# Patient Record
Sex: Female | Born: 1950 | Race: Black or African American | Hispanic: No | Smoking: Never smoker
Health system: Southern US, Community
[De-identification: ages and names within clinical notes are randomized; demographics above are authoritative.]

## PROBLEM LIST (undated history)

## (undated) DIAGNOSIS — R079 Chest pain, unspecified: Secondary | ICD-10-CM

## (undated) DIAGNOSIS — M51369 Other intervertebral disc degeneration, lumbar region without mention of lumbar back pain or lower extremity pain: Secondary | ICD-10-CM

## (undated) DIAGNOSIS — E785 Hyperlipidemia, unspecified: Secondary | ICD-10-CM

## (undated) DIAGNOSIS — M545 Low back pain, unspecified: Secondary | ICD-10-CM

## (undated) DIAGNOSIS — G4733 Obstructive sleep apnea (adult) (pediatric): Secondary | ICD-10-CM

## (undated) DIAGNOSIS — R2681 Unsteadiness on feet: Secondary | ICD-10-CM

## (undated) DIAGNOSIS — E119 Type 2 diabetes mellitus without complications: Secondary | ICD-10-CM

## (undated) DIAGNOSIS — K219 Gastro-esophageal reflux disease without esophagitis: Secondary | ICD-10-CM

## (undated) DIAGNOSIS — G8929 Other chronic pain: Secondary | ICD-10-CM

## (undated) DIAGNOSIS — M5136 Other intervertebral disc degeneration, lumbar region: Secondary | ICD-10-CM

## (undated) DIAGNOSIS — Z87442 Personal history of urinary calculi: Secondary | ICD-10-CM

## (undated) DIAGNOSIS — I2510 Atherosclerotic heart disease of native coronary artery without angina pectoris: Secondary | ICD-10-CM

## (undated) DIAGNOSIS — I639 Cerebral infarction, unspecified: Secondary | ICD-10-CM

## (undated) DIAGNOSIS — F32A Depression, unspecified: Secondary | ICD-10-CM

## (undated) DIAGNOSIS — F4310 Post-traumatic stress disorder, unspecified: Secondary | ICD-10-CM

## (undated) DIAGNOSIS — Z9981 Dependence on supplemental oxygen: Secondary | ICD-10-CM

## (undated) DIAGNOSIS — D509 Iron deficiency anemia, unspecified: Secondary | ICD-10-CM

## (undated) DIAGNOSIS — Z8711 Personal history of peptic ulcer disease: Secondary | ICD-10-CM

## (undated) DIAGNOSIS — I480 Paroxysmal atrial fibrillation: Secondary | ICD-10-CM

## (undated) DIAGNOSIS — T4145XA Adverse effect of unspecified anesthetic, initial encounter: Secondary | ICD-10-CM

## (undated) DIAGNOSIS — T8859XA Other complications of anesthesia, initial encounter: Secondary | ICD-10-CM

## (undated) DIAGNOSIS — K589 Irritable bowel syndrome without diarrhea: Secondary | ICD-10-CM

## (undated) DIAGNOSIS — E669 Obesity, unspecified: Secondary | ICD-10-CM

## (undated) DIAGNOSIS — Z8719 Personal history of other diseases of the digestive system: Secondary | ICD-10-CM

## (undated) DIAGNOSIS — J449 Chronic obstructive pulmonary disease, unspecified: Secondary | ICD-10-CM

## (undated) DIAGNOSIS — I10 Essential (primary) hypertension: Secondary | ICD-10-CM

## (undated) DIAGNOSIS — M1990 Unspecified osteoarthritis, unspecified site: Secondary | ICD-10-CM

## (undated) DIAGNOSIS — F329 Major depressive disorder, single episode, unspecified: Secondary | ICD-10-CM

## (undated) DIAGNOSIS — G5601 Carpal tunnel syndrome, right upper limb: Secondary | ICD-10-CM

## (undated) DIAGNOSIS — G459 Transient cerebral ischemic attack, unspecified: Secondary | ICD-10-CM

## (undated) DIAGNOSIS — N179 Acute kidney failure, unspecified: Secondary | ICD-10-CM

## (undated) DIAGNOSIS — Z9989 Dependence on other enabling machines and devices: Secondary | ICD-10-CM

## (undated) DIAGNOSIS — R32 Unspecified urinary incontinence: Secondary | ICD-10-CM

## (undated) HISTORY — DX: Atherosclerotic heart disease of native coronary artery without angina pectoris: I25.10

## (undated) HISTORY — DX: Type 2 diabetes mellitus without complications: E11.9

## (undated) HISTORY — PX: CARDIAC CATHETERIZATION: SHX172

## (undated) HISTORY — DX: Post-traumatic stress disorder, unspecified: F43.10

## (undated) HISTORY — DX: Iron deficiency anemia, unspecified: D50.9

## (undated) HISTORY — PX: CYSTOSCOPY W/ STONE MANIPULATION: SHX1427

## (undated) HISTORY — DX: Hyperlipidemia, unspecified: E78.5

## (undated) HISTORY — DX: Chest pain, unspecified: R07.9

## (undated) HISTORY — DX: Paroxysmal atrial fibrillation: I48.0

## (undated) HISTORY — DX: Major depressive disorder, single episode, unspecified: F32.9

## (undated) HISTORY — DX: Carpal tunnel syndrome, right upper limb: G56.01

## (undated) HISTORY — PX: CHOLECYSTECTOMY: SHX55

## (undated) HISTORY — PX: CYST EXCISION: SHX5701

## (undated) HISTORY — DX: Essential (primary) hypertension: I10

## (undated) HISTORY — DX: Unsteadiness on feet: R26.81

## (undated) HISTORY — PX: EXCISIONAL HEMORRHOIDECTOMY: SHX1541

## (undated) HISTORY — PX: CORONARY ANGIOPLASTY WITH STENT PLACEMENT: SHX49

## (undated) HISTORY — DX: Depression, unspecified: F32.A

## (undated) HISTORY — DX: Obesity, unspecified: E66.9

## (undated) HISTORY — DX: Unspecified osteoarthritis, unspecified site: M19.90

---

## 2000-11-22 ENCOUNTER — Observation Stay (HOSPITAL_COMMUNITY): Admission: EM | Admit: 2000-11-22 | Discharge: 2000-11-23 | Payer: Self-pay | Admitting: Emergency Medicine

## 2000-11-22 ENCOUNTER — Encounter: Payer: Self-pay | Admitting: Surgery

## 2000-12-06 ENCOUNTER — Emergency Department (HOSPITAL_COMMUNITY): Admission: EM | Admit: 2000-12-06 | Discharge: 2000-12-07 | Payer: Self-pay | Admitting: Emergency Medicine

## 2000-12-07 ENCOUNTER — Encounter: Payer: Self-pay | Admitting: *Deleted

## 2000-12-12 ENCOUNTER — Encounter: Payer: Self-pay | Admitting: Emergency Medicine

## 2000-12-12 ENCOUNTER — Emergency Department (HOSPITAL_COMMUNITY): Admission: EM | Admit: 2000-12-12 | Discharge: 2000-12-12 | Payer: Self-pay | Admitting: Emergency Medicine

## 2000-12-23 ENCOUNTER — Encounter: Payer: Self-pay | Admitting: Emergency Medicine

## 2000-12-23 ENCOUNTER — Inpatient Hospital Stay (HOSPITAL_COMMUNITY): Admission: EM | Admit: 2000-12-23 | Discharge: 2000-12-25 | Payer: Self-pay | Admitting: Emergency Medicine

## 2000-12-29 ENCOUNTER — Encounter: Admission: RE | Admit: 2000-12-29 | Discharge: 2000-12-29 | Payer: Self-pay | Admitting: Internal Medicine

## 2001-01-09 ENCOUNTER — Ambulatory Visit (HOSPITAL_COMMUNITY): Admission: RE | Admit: 2001-01-09 | Discharge: 2001-01-09 | Payer: Self-pay | Admitting: *Deleted

## 2001-01-20 ENCOUNTER — Ambulatory Visit (HOSPITAL_COMMUNITY): Admission: RE | Admit: 2001-01-20 | Discharge: 2001-01-20 | Payer: Self-pay | Admitting: *Deleted

## 2001-01-20 ENCOUNTER — Encounter: Payer: Self-pay | Admitting: *Deleted

## 2001-02-08 ENCOUNTER — Encounter: Payer: Self-pay | Admitting: Internal Medicine

## 2001-02-08 ENCOUNTER — Ambulatory Visit (HOSPITAL_BASED_OUTPATIENT_CLINIC_OR_DEPARTMENT_OTHER): Admission: RE | Admit: 2001-02-08 | Discharge: 2001-02-08 | Payer: Self-pay | Admitting: *Deleted

## 2001-04-23 ENCOUNTER — Ambulatory Visit (HOSPITAL_BASED_OUTPATIENT_CLINIC_OR_DEPARTMENT_OTHER): Admission: RE | Admit: 2001-04-23 | Discharge: 2001-04-23 | Payer: Self-pay | Admitting: Internal Medicine

## 2001-04-23 ENCOUNTER — Encounter: Payer: Self-pay | Admitting: Internal Medicine

## 2002-04-02 ENCOUNTER — Ambulatory Visit (HOSPITAL_COMMUNITY): Admission: RE | Admit: 2002-04-02 | Discharge: 2002-04-02 | Payer: Self-pay | Admitting: Gastroenterology

## 2002-04-17 ENCOUNTER — Encounter: Admission: RE | Admit: 2002-04-17 | Discharge: 2002-04-17 | Payer: Self-pay | Admitting: Gastroenterology

## 2002-04-17 ENCOUNTER — Encounter: Payer: Self-pay | Admitting: Gastroenterology

## 2002-06-19 ENCOUNTER — Encounter: Payer: Self-pay | Admitting: General Surgery

## 2002-06-26 ENCOUNTER — Observation Stay (HOSPITAL_COMMUNITY): Admission: RE | Admit: 2002-06-26 | Discharge: 2002-06-27 | Payer: Self-pay | Admitting: General Surgery

## 2002-06-26 ENCOUNTER — Encounter: Payer: Self-pay | Admitting: General Surgery

## 2002-06-26 ENCOUNTER — Encounter (INDEPENDENT_AMBULATORY_CARE_PROVIDER_SITE_OTHER): Payer: Self-pay | Admitting: *Deleted

## 2002-12-28 ENCOUNTER — Emergency Department (HOSPITAL_COMMUNITY): Admission: EM | Admit: 2002-12-28 | Discharge: 2002-12-28 | Payer: Self-pay | Admitting: Emergency Medicine

## 2002-12-28 ENCOUNTER — Encounter: Payer: Self-pay | Admitting: Emergency Medicine

## 2003-10-10 ENCOUNTER — Ambulatory Visit (HOSPITAL_COMMUNITY): Admission: RE | Admit: 2003-10-10 | Discharge: 2003-10-10 | Payer: Self-pay | Admitting: Internal Medicine

## 2003-11-17 ENCOUNTER — Encounter (HOSPITAL_COMMUNITY): Admission: RE | Admit: 2003-11-17 | Discharge: 2004-02-15 | Payer: Self-pay | Admitting: Internal Medicine

## 2003-11-25 ENCOUNTER — Ambulatory Visit (HOSPITAL_COMMUNITY): Admission: RE | Admit: 2003-11-25 | Discharge: 2003-11-25 | Payer: Self-pay | Admitting: Emergency Medicine

## 2004-01-02 ENCOUNTER — Ambulatory Visit (HOSPITAL_COMMUNITY): Admission: RE | Admit: 2004-01-02 | Discharge: 2004-01-02 | Payer: Self-pay | Admitting: Emergency Medicine

## 2004-02-18 ENCOUNTER — Encounter (HOSPITAL_COMMUNITY): Admission: RE | Admit: 2004-02-18 | Discharge: 2004-05-18 | Payer: Self-pay | Admitting: Internal Medicine

## 2004-05-19 ENCOUNTER — Encounter (HOSPITAL_COMMUNITY): Admission: RE | Admit: 2004-05-19 | Discharge: 2004-08-17 | Payer: Self-pay | Admitting: Internal Medicine

## 2004-07-28 ENCOUNTER — Ambulatory Visit: Payer: Self-pay | Admitting: Internal Medicine

## 2004-08-04 ENCOUNTER — Ambulatory Visit: Payer: Self-pay | Admitting: Internal Medicine

## 2004-08-11 ENCOUNTER — Ambulatory Visit: Payer: Self-pay | Admitting: Internal Medicine

## 2004-08-18 ENCOUNTER — Ambulatory Visit: Payer: Self-pay | Admitting: Internal Medicine

## 2004-08-19 ENCOUNTER — Encounter (HOSPITAL_COMMUNITY): Admission: RE | Admit: 2004-08-19 | Discharge: 2004-11-17 | Payer: Self-pay | Admitting: Internal Medicine

## 2004-08-25 ENCOUNTER — Ambulatory Visit: Payer: Self-pay | Admitting: Internal Medicine

## 2004-09-01 ENCOUNTER — Ambulatory Visit: Payer: Self-pay | Admitting: Internal Medicine

## 2004-09-08 ENCOUNTER — Ambulatory Visit: Payer: Self-pay | Admitting: Internal Medicine

## 2004-09-22 ENCOUNTER — Ambulatory Visit: Payer: Self-pay | Admitting: Internal Medicine

## 2004-09-29 ENCOUNTER — Ambulatory Visit: Payer: Self-pay | Admitting: Internal Medicine

## 2004-10-06 ENCOUNTER — Ambulatory Visit: Payer: Self-pay | Admitting: Internal Medicine

## 2004-10-13 ENCOUNTER — Ambulatory Visit: Payer: Self-pay | Admitting: Internal Medicine

## 2004-10-19 ENCOUNTER — Ambulatory Visit: Payer: Self-pay | Admitting: Internal Medicine

## 2004-10-27 ENCOUNTER — Ambulatory Visit: Payer: Self-pay | Admitting: Internal Medicine

## 2004-11-10 ENCOUNTER — Ambulatory Visit: Payer: Self-pay | Admitting: Internal Medicine

## 2004-11-17 ENCOUNTER — Ambulatory Visit: Payer: Self-pay | Admitting: Internal Medicine

## 2004-11-19 ENCOUNTER — Encounter (HOSPITAL_COMMUNITY): Admission: RE | Admit: 2004-11-19 | Discharge: 2005-02-17 | Payer: Self-pay | Admitting: Internal Medicine

## 2004-11-24 ENCOUNTER — Ambulatory Visit: Payer: Self-pay | Admitting: Internal Medicine

## 2004-12-01 ENCOUNTER — Ambulatory Visit: Payer: Self-pay | Admitting: Internal Medicine

## 2004-12-08 ENCOUNTER — Ambulatory Visit: Payer: Self-pay | Admitting: Internal Medicine

## 2004-12-15 ENCOUNTER — Ambulatory Visit: Payer: Self-pay | Admitting: Internal Medicine

## 2004-12-22 ENCOUNTER — Ambulatory Visit: Payer: Self-pay | Admitting: Internal Medicine

## 2004-12-29 ENCOUNTER — Ambulatory Visit: Payer: Self-pay | Admitting: Internal Medicine

## 2005-02-02 ENCOUNTER — Ambulatory Visit: Payer: Self-pay | Admitting: Internal Medicine

## 2005-02-09 ENCOUNTER — Ambulatory Visit: Payer: Self-pay | Admitting: Internal Medicine

## 2005-02-15 ENCOUNTER — Ambulatory Visit: Payer: Self-pay | Admitting: Internal Medicine

## 2005-02-18 ENCOUNTER — Encounter (HOSPITAL_COMMUNITY): Admission: RE | Admit: 2005-02-18 | Discharge: 2005-05-19 | Payer: Self-pay | Admitting: Internal Medicine

## 2005-02-23 ENCOUNTER — Ambulatory Visit: Payer: Self-pay | Admitting: Internal Medicine

## 2005-03-02 ENCOUNTER — Ambulatory Visit: Payer: Self-pay | Admitting: Internal Medicine

## 2005-03-09 ENCOUNTER — Ambulatory Visit: Payer: Self-pay | Admitting: Internal Medicine

## 2005-03-16 ENCOUNTER — Ambulatory Visit: Payer: Self-pay | Admitting: Internal Medicine

## 2005-03-17 ENCOUNTER — Ambulatory Visit: Payer: Self-pay | Admitting: Internal Medicine

## 2005-03-23 ENCOUNTER — Ambulatory Visit: Payer: Self-pay | Admitting: Internal Medicine

## 2005-03-30 ENCOUNTER — Ambulatory Visit: Payer: Self-pay | Admitting: Internal Medicine

## 2005-04-13 ENCOUNTER — Ambulatory Visit: Payer: Self-pay | Admitting: Internal Medicine

## 2005-04-20 ENCOUNTER — Ambulatory Visit: Payer: Self-pay | Admitting: Internal Medicine

## 2005-04-27 ENCOUNTER — Ambulatory Visit: Payer: Self-pay | Admitting: Internal Medicine

## 2005-05-18 ENCOUNTER — Ambulatory Visit: Payer: Self-pay | Admitting: Internal Medicine

## 2005-06-01 ENCOUNTER — Ambulatory Visit: Payer: Self-pay | Admitting: Internal Medicine

## 2005-06-15 ENCOUNTER — Ambulatory Visit: Payer: Self-pay | Admitting: Internal Medicine

## 2005-06-22 ENCOUNTER — Ambulatory Visit: Payer: Self-pay | Admitting: Internal Medicine

## 2005-06-23 ENCOUNTER — Ambulatory Visit: Payer: Self-pay | Admitting: Internal Medicine

## 2005-06-29 ENCOUNTER — Ambulatory Visit: Payer: Self-pay | Admitting: Internal Medicine

## 2005-07-06 ENCOUNTER — Ambulatory Visit: Payer: Self-pay | Admitting: Internal Medicine

## 2005-07-20 ENCOUNTER — Ambulatory Visit: Payer: Self-pay | Admitting: Internal Medicine

## 2005-07-27 ENCOUNTER — Ambulatory Visit: Payer: Self-pay | Admitting: Internal Medicine

## 2005-08-03 ENCOUNTER — Ambulatory Visit: Payer: Self-pay | Admitting: Internal Medicine

## 2005-08-10 ENCOUNTER — Ambulatory Visit: Payer: Self-pay | Admitting: Internal Medicine

## 2005-08-17 ENCOUNTER — Ambulatory Visit: Payer: Self-pay | Admitting: Internal Medicine

## 2005-08-24 ENCOUNTER — Ambulatory Visit: Payer: Self-pay | Admitting: Internal Medicine

## 2005-09-21 ENCOUNTER — Ambulatory Visit: Payer: Self-pay | Admitting: Internal Medicine

## 2005-09-28 ENCOUNTER — Ambulatory Visit: Payer: Self-pay | Admitting: Internal Medicine

## 2005-10-05 ENCOUNTER — Ambulatory Visit: Payer: Self-pay | Admitting: Internal Medicine

## 2005-10-12 ENCOUNTER — Ambulatory Visit: Payer: Self-pay | Admitting: Internal Medicine

## 2005-10-13 ENCOUNTER — Ambulatory Visit: Payer: Self-pay | Admitting: Internal Medicine

## 2005-10-19 ENCOUNTER — Ambulatory Visit: Payer: Self-pay | Admitting: Internal Medicine

## 2005-10-26 ENCOUNTER — Ambulatory Visit: Payer: Self-pay | Admitting: Internal Medicine

## 2005-11-02 ENCOUNTER — Ambulatory Visit: Payer: Self-pay | Admitting: Internal Medicine

## 2005-11-09 ENCOUNTER — Ambulatory Visit: Payer: Self-pay | Admitting: Internal Medicine

## 2005-11-16 ENCOUNTER — Ambulatory Visit: Payer: Self-pay | Admitting: Internal Medicine

## 2005-11-23 ENCOUNTER — Ambulatory Visit: Payer: Self-pay | Admitting: Internal Medicine

## 2005-11-30 ENCOUNTER — Ambulatory Visit: Payer: Self-pay | Admitting: Internal Medicine

## 2005-12-07 ENCOUNTER — Ambulatory Visit: Payer: Self-pay | Admitting: Internal Medicine

## 2005-12-14 ENCOUNTER — Ambulatory Visit: Payer: Self-pay | Admitting: Internal Medicine

## 2005-12-21 ENCOUNTER — Ambulatory Visit: Payer: Self-pay | Admitting: Internal Medicine

## 2006-01-04 ENCOUNTER — Ambulatory Visit: Payer: Self-pay | Admitting: Internal Medicine

## 2006-01-11 ENCOUNTER — Ambulatory Visit: Payer: Self-pay | Admitting: Internal Medicine

## 2006-01-18 ENCOUNTER — Ambulatory Visit: Payer: Self-pay | Admitting: Internal Medicine

## 2006-01-25 ENCOUNTER — Ambulatory Visit: Payer: Self-pay | Admitting: Internal Medicine

## 2006-02-01 ENCOUNTER — Ambulatory Visit: Payer: Self-pay | Admitting: Internal Medicine

## 2006-02-15 ENCOUNTER — Ambulatory Visit: Payer: Self-pay | Admitting: Internal Medicine

## 2006-02-22 ENCOUNTER — Ambulatory Visit: Payer: Self-pay | Admitting: Internal Medicine

## 2006-02-23 ENCOUNTER — Ambulatory Visit: Payer: Self-pay | Admitting: Internal Medicine

## 2006-03-01 ENCOUNTER — Ambulatory Visit: Payer: Self-pay | Admitting: Internal Medicine

## 2006-03-08 ENCOUNTER — Ambulatory Visit: Payer: Self-pay | Admitting: Internal Medicine

## 2006-03-15 ENCOUNTER — Ambulatory Visit: Payer: Self-pay | Admitting: Internal Medicine

## 2006-03-22 ENCOUNTER — Ambulatory Visit: Payer: Self-pay | Admitting: Internal Medicine

## 2006-03-29 ENCOUNTER — Ambulatory Visit: Payer: Self-pay | Admitting: Internal Medicine

## 2006-04-05 ENCOUNTER — Ambulatory Visit: Payer: Self-pay | Admitting: Internal Medicine

## 2006-04-12 ENCOUNTER — Ambulatory Visit: Payer: Self-pay | Admitting: Internal Medicine

## 2006-04-19 ENCOUNTER — Ambulatory Visit: Payer: Self-pay | Admitting: Internal Medicine

## 2006-04-26 ENCOUNTER — Ambulatory Visit: Payer: Self-pay | Admitting: Internal Medicine

## 2006-05-03 ENCOUNTER — Ambulatory Visit: Payer: Self-pay | Admitting: Internal Medicine

## 2006-05-12 ENCOUNTER — Ambulatory Visit: Payer: Self-pay | Admitting: Internal Medicine

## 2006-05-17 ENCOUNTER — Ambulatory Visit: Payer: Self-pay | Admitting: Internal Medicine

## 2006-05-24 ENCOUNTER — Ambulatory Visit: Payer: Self-pay | Admitting: Internal Medicine

## 2006-05-31 ENCOUNTER — Ambulatory Visit: Payer: Self-pay | Admitting: Internal Medicine

## 2006-06-07 ENCOUNTER — Ambulatory Visit: Payer: Self-pay | Admitting: Internal Medicine

## 2006-06-14 ENCOUNTER — Ambulatory Visit: Payer: Self-pay | Admitting: Internal Medicine

## 2006-06-21 ENCOUNTER — Ambulatory Visit: Payer: Self-pay | Admitting: Internal Medicine

## 2006-06-28 ENCOUNTER — Ambulatory Visit: Payer: Self-pay | Admitting: Internal Medicine

## 2006-06-29 ENCOUNTER — Ambulatory Visit: Payer: Self-pay | Admitting: Internal Medicine

## 2006-07-05 ENCOUNTER — Ambulatory Visit: Payer: Self-pay | Admitting: Internal Medicine

## 2006-07-26 ENCOUNTER — Ambulatory Visit: Payer: Self-pay | Admitting: Internal Medicine

## 2006-08-02 ENCOUNTER — Ambulatory Visit: Payer: Self-pay | Admitting: Internal Medicine

## 2006-08-10 ENCOUNTER — Ambulatory Visit: Payer: Self-pay | Admitting: Internal Medicine

## 2006-08-16 ENCOUNTER — Ambulatory Visit: Payer: Self-pay | Admitting: Internal Medicine

## 2006-08-25 ENCOUNTER — Ambulatory Visit: Payer: Self-pay | Admitting: Internal Medicine

## 2006-08-30 ENCOUNTER — Ambulatory Visit: Payer: Self-pay | Admitting: Internal Medicine

## 2006-09-06 ENCOUNTER — Ambulatory Visit: Payer: Self-pay | Admitting: Internal Medicine

## 2006-09-14 ENCOUNTER — Ambulatory Visit: Payer: Self-pay | Admitting: Internal Medicine

## 2006-09-20 ENCOUNTER — Ambulatory Visit: Payer: Self-pay | Admitting: Internal Medicine

## 2006-09-27 ENCOUNTER — Ambulatory Visit: Payer: Self-pay | Admitting: Internal Medicine

## 2006-10-18 ENCOUNTER — Ambulatory Visit: Payer: Self-pay | Admitting: Internal Medicine

## 2006-10-26 ENCOUNTER — Ambulatory Visit: Payer: Self-pay | Admitting: Internal Medicine

## 2006-11-01 ENCOUNTER — Ambulatory Visit: Payer: Self-pay | Admitting: Internal Medicine

## 2006-11-08 ENCOUNTER — Ambulatory Visit: Payer: Self-pay | Admitting: Internal Medicine

## 2006-11-15 ENCOUNTER — Ambulatory Visit: Payer: Self-pay | Admitting: Internal Medicine

## 2006-11-19 ENCOUNTER — Ambulatory Visit (HOSPITAL_COMMUNITY): Admission: RE | Admit: 2006-11-19 | Discharge: 2006-11-19 | Payer: Self-pay | Admitting: Family Medicine

## 2006-11-22 ENCOUNTER — Ambulatory Visit: Payer: Self-pay | Admitting: Internal Medicine

## 2006-11-27 ENCOUNTER — Inpatient Hospital Stay (HOSPITAL_COMMUNITY): Admission: EM | Admit: 2006-11-27 | Discharge: 2006-12-02 | Payer: Self-pay | Admitting: Emergency Medicine

## 2006-11-27 ENCOUNTER — Ambulatory Visit: Payer: Self-pay | Admitting: Sports Medicine

## 2006-11-28 ENCOUNTER — Encounter (INDEPENDENT_AMBULATORY_CARE_PROVIDER_SITE_OTHER): Payer: Self-pay | Admitting: Interventional Cardiology

## 2006-12-08 ENCOUNTER — Ambulatory Visit: Payer: Self-pay | Admitting: Internal Medicine

## 2006-12-13 ENCOUNTER — Ambulatory Visit: Payer: Self-pay | Admitting: Internal Medicine

## 2006-12-19 ENCOUNTER — Ambulatory Visit: Payer: Self-pay | Admitting: Internal Medicine

## 2007-01-03 ENCOUNTER — Ambulatory Visit: Payer: Self-pay | Admitting: Internal Medicine

## 2007-01-12 ENCOUNTER — Ambulatory Visit: Payer: Self-pay | Admitting: Internal Medicine

## 2007-01-17 ENCOUNTER — Ambulatory Visit: Payer: Self-pay | Admitting: Internal Medicine

## 2007-01-24 ENCOUNTER — Ambulatory Visit: Payer: Self-pay | Admitting: Internal Medicine

## 2007-01-29 ENCOUNTER — Inpatient Hospital Stay (HOSPITAL_COMMUNITY): Admission: EM | Admit: 2007-01-29 | Discharge: 2007-02-02 | Payer: Self-pay | Admitting: Emergency Medicine

## 2007-01-30 ENCOUNTER — Encounter (INDEPENDENT_AMBULATORY_CARE_PROVIDER_SITE_OTHER): Payer: Self-pay | Admitting: Emergency Medicine

## 2007-01-30 DIAGNOSIS — I2510 Atherosclerotic heart disease of native coronary artery without angina pectoris: Secondary | ICD-10-CM

## 2007-01-30 HISTORY — DX: Atherosclerotic heart disease of native coronary artery without angina pectoris: I25.10

## 2007-02-07 ENCOUNTER — Ambulatory Visit: Payer: Self-pay | Admitting: Internal Medicine

## 2007-02-14 ENCOUNTER — Ambulatory Visit: Payer: Self-pay | Admitting: Internal Medicine

## 2007-02-16 ENCOUNTER — Encounter (HOSPITAL_COMMUNITY): Admission: RE | Admit: 2007-02-16 | Discharge: 2007-05-17 | Payer: Self-pay | Admitting: *Deleted

## 2007-02-21 ENCOUNTER — Ambulatory Visit: Payer: Self-pay | Admitting: Internal Medicine

## 2007-02-28 ENCOUNTER — Ambulatory Visit: Payer: Self-pay | Admitting: Internal Medicine

## 2007-03-07 ENCOUNTER — Ambulatory Visit: Payer: Self-pay | Admitting: Internal Medicine

## 2007-03-15 ENCOUNTER — Ambulatory Visit: Payer: Self-pay | Admitting: Internal Medicine

## 2007-04-04 ENCOUNTER — Ambulatory Visit: Payer: Self-pay | Admitting: Internal Medicine

## 2007-04-12 ENCOUNTER — Ambulatory Visit: Payer: Self-pay | Admitting: Internal Medicine

## 2007-04-20 ENCOUNTER — Ambulatory Visit: Payer: Self-pay | Admitting: Internal Medicine

## 2007-04-26 ENCOUNTER — Ambulatory Visit: Payer: Self-pay | Admitting: Internal Medicine

## 2007-04-27 ENCOUNTER — Ambulatory Visit: Payer: Self-pay | Admitting: Internal Medicine

## 2007-05-10 DIAGNOSIS — J4520 Mild intermittent asthma, uncomplicated: Secondary | ICD-10-CM | POA: Insufficient documentation

## 2007-05-10 DIAGNOSIS — K219 Gastro-esophageal reflux disease without esophagitis: Secondary | ICD-10-CM | POA: Insufficient documentation

## 2007-05-10 DIAGNOSIS — I10 Essential (primary) hypertension: Secondary | ICD-10-CM | POA: Insufficient documentation

## 2007-05-10 DIAGNOSIS — I2510 Atherosclerotic heart disease of native coronary artery without angina pectoris: Secondary | ICD-10-CM | POA: Insufficient documentation

## 2007-05-10 DIAGNOSIS — J302 Other seasonal allergic rhinitis: Secondary | ICD-10-CM | POA: Insufficient documentation

## 2007-05-10 DIAGNOSIS — J3089 Other allergic rhinitis: Secondary | ICD-10-CM

## 2007-05-10 DIAGNOSIS — G4733 Obstructive sleep apnea (adult) (pediatric): Secondary | ICD-10-CM | POA: Insufficient documentation

## 2007-05-10 DIAGNOSIS — Z9861 Coronary angioplasty status: Secondary | ICD-10-CM

## 2007-05-11 ENCOUNTER — Ambulatory Visit: Payer: Self-pay | Admitting: Internal Medicine

## 2007-05-16 ENCOUNTER — Ambulatory Visit: Payer: Self-pay | Admitting: Internal Medicine

## 2007-05-18 ENCOUNTER — Encounter (HOSPITAL_COMMUNITY): Admission: RE | Admit: 2007-05-18 | Discharge: 2007-05-26 | Payer: Self-pay | Admitting: *Deleted

## 2007-05-26 ENCOUNTER — Inpatient Hospital Stay (HOSPITAL_COMMUNITY): Admission: EM | Admit: 2007-05-26 | Discharge: 2007-05-30 | Payer: Self-pay | Admitting: Emergency Medicine

## 2007-06-06 ENCOUNTER — Ambulatory Visit: Payer: Self-pay | Admitting: Internal Medicine

## 2007-06-08 ENCOUNTER — Ambulatory Visit: Payer: Self-pay | Admitting: Hematology and Oncology

## 2007-06-13 ENCOUNTER — Ambulatory Visit: Payer: Self-pay | Admitting: Internal Medicine

## 2007-06-19 ENCOUNTER — Ambulatory Visit: Payer: Self-pay | Admitting: Internal Medicine

## 2007-06-20 ENCOUNTER — Ambulatory Visit: Payer: Self-pay | Admitting: Internal Medicine

## 2007-06-23 LAB — CBC & DIFF AND RETIC
Basophils Absolute: 0.1 10*3/uL (ref 0.0–0.1)
HCT: 29.9 % — ABNORMAL LOW (ref 34.8–46.6)
HGB: 9.7 g/dL — ABNORMAL LOW (ref 11.6–15.9)
IRF: 0.35 — ABNORMAL HIGH (ref 0.130–0.330)
MONO#: 0.6 10*3/uL (ref 0.1–0.9)
NEUT#: 3.2 10*3/uL (ref 1.5–6.5)
NEUT%: 42.4 % (ref 39.6–76.8)
WBC: 7.6 10*3/uL (ref 3.9–10.0)
lymph#: 3.6 10*3/uL — ABNORMAL HIGH (ref 0.9–3.3)

## 2007-06-23 LAB — URINALYSIS, MICROSCOPIC - CHCC
Ketones: NEGATIVE mg/dL
Leukocyte Esterase: NEGATIVE
Nitrite: NEGATIVE
Protein: NEGATIVE mg/dL
WBC, UA: NEGATIVE (ref 0–2)

## 2007-06-23 LAB — MORPHOLOGY

## 2007-06-27 LAB — COMPREHENSIVE METABOLIC PANEL
Albumin: 4.1 g/dL (ref 3.5–5.2)
CO2: 28 mEq/L (ref 19–32)
Calcium: 9.4 mg/dL (ref 8.4–10.5)
Chloride: 102 mEq/L (ref 96–112)
Glucose, Bld: 114 mg/dL — ABNORMAL HIGH (ref 70–99)
Potassium: 4.1 mEq/L (ref 3.5–5.3)
Sodium: 141 mEq/L (ref 135–145)
Total Bilirubin: 0.3 mg/dL (ref 0.3–1.2)
Total Protein: 7.3 g/dL (ref 6.0–8.3)

## 2007-06-27 LAB — DIRECT ANTIGLOBULIN TEST (NOT AT ARMC)
DAT (Complement): NEGATIVE
DAT IgG: NEGATIVE

## 2007-06-27 LAB — HEMOGLOBINOPATHY EVALUATION
Hgb A2 Quant: 2.1 % — ABNORMAL LOW (ref 2.2–3.2)
Hgb A: 97.9 % — ABNORMAL HIGH (ref 96.8–97.8)

## 2007-06-27 LAB — HAPTOGLOBIN: Haptoglobin: 137 mg/dL (ref 16–200)

## 2007-06-27 LAB — PROTEIN ELECTROPHORESIS, SERUM
Alpha-1-Globulin: 4.4 % (ref 2.9–4.9)
Alpha-2-Globulin: 11.6 % (ref 7.1–11.8)
Beta Globulin: 7 % (ref 4.7–7.2)
Gamma Globulin: 15.2 % (ref 11.1–18.8)

## 2007-06-27 LAB — IRON AND TIBC
Iron: 30 ug/dL — ABNORMAL LOW (ref 42–145)
UIBC: 315 ug/dL

## 2007-06-27 LAB — VITAMIN B12: Vitamin B-12: 705 pg/mL (ref 211–911)

## 2007-07-05 ENCOUNTER — Ambulatory Visit: Payer: Self-pay | Admitting: Internal Medicine

## 2007-07-11 ENCOUNTER — Ambulatory Visit: Payer: Self-pay | Admitting: Internal Medicine

## 2007-07-19 ENCOUNTER — Ambulatory Visit: Payer: Self-pay | Admitting: Hematology and Oncology

## 2007-07-25 ENCOUNTER — Ambulatory Visit: Payer: Self-pay | Admitting: Internal Medicine

## 2007-07-25 LAB — BASIC METABOLIC PANEL
CO2: 26 mEq/L (ref 19–32)
Glucose, Bld: 108 mg/dL — ABNORMAL HIGH (ref 70–99)
Potassium: 4.1 mEq/L (ref 3.5–5.3)
Sodium: 142 mEq/L (ref 135–145)

## 2007-07-25 LAB — CBC WITH DIFFERENTIAL/PLATELET
Eosinophils Absolute: 0.3 10*3/uL (ref 0.0–0.5)
HGB: 10.8 g/dL — ABNORMAL LOW (ref 11.6–15.9)
LYMPH%: 38.4 % (ref 14.0–48.0)
MONO#: 0.6 10*3/uL (ref 0.1–0.9)
NEUT#: 3.3 10*3/uL (ref 1.5–6.5)
Platelets: 427 10*3/uL — ABNORMAL HIGH (ref 145–400)
RBC: 4.93 10*6/uL (ref 3.70–5.32)
RDW: 21.7 % — ABNORMAL HIGH (ref 11.3–14.5)
WBC: 7.3 10*3/uL (ref 3.9–10.0)

## 2007-07-25 LAB — FERRITIN: Ferritin: 807 ng/mL — ABNORMAL HIGH (ref 10–291)

## 2007-07-25 LAB — IRON AND TIBC: %SAT: 20 % (ref 20–55)

## 2007-08-01 ENCOUNTER — Ambulatory Visit: Payer: Self-pay | Admitting: Internal Medicine

## 2007-08-22 ENCOUNTER — Ambulatory Visit: Payer: Self-pay | Admitting: Internal Medicine

## 2007-08-29 ENCOUNTER — Ambulatory Visit: Payer: Self-pay | Admitting: Internal Medicine

## 2007-09-05 ENCOUNTER — Ambulatory Visit: Payer: Self-pay | Admitting: Internal Medicine

## 2007-09-13 ENCOUNTER — Ambulatory Visit: Payer: Self-pay | Admitting: Internal Medicine

## 2007-09-26 ENCOUNTER — Ambulatory Visit: Payer: Self-pay | Admitting: Internal Medicine

## 2007-10-03 ENCOUNTER — Ambulatory Visit: Payer: Self-pay | Admitting: Internal Medicine

## 2007-10-04 ENCOUNTER — Ambulatory Visit: Payer: Self-pay | Admitting: Hematology and Oncology

## 2007-10-06 LAB — CBC WITH DIFFERENTIAL/PLATELET
Eosinophils Absolute: 0.2 10*3/uL (ref 0.0–0.5)
MCV: 68.3 fL — ABNORMAL LOW (ref 81.0–101.0)
MONO#: 0.8 10*3/uL (ref 0.1–0.9)
MONO%: 10.3 % (ref 0.0–13.0)
NEUT#: 2.9 10*3/uL (ref 1.5–6.5)
RBC: 5.1 10*6/uL (ref 3.70–5.32)
RDW: 17 % — ABNORMAL HIGH (ref 11.3–14.5)
WBC: 7.5 10*3/uL (ref 3.9–10.0)

## 2007-10-06 LAB — IRON AND TIBC
%SAT: 23 % (ref 20–55)
Iron: 65 ug/dL (ref 42–145)

## 2007-10-06 LAB — BASIC METABOLIC PANEL
Glucose, Bld: 97 mg/dL (ref 70–99)
Potassium: 3.9 mEq/L (ref 3.5–5.3)
Sodium: 140 mEq/L (ref 135–145)

## 2007-10-06 LAB — FERRITIN: Ferritin: 363 ng/mL — ABNORMAL HIGH (ref 10–291)

## 2007-10-10 ENCOUNTER — Ambulatory Visit: Payer: Self-pay | Admitting: Internal Medicine

## 2007-10-11 ENCOUNTER — Encounter (INDEPENDENT_AMBULATORY_CARE_PROVIDER_SITE_OTHER): Payer: Self-pay | Admitting: Gastroenterology

## 2007-10-11 ENCOUNTER — Ambulatory Visit (HOSPITAL_COMMUNITY): Admission: RE | Admit: 2007-10-11 | Discharge: 2007-10-11 | Payer: Self-pay | Admitting: Physician Assistant

## 2007-10-11 LAB — HM COLONOSCOPY: HM Colonoscopy: NORMAL

## 2007-10-17 ENCOUNTER — Ambulatory Visit: Payer: Self-pay | Admitting: Internal Medicine

## 2007-10-26 ENCOUNTER — Ambulatory Visit: Payer: Self-pay | Admitting: Internal Medicine

## 2007-10-31 ENCOUNTER — Ambulatory Visit: Payer: Self-pay | Admitting: Internal Medicine

## 2007-11-01 ENCOUNTER — Ambulatory Visit (HOSPITAL_COMMUNITY): Admission: RE | Admit: 2007-11-01 | Discharge: 2007-11-01 | Payer: Self-pay | Admitting: Gastroenterology

## 2007-11-08 ENCOUNTER — Ambulatory Visit: Payer: Self-pay | Admitting: Internal Medicine

## 2007-11-09 ENCOUNTER — Ambulatory Visit: Payer: Self-pay | Admitting: Internal Medicine

## 2007-11-14 ENCOUNTER — Ambulatory Visit: Payer: Self-pay | Admitting: Internal Medicine

## 2007-11-23 ENCOUNTER — Ambulatory Visit: Payer: Self-pay | Admitting: Internal Medicine

## 2007-11-28 ENCOUNTER — Ambulatory Visit: Payer: Self-pay | Admitting: Internal Medicine

## 2007-12-07 ENCOUNTER — Ambulatory Visit: Payer: Self-pay | Admitting: Internal Medicine

## 2007-12-07 ENCOUNTER — Ambulatory Visit: Payer: Self-pay | Admitting: Hematology and Oncology

## 2007-12-12 ENCOUNTER — Ambulatory Visit: Payer: Self-pay | Admitting: Internal Medicine

## 2007-12-18 ENCOUNTER — Ambulatory Visit: Payer: Self-pay | Admitting: Internal Medicine

## 2007-12-26 ENCOUNTER — Ambulatory Visit: Payer: Self-pay | Admitting: Internal Medicine

## 2008-01-02 ENCOUNTER — Ambulatory Visit: Payer: Self-pay | Admitting: Internal Medicine

## 2008-01-16 ENCOUNTER — Ambulatory Visit: Payer: Self-pay | Admitting: Internal Medicine

## 2008-01-23 ENCOUNTER — Ambulatory Visit: Payer: Self-pay | Admitting: Internal Medicine

## 2008-01-31 ENCOUNTER — Ambulatory Visit: Payer: Self-pay | Admitting: Internal Medicine

## 2008-02-06 ENCOUNTER — Ambulatory Visit: Payer: Self-pay | Admitting: Internal Medicine

## 2008-02-15 ENCOUNTER — Ambulatory Visit: Payer: Self-pay | Admitting: Internal Medicine

## 2008-02-20 ENCOUNTER — Ambulatory Visit: Payer: Self-pay | Admitting: Internal Medicine

## 2008-02-27 ENCOUNTER — Ambulatory Visit: Payer: Self-pay | Admitting: Internal Medicine

## 2008-02-28 ENCOUNTER — Other Ambulatory Visit (HOSPITAL_COMMUNITY): Admission: RE | Admit: 2008-02-28 | Discharge: 2008-03-29 | Payer: Self-pay | Admitting: Psychiatry

## 2008-02-29 ENCOUNTER — Ambulatory Visit: Payer: Self-pay | Admitting: Psychiatry

## 2008-03-05 ENCOUNTER — Ambulatory Visit: Payer: Self-pay | Admitting: Internal Medicine

## 2008-03-12 ENCOUNTER — Ambulatory Visit: Payer: Self-pay | Admitting: Internal Medicine

## 2008-03-15 ENCOUNTER — Ambulatory Visit: Payer: Self-pay | Admitting: Hematology and Oncology

## 2008-03-19 ENCOUNTER — Ambulatory Visit: Payer: Self-pay | Admitting: Internal Medicine

## 2008-03-20 ENCOUNTER — Ambulatory Visit: Payer: Self-pay | Admitting: Internal Medicine

## 2008-03-20 LAB — BASIC METABOLIC PANEL
CO2: 29 mEq/L (ref 19–32)
Chloride: 98 mEq/L (ref 96–112)
Potassium: 3.9 mEq/L (ref 3.5–5.3)
Sodium: 138 mEq/L (ref 135–145)

## 2008-03-20 LAB — CBC WITH DIFFERENTIAL/PLATELET
BASO%: 0.3 % (ref 0.0–2.0)
MCHC: 32.3 g/dL (ref 32.0–36.0)
MONO#: 0.7 10*3/uL (ref 0.1–0.9)
NEUT#: 3.7 10*3/uL (ref 1.5–6.5)
RBC: 5.32 10*6/uL (ref 3.70–5.32)
WBC: 7.6 10*3/uL (ref 3.9–10.0)
lymph#: 3 10*3/uL (ref 0.9–3.3)

## 2008-03-20 LAB — FERRITIN: Ferritin: 512 ng/mL — ABNORMAL HIGH (ref 10–291)

## 2008-03-20 LAB — IRON AND TIBC
%SAT: 23 % (ref 20–55)
TIBC: 293 ug/dL (ref 250–470)

## 2008-03-27 ENCOUNTER — Ambulatory Visit: Payer: Self-pay | Admitting: Internal Medicine

## 2008-04-10 ENCOUNTER — Ambulatory Visit: Payer: Self-pay | Admitting: Internal Medicine

## 2008-04-17 ENCOUNTER — Ambulatory Visit (HOSPITAL_COMMUNITY): Payer: Self-pay | Admitting: Psychiatry

## 2008-04-24 ENCOUNTER — Ambulatory Visit: Payer: Self-pay | Admitting: Internal Medicine

## 2008-04-24 ENCOUNTER — Telehealth (INDEPENDENT_AMBULATORY_CARE_PROVIDER_SITE_OTHER): Payer: Self-pay | Admitting: *Deleted

## 2008-04-29 ENCOUNTER — Ambulatory Visit (HOSPITAL_COMMUNITY): Payer: Self-pay | Admitting: Psychiatry

## 2008-05-01 ENCOUNTER — Ambulatory Visit: Payer: Self-pay | Admitting: Internal Medicine

## 2008-05-31 ENCOUNTER — Ambulatory Visit (HOSPITAL_COMMUNITY): Payer: Self-pay | Admitting: Psychiatry

## 2008-06-02 ENCOUNTER — Encounter: Admission: RE | Admit: 2008-06-02 | Discharge: 2008-06-02 | Payer: Self-pay | Admitting: Specialist

## 2008-06-04 ENCOUNTER — Ambulatory Visit: Payer: Self-pay | Admitting: Internal Medicine

## 2008-06-04 ENCOUNTER — Ambulatory Visit (HOSPITAL_COMMUNITY): Admission: RE | Admit: 2008-06-04 | Discharge: 2008-06-04 | Payer: Self-pay | Admitting: Neurology

## 2008-06-18 ENCOUNTER — Ambulatory Visit: Payer: Self-pay | Admitting: Internal Medicine

## 2008-06-21 ENCOUNTER — Ambulatory Visit (HOSPITAL_COMMUNITY): Payer: Self-pay | Admitting: Psychiatry

## 2008-06-25 ENCOUNTER — Ambulatory Visit: Payer: Self-pay | Admitting: Internal Medicine

## 2008-07-01 ENCOUNTER — Encounter
Admission: RE | Admit: 2008-07-01 | Discharge: 2008-07-01 | Payer: Self-pay | Admitting: Physical Medicine and Rehabilitation

## 2008-07-24 ENCOUNTER — Ambulatory Visit: Payer: Self-pay | Admitting: Internal Medicine

## 2008-07-29 ENCOUNTER — Ambulatory Visit (HOSPITAL_COMMUNITY): Payer: Self-pay | Admitting: Psychology

## 2008-08-02 ENCOUNTER — Ambulatory Visit: Payer: Self-pay | Admitting: Internal Medicine

## 2008-08-07 ENCOUNTER — Ambulatory Visit: Payer: Self-pay | Admitting: Internal Medicine

## 2008-08-09 ENCOUNTER — Ambulatory Visit (HOSPITAL_COMMUNITY): Payer: Self-pay | Admitting: Psychiatry

## 2008-08-15 ENCOUNTER — Ambulatory Visit (HOSPITAL_COMMUNITY): Payer: Self-pay | Admitting: Psychology

## 2008-08-27 ENCOUNTER — Ambulatory Visit: Payer: Self-pay | Admitting: Internal Medicine

## 2008-09-05 ENCOUNTER — Ambulatory Visit (HOSPITAL_COMMUNITY): Payer: Self-pay | Admitting: Psychology

## 2008-09-17 ENCOUNTER — Ambulatory Visit: Payer: Self-pay | Admitting: Internal Medicine

## 2008-09-17 ENCOUNTER — Ambulatory Visit: Payer: Self-pay | Admitting: Hematology and Oncology

## 2008-09-19 LAB — CBC WITH DIFFERENTIAL/PLATELET
Basophils Absolute: 0.1 10*3/uL (ref 0.0–0.1)
EOS%: 1.9 % (ref 0.0–7.0)
HCT: 34.2 % — ABNORMAL LOW (ref 34.8–46.6)
HGB: 10.9 g/dL — ABNORMAL LOW (ref 11.6–15.9)
MCH: 21.5 pg — ABNORMAL LOW (ref 25.1–34.0)
MONO#: 0.7 10*3/uL (ref 0.1–0.9)
NEUT#: 4.6 10*3/uL (ref 1.5–6.5)
RDW: 16.8 % — ABNORMAL HIGH (ref 11.2–14.5)
WBC: 9.1 10*3/uL (ref 3.9–10.3)
lymph#: 3.6 10*3/uL — ABNORMAL HIGH (ref 0.9–3.3)

## 2008-09-19 LAB — IRON AND TIBC
%SAT: 12 % — ABNORMAL LOW (ref 20–55)
Iron: 37 ug/dL — ABNORMAL LOW (ref 42–145)
UIBC: 270 ug/dL

## 2008-09-19 LAB — BASIC METABOLIC PANEL
BUN: 14 mg/dL (ref 6–23)
CO2: 32 mEq/L (ref 19–32)
Chloride: 97 mEq/L (ref 96–112)
Potassium: 3.7 mEq/L (ref 3.5–5.3)

## 2008-09-24 ENCOUNTER — Ambulatory Visit (HOSPITAL_COMMUNITY): Payer: Self-pay | Admitting: Psychology

## 2008-09-24 ENCOUNTER — Ambulatory Visit: Payer: Self-pay | Admitting: Internal Medicine

## 2008-10-09 ENCOUNTER — Ambulatory Visit: Payer: Self-pay | Admitting: Internal Medicine

## 2008-10-15 ENCOUNTER — Ambulatory Visit: Payer: Self-pay | Admitting: Internal Medicine

## 2008-10-21 ENCOUNTER — Ambulatory Visit (HOSPITAL_COMMUNITY): Payer: Self-pay | Admitting: Psychiatry

## 2008-11-08 ENCOUNTER — Ambulatory Visit: Payer: Self-pay | Admitting: Internal Medicine

## 2008-11-12 ENCOUNTER — Ambulatory Visit: Payer: Self-pay | Admitting: Internal Medicine

## 2008-11-22 ENCOUNTER — Ambulatory Visit: Payer: Self-pay | Admitting: Internal Medicine

## 2008-11-26 ENCOUNTER — Ambulatory Visit: Payer: Self-pay | Admitting: Internal Medicine

## 2008-11-27 ENCOUNTER — Ambulatory Visit: Payer: Self-pay | Admitting: Internal Medicine

## 2008-12-12 ENCOUNTER — Ambulatory Visit: Payer: Self-pay | Admitting: Internal Medicine

## 2008-12-17 ENCOUNTER — Ambulatory Visit: Payer: Self-pay | Admitting: Internal Medicine

## 2008-12-18 ENCOUNTER — Ambulatory Visit (HOSPITAL_COMMUNITY): Payer: Self-pay | Admitting: Psychiatry

## 2008-12-24 ENCOUNTER — Ambulatory Visit: Payer: Self-pay | Admitting: Internal Medicine

## 2009-01-28 ENCOUNTER — Ambulatory Visit: Payer: Self-pay | Admitting: Internal Medicine

## 2009-01-30 ENCOUNTER — Emergency Department (HOSPITAL_COMMUNITY): Admission: EM | Admit: 2009-01-30 | Discharge: 2009-01-30 | Payer: Self-pay | Admitting: Emergency Medicine

## 2009-02-07 ENCOUNTER — Ambulatory Visit: Payer: Self-pay | Admitting: Internal Medicine

## 2009-02-12 ENCOUNTER — Ambulatory Visit: Payer: Self-pay | Admitting: Internal Medicine

## 2009-02-19 ENCOUNTER — Ambulatory Visit (HOSPITAL_COMMUNITY): Payer: Self-pay | Admitting: Psychiatry

## 2009-02-20 ENCOUNTER — Ambulatory Visit (HOSPITAL_COMMUNITY): Payer: Self-pay | Admitting: Psychology

## 2009-02-21 ENCOUNTER — Ambulatory Visit: Payer: Self-pay | Admitting: Internal Medicine

## 2009-02-26 ENCOUNTER — Ambulatory Visit: Payer: Self-pay | Admitting: Internal Medicine

## 2009-03-13 ENCOUNTER — Ambulatory Visit: Payer: Self-pay | Admitting: Internal Medicine

## 2009-03-17 ENCOUNTER — Ambulatory Visit (HOSPITAL_COMMUNITY): Payer: Self-pay | Admitting: Psychology

## 2009-03-21 ENCOUNTER — Ambulatory Visit: Payer: Self-pay | Admitting: Internal Medicine

## 2009-03-25 ENCOUNTER — Ambulatory Visit: Payer: Self-pay | Admitting: Hematology and Oncology

## 2009-03-27 LAB — CBC WITH DIFFERENTIAL/PLATELET
Basophils Absolute: 0.1 10*3/uL (ref 0.0–0.1)
EOS%: 1.9 % (ref 0.0–7.0)
Eosinophils Absolute: 0.2 10*3/uL (ref 0.0–0.5)
HCT: 34.5 % — ABNORMAL LOW (ref 34.8–46.6)
HGB: 10.8 g/dL — ABNORMAL LOW (ref 11.6–15.9)
LYMPH%: 36.5 % (ref 14.0–49.7)
MCH: 21.1 pg — ABNORMAL LOW (ref 25.1–34.0)
MCV: 67.5 fL — ABNORMAL LOW (ref 79.5–101.0)
MONO%: 7.6 % (ref 0.0–14.0)
NEUT#: 4.3 10*3/uL (ref 1.5–6.5)
NEUT%: 53 % (ref 38.4–76.8)
Platelets: 332 10*3/uL (ref 145–400)

## 2009-03-27 LAB — IRON AND TIBC
%SAT: 12 % — ABNORMAL LOW (ref 20–55)
Iron: 33 ug/dL — ABNORMAL LOW (ref 42–145)

## 2009-03-27 LAB — FERRITIN: Ferritin: 325 ng/mL — ABNORMAL HIGH (ref 10–291)

## 2009-03-28 ENCOUNTER — Ambulatory Visit: Payer: Self-pay | Admitting: Internal Medicine

## 2009-04-02 ENCOUNTER — Encounter: Payer: Self-pay | Admitting: Internal Medicine

## 2009-04-03 ENCOUNTER — Telehealth (INDEPENDENT_AMBULATORY_CARE_PROVIDER_SITE_OTHER): Payer: Self-pay | Admitting: *Deleted

## 2009-04-03 ENCOUNTER — Ambulatory Visit: Payer: Self-pay | Admitting: Internal Medicine

## 2009-04-07 ENCOUNTER — Ambulatory Visit (HOSPITAL_COMMUNITY): Payer: Self-pay | Admitting: Psychology

## 2009-04-30 ENCOUNTER — Ambulatory Visit (HOSPITAL_COMMUNITY): Payer: Self-pay | Admitting: Psychiatry

## 2009-04-30 ENCOUNTER — Ambulatory Visit: Payer: Self-pay | Admitting: Internal Medicine

## 2009-05-02 ENCOUNTER — Ambulatory Visit (HOSPITAL_COMMUNITY): Payer: Self-pay | Admitting: Psychology

## 2009-05-20 ENCOUNTER — Ambulatory Visit: Payer: Self-pay | Admitting: Internal Medicine

## 2009-06-02 ENCOUNTER — Ambulatory Visit: Payer: Self-pay | Admitting: Internal Medicine

## 2009-06-02 ENCOUNTER — Ambulatory Visit (HOSPITAL_COMMUNITY): Payer: Self-pay | Admitting: Psychology

## 2009-06-10 ENCOUNTER — Ambulatory Visit (HOSPITAL_COMMUNITY): Payer: Self-pay | Admitting: Psychology

## 2009-06-16 ENCOUNTER — Ambulatory Visit: Payer: Self-pay | Admitting: Internal Medicine

## 2009-06-24 ENCOUNTER — Ambulatory Visit: Payer: Self-pay | Admitting: Internal Medicine

## 2009-06-25 ENCOUNTER — Ambulatory Visit (HOSPITAL_COMMUNITY): Payer: Self-pay | Admitting: Psychiatry

## 2009-07-10 ENCOUNTER — Ambulatory Visit (HOSPITAL_COMMUNITY): Payer: Self-pay | Admitting: Psychology

## 2009-07-10 ENCOUNTER — Ambulatory Visit: Payer: Self-pay | Admitting: Internal Medicine

## 2009-07-17 ENCOUNTER — Ambulatory Visit: Payer: Self-pay | Admitting: Internal Medicine

## 2009-07-23 ENCOUNTER — Ambulatory Visit (HOSPITAL_COMMUNITY): Payer: Self-pay | Admitting: Psychology

## 2009-07-24 LAB — HM PAP SMEAR: HM Pap smear: NORMAL

## 2009-08-01 ENCOUNTER — Ambulatory Visit: Payer: Self-pay | Admitting: Internal Medicine

## 2009-08-04 ENCOUNTER — Ambulatory Visit: Payer: Self-pay | Admitting: Internal Medicine

## 2009-08-05 ENCOUNTER — Ambulatory Visit: Payer: Self-pay | Admitting: Internal Medicine

## 2009-08-05 ENCOUNTER — Ambulatory Visit (HOSPITAL_COMMUNITY): Payer: Self-pay | Admitting: Psychology

## 2009-08-12 ENCOUNTER — Ambulatory Visit: Payer: Self-pay | Admitting: Internal Medicine

## 2009-08-13 ENCOUNTER — Ambulatory Visit (HOSPITAL_COMMUNITY): Payer: Self-pay | Admitting: Psychiatry

## 2009-08-19 ENCOUNTER — Ambulatory Visit: Payer: Self-pay | Admitting: Internal Medicine

## 2009-08-25 ENCOUNTER — Ambulatory Visit (HOSPITAL_COMMUNITY): Payer: Self-pay | Admitting: Psychiatry

## 2009-08-29 ENCOUNTER — Ambulatory Visit: Payer: Self-pay | Admitting: Internal Medicine

## 2009-08-29 ENCOUNTER — Ambulatory Visit (HOSPITAL_COMMUNITY): Payer: Self-pay | Admitting: Psychiatry

## 2009-09-05 ENCOUNTER — Ambulatory Visit: Payer: Self-pay | Admitting: Internal Medicine

## 2009-09-08 ENCOUNTER — Ambulatory Visit (HOSPITAL_COMMUNITY): Payer: Self-pay | Admitting: Psychology

## 2009-09-12 ENCOUNTER — Ambulatory Visit: Payer: Self-pay | Admitting: Internal Medicine

## 2009-09-17 ENCOUNTER — Ambulatory Visit: Payer: Self-pay | Admitting: Hematology and Oncology

## 2009-09-19 ENCOUNTER — Ambulatory Visit: Payer: Self-pay | Admitting: Internal Medicine

## 2009-09-22 LAB — CBC WITH DIFFERENTIAL/PLATELET
BASO%: 1 % (ref 0.0–2.0)
Basophils Absolute: 0.1 10*3/uL (ref 0.0–0.1)
HGB: 12.8 g/dL (ref 11.6–15.9)
LYMPH%: 36.3 % (ref 14.0–49.7)
MCV: 68.7 fL — ABNORMAL LOW (ref 79.5–101.0)
Platelets: 384 10*3/uL (ref 145–400)
RBC: 5.71 10*6/uL — ABNORMAL HIGH (ref 3.70–5.45)
RDW: 17.1 % — ABNORMAL HIGH (ref 11.2–14.5)
lymph#: 3 10*3/uL (ref 0.9–3.3)

## 2009-09-22 LAB — BASIC METABOLIC PANEL
CO2: 24 mEq/L (ref 19–32)
Creatinine, Ser: 0.79 mg/dL (ref 0.40–1.20)

## 2009-09-22 LAB — IRON AND TIBC: %SAT: 18 % — ABNORMAL LOW (ref 20–55)

## 2009-09-23 ENCOUNTER — Ambulatory Visit (HOSPITAL_COMMUNITY): Payer: Self-pay | Admitting: Psychiatry

## 2009-10-03 ENCOUNTER — Ambulatory Visit: Payer: Self-pay | Admitting: Internal Medicine

## 2009-10-07 ENCOUNTER — Ambulatory Visit: Payer: Self-pay | Admitting: Internal Medicine

## 2009-10-07 ENCOUNTER — Ambulatory Visit (HOSPITAL_COMMUNITY): Payer: Self-pay | Admitting: Psychiatry

## 2009-10-14 ENCOUNTER — Ambulatory Visit: Payer: Self-pay | Admitting: Internal Medicine

## 2009-10-14 ENCOUNTER — Ambulatory Visit (HOSPITAL_COMMUNITY): Payer: Self-pay | Admitting: Psychology

## 2009-10-21 ENCOUNTER — Ambulatory Visit: Payer: Self-pay | Admitting: Internal Medicine

## 2009-10-21 ENCOUNTER — Ambulatory Visit (HOSPITAL_COMMUNITY): Payer: Self-pay | Admitting: Psychology

## 2009-10-28 ENCOUNTER — Ambulatory Visit: Payer: Self-pay | Admitting: Internal Medicine

## 2009-10-28 ENCOUNTER — Ambulatory Visit (HOSPITAL_COMMUNITY): Payer: Self-pay | Admitting: Psychology

## 2009-11-04 ENCOUNTER — Ambulatory Visit: Payer: Self-pay | Admitting: Internal Medicine

## 2009-11-04 ENCOUNTER — Ambulatory Visit (HOSPITAL_COMMUNITY): Payer: Self-pay | Admitting: Marriage and Family Therapist

## 2009-11-10 ENCOUNTER — Ambulatory Visit (HOSPITAL_COMMUNITY): Payer: Self-pay | Admitting: Marriage and Family Therapist

## 2009-11-10 ENCOUNTER — Ambulatory Visit: Payer: Self-pay | Admitting: Internal Medicine

## 2009-11-17 ENCOUNTER — Ambulatory Visit (HOSPITAL_COMMUNITY): Payer: Self-pay | Admitting: Psychiatry

## 2009-11-21 ENCOUNTER — Ambulatory Visit: Payer: Self-pay | Admitting: Internal Medicine

## 2009-11-25 ENCOUNTER — Ambulatory Visit (HOSPITAL_COMMUNITY): Payer: Self-pay | Admitting: Marriage and Family Therapist

## 2009-12-09 ENCOUNTER — Ambulatory Visit (HOSPITAL_COMMUNITY): Payer: Self-pay | Admitting: Marriage and Family Therapist

## 2009-12-12 ENCOUNTER — Ambulatory Visit: Payer: Self-pay | Admitting: Internal Medicine

## 2009-12-16 ENCOUNTER — Ambulatory Visit: Payer: Self-pay | Admitting: Internal Medicine

## 2009-12-24 ENCOUNTER — Encounter: Payer: Self-pay | Admitting: Internal Medicine

## 2009-12-25 ENCOUNTER — Ambulatory Visit: Payer: Self-pay | Admitting: Internal Medicine

## 2009-12-29 ENCOUNTER — Ambulatory Visit (HOSPITAL_COMMUNITY): Payer: Self-pay | Admitting: Marriage and Family Therapist

## 2010-01-02 ENCOUNTER — Encounter: Payer: Self-pay | Admitting: Internal Medicine

## 2010-01-08 ENCOUNTER — Ambulatory Visit: Payer: Self-pay | Admitting: Internal Medicine

## 2010-01-12 ENCOUNTER — Ambulatory Visit (HOSPITAL_COMMUNITY): Payer: Self-pay | Admitting: Marriage and Family Therapist

## 2010-01-16 ENCOUNTER — Ambulatory Visit: Payer: Self-pay | Admitting: Internal Medicine

## 2010-01-27 ENCOUNTER — Ambulatory Visit (HOSPITAL_COMMUNITY): Payer: Self-pay | Admitting: Marriage and Family Therapist

## 2010-01-30 ENCOUNTER — Ambulatory Visit: Payer: Self-pay | Admitting: Internal Medicine

## 2010-02-06 ENCOUNTER — Ambulatory Visit: Payer: Self-pay | Admitting: Internal Medicine

## 2010-02-10 ENCOUNTER — Ambulatory Visit (HOSPITAL_COMMUNITY): Payer: Self-pay | Admitting: Psychology

## 2010-02-14 ENCOUNTER — Encounter: Payer: Self-pay | Admitting: Internal Medicine

## 2010-02-17 ENCOUNTER — Ambulatory Visit: Payer: Self-pay | Admitting: Internal Medicine

## 2010-03-19 ENCOUNTER — Ambulatory Visit: Payer: Self-pay | Admitting: Hematology and Oncology

## 2010-03-24 LAB — CBC WITH DIFFERENTIAL/PLATELET
BASO%: 0.4 % (ref 0.0–2.0)
EOS%: 4.3 % (ref 0.0–7.0)
LYMPH%: 44.7 % (ref 14.0–49.7)
MCH: 21.4 pg — ABNORMAL LOW (ref 25.1–34.0)
MCHC: 31.6 g/dL (ref 31.5–36.0)
MCV: 67.7 fL — ABNORMAL LOW (ref 79.5–101.0)
MONO#: 0.8 10*3/uL (ref 0.1–0.9)
MONO%: 11.1 % (ref 0.0–14.0)
Platelets: 375 10*3/uL (ref 145–400)
RBC: 5.46 10*6/uL — ABNORMAL HIGH (ref 3.70–5.45)
WBC: 7.6 10*3/uL (ref 3.9–10.3)

## 2010-03-24 LAB — COMPREHENSIVE METABOLIC PANEL
AST: 17 U/L (ref 0–37)
BUN: 12 mg/dL (ref 6–23)
Chloride: 97 mEq/L (ref 96–112)
Glucose, Bld: 100 mg/dL — ABNORMAL HIGH (ref 70–99)
Sodium: 139 mEq/L (ref 135–145)
Total Protein: 7.9 g/dL (ref 6.0–8.3)

## 2010-03-24 LAB — FERRITIN: Ferritin: 413 ng/mL — ABNORMAL HIGH (ref 10–291)

## 2010-03-24 LAB — IRON AND TIBC
%SAT: 16 % — ABNORMAL LOW (ref 20–55)
Iron: 51 ug/dL (ref 42–145)
TIBC: 317 ug/dL (ref 250–470)

## 2010-03-30 ENCOUNTER — Ambulatory Visit (HOSPITAL_COMMUNITY): Payer: Self-pay | Admitting: Psychiatry

## 2010-04-13 ENCOUNTER — Ambulatory Visit: Payer: Self-pay | Admitting: Internal Medicine

## 2010-04-24 ENCOUNTER — Ambulatory Visit (HOSPITAL_COMMUNITY): Payer: Self-pay | Admitting: Psychology

## 2010-05-04 ENCOUNTER — Ambulatory Visit (HOSPITAL_COMMUNITY): Payer: Self-pay | Admitting: Psychology

## 2010-05-19 ENCOUNTER — Ambulatory Visit (HOSPITAL_COMMUNITY): Payer: Self-pay | Admitting: Psychology

## 2010-06-02 ENCOUNTER — Ambulatory Visit (HOSPITAL_COMMUNITY): Payer: Self-pay | Admitting: Psychology

## 2010-06-23 ENCOUNTER — Ambulatory Visit (HOSPITAL_COMMUNITY): Payer: Self-pay | Admitting: Psychology

## 2010-07-01 ENCOUNTER — Ambulatory Visit (HOSPITAL_COMMUNITY): Payer: Self-pay | Admitting: Psychiatry

## 2010-07-14 ENCOUNTER — Ambulatory Visit (HOSPITAL_COMMUNITY): Payer: Self-pay | Admitting: Psychology

## 2010-07-27 ENCOUNTER — Ambulatory Visit (HOSPITAL_COMMUNITY): Admit: 2010-07-27 | Payer: Self-pay | Admitting: Psychology

## 2010-08-05 ENCOUNTER — Ambulatory Visit (HOSPITAL_COMMUNITY)
Admission: RE | Admit: 2010-08-05 | Discharge: 2010-08-05 | Payer: Self-pay | Source: Home / Self Care | Attending: Psychology | Admitting: Psychology

## 2010-08-18 NOTE — Miscellaneous (Signed)
Summary: Injection Record / [facility]    Injection Record / [facility]    Imported By: Rise Patience 03/20/2010 10:50:19  _____________________________________________________________________  External Attachment:    Type:   Image     Comment:   External Document

## 2010-08-18 NOTE — Miscellaneous (Signed)
Summary: Injection Record/[facility]  Injection Record/[facility]   Imported By: Sherian Rein 12/09/2009 13:56:23  _____________________________________________________________________  External Attachment:    Type:   Image     Comment:   External Document

## 2010-08-18 NOTE — Miscellaneous (Signed)
Summary: PAP Device Phone Follow Up/[facility]  PAP Device Phone Follow Up/[facility]   Imported By: Sherian Rein 01/13/2010 11:16:02  _____________________________________________________________________  External Attachment:    Type:   Image     Comment:   External Document

## 2010-08-18 NOTE — Assessment & Plan Note (Signed)
Summary: 4 months/ mbw   Primary Provider/Referring Provider:  Daub/ [facility]  CC:  4 month follow up visit-allergies and OSA.Marland Kitchen  History of Present Illness:  June 16, 2009- Allergc rhinitis, asthma, OSA, CAD Got better after NP visit in September- says she got sick a few days after getting her flu vax. Now on cogentin for shaking. CPAP- not regularly now, can't really say why not except she is up to bathroom so often with her irritable bowel, treated with Align. Back pain from disc disease, but denies wheeze cough or unusual chest discomfort today. Uses her nebulizer very occasionally. Asks sample rescue inhaler.  Dec 16, 2009- Allergic rhinitis, asthma, OSA, CAD Admits not using CPAP- has to get up too much for nocturia at night and it is prohibitive to keep taking it off. Dx'd DM. Wakes drooling. Sleeps on 2 pillows. Wakes heartburn 3 x/ week. Takes Tums. Asthma- dyspnea with longer walks and hills. Uses Advair. Uses Proair about once daily. Wheeze occasionally.No regular exercise. She does continue allergy vaccine here weekly and feels that help. Stayed inside this Spring.  April 13, 2010- Allergic rhinitis, asthma, OSA, CAD Lost insurance and had to drop allergy vaccine. I explained that we would look at where things stand when she has insurance again. Uses Proair once every morning. Occasionally needs it twice. Has a little wheeze now and some postnasal drip, but not a cold.  Continues CPAP all night every night at 11.     Asthma History    Initial Asthma Severity Rating:    Age range: 12+ years    Symptoms: 0-2 days/week    Nighttime Awakenings: 0-2/month    Interferes w/ normal activity: no limitations    SABA use (not for EIB): 0-2 days/week    Asthma Severity Assessment: Intermittent   Preventive Screening-Counseling & Management  Alcohol-Tobacco     Smoking Status: never  Current Medications (verified): 1)  Allegra 60 Mg  Tabs (Fexofenadine Hcl) .Marland Kitchen.. 1 By  Mouth Two Times A Day 2)  Advair Diskus 250-50 Mcg/dose  Misc (Fluticasone-Salmeterol) .Marland Kitchen.. 1 Puff Two Times A Day 3)  Potassium Chloride Crys Cr 20 Meq  Tbcr (Potassium Chloride Crys Cr) .... Take 1 By Mouth Once Daily 4)  Diovan Hct 160-25 Mg  Tabs (Valsartan-Hydrochlorothiazide) .... Take 1 By Mouth Once Daily 5)  Metoprolol Tartrate 100 Mg Tabs (Metoprolol Tartrate) .... Take 1 Tablet By Mouth Two Times A Day 6)  Warfarin Sodium 7.5 Mg Tabs (Warfarin Sodium) .... Take 1.5 By Mouth Once Daily or As Directed 7)  Diltiazem Hcl Cr 240 Mg Xr24h-Cap (Diltiazem Hcl) .... Take 1 By Mouth Once Daily 8)  Zoloft 100 Mg Tabs (Sertraline Hcl) .... Take 1 1/2 By Mouth Once Daily 9)  Tylenol .... As Needed 10)  Proair Hfa 108 (90 Base) Mcg/act  Aers (Albuterol Sulfate) .... 2 Puffs Four Times A Day As Needed 11)  Albuterol Sulfate (2.5 Mg/43ml) 0.083%  Nebu (Albuterol Sulfate) .Marland Kitchen.. 1 Four Times A Day As Needed Neb 12)  Allergy Vaccine 1:10 Gh 13)  Cpap 11 [facility]c 14)  Abilify 15 Mg Tabs (Aripiprazole) .... Take 1 By Mouth Once Daily 15)  Ativan 0.5 Mg Tabs (Lorazepam) .... Take As Directed As Needed 16)  Isosorbide Mononitrate Cr 30 Mg Xr24h-Tab (Isosorbide Mononitrate) .... Take 1 By Mouth Once Daily 17)  Crestor 10 Mg Tabs (Rosuvastatin Calcium) .... Take 1 By Mouth Once Daily 18)  Hydrocodone-Acetaminophen 5-325 Mg Tabs (Hydrocodone-Acetaminophen) .... Evry 6 Hours As Needed For  Pain 19)  Skelaxin 800 Mg Tabs (Metaxalone) .... Take 1 By Mouth Three Times A Day As Needed 20)  Benztropine Mesylate 0.5 Mg Tabs (Benztropine Mesylate) .... Take 1 By Mouth Once Daily 21)  Metformin Hcl 500 Mg Tabs (Metformin Hcl) .... Take 1 By Mouth Once Daily 22)  Nasonex 50 Mcg/act Susp (Mometasone Furoate) .Marland Kitchen.. 1-2 Sprays in Each Nostril Once Daily As Needed 23)  Morphine Sulfate 15 Mg Tabs (Morphine Sulfate) .... Take 1 By Mouth At Bedtime 24)  Aspirin 81 Mg Tbec (Aspirin) .... Take 1 By Mouth Once Daily 25)  Nitrostat 0.4  Mg Subl (Nitroglycerin) .... Use As Directed As Needed  Allergies (verified): 1)  ! 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  No ETOH use. Divorced with 2 children  Risk Factors: Smoking Status: never (04/13/2010)  Review of Systems      See HPI       The patient complains of shortness of breath with activity and nasal congestion/difficulty breathing through nose.  The patient denies shortness of breath at rest, productive cough, non-productive cough, coughing up blood, chest pain, irregular heartbeats, acid heartburn, indigestion, loss of appetite, weight change, abdominal pain, difficulty swallowing, sore throat, tooth/dental problems, headaches, sneezing, and itching.    Vital Signs:  Patient profile:   60 year old female Height:      64 inches Weight:      298.38 pounds BMI:     51.40 O2 Sat:      94 % on Room air Pulse rate:   64 / minute BP sitting:   112 / 78  (left arm) Cuff size:   large  Vitals Entered By: Reynaldo Minium CMA (April 13, 2010 2:09 PM)  O2 Flow:  Room air CC: 4 month follow up visit-allergies and OSA.   Physical Exam  Additional Exam:  General: A/Ox3; pleasant and cooperative, NAD, very obese-  SKIN: no rash, lesions NODES: no lymphadenopathy HEENT: [place]/AT, EOM- WNL, Conjuctivae- clear, PERRLA periorbital edema, TM-WNL, Nose- clear,  Throat- clear and wnl, Mallampati III, mucosa clear NECK: Supple w/ fair ROM, JVD- none, normal carotid impulses w/o bruits Thyroid CHEST: Clear to P&A, no wheeze or cough HEART: RRR, no m/g/r heard ABDOMEN:obese UJW:JXBJ, nl pulses, no edema  NEURO: Grossly intact to observation      Impression & Recommendations:  Problem # 1:  ASTHMA (ICD-493.90) Control has been good. We are concerned about maintaining coverage as Fall progresse since she has lost insurance through her job. We can give a sample inhaler today and send routine scripts for her to hold. She thinks she will have insurance back at first of October.  Problem # 2:  ALLERGIC RHINITIS (ICD-477.9)  We will stay off allergy vaccine for now. Consider medication options, esecially otc antihistamines. Her updated medication list for this problem includes:    Allegra 60 Mg Tabs (Fexofenadine hcl) .Marland Kitchen... 1 by mouth two times a day    Nasonex 50 Mcg/act Susp (Mometasone furoate) .Marland KitchenMarland KitchenMarland KitchenMarland Kitchen  1-2 sprays in each nostril once daily as needed  Problem # 3:  SLEEP APNEA, OBSTRUCTIVE (ICD-327.23)  Good compliance and control. weight loss would help.  Orders: Est. Patient Level III (29562)  Medications Added to Medication List This Visit: 1)  Warfarin Sodium 7.5 Mg Tabs (Warfarin sodium) .... Take 1.5 by mouth once daily or as directed 2)  Diltiazem Hcl Cr 240 Mg Xr24h-cap (Diltiazem hcl) .... Take 1 by mouth once daily 3)  Cpap 11 Ahc  4)  Abilify 15 Mg Tabs (Aripiprazole) .... Take 1 by mouth once daily 5)  Nasonex 50 Mcg/act Susp (Mometasone furoate) .Marland Kitchen.. 1-2 sprays in each nostril once daily as needed 6)  Morphine Sulfate 15 Mg Tabs (Morphine sulfate) .... Take 1 by mouth at bedtime 7)  Aspirin 81 Mg Tbec (Aspirin) .... Take 1 by mouth once daily 8)  Nitrostat 0.4 Mg Subl (Nitroglycerin) .... Use as directed as needed  Other Orders: Future Orders: Admin 1st Vaccine (13086) ... 04/14/2010 Flu Vaccine 65yrs + (57846) ...  04/14/2010  Patient Instructions: 1)  Please schedule a follow-up appointment in 6 months. 2)  Print script refills. You can ask pharmacy to put them on hold in their files, ot you don't have to buy what you don't need  now. 3)  flu vax 4)  Sample rescue inhaler- proair Prescriptions: NASONEX 50 MCG/ACT SUSP (MOMETASONE FUROATE) 1-2 sprays in each nostril once daily as needed  #1 x prn   Entered and Authorized by:   Waymon Budge MD   Signed by:   Waymon Budge MD on 04/13/2010   Method used:   Print then Give to Patient   RxID:   [id number] ALBUTEROL SULFATE (2.5 MG/3ML) 0.083%  NEBU (ALBUTEROL SULFATE) 1 four times a day as needed neb  #25 x prn   Entered and Authorized by:   Waymon Budge MD   Signed by:   Waymon Budge MD on 04/13/2010   Method used:   Print then Give to Patient   RxID:   [id number] PROAIR HFA 108 (90 BASE) MCG/ACT  AERS (ALBUTEROL SULFATE) 2 puffs four times a day as needed  #3 x 3   Entered and Authorized by:   Waymon Budge MD   Signed by:   Waymon Budge MD on 04/13/2010   Method used:   Print then Give to Patient   RxID:   [id number] ADVAIR DISKUS 250-50 MCG/DOSE  MISC (FLUTICASONE-SALMETEROL) 1 puff two times a day  #1 x prn   Entered and Authorized by:   Waymon Budge MD   Signed by:   Waymon Budge MD on 04/13/2010   Method used:   Print then Give to Patient   RxID:   [id number]  Flu Vaccine Consent Questions     Do you have a history of severe allergic reactions to this vaccine? no    Any prior history of allergic reactions to egg and/or gelatin? no    Do you have a sensitivity to the preservative Thimersol? no    Do you have a past history of Guillan-Barre Syndrome? no    Do you currently have an acute febrile illness? no    Have you ever had a severe reaction to latex? no    Vaccine information given and explained to patient? yes    Are you currently pregnant? no    Lot Number:[id number]   Exp  Date:01/16/2011   Site Given  Left Deltoid [id number]       .  lbflu Vivianne Spence

## 2010-08-18 NOTE — Miscellaneous (Signed)
Summary: Injecton Record/[facility]  Injecton Record/[facility]   Imported By: Sherian Rein 11/19/2009 14:38:24  _____________________________________________________________________  External Attachment:    Type:   Image     Comment:   External Document

## 2010-08-18 NOTE — Assessment & Plan Note (Signed)
Summary: 6 months/apc   Primary Provider/Referring Provider:  Daub/ [facility]  CC:  6 month follow up visit-sleep and allergies..  History of Present Illness:  12/17/08- allergic rhinitis, asthma, OSA, CAD  Still uses cpap only occasionally.  Easy dyspnea with exertion- gets breathless without wheeze. Rescue inhaler seems to help. Does have angina every 2 weeks or so. Got Long Term disability approved. Some variable dry cough and wheeze. Took sister's lasix when feet felt swollen. Noncompliant with compression stockings.  April 03, 2009--Presents for an acute office visit. Complains of wheezing, prod cough with yellow/green mucus, increased SOB, body aches, chills/sweats x2days. OTC not working. Denies chest pain,  orthopnea, hemoptysis, fever, n/v/d, edema, headache.   June 16, 2009- Allergc rhinitis, asthma, OSA, CAD Got better after NP visit in September- says she got sick a few days after getting her flu vax. Now on cogentin for shaking. CPAP- not regularly now, can't really say why not except she is up to bathroom so often with her irritable bowel, treated with Align. Back pain from disc disease, but denies wheeze cough or unusual chest discomfort today. Uses her nebulizer very occasionally. Asks sample rescue inhaler.  Dec 16, 2009- Allergic rhinitis, asthma, OSA, CAD Admits not using CPAP- has to get up too much for nocturia at night and it is prohibitive to keep taking it off. Dx'd DM. Wakes drooling. Sleeps on 2 pillows. Wakes heartburn 3 x/ week. Takes Tums. Asthma- dyspnea with longer walks and hills. Uses Advair. Uses Proair about once daily. Wheeze occasionally.No regular exercise. She does continue allergy vaccine here weekly and feels that help. Stayed inside this Spring.    Asthma History    Asthma Control Assessment:    Age range: 12+ years    Symptoms: 0-2 days/week    Nighttime Awakenings: 0-2/month    Interferes w/ normal activity: no limitations    SABA use  (not for EIB): >2 days/week    Asthma Control Assessment: Not Well Controlled   Preventive Screening-Counseling & Management  Alcohol-Tobacco     Smoking Status: never  Current Medications (verified): 1)  Allegra 60 Mg  Tabs (Fexofenadine Hcl) .Marland Kitchen.. 1 By Mouth Two Times A Day 2)  Advair Diskus 250-50 Mcg/dose  Misc (Fluticasone-Salmeterol) .Marland Kitchen.. 1 Puff Two Times A Day 3)  Potassium Chloride Crys Cr 20 Meq  Tbcr (Potassium Chloride Crys Cr) .... Take 1 By Mouth Once Daily 4)  Diovan Hct 160-25 Mg  Tabs (Valsartan-Hydrochlorothiazide) .... Take 1 By Mouth Once Daily 5)  Metoprolol Tartrate 100 Mg Tabs (Metoprolol Tartrate) .... Take 1 Tablet By Mouth Two Times A Day 6)  Coumadin .... Take As Directed 7)  Diltiazem Hcl Er Beads 180 Mg  Cp24 (Diltiazem Hcl Er Beads) .Marland Kitchen.. 1 By Mouth Once Daily 8)  Zoloft 100 Mg Tabs (Sertraline Hcl) .... Take 1 1/2 By Mouth Once Daily 9)  Tylenol .... As Needed 10)  Proair Hfa 108 (90 Base) Mcg/act  Aers (Albuterol Sulfate) .... 2 Puffs Four Times A Day As Needed 11)  Albuterol Sulfate (2.5 Mg/59ml) 0.083%  Nebu (Albuterol Sulfate) .Marland Kitchen.. 1 Four Times A Day As Needed Neb 12)  Allergy Vaccine 1:10 Gh 13)  Cpap 11 [employer] 14)  Abilify 10 Mg Tabs (Aripiprazole) .... Take 1 Tablet By Mouth Once A Day 15)  Ativan 0.5 Mg Tabs (Lorazepam) .... Take As Directed As Needed 16)  Isosorbide Mononitrate Cr 30 Mg Xr24h-Tab (Isosorbide Mononitrate) .... Take 1 By Mouth Once Daily 17)  Crestor 10 Mg Tabs (  Rosuvastatin Calcium) .... Take 1 By Mouth Once Daily 18)  Hydrocodone-Acetaminophen 5-325 Mg Tabs (Hydrocodone-Acetaminophen) .... Evry 6 Hours As Needed For Pain 19)  Skelaxin 800 Mg Tabs (Metaxalone) .... Take 1 By Mouth Three Times A Day As Needed 20)  Benztropine Mesylate 0.5 Mg Tabs (Benztropine Mesylate) .... Take 1 By Mouth Once Daily 21)  Metformin Hcl 500 Mg Tabs (Metformin Hcl) .... Take 1 By Mouth Once Daily  Allergies (verified): 1)  ! 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  The patient denies shortness of breath at rest, productive cough, non-productive cough, coughing up blood, chest pain, irregular heartbeats, indigestion, loss of appetite, weight change, abdominal pain, difficulty swallowing, sore throat, tooth/dental problems, headaches, nasal congestion/difficulty breathing through nose, sneezing, itching, ear ache, anxiety, depression, hand/feet swelling, joint stiffness or pain, rash, change in color of mucus, and fever.    Vital Signs:  Patient profile:   60 year old female Height:      64 inches Weight:      293 pounds BMI:     50.48 O2 Sat:      92 % on Room air Pulse rate:   77 / minute BP sitting:   112 / 78  (left arm) Cuff size:   regular  Vitals Entered By: Reynaldo Minium CMA (Dec 16, 2009 1:38 PM)  O2 Flow:  Room air  Physical Exam  Additional Exam:  General: A/Ox3; pleasant and cooperative, NAD, very obese-  diffident affect SKIN: no rash, lesions NODES: no lymphadenopathy HEENT: [place]/AT, EOM- WNL, Conjuctivae- clear, PERRLA periorbital edema, TM-WNL, Nose- clear, Throat- clear and wnl, Mallampati  III NECK: Supple w/ fair ROM, JVD- none, normal carotid impulses w/o bruits Thyroid CHEST: Clear to P&A, no wheeze or cough HEART: RRR, no m/g/r heard ABDOMEN:obese ZOX:WRUE, nl pulses, no edema  NEURO: Grossly intact to observation      Impression & Recommendations:  Problem # 1:  SLEEP APNEA, OBSTRUCTIVE (ICD-327.23)  Poor compliance, but understandable. No good CPAP soloution until she controls nocturia. I asked about urology evaluation.. We discussed options. We will check room air sleep oximetry.  Problem # 2:  OBESITY, MORBID (ICD-278.01) Importance of weight loss wasw reviewed with her as it impacts sleep apnea and Dm.  Problem # 3:  ALLERGIC RHINITIS (ICD-477.9)  discussed otc antihistamines Gave print info on dust control measures. Her updated medication list for this problem includes:    Allegra 60 Mg Tabs (Fexofenadine hcl) .Marland Kitchen... 1 by mouth two times a day  Orders: Est. Patient Level IV (45409)  Problem # 4:  ASTHMA (ICD-493.90) Only fair control, for last 2 months- Spring pollen season?- has been using rescue inhaler daily.   Medications Added to Medication List  This Visit: 1)  Crestor 10 Mg Tabs (Rosuvastatin calcium) .... Take 1 by mouth once daily 2)  Skelaxin 800 Mg Tabs (Metaxalone) .... Take 1 by mouth three times a day as needed 3)  Benztropine Mesylate 0.5 Mg Tabs (Benztropine mesylate) .... Take 1 by mouth once daily 4)  Metformin Hcl 500 Mg Tabs (Metformin hcl) .... Take 1 by mouth once daily  Other Orders: DME Referral (DME) DME Referral (DME)  Patient Instructions: 1)  Please schedule a follow-up appointment in 4 months. 2)  See [facility] to set up overnight oxygen check on room air, not wearing CPAP 3)  Try an otc antihistamine like loratadine/ claritin, or Allegra, for your allergy. Continue allergy shots. 4)  Allergy/ dust control information as discussed

## 2010-08-19 ENCOUNTER — Ambulatory Visit (HOSPITAL_COMMUNITY): Admit: 2010-08-19 | Payer: Self-pay | Admitting: Psychology

## 2010-08-19 ENCOUNTER — Encounter (INDEPENDENT_AMBULATORY_CARE_PROVIDER_SITE_OTHER): Payer: No Typology Code available for payment source | Admitting: Psychology

## 2010-08-19 DIAGNOSIS — F4310 Post-traumatic stress disorder, unspecified: Secondary | ICD-10-CM

## 2010-08-19 DIAGNOSIS — F331 Major depressive disorder, recurrent, moderate: Secondary | ICD-10-CM

## 2010-09-02 ENCOUNTER — Encounter (HOSPITAL_COMMUNITY): Payer: PRIVATE HEALTH INSURANCE | Admitting: Psychology

## 2010-09-08 HISTORY — PX: OTHER SURGICAL HISTORY: SHX169

## 2010-09-09 ENCOUNTER — Encounter (HOSPITAL_COMMUNITY): Payer: PRIVATE HEALTH INSURANCE | Admitting: Psychology

## 2010-09-09 DIAGNOSIS — F331 Major depressive disorder, recurrent, moderate: Secondary | ICD-10-CM

## 2010-09-09 DIAGNOSIS — F4310 Post-traumatic stress disorder, unspecified: Secondary | ICD-10-CM

## 2010-09-17 ENCOUNTER — Encounter (HOSPITAL_COMMUNITY): Payer: PRIVATE HEALTH INSURANCE | Admitting: Psychology

## 2010-09-17 DIAGNOSIS — F331 Major depressive disorder, recurrent, moderate: Secondary | ICD-10-CM

## 2010-09-28 ENCOUNTER — Other Ambulatory Visit: Payer: Self-pay | Admitting: Hematology and Oncology

## 2010-09-28 ENCOUNTER — Encounter (HOSPITAL_BASED_OUTPATIENT_CLINIC_OR_DEPARTMENT_OTHER): Payer: Medicare Other | Admitting: Hematology and Oncology

## 2010-09-28 DIAGNOSIS — D509 Iron deficiency anemia, unspecified: Secondary | ICD-10-CM

## 2010-09-28 DIAGNOSIS — D638 Anemia in other chronic diseases classified elsewhere: Secondary | ICD-10-CM

## 2010-09-28 LAB — CBC WITH DIFFERENTIAL/PLATELET
Eosinophils Absolute: 0.1 10*3/uL (ref 0.0–0.5)
HCT: 34.3 % — ABNORMAL LOW (ref 34.8–46.6)
MCHC: 32.1 g/dL (ref 31.5–36.0)
MCV: 67.7 fL — ABNORMAL LOW (ref 79.5–101.0)
MONO#: 0.5 10*3/uL (ref 0.1–0.9)
MONO%: 7 % (ref 0.0–14.0)
NEUT#: 3.1 10*3/uL (ref 1.5–6.5)
NEUT%: 47.1 % (ref 38.4–76.8)
Platelets: 339 10*3/uL (ref 145–400)
RBC: 5.07 10*6/uL (ref 3.70–5.45)
WBC: 6.6 10*3/uL (ref 3.9–10.3)
lymph#: 2.9 10*3/uL (ref 0.9–3.3)

## 2010-09-28 LAB — IRON AND TIBC: Iron: 59 ug/dL (ref 42–145)

## 2010-09-28 LAB — BASIC METABOLIC PANEL
CO2: 30 mEq/L (ref 19–32)
Calcium: 9.7 mg/dL (ref 8.4–10.5)
Glucose, Bld: 99 mg/dL (ref 70–99)

## 2010-09-28 LAB — FERRITIN: Ferritin: 382 ng/mL — ABNORMAL HIGH (ref 10–291)

## 2010-09-30 ENCOUNTER — Encounter (HOSPITAL_BASED_OUTPATIENT_CLINIC_OR_DEPARTMENT_OTHER): Payer: Medicare Other | Admitting: Hematology and Oncology

## 2010-09-30 ENCOUNTER — Encounter (HOSPITAL_COMMUNITY): Payer: Medicare Other | Admitting: Psychiatry

## 2010-09-30 DIAGNOSIS — D638 Anemia in other chronic diseases classified elsewhere: Secondary | ICD-10-CM

## 2010-09-30 DIAGNOSIS — D509 Iron deficiency anemia, unspecified: Secondary | ICD-10-CM

## 2010-09-30 DIAGNOSIS — F331 Major depressive disorder, recurrent, moderate: Secondary | ICD-10-CM

## 2010-10-01 ENCOUNTER — Encounter (HOSPITAL_COMMUNITY): Payer: Medicare Other | Admitting: Psychology

## 2010-10-01 DIAGNOSIS — F331 Major depressive disorder, recurrent, moderate: Secondary | ICD-10-CM

## 2010-10-01 DIAGNOSIS — F4310 Post-traumatic stress disorder, unspecified: Secondary | ICD-10-CM

## 2010-10-02 ENCOUNTER — Encounter: Payer: Self-pay | Admitting: Internal Medicine

## 2010-10-12 ENCOUNTER — Ambulatory Visit (INDEPENDENT_AMBULATORY_CARE_PROVIDER_SITE_OTHER): Payer: Medicare Other | Admitting: Internal Medicine

## 2010-10-12 ENCOUNTER — Encounter: Payer: Self-pay | Admitting: Internal Medicine

## 2010-10-12 VITALS — BP 118/64 | HR 79 | Ht 64.0 in | Wt 294.4 lb

## 2010-10-12 DIAGNOSIS — E119 Type 2 diabetes mellitus without complications: Secondary | ICD-10-CM | POA: Insufficient documentation

## 2010-10-12 DIAGNOSIS — G4733 Obstructive sleep apnea (adult) (pediatric): Secondary | ICD-10-CM

## 2010-10-12 DIAGNOSIS — J45909 Unspecified asthma, uncomplicated: Secondary | ICD-10-CM

## 2010-10-12 DIAGNOSIS — J309 Allergic rhinitis, unspecified: Secondary | ICD-10-CM

## 2010-10-12 NOTE — Assessment & Plan Note (Addendum)
I think she will work with her DME company now that she realizes she can disconnect the mask at the tubing for quick trips to the bathroom. Weight loss would also help but may be out of reach.  CPAP 11

## 2010-10-12 NOTE — Assessment & Plan Note (Signed)
By avoiding the pollen she si avoiding nasal complaints. Does have nasonex.

## 2010-10-12 NOTE — Progress Notes (Signed)
  Subjective:    Patient ID: Michele [name], female    DOB: [birth year], 60 y.o.   MRN: [id number]  HPI 40 yoF never smoker. Followed here for obstructive sleep apnea, allergic rhinitis, asthma, complicated by obesity, GERD and CAD.  Last here April 13, 2010. She had lost her insurance due to finances. Had trouble getting her meds. We had stopped her allergy shots. Because of overactive bladder she had stopped her CPAP. We discussed use of a quick disconnect at tubing so she could leave the mask on.  Breathing now is clear.   Review of Systems Constitutional:   No weight loss, night sweats,  Fevers, chills, fatigue, lassitude. HEENT:   No headaches,  Difficulty swallowing,  Tooth/dental problems,  Sore throat,                CV:  No chest pain,  Orthopnea, PND, swelling in lower extremities, anasarca, dizziness, palpitations  GI  No heartburn, indigestion, abdominal pain, nausea, vomiting, diarrhea, change in bowel habits, loss of appetite  Resp: No shortness of breath with exertion or at rest.  No excess mucus, no productive cough,  No non-productive cough,  No coughing up of blood.  No change in color of mucus.  No wheezing.  No chest wall deformity  Skin: no rash or lesions.  GU: no dysuria, change in color of urine, no urgency or frequency.  No flank pain.  MS:  No joint pain or swelling.  No decreased range of motion.  No back pain.  Psych:  No change in mood or affect. No depression or anxiety.  No memory loss.     Objective:   Physical Exam General- Alert, Oriented, Affect-quiet, Distress- none acute. Very obese  Skin- rash-none, lesions- none, excoriation- none  Lymphadenopathy- none  Head- atraumatic  Eyes- Gross vision intact, PERRLA, conjunctivae clear, secretions  Ears- Normal-  Hearing, canals, Tm L ,   R ,  Nose- Clear,  No-Septal dev, mucus, polyps, erosion, perforation   Throat- Mallampati II , mucosa clear , drainage- none, tonsils-  atrophic  Neck- flexible , trachea midline, no stridor , thyroid nl, carotid no bruit  Chest - symmetrical excursion , unlabored     Heart/CV- RRR , no murmur , no gallop  , no rub, nl s1 s2                     - JVD- none , edema- none, stasis changes- none, varices- none     Lung- clear to P&A, wheeze- none, cough- none , dullness-none, rub- none     Chest wall-  Abd- tender-no, distended-no, bowel sounds-present, HSM- no  Br/ Gen/ Rectal- Not done, not indicated  Extrem- cyanosis- none, clubbing, none, atrophy- none, strength- nl  Neuro- grossly intact to observation         Assessment & Plan:

## 2010-10-12 NOTE — Assessment & Plan Note (Signed)
Finances limit acces to meds. Fortunately winter was mild. She asks for samples.

## 2010-10-12 NOTE — Patient Instructions (Signed)
Please talk with your CPAP company and see if they can give you a hose you can disconnect quickly, under you chin, so it is easier to get to the bathroom without taking the mask off.   Sample rescue albuterol inhaler if available.

## 2010-10-15 ENCOUNTER — Encounter (HOSPITAL_BASED_OUTPATIENT_CLINIC_OR_DEPARTMENT_OTHER): Payer: Medicare Other | Admitting: Psychology

## 2010-10-15 DIAGNOSIS — F331 Major depressive disorder, recurrent, moderate: Secondary | ICD-10-CM

## 2010-10-25 LAB — URINALYSIS, ROUTINE W REFLEX MICROSCOPIC
Bilirubin Urine: NEGATIVE
Ketones, ur: 15 mg/dL — AB
Nitrite: NEGATIVE
Protein, ur: 30 mg/dL — AB
Specific Gravity, Urine: 1.026 (ref 1.005–1.030)
Urobilinogen, UA: 0.2 mg/dL (ref 0.0–1.0)

## 2010-10-25 LAB — POCT CARDIAC MARKERS
CKMB, poc: 1.5 ng/mL (ref 1.0–8.0)
CKMB, poc: 1.9 ng/mL (ref 1.0–8.0)
Myoglobin, poc: 101 ng/mL (ref 12–200)
Troponin i, poc: <0.05 ng/mL (ref 0.00–0.09)

## 2010-10-25 LAB — DIFFERENTIAL
Basophils Absolute: 0.1 10*3/uL (ref 0.0–0.1)
Lymphs Abs: 0.8 10*3/uL (ref 0.7–4.0)
Monocytes Absolute: 0.3 10*3/uL (ref 0.1–1.0)
Monocytes Relative: 4 % (ref 3–12)

## 2010-10-25 LAB — BASIC METABOLIC PANEL
BUN: 9 mg/dL (ref 6–23)
GFR calc non Af Amer: >60 mL/min (ref >60)
Glucose, Bld: 144 mg/dL — ABNORMAL HIGH (ref 70–99)
Potassium: 3.8 mEq/L (ref 3.5–5.1)

## 2010-10-25 LAB — CBC
HCT: 34.7 % — ABNORMAL LOW (ref 36.0–46.0)
Platelets: 356 10*3/uL (ref 150–400)
RDW: 17.3 % — ABNORMAL HIGH (ref 11.5–15.5)

## 2010-10-25 LAB — URINE CULTURE

## 2010-10-29 ENCOUNTER — Encounter (HOSPITAL_BASED_OUTPATIENT_CLINIC_OR_DEPARTMENT_OTHER): Payer: Medicare Other | Admitting: Psychology

## 2010-10-29 DIAGNOSIS — F4310 Post-traumatic stress disorder, unspecified: Secondary | ICD-10-CM

## 2010-10-29 DIAGNOSIS — F332 Major depressive disorder, recurrent severe without psychotic features: Secondary | ICD-10-CM

## 2010-11-12 ENCOUNTER — Encounter (HOSPITAL_BASED_OUTPATIENT_CLINIC_OR_DEPARTMENT_OTHER): Payer: Medicare Other | Admitting: Psychology

## 2010-11-12 DIAGNOSIS — F4310 Post-traumatic stress disorder, unspecified: Secondary | ICD-10-CM

## 2010-11-12 DIAGNOSIS — F331 Major depressive disorder, recurrent, moderate: Secondary | ICD-10-CM

## 2010-11-26 ENCOUNTER — Encounter (HOSPITAL_COMMUNITY): Payer: Medicare Other | Admitting: Psychology

## 2010-12-01 NOTE — Discharge Summary (Signed)
NAMEHELI, DINO           ACCOUNT NO.:  [id number]   MEDICAL RECORD NO.:  [id number]          PATIENT TYPE:  INP   LOCATION:  6529                         FACILITY:  [facility]   PHYSICIAN:  Octavia Heir, MD  DATE OF BIRTH:  1951/07/16   DATE OF ADMISSION:  01/29/2007  DATE OF DISCHARGE:  02/02/2007                               DISCHARGE SUMMARY   Ms. Hammer is a 60 year old African American female patient who has  known nonobstructive coronary disease, recent history of PAF, and placed  on Coumadin with recent hospitalization.  She apparently had symptoms of  chest pain and palpitations, nausea, vomiting, and shortness of breath.  She called EMS and she was found to be atrial fib with rapid ventricular  response.  On the way to the hospital, they gave her diltiazem.  By the  time she got here, she was in sinus rhythm; however, she was still  having chest pain.  It was decided to admit her and that she would need  to undergo cardiac catheterization.  She was put on IV heparin.  Her INR  was not therapeutic; it was 1.1.  She underwent cardiac catheterization  by Dr. Claiborne Billings on January 30, 2007.  She had very tortuous vessels.  Her RCA  was high-grade, 90% and 99%.  She had some clot.  He performed a complex  procedure that took a prolonged amount of time.  Please see his cath  note for complete details.  He was able to stent one of the lesions;  however, the second lesion was not able to adequately get to, and the  procedure was aborted.  She then stayed in the hospital for a few days  to watch her.  She did not have any further chest pain.  Her medications  were titrated.  She was seen by cardiac rehab.  She had some problems  with keeping her potassium up to within normal range.  She did have some  problems with hypertension after her medicines were adjusted, so they  were adjusted somewhat downward.  On February 02, 2007, she was seen by Dr.  Claiborne Billings and considered stable for  discharge home.   VITAL SIGNS:  Her blood pressure was 104/54.  Her heart rate was 66,  respirations 18, temperature 98.6.   LABORATORY DATA:  Hemoglobin was 9.2.  Hematocrit was 28.5, WBC 9.6, and  platelets were 331.  D-dimer was 0.59.  ESR was 49.  Sodium was 133.  Potassium 3.3 on July 16, and on July 17 her potassium was 4.3; sodium  was 136.  BUN was 9.  Creatinine was 0.8.  CK were elevated but not MB.  Hemoglobin A1c was 6.8.  AST was 29.  ALT was 26.  CK-MB #1 at 745/2.0;  troponin was 0.16.  #2 at 734/2.2; troponin was 0.20.  #3 was 544/1.4;  troponin was 0.17.  #4 was 553/1.4.  TSH was 1.315.  Total cholesterol  was 124.  Triglycerides were 60.  HDL was 54 and LDL was 58.  Iron was  43; total TIBC was 276, percent saturation was 16.  Ferritin was  108.  Chest x-ray showed no acute disease.   DISCHARGE MEDICATIONS:  1. Zoloft 50 mg a day.  2. Diltiazem TD 240 every day.  3. K-Dur 10 mg every day.  4. Protonix 40 mg every day.  5. Aspirin 81 mg every day.  6. Plavix 85 mg.  She is not to stop it.  7. Imdur 60 mg a day.  8. Metoprolol 75 twice a day.  9. Diovan HCT 150/12.5 every day.  10.She is to hold her Vytorin because of her elevated CK for now.  11.Nitroglycerin one under tongue and __________ x3 when needed for      chest pain.  12.She is to stop taking her Coumadin.   DISCHARGE DIAGNOSES:  1. Unstable angina.  2. Progressed coronary artery disease with high-grade lesions in her      right coronary artery with subsequent stenting of one of the      lesions.  The other one was an aborted attempt; unable to get to      the vessel because of her tortuosity.  3. Hypertension.  4. Hypokalemia.  5. Paroxysmal atrial fibrillation.  Off Coumadin now because of her      Plavix.  Dr. Claiborne Billings felt that she could be started back on her      Coumadin in 1 to 2 months.  She did have a Cypher stent.  6. Hyperlipidemia.  7. History of asthma.  8. Gastroesophageal reflux  disease.  9. Obstructive sleep apnea.  10.Morbid obesity.      Cyndia Bent, N.P.      Octavia Heir, MD  Electronically Signed    BB/MEDQ  D:  02/02/2007  T:  02/03/2007  Job:  [id number]   cc:   Richardson Landry A. [name], M.D.

## 2010-12-01 NOTE — Op Note (Signed)
[name], Michele [name]           ACCOUNT NO.:  [id number]   MEDICAL RECORD NO.:  [id number]          PATIENT TYPE:  AMB   LOCATION:  ENDO                         FACILITY[facility]  [facility]   PHYSICIAN:  [name], M.D.  DATE OF BIRTH:  [birth year]   DATE OF PROCEDURE:  10/11/2007  DATE OF DISCHARGE:                               OPERATIVE REPORT   PROCEDURE PERFORMED:  Screening colonoscopy.   ENDOSCOPIST:  [name], M.D.   INSTRUMENT USED:  Pentax video colonoscope.   INDICATIONS FOR PROCEDURE:  A 60 year old [place] American female  undergoing a screening colonoscopy.  The patient has a history of iron  deficiency anemia, rule out colonic polyps, masses, etc.   PREPROCEDURE PREPARATION:  Informed consent was procured from the  patient.  The patient fasted for 8 hours prior to the procedure and  prepped with a bottle of magnesium citrate and a gallon of NuLYTELY the  night prior to the procedure.  The risks and benefits of the procedure  including a 10% missed rate of cancer and polyps was explained to the  patient as well.   PREPROCEDURE PHYSICAL:  The patient had stable vital signs.  Neck  supple.  Chest clear to auscultation.  Heart normal S1, S2.  Abdomen  soft with normal bowel sounds.   DESCRIPTION OF PROCEDURE:  The patient was placed in left lateral  decubitus position and sedated with an additional 50 mcg of Fentanyl and  5 mg of Versed given intravenously in slow incremental doses.  Once the  patient was adequately sedated, maintained on low flow oxygen and  continuous cardiac monitoring the Pentax video colonoscope was advanced  from the rectum to the cecum.  The appendiceal orifice and the cecal  valve were clearly visualized and photographed.  No masses, polyps,  erosions, ulcerations or diverticula were noted.  The patient has a  somewhat redundant colon.  The patient's position was changed from the  left lateral to the supine and the right lateral position  then back to  the supine and left lateral position to reach the cecal base.  Retroflexion in the rectum revealed small internal hemorrhoids.  The  patient tolerated the procedure well without complications.   IMPRESSION:  1. Normal colonoscopy up to the cecum.  No masses, polyps, erosions,      ulcerations or diverticula noted.  2. Small internal hemorrhoids seen on retroflexion.   RECOMMENDATIONS:  1. The patient has been advised to restart her Coumadin tomorrow.  2. Outpatient followup in the next 2 weeks for further      recommendations.  3. Avoid all other nonsteroidals for now.      [name], M.D.  Electronically Signed     JNM/MEDQ  D:  10/11/2007  T:  10/11/2007  Job:  [id number]   cc:   Octavia Heir, MD  Fax: [place]. [name], M.D.  Fax: [phone]

## 2010-12-01 NOTE — Discharge Summary (Signed)
NAMELUNETTE, TAPP NO.:  [id number]   MEDICAL RECORD NO.:  [id number]          PATIENT TYPE:  INP   LOCATION:  9983                         FACILITY:  [facility]   PHYSICIAN:  Verner Chol, MD DATE OF BIRTH:  1950-12-03   DATE OF ADMISSION:  11/27/2006  DATE OF DISCHARGE:  12/02/2006                               DISCHARGE SUMMARY   ATTENDING PHYSICIAN:  Verner Chol, M.D.   CONSULTATIONS:  Consulting physicians:  1. Neurology, Dr. Doy Mince.  2. Cardiology, Dr. Wynonia Lawman.   PROCEDURES:  Procedures include:  1. An MRA and MRI of the head showing tortuous intracranial vessels      suggesting hypertension.  There may be some mild intracranial      atherosclerotic disease of the small terminal MCA and PCA branches.      No aneurysm or large vessel occlusion, moderate amount of disease      is seen throughout the white matter extending into the basal      ganglia and thalami bilaterally.  This is most likely due to      chronic ischemia and no acute infarct or mass is identified.  2. New procedures included an abdominal KUB showing nonspecific non-      obstructive bowel gas pattern.  3. Chest x-ray showed no acute cardiopulmonary disease but      degenerative changes seen in the spine.  Heart at the upper limits      of normal in size.   DISCHARGE DIAGNOSES:  Include:  1. Nausea, vomiting, diarrhea, likely due to gastroenteritis,      resolved.  2. Acute onset diabetes mellitus type 2.  3. Paroxysmal atrial fibrillation, new.  4. Sinusitis.  5. Hypokalemia.  6. Hypertension.  7. Hyperlipidemia.  8. Asthma.  9. Gastroesophageal reflux disease.  10.Obstructive sleep apnea on CPAP.  11.Obesity, morbid.  12.Allergic rhinitis.  13.Coronary artery disease.  The patient had a catheterization in July      2002 showing an ejection fraction of 60% with no significant      disease, scattered lesions, all of 50%.   DISCHARGE MEDICATIONS:  1. Advair 250/50  1 puff inhaled two times a day.  2. Avelox 400 mg daily to end December 20, 2006.  3. Potassium - K-Dur 20 mEq daily.  4. Diovan/HCT 160/25 mg 1 tablet daily for blood pressure.  5. Hypertension 25 mg daily.  6. Nasonex 1 spray in each nostril daily.  7. Metoprolol 25 mg two times a day.  8. Coumadin 5 mg tablets, take 1-1/2 tablet by mouth every evening for      3 days and then have your INR checked on Monday, 3 days from now,      to determine what dose she should take next.  9. Ferrous sulfate 325 mg at home dose two times a day.  10.Diltiazem ER 180 mg daily for your irregular heartbeat.  11.Protonix 40 mg 2 times a day.  12.Zoloft 50 mg 1 tablet p.o. daily.  13.Skelaxin 800 mg p.o. two times a day.  14.Tylenol 650 mg 1 tablet every 4 hours as needed for pain.  15.Meclizine 25 mg three times a day.  16.Vytorin 10/40 mg daily.  17.Allegra 60 mg two times a day.  18.The patient is to stop taking aspirin.  She was given prescriptions for Avelox, potassium, Diovan/HCTZ,  metoprolol, Coumadin, diltiazem, Protonix and I believe Nasonex as well.   FOLLOWUP INSTRUCTIONS:  1. She will need to have a BMET, INR and PT checked when she goes to      the [facility] walk-in center on Monday.  She does have a      prescription for this.  2. [facility].  She has an appointment with Dr. Linna Darner at      12:45 on Dec 08, 2006.  3. Diabetes education.  The Case Manager is to set  her up with this      prior to discharge so that she can go to these classes.  4. The patient is to be on a low sodium, heart-healthy, diabetic diet.  5. No restrictions in activity.  She may return to work on May 19th or      20th depending on how she feels.   LABORATORY DATA:  Her laboratory data includes a BMET on discharge with  a sodium of 137, a potassium of 3.6, chloride 99, bicarb 31, glucose  109, BUN 10, creatinine 1.06, calcium of 9.9.  INR on Dec 02, 2006 was  1.8. CBC on Dec 02, 2006 white blood  cell count 10.6, hemoglobin 11.8,  hematocrit 37.3, platelets 437,000.  Magnesium level was within normal  limits at 1.9.  Hemoglobin A1c 7.0.  Blood culture no growth to date.  Cardiac enzymes were negative except for creatinine kinase being  slightly elevated at 203 and then down to normal at 165. CMP was  completely within normal limits.  TSH was normal at 1.199.  Helicobacter  pylori was negative. UA just showed a high specific gravity 1.024 and a  protein of 30.   DISPOSITION:  The patient will be discharged home with followup with  [facility], diabetes education and, as well, she will need to  get her labs checked on Monday.   FOLLOWUP ISSUES:  Followup issues include the patient will need her  creatinine and potassium followed.  She may need to decrease the dose of  her potassium if her potassium is high, since it was increased in the  hospital.  Also will need an INR followed to check her Coumadin levels.   CONDITION ON DISCHARGE:  The patient's condition is improved.   HOSPITAL COURSE:  The patient is an 60 year old African American female  with a history of hypertension, coronary artery disease,  gastroesophageal reflux disease, asthma, obesity and a history of  obstructive sleep apnea who presented here for nausea, vomiting and  dizziness and that on admission was found to have new onset diabetes as  well as paroxysmal atrial fibrillation.   PROBLEM #1.  NAUSEA AND VOMITING:  The etiology of the patient's nausea  and vomiting was most likely due to gastroenteritis.  She did have a  friend who also had a similar sickness going on.  Due to her not  improving and having persistent vomiting, we did consult neurology to  determine if there was a central etiology and no central etiology was  elicited by MRA or MRI.  The patient eventually did improve and was able  to take p.o. well at time of discharge.  She did have a low grade temperature and slowly increasing white  blood  cells during her  hospitalization.  Her blood cultures were negative as well as LFT's,  lipase an H pylori and her KUB was negative on admission as well.  The  reason we were concerned for a central etiology is because she had a  past thalamic infarct on previous imaging studies in the beginning of  May as well as a history of coronary artery disease.  The patient did  develop some diarrhea the day before discharge.  However, upon  discharge, this had improved somewhat.  A Clostridium difficile culture  was done; however, is still pending at this time and her diarrhea is  improving, so this is highly unlikely.   PROBLEM #2.  HEADACHE AND LIGHTHEADEDNESS:  The patient's headache and  lightheadedness were likely due to her dehydration from her nausea and  vomiting and this did improve during her hospital course.  She was  treated with Tylenol, Percocet as well as given Skelaxin to help with  her headache.  She was also given meclizine p.r.n. and fluids to help  with her dizziness.  Neurology felt that her headache could be treated  with a psych medications.  We did restart her Zoloft that she was taking  at home.  The patient was not orthostatic in the hospital here.  She was  walking around well with physical therapy at time of discharge and her  lightheadedness and headache have resolved now.   PROBLEM #3.  PAROXYSMAL ATRIAL FIBRILLATION:  The patient was found to  be in paroxysmal atrial fibrillation near the beginning of her  admission.  Cardiology was consulted.  She was put on a diltiazem drip  and then transitioned to diltiazem p.o. as well as metoprolol.  Her TSH  was within normal limits as well as her echocardiogram.  Her cardiac  enzymes were negative x3, except for elevated CK's.  The patient was  started on Coumadin and initially bridged with Lovenox.  Her INR at the  time of discharge is 1.8.  She will get this checked on Monday, in 3  days at [facility].    PROBLEM #4.  NEW-ONSET DIABETES.  The patient's sugars at the time of  admission were elevated.  Her hemoglobin A1c was 7.0.  We did not start  her on anything but sliding scale insulin here in the hospital, given  her creatinine was rising.  However, this was likely due to her  dehydration.  Her creatinine on discharge is 1.06. If it stops  increasing,  may consider putting her on metformin which we did not  start here because she was having nausea and vomiting.   PROBLEM #5.  SINUSITIS:  Due to the patient's headaches, she was treated  for sinusitis with a course of Avelox and because of maxillary sinus  tenderness she was also given Nasonex.  She will be on the Avelox for a  course of 21 days since we think that she might have chronic sinusitis.   PROBLEM #6.  HYPERTENSION:  The patient was started on Diovan here in  the hospital. She was also treated with metoprolol 25 b.i.d. and  diltiazem, now at a p.o. dose.  PROBLEM #7.  FLUID, ELECTROLYTES AND NUTRITION:  The patient seemingly  has had a low potassium approximately every day of hospitalization.  She  is on a chronic dose of 10 mEq per day.  We did bump this up to 20 mEq  per day in the hospital as well as gave her other  supplementation.  This  will need to be followed up as an outpatient.   PROBLEM #8.  HYPERLIPIDEMIA:  The patient was continued on her Vytorin.  She will go home on this but this could possibly be changed to  simvastatin since Vytorin does not have that much evidence for it.   PROBLEM #9.  GASTROESOPHAGEAL REFLUX DISEASE:  The patient was continued  on Protonix.   PROBLEM #10:  OBSTRUCTIVE SLEEP APNEA:  The patient was put on CPAP at  night.  She will go home with this as well.   PROBLEM #11.  CORONARY ARTERY DISEASE.  The patient was initially on  aspirin but because we are starting her on Coumadin will discontinue the  aspirin.   PROBLEM #12.  ASTHMA.  She was treated with Advair and she will go home   with this as well.   FOLLOWUP:  Other follow up issues: Please follow up the patient's new-  onset diagnosis of diabetes.  She will be following up with diabetes  management.  She also may need to be started on metformin if her  creatinine improves.  Please follow up her potassium and her creatinine  as well as her INR's; also may consider putting her on Zocor instead of  Vytorin she is on right now.     ______________________________  Rolly Salter, M.D.    ______________________________  Verner Chol, MD    JH/MEDQ  D:  12/02/2006  T:  12/03/2006  Job:  [id number]

## 2010-12-01 NOTE — Assessment & Plan Note (Signed)
[facility]                             PULMONARY OFFICE NOTE   NAME:[name], Michele HOBDY                  MRN:          [id number]  DATE:06/20/2007                            DOB:          1950/12/09    PROBLEMS:  1. Obstructive sleep apnea/CPAP.  2. 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  3. Allergic rhinitis.  4. Asthma.  5. Morbid obesity.  6. Esophageal reflux.  7. Coronary disease.  8. Hypertension.   HISTORY:  Michele [name] was hospitalized in early November by [facility]  for shortness of breath with chest tightness, coronary arteries were  considered stable, cardiac enzymes were negative. Michele [name] had paroxysmal  atrial fibrillation, but Coumadin was non-therapeutic and was restarted.  Michele [name] had an anemia workup previously by Dr. Collene Mares. Hyperlipidemia.  Diabetes type-2.   Discharge medications were:  1. Metoprolol 75 mg b.i.d.  2. Diovan HCT 160/25.  3. Claritin 10 mg.  4. Nasonex.  5. Skelaxin 800 mg.  6. Tylenol.  7. Albuterol inhaler.  8. Advair discus 250/50.  9. Capzasin 0.25% topical.  10.Coumadin. Michele [name] is being followed at the Coumadin clinic.  11.Zoloft 500 mg.  12.Niferex.  13.Zocor 40 mg.  14.Imdur 30 mg.  15.Diltiazem 240 mg.  16.Allegra 60 mg or Claritin b.i.d. p.r.n.  17.Protonix 40 mg.  18.Aspirin 81 mg.  19.Nitroglycerin p.r.n.  20.Potassium 20 mEq.   OBJECTIVE:  Weight 276 pounds, blood pressure 122/68, pulse 64, room air  saturation 99%. Michele [name] says that Michele [name] feels tired and short of breath. Michele [name]  says that Michele [name] has been tired since a stent was placed in July. Michele [name] uses  CPAP every night and feels that Michele [name] sleeps adequately with it. I  discussed impact of anemia on sense of fatigue. Lung fields are clear.  Heart sounds are regular without murmur. Michele [name] is quite heavy, but there  is no edema.   IMPRESSION:  1. Asthma.  2. Michele [name] will continue with allergy vaccine at 1 to 10.  3. Obstructive sleep apnea.  4. Paroxysmal atrial fibrillation.  5. Coronary artery disease.   PLAN:  1. We will get her chest x-ray report from the hospital visit.  2. Schedule PFT.  3. I have discussed regular walking, regular sleep schedule, and      weight loss as goals for her.     Clinton D. Annamaria Boots, MD, [place], [place]  Electronically Signed    CDY/MedQ  DD: 06/25/2007  DT: 06/26/2007  Job #: [id number]   cc:   Richardson Landry A. [name], M.D.

## 2010-12-01 NOTE — H&P (Signed)
NAMEBRIONY, PARVEEN           ACCOUNT NO.:  [id number]   MEDICAL RECORD NO.:  [id number]          PATIENT TYPE:  EMS   LOCATION:  MAJO                         FACILITY:  [facility]   PHYSICIAN:  Kaylyn Layer, M.D.  DATE OF BIRTH:  10-30-1950   DATE OF ADMISSION:  11/27/2006  DATE OF DISCHARGE:                              HISTORY & PHYSICAL   CHIEF COMPLAINT:  Nausea and vomiting.   HISTORY OF PRESENT ILLNESS:  This patient is a 60 year old female with a  history of hypertension, obstructive sleep apnea, GERD, mild coronary  artery disease who presents to the ED with a 1 day history of vomiting.  The patient apparently developed some nausea and chest pain and  tightness yesterday and called EMS.  They thought she was having a panic  attack and she did not go to the hospital.  Also, about 1 week ago, she  was seen at urgent care for a headache and dysequilibrium and they  told her she had a TIA and prescribed aspirin and meclizine.  Patient  states she has been feeling bad all week with a headache and poor  appetite and dizziness, possibly vertigo.  She has been vomiting yellow,  greenish emesis multiple times today.  The vomiting is worse with  movement.  Apparently, the patient was very anxious in the ED and  calling out and behaving inappropriately.  Her mother died 9 month ago  and the patient has been experiencing grief.  Reports that her last  bowel movement was yesterday.  She has been taking ibuprofen 400 mg p.o.  daily the last week for headaches.  Medicines received in the ED  included Zofran 4 mg IV x2, Ativan 1 mg IV x2, Phenergan 12.5 mg IV x1,  morphine 2 mg IV x1 and Reglan 10 mg IV x1.   PAST MEDICAL HISTORY:  1. Hypertension.  2. Obstructive sleep apnea on CPAP.  3. Allergic rhinitis.  4. Morbid obesity.  5. Asthma.  6. Gastroesophageal reflux disease.  7. Coronary artery disease, cath in July of 2002 showed an ejection      fraction of 60% with a no  significant disease.  Scattered lesions,      all of 50%.   PAST SURGICAL HISTORY:  1. Laparoscopic cholecystectomy, December of 2003.  2. Hemorrhoidectomy, December of 2003.   MEDICATIONS:  1. Aspirin 81 mg p.o. daily.  2. Meclizine 25 mg p.o. q.6 hours p.r.n.  3. Diovan HCT 160/25 mg p.o. daily.  4. Vytorin 10/40 mg 1 tab p.o. daily.  5. Zoloft 50 mg p.o. daily.  6. Vitamin B complex daily.  7. Allegra 60 mg p.o. b.i.d.  8. K-Dur 10 mEq p.o. daily.  9. Pseudovent 1 tab p.o. b.i.d. p.r.n. congestion.  10.Skelaxin 800 mg p.o. t.i.d. p.r.n.  11.Advair 250/50 p.o. b.i.d.  12.FeSO4 325 mg p.o. b.i.d.  13.Ibuprofen 400 mg p.o. daily x1 week.   FAMILY HISTORY:  Father died of an MI, also had CVA.  Mother died of  coronary artery disease, had an MI as well.  She has a sister with  Crohn.  She has another sister  with CVA and aneurysm.  She has a brother  who has had a CVA and aneurysm.  She has had 2 cousins who have had  breast cancer.   SOCIAL HISTORY:  Patient lives by herself.  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  She quit smoking in 1982.  She only smoked for 2 years.  Occasional  alcohol, less than 1 per week.  Last drink was half of a beer 4 days  ago.   REVIEW OF SYSTEMS:  Negative for abdominal pain, chest pain, shortness  of breath, dysuria and headache.   PHYSICAL EXAMINATION:  VITAL SIGNS:  Temperature 97.0.  Blood pressure  133-164/82-88.  Pulse 85-92.  Respirations 12-20.  O2 95-97% on room  air.  IN GENERAL:  Patient is sleeping, will arouse to voice and answer  questions appropriately.  She is morbidly obese.  HEENT:  Head:  Normocephalic, atraumatic.  Pupils equal, round and  reactive to light and accommodation.  Extraocular movements intact.  Dry  mucous membranes.  Yellow stained tongue.  No OP erythema or exudate.  NECK:  Supple.  No JVD.  No thyromegaly.  No  lymphadenopathy.  HEART:  Regular rate and rhythm.  Distant heart sounds.  No murmurs,  rubs or gallops.   LUNGS:  Clear to auscultation.  ABDOMEN:  Obese, soft, nontender.  Normoactive bowel sounds.  Nondistended.  EXTREMITIES:  No clubbing, cyanosis or edema.  Distal pulses are 2+ and  equal.  NEURO EXAM:  No focal deficits.   LABS AND STUDIES:  Sodium 136, potassium 3.5, chloride 99, bicarb 28,  BUN 8, creatinine 0.7, glucose 141.  Urinalysis significant for 30 mg of  protein; otherwise, within normal limits.  Micro shows many epithelial  cells and few bacteria.  Point of care enzymes negative x1.  White blood  count 8.8, hemoglobin 12.2, hematocrit 37.5, platelets 335.  EKG has a  normal sinus rhythm, some T wave inversions in V3, V4 and V5, but no  change from prior EKG in 2003.  Chest x-ray:  No cardiopulmonary  disease.  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  Moderate microvesicle disease.   ASSESSMENT/PLAN:  This is a 60 year old female with persistent nausea  and vomiting.   1. Nausea and vomiting.  The differential is broad and includes      obstruction, labyrinthitis/Meniere's disease, dysmotility, acute      coronary syndrome, peptic ulcer disease or psychogenic.  We will      check a KUB and evaluate for obstruction.  Check cardiac enzymes,      though the EKG is reassuring with no changes from prior.  We will      give Reglan 10 mg IV q.8 hours and p.r.n. Phenergan.  Patient will      be n.p.o.  We will check orthostatics in the a.m.  Check a CMP now,      as well as lipase, H. pylori and TSH.  We will hold other home      meds.  IV fluids of normal saline at 150, Protonix 40 IV b.i.d.  We      will check her vomit for blood.  2. Hypertension.  Blood pressure slightly elevated.  We are holding      her hypertensive medicines secondary to vomiting.  We will give      p.r.n. IV metoprolol if systolic blood pressure greater than 180. 3. Hypokalemia.  We will replete  with 2 runs of KCl 10 mEq.  4. Asthma.  This is stable, continue  Advair b.i.d. and p.r.n.      albuterol.  5. Prophylaxis.  Lovenox for deep venous thrombosis and Protonix      b.i.d. for gastrointestinal.   DISPOSITION:  Pending resolution of vomiting.      Kaylyn Layer, M.D.     KS/MEDQ  D:  11/27/2006  T:  11/27/2006  Job:  [id number]

## 2010-12-01 NOTE — Op Note (Signed)
NAMESAMON, DISHNER           ACCOUNT NO.:  [id number]   MEDICAL RECORD NO.:  [id number]          PATIENT TYPE:  AMB   LOCATION:  ENDO                         FACILITY:  [facility]   PHYSICIAN:  [name], M.D.  DATE OF BIRTH:  September 16, 1950   DATE OF PROCEDURE:  10/11/2007  DATE OF DISCHARGE:                               OPERATIVE REPORT   PROCEDURE PERFORMED:  Esophagogastroduodenoscopy with small bowel  biopsies.   ENDOSCOPIST:  [name], M.D.   INSTRUMENT USED:  Pentax video panendoscope.   INDICATIONS FOR PROCEDURE:  A 60 year old [place] American female with a  history of iron deficiency anemia undergoing EGD.  Small bowel biopsies  are planned to rule out sprue.   PREPROCEDURE PREPARATION:  Informed consent was procured from the  patient.  The patient fasted for 8 hours prior to the procedure.  The  risks and benefits of the procedure were discussed with the patient in  great detail.  The patient's Coumadin was held for 5 days prior to the  procedure after procuring permission to do so from Dr. Ileene Hutchinson office.   PREPROCEDURE PHYSICAL:  The patient had stable vital signs.  Neck  supple.  Chest clear to auscultation.  S1, S2 regular.  Abdomen soft,  obese, nontender with normal bowel sounds.   DESCRIPTION OF PROCEDURE:  The patient was placed in the left lateral  decubitus position and sedated with 50 mcg of Fentanyl and 5 mg of  Versed given intravenously in slow incremental doses.  Once the patient  was adequately sedated and maintained on low flow oxygen and continuous  cardiac monitoring the Pentax video panendoscope was advanced through  the mouthpiece over the tongue into the esophagus under direct vision.  The entire esophagus was widely patent with no evidence of ring,  stricture, mass, esophagitis or Barrett's mucosa.  The scope was then  advanced into the stomach.  The entire gastric mucosa appeared healthy.  Retroflexion in the high cardia revealed  no abnormalities.  The proximal  small bowel was normal.  There was no outlet obstruction.  Small bowel  biopsies were done (cold biopsies x3) to rule out sprue.  The patient  tolerated the procedure well without immediate complications.   IMPRESSION:  1. Normal EGD up to 60 cm. No masses, polyps, erosions, ulcerations or      diverticula noted.  2. Small bowel biopsies were done to rule out sprue.   RECOMMENDATIONS:  1. Await pathology results.  2. Avoid all nonsteroidals including aspirin for the next 2 weeks.  3. Proceed with a colonoscopy at this time.  4. Further recommendations will be made after the colonoscopy has been      done and the biopsy results procured.      [name], M.D.  Electronically Signed     JNM/MEDQ  D:  10/11/2007  T:  10/11/2007  Job:  [id number]   cc:   Richardson Landry A. [name], M.D.  Fax: [phone]   Octavia Heir, MD  Fax: [phone]

## 2010-12-01 NOTE — Consult Note (Signed)
Michele [name], Michele [name]           ACCOUNT NO.:  [id number]   MEDICAL RECORD NO.:  [id number]          PATIENT TYPE:  INP   LOCATION:  7124                         FACILITY:  [facility]   PHYSICIAN:  Michael L. Reynolds, M.D.DATE OF BIRTH:  March 14, 1951   DATE OF CONSULTATION:  11/29/2006  DATE OF DISCHARGE:                                 CONSULTATION   REQUESTING PHYSICIAN:  Verner Chol, MD.   REASON FOR EVALUATION:  Headache, nausea, and vomiting.   HISTORY OF PRESENT ILLNESS:  This is initial inpatient consultation  evaluation of this 60 year old woman with multiple medical problems.  The patient reports that she initially fell ill about ten days ago, at  which time she developed a headache along with some nausea and vomiting  and sensation of dizziness which was more of an unsteadiness than a true  vertigo.  She was seen in here and had a CT of the head, although there  are no emergency department notes from that evaluation.  She said that  she was discharged with a diagnosis of TIA and was given aspirin and  meclizine.  She says that since that time she has had fairly persistent  headache; although. it has waxed and waned in severity.  The headache is  located primarily in the left parieto-occipital area, but there is no  real radiation down into the back.  She also has a little bit of  headache behind her left eye.  She describes the headache as a vice-  like sensation and gripping sensation and describes the headache  behind the eye as a pressure sensation.  She is not having any  headache which is characterized by significant throbbing or pounding.  Over the same period of time, she has had ongoing dizziness and  unsteadiness which is without true vertigo.  She has also had persistent  nausea and vomiting.  She returned to the emergency department Nov 27, 2006 with persistent nausea and vomiting which did not remit with a few  medications, and ultimately was admitted for  further consideration.  Her  nausea and vomiting has continued, although, she thinks she is a little  better today.  She also complained of ongoing headache; although, it  seems to be less severe.  CT of the head was performed Nov 19, 2006  demonstrated an old lacunar infarct without acute findings.  MRI of the  brain was repeated today and is available for my review.  The patient  says she has never really had headaches like this before.  She denies  any associated transient visual loss but is having tinnitus.  She  reports no change with supine or standing position, and no change with  squatting or bending forward; although, she has not been doing much of  that.  She is having a little bit of stomach pain but denies any  diarrhea, and she is running some low-grade fevers in the hospital.  Neurologic consultation is requested for consideration.  She has also  developed atrial fibrillation while in the hospital and has been seen by  her routine cardiologist for that.   PAST  MEDICAL HISTORY:  Positive for:  1. Hypertension and a known history of nonobstructive coronary artery      disease.  2. She had a new onset atrial fibrillation as noted above.  3. She is morbidly obese and has a history of sleep apnea which is      treated with CPAP.  4. She also has a history of asthma.  5. Gastroesophageal reflux disease.  6. Anxiety/depression.  The latter has been exacerbated recently due      to the recent death of her father.   FAMILY/SOCIAL/REVIEW OF SYSTEMS:  As outlined in the admission history  and physical of Nov 27, 2006, by Dr. Mikeal Hawthorne which is reviewed.   MEDICATIONS:  Prior to admission, she was taking aspirin, meclizine,  Diovan HCT, Vytorin, Zoloft 50 mg daily, Allegra, vitamin B complex,  potassium, Advair, iron, and p.r.n. ibuprofen and Skelaxin.   In the hospital, her medications include:  aspirin, Cardizem, Lovenox,  Advair, HCTZ, Avapro, Reglan, Lopressor, Nasonex, Avelox,  Protonix, and  IV fluids.   PHYSICAL EXAMINATION:  VITAL SIGNS:  Temperature 97.7, blood pressure  154/98, pulse 91, respirations 20, O2 saturation 99% on room air.  GENERAL:  This is a morbidly obese but otherwise healthy appearing woman  supine on the hospital bed in no evident distress.  She is does not  appear acutely ill.  HEENT:  Head:  Cranium is normocephalic and atraumatic.  Oropharynx  benign.  She does not have any tenderness over the head or neck.  NECK:  Supple without carotid or supraclavicular bruits.  HEART:  Regular without murmurs.  NEUROLOGIC:  Mental Status:  She is awake and alert, she is slightly to  time, place, and person.  Recent and remote memory are intact.  Attention span and concentration are appropriate.  She is able to name  objects and repeat phrases.  She is a little bit anxious, but affect is  appropriate.  Cranial Nerves:  The edges of her optic discs are sharp  but I did not get a good enough look to firmly exclude disc elevation or  absence of spontaneous venous pulsations.  Visual fields are full  without nystagmus.  Visual fields are full to confrontation.  Pupils are  equal and reactive.  Face and the palate move normally and  symmetrically.  Motor:  Normal bulk and tone, normal strength in all  tested extremity muscles.  Sensation intact to light touch in all  extremities.  Coordination:  Rapid movements are performed well.  Finger-  to-nose and heel-to-shin are performed well.  Gait is deferred.  Reflexes 2+ and symmetric.  Toes are downgoing bilaterally.   LABORATORY REVIEW:  Laboratories per daily notes, particularly pertinent  are normal BMET from this morning.  CBC:  White count 11, hemoglobin  12.1, platelets 427,000.  MRI of the brain performed earlier today is  personally reviewed.  The study does not demonstrate any acute infarct. There is a moderate degree of white matter disease involving the  periventricular white matter along with,  to a lesser extent, the basal  ganglia and thalami.   IMPRESSION:  1. Headache.  Probably tension type in nature or possibly related to      her overall syndrome.  2. Nausea and vomiting.  No evidence of central source by imaging.   RECOMMENDATIONS:  Will treat her headaches symptomatically and at this  time will continue working up the nausea and vomiting.  If all else is  negative, suppose a  spinal tap could be performed just to firmly exclude  increased intracranial pressure (which is not strongly suggested by  examination but also not fully excludable) as well as encephalitis which  again seems unlikely.  Will also try to simplify her medications as much  as possible and try to gently mobilize.   Thank you for this consultation.      Michael L. Doy Mince, M.D.  Electronically Signed     MLR/MEDQ  D:  11/29/2006  T:  11/30/2006  Job:  [id number]

## 2010-12-01 NOTE — Discharge Summary (Signed)
Michele Michele [name]           ACCOUNT NO.:  [id number]   MEDICAL RECORD NO.:  [id number]          PATIENT TYPE:  INP   LOCATION:  3646                         FACILITY:  [facility]   PHYSICIAN:  Leslye Peer, MD       DATE OF BIRTH:  September 18, 1950   DATE OF ADMISSION:  05/26/2007  DATE OF DISCHARGE:  05/30/2007                               DISCHARGE SUMMARY   DICTATED BY:  Mickel Baas R. Ingold, N.P.   DISCHARGE DIAGNOSES:  1. Shortness of breath with chest tightness.  Stable coronary arteries      and negative cardiac enzymes for myocardial infarction.  2. Paroxysmal atrial fibrillation, had been on Coumadin as an      outpatient though nontherapeutic on arrival, resuming Coumadin as      an outpatient.  3. Anemia with workup by Dr. Collene Mares.  Stool has been negative.  Plan      Hematology consult.  4. Hyperlipidemia.  Will increase his Zocor.  5. Diabetes mellitus 2.  6. Obstructive sleep apnea on CPAP.  7. Obesity.   DISCHARGE CONDITION:  Improved.   PROCEDURES:  May 29, 2007 - combined left hear cath by Dr. Bryson Dames.   DISCHARGE MEDICATIONS:  1. Metoprolol 75 mg twice a day.  2. Diovan HCT 160/25 daily.  3. Claritin 10 mg daily.  4. Nasonex inhalation daily.  5. Skelaxin 800 mg twice a day.  6. Tylenol 650 mg as needed.  7. Albuterol inhaler daily.  8. Advair Diskus 250/50 twice a day.  9. Capzasin 0.025% topical as needed.  10.Coumadin 10 mg on May 30, 2007, then 10 mg May 31, 2007      and 7.5 mg on June 01, 2007 and then Coumadin Clinic on      June 02, 2007.  11.Zoloft 50 mg daily.  12.Niferex daily as before.  13.Zocor 40 mg 1 at bedtime, new dose.  14.Imdur 30 mg daily to prevent any chest pain.  Actually, we      decreased her Imdur to 30 mg daily.  15.Diltiazem 240 mg daily.  16.Allegra 60 mg or Claritin twice a day.  17.Protonix 40 mg daily.  18.Aspirin 81 mg plus calcium daily.  19.Nitroglycerin sublingually as needed.  20.K-Dur  20 mEq daily.   DISCHARGE INSTRUCTIONS:  1. Low sodium heart healthy diet.  2. Increase activity slowly.  No lifting for 2 days and driving for 2      days.  3. Wash cath site with soap and water.  Call if any bleeding, swelling      or drainage.  4. Follow up with Dr. Aundra Dubin June 08, 2007 at previous time.  5. Follow up with Coumadin Clinic June 02, 2007 at 11:40 a.m.  6. Continue CPAP as before.  7. You will be scheduled an appointment with Hematology for your      anemia.  8. Have blood work done Friday as well as Coumadin Clinic.   HISTORY OF PRESENT ILLNESS:  A 60 year old [place] American Michele [name], with  history of PAF and coronary disease with PTCA and non DES stent in July  of  2008, though the second lesion in the right coronary was not amenable  to plasty.  Initially, the patient did well at home, but since August  she has had increased episodes of chest heaviness with activity and had  cut back significantly on activity secondary to those episodes.  Now she  has mild episodes daily, but Tuesday and today of admission, May 26, 2007, she seemed worse, especially secondary to increased fatigue.  Last  week, she took nitroglycerin for this discomfort, and it did resolve her  symptoms, and they are associated with shortness of breath and  diaphoresis.   On May 26, 2007, at cardiac rehab, her last day there, she had  increased chest heaviness with exertion and shortness of breath.  Her  weight was up 4 pounds at least, but she had been drinking V8 juice, not  the low sodium juice, and she was instructed to come to the emergency  room to be evaluated.  Her last echo was July of 2008, and she had  normal LV function and no Myoview since her PCI.   PAST MEDICAL HISTORY:  1. Coronary as described.  2. PAF, now on Coumadin.  3. Diabetes mellitus 2, on diet controlled.  4. Anemia, on iron.  5. Obstructive sleep apnea with CPAP.  6. Obesity.   CONSULTATIONS:  GI  consult recently secondary to anemia.   ALLERGIES:  Bextra.   FAMILY HISTORY:  See H and P.   SOCIAL HISTORY:  See H and P.   REVIEW OF SYSTEMS:  See H and P.   PHYSICAL EXAMINATION:  At discharge, blood pressure 103/51, pulse 68,  respiratory rate was 21, temp 98.7, oxygen saturation on room air 100%.  Heart:  Regular rate and rhythm.  Lungs were clear.  Groin stable  without hematoma.   LABORATORY DATA:  Hemoglobin 9.1.  Hematocrit 28.4.  Platelets 358.  WBC  7.7.  Sodium 141.  Potassium 4.3.  BUN 17.  Creatinine 0.82.  Glucose  105.  LDL was 131, and her Zocor had been started at 40.  She had an  abdominal ultrasound that showed no abnormalities.   Other lab data:  Negative CK MBs x3 and on her I-Stat 8, her hemoglobin  was 11.9, hematocrit 35, with a drop in hematocrit to 29 by the 8th.   HOSPITAL COURSE:  The patient was admitted by Dr. Mathis Bud, on call for  Dr. Tami Ribas, secondary to chest pain, rule out MI.  Enzymes came back  negative.  She was admitted on heparin and nitroglycerin, onto the TCU.  She has obstructive sleep apnea, on CPAP.  She did have a little chest  pain the night after admission.  She was monitored.  Most nights she had  chest discomfort.  She continued with anemia down to 8.9, hemoglobin and  hematocrit 28 and 4.  She had had a recent workup  with Dr. Collene Mares, that  was negative.  Her stool for occult blood was negative.  Her daughter  has thalassemia, and the plan is for a Hematology consult.   By May 29, 2007, she underwent inpatient cath, patent stent to the  RCA, with ectasia distal to the stent.  Other vessels were clean.  EF  was 60%.  Medical therapy was recommended.  By the next day she was  stable, ambulating without difficulty.  Was ready for discharge home.  It was felt we would not Coumadin and Lovenox crossover as patient was  maintaining sinus rhythm.  We just added her Coumadin back.  She will  have hemoglobin and hematocrit checked  on Thursday or Friday with her  INR.  Dr. Gwenlyn Found saw her and assessed her and discharged her home.           ______________________________  Leslye Peer, MD     AB/MEDQ  D:  06/06/2007  T:  06/06/2007  Job:  [id number]   cc:   Leslye Peer, MD  Octavia Heir, MD  Osvaldo Angst, MD  Penne Lash, MD

## 2010-12-01 NOTE — Cardiovascular Report (Signed)
Michele, [name] NO.:  [id number]   MEDICAL RECORD NO.:  [id number]          PATIENT TYPE:  REC   LOCATION:  REHS                         FACILITY:  [facility]   PHYSICIAN:  Shelva Majestic, M.D.     DATE OF BIRTH:  11-05-1950   DATE OF PROCEDURE:  01/30/2007  DATE OF DISCHARGE:                            CARDIAC CATHETERIZATION   INDICATIONS:  [name] is a 60 year old African-American  patient of Dr. Tami Ribas who has a history of mild coronary artery disease  by initial cath several years ago.  She has a history of hypertension,  hyperlipidemia; she recently has been diagnosed as having type 2  diabetes mellitus.  In addition, she has a history of sleep apnea as  well as obesity.  Over the past several months, she had been noticing  increasing episodes of exertional shortness of breath with recurrent  episodes of chest tightness.  She was admitted to [facility] on July  13 after experiencing chest pain, tightness, and upon presentation was  found to be in rapid atrial fibrillation with rates up to 170 beats per  minute, treated with IV Diltiazem and beta-blocker therapy.  She is now  referred for definitive catheterization and possible percutaneous  coronary intervention.   PROCEDURE:  After premedication with Versed 2 mg intravenously, the  patient was prepped and draped in the usual fashion.  Her right femoral  artery was punctured anteriorly and a 5-French sheath was inserted  without difficulty.  Diagnostic catheterization was done utilizing 5-  [place] Judkins-4 left and right coronary catheters.  A pigtail catheter  was used for biplane cine ventriculography.  Due to the patient's  hypertensive history, distal aortography was also performed.  With the  demonstration of high-grade subtotal RCA stenoses with clot and a very,  very markedly tortuous RCA, the decision was made to attempt coronary  intervention to this RCA.  Sheath was upgraded to  a 6-French system; 600  mg of Plavix were administered; 5000 units of heparin were given in  addition to Integrilin double-bolus weight adjusted therapy.  The  percutaneous coronary intervention procedure was extremely difficult  secondary to marked tortuosity with 180-degree window  with loops in the  proximal RCA, sharp angles, as well as significant backup difficulties.  Numerous guides were necessary and ultimately a hockey-stick guide  provided the best backup.  Additionally, a ProWater wire inserted, but  this was unable to reach the lesion due to marked tortuosity.  A 2.0  balloon was then used for guidewire support, but ultimately this was  able to cross the 99% stenosis.  A 2.0 Maverick x 15 balloon over the  wire was inserted and ultimately several balloons 2.5 x 20, 3.0 x 20 and  3.5 x 30 were advanced and dilated in the proximal segment beyond the  loop of the vessel and sharp angle and also in the ectatic segment just  behind __________. .  Multiple attempts were then made to attempt  passing a stent; these were unsuccessful.  This included a 4.0 x 24 mm  Driver, and 4.0 x 23 mm Vision stent.  Again, using a wiggle wire was  then inserted for a buddy-system technique, but again this was  unsuccessful.  A 4.0 x 15 mm Maverick balloon was then inserted and  multiple dilatations were performed in the proximal segment.  After  additional dilatations with the 4.0 balloon, numerous reattempts were  made to cross with a stent.  Initially, the 4.0 x 24 Vision stent was  again inserted unsuccessfully.  A 2.0 x 15 mm over the wire Maverick  balloon was then used to help insert a Mailman wire to allow for  improved stiffness.  This led to significant wire bias.  However,  multiple additional attempts at passing the stent were still  unsuccessful.  A 4.0 x 15 mm Vision stent was then inserted again and  this was unsuccessful in reaching the lesion.  A Luge wire  was then  inserted in  place of the [place] due to the previous marked wire bias  that had been induced by the [place].  The 4.0 x 15 mm Maverick balloon  was then reinserted for redilatation.  Ultimately, a 3.5 x 12 mm Driver  stent was then ultimately able to be crossed the proximal lesion, though  it was unable to get more distal to cover the more distal proximal  lesion.  Two dilatations at 14 atmospheres were done.  Unfortunately,  there were no other stents available in 12 mm size to deploy.  Consequently, a 3.5 x 15 mm Driver stent was then advanced, but again  this was unsuccessful in passing due to the marked vessel tortuosity.  Attempts were then made to insert a 4.0 Quantum balloon to postdilated  the previously placed stent,  but the nose cone  was never able to get  cross the stent and so as not to cause disruption from apparent  excellent apposition, post stent dilatation was; therefore, not done.  At this point, due to excessive fluoro time, contrast load, and with the  patient stable without chest pain or ECG changes, the decision was made  to terminate the procedure.  She tolerated the procedure well with plans  for hydration and medical therapy of the more distal lesion which had  been previously dilated and was still markedly improved from previously.  The 90% proximal RCA stenosis was reduced to 0% with the stent and the  99% stenosis beyond this and the ectatic stenting was reduced to 50%  with balloon angioplasty. There was brisk TIMI-3 flow and no evidence  for dissection.   HEMODYNAMIC DATA:  Central aortic pressure was 161/98, mean 125, left  ventricular pressure was 165/18 and repeat 163/22.   ANGIOGRAPHIC DATA:  Left main coronary artery was a moderate-size vessel  that trifurcated into an LAD, a large bifurcating ramus intermediate  vessel and large left circumflex system.   The LAD gave rise to a bifurcating small first diagonal vessel.  There  was a moderate-size mid-diagonal  vessel that had 40% narrowing.  The  remainder of the LAD was free of significant disease.   The ramus intermediate vessel bifurcated and reached the LVA and then  reached the apex and was free of significant disease.   The circumflex vessel was free of significant disease and ended in two  distal marginal vessels and a posterolateral vessel.   The right coronary artery was markedly tortuous.  It was very  serpentigenous and had sharp angles.  There was a proximal loop that was  almost 360 degrees; following this there was  narrowing of 50% then after  a sharp angle, there was narrowing of 90% and subsequently to another  angle in the proximal third of the vessel was 99% stenosed with  thrombus.   Biplane and single left ventriculography revealed normal global LV  contractility with mild mid-to-basal posterior hypo-contractility on the  RAO projection.   Distal aortography did not demonstrate any significant aortoiliac  disease.  There was no evidence of renal artery stenosis.   Following very difficult percutaneous coronary intervention to the right  coronary artery secondary to vessel tortuosity, sharp angles, and backup  difficulties, the proximal segment after the 36-degree loop was  successfully dilated and stented with the 90% stenoses being reduced to  0%.  Beyond this segment, the 99% stenosis in area of ectasia and  previously clot was significantly improved following balloon angioplasty  to approximately 50%, but this area was not able to be stented as noted  above.  There was TIMI-3 flow and no evidence for dissection.   IMPRESSION:  1. Normal left ventricular function with mild mid-to-basal posterior      hypo-contractility.  2. Mild 40% narrowing in the second diagonal branch of the LAD.  3. Normal ramus intermediate vessel.  4. Normal circumflex vessel.  5. Very tortuous angulated serpentigenous right coronary artery with      50 to 90% stenoses proximally followed  by a 99% stenosis with      thrombus with ultimate insertion of a 3.5 x 12 mm Driver stent at      the proximal segments      after the 360-degree loop in the vessel and with PTCA alone due to      inability for the stent to reach the previous 99% stenosis, which      was successfully dilated to approximately 50% with double-bolus      Integrilin/weight-adjusted heparinization as well as nitroglycerin      and Plavix therapy.           ______________________________  Shelva Majestic, M.D.     TK/MEDQ  D:  03/29/2007  T:  03/29/2007  Job:  [id number]   cc:   Octavia Heir, MD

## 2010-12-01 NOTE — Group Therapy Note (Signed)
NAMEMACEE, VENABLES           ACCOUNT NO.:  [id number]   MEDICAL RECORD NO.:  [id number]          PATIENT TYPE:  EMS   LOCATION:  MAJO                         FACILITY:  [facility]   PHYSICIAN:  Syed T. Arfeen, M.D.   DATE OF BIRTH:  12-07-1950                                 PROGRESS NOTE   The patient came in for the followup appointment.  She continued to  endorse bad dreams and poor sleep.  She is still having issues using the  CPAP machine, as some nights she goes to her sister's and forgets to  take her CPAP machine.  However, she is tolerating her medication very  well.  She denies any depression.  She feels her anxiety is also better.  She described her dreams as sometimes very vivid and feels that somebody  is in her house to harm her.  She still endorsed sometimes  hallucinations, paranoia and feels threatened by people.  However, she  denies any suicidal thoughts, homicidal thoughts.  She has been seeing  Dr. Sima Matas in [place] office for counseling.  She has been  compliant with her medication which is Ativan 0.5 mg twice a day,  Abilify 10 mg daily and Zoloft 150 mg daily.  She reported no side  effects or EPS at this time.   ASSESSMENT:  Major depressive disorder with psychotic features, PTSD.   PLAN:  We talked about increasing the Abilify to 15 mg which help her psychosis  and paranoia.  Will continue her Zoloft 150 mg daily and Ativan 0.5 mg  twice a day. She will also continue to see Dr. Sima Matas for her  counseling.  We are still waiting for psychological test results from  him.  I explained the risks and benefits of the medication, and I will  see her in 2 months.      Syed T. Adele Schilder, M.D.  Electronically Signed     STA/MEDQ  D:  02/19/2009  T:  02/19/2009  Job:  [id number]

## 2010-12-01 NOTE — Assessment & Plan Note (Signed)
[facility]                             PULMONARY OFFICE NOTE   NAME:Michele [name], Michele [name]                  MRN:          [id number]  DATE:12/19/2006                            DOB:          08/11/1950    PROBLEMS:  1. Obstructive sleep apnea/CPAP.  2. 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  3. Allergic rhinitis.  4. Asthma.  5. Morbid obesity.  6. Esophageal reflux.  7. Coronary disease.  8. Hypertension.   HISTORY:  Six month follow up. She was hospitalized in May with  gastroenteritis and diagnosed with diabetes type-2 and new onset  paroxysmal atrial fibrillation, sinusitis, hypokalemia, hypertension,  and hyperlipidemia. Other diagnoses as before included asthma,  esophageal reflux, obstructive sleep apnea, and morbid obesity.   DISCHARGE MEDICATIONS:  1. Advair 250/50.  2. Avelox ended in June.  3. Potassium 20 mEq.  4. Diovan/HCT 160/25.  5. Nasonex.  6. Metoprolol 25 mg b.i.d.  7. Coumadin for her atrial fibrillation.  8. Iron 325 mg b.i.d.  9. Diltiazem ER 180 mg.  10.Protonix 40 mg x2.  11.Zoloft 50 mg.  12.Skelaxin 800 mg b.i.d.  13.Tylenol.  14.Meclizine 25 mg t.i.d.  15.Vytorin 10/40.  16.Allegra 60 mg b.i.d. p.r.n.  17.She is stop aspirin.   She was to get lab work including her INR at [facility] where she will follow up for primary care. A chest x-ray from that  admission reported no evidence for acute cardiopulmonary disease with  heart at upper limits of normal size. She says that she feels dry with  diuresis and antihistamines. Pollen is bothering her nose and chest, but  better now as the season advances. She continues allergy vaccine here at  1-10 with no problems. I think she is sorting out how she feels with her  various medical diagnoses and medications.   OBJECTIVE:  Weight 277 pounds, blood pressure 120/80, pulse 81, room air  saturation 97%. She is obese and in no distress. Heart sounds are  regular  now. I do not hear a murmur. Lung fields sound clear.   IMPRESSION:  Asthma and allergic rhinitis are currently stable,  paroxysmal atrial fibrillation now is in sinus rhythm by exam.   PLAN:  No changes to offer. She will continue her allergy vaccine and  schedule return in 6 months, earlier p.r.n.     Clinton D. Annamaria Boots, MD, [place], [place]  Electronically Signed    CDY/MedQ  DD: 12/25/2006  DT: 12/26/2006  Job #: [id number]   cc:   Richardson Landry A. [name], M.D.  Octavia Heir, MD

## 2010-12-01 NOTE — Consult Note (Signed)
Michele [name], Michele [name]           ACCOUNT NO.:  [id number]   MEDICAL RECORD NO.:  [id number]          PATIENT TYPE:  INP   LOCATION:  5809                         FACILITY:  [facility]   PHYSICIAN:  Ezzard Standing, M.D.DATE OF BIRTH:  Feb 15, 1951   DATE OF CONSULTATION:  11/28/2006  DATE OF DISCHARGE:                                 CONSULTATION   I was asked to see this 60 year old female at the request of the family  practice teaching service for evaluation of atrial fibrillation.  The  patient has a complex history of hypertension, obstructive sleep apnea,  and severe morbid obesity.  She had prior microcytic anemia, depression,  and previous asthma.  She has been followed by Dr. Alla German, her  regular cardiologist, with a history of chest discomfort radiating to  the jaw and has had Cardiolite stress testing in the past.  A cardiac  catheterization showed tortuous coronary arteries but was reported to  have insignificant obstruction.  She had no renal artery stenosis.  She  was demonstrated to be significantly hypertensive.  There was thought to  be 40-50% disease but the report said no obstruction.  She has been  followed with obstructive sleep apnea and has had CPAP in the past.  She  was admitted to the hospital with nausea and vomiting yesterday and had  chest pain and tightness.  About a week ago she was seen at urgent care  with headache, dysequilibrium, and had a head scan that evidently showed  a right thalamic lacunar infarct of uncertain age and moderate small  vessel ischemic change.  She has had an ongoing headache since then.  She was prescribed meclizine at that time.  She developed nausea, chest  pain, and had the vomiting and was evidently inappropriate and calling  out in the emergency room.  She has had significant grief since her  mother died about 1 month ago.  She on EKG had a normal sinus rhythm on  admission but was found to be in atrial fibrillation  this morning on EKG  and moved to the floor.  She has been aware of some tachy palpitations,  and has had shortness of breath and chest discomfort with exertion.  She  was moved to the floor and started on diltiazem and is currently in a  normal sinus rhythm.   PAST HISTORY:  1. Hypertension.  2. She has a history of severe morbid obesity.  3. Obstructive sleep apnea.  4. Asthma.  5. History of gastroesophageal reflux disease.  6. History of coronary artery disease.  7. Depression.   PAST SURGICAL HISTORY:  1. Laparoscopic cholecystectomy.  2. Hemorrhoidectomy.   MEDICATIONS:  Prior to admission:  Aspirin, meclizine, Diovan/HCT,  Vytorin, Zoloft, vitamin B, K-Dur,  Skelaxin, Advair, iron sulfate, and  ibuprofen.   FAMILY HISTORY:  Father died of a myocardial infarction, had a stroke.  Mother died of coronary disease and had an MI.  Sister with Crohn  disease.  Sister with CVA and aneurysm.  A brother has had CVA and  aneurysm.  Two cousins with breast cancer.   SOCIAL HISTORY:  The patient currently lives alone.  She quit smoking  years ago and drinks rare alcohol socially.  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   REVIEW OF SYSTEMS:  She has had episodic dizziness and she has had  ongoing headache and dysequilibrium.  She has had moderate frequency and  ongoing vomiting.  She has had significant anxiety and admits to grief  since her mother died.  She has had a history of a rectal abscess in the  past.  She has had iron-deficiency anemia in the past and some rectal  bleeding in the past.   PHYSICAL EXAMINATION:  GENERAL:  She is a very large African American  female in no acute distress.  VITAL SIGNS:  Blood pressure is currently 138/88, pulse is currently 90  and regular.  SKIN:  Warm and dry.  ENT:  EOMI.  PERRLA.  C&S clear.  Fundi not examined.  Pharynx negative.  NECK:  Supple without masses, JVD, thyromegaly, or bruits.  LUNGS:  Clear.   CARDIOVASCULAR:  Normal S1 and S2.  No S3.  ABDOMEN:  Soft and nontender.  EXTREMITIES:  Femoral and distal pulses are 2+.  There is no edema  noted.  She is lying in the bed and a formal neurologic exam was not  obtained.   EKG:  Reviewed from this morning shows atrial fibrillation with T wave  inversions laterally.   LABORATORY EVALUATION:  Shows a potassium of 3.5.  Her cardiac enzymes  were negative.  TSH is 1.199.   IMPRESSION:  1. Paroxysmal atrial fibrillation.  2. Recent lacunar infarction of unknown age.  3. Hypertensive heart disease.  4. History of coronary artery disease with moderate obstruction.  5. Hyperlipidemia, under treatment.  6. Obstructive sleep apnea.  7. Severe morbid obesity.  8. History of anxiety, depression, and recent grief reaction due to      death of mother.   RECOMMENDATIONS:  The patient has paroxysmal atrial fibrillation, has  nausea, vomiting, and a relatively recent stroke perhaps.  It is  uncertain whether she has a primary GI pathology or whether this is due  to her stroke.  She complains of an ongoing headache.  I would recommend  neurologic consultation and she is currently anticoagulated with  Lovenox.  With the risk factors of hypertension and previous stroke  probably will need anticoagulation with Coumadin but we will defer to  Dr. Tami Ribas.  Dr. Tami Ribas has been her regular cardiologist for the past  five years and he will pick up and see her in the morning.   Thank you for asking me to see her with you.      Ezzard Standing, M.D.  Electronically Signed    WST/MEDQ  D:  11/28/2006  T:  11/28/2006  Job:  [id number]   cc:   Octavia Heir, MD  Lina Sayre. [name], M.D.

## 2010-12-02 ENCOUNTER — Encounter (HOSPITAL_BASED_OUTPATIENT_CLINIC_OR_DEPARTMENT_OTHER): Payer: Medicare Other | Admitting: Psychology

## 2010-12-02 DIAGNOSIS — F331 Major depressive disorder, recurrent, moderate: Secondary | ICD-10-CM

## 2010-12-02 DIAGNOSIS — F4310 Post-traumatic stress disorder, unspecified: Secondary | ICD-10-CM

## 2010-12-03 ENCOUNTER — Encounter: Payer: Self-pay | Admitting: Adult Health

## 2010-12-03 ENCOUNTER — Ambulatory Visit (INDEPENDENT_AMBULATORY_CARE_PROVIDER_SITE_OTHER): Payer: Medicare Other | Admitting: Adult Health

## 2010-12-03 VITALS — BP 142/84 | HR 85 | Temp 97.8°F | Ht 64.0 in | Wt 297.0 lb

## 2010-12-03 DIAGNOSIS — J45909 Unspecified asthma, uncomplicated: Secondary | ICD-10-CM

## 2010-12-03 MED ORDER — PREDNISONE 10 MG PO TABS: ORAL_TABLET | ORAL | Status: AC

## 2010-12-03 MED ORDER — AMOXICILLIN-POT CLAVULANATE 875-125 MG PO TABS: 1.0000 | ORAL_TABLET | Freq: Two times a day (BID) | ORAL | Status: AC

## 2010-12-03 NOTE — Progress Notes (Signed)
  Subjective:    Patient ID: Michele [name], female    DOB: [birth year], 60 y.o.   MRN: [id number]  HPI 30 yoF never smoker. Followed here for obstructive sleep apnea, allergic rhinitis, asthma, complicated by obesity, GERD and CAD.   10/12/10 - Last here April 13, 2010. She had lost her insurance due to finances. Had trouble getting her meds. We had stopped her allergy shots. Because of overactive bladder she had stopped her CPAP. We discussed use of a quick disconnect at tubing so she could leave the mask on.  Breathing now is clear.   12/03/10 Acute OV  Presents for an acute office visit. Complains of increased SOB, wheezing, PND, prod cough with green mucus, sinus pressure/congestion, bilat ear congestion, f/c/s x2.5weeks. OTC not working. Taking Advair regularly. She now has insurance.  Cough and congestion are getting worse. Has a lot of sinus pain /pressure. Has felt feverish.  Appetite is down. No n/v/d. Sugars are doing okay averaging ~126.    Review of Systems  Constitutional:   No weight loss, night sweats,  Fevers, chills, fatigue, lassitude. HEENT:   No headaches,  Difficulty swallowing,  Tooth/dental problems,  Sore throat,  +sinus congestion              CV:  No chest pain,  Orthopnea, PND, swelling in lower extremities, anasarca, dizziness, palpitations  GI  No heartburn, indigestion, abdominal pain, nausea, vomiting, diarrhea, change in bowel habits,   RESP: No coughing up of blood.       Skin: no rash or lesions.  GU: no dysuria, change in color of urine, no urgency or frequency.  No flank pain.  MS:  No joint pain or swelling.  No decreased range of motion.    Psych:  No change in mood or affect. No depression or anxiety.  No memory loss.     Objective:   Physical Exam GEN: A/Ox3; pleasant , NAD, morbidly obese   HEENT:  [place]/AT,  EACs-clear, TMs-wnl, NOSE-clear drainage, max sinus tenderness, THROAT-clear, no lesions, no postnasal drip or exudate noted.     NECK:  Supple w/ fair ROM; no JVD; normal carotid impulses w/o bruits; no thyromegaly or nodules palpated; no lymphadenopathy.  RESP  Coarse BS w/ exp wheezing, no accessory muscle use, no dullness to percussion  CARD:  RRR, no m/r/g  , no peripheral edema, pulses intact, no cyanosis or clubbing.  GI:   Soft & nt; nml bowel sounds; no organomegaly or masses detected.  Musco: Warm bil, no deformities or joint swelling noted.   Neuro: alert, no focal deficits noted.    Skin: Warm, no lesions or rashes            Assessment & Plan:

## 2010-12-03 NOTE — Assessment & Plan Note (Signed)
Exacerbation w/ associated sinusitis.  Plan;  Augmentin 875mg  Twice daily  For 10 days , take with food Mucinex DM Twice daily  As needed  Cough/congestion  Prednisone taper over next week.  Please contact office for sooner follow up if symptoms do not improve or worsen or seek emergency care  follow up Dr. Maple Hudson  As planned and  As needed

## 2010-12-03 NOTE — Patient Instructions (Addendum)
Augmentin 875mg  Twice daily  For 10 days , take with food Mucinex DM Twice daily  As needed  Cough/congestion  Prednisone taper over next week.  Saline nasal rinses As needed   Please contact office for sooner follow up if symptoms do not improve or worsen or seek emergency care  follow up Dr. Maple Hudson  As planned and  As needed

## 2010-12-04 NOTE — Op Note (Signed)
NAME:  Michele [name], Michele [name]                     ACCOUNT NO.:  [id number]   MEDICAL RECORD NO.:  [id number]                   PATIENT TYPE:  AMB   LOCATION:  DAY                                  FACILITY:  [facility]   PHYSICIAN:  Odis Hollingshead, M.D.            DATE OF BIRTH:  [birth year]   DATE OF PROCEDURE:  06/26/2002  DATE OF DISCHARGE:                                 OPERATIVE REPORT   PREOPERATIVE DIAGNOSES:  1. Biliary diskinesia.  2. Bleeding internal hemorrhoids.   POSTOPERATIVE DIAGNOSES:  1. Biliary diskinesia.  2. Bleeding internal hemorrhoids.   PROCEDURE:  1. Laparoscopic cholecystectomy with intraoperative cholangiogram.  2. Internal hemorrhoidectomy.   SURGEON:  Odis Hollingshead, M.D.   ASSISTANT:  Shellia Carwin, M.D.   ANESTHESIA:  General.   INDICATIONS FOR PROCEDURE:  [name] is a 60 year old female with  bleeding internal hemorrhoids and these were felt to be the cause of her  iron deficiency anemia. Colonoscopy and upper endoscopy workup has been  done. She does have some right upper quadrant fullness and nausea following  eating. She has been treated with Nexium but the symptoms have not really  improved. Ultrasound of the abdomen demonstrated some fatty liver, normal  gallbladder. However, a gallbladder ejection fraction is markedly depressed  and she presents for elective cholecystectomy as well as internal  hemorrhoidectomy. The procedure and the risks were discussed with her at  length preoperatively. Cardiac clearance was received by Dr. Alla German.   TECHNIQUE:  She was brought to the operating room, placed supine on the  operating table and a general anesthetic was administered. The abdomen was  sterilely prepped and draped. A small supraumbilical incision was made  incising the skin sharply and carrying this down to the midline fascia where  a 1 to 1.5 cm incision was made in the midline fascia. The peritoneal cavity  was entered  sharply and under direct vision. A pursestring suture of #0  Vicryl was placed around the fascial edges. A Hasson trocar was introduced  into the peritoneal cavity and a pneumoperitoneum was created by  insufflation of CO2 gas. Next a laparoscope was introduced. The patient was  then placed in the appropriate position. She did have some bradycardia that  was treated medically. This was felt to be secondary to the fact that she  had taken a beta blocker this morning as well as anesthetic effect. This  resolved after the medical treatment.   An 11 mm trocar was placed through a similar size incision in the  epigastrium and two 5 mm trocars were placed in the right abdomen. The  gallbladder was identified and the fundus grasped and retracted toward the  right shoulder. The infundibulum was then completely mobilized and anterior  branch of the cystic artery was identified just anterior to the cystic duct.  It was clipped and divided. The cystic duct was then isolated and a window  created  around it. A clip was placed just above the cystic duct gallbladder  junction and a small incision made at the cystic duct gallbladder junction.  Bile was milked from the cystic duct. A cholangiocatheter was passed through  the anterior wall into the cystic duct and cholangiogram was performed.   Under real-time fluoroscopy, dilute contrast material was injected into the  cystic duct. The common bile duct and common hepatic and right and left  hepatic ducts were visualized except for the distal portion of the common  bile duct. The operating room table had a bar across it and I was not able  to completely visualize the distal part of the common bile duct but I could  see the contrast splashed quickly into the duodenum.   Next, the cholangiocath was removed, cystic duct was clipped three times  proximally and then divided. The posterior branch of the cystic artery was  identified, clipped and divided. Next  the gallbladder was dissected free  from the liver bed intact with the cautery. The gallbladder was replaced in  the Endopouch bag and the gallbladder fossa was examined. The bleeding  points were controlled with the cautery. The perihepatic area was irrigated,  the fluid evacuated. The gallbladder fossa was once again inspected and no  bleeding was noted. There was a small tear in the liver capsule secondary to  placement of one of the 5 mm trocars and a small amount of bleeding and this  was easily controlled with the cautery.   The gallbladder was then removed through the supraumbilical port and the  supraumbilical fascia closed under direct vision by tightening up and tying  down the pursestring suture. The rest of the irrigation fluid was evacuated  and the pneumoperitoneum was released. The trocars were removed. The skin  incisions were closed with 4-0 Monocryl subcuticular sutures. Steri-Strips  and sterile dressings were then applied.   Next, the drapes were removed and she was then placed in stirrups and placed  in the lithotomy position. The perianal area was sterilely prepped and  draped. I inserted the anoscope and noted that she had three moderate to  large size internal hemorrhoids. She had some external skin tags but no  significant hemorrhoidal disease there and I decided to leave the skin tags.  I started with the left lateral internal hemorrhoid, excised this and then  closed the mucosa with running 3-0 Chromic suture. Next the right  anterolateral hemorrhoid was identified, the vascular pedicle ligated with 3-  0 Chromic suture and the hemorrhoid was excised and the mucosa closed with  running locking 3-0 Chromic suture. The right posterolateral internal  hemorrhoid was identified, the vascular pedicles ligated with 3-0 Chromic  suture. The hemorrhoid was excised sharply and the mucosa was closed with  running locking 3-0 Chromic suture. In each case, I made sure I did  not injure the internal sphincter or the external sphincter and just took the  hemorrhoidal tissue.   I examined the area and there were a few bleeding points but these were  easily controlled with the cautery and once hemostasis was adequate, I put a  piece of Gelfoam in the anus and a bulky dressing.   She subsequently was extubated and taken to the recovery room in  satisfactory condition. She did have some episodic what was considered to be  ST depression although she quickly returned to baseline. I will put some  Nitropaste on her overnight, will plan to repeat an EKG in  the morning.                                                 Odis Hollingshead, M.D.    Katina Degree  D:  06/26/2002  T:  06/26/2002  Job:  [id number]   cc:   [name], M.D.  [place] [address] [place]  [place] [address]  Fax: [phone]   Lina Sayre. [name], M.D.  [address]  [place]  [place] [address]  Fax: [phone]   Clinton D. Young, M.D.  [place] [place] [address]  Fax: [phone]   Octavia Heir, M.D.  [phone] [address], [place] [address]  Fax: [phone]

## 2010-12-04 NOTE — Assessment & Plan Note (Signed)
[facility]                             PULMONARY OFFICE NOTE   NAME:[name], Michele BEYERSDORF                  MRN:          [id number]  DATE:06/21/2006                            DOB:          06-25-1951    PULMONARY/ALLERGY FOLLOWUP   PROBLEMS:  1. Obstructive sleep apnea/CPAP.  2. Engineer, building [employer].  3. Allergic rhinitis.  4. Asthma.  5. Morbid obesity.  6. Esophageal reflux.  7. Coronary disease.  8. Hypertension.   HISTORY:  She continues using CPAP at 11 CWP every night with no  problems with [employer].  Some reflux not bad.  We discussed  this.  Nasal decongestant helps used on a very occasional basis.  Her  main concern right now is with degenerative disc disease and low back  pain for which she is seeing Dr. [name].  It does not sound as if surgery  is planned.  She says her chest is comfortable and breathing is okay.  There has not been chest pain or palpitation.   MEDICATION:  1. Allegra 60 mg b.i.d. p.r.n.  2. Diovan HCT 80/12.5.  3. Aspirin.  4. Sular.  5. CPAP at 11 CWP.  6. Nasonex.  7. Advair 250/50.  8. Iron.  9. Potassium 10 mEq.  10.Pseudovent b.i.d. p.r.n.  11.Vytorin.  12.Skelaxin.  13.Darvocet.  14.Albuterol rescue inhaler.  15.Nitroglycerin.  16.Saline nasal spray.   Drug intolerant of MEPROBAMATE.   OBJECTIVE:  Weight 285 pounds, BP 136/80, pulse regular at 86, room air  saturation 98%.  She is a little bit hoarse with no real stridor.  Pharynx is not red.  I do not see drainage.  There in no neck vein  distention or adenopathy.  Lungs are clear, breathing is unlabored but  shallow consistent with her body build.  Heart sounds are regular  without murmur.   IMPRESSION:  1. Her obstructive sleep apnea component is well controlled on CPAP at      11.  She admits to using food for comfort and has not brought      herself to the effort of weight loss yet.  2. Allergic rhinitis.  3. Mild intermittent  asthma.   PLAN:  1. Continue efforts to lose weight since that will help all of her      health problems.  2. Schedule return 6 months, earlier p.r.n.     Clinton D. Annamaria Boots, MD, [place], [place]  Electronically Signed    CDY/MedQ  DD: 06/21/2006  DT: 06/22/2006  Job #: [id number]   cc:   Richardson Landry A. [name], M.D.  Octavia Heir, MD

## 2010-12-04 NOTE — Op Note (Signed)
[facility]  Patient:    Michele [name], Michele [name]                  MRN: [id number] Proc. Date: 11/22/00 Adm. Date:  [id number] Disc. Date: [id number] Attending:  Gomez Cleverly CC:         Dr. Nena Jordan at [facility]   Operative Report  PREOPERATIVE DIAGNOSIS:  Left groin abscess.  POSTOPERATIVE DIAGNOSIS:  Left groin abscess.  PROCEDURE:  Incision and drainage of left groin abscess with open packing.  SURGEON:  Earnstine Regal, M.D.  ANESTHESIA:  General per Sela Hilding, M.D.  ESTIMATED BLOOD LOSS:  Minimal.  PREPARATION:  Betadine.  COMPLICATIONS:  None.  INDICATIONS:  The patient is a 60 year old black female who presented to Urgent Care to Dr. Nena Jordan with five day history of expanding fluctuant mass in the left groin.  The patient is not diabetic.  She has not had any previous such abscesses.  She is now referred to general surgery for incision and drainage.  DESCRIPTION OF PROCEDURE:  The procedure was done in OR #1 at the [facility]  The patient was placed in the operating room in a supine position on the operating room table.  Following the administration of mask anesthesia, the patient was placed in a frog-leg position.  The left groin was prepped with Betadine.  There is a large fluctuant mass measuring approximately 8 x 6 x 6 cm in size in the left mons pubis area, and extended into the left groin and left labia.  An area of pointing is identified.  This was incised for 3 cm with a #10 blade.  Purulent fluid is evacuated and aspirated with a suction.  The incision is completed through the subcutaneous tissues with the electrocautery.  Using digital blunt dissection, the loculations of the abscessed cavity are broken down and debrided.  The abscessed cavity was irrigated copiously with warm saline.  The cavity was then packed with a Betadine soaked vaginal packing.  Sterile gauze  dressings are placed.  The patient is awakened from anesthesia and brought to the recovery room in stable condition.  The patient tolerated the procedure well. DD:  11/22/00 TD:  11/23/00 Job: [id number]

## 2010-12-04 NOTE — Op Note (Signed)
   NAMELILIA, LETTERMAN                     ACCOUNT NO.:  [id number]   MEDICAL RECORD NO.:  [id number]                   PATIENT TYPE:  AMB   LOCATION:  ENDO                                 FACILITY:  [facility]   PHYSICIAN:  Juanita Craver, M.D.                   DATE OF BIRTH:  11/21/1950   DATE OF PROCEDURE:  04/02/2002  DATE OF DISCHARGE:                                 OPERATIVE REPORT   PROCEDURE:  Esophagogastroduodenoscopy.   ENDOSCOPIST:  Juanita Craver, M.D.   INSTRUMENT USED:  Olympus video panendoscope.   INDICATION FOR PROCEDURE:  Iron-deficiency anemia in a 60 year old African-  American female.  Rule out peptic ulcer disease, esophagitis, gastritis,  etc.   PREPROCEDURE PREPARATION:  Informed consent was procured from the patient.  The patient was fasted for eight hours prior to the procedure.   PREPROCEDURE PHYSICAL:  VITAL SIGNS:  The patient had stable vital signs.  NECK:  Supple.  CHEST:  Clear to auscultation.  S1, S2 regular.  ABDOMEN:  Soft with normal bowel sounds.   DESCRIPTION OF PROCEDURE:  The patient was placed in the left lateral  decubitus position and sedated with 50 mg of Demerol and 6 mg of Versed  intravenously.  Once the patient was adequately sedate and maintained on low-  flow oxygen and continuous cardiac monitoring, the Olympus video  panendoscope was advanced through the mouthpiece, over the tongue, into the  esophagus under direct vision.  The entire esophagus appeared normal with no  evidence of ring, stricture, masses, esophagitis, or Barrett's mucosa.  The  scope was then advanced into the stomach.  There was mild diffuse gastritis  with no ulcers, erosions, masses, or polyps seen.  The proximal small bowel  appeared normal and without adhesions.   IMPRESSION:  Mild diffuse gastritis, otherwise normal EGD.   RECOMMENDATIONS:  Proceed with a colonoscopy at this time.                                                Juanita Craver,  M.D.    JM/MEDQ  D:  04/02/2002  T:  04/03/2002  Job:  [id number]   cc:   Richardson Landry A. [name], M.D.

## 2010-12-04 NOTE — Discharge Summary (Signed)
[place]  Patient:    Michele [name], Michele [name]                  MRN: [id number] Adm. Date:  [id number] Disc. Date: [id number] Attending:  Phifer, Izora Gala Welcome Dictator:   Tania Ade, M.D. CC:         Dr. Vedia Coffer, Urgent Care   Discharge Summary  DISCHARGE DIAGNOSES: 1. Asthma exacerbation, now resolved, baseline arterial blood gasses with pH    7.426, pCO2 44, pO2 57 on room air. 2. Essential hypertension. 3. Depression. 4. Microcytic anemia with hemoglobin 11.3, mean corpuscular volume 68. 5. Status post left groin abscess, remote, in May 2002. 6. History of hemorrhoids diagnosed by colonoscopy in 1999, by Dr. Collene Mares. 7. Obesity.  DISCHARGE MEDICATIONS:  1. Advair 250/50 one puff twice a day.  2. Albuterol inhaler two puffs every four hours as needed.  3. Tequin 400 mg one p.o. q.d. for the next six days then stop.  4. Prednisone 10 mg six tablets twice a day x 2 days, six tablets once a day     x 2, four tablets once a day x 2 day, two tablets once a day x 2 days,     one tablet once a day x 3 days and then stop.  5. Robitussin expectorant q.6h.  6. Tiazac 120 mg q.d.  7. Prempro q.d.  8. Xanax 0.5 mg q.h.s.  9. Zoloft 25 mg q.h.s. 10. Monopril 10 mg q.d.  FOLLOWUP:  Follow up with Dr. Edwyna Shell at the [facility] at [phone], on June 13, Thursday, at 1:45 p.m.  At that time, a lung exam would be done to check if wheezes have resolved.  It will be assessed if the patient has continued the steroid taper correctly.  Antibiotic use would be questioned.  The SPO2 needs to be checked.  HISTORY OF PRESENT ILLNESS:  This is a 60 year old, African-American female with past medical history as listed above who presented with a two-month history of shortness of breath worsening over the past 18 hours.  She complained of wheezing, productive cough, subjective fever and yellow sputum production.  She was diagnosed with  asthma two months ago and was started on albuterol inhaler.  She has never been intubated.  She has a history of pneumonia that was being treated with amoxicillin followed by Z-pack, but no relief.  The patient received continuous one-hour albuterol nebulizer in ED with minimal relief.  She denied orthopnea or PND.  MEDICATIONS: 1. Tiazac 120 mg q.d. 2. Prempro q.d. 3. Xanax 0.5 mg q.h.s. 4. Zoloft 25 mg q.h.s. 5. Monopril q.d.  PAST GYNECOLOGIC HISTORY:  She is postmenopausal for two years.  She started on Prempro a year ago, stopped by self.  Six months ago she had a menstrual cycle on Nov 25, 2000, and then started back on Prempro two weeks ago.  She is occasionally sexually active.  She is P3, A1, L2.  FAMILY HISTORY:  Mom is alive at age 35 with heart disease.  She has had an MI with two-vessel disease.  She had a cardiac catheterization.  She also has diabetes mellitus.  Dad is deceased at age 57 and has had several MIs and has gout.  No history of beta-thalassemia or sickle cell in the family.  Two cousins deceased of breast cancer, one at the age of 85 and one at the age of 51.  SOCIAL HISTORY:  She lives alone in a house.  She is divorced.  She has one son and one daughter.  The next of kin is her daughter, Lavonia Drafts.  She has HOM and [facility].  She takes a regular diet with decreased caffeine. Occasionally exercises.  No tobacco, alcohol or recreational drugs.  PHYSICAL EXAMINATION:  VITAL SIGNS:  Temperature 98, heart rate 95, blood pressure 146/78, respiratory rate 24, oxygen saturations 91% on room air.  HEART:  Tachycardia on cardiac exam.  LUNGS:  Bilateral diffuse wheezes with prolonged expiratory phase with good bilateral air entry.  LABORATORY DATA AND X-RAY FINDINGS:  Chest x-ray was normal.  EKG showed a normal sinus rhythm at 99 beats per minute.  Nonspecific T wave abnormality. A little increased T wave inversion in II, III and aVF.  White count  12.6, ANCA 0.7, hemoglobin 11.3, hematocrit 35, MCV 68, platelets 399.  ABG pH 7.02, pCO2 44, pO2 57 on room air after one nebulizer treatment.  Peak flow 110, status post bronchodilator treatment.  Potassium 3.5, bicarb 26, BUN and creatinine 7 and 0.6, glucose 143.  Sputum Gram stain grew gram-positive cocci.  TSH was normal.  HOSPITAL COURSE:  #1 - ASTHMA EXACERBATION:  The patient was started on albuterol and Atrovent nebulizers.  She was given intravenous Solu-Medrol and was also started on Tequin for coverage of community-acquired pathogens.  The patient responded well to treatment.  The Solu-Medrol was tapered quickly on the second day and the wheezes gradually resolved.  Breathing became much easier and less tight on examination.  Her oxygen saturations also picked up to about 100% on 2 L on day #2 of admission.  The patient felt better and upon discharge, her lungs were clear with minimal wheezes, good air entry bilaterally and was advised to continue albuterol inhaler as needed every four hours.  She was also started on Advair diskus to be used twice a day.  Tequin was continued for a total of 10 days.  Prednisone taper was given for a total of about 10-11 days.  Robitussin was given as an expectorant.  #2 - HYPERTENSION:  Hypertension remained stable during the course here.  She was continued on Tiazac and Monopril and blood pressure was well-controlled.  #3 - ANEMIA:  She did have microcytic anemia with a low MCV very suggestive of beta-thalassemia.  Ferritin was normal and TSH was normal.  Stool guaiac was negative.  #4 - DEPRESSION:  She was continued on Zoloft and discharged on the same medications.  DISPOSITION:  The patient was emphasized on the importance of having a primary M.D. to take care of her multiple problems and she agreed.  SPECIAL INSTRUCTIONS:  The patient needs follow up with pulmonary function tests and appointment has been made for her on June 24, at  11 a.m. for PFTs at  the PFT lab.  The patient has been given all instructions about it.  She is scheduled for an exercise treadmill test on June 28, with Dr. Rollene Fare.  She is advised to keep the appointment with him.  Follow up with the patient as above.  DISCHARGE LABORATORY DATA AND X-RAY FINDINGS:  Sodium 141, potassium 4.1, chloride 106, bicarb 28, BUN 11, creatinine 0.8, glucose 205.  DISCHARGE PHYSICAL EXAMINATION:  The patient is afebrile with temperature 97, blood pressure 144/80, heart rate 100 and respiratory rate 18, oxygen saturations 97% on room air. DD:  12/26/00 TD:  12/26/00 Job: [id number]

## 2010-12-04 NOTE — Cardiovascular Report (Signed)
[name], [name]           ACCOUNT NO.:  [id number]   MEDICAL RECORD NO.:  [id number]          PATIENT TYPE:  INP   LOCATION:  4982                         FACILITY:  [facility]   PHYSICIAN:  Bryson Dames, M.D.DATE OF BIRTH:  October 31, 1950   DATE OF PROCEDURE:  07/28/2006  DATE OF DISCHARGE:                            CARDIAC CATHETERIZATION   PROCEDURES PERFORMED:  1. Selective coronary angiography by Judkins' technique.  2. Retrograde left heart catheterization.  3. Left ventricular angiography.  4. Successful right femoral artery Angio-Seal closure, tolerated well      and very successful.   COMPLICATIONS:  None.   PATIENT PROFILE:  Michele [name] is a 60 year old African-American female  who is 5 feet, 5 inches tall and weighs 273 pounds, who is a cardiology  patient of Dr. Alla German and a primary care physician patient of  Dr. Richardson Landry Dobb.  She also sees Dr. Keturah Barre for pulmonary issues.  The patient entered [facility] on May 26, 2007, with  complaints of chest pain and tiredness.   PAST MEDICAL HISTORY:  Her past history reveals that she had had  percutaneous stenting of her right proximal coronary artery, which was a  very difficult procedure performed by Dr. Shelva Majestic on January 30, 2007.  During this hospitalization, the patient has had negative cardiac  enzymes and EKGs that are minimally abnormal, showing only nonspecific T-  wave abnormalities.  It was felt that she deserved re-look at her  coronary anatomy, in view of her symptoms 4 months status post  successful percutaneous coronary stenting.  This procedure was performed  electively without complications and was tolerated reasonably well.  Versed and fentanyl were given two times, to ensure patient comfort and  improved the tolerability of the case itself.   RESULTS:  Pressures.  The left ventricular pressure was 125/80 and the  mean was 100.  The central aortic pressure was 641/5,  end-diastolic  pressure 17.  There was a 10-mm aortic valve gradient, which is within  normal limits.   ANGIOGRAPHIC RESULTS:  The patient's left coronary arterial tree is  huge!  There is a short left main coronary artery that looks like it may  well be anywhere from 5-7 mm in diameter, no lesions seen in this very  short left main.  There is an atrial circumflex branch which arises  very, very proximally on this left circumflex.  The origin is hard to  see, but it is patent.  The left circumflex itself is comprised of a  slow-filling circumflex with a well-preserved intra-arterial lumen.  There is a larger than circumflex obtuse marginal branches, which gives  rise to several branches which shows luminal irregularities but nothing  high grade.   The left anterior descending coronary artery is also a very, very large  vessel, in terms of his diameter giving rise to two diagonal branches,  the first coming off near the septal perforator branch, the second  coming off just before septal perforator branch #2. Again, luminal  irregularities seen throughout this vessel, amounting to no more than  20% to 30%, and TIMI flow into  the distal vessel is class II.   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  There is then a 180-degree turn in the vessel, just after the  Shepherd's crook, and at this point one sees a radio-opaque stent.  There is no more than 20-30% in-stent restenosis of this indwelling  stent, and then the vessel becomes ectatic and thereafter has a fairly  normal smooth size as the RCA descends, courses to the atrioventricular  groove and then becomes the distal RCA.  No significant lesions were  seen throughout the midportion and distal portion of the vessel.  The  distal RCA trifurcates into a posterolateral and posterior descending  branch, which bifurcates.  Again, no  high-grade lesions were seen.   The left ventricular angiogram shows vigorous contractility of all wall  segments and a 30-degree RAO view with ejection fraction of 60%, no  evidence of mitral regurgitation, no LV thrombus, and ascending aorta  off the aortic valve looks normal.   FINAL IMPRESSIONS:  1. Trivial coronary artery disease diffusely, with a recent deployment      of a intracoronary artery stent, 3.5 x 12-mm driver stent, with no      more than 20-30% in-stent restenosis.  2. Normal LVEF 20%.  3. Morbid obesity.   PLAN:  The patient should be reassured about her coronary artery  anatomy.  She complains of shortness of breath, so that should be  further evaluated during hospitalization, but my suspicion is that it is  related to morbid obesity and debilitation.  The patient will follow up  with her respective physicians after discharge.           ______________________________  Bryson Dames, M.D.     WHG/MEDQ  D:  05/29/2007  T:  05/29/2007  Job:  [id number]   cc:   Octavia Heir, MD  Vic Ripper, [place] - [facility] D. Annamaria Boots, MD, FCCP, FACP

## 2010-12-04 NOTE — Cardiovascular Report (Signed)
[place]  Patient:    Michele [name], Michele [name]                  MRN: [id number] Proc. Date: 01/20/01 Adm. Date:  [id number] Disc. Date: [id number] Attending:  Alla [name] H CC:         Michele [name], M.D.   Cardiac Catheterization  PROCEDURE: 1. Left heart catheterization. 2. Coronary angiography. 3. Left ventriculogram. 4. Abdominal aortogram.  COMPLICATIONS:  None.  INDICATIONS:  Michele [name] is a 60 year old black female patient of Michele [name] with a history of remote tobacco abuse, hypertension, hypercholesterolemia, and recently has complained of chest pain radiating to her jaw with nausea.  The patient underwent stress Cardiolite on January 13, 2001, revealing evidence of inferior wall scar with the suggestion of anterior wall ischemia.  She has a normal EF.  She is now referred for cardiac catheterization to define her coronary anatomy.  DESCRIPTION OF PROCEDURE:  After giving informed written consent, the patient was brought to the cardiac catheterization and her right and left groins were shaved, prepped and draped in the usual sterile fashion.  ECG monitoring was established.  Using modified Seldinger technique, a #6 French arterial sheath was inserted in the right femoral artery.  6 French diagnostic catheter was then used to perform diagnostic angiography.  This reveals a large left main with no significant disease.  The LAD is a large vessel which is very tortuous and courses to the apex giving rise to one diagonal branch.  The LAD is noted to be coarse and rigor with a 60% distal stenotic lesion.  This seems to improve with IV nitro to approximately 40 to 50%.  The first diagonal is a medium size vessel which again is tortuous and appears to have a 40% ostial lesion.  The left circumflex is a large vessel which again is tortuous and gives rise to large tortuous obtuse marginals.  AV groove circumflex has no  significant disease.  The first OM is a large vessel which bifurcates in its midsegment and has a 20% proximal stenosis.  The second OM is a large vessel with no significant disease.  The right coronary artery again is noted to be a large vessel which is dominant and gives rise to both PDA as well as a posterolateral branch.  The RCA is known to be tortuous as well with a large Shepherds crook in its proximal portion.  There is up to 40% stenosis in the proximal Shepherd crook portion of the RCA.  There is a large ectotic segment in the mid RCA with a 50% stenotic lesion just prior to the ectasia.  There is a distal 50% stenotic lesion.  The PDA and posterolateral branch have no significant disease.  The left ventriculogram reveals preserved EF at 60%.  Abdominal aortogram reveals no evidence of renal artery stenosis with a normal distal aorta.  HEMODYNAMICS:  Systemic arterial pressure 150/90.  LV systemic pressure 150/14. LVEDP 20.  Following the case, the right femoral site was then closed using a Perclose device without complications.  1 gram of Ancef was given prophylactically.  CONCLUSION: 1. No significant coronary artery disease.  There is however, marked    tortuosity of the left anterior descending, circumflex, and right coronary    artery systems. 2. Normal left ventricular systolic function. 3. No evidence of renal artery stenosis. 4. Systemic hypertension. DD:  01/20/01 TD:  01/20/01 Job: [id number]

## 2010-12-04 NOTE — Op Note (Signed)
   [name], Michele [name]                     ACCOUNT NO.:  [id number]   MEDICAL RECORD NO.:  [id number]                   PATIENT TYPE:  AMB   LOCATION:  ENDO                                 FACILITY:  [facility]   PHYSICIAN:  Juanita Craver, M.D.                   DATE OF BIRTH:  10-27-1950   DATE OF PROCEDURE:  04/02/2002  DATE OF DISCHARGE:                                 OPERATIVE REPORT   PROCEDURE:  Colonoscopy.   ENDOSCOPIST:  Juanita Craver, M.D.   INSTRUMENT USED:  Olympus video colonoscope.   INDICATIONS FOR PROCEDURE:  A 60 year old African-American female with a  history of iron deficiency anemia and rectal bleeding, rule out colonic  polyps, masses, hemorrhoids, etc.   PREPROCEDURE PREPARATION:  Informed consent was procured from the patient.  The patient fasted for eight hours prior to the procedure and prepped with a  bottle of magnesium citrate and a gallon of NuLytely the night prior to the  procedure.   PREPROCEDURE PHYSICAL:  The patient had stable vital signs. Neck supple.  Chest clear to auscultation. S1, S2 regular. Abdomen soft with normal bowel  sounds.   DESCRIPTION OF PROCEDURE:  The patient was placed in the left lateral  decubitus position and sedated with an additional 10 mg of Demerol and 1 mg  of Versed intravenously. Once the patient was adequately sedated and  maintained on low flow oxygen and continuous cardiac monitoring, the Olympus  video colonoscope was advanced in the rectum to the cecum with slight  difficulty secondary to the patient's body habitus. There was a prominent  bleeding internal hemorrhoid seen on retroflexion of the rectum and large  external hemorrhoid noted. The rest of the colonic mucosa to the cecum  appeared normal. No masses, polyps, erosions, ulcers, or diverticula were  seen.   IMPRESSION:  Prominent bleeding internal hemorrhoids and moderate size  external hemorrhoids, otherwise, normal colonoscopy.    RECOMMENDATIONS:  1. Anusol-HC 2.5% suppositories have been prescribed for the patient on p.r.     q.h.s. #30 prescribed with three refill.  2. A high fiber diet has been discussed with the patient and brochures given     to her for her education.  3. Outpatient follow-up in the next two weeks.  4. A CBC will be checked and then further recommendations made as needed.                                               Juanita Craver, M.D.   JM/MEDQ  D:  04/02/2002  T:  04/03/2002  Job:  [id number]   cc:   Richardson Landry A. [name], M.D.

## 2010-12-16 ENCOUNTER — Telehealth: Payer: Self-pay | Admitting: Internal Medicine

## 2010-12-16 ENCOUNTER — Encounter (HOSPITAL_BASED_OUTPATIENT_CLINIC_OR_DEPARTMENT_OTHER): Payer: Medicare Other | Admitting: Psychology

## 2010-12-16 DIAGNOSIS — F331 Major depressive disorder, recurrent, moderate: Secondary | ICD-10-CM

## 2010-12-16 DIAGNOSIS — F4310 Post-traumatic stress disorder, unspecified: Secondary | ICD-10-CM

## 2010-12-16 MED ORDER — FEXOFENADINE HCL 60 MG PO TABS: 60.0000 mg | ORAL_TABLET | Freq: Every day | ORAL | Status: DC

## 2010-12-16 NOTE — Telephone Encounter (Signed)
Refill sent to [facility]--lmom that this has been done

## 2010-12-21 ENCOUNTER — Encounter (HOSPITAL_COMMUNITY): Payer: Medicare Other | Admitting: Psychiatry

## 2010-12-21 DIAGNOSIS — F331 Major depressive disorder, recurrent, moderate: Secondary | ICD-10-CM

## 2010-12-30 ENCOUNTER — Encounter (HOSPITAL_BASED_OUTPATIENT_CLINIC_OR_DEPARTMENT_OTHER): Payer: Medicare Other | Admitting: Psychology

## 2010-12-30 DIAGNOSIS — F4310 Post-traumatic stress disorder, unspecified: Secondary | ICD-10-CM

## 2010-12-30 DIAGNOSIS — F331 Major depressive disorder, recurrent, moderate: Secondary | ICD-10-CM

## 2011-01-13 ENCOUNTER — Encounter (HOSPITAL_BASED_OUTPATIENT_CLINIC_OR_DEPARTMENT_OTHER): Payer: Medicare Other | Admitting: Psychology

## 2011-01-13 DIAGNOSIS — F331 Major depressive disorder, recurrent, moderate: Secondary | ICD-10-CM

## 2011-01-13 DIAGNOSIS — F4310 Post-traumatic stress disorder, unspecified: Secondary | ICD-10-CM

## 2011-01-27 ENCOUNTER — Encounter (HOSPITAL_BASED_OUTPATIENT_CLINIC_OR_DEPARTMENT_OTHER): Payer: Medicare Other | Admitting: Psychology

## 2011-01-27 DIAGNOSIS — F331 Major depressive disorder, recurrent, moderate: Secondary | ICD-10-CM

## 2011-01-27 DIAGNOSIS — F4310 Post-traumatic stress disorder, unspecified: Secondary | ICD-10-CM

## 2011-02-10 ENCOUNTER — Encounter (HOSPITAL_BASED_OUTPATIENT_CLINIC_OR_DEPARTMENT_OTHER): Payer: Medicare Other | Admitting: Psychology

## 2011-02-10 DIAGNOSIS — F4310 Post-traumatic stress disorder, unspecified: Secondary | ICD-10-CM

## 2011-02-10 DIAGNOSIS — F331 Major depressive disorder, recurrent, moderate: Secondary | ICD-10-CM

## 2011-03-04 ENCOUNTER — Encounter (HOSPITAL_BASED_OUTPATIENT_CLINIC_OR_DEPARTMENT_OTHER): Payer: Medicare Other | Admitting: Psychology

## 2011-03-04 DIAGNOSIS — F4310 Post-traumatic stress disorder, unspecified: Secondary | ICD-10-CM

## 2011-03-04 DIAGNOSIS — F339 Major depressive disorder, recurrent, unspecified: Secondary | ICD-10-CM

## 2011-03-08 ENCOUNTER — Telehealth: Payer: Self-pay | Admitting: Internal Medicine

## 2011-03-08 MED ORDER — ALBUTEROL SULFATE HFA 108 (90 BASE) MCG/ACT IN AERS: 2.0000 | INHALATION_SPRAY | Freq: Four times a day (QID) | RESPIRATORY_TRACT | Status: DC | PRN

## 2011-03-08 NOTE — Telephone Encounter (Signed)
Albuterol mdi called to pharm.

## 2011-03-15 ENCOUNTER — Encounter (HOSPITAL_COMMUNITY): Payer: Medicare Other | Admitting: Psychiatry

## 2011-03-17 ENCOUNTER — Encounter (HOSPITAL_COMMUNITY): Payer: Medicare Other | Admitting: Psychiatry

## 2011-03-23 ENCOUNTER — Encounter (INDEPENDENT_AMBULATORY_CARE_PROVIDER_SITE_OTHER): Payer: Medicare Other | Admitting: Psychology

## 2011-03-23 DIAGNOSIS — F331 Major depressive disorder, recurrent, moderate: Secondary | ICD-10-CM

## 2011-03-23 DIAGNOSIS — F4310 Post-traumatic stress disorder, unspecified: Secondary | ICD-10-CM

## 2011-03-30 ENCOUNTER — Other Ambulatory Visit: Payer: Self-pay | Admitting: Hematology and Oncology

## 2011-03-30 ENCOUNTER — Encounter (HOSPITAL_BASED_OUTPATIENT_CLINIC_OR_DEPARTMENT_OTHER): Payer: Medicare Other | Admitting: Hematology and Oncology

## 2011-03-30 DIAGNOSIS — D638 Anemia in other chronic diseases classified elsewhere: Secondary | ICD-10-CM

## 2011-03-30 DIAGNOSIS — D509 Iron deficiency anemia, unspecified: Secondary | ICD-10-CM

## 2011-03-30 LAB — CBC WITH DIFFERENTIAL/PLATELET
Basophils Absolute: 0 10*3/uL (ref 0.0–0.1)
EOS%: 2.6 % (ref 0.0–7.0)
Eosinophils Absolute: 0.2 10*3/uL (ref 0.0–0.5)
HGB: 10.4 g/dL — ABNORMAL LOW (ref 11.6–15.9)
NEUT#: 4 10*3/uL (ref 1.5–6.5)
RDW: 18.1 % — ABNORMAL HIGH (ref 11.2–14.5)
WBC: 8 10*3/uL (ref 3.9–10.3)
lymph#: 3.1 10*3/uL (ref 0.9–3.3)

## 2011-03-30 LAB — COMPREHENSIVE METABOLIC PANEL
AST: 18 U/L (ref 0–37)
Albumin: 3.8 g/dL (ref 3.5–5.2)
Alkaline Phosphatase: 87 U/L (ref 39–117)
BUN: 14 mg/dL (ref 6–23)
Potassium: 3.9 mEq/L (ref 3.5–5.3)

## 2011-03-30 LAB — FERRITIN: Ferritin: 382 ng/mL — ABNORMAL HIGH (ref 10–291)

## 2011-03-30 LAB — IRON AND TIBC
%SAT: 25 % (ref 20–55)
TIBC: 299 ug/dL (ref 250–470)

## 2011-03-30 LAB — VITAMIN B12: Vitamin B-12: 474 pg/mL (ref 211–911)

## 2011-04-05 ENCOUNTER — Encounter: Payer: Self-pay | Admitting: Internal Medicine

## 2011-04-05 ENCOUNTER — Ambulatory Visit (INDEPENDENT_AMBULATORY_CARE_PROVIDER_SITE_OTHER): Payer: Medicare Other | Admitting: Internal Medicine

## 2011-04-05 VITALS — BP 120/80 | HR 69 | Ht 62.0 in | Wt 298.4 lb

## 2011-04-05 DIAGNOSIS — G4733 Obstructive sleep apnea (adult) (pediatric): Secondary | ICD-10-CM

## 2011-04-05 DIAGNOSIS — J309 Allergic rhinitis, unspecified: Secondary | ICD-10-CM

## 2011-04-05 DIAGNOSIS — Z23 Encounter for immunization: Secondary | ICD-10-CM

## 2011-04-05 NOTE — Patient Instructions (Signed)
Follow up in 2 months. Please call as needed  Work on weight loss for your sleep apnea  We will restart your allergy vaccine. I will have the allergy lab contact you when your vaccine is ready.  Flu Vax

## 2011-04-07 ENCOUNTER — Encounter (HOSPITAL_COMMUNITY): Payer: Medicare Other | Admitting: Psychiatry

## 2011-04-07 DIAGNOSIS — F331 Major depressive disorder, recurrent, moderate: Secondary | ICD-10-CM

## 2011-04-09 ENCOUNTER — Encounter: Payer: Self-pay | Admitting: Internal Medicine

## 2011-04-09 ENCOUNTER — Encounter (HOSPITAL_BASED_OUTPATIENT_CLINIC_OR_DEPARTMENT_OTHER): Payer: No Typology Code available for payment source | Admitting: Hematology and Oncology

## 2011-04-09 DIAGNOSIS — D509 Iron deficiency anemia, unspecified: Secondary | ICD-10-CM

## 2011-04-09 DIAGNOSIS — D638 Anemia in other chronic diseases classified elsewhere: Secondary | ICD-10-CM

## 2011-04-09 NOTE — Assessment & Plan Note (Signed)
She is not adequately compliant with CPAP or weight loss and is not motivated to try this time. I will keep working on that and spent extra time educating again today.

## 2011-04-09 NOTE — Progress Notes (Signed)
Subjective:    Patient ID: Michele [name], female    DOB: Dec 08, 1950, 60 y.o.   MRN: [id number]  HPI    Review of Systems     Objective:   Physical Exam        Assessment & Plan:   Subjective:    Patient ID: Michele [name], female    DOB: 1950/10/23, 60 y.o.   MRN: [id number]  HPI 63 yoF never smoker. Followed here for obstructive sleep apnea, allergic rhinitis, asthma, complicated by obesity, GERD and CAD.   10/12/10 - Last here April 13, 2010. She had lost her insurance due to finances. Had trouble getting her meds. We had stopped her allergy shots. Because of overactive bladder she had stopped her CPAP. We discussed use of a quick disconnect at tubing so she could leave the mask on.  Breathing now is clear.   12/03/10 Acute OV  Presents for an acute office visit. Complains of increased SOB, wheezing, PND, prod cough with green mucus, sinus pressure/congestion, bilat ear congestion, f/c/s x2.5weeks. OTC not working. Taking Advair regularly. She now has insurance.  Cough and congestion are getting worse. Has a lot of sinus pain /pressure. Has felt feverish.  Appetite is down. No n/v/d. Sugars are doing okay averaging ~126.   04/05/2011-60 yoF never smoker. Followed here for obstructive sleep apnea, allergic rhinitis, asthma, complicated by obesity, GERD and CAD. She had seen the nurse practitioner in May infection and responded to prednisone with antibiotic at that time. Since then she has gotten steroid injection in her knees for arthritis. She stayed off of CPAP because of her overactive bladder. She has not been able to lose any weight. Treatment alternatives have been reviewed and medical issues of untreated sleep apnea have been explained again.  She wants to restart allergy vaccine here because of nasal congestion postnasal drainage sneezing and itch. She had stopped allergy vaccine before when she lost insurance.   Review of Systems  Constitutional:   No  weight loss, night sweats,  Fevers, chills, + fatigue, lassitude. HEENT:   No headaches,  Difficulty swallowing,  Tooth/dental problems,  Sore throat,  +sinus congestion             CV:  No chest pain,  Orthopnea, PND, swelling in lower extremities, anasarca, dizziness, palpitations GI  No heartburn, indigestion, abdominal pain, nausea, vomiting, diarrhea, change in bowel habits,  RESP: No coughing up of blood.      Skin: no rash or lesions. GU: no dysuria, change in color of urine, no urgency or frequency.  No flank pain. MS:  No joint pain or swelling.  No decreased range of motion.   Psych:  No change in mood or affect. No depression or anxiety.  No memory loss.     Objective:  General- Alert, Oriented, Affect-appropriate, Distress- none acute; morbidly obese Skin- rash-none, lesions- none, excoriation- none Lymphadenopathy- none Head- atraumatic            Eyes- Gross vision intact, PERRLA, conjunctivae clear secretions            Ears- Hearing, canals-normal            Nose- mucus bridging, no- Septal dev,  polyps, erosion, perforation             Throat- Mallampati IV , mucosa clear , drainage- none, tonsils- atrophic; raspy voice Neck- flexible , trachea midline, no stridor , thyroid nl, carotid no bruit Chest - symmetrical excursion , unlabored  Heart/CV- RRR , no murmur , no gallop  , no rub, nl s1 s2                           - JVD- none , edema- none, stasis changes- none, varices- none           Lung- clear to P&A, wheeze- none, cough- none , dullness-none, rub- none           Chest wall-  Abd- tender-no, distended-no, bowel sounds-present, HSM- no Br/ Gen/ Rectal- Not done, not indicated Extrem- cyanosis- none, clubbing, none, atrophy- none, strength- nl; walks with cane Neuro- grossly intact to observation            Assessment & Plan:

## 2011-04-09 NOTE — Assessment & Plan Note (Signed)
We can restart allergy vaccine here based on her previous testing. I will review her previous test and shot experience but we will probably start at a dilution of 1:500 or weaker.  Flu vaccine discussed

## 2011-04-12 ENCOUNTER — Ambulatory Visit: Payer: Medicare Other | Admitting: Internal Medicine

## 2011-04-13 ENCOUNTER — Encounter (HOSPITAL_BASED_OUTPATIENT_CLINIC_OR_DEPARTMENT_OTHER): Payer: No Typology Code available for payment source | Admitting: Psychology

## 2011-04-13 ENCOUNTER — Ambulatory Visit (INDEPENDENT_AMBULATORY_CARE_PROVIDER_SITE_OTHER): Payer: No Typology Code available for payment source

## 2011-04-13 DIAGNOSIS — F4310 Post-traumatic stress disorder, unspecified: Secondary | ICD-10-CM

## 2011-04-13 DIAGNOSIS — F331 Major depressive disorder, recurrent, moderate: Secondary | ICD-10-CM

## 2011-04-13 DIAGNOSIS — J309 Allergic rhinitis, unspecified: Secondary | ICD-10-CM

## 2011-04-20 LAB — BUN: BUN: 17

## 2011-04-20 LAB — CREATININE, SERUM: GFR calc non Af Amer: >60

## 2011-04-27 ENCOUNTER — Encounter (INDEPENDENT_AMBULATORY_CARE_PROVIDER_SITE_OTHER): Payer: No Typology Code available for payment source | Admitting: Psychology

## 2011-04-27 ENCOUNTER — Ambulatory Visit (INDEPENDENT_AMBULATORY_CARE_PROVIDER_SITE_OTHER): Payer: No Typology Code available for payment source

## 2011-04-27 DIAGNOSIS — J309 Allergic rhinitis, unspecified: Secondary | ICD-10-CM

## 2011-04-27 DIAGNOSIS — F331 Major depressive disorder, recurrent, moderate: Secondary | ICD-10-CM

## 2011-04-27 DIAGNOSIS — F4310 Post-traumatic stress disorder, unspecified: Secondary | ICD-10-CM

## 2011-04-27 LAB — URINALYSIS, MICROSCOPIC ONLY
Bilirubin Urine: NEGATIVE
Glucose, UA: NEGATIVE
Hgb urine dipstick: NEGATIVE
Ketones, ur: NEGATIVE
Leukocytes, UA: NEGATIVE
Protein, ur: NEGATIVE
pH: 6

## 2011-04-27 LAB — CBC
HCT: 28.4 — ABNORMAL LOW
HCT: 29.1 — ABNORMAL LOW
MCHC: 31.7
MCHC: 31.9
MCHC: 32.1
MCV: 66.7 — ABNORMAL LOW
MCV: 66.9 — ABNORMAL LOW
MCV: 67.4 — ABNORMAL LOW
Platelets: 376
Platelets: 392
RBC: 4.35
RBC: 4.82
RDW: 17.9 — ABNORMAL HIGH
WBC: 7.3
WBC: 9.3

## 2011-04-27 LAB — LIPID PANEL
Cholesterol: 207 — ABNORMAL HIGH
Total CHOL/HDL Ratio: 4

## 2011-04-27 LAB — COMPREHENSIVE METABOLIC PANEL
ALT: 18
Alkaline Phosphatase: 78
CO2: 30
GFR calc non Af Amer: >60
Glucose, Bld: 106 — ABNORMAL HIGH
Potassium: 4
Sodium: 138
Total Bilirubin: 0.5

## 2011-04-27 LAB — DIFFERENTIAL
Basophils Absolute: 0.1
Eosinophils Relative: 3
Lymphocytes Relative: 51 — ABNORMAL HIGH
Lymphs Abs: 3.1
Monocytes Absolute: 0.5
Monocytes Absolute: 0.6
Monocytes Relative: 8
Neutrophils Relative %: 38 — ABNORMAL LOW

## 2011-04-27 LAB — I-STAT 8, (EC8 V) (CONVERTED LAB)
BUN: 13
Bicarbonate: 29.2 — ABNORMAL HIGH
Chloride: 104
HCT: 35 — ABNORMAL LOW
Hemoglobin: 11.9 — ABNORMAL LOW
Operator id: 288331
Potassium: 4
Sodium: 138

## 2011-04-27 LAB — HEPARIN LEVEL (UNFRACTIONATED)
Heparin Unfractionated: 0.46
Heparin Unfractionated: 0.56
Heparin Unfractionated: 0.68

## 2011-04-27 LAB — CARDIAC PANEL(CRET KIN+CKTOT+MB+TROPI)
CK, MB: 1.1
CK, MB: 1.2
Total CK: 108
Total CK: 140
Troponin I: 0.02

## 2011-04-27 LAB — BASIC METABOLIC PANEL
BUN: 12
BUN: 13
CO2: 29
CO2: 29
CO2: 32
Calcium: 9.6
Chloride: 103
Chloride: 104
Creatinine, Ser: 0.72
Creatinine, Ser: 0.82
Creatinine, Ser: 0.86
GFR calc Af Amer: >60
GFR calc Af Amer: >60
GFR calc Af Amer: >60
GFR calc non Af Amer: >60
Glucose, Bld: 106 — ABNORMAL HIGH
Potassium: 3.8
Potassium: 4.2
Potassium: 4.3
Sodium: 140

## 2011-04-27 LAB — B-NATRIURETIC PEPTIDE (CONVERTED LAB): Pro B Natriuretic peptide (BNP): <30

## 2011-04-27 LAB — HEMOGLOBINOPATHY EVALUATION
Hgb F Quant: 0 (ref 0.0–2.0)
Hgb S Quant: 0 % (ref 0.0–0.0)

## 2011-04-27 LAB — TSH: TSH: 2.027

## 2011-04-27 LAB — HEMOGLOBIN A1C: Hgb A1c MFr Bld: 6.6 — ABNORMAL HIGH

## 2011-04-27 LAB — POCT CARDIAC MARKERS: Myoglobin, poc: 73.8

## 2011-04-27 LAB — CK TOTAL AND CKMB (NOT AT ARMC): Total CK: 139

## 2011-04-28 ENCOUNTER — Telehealth: Payer: Self-pay | Admitting: *Deleted

## 2011-04-28 ENCOUNTER — Telehealth: Payer: Self-pay | Admitting: Internal Medicine

## 2011-04-28 MED ORDER — ALBUTEROL SULFATE (2.5 MG/3ML) 0.083% IN NEBU: 2.5000 mg | INHALATION_SOLUTION | Freq: Four times a day (QID) | RESPIRATORY_TRACT | Status: DC | PRN

## 2011-04-28 NOTE — Telephone Encounter (Signed)
Called [employer] [phone]  Pt id   [id number].  Waiting on fax to be sent.

## 2011-04-28 NOTE — Telephone Encounter (Signed)
Refill request received from Michele [name] for pt's Albuterol 0.083% Neb Sol. They last filled this medication for 25 vials on 04/03/2009 with directions to use 1 vial four times daily as needed.  Pt last seen by [facility] on 04/05/2011. Allergies  Allergen Reactions  . Meprobamate Nausea And Vomiting

## 2011-04-29 NOTE — Telephone Encounter (Signed)
Form received and placed on Dr. Roxy Cedar cart for review and signature. Will hold msg in Katie's box for follow-up.

## 2011-04-30 ENCOUNTER — Ambulatory Visit (INDEPENDENT_AMBULATORY_CARE_PROVIDER_SITE_OTHER): Payer: No Typology Code available for payment source

## 2011-04-30 DIAGNOSIS — J309 Allergic rhinitis, unspecified: Secondary | ICD-10-CM

## 2011-05-03 LAB — BASIC METABOLIC PANEL
CO2: 30
CO2: 30
Calcium: 8.9
Calcium: 9.2
Calcium: 9.7
Calcium: 9.7
Chloride: 97
Creatinine, Ser: 0.63
Creatinine, Ser: 0.83
Creatinine, Ser: 1.11
GFR calc Af Amer: >60
GFR calc Af Amer: >60
GFR calc non Af Amer: >60
GFR calc non Af Amer: >60
Glucose, Bld: 89
Glucose, Bld: 94
Sodium: 133 — ABNORMAL LOW
Sodium: 135
Sodium: 136

## 2011-05-03 LAB — URINALYSIS, ROUTINE W REFLEX MICROSCOPIC
Bilirubin Urine: NEGATIVE
Nitrite: NEGATIVE
Protein, ur: NEGATIVE
Specific Gravity, Urine: 1.012
Urobilinogen, UA: 0.2

## 2011-05-03 LAB — CK TOTAL AND CKMB (NOT AT ARMC)
CK, MB: 1.4
Total CK: 553 — ABNORMAL HIGH

## 2011-05-03 LAB — HEMOGLOBINOPATHY EVALUATION
Hgb F Quant: 0 (ref 0.0–2.0)
Hgb S Quant: 0 % (ref 0.0–0.0)

## 2011-05-03 LAB — URINE MICROSCOPIC-ADD ON

## 2011-05-03 LAB — CBC
Hemoglobin: 11.1 — ABNORMAL LOW
MCHC: 32.1
MCHC: 32.5
MCV: 68.2 — ABNORMAL LOW
RDW: 16 — ABNORMAL HIGH
RDW: 16.3 — ABNORMAL HIGH

## 2011-05-03 LAB — CARDIAC PANEL(CRET KIN+CKTOT+MB+TROPI)
CK, MB: 2
CK, MB: 2.2
Relative Index: 0.3
Total CK: 534 — ABNORMAL HIGH
Total CK: 734 — ABNORMAL HIGH
Total CK: 775 — ABNORMAL HIGH
Troponin I: 0.17 — ABNORMAL HIGH

## 2011-05-03 LAB — IRON AND TIBC
Iron: 43
TIBC: 272
UIBC: 229

## 2011-05-03 LAB — DIFFERENTIAL
Basophils Absolute: 0
Basophils Relative: 0
Eosinophils Absolute: 0
Monocytes Absolute: 1 — ABNORMAL HIGH
Neutro Abs: 5.7
Neutrophils Relative %: 56

## 2011-05-03 LAB — FERRITIN: Ferritin: 108 (ref 10–291)

## 2011-05-03 LAB — MAGNESIUM: Magnesium: 2.1

## 2011-05-04 LAB — APTT: aPTT: 26

## 2011-05-04 LAB — I-STAT 8, (EC8 V) (CONVERTED LAB)
Acid-Base Excess: 6 — ABNORMAL HIGH
Chloride: 97
HCT: 41
Hemoglobin: 13.9
Operator id: 291361
Potassium: 3.2 — ABNORMAL LOW
Sodium: 135
pH, Ven: 7.491 — ABNORMAL HIGH

## 2011-05-04 LAB — COMPREHENSIVE METABOLIC PANEL
AST: 29
Albumin: 3.9
Alkaline Phosphatase: 89
Chloride: 96
GFR calc Af Amer: >60
Potassium: 2.8 — ABNORMAL LOW
Sodium: 135
Total Bilirubin: 0.7
Total Protein: 7.7

## 2011-05-04 LAB — BASIC METABOLIC PANEL
BUN: 4 — ABNORMAL LOW
Chloride: 99
Creatinine, Ser: 0.62
GFR calc Af Amer: >60
GFR calc non Af Amer: >60
Potassium: 3.1 — ABNORMAL LOW

## 2011-05-04 LAB — CBC
Hemoglobin: 11.1 — ABNORMAL LOW
MCHC: 33
MCV: 68.4 — ABNORMAL LOW
Platelets: 440 — ABNORMAL HIGH
Platelets: 451 — ABNORMAL HIGH
RBC: 5.43 — ABNORMAL HIGH
RDW: 15.6 — ABNORMAL HIGH
RDW: 16.1 — ABNORMAL HIGH
WBC: 10.4
WBC: 9.3

## 2011-05-04 LAB — DIFFERENTIAL
Basophils Absolute: 0
Basophils Relative: 0
Eosinophils Absolute: 0
Monocytes Absolute: 0.3
Neutro Abs: 7.3

## 2011-05-04 LAB — POCT I-STAT CREATININE: Creatinine, Ser: 0.6

## 2011-05-04 LAB — PROTIME-INR
INR: 1.2
Prothrombin Time: 14.9

## 2011-05-04 LAB — LIPID PANEL
Cholesterol: 124
LDL Cholesterol: 58
Total CHOL/HDL Ratio: 2.3

## 2011-05-04 LAB — HEPARIN LEVEL (UNFRACTIONATED): Heparin Unfractionated: 1.51 — ABNORMAL HIGH

## 2011-05-04 LAB — POCT CARDIAC MARKERS: Myoglobin, poc: 314

## 2011-05-04 LAB — TSH: TSH: 1.315

## 2011-05-05 ENCOUNTER — Ambulatory Visit (INDEPENDENT_AMBULATORY_CARE_PROVIDER_SITE_OTHER): Payer: No Typology Code available for payment source

## 2011-05-05 DIAGNOSIS — J309 Allergic rhinitis, unspecified: Secondary | ICD-10-CM

## 2011-05-06 NOTE — Telephone Encounter (Signed)
Formed filled out and faxed to insurance company-papers in triage holding area.

## 2011-05-07 ENCOUNTER — Ambulatory Visit (INDEPENDENT_AMBULATORY_CARE_PROVIDER_SITE_OTHER): Payer: No Typology Code available for payment source

## 2011-05-07 ENCOUNTER — Telehealth: Payer: Self-pay | Admitting: Internal Medicine

## 2011-05-07 DIAGNOSIS — J309 Allergic rhinitis, unspecified: Secondary | ICD-10-CM

## 2011-05-07 NOTE — Telephone Encounter (Signed)
See 10/10 phone note REGARDING THIS

## 2011-05-07 NOTE — Telephone Encounter (Signed)
Noted since pt is aware will sign off message. I called [facility] and made them aware of this but they state they do not file under part B. [facility] state they will call pt and make her aware where she can go and will cover her albuterol inhaler. Nothing further was needed

## 2011-05-11 ENCOUNTER — Ambulatory Visit (INDEPENDENT_AMBULATORY_CARE_PROVIDER_SITE_OTHER): Payer: No Typology Code available for payment source

## 2011-05-11 ENCOUNTER — Encounter (INDEPENDENT_AMBULATORY_CARE_PROVIDER_SITE_OTHER): Payer: No Typology Code available for payment source | Admitting: Psychology

## 2011-05-11 DIAGNOSIS — J309 Allergic rhinitis, unspecified: Secondary | ICD-10-CM

## 2011-05-11 DIAGNOSIS — F331 Major depressive disorder, recurrent, moderate: Secondary | ICD-10-CM

## 2011-05-18 ENCOUNTER — Ambulatory Visit (INDEPENDENT_AMBULATORY_CARE_PROVIDER_SITE_OTHER): Payer: No Typology Code available for payment source

## 2011-05-18 DIAGNOSIS — J309 Allergic rhinitis, unspecified: Secondary | ICD-10-CM

## 2011-05-21 ENCOUNTER — Ambulatory Visit (INDEPENDENT_AMBULATORY_CARE_PROVIDER_SITE_OTHER): Payer: No Typology Code available for payment source

## 2011-05-21 DIAGNOSIS — J309 Allergic rhinitis, unspecified: Secondary | ICD-10-CM

## 2011-05-26 ENCOUNTER — Ambulatory Visit (INDEPENDENT_AMBULATORY_CARE_PROVIDER_SITE_OTHER): Payer: No Typology Code available for payment source

## 2011-05-26 DIAGNOSIS — J309 Allergic rhinitis, unspecified: Secondary | ICD-10-CM

## 2011-05-27 ENCOUNTER — Encounter: Payer: Self-pay | Admitting: Emergency Medicine

## 2011-05-27 ENCOUNTER — Ambulatory Visit (INDEPENDENT_AMBULATORY_CARE_PROVIDER_SITE_OTHER): Payer: No Typology Code available for payment source

## 2011-05-27 DIAGNOSIS — N959 Unspecified menopausal and perimenopausal disorder: Secondary | ICD-10-CM

## 2011-05-27 DIAGNOSIS — F4310 Post-traumatic stress disorder, unspecified: Secondary | ICD-10-CM

## 2011-05-27 DIAGNOSIS — I480 Paroxysmal atrial fibrillation: Secondary | ICD-10-CM

## 2011-05-27 DIAGNOSIS — F32A Depression, unspecified: Secondary | ICD-10-CM

## 2011-05-27 DIAGNOSIS — J309 Allergic rhinitis, unspecified: Secondary | ICD-10-CM

## 2011-05-27 DIAGNOSIS — I739 Peripheral vascular disease, unspecified: Secondary | ICD-10-CM

## 2011-05-27 DIAGNOSIS — Q2112 Patent foramen ovale: Secondary | ICD-10-CM

## 2011-05-27 DIAGNOSIS — E559 Vitamin D deficiency, unspecified: Secondary | ICD-10-CM | POA: Insufficient documentation

## 2011-05-27 DIAGNOSIS — Q211 Atrial septal defect: Secondary | ICD-10-CM

## 2011-05-27 DIAGNOSIS — R9089 Other abnormal findings on diagnostic imaging of central nervous system: Secondary | ICD-10-CM | POA: Insufficient documentation

## 2011-05-27 DIAGNOSIS — D509 Iron deficiency anemia, unspecified: Secondary | ICD-10-CM

## 2011-05-27 DIAGNOSIS — N368 Other specified disorders of urethra: Secondary | ICD-10-CM

## 2011-05-27 DIAGNOSIS — F329 Major depressive disorder, single episode, unspecified: Secondary | ICD-10-CM | POA: Insufficient documentation

## 2011-05-28 ENCOUNTER — Ambulatory Visit (INDEPENDENT_AMBULATORY_CARE_PROVIDER_SITE_OTHER): Payer: No Typology Code available for payment source

## 2011-05-28 ENCOUNTER — Encounter (HOSPITAL_COMMUNITY): Payer: Self-pay | Admitting: Psychology

## 2011-05-28 ENCOUNTER — Ambulatory Visit (INDEPENDENT_AMBULATORY_CARE_PROVIDER_SITE_OTHER): Payer: No Typology Code available for payment source | Admitting: Psychology

## 2011-05-28 DIAGNOSIS — F331 Major depressive disorder, recurrent, moderate: Secondary | ICD-10-CM

## 2011-05-28 DIAGNOSIS — J309 Allergic rhinitis, unspecified: Secondary | ICD-10-CM

## 2011-05-28 NOTE — Progress Notes (Signed)
   THERAPIST PROGRESS NOTE  Session Time: 12.55pm-1:50pm  Participation Level: Active  Behavioral Response: Well GroomedAlertEuthymic  Type of Therapy: Individual Therapy  Treatment Goals addressed: Diagnosis: 296.32 and goal 2.  Interventions: CBT and Strength-based  Summary: Michele [name] is a 60 y.o. female who presents with depressive hx and multiple medical issues.  Pt reported busy day w/ multiple appts and missed her exercise class as a result today.  Pt reported has made to most exercise classes.  Pt reported feeling good about exercise class, more comfortable, confident and connecting w/ others.  Pt recognized limitations w/ pain and medically issues and how she wants to keep up her house.  Pt good insight   Suicidal/Homicidal: Nowithout intent/plan  Therapist Response: assessed pt current functioning per her report. Processed w/ pt activity level and social interaction- f/u w/ goals for exercise and sleep hygiene.  Reflected positives and assisted in keeping pt aware of cognitive distortions and positive reframes.  Plan: Return again in 3 weeks.  Diagnosis: Axis I: Major Depression, Rec    Axis II: No diagnosis    Ryver Zadrozny, LPC 05/28/2011

## 2011-06-02 ENCOUNTER — Ambulatory Visit (INDEPENDENT_AMBULATORY_CARE_PROVIDER_SITE_OTHER): Payer: No Typology Code available for payment source

## 2011-06-02 DIAGNOSIS — J309 Allergic rhinitis, unspecified: Secondary | ICD-10-CM

## 2011-06-07 ENCOUNTER — Ambulatory Visit: Payer: Medicare Other | Admitting: Internal Medicine

## 2011-06-16 ENCOUNTER — Ambulatory Visit (INDEPENDENT_AMBULATORY_CARE_PROVIDER_SITE_OTHER): Payer: No Typology Code available for payment source

## 2011-06-16 DIAGNOSIS — J309 Allergic rhinitis, unspecified: Secondary | ICD-10-CM

## 2011-06-17 ENCOUNTER — Ambulatory Visit (INDEPENDENT_AMBULATORY_CARE_PROVIDER_SITE_OTHER): Payer: No Typology Code available for payment source | Admitting: Psychology

## 2011-06-17 DIAGNOSIS — F32A Depression, unspecified: Secondary | ICD-10-CM

## 2011-06-17 DIAGNOSIS — F329 Major depressive disorder, single episode, unspecified: Secondary | ICD-10-CM

## 2011-06-17 NOTE — Progress Notes (Signed)
   THERAPIST PROGRESS NOTE  Session Time: 11.35am-12:20pm  Participation Level: Active  Behavioral Response: Well GroomedAlertDepressed  Type of Therapy: Individual Therapy  Treatment Goals addressed: Coping and Diagnosis: MDD  Interventions: Strength-based, Supportive and Other: Grief Support  Summary: Michele [name] is a 60 y.o. female who presents with affect congruent w/ report of grieving the loss of her 46y/o brother who died on 06/25/11.  Pt reported attending w/ all her siblings the viewing and burial in [place] and returning on Thanksgiving day last week.  Pt expressed that she is struggling w/ the reality that he is no longer here and the unexpectedness of his death.  Pt was able to have insight into this as process of her bereavement.  Pt discussed the support of each other as siblings and all made a commitment to getting together more often as they are living in 3 different regions of the [place].  Pt was able to report on continuing to take care of self daily and plans to engage in community activities over the weekend.   Suicidal/Homicidal: Nowithout intent/plan  Therapist Response: Assessed pt current functioning per her report.  Provided listening, reflecting and opportunity to express feelings re: brother's death and her bereavement.  Validated and normalized feelings expressing and discussed strengths of her and support system for coping this loss.  Plan: Return again in 2 weeks.  Diagnosis: Axis I: Bereavement and Major Depression, Recurrent     Axis II: No diagnosis    Dailon Sheeran, LPC 06/17/2011

## 2011-06-29 ENCOUNTER — Ambulatory Visit (INDEPENDENT_AMBULATORY_CARE_PROVIDER_SITE_OTHER): Payer: No Typology Code available for payment source | Admitting: Psychology

## 2011-06-29 DIAGNOSIS — F331 Major depressive disorder, recurrent, moderate: Secondary | ICD-10-CM

## 2011-06-29 NOTE — Progress Notes (Signed)
   THERAPIST PROGRESS NOTE  Session Time: 10.25am-11.15am  Participation Level: Active  Behavioral Response: Fairly GroomedAlertDepressed  Type of Therapy: Individual Therapy  Treatment Goals addressed: Diagnosis: MDD and goal 2.   Interventions: CBT, Strength-based and Other: grief counseling  Summary: Michele [name] is a 60 y.o. female who presents with blunted affect w/ depressed mood reported.  Pt reported that she hasn't made to her exercised class after twisting ankle a week ago and w/ pain increased in her back.  Pt reported depressed mood and was able to increase awareness of related to grief.  Pt discussed interactions and relationship w/ granddaughter and was able to acknowledged and reframe negative self talk in session.  Pt receptive to idea of not avoiding talk of brother even though feels sad to talk about as part of grieving process.   Suicidal/Homicidal: Nowithout intent/plan  Therapist Response: Assessed pt current functioning per her report.  Discussed contributing factor of grief to reported increased depressed mood.  Encouraged pt to not avoid talking of brother who recently died.  Counselor encouraged to speak w/ her orthopedist who tx her back pain if pain management no longer effective.  Reflected negative self talk in session and encouraged pt to make positive reframes.  Acknowledged positive effects of active relationship w/ grandchildren.  Plan: Return again in 2-3 weeks.  Diagnosis: Axis I: MDD    Axis II: No diagnosis    Huntley Knoop, LPC 06/29/2011

## 2011-06-30 ENCOUNTER — Ambulatory Visit (INDEPENDENT_AMBULATORY_CARE_PROVIDER_SITE_OTHER): Payer: No Typology Code available for payment source | Admitting: Psychiatry

## 2011-06-30 ENCOUNTER — Ambulatory Visit (INDEPENDENT_AMBULATORY_CARE_PROVIDER_SITE_OTHER): Payer: No Typology Code available for payment source

## 2011-06-30 ENCOUNTER — Encounter (HOSPITAL_COMMUNITY): Payer: Self-pay | Admitting: Psychiatry

## 2011-06-30 VITALS — BP 134/83 | HR 74 | Ht 62.0 in | Wt 306.0 lb

## 2011-06-30 DIAGNOSIS — F331 Major depressive disorder, recurrent, moderate: Secondary | ICD-10-CM

## 2011-06-30 DIAGNOSIS — J309 Allergic rhinitis, unspecified: Secondary | ICD-10-CM

## 2011-06-30 MED ORDER — ARIPIPRAZOLE 15 MG PO TABS: 15.0000 mg | ORAL_TABLET | Freq: Every day | ORAL | Status: DC

## 2011-06-30 MED ORDER — BENZTROPINE MESYLATE 0.5 MG PO TABS: 0.5000 mg | ORAL_TABLET | Freq: Every day | ORAL | Status: DC

## 2011-06-30 MED ORDER — LORAZEPAM 0.5 MG PO TABS: 0.5000 mg | ORAL_TABLET | Freq: Every day | ORAL | Status: DC

## 2011-06-30 MED ORDER — SERTRALINE HCL 100 MG PO TABS: 100.0000 mg | ORAL_TABLET | Freq: Every day | ORAL | Status: DC

## 2011-06-30 NOTE — Progress Notes (Signed)
[facility] [address] Progress Note  Michele [name] [id number] 60 y.o.  06/30/2011 10:14 AM  Chief Complaint:  I'm here to get my medication. I was very sad and depressed when one of my relative died one month ago due to prolonged illness  History of Present Illness: Patient came for her followup appointment. She has been under stress and depressed detailed one of her close relative died due to medical illness. She was isolated withdrawn and tearful however she is feeling better now. She is compliant with her medication and reported no side effects. She's been seeing therapist regularly. She denies any agitation anger or mood swings. There no extrapyramidal side effects. She does not want to change her medication. Suicidal Ideation: No Plan Formed: No Patient has means to carry out plan: No  Homicidal Ideation: No Plan Formed: No Patient has means to carry out plan: No  Review of Systems: Psychiatric: Agitation: No Hallucination: No Depressed Mood: Yes Insomnia: Yes Hypersomnia: No Altered Concentration: No Feels Worthless: No Grandiose Ideas: No Belief In Special Powers: No New/Increased Substance Abuse: No Compulsions: No  Neurologic: Headache: Yes Seizure: No Paresthesias: No  Past Medical Family, Social History: Patient has significant history of hypertension sleep apnea coronary artery disease allergic tonight is asthma reflux obesity diabetes mellitus and vitamin D deficiency  Outpatient Encounter Prescriptions as of 06/30/2011  Medication Sig Dispense Refill  . aspirin 81 MG tablet Take 81 mg by mouth daily.        . benztropine (COGENTIN) 0.5 MG tablet Take 1 tablet (0.5 mg total) by mouth at bedtime.  30 tablet  2  . sertraline (ZOLOFT) 100 MG tablet Take 1 tablet (100 mg total) by mouth daily. Take 2 by mouth once daily  60 tablet  2  . DISCONTD: benztropine (COGENTIN) 0.5 MG tablet Take 1 by mouth once daily        . DISCONTD: sertraline (ZOLOFT)  100 MG tablet Take 2 by mouth once daily      . albuterol (PROVENTIL HFA) 108 (90 BASE) MCG/ACT inhaler Inhale 2 puffs into the lungs every 6 (six) hours as needed.  1 Inhaler  3  . albuterol (PROVENTIL) (2.5 MG/3ML) 0.083% nebulizer solution Take 3 mLs (2.5 mg total) by nebulization every 6 (six) hours as needed.  360 mL  2  . ARIPiprazole (ABILIFY) 15 MG tablet Take 1 tablet (15 mg total) by mouth daily.  30 tablet  2  . diltiazem (CARDIZEM CD) 240 MG 24 hr capsule Take 240 mg by mouth daily.        . fexofenadine (ALLEGRA) 60 MG tablet Take 1 tablet (60 mg total) by mouth daily. Take 60 mg by mouth daily.  30 tablet  11  . Fluticasone-Salmeterol (ADVAIR DISKUS) 250-50 MCG/DOSE AEPB Inhale 1 puff into the lungs every 12 (twelve) hours.        . furosemide (LASIX) 40 MG tablet       . HYDROcodone-acetaminophen (NORCO) 5-325 MG per tablet Take 1 tablet by mouth every 6 (six) hours as needed.        . isosorbide dinitrate (ISORDIL) 30 MG tablet Take 1 by mouth daily       . isosorbide mononitrate (IMDUR) 30 MG 24 hr tablet       . LORazepam (ATIVAN) 0.5 MG tablet Take 1 tablet (0.5 mg total) by mouth daily.  30 tablet  1  . metaxalone (SKELAXIN) 800 MG tablet Take 800 mg by mouth 3 (three) times daily.        Marland Kitchen  metFORMIN (GLUMETZA) 500 MG (MOD) 24 hr tablet Take 500 mg by mouth daily with breakfast.        . metoprolol (LOPRESSOR) 100 MG tablet       . metoprolol (TOPROL-XL) 100 MG 24 hr tablet Take 100 mg by mouth daily.        . mometasone (NASONEX) 50 MCG/ACT nasal spray 2 sprays by Nasal route daily.        Marland Kitchen morphine (MSIR) 15 MG tablet Take 1 by mouth at bedtime        . nitroGLYCERIN (NITROSTAT) 0.4 MG SL tablet Place 0.4 mg under the tongue every 5 (five) minutes as needed.        . potassium chloride (KLOR-CON) 20 MEQ packet Take 1 by mouth daily       . rosuvastatin (CRESTOR) 10 MG tablet Take 10 mg by mouth daily.        Marland Kitchen SANCTURA XR 60 MG CP24 Take 1 capsule by mouth daily.      .  valsartan-hydrochlorothiazide (DIOVAN-HCT) 160-12.5 MG per tablet Take 1 tablet by mouth daily.        Marland Kitchen warfarin (COUMADIN) 7.5 MG tablet Take  1.5 by mouth once daily or as directed       . DISCONTD: ARIPiprazole (ABILIFY) 15 MG tablet Take 15 mg by mouth daily.        Marland Kitchen DISCONTD: LORazepam (ATIVAN) 0.5 MG tablet Take 0.5 mg by mouth daily.         Past Psychiatric History/Hospitalization(s): Anxiety: Yes Bipolar Disorder: No Depression: Yes Mania: No Psychosis: Yes Schizophrenia: No Personality Disorder: No Hospitalization for psychiatric illness: Yes History of Electroconvulsive Shock Therapy: No Prior Suicide Attempts: No  Physical Exam: Constitutional:  BP 134/83  Pulse 74  Ht 5\' 2"  (1.575 m)  Wt 306 lb (138.801 kg)  BMI 55.97 kg/m2  General Appearance: alert, oriented, no acute distress and well nourished  Musculoskeletal: Strength & Muscle Tone: within normal limits Gait & Station: normal Patient leans: N/A  Psychiatric: Speech (describe rate, volume, coherence, spontaneity, and abnormalities if any): Her speech is slow but clear and coherent. She has normal volume and rate  Thought Process (describe rate, content, abstract reasoning, and computation): She has poverty of thought content with occasional hallucination however she denies any active or passive suicidal thoughts or homicidal thoughts.  Associations: Coherent and Relevant  Thoughts: normal  Mental Status: Orientation: oriented to person, place, time/date and situation Mood & Affect: depressed affect and anxiety Attention Span & Concentration: Fair  Medical Decision Making (Choose Three): Review of Psycho-Social Stressors (1), Review of Last Therapy Session (1), Review or order medicine tests (1) and Review of Medication Regimen & Side Effects (2)  Assessment: Axis I: Maj. depressive disorder with psychotic features, posttraumatic stress disorder  Axis II: Deferred  Axis III: See medical  history  Axis IV: Mild to moderate  Axis V: 55-60   Plan: I will continue her current medication. She is taking Zoloft 200 mg Abilify 15 mg and Cogentin 0.5 mg. She's also taking lorazepam 0.5 mg at bedtime for insomnia. Currently patient exhibiting no side effects of medication. She will continue to see therapist regularly. I reinforced to take the medication regularly. We also talked about safety plan that if she start to feel worsening of her illness or any time having suicidal thoughts and homicidal thoughts then she need to call 911 or go to local ER. I will see her again in 3 months  Bonny Vanleeuwen  T., MD 06/30/2011

## 2011-07-23 ENCOUNTER — Ambulatory Visit (INDEPENDENT_AMBULATORY_CARE_PROVIDER_SITE_OTHER): Payer: No Typology Code available for payment source

## 2011-07-23 DIAGNOSIS — J309 Allergic rhinitis, unspecified: Secondary | ICD-10-CM

## 2011-07-27 ENCOUNTER — Ambulatory Visit (INDEPENDENT_AMBULATORY_CARE_PROVIDER_SITE_OTHER): Payer: Medicare Other | Admitting: Psychology

## 2011-07-27 DIAGNOSIS — F331 Major depressive disorder, recurrent, moderate: Secondary | ICD-10-CM

## 2011-07-27 NOTE — Progress Notes (Addendum)
   THERAPIST PROGRESS NOTE  Session Time: 10.30am-11.25am  Participation Level: Active  Behavioral Response: Fairly GroomedAlertDepressed  Type of Therapy: Individual Therapy  Treatment Goals addressed: Diagnosis: MDD and goal 2.   Interventions: CBT, Strength-based and Other: grief counseling  Summary: Michele [name] is a 61 y.o. female who presents with depressed mood and affect.  Pt reported still not feeling "like herself" and insight that related to grief over brother.  Pt discussed her beliefs about life after death and how this helps to put perspective for her.  Pt reported still daily interactions w/ her sisters and friend over the phone.  Pt discussed feeling overwhelmed at times w/ having to keep up and remember about various medical appointments and tests- pat agreed to f/u w/ PCP re: schedule for preventive testing she was inquiring about.   Suicidal/Homicidal: Nowithout intent/plan  Therapist Response: Assessed pt current functioning per her report.  Processed w/pt her grief exploring her beliefs and how that impacts.  Encouraged continued communicating about relationship w/ brother w/ other loved ones.  Encouraged pt to also remember positives of interactions had w/ brother and continuing to create opportunities for positive interactions w/ loved ones in the present.   Plan: Return again in 2-3 weeks.  Diagnosis: Axis I: MDD    Axis II: No diagnosis    Forde Radon, [facility] 07/27/2011  [facility] Outpatient Therapist Documentation Restriction  Forde Radon, [facility] 08/31/2011

## 2011-07-30 ENCOUNTER — Ambulatory Visit (INDEPENDENT_AMBULATORY_CARE_PROVIDER_SITE_OTHER): Payer: No Typology Code available for payment source

## 2011-07-30 DIAGNOSIS — J309 Allergic rhinitis, unspecified: Secondary | ICD-10-CM

## 2011-08-11 NOTE — Progress Notes (Signed)
Addended by: Mckensi Redinger M on: 08/11/2011 10:05 AM   Modules accepted: Level of Service  

## 2011-08-12 ENCOUNTER — Ambulatory Visit (HOSPITAL_COMMUNITY): Payer: No Typology Code available for payment source | Admitting: Psychology

## 2011-08-19 ENCOUNTER — Ambulatory Visit (INDEPENDENT_AMBULATORY_CARE_PROVIDER_SITE_OTHER): Payer: Medicare Other | Admitting: Psychology

## 2011-08-19 DIAGNOSIS — F331 Major depressive disorder, recurrent, moderate: Secondary | ICD-10-CM

## 2011-08-19 NOTE — Progress Notes (Signed)
   THERAPIST PROGRESS NOTE  Session Time: 11.25am-12.20pm  Participation Level: Active  Behavioral Response: NeatAlertDepressed  Type of Therapy: Individual Therapy  Treatment Goals addressed: Diagnosis: MDD and goal 1.  Interventions: CBT and Supportive  Summary: Michele [name] is a 61 y.o. female who presents with depressed mood and affect.  Pt reports dealing w/ a lot of pain and reported in recent visit to Dr. 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   Pt expressed feeling like she is 'complaining' if endorses problems at doctors and was able to increase awareness of cognitive distoritons play.  Pt awareness of need to inform doctors of her problems for self care and agreed to inform PCP of memory problems she endorsed to therapist.  Pt also expressed worry for son w/ financial struggles/no work but recognizing that she can't support financially and that has to stay consistent w/ son w/ no when he does ask and to not have guilt about.  Suicidal/Homicidal: Nowithout intent/plan  Therapist Response: Assessed pt current funcitoning per her report.  Assisted pt in identifying cognitive distortions and reframing and encouraging her to advocate for her needs w/ doctors by informing of problems.  Reitrated to pt positive ways supports he son w/ boundaries.  Plan: Return again in 2 weeks.  Diagnosis: Axis I: MDD    Axis II: No diagnosis    [name],[name], LPC 08/19/2011

## 2011-08-20 ENCOUNTER — Ambulatory Visit (INDEPENDENT_AMBULATORY_CARE_PROVIDER_SITE_OTHER): Payer: Medicare Other

## 2011-08-20 DIAGNOSIS — J309 Allergic rhinitis, unspecified: Secondary | ICD-10-CM

## 2011-08-21 ENCOUNTER — Telehealth: Payer: Self-pay | Admitting: Hematology and Oncology

## 2011-08-21 NOTE — Telephone Encounter (Signed)
lmonvm adviisng the pt of her June 2013 appts

## 2011-08-27 ENCOUNTER — Ambulatory Visit (INDEPENDENT_AMBULATORY_CARE_PROVIDER_SITE_OTHER): Payer: Medicare Other

## 2011-08-27 DIAGNOSIS — J309 Allergic rhinitis, unspecified: Secondary | ICD-10-CM

## 2011-08-31 ENCOUNTER — Ambulatory Visit
Payer: Medicare Other | Attending: Physical Medicine and Rehabilitation | Admitting: Rehabilitative and Restorative Service Providers"

## 2011-08-31 DIAGNOSIS — M542 Cervicalgia: Secondary | ICD-10-CM | POA: Insufficient documentation

## 2011-08-31 DIAGNOSIS — R293 Abnormal posture: Secondary | ICD-10-CM | POA: Insufficient documentation

## 2011-08-31 DIAGNOSIS — IMO0001 Reserved for inherently not codable concepts without codable children: Secondary | ICD-10-CM | POA: Insufficient documentation

## 2011-09-01 ENCOUNTER — Ambulatory Visit: Payer: Medicare Other | Admitting: Rehabilitative and Restorative Service Providers"

## 2011-09-02 ENCOUNTER — Ambulatory Visit (INDEPENDENT_AMBULATORY_CARE_PROVIDER_SITE_OTHER): Payer: Medicare Other | Admitting: Psychology

## 2011-09-02 DIAGNOSIS — F4310 Post-traumatic stress disorder, unspecified: Secondary | ICD-10-CM

## 2011-09-02 DIAGNOSIS — F331 Major depressive disorder, recurrent, moderate: Secondary | ICD-10-CM

## 2011-09-02 NOTE — Progress Notes (Signed)
   THERAPIST PROGRESS NOTE  Session Time: 11:30am-12:15pm  Participation Level: Active  Behavioral Response: Fairly GroomedAlertDepressed  Type of Therapy: Individual Therapy  Treatment Goals addressed: Diagnosis: MDD and goal 1&2.  Interventions: CBT and Reframing  Summary: Michele [name] is a 61 y.o. female who presents with slightly depressed mood and affect.  Pt reports today is difficult for her as anniversary of father's death.  Pt was able to focus on positive interactions w/ other loved ones and need to focus on celebration of father's life.  Pt reported feeling discouraged w/ her memory and recognizes negative self talk about when can't remember to do something or recall details she wants.  Pt discussed things that are healthy cognitive exercises to do and to f/u w/ doctors about.  Pt agreed to focus on positive self talk and reframe as aging process with valuing herself..   Suicidal/Homicidal: Nowithout intent/plan  Therapist Response: Assessed pt current functioning per her report.  Processed w/pt struggle of today and how to reframe w/ loss of father on this day to celebration of his life on today.  Reflected pt negative self talk and messages given and challenged her for self acceptance and positive self worth statements.  Plan: Return again in 2 weeks.  Diagnosis: Axis I: MDD and PTSD    Axis II: No diagnosis    Vasco Chong, LPC 09/02/2011

## 2011-09-03 ENCOUNTER — Ambulatory Visit (INDEPENDENT_AMBULATORY_CARE_PROVIDER_SITE_OTHER): Payer: Medicare Other

## 2011-09-03 DIAGNOSIS — J309 Allergic rhinitis, unspecified: Secondary | ICD-10-CM

## 2011-09-07 ENCOUNTER — Ambulatory Visit: Payer: Medicare Other | Admitting: Physical Therapy

## 2011-09-09 ENCOUNTER — Ambulatory Visit: Payer: Medicare Other | Admitting: Physical Therapy

## 2011-09-14 ENCOUNTER — Ambulatory Visit: Payer: Medicare Other | Admitting: Rehabilitative and Restorative Service Providers"

## 2011-09-14 ENCOUNTER — Ambulatory Visit (INDEPENDENT_AMBULATORY_CARE_PROVIDER_SITE_OTHER): Payer: Self-pay | Admitting: Psychology

## 2011-09-14 DIAGNOSIS — F331 Major depressive disorder, recurrent, moderate: Secondary | ICD-10-CM

## 2011-09-14 NOTE — Progress Notes (Signed)
   THERAPIST PROGRESS NOTE  Session Time: 1.50pm-2:40pm  Participation Level: Active  Behavioral Response: Well GroomedAlertDepressed  Type of Therapy: Individual Therapy  Treatment Goals addressed: Diagnosis: MDD and goal 1.  Interventions: CBT, Strength-based and Other: Acceptance and Positive Self talk.  Summary: Michele [name] is a 61 y.o. female who presents with sullen affect.  She reported not feeling as down and "trying" to remain positive w/ statements of "trying to do her best".  Pt discussed stressors of medical issues facing and discouraged w/ wanting the ability to do more.  Pt did report some benefit from the physical therapy she is in and how to use some of the heat therapy herself for relaxation at home.  Pt acknowledged and reframe for self acceptance of aging process and using statements of "i am doing my best" not trying and not comparing self to past as at different stage in life.  Pt stated " I really enjoyed this session" at the end.   Suicidal/Homicidal: Nowithout intent/plan  Therapist Response: Assessed pt current functioning per her report.  Processed w/pt stressors, validated and assisted pt in reframing for acceptance of developmental stage in life.  Focused pt on positive self statements of I am and removing the 'trying' tone which she is using in self talk.  Plan: Return again in 2 weeks.  Diagnosis: Axis I: MDD    Axis II: No diagnosis    Shun Pletz, LPC 09/14/2011

## 2011-09-16 ENCOUNTER — Ambulatory Visit (INDEPENDENT_AMBULATORY_CARE_PROVIDER_SITE_OTHER): Payer: Medicare Other

## 2011-09-16 ENCOUNTER — Ambulatory Visit: Payer: Medicare Other | Admitting: Physical Therapy

## 2011-09-16 DIAGNOSIS — J309 Allergic rhinitis, unspecified: Secondary | ICD-10-CM

## 2011-09-21 ENCOUNTER — Ambulatory Visit: Payer: Medicare Other | Attending: Physical Medicine and Rehabilitation | Admitting: Physical Therapy

## 2011-09-21 ENCOUNTER — Encounter: Payer: Self-pay | Admitting: Emergency Medicine

## 2011-09-21 ENCOUNTER — Ambulatory Visit (INDEPENDENT_AMBULATORY_CARE_PROVIDER_SITE_OTHER): Payer: Medicare Other | Admitting: Emergency Medicine

## 2011-09-21 DIAGNOSIS — F339 Major depressive disorder, recurrent, unspecified: Secondary | ICD-10-CM

## 2011-09-21 DIAGNOSIS — R293 Abnormal posture: Secondary | ICD-10-CM | POA: Insufficient documentation

## 2011-09-21 DIAGNOSIS — F32A Depression, unspecified: Secondary | ICD-10-CM

## 2011-09-21 DIAGNOSIS — M542 Cervicalgia: Secondary | ICD-10-CM | POA: Insufficient documentation

## 2011-09-21 DIAGNOSIS — F329 Major depressive disorder, single episode, unspecified: Secondary | ICD-10-CM

## 2011-09-21 DIAGNOSIS — IMO0001 Reserved for inherently not codable concepts without codable children: Secondary | ICD-10-CM | POA: Insufficient documentation

## 2011-09-21 DIAGNOSIS — E119 Type 2 diabetes mellitus without complications: Secondary | ICD-10-CM

## 2011-09-21 DIAGNOSIS — E669 Obesity, unspecified: Secondary | ICD-10-CM

## 2011-09-21 DIAGNOSIS — A539 Syphilis, unspecified: Secondary | ICD-10-CM

## 2011-09-21 LAB — GLUCOSE, POCT (MANUAL RESULT ENTRY): POC Glucose: 101

## 2011-09-21 LAB — POCT GLYCOSYLATED HEMOGLOBIN (HGB A1C): Hemoglobin A1C: 6.2

## 2011-09-21 NOTE — Progress Notes (Signed)
  Subjective:    Patient ID: Michele [name], female    DOB: 07-14-1951, 61 y.o.   MRN: [id number]  HPI patient her sugar recheck of her diabetes. She is overall doing well. She sees her psychiatrist regular and is going to counseling on a regular basis. She is in a physical therapy program and tone rehabilitation under the supervision of Dr. Nelva Bush. She is a very difficult time losing weight.   Review of Systems she is seeing the cardiologist regular. She has no complaints of chest pain or shortness of breath. She has no complaints of numbness or tingling of her feet.     Objective:   Physical Exam  Constitutional: She appears well-developed.  Eyes: Pupils are equal, round, and reactive to light.  Neck: Neck supple. No JVD present. No tracheal deviation present. No thyromegaly present.  Cardiovascular: Normal rate.  Exam reveals friction rub. Exam reveals no gallop.   No murmur heard. Pulmonary/Chest: No respiratory distress. She has no wheezes. She has no rales. She exhibits no tenderness.  Lymphadenopathy:    She has no cervical adenopathy.   extremity exam reveals normal sensation pulses are 2+ and symmetrical.        Assessment & Plan:  Patient is pretty much on track. She needs to focus on trying to lose some weight. She will continue her rehabilitation under the care of Dr. Nelva Bush. We'll check a hemoglobin A1c and sugar today .

## 2011-09-21 NOTE — Assessment & Plan Note (Deleted)
The patient had RPR done here April 2012 which was negative. Recently seen by GYN that she tested positive and received 3 injections of Bicillin.

## 2011-09-22 ENCOUNTER — Ambulatory Visit (HOSPITAL_COMMUNITY): Payer: No Typology Code available for payment source | Admitting: Psychiatry

## 2011-09-23 ENCOUNTER — Ambulatory Visit: Payer: Medicare Other | Admitting: Physical Therapy

## 2011-09-28 ENCOUNTER — Ambulatory Visit: Payer: Medicare Other | Admitting: Rehabilitative and Restorative Service Providers"

## 2011-09-29 ENCOUNTER — Encounter (HOSPITAL_COMMUNITY): Payer: Self-pay | Admitting: Psychiatry

## 2011-09-29 ENCOUNTER — Ambulatory Visit (INDEPENDENT_AMBULATORY_CARE_PROVIDER_SITE_OTHER): Payer: Medicare Other | Admitting: Psychiatry

## 2011-09-29 VITALS — BP 141/96 | HR 66 | Wt 302.6 lb

## 2011-09-29 DIAGNOSIS — F331 Major depressive disorder, recurrent, moderate: Secondary | ICD-10-CM

## 2011-09-29 MED ORDER — SERTRALINE HCL 100 MG PO TABS: 100.0000 mg | ORAL_TABLET | Freq: Every day | ORAL | Status: DC

## 2011-09-29 MED ORDER — LORAZEPAM 0.5 MG PO TABS: 0.5000 mg | ORAL_TABLET | Freq: Every day | ORAL | Status: DC

## 2011-09-29 MED ORDER — ARIPIPRAZOLE 15 MG PO TABS: 15.0000 mg | ORAL_TABLET | Freq: Every day | ORAL | Status: DC
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
## 2011-09-30 ENCOUNTER — Encounter: Payer: Self-pay | Admitting: Rehabilitative and Restorative Service Providers"

## 2011-10-05 ENCOUNTER — Ambulatory Visit: Payer: Medicare Other | Admitting: Physical Therapy

## 2011-10-05 ENCOUNTER — Encounter: Payer: Self-pay | Admitting: Internal Medicine

## 2011-10-07 ENCOUNTER — Ambulatory Visit: Payer: Medicare Other | Admitting: Physical Therapy

## 2011-10-12 ENCOUNTER — Other Ambulatory Visit: Payer: Self-pay | Admitting: *Deleted

## 2011-10-12 ENCOUNTER — Ambulatory Visit (INDEPENDENT_AMBULATORY_CARE_PROVIDER_SITE_OTHER): Payer: Medicare Other | Admitting: Psychology

## 2011-10-12 ENCOUNTER — Ambulatory Visit (INDEPENDENT_AMBULATORY_CARE_PROVIDER_SITE_OTHER): Payer: Medicare Other

## 2011-10-12 ENCOUNTER — Ambulatory Visit: Payer: Medicare Other | Admitting: Rehabilitative and Restorative Service Providers"

## 2011-10-12 DIAGNOSIS — F4310 Post-traumatic stress disorder, unspecified: Secondary | ICD-10-CM

## 2011-10-12 DIAGNOSIS — J309 Allergic rhinitis, unspecified: Secondary | ICD-10-CM

## 2011-10-12 DIAGNOSIS — F331 Major depressive disorder, recurrent, moderate: Secondary | ICD-10-CM

## 2011-10-12 MED ORDER — ROSUVASTATIN CALCIUM 10 MG PO TABS: 10.0000 mg | ORAL_TABLET | ORAL | Status: DC

## 2011-10-12 NOTE — Progress Notes (Signed)
   THERAPIST PROGRESS NOTE  Session Time: 1.45pm-2;35pm  Participation Level: Active  Behavioral Response: CasualAlert, Mood WNL  Type of Therapy: Individual Therapy  Treatment Goals addressed: Diagnosis: MDD, PTSD and goal 1  Interventions: CBT, Supportive and Other: Connecting w/ Supports  Summary: Michele [name] is a 61 y.o. female who presents with report of feeling tired today.  Pt is engaged in session, but less talkative than her norm.  Pt reported w/ physcial therapy this morning she was up very early and feels tired.  Pt reported that she is also dealing w/ increased lower back pain, but therapy has helped w/ upperback pain.  She reported that she is focusing on positive self talk, use of keeping in the present and engaged in activities to keep from dwelling on past.  Pt reports still feeling a longing for "home".  Pt reported on some connections making in the community and agrees need to keep working towards building these connections.   Suicidal/Homicidal: Nowithout intent/plan  Therapist Response: Assessed pt current functioning per her report.  processed w/pt stressors w/ pain and feeling of loneliness.  Reflected use of positive thinking and self talk.  Discussed ways to build relationships through regular involvement in community group- where gives a focus to interactions and time for pt to feel increased comfort w/ repeated interactions.  Plan: Return again in 2 weeks.  Diagnosis: Axis I: Major Depression, Recurrent  and Post Traumatic Stress Disorder    Axis II: No diagnosis    Fabian Coca, LPC 10/12/2011

## 2011-10-14 ENCOUNTER — Encounter: Payer: Self-pay | Admitting: Rehabilitative and Restorative Service Providers"

## 2011-10-19 ENCOUNTER — Ambulatory Visit: Payer: Medicare Other | Admitting: Rehabilitative and Restorative Service Providers"

## 2011-10-21 ENCOUNTER — Encounter: Payer: Self-pay | Admitting: Rehabilitative and Restorative Service Providers"

## 2011-10-22 ENCOUNTER — Ambulatory Visit (INDEPENDENT_AMBULATORY_CARE_PROVIDER_SITE_OTHER): Payer: Medicare Other

## 2011-10-22 DIAGNOSIS — J309 Allergic rhinitis, unspecified: Secondary | ICD-10-CM

## 2011-10-26 ENCOUNTER — Ambulatory Visit (INDEPENDENT_AMBULATORY_CARE_PROVIDER_SITE_OTHER): Payer: Medicare Other | Admitting: Psychology

## 2011-10-26 DIAGNOSIS — F331 Major depressive disorder, recurrent, moderate: Secondary | ICD-10-CM

## 2011-10-26 DIAGNOSIS — F4310 Post-traumatic stress disorder, unspecified: Secondary | ICD-10-CM

## 2011-10-26 NOTE — Progress Notes (Signed)
   THERAPIST PROGRESS NOTE  Session Time: 2pm-2:45pm  Participation Level: Active  Behavioral Response: Well GroomedAlertDepressed  Type of Therapy: Individual Therapy  Treatment Goals addressed: Diagnosis: MDD and goal 1.  Interventions: CBT, Strength-based and Reframing  Summary: ELAIN WIXON is a 61 y.o. female who presents with depressed mood and affect.  Pt reports that struggling w/ depressed mood over the past week and acknowledges contributing factors of anniversary of mother's death, holiday w/out loved ones and other stressors- medical and financial.  Pt was able to make reframes and focus on strengths she has for coping- supports. Pt agrees to complete financial assistance forms and look into assistance through social services.  Suicidal/Homicidal: Nowithout intent/plan  Therapist Response: Assessed pt current functioning per pt report.  Explored w/pt contributing factors to depressed moods.  Processed w/pt thinking- coping and strengths.  Encouraged pt to look into financial assistance through [facility] and through DSS.  Plan: Return again in 2 weeks.  Diagnosis: Axis I: Major Depression, Recurrent and Post Traumatic Stress Disorder    Axis II: No diagnosis    Doraine Schexnider, LPC 10/26/2011

## 2011-11-09 ENCOUNTER — Ambulatory Visit (INDEPENDENT_AMBULATORY_CARE_PROVIDER_SITE_OTHER): Payer: Medicare Other | Admitting: Psychology

## 2011-11-09 ENCOUNTER — Ambulatory Visit (INDEPENDENT_AMBULATORY_CARE_PROVIDER_SITE_OTHER): Payer: Medicare Other

## 2011-11-09 DIAGNOSIS — J309 Allergic rhinitis, unspecified: Secondary | ICD-10-CM

## 2011-11-09 DIAGNOSIS — F4310 Post-traumatic stress disorder, unspecified: Secondary | ICD-10-CM

## 2011-11-09 DIAGNOSIS — F331 Major depressive disorder, recurrent, moderate: Secondary | ICD-10-CM

## 2011-11-09 NOTE — Progress Notes (Signed)
   THERAPIST PROGRESS NOTE  Session Time: 1.00-2.00pm  Participation Level: Active  Behavioral Response: CasualAlertDepressed  Type of Therapy: Individual Therapy  Treatment Goals addressed: Diagnosis: MDD, PTSD, goal 1.  Interventions: CBT, Strength-based and Reframing  Summary: Michele [name] is a 61 y.o. female who presents with depressed mood and affect.  Pt reports that she has struggled w/ feeling "low", but unable to identify a stressor.  Pt was able to gain further insight into contributing factors of losses- both loved ones and limitations for self w/ physical pain/disability.  Pt also discussed impact of March as difficult month of anniversary's of losses and deceased loved one birthdays.  Pt was able to acknowledge that she needs to be more proactive w/ her positive self care to help mood- getting out of the home, continuing to connect w/ supports and community, getting out of the bed more and being realistic w/ her expectations for self.    Suicidal/Homicidal: Nowithout intent/plan  Therapist Response: ASsessed pt current functioning per her report.  Processed w/pt contributing factors for mood and themes of loss, loneliness and grieving lost abilities.  Encouraged pt to identify positive self care that matches her strengths to assist through coping w/ sad mood.  Plan: Return again in 2 weeks.  Diagnosis: Axis I: Major Depression, Recurrent and Post Traumatic Stress Disorder    Axis II: No diagnosis    Adwoa Axe, LPC 11/09/2011

## 2011-11-15 ENCOUNTER — Other Ambulatory Visit: Payer: Self-pay | Admitting: Emergency Medicine

## 2011-11-25 ENCOUNTER — Ambulatory Visit (HOSPITAL_COMMUNITY): Payer: Self-pay | Admitting: Psychology

## 2011-12-01 ENCOUNTER — Ambulatory Visit
Payer: Medicare Other | Attending: Physical Medicine and Rehabilitation | Admitting: Rehabilitative and Restorative Service Providers"

## 2011-12-01 DIAGNOSIS — R293 Abnormal posture: Secondary | ICD-10-CM | POA: Insufficient documentation

## 2011-12-01 DIAGNOSIS — M542 Cervicalgia: Secondary | ICD-10-CM | POA: Insufficient documentation

## 2011-12-01 DIAGNOSIS — IMO0001 Reserved for inherently not codable concepts without codable children: Secondary | ICD-10-CM | POA: Insufficient documentation

## 2011-12-02 ENCOUNTER — Ambulatory Visit (HOSPITAL_COMMUNITY): Payer: Self-pay | Admitting: Psychology

## 2011-12-08 ENCOUNTER — Ambulatory Visit (INDEPENDENT_AMBULATORY_CARE_PROVIDER_SITE_OTHER): Payer: Medicare Other

## 2011-12-08 ENCOUNTER — Ambulatory Visit (INDEPENDENT_AMBULATORY_CARE_PROVIDER_SITE_OTHER): Payer: Medicare Other | Admitting: Psychology

## 2011-12-08 DIAGNOSIS — F331 Major depressive disorder, recurrent, moderate: Secondary | ICD-10-CM

## 2011-12-08 DIAGNOSIS — J309 Allergic rhinitis, unspecified: Secondary | ICD-10-CM

## 2011-12-08 NOTE — Progress Notes (Signed)
   THERAPIST PROGRESS NOTE  Session Time: 10:30am-11:20am  Participation Level: Active  Behavioral Response: Well GroomedAlertEuthymic  Type of Therapy: Individual Therapy  Treatment Goals addressed: Diagnosis: MDD and goal 1&2.  Interventions: CBT and Strength-based  Summary: Michele [name] is a 61 y.o. female who presents with full and bright affect.  Pt reported that she had cancelled her appt last week due to severe pain, however she is hopeful as begins physical therapy tomorrow to address lower back pain and had positive result w/ last that targeted upper back pain.  Pt reported an improvement w/ mood- less sadness and less feelings of loneliness.  She discussed positives w/ interactions w/ supports- morning coffee teleconference calls w/ sisters, sister and brother visiting from out of state last week, visiting w/ sister who lives across the street and visiting w/ friend through the church.  Pt good insight into positive effects of this and good insight into need of support w/ deceased brother's birthday in 2 weeks.  Suicidal/Homicidal: Nowithout intent/plan  Therapist Response: Assessed pt current functioning per her report.  Processed w/pt her improvements reported w/ mood and contributing factors of supports and use of coping skills.   Plan: Return again in 2 weeks.  Diagnosis: Axis I: MDD    Axis II: No diagnosis    Algis Lehenbauer, LPC 12/08/2011

## 2011-12-09 ENCOUNTER — Ambulatory Visit: Payer: Medicare Other | Admitting: Physical Therapy

## 2011-12-10 ENCOUNTER — Telehealth: Payer: Self-pay | Admitting: Hematology and Oncology

## 2011-12-10 NOTE — Telephone Encounter (Signed)
l/m that appt was moved from 6/19 to 6/21 with dr lo    aom

## 2011-12-14 ENCOUNTER — Ambulatory Visit: Payer: Medicare Other | Admitting: Physical Therapy

## 2011-12-16 ENCOUNTER — Ambulatory Visit: Payer: Medicare Other | Admitting: Physical Therapy

## 2011-12-21 ENCOUNTER — Ambulatory Visit: Payer: Medicare Other | Attending: Physical Medicine and Rehabilitation | Admitting: Physical Therapy

## 2011-12-21 ENCOUNTER — Ambulatory Visit (INDEPENDENT_AMBULATORY_CARE_PROVIDER_SITE_OTHER): Payer: Medicare Other | Admitting: Emergency Medicine

## 2011-12-21 VITALS — BP 124/78 | HR 70 | Temp 98.1°F | Resp 18 | Ht 62.5 in | Wt 300.8 lb

## 2011-12-21 DIAGNOSIS — E119 Type 2 diabetes mellitus without complications: Secondary | ICD-10-CM

## 2011-12-21 DIAGNOSIS — I2510 Atherosclerotic heart disease of native coronary artery without angina pectoris: Secondary | ICD-10-CM

## 2011-12-21 DIAGNOSIS — IMO0001 Reserved for inherently not codable concepts without codable children: Secondary | ICD-10-CM

## 2011-12-21 DIAGNOSIS — M542 Cervicalgia: Secondary | ICD-10-CM | POA: Insufficient documentation

## 2011-12-21 DIAGNOSIS — R293 Abnormal posture: Secondary | ICD-10-CM | POA: Insufficient documentation

## 2011-12-21 LAB — POCT GLYCOSYLATED HEMOGLOBIN (HGB A1C): Hemoglobin A1C: 6.3

## 2011-12-21 LAB — GLUCOSE, POCT (MANUAL RESULT ENTRY): POC Glucose: 110 mg/dl — AB (ref 70–99)

## 2011-12-21 NOTE — Progress Notes (Signed)
  Subjective:    Patient ID: Michele [name], female    DOB: [birth year], 61 y.o.   MRN: [id number]  HPI history of followup on her diabetes she overall is doing well except not going to exercise classes.    Review of Systems she is a sore area in the back of her left leg the physical therapist is treating with strapping . She has no chest pain shortness of breath or bowel complaint     Objective:   Physical Exam she is an overweight cooperative female in no distress. Neck is supple. Chest is clear. Cardiac exam is regular rate no murmurs there is a tender area that has been strapped in the back of her left upper lid   Results for orders placed in visit on 12/21/11  GLUCOSE, POCT (MANUAL RESULT ENTRY)      Component Value Range   POC Glucose 110 (*) 70 - 99 (mg/dl)  POCT GLYCOSYLATED HEMOGLOBIN (HGB A1C)      Component Value Range   Hemoglobin A1C 6.3         Assessment & Plan:  Diabetes mellitus is stable at present. No change in medications. I strongly encouraged her to schedule herself for exercise classes at the [facility]

## 2011-12-23 ENCOUNTER — Ambulatory Visit (INDEPENDENT_AMBULATORY_CARE_PROVIDER_SITE_OTHER): Payer: Medicare Other | Admitting: Psychology

## 2011-12-23 ENCOUNTER — Ambulatory Visit (INDEPENDENT_AMBULATORY_CARE_PROVIDER_SITE_OTHER): Payer: Medicare Other

## 2011-12-23 ENCOUNTER — Ambulatory Visit: Payer: Medicare Other | Admitting: Rehabilitative and Restorative Service Providers"

## 2011-12-23 DIAGNOSIS — J309 Allergic rhinitis, unspecified: Secondary | ICD-10-CM

## 2011-12-23 DIAGNOSIS — F331 Major depressive disorder, recurrent, moderate: Secondary | ICD-10-CM

## 2011-12-23 NOTE — Progress Notes (Signed)
   THERAPIST PROGRESS NOTE  Session Time: 9:50am-10:40am  Participation Level: Active  Behavioral Response: Well GroomedAlertEuthymic  Type of Therapy: Individual Therapy  Treatment Goals addressed: Diagnosis: MDD and goal 1&2.  Interventions: CBT and Strength-based  Summary: Michele [name] is a 61 y.o. female who presents with generally full and bright affect.   Pt arrived late as coming from physical therapy and was waiting for security to accompany down handicap entrance.  Pt reported her physical therapy is going well and also saw PCP this week who informed doing well w/ all labs but encourage to loss weight for increased wellness.  Pt reports she is focused on attempting to restart water aerobics and feels encouraged to do so.  Pt discussed how she has focused today being deceased brother's birthday to "count her blessings".  Pt was able to identify several things grateful for and reframe w/ negative thinking. Pt also informs of support from siblings today  Suicidal/Homicidal: Nowithout intent/plan  Therapist Response: Assessed pt current functioning per her report. Processed w/ pt her health and connection w/ overall wellness.  Validated importance of today and how to memorialize brother w/ gratitude and connection w/ mutual loved ones.  Plan: Return again in 2 weeks.  Diagnosis: Axis I: MDD    Axis II: No diagnosis    Chaynce Schafer, LPC 12/23/2011

## 2011-12-28 ENCOUNTER — Ambulatory Visit: Payer: Medicare Other | Admitting: Rehabilitation

## 2011-12-29 ENCOUNTER — Encounter (HOSPITAL_COMMUNITY): Payer: Self-pay | Admitting: Psychiatry

## 2011-12-29 ENCOUNTER — Ambulatory Visit (INDEPENDENT_AMBULATORY_CARE_PROVIDER_SITE_OTHER): Payer: Medicare Other | Admitting: Psychiatry

## 2011-12-29 VITALS — BP 120/86 | HR 96 | Wt 296.6 lb

## 2011-12-29 DIAGNOSIS — F331 Major depressive disorder, recurrent, moderate: Secondary | ICD-10-CM

## 2011-12-29 DIAGNOSIS — F329 Major depressive disorder, single episode, unspecified: Secondary | ICD-10-CM

## 2011-12-29 DIAGNOSIS — F4310 Post-traumatic stress disorder, unspecified: Secondary | ICD-10-CM

## 2011-12-29 MED ORDER — LORAZEPAM 0.5 MG PO TABS: 0.5000 mg | ORAL_TABLET | Freq: Every day | ORAL | Status: DC

## 2011-12-29 MED ORDER — BENZTROPINE MESYLATE 0.5 MG PO TABS: 0.5000 mg | ORAL_TABLET | Freq: Every day | ORAL | Status: DC

## 2011-12-29 MED ORDER — SERTRALINE HCL 100 MG PO TABS: ORAL_TABLET | ORAL | Status: DC

## 2011-12-29 MED ORDER — ARIPIPRAZOLE 15 MG PO TABS: 15.0000 mg | ORAL_TABLET | Freq: Every day | ORAL | Status: DC
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
## 2011-12-30 ENCOUNTER — Ambulatory Visit: Payer: Medicare Other | Admitting: Rehabilitative and Restorative Service Providers"

## 2011-12-30 ENCOUNTER — Encounter: Payer: Self-pay | Admitting: Rehabilitative and Restorative Service Providers"

## 2012-01-04 ENCOUNTER — Ambulatory Visit (INDEPENDENT_AMBULATORY_CARE_PROVIDER_SITE_OTHER): Payer: Medicare Other

## 2012-01-04 ENCOUNTER — Ambulatory Visit: Payer: Medicare Other | Admitting: Rehabilitative and Restorative Service Providers"

## 2012-01-04 ENCOUNTER — Ambulatory Visit (INDEPENDENT_AMBULATORY_CARE_PROVIDER_SITE_OTHER): Payer: Medicare Other | Admitting: Psychology

## 2012-01-04 ENCOUNTER — Ambulatory Visit: Payer: Medicare Other | Admitting: Physician Assistant

## 2012-01-04 DIAGNOSIS — F331 Major depressive disorder, recurrent, moderate: Secondary | ICD-10-CM

## 2012-01-04 DIAGNOSIS — J309 Allergic rhinitis, unspecified: Secondary | ICD-10-CM

## 2012-01-04 DIAGNOSIS — F4310 Post-traumatic stress disorder, unspecified: Secondary | ICD-10-CM

## 2012-01-05 ENCOUNTER — Other Ambulatory Visit: Payer: Medicare Other | Admitting: Lab

## 2012-01-05 ENCOUNTER — Ambulatory Visit: Payer: Medicare Other | Admitting: Physician Assistant
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
## 2012-01-06 ENCOUNTER — Ambulatory Visit: Payer: Medicare Other | Admitting: Physical Therapy

## 2012-01-07 ENCOUNTER — Telehealth: Payer: Self-pay | Admitting: Hematology and Oncology

## 2012-01-07 ENCOUNTER — Other Ambulatory Visit (HOSPITAL_BASED_OUTPATIENT_CLINIC_OR_DEPARTMENT_OTHER): Payer: Medicare Other

## 2012-01-07 ENCOUNTER — Encounter: Payer: Self-pay | Admitting: Hematology and Oncology

## 2012-01-07 ENCOUNTER — Ambulatory Visit (HOSPITAL_BASED_OUTPATIENT_CLINIC_OR_DEPARTMENT_OTHER): Payer: Medicare Other | Admitting: Hematology and Oncology

## 2012-01-07 VITALS — BP 130/72 | HR 68 | Temp 97.4°F | Ht 62.5 in | Wt 301.4 lb

## 2012-01-07 DIAGNOSIS — D638 Anemia in other chronic diseases classified elsewhere: Secondary | ICD-10-CM

## 2012-01-07 DIAGNOSIS — D649 Anemia, unspecified: Secondary | ICD-10-CM

## 2012-01-07 DIAGNOSIS — D539 Nutritional anemia, unspecified: Secondary | ICD-10-CM

## 2012-01-07 DIAGNOSIS — Z7901 Long term (current) use of anticoagulants: Secondary | ICD-10-CM

## 2012-01-07 LAB — CBC WITH DIFFERENTIAL/PLATELET
BASO%: 1 % (ref 0.0–2.0)
EOS%: 3 % (ref 0.0–7.0)
LYMPH%: 43.1 % (ref 14.0–49.7)
MCH: 21.2 pg — ABNORMAL LOW (ref 25.1–34.0)
MCHC: 31 g/dL — ABNORMAL LOW (ref 31.5–36.0)
MCV: 68.3 fL — ABNORMAL LOW (ref 79.5–101.0)
MONO%: 7.2 % (ref 0.0–14.0)
Platelets: 316 10*3/uL (ref 145–400)
RBC: 4.97 10*6/uL (ref 3.70–5.45)
WBC: 6.9 10*3/uL (ref 3.9–10.3)

## 2012-01-07 LAB — BASIC METABOLIC PANEL
Calcium: 9.8 mg/dL (ref 8.4–10.5)
Creatinine, Ser: 0.76 mg/dL (ref 0.50–1.10)
Sodium: 140 mEq/L (ref 135–145)

## 2012-01-07 NOTE — Patient Instructions (Signed)
Michele [name]  [id number]  [facility] Discharge Instructions  RECOMMENDATIONS MADE BY THE CONSULTANT AND ANY TEST RESULTS WILL BE SENT TO YOUR REFERRING DOCTOR.   EXAM FINDINGS BY MD TODAY AND SIGNS AND SYMPTOMS TO REPORT TO CLINIC OR PRIMARY MD:   Your current list of medications are: Current Outpatient Prescriptions  Medication Sig Dispense Refill  . albuterol (PROVENTIL HFA) 108 (90 BASE) MCG/ACT inhaler Inhale 2 puffs into the lungs every 6 (six) hours as needed.  1 Inhaler  3  . albuterol (PROVENTIL) (2.5 MG/3ML) 0.083% nebulizer solution Take 3 mLs (2.5 mg total) by nebulization every 6 (six) hours as needed.  360 mL  2  . ARIPiprazole (ABILIFY) 15 MG tablet Take 1 tablet (15 mg total) by mouth daily.  30 tablet  2  . aspirin 81 MG tablet Take 81 mg by mouth daily.        . baclofen (LIORESAL) 10 MG tablet Take 10 mg by mouth 3 (three) times daily.      . benztropine (COGENTIN) 0.5 MG tablet Take 1 tablet (0.5 mg total) by mouth at bedtime.  30 tablet  2  . diltiazem (CARDIZEM CD) 240 MG 24 hr capsule Take 240 mg by mouth daily.        . fexofenadine (ALLEGRA) 60 MG tablet Take 1 tablet (60 mg total) by mouth daily. Take 60 mg by mouth daily.  30 tablet  11  . Fluticasone-Salmeterol (ADVAIR DISKUS) 250-50 MCG/DOSE AEPB Inhale 1 puff into the lungs every 12 (twelve) hours.        . furosemide (LASIX) 40 MG tablet TAKE ONE (1) TABLET BY MOUTH EVERY      DAY  30 tablet  0  . HYDROcodone-acetaminophen (NORCO) 5-325 MG per tablet Take 1 tablet by mouth every 6 (six) hours as needed.        . isosorbide dinitrate (ISORDIL) 30 MG tablet Take 1 by mouth daily       . LORazepam (ATIVAN) 0.5 MG tablet Take 1 tablet (0.5 mg total) by mouth daily.  30 tablet  1  . metFORMIN (GLUMETZA) 500 MG (MOD) 24 hr tablet Take 500 mg by mouth daily with breakfast.        . metoprolol (TOPROL-XL) 100 MG 24 hr tablet Take 100 mg by mouth daily.        . mometasone (NASONEX) 50 MCG/ACT  nasal spray Place 2 sprays into the nose daily.        Marland Kitchen morphine (MSIR) 15 MG tablet Take 1 by mouth at bedtime        . potassium chloride (KLOR-CON) 20 MEQ packet Take 1 by mouth daily       . rosuvastatin (CRESTOR) 10 MG tablet Take 1 tablet (10 mg total) by mouth every other day.  30 tablet  0  . SANCTURA XR 60 MG CP24 Take 1 capsule by mouth daily.      . sertraline (ZOLOFT) 100 MG tablet Take 2 by mouth once daily  60 tablet  2  . valsartan-hydrochlorothiazide (DIOVAN-HCT) 160-12.5 MG per tablet Take 1 tablet by mouth daily.        Marland Kitchen warfarin (COUMADIN) 7.5 MG tablet Take as directed by Dr. Alvester Chou.      . nitroGLYCERIN (NITROSTAT) 0.4 MG SL tablet Place 0.4 mg under the tongue every 5 (five) minutes as needed.           INSTRUCTIONS GIVEN AND DISCUSSED:   SPECIAL INSTRUCTIONS/FOLLOW-UP:  See  above.  I acknowledge that I have been informed and understand all the instructions given to me and received a copy. I do not have any more questions at this time, but understand that I may call the [facility] at [phone] during business hours should I have any further questions or need assistance in obtaining follow-up care.

## 2012-01-07 NOTE — Telephone Encounter (Signed)
appts made and printed for pt aom °

## 2012-01-07 NOTE — Progress Notes (Signed)
This office note has been dictated.

## 2012-01-07 NOTE — Progress Notes (Signed)
CC:   Michele [name]. Michele [name], M.D. [name], MD, [name]  IDENTIFYING STATEMENT:  The patient is a 61 year old woman with anemia, who presents for followup.  INTERVAL HISTORY:  The patient was last seen in September 2012.  Has a number of medical issues.  Her current concern is ongoing weight gain. She has attempted to lose some weight.  She is receiving physical therapy twice a week for her neck and back.  She also received steroid injections to the left knee.  Has had no history of overt blood loss. Denies rectal bleeding.  Denies hematuria.  MEDICATIONS:  Reviewed and updated.  PHYSICAL EXAMINATION:  The patient is a well-appearing, well-nourished woman in no distress.  Vitals:  Pulse 68, blood pressure 130/72, temperature 97.4, respirations 22, weight 201.4 pounds.  HEENT:  Head is atraumatic, normocephalic.  Sclerae anicteric.  Mouth moist.  Chest/CVS: Unremarkable.  Abdomen:  Obese, soft.  Extremities:  Plus-plus edema.  LABORATORY DATA:  01/07/2012:  White cell count 6.9, hemoglobin 10.5 (7.4), hematocrit 34, platelets 216.  TIBC, ferritin, iron and B12 pending.  IMPRESSION AND PLAN:  Michele [name] is a 61 year old woman with a history of iron-deficiency anemia and anemia of chronic disease.  I do not have her ferritin stores back as yet.  When I get them, I will be telephoning her with results and recommendations.  If she still continues to maintain her iron, she follows up as she is already doing in 9 months' time.    ______________________________ Nira Retort, M.D. LIO/MEDQ  D:  01/07/2012  T:  01/07/2012  Job:  [id number]

## 2012-01-08 LAB — IRON AND TIBC
%SAT: 19 % — ABNORMAL LOW (ref 20–55)
Iron: 58 ug/dL (ref 42–145)
TIBC: 305 ug/dL (ref 250–470)

## 2012-01-11 ENCOUNTER — Ambulatory Visit: Payer: Medicare Other | Admitting: Physical Therapy

## 2012-01-12 ENCOUNTER — Encounter: Payer: Self-pay | Admitting: Emergency Medicine

## 2012-01-13 ENCOUNTER — Encounter: Payer: Self-pay | Admitting: Physical Therapy

## 2012-01-13 ENCOUNTER — Ambulatory Visit: Payer: Medicare Other | Admitting: Physical Therapy

## 2012-01-17 ENCOUNTER — Ambulatory Visit: Payer: Medicare Other | Attending: Physical Medicine and Rehabilitation | Admitting: Physical Therapy

## 2012-01-17 DIAGNOSIS — IMO0001 Reserved for inherently not codable concepts without codable children: Secondary | ICD-10-CM | POA: Insufficient documentation

## 2012-01-17 DIAGNOSIS — M542 Cervicalgia: Secondary | ICD-10-CM | POA: Insufficient documentation

## 2012-01-17 DIAGNOSIS — R293 Abnormal posture: Secondary | ICD-10-CM | POA: Insufficient documentation

## 2012-01-19 ENCOUNTER — Ambulatory Visit (INDEPENDENT_AMBULATORY_CARE_PROVIDER_SITE_OTHER): Payer: Medicare Other | Admitting: Psychology

## 2012-01-19 ENCOUNTER — Ambulatory Visit (INDEPENDENT_AMBULATORY_CARE_PROVIDER_SITE_OTHER): Payer: Medicare Other

## 2012-01-19 ENCOUNTER — Ambulatory Visit: Payer: Medicare Other | Admitting: Physical Therapy

## 2012-01-19 DIAGNOSIS — F4310 Post-traumatic stress disorder, unspecified: Secondary | ICD-10-CM

## 2012-01-19 DIAGNOSIS — F331 Major depressive disorder, recurrent, moderate: Secondary | ICD-10-CM

## 2012-01-19 DIAGNOSIS — J309 Allergic rhinitis, unspecified: Secondary | ICD-10-CM

## 2012-01-19 NOTE — Progress Notes (Signed)
   THERAPIST PROGRESS NOTE  Session Time: 10.15am-11am  Participation Level: Active  Behavioral Response: Well GroomedAlertMood WNL Type of Therapy: Individual Therapy  Treatment Goals addressed: Diagnosis: PTSD, MDD and goal 1&2.  Interventions: CBT and Strength-based  Summary: Michele [name] is a 61 y.o. female who presents with affect congruent w/ mood- pt reports doing better w/ mood as having a lot of focus on interactions w/ granddaughters who are visiting.  Pt reported that she has been less depressed and less feelings of threatened-but recognizes as result of focus on relationship of grandmother.  Pt did reported that she has been focusing on not blaming self and reframing thoughts when this occurs- but is difficult.  Pt discussed want to connect w/ others- friendships and discussed ways in which small steps could take that feels safe and secure for her (ie. Bringing snack to bible study group).  Pt discussed financial stressors and seeking scholarship through [employer] to assist w/ medical expenses.  Suicidal/Homicidal: Nowithout intent/plan  Therapist Response: Assessed tp current functioning per her report.  Processed w/ pt her interactions w/ her granddaughters and strengths of these interactions.  Reflected to pt transition in a week when they return home.  Reiterated positive self talk and re framing blaming talk.  Encouraged and assisted pt in identifying small steps w/ already established relationships.    Plan: Return again in 3 weeks.  Diagnosis: Axis I: Post Traumatic Stress Disorder and MDD    Axis II: No diagnosis    [place], [place] 01/19/2012

## 2012-01-25 ENCOUNTER — Ambulatory Visit: Payer: Medicare Other | Admitting: Rehabilitative and Restorative Service Providers"

## 2012-01-27 ENCOUNTER — Ambulatory Visit: Payer: Medicare Other | Admitting: Rehabilitative and Restorative Service Providers"

## 2012-02-01 ENCOUNTER — Ambulatory Visit (INDEPENDENT_AMBULATORY_CARE_PROVIDER_SITE_OTHER): Payer: Medicare Other

## 2012-02-01 ENCOUNTER — Ambulatory Visit: Payer: Medicare Other | Admitting: Rehabilitative and Restorative Service Providers"

## 2012-02-01 DIAGNOSIS — J309 Allergic rhinitis, unspecified: Secondary | ICD-10-CM

## 2012-02-02 ENCOUNTER — Ambulatory Visit (INDEPENDENT_AMBULATORY_CARE_PROVIDER_SITE_OTHER): Payer: Medicare Other

## 2012-02-02 DIAGNOSIS — J309 Allergic rhinitis, unspecified: Secondary | ICD-10-CM

## 2012-02-03 ENCOUNTER — Ambulatory Visit: Payer: Medicare Other | Admitting: Rehabilitative and Restorative Service Providers"

## 2012-02-08 ENCOUNTER — Encounter: Payer: Self-pay | Admitting: Internal Medicine

## 2012-02-08 ENCOUNTER — Ambulatory Visit: Payer: Medicare Other | Admitting: Rehabilitative and Restorative Service Providers"

## 2012-02-09 ENCOUNTER — Other Ambulatory Visit: Payer: Self-pay | Admitting: Internal Medicine

## 2012-02-09 ENCOUNTER — Other Ambulatory Visit: Payer: Self-pay | Admitting: Physician Assistant

## 2012-02-10 ENCOUNTER — Encounter: Payer: Self-pay | Admitting: Rehabilitative and Restorative Service Providers"

## 2012-02-10 ENCOUNTER — Ambulatory Visit (INDEPENDENT_AMBULATORY_CARE_PROVIDER_SITE_OTHER): Payer: Medicare Other | Admitting: Psychology

## 2012-02-10 ENCOUNTER — Ambulatory Visit (INDEPENDENT_AMBULATORY_CARE_PROVIDER_SITE_OTHER): Payer: Medicare Other

## 2012-02-10 DIAGNOSIS — F331 Major depressive disorder, recurrent, moderate: Secondary | ICD-10-CM

## 2012-02-10 DIAGNOSIS — J309 Allergic rhinitis, unspecified: Secondary | ICD-10-CM

## 2012-02-10 DIAGNOSIS — F4310 Post-traumatic stress disorder, unspecified: Secondary | ICD-10-CM

## 2012-02-10 NOTE — Progress Notes (Signed)
   THERAPIST PROGRESS NOTE  Session Time: 1pm-1:45pm  Participation Level: Active  Behavioral Response: Well GroomedAlertDepressed  Type of Therapy: Individual Therapy  Treatment Goals addressed: Diagnosis: MDD, PTSD and goal 1.  Interventions: CBT, Strength-based and Supportive  Summary: Michele [name] is a 61 y.o. female who presents with depressed affect and quiet. Pt acknowledged quiet but reported "i'm doing ok" and denied increased depression.  Pt discussed how she has completed physical therapy this week.  She also discussed interactions w/ her granddaughter who is visiting till next week and has enjoyed the company.  Pt acknowledged that w/ grandaughter leaving and grandchildren starting school back- increased time she will have individually and need to focus on positive self care to avoid loneliness.  Pt identified swimming for physcial activity and increased volunteer w/ [employer] as activities to commit to.   Suicidal/Homicidal: Nowithout intent/plan  Therapist Response: Assessed pt current functioning per pt report.  Explored w/ pt interactions recent and upcoming change w/ grandchildren returning home/school.   Discussed w/pt importance of planning for this transition w/ focus on gratitude and positive self care as well as other opportunities for interactions w/ others.  Plan: Return again in 2 weeks.  Diagnosis: Axis I: Post Traumatic Stress Disorder, MDD     Axis II: No diagnosis    [name],[name], LPC 02/10/2012

## 2012-02-24 ENCOUNTER — Ambulatory Visit (HOSPITAL_COMMUNITY): Payer: Self-pay | Admitting: Psychology

## 2012-02-24 ENCOUNTER — Encounter: Payer: Self-pay | Admitting: Emergency Medicine

## 2012-03-06 ENCOUNTER — Ambulatory Visit (INDEPENDENT_AMBULATORY_CARE_PROVIDER_SITE_OTHER): Payer: Medicare Other | Admitting: Psychology

## 2012-03-06 ENCOUNTER — Ambulatory Visit (INDEPENDENT_AMBULATORY_CARE_PROVIDER_SITE_OTHER): Payer: Medicare Other

## 2012-03-06 DIAGNOSIS — F331 Major depressive disorder, recurrent, moderate: Secondary | ICD-10-CM

## 2012-03-06 DIAGNOSIS — J309 Allergic rhinitis, unspecified: Secondary | ICD-10-CM

## 2012-03-06 DIAGNOSIS — F4310 Post-traumatic stress disorder, unspecified: Secondary | ICD-10-CM
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
## 2012-03-23 ENCOUNTER — Ambulatory Visit (INDEPENDENT_AMBULATORY_CARE_PROVIDER_SITE_OTHER): Payer: Medicare Other

## 2012-03-23 ENCOUNTER — Ambulatory Visit (INDEPENDENT_AMBULATORY_CARE_PROVIDER_SITE_OTHER): Payer: Medicare Other | Admitting: Psychology

## 2012-03-23 DIAGNOSIS — F4310 Post-traumatic stress disorder, unspecified: Secondary | ICD-10-CM

## 2012-03-23 DIAGNOSIS — F331 Major depressive disorder, recurrent, moderate: Secondary | ICD-10-CM

## 2012-03-23 DIAGNOSIS — J309 Allergic rhinitis, unspecified: Secondary | ICD-10-CM

## 2012-03-23 NOTE — Progress Notes (Signed)
   THERAPIST PROGRESS NOTE  Session Time: 2:05pm-3:02pm  Participation Level: Active  Behavioral Response: Well GroomedAlertDepressed  Type of Therapy: Individual Therapy  Treatment Goals addressed: Diagnosis: MDD, PTSD and goal 1.  Interventions: CBT and Strength-based  Summary: Michele [name] is a 61 y.o. female who presents with report depressed mood but improved from last session.  Pt reported that she has been focusing on gratitude and support of siblings for coping.  Pt discussed mom past trauma but was able to focus on how coped through w/ community support and focus on caring for her children.  Pt was able to reflect on progress made on living life despite trauma and refocus on present and how she approaches today.   Suicidal/Homicidal: Nowithout intent/plan  Therapist Response: Assessed pt current functioning per pt report.  Processed w/pt coping skills that assisted in improvement from last session.  Redirected pt to focus on present and strengths based focus.    Plan: Return again in 2 weeks.  Diagnosis: Axis I: Post Traumatic Stress Disorder and MDD, recurrent    Axis II: No diagnosis    [name],[name], LPC 03/23/2012

## 2012-03-24 ENCOUNTER — Telehealth: Payer: Self-pay | Admitting: Internal Medicine

## 2012-03-24 MED ORDER — ALBUTEROL SULFATE HFA 108 (90 BASE) MCG/ACT IN AERS: 2.0000 | INHALATION_SPRAY | Freq: Four times a day (QID) | RESPIRATORY_TRACT | Status: DC | PRN

## 2012-03-24 NOTE — Telephone Encounter (Signed)
Called and spoke with pharmacy and they ran the proventil through and the copay for this was 6 dollars.  This has been sent in to the pharmacy with 5 refills for the pt.  Nothing further is needed.

## 2012-03-28 ENCOUNTER — Ambulatory Visit (INDEPENDENT_AMBULATORY_CARE_PROVIDER_SITE_OTHER): Payer: Medicare Other | Admitting: Emergency Medicine

## 2012-03-28 VITALS — BP 115/73 | HR 71 | Temp 98.5°F | Resp 16 | Ht 62.0 in | Wt 297.0 lb

## 2012-03-28 DIAGNOSIS — I2510 Atherosclerotic heart disease of native coronary artery without angina pectoris: Secondary | ICD-10-CM

## 2012-03-28 DIAGNOSIS — E669 Obesity, unspecified: Secondary | ICD-10-CM

## 2012-03-28 DIAGNOSIS — C9200 Acute myeloblastic leukemia, not having achieved remission: Secondary | ICD-10-CM

## 2012-03-28 DIAGNOSIS — E119 Type 2 diabetes mellitus without complications: Secondary | ICD-10-CM

## 2012-03-28 LAB — POCT GLYCOSYLATED HEMOGLOBIN (HGB A1C): Hemoglobin A1C: 6.3

## 2012-03-28 NOTE — Progress Notes (Signed)
  Subjective:    Patient ID: Michele [name], female    DOB: [birth year], 61 y.o.   MRN: [id number]  HPI patient overall has been doing well except she has not been losing weight as she would like to. She has been to see Dr. Ethelene Hal and is on an exercise program. She overall is doing well. She has not any chest pain or shortness of breath or other new symptoms .    Review of Systems     Objective:   Physical Exam  Constitutional: She appears well-developed.       Patient has morbid obesity.  HENT:  Head: Normocephalic.  Neck: No thyromegaly present.  Cardiovascular: Normal rate and regular rhythm.  Exam reveals no gallop and no friction rub.   No murmur heard. Pulmonary/Chest: Effort normal and breath sounds normal. She has no wheezes.  Skin:       Examination of the feet reveal good pulses without any ulcerations .    Results for orders placed in visit on 03/28/12  GLUCOSE, POCT (MANUAL RESULT ENTRY)      Component Value Range   POC Glucose 114 (*) 70 - 99 mg/dl  POCT GLYCOSYLATED HEMOGLOBIN (HGB A1C)      Component Value Range   Hemoglobin A1C 6.3          Assessment & Plan:  We'll check glucose and A1c today . She is currently at goal with A1c of 6.3 no changes .

## 2012-03-29 ENCOUNTER — Ambulatory Visit (INDEPENDENT_AMBULATORY_CARE_PROVIDER_SITE_OTHER): Payer: Medicare Other | Admitting: Psychiatry

## 2012-03-29 ENCOUNTER — Encounter (HOSPITAL_COMMUNITY): Payer: Self-pay | Admitting: Psychiatry

## 2012-03-29 VITALS — BP 150/97 | HR 107 | Wt 300.0 lb

## 2012-03-29 DIAGNOSIS — F4310 Post-traumatic stress disorder, unspecified: Secondary | ICD-10-CM

## 2012-03-29 DIAGNOSIS — F331 Major depressive disorder, recurrent, moderate: Secondary | ICD-10-CM

## 2012-03-29 DIAGNOSIS — F323 Major depressive disorder, single episode, severe with psychotic features: Secondary | ICD-10-CM

## 2012-03-29 MED ORDER — ARIPIPRAZOLE 15 MG PO TABS: 15.0000 mg | ORAL_TABLET | Freq: Every day | ORAL | Status: DC

## 2012-03-29 MED ORDER — BENZTROPINE MESYLATE 0.5 MG PO TABS: 0.5000 mg | ORAL_TABLET | Freq: Every day | ORAL | Status: DC

## 2012-03-29 MED ORDER — SERTRALINE HCL 100 MG PO TABS: ORAL_TABLET | ORAL | Status: DC

## 2012-03-29 MED ORDER — LORAZEPAM 0.5 MG PO TABS
0.5000 mg | ORAL_TABLET | Freq: Every day | ORAL | Status: DC
Start: 2012-03-29 — End: 2012-12-14
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
## 2012-04-06 ENCOUNTER — Ambulatory Visit (INDEPENDENT_AMBULATORY_CARE_PROVIDER_SITE_OTHER): Payer: Medicare Other | Admitting: Psychology

## 2012-04-06 DIAGNOSIS — F331 Major depressive disorder, recurrent, moderate: Secondary | ICD-10-CM

## 2012-04-06 DIAGNOSIS — F4310 Post-traumatic stress disorder, unspecified: Secondary | ICD-10-CM

## 2012-04-06 NOTE — Progress Notes (Signed)
   THERAPIST PROGRESS NOTE  Session Time: 1:45pm-2:25pm  Participation Level: Active  Behavioral Response: Well GroomedAlertDepressed  Type of Therapy: Individual Therapy  Treatment Goals addressed: Diagnosis: MDD, PTSD and goal 1.  Interventions: CBT and Strength-based  Summary: Michele [name] is a 61 y.o. female who presents with depressed affect.  Pt reports feeling down today and moving slowly- but not very depressed in mood.  Pt identified need to focus on surrounding self w/ supports, getting out of the house and focusing on gratitude.  Pt did acnknolwedge struggle w/ feeling judging her lack of trust towards others given her history.   Suicidal/Homicidal: Nowithout intent/plan  Therapist Response: Assessed pt current functioning per pt report.  Processed w/ pt her depressed mood and focused pt on use of coping skills and her strengths.  Challenged pt to reframe and not take judgemental stance towards self.  Plan: Return again in 2 weeks.  Diagnosis: Axis I: Post Traumatic Stress Disorder and MDD    Axis II: No diagnosis    [name],[name], LPC 04/06/2012

## 2012-04-20 ENCOUNTER — Ambulatory Visit (INDEPENDENT_AMBULATORY_CARE_PROVIDER_SITE_OTHER): Payer: Medicare Other

## 2012-04-20 ENCOUNTER — Ambulatory Visit (INDEPENDENT_AMBULATORY_CARE_PROVIDER_SITE_OTHER): Payer: Medicare Other | Admitting: Psychology

## 2012-04-20 DIAGNOSIS — J309 Allergic rhinitis, unspecified: Secondary | ICD-10-CM

## 2012-04-20 DIAGNOSIS — F331 Major depressive disorder, recurrent, moderate: Secondary | ICD-10-CM

## 2012-04-20 DIAGNOSIS — F4310 Post-traumatic stress disorder, unspecified: Secondary | ICD-10-CM

## 2012-04-20 DIAGNOSIS — Z23 Encounter for immunization: Secondary | ICD-10-CM

## 2012-04-20 NOTE — Progress Notes (Signed)
   THERAPIST PROGRESS NOTE  Session Time: 12:30pm-1:23pm  Participation Level: Active  Behavioral Response: Well GroomedAlertDysphoric  Type of Therapy: Individual Therapy  Treatment Goals addressed: Diagnosis: MDD, PTSD and goal 1And2.   Interventions: CBT and Solution Focused  Summary: Michele [name] is a 61 y.o. female who presents with report of feeling lonely and congruent affect.  Pt reported that she has been feeling the want for more companionship.  Pt reports that she get to go to grandson's game last week, out w/ grandaughter last night- but not many other outings.  Pt discussed did pass up one opportunity as didn't feel like getting dressed up- not enough notice.  Pt good insight as spends a lot of social outings in connection w/ sister- but different paces and personalities.  Pt shared want to go fishing.  Pt discussed possibilities for outings to increase contacts w/ others w/ acceptance of apprehension she feels socially given her hx..   Suicidal/Homicidal: Nowithout intent/plan  Therapist Response: ASsessed pt current functioning per pt report.  Processed w/pt her loneliness and discussed ways of seeking out increased opportunities to fill this need and understanding role dx plays and how to cope through for opportunities that match her and comfort w/.   Plan: Return again in 2 weeks.  Diagnosis: Axis I: Post Traumatic Stress Disorder and MDD    Axis II: No diagnosis    Eboni Coval, LPC 04/20/2012

## 2012-04-24 DIAGNOSIS — Z23 Encounter for immunization: Secondary | ICD-10-CM

## 2012-05-01 ENCOUNTER — Other Ambulatory Visit: Payer: Self-pay | Admitting: Emergency Medicine

## 2012-05-04 ENCOUNTER — Ambulatory Visit (INDEPENDENT_AMBULATORY_CARE_PROVIDER_SITE_OTHER): Payer: Medicare Other

## 2012-05-04 ENCOUNTER — Ambulatory Visit (INDEPENDENT_AMBULATORY_CARE_PROVIDER_SITE_OTHER): Payer: Medicare Other | Admitting: Psychology

## 2012-05-04 DIAGNOSIS — F4310 Post-traumatic stress disorder, unspecified: Secondary | ICD-10-CM

## 2012-05-04 DIAGNOSIS — J309 Allergic rhinitis, unspecified: Secondary | ICD-10-CM

## 2012-05-04 DIAGNOSIS — F331 Major depressive disorder, recurrent, moderate: Secondary | ICD-10-CM

## 2012-05-04 NOTE — Progress Notes (Signed)
   THERAPIST PROGRESS NOTE  Session Time: 1.25-2:20pm  Participation Level: Active  Behavioral Response: Well GroomedAlertDepressed  Type of Therapy: Individual Therapy  Treatment Goals addressed: Diagnosis: MDD and goal 1.  Interventions: CBT and Strength-based  Summary: Michele [name] is a 61 y.o. female who presents with slightly depressed affect and report of depressed mood over past several days.  Pt identified that the cloudy weather increased her feeling like staying in bed and not going out, which in turn led to more depressed moods.  Pt reported awareness of this and so made goal to get out today.  She reports that she has been to an appoinment and getting errands done and this has been very beneficial for her mood- feeling lightening of depressed mood. Pt reports that she has other activities planned for the next couple of days and will continue to keep mindful of this..   Suicidal/Homicidal: Nowithout intent/plan  Therapist Response: Assessed pt current functioning per pt report. PRocessed w/ pt depressed mood and contributing factors.  Reflected pt strength of her insight and making a behavioral change that helped w/ her mood today.  Discussed keeping mindful of her strengths and importance of keeping engaged in activities outside of her home.    Plan: Return again in 2 weeks.  Diagnosis: Axis I: MDD    Axis II: No diagnosis    Carlyne Keehan, LPC 05/04/2012

## 2012-05-18 ENCOUNTER — Ambulatory Visit (INDEPENDENT_AMBULATORY_CARE_PROVIDER_SITE_OTHER): Payer: Medicare Other | Admitting: Psychology

## 2012-05-18 ENCOUNTER — Ambulatory Visit (INDEPENDENT_AMBULATORY_CARE_PROVIDER_SITE_OTHER): Payer: Medicare Other

## 2012-05-18 DIAGNOSIS — F331 Major depressive disorder, recurrent, moderate: Secondary | ICD-10-CM

## 2012-05-18 DIAGNOSIS — J309 Allergic rhinitis, unspecified: Secondary | ICD-10-CM

## 2012-05-18 NOTE — Progress Notes (Signed)
   THERAPIST PROGRESS NOTE  Session Time: 12:35pm-1:20pm  Participation Level: Active  Behavioral Response: Well GroomedAlertDepressed  Type of Therapy: Individual Therapy  Treatment Goals addressed: Diagnosis: MDD and goal 1 & 2.  Interventions: CBT and Supportive  Summary: Michele [name] is a 61 y.o. female who presents with hr daughter [name] for counseling today.  Pt reports that she is feeling very down and hasn't wanted to get out of the house.  Daughter reports she has to push her to get out of house, but seems to enjoy once she does- pt agreed.  Daughter inquired about her dx and pt shared.  Daughter asked how to be support to her.  Pt shared that doesn't want to burden and daughter informed not.  Pt increased awareness of how she creates own barriers as to worries others will feel burdened.  Pt discussed her want for trip w/ siblings only to spend time enjoying each other and getting together outside of gathering for funeral.  Daughter was able to assist pt in challenging her "utopia" wants instead of focusing on enjoying present moment or planning for what is in her control.  Pt increased awareness of this and did express her want to spend time w/ siblings- daughter assisted her in identifying plan for visiting them.   Suicidal/Homicidal: Nowithout intent/plan  Therapist Response: Assessed pt current functioning per pt and daughter report.  Processed w/pt her current mood and contributing factors.  Facilitated communication between pt and daughter of how daughter can be support.  Assisted pt in challenging some unrealistic expectations and expressing her wants- identifying plan for accomplishing.   encouraged pt to focus on placing barriers on self inorder to better self care in day to day.  Plan: Return again in 2 weeks.  Diagnosis: Axis I: MDD    Axis II: No diagnosis    Emberlie Gotcher, LPC 05/18/2012

## 2012-06-02 ENCOUNTER — Ambulatory Visit (INDEPENDENT_AMBULATORY_CARE_PROVIDER_SITE_OTHER): Payer: Medicare Other

## 2012-06-02 DIAGNOSIS — J309 Allergic rhinitis, unspecified: Secondary | ICD-10-CM

## 2012-06-06 ENCOUNTER — Ambulatory Visit (INDEPENDENT_AMBULATORY_CARE_PROVIDER_SITE_OTHER): Payer: Medicare Other | Admitting: Psychology

## 2012-06-06 DIAGNOSIS — F331 Major depressive disorder, recurrent, moderate: Secondary | ICD-10-CM

## 2012-06-06 NOTE — Progress Notes (Signed)
   THERAPIST PROGRESS NOTE  Session Time: 1:32pm-1:18pm  Participation Level: Active  Behavioral Response: Well GroomedAlertEuthymic  Type of Therapy: Individual Therapy  Treatment Goals addressed: Diagnosis: MDD and goal 1.  Interventions: CBT and Strength-based  Summary: Michele [name] is a 61 y.o. female who presents with full and brighter affect.  Pt reports that she has been more engaged w/ getting out w/ her daughter since last visit.  Pt reported this has been positive for her mood.  Pt did report sometimes feeling tearful first thing in morning- not attributable to any thoughts/triggers- but then moving and mood improved.  Pt did reports still struggles w/ loneliness and missing her deceased siblings.  Pt did feel more hopefull and positive self talk about how she is coping.   Suicidal/Homicidal: Nowithout intent/plan  Therapist Response: Assessed pt current functioning per pt report.  Processed w/pt her reported increased activity since last visit and impact.  Explored w/ pt feelings and contributing factors and reinterated pt choices w/ how to respond and reflecting pt strengths.  Plan: Return again in 2 weeks.  Diagnosis: Axis I: MDD    Axis II: No diagnosis    Morgan Rennert, LPC 06/06/2012

## 2012-06-22 ENCOUNTER — Ambulatory Visit (INDEPENDENT_AMBULATORY_CARE_PROVIDER_SITE_OTHER): Payer: Medicare Other | Admitting: Psychology

## 2012-06-22 ENCOUNTER — Ambulatory Visit (INDEPENDENT_AMBULATORY_CARE_PROVIDER_SITE_OTHER): Payer: Medicare Other

## 2012-06-22 DIAGNOSIS — J309 Allergic rhinitis, unspecified: Secondary | ICD-10-CM

## 2012-06-22 DIAGNOSIS — F331 Major depressive disorder, recurrent, moderate: Secondary | ICD-10-CM

## 2012-06-22 NOTE — Progress Notes (Signed)
   THERAPIST PROGRESS NOTE  Session Time: 10am-10:50am  Participation Level: Active  Behavioral Response: Casual and Well GroomedAlert, WNL affect  Type of Therapy: Individual Therapy  Treatment Goals addressed: Diagnosis: MDD and goal 1.  Interventions: CBT and Strength-based  Summary: Michele [name] is a 61 y.o. female who presents with full affect and congruent to pt reported mood.  Pt reports that she has been continuing to be active w/ doing things outside of the house and remaining thankful and hopeful.  Pt does report missing home at the holidays and wishing she could get back "home".  Pt reported currently not plans for traveling but might still come together.  Pt acknowledged that holidays can be difficult as gets down about loved ones who she grieves.  Pt reported she is going to focus on staying positive and focusing on interactions w/ loved ones in present.  Pt also reported needing to make progress and take next steps to building on friendships as recognizes this would help w/ loneliness. Pt reports she is going to begin planning to invite 2 friends who she speaks to over phone to house for a lunch.  Pt increased awareness don't have to share very personal things to ready to do so.  Suicidal/Homicidal: Nowithout intent/plan  Therapist Response: Assessed pt current functioning per pt report. Processed w/pt mood and discussed holidays and impact on mood.  Explored and assisted pt in identify ways to combat increase in depression w/ grief at holidays.  Assisted pt in identifying next actions to take towards wants of furthering friendship.  Assisted pt in reframing expectation on needing to share very personal things till ready.  Plan: Return again in 3 weeks.  Diagnosis: Axis I: MDD    Axis II: No diagnosis    Michele [name], LPC 06/22/2012

## 2012-06-28 ENCOUNTER — Encounter (HOSPITAL_COMMUNITY): Payer: Self-pay | Admitting: Psychiatry

## 2012-06-28 ENCOUNTER — Ambulatory Visit (INDEPENDENT_AMBULATORY_CARE_PROVIDER_SITE_OTHER): Payer: Medicare Other | Admitting: Psychiatry

## 2012-06-28 VITALS — BP 144/93 | HR 100 | Wt 305.8 lb

## 2012-06-28 DIAGNOSIS — F4310 Post-traumatic stress disorder, unspecified: Secondary | ICD-10-CM

## 2012-06-28 DIAGNOSIS — F331 Major depressive disorder, recurrent, moderate: Secondary | ICD-10-CM

## 2012-06-28 DIAGNOSIS — F323 Major depressive disorder, single episode, severe with psychotic features: Secondary | ICD-10-CM

## 2012-06-28 MED ORDER — SERTRALINE HCL 100 MG PO TABS: ORAL_TABLET | ORAL | Status: DC

## 2012-06-28 MED ORDER — ARIPIPRAZOLE 15 MG PO TABS: 15.0000 mg | ORAL_TABLET | Freq: Every day | ORAL | Status: DC

## 2012-06-28 MED ORDER — BENZTROPINE MESYLATE 0.5 MG PO TABS: 0.5000 mg | ORAL_TABLET | Freq: Every day | ORAL | Status: DC
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
## 2012-07-07 ENCOUNTER — Ambulatory Visit (INDEPENDENT_AMBULATORY_CARE_PROVIDER_SITE_OTHER): Payer: Medicare Other

## 2012-07-07 DIAGNOSIS — J309 Allergic rhinitis, unspecified: Secondary | ICD-10-CM

## 2012-07-18 ENCOUNTER — Ambulatory Visit (INDEPENDENT_AMBULATORY_CARE_PROVIDER_SITE_OTHER): Payer: Medicare Other | Admitting: Psychology

## 2012-07-18 ENCOUNTER — Ambulatory Visit (INDEPENDENT_AMBULATORY_CARE_PROVIDER_SITE_OTHER): Payer: Medicare Other

## 2012-07-18 DIAGNOSIS — F4310 Post-traumatic stress disorder, unspecified: Secondary | ICD-10-CM

## 2012-07-18 DIAGNOSIS — F331 Major depressive disorder, recurrent, moderate: Secondary | ICD-10-CM

## 2012-07-18 DIAGNOSIS — J309 Allergic rhinitis, unspecified: Secondary | ICD-10-CM
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
## 2012-07-25 ENCOUNTER — Ambulatory Visit (INDEPENDENT_AMBULATORY_CARE_PROVIDER_SITE_OTHER): Payer: Medicare Other | Admitting: Emergency Medicine

## 2012-07-25 ENCOUNTER — Encounter: Payer: Self-pay | Admitting: Emergency Medicine

## 2012-07-25 VITALS — BP 140/88 | HR 67 | Temp 98.3°F | Resp 16 | Ht 62.0 in | Wt 305.8 lb

## 2012-07-25 DIAGNOSIS — E119 Type 2 diabetes mellitus without complications: Secondary | ICD-10-CM

## 2012-07-25 DIAGNOSIS — F339 Major depressive disorder, recurrent, unspecified: Secondary | ICD-10-CM

## 2012-07-25 DIAGNOSIS — I2510 Atherosclerotic heart disease of native coronary artery without angina pectoris: Secondary | ICD-10-CM

## 2012-07-25 LAB — POCT GLYCOSYLATED HEMOGLOBIN (HGB A1C): Hemoglobin A1C: 6

## 2012-07-25 NOTE — Progress Notes (Signed)
  Subjective:    Patient ID: Michele [name], female    DOB: [birth year], 62 y.o.   MRN: [id number]  HPI Michele [name] enters for followup of her diabetes she continues to see Dr. Derenda Fennel for pain relief. She has a cardiologist she sees regularly. She is under treatment for depression following issues at work. She is now been out of work about 3 years and overall her depression has significantly improved    Review of Systems     Objective:   Physical Exam HEENT exam is unremarkable. Neck supple. Chest was clear to auscultation and percussion. Cardiac exam unremarkable. Examination of the feet reveal normal filament testing pulses are 1+ and symmetrical there is no edema noted. Results for orders placed in visit on 07/25/12  GLUCOSE, POCT (MANUAL RESULT ENTRY)      Component Value Range   POC Glucose 113 (*) 70 - 99 mg/dl  POCT GLYCOSYLATED HEMOGLOBIN (HGB A1C)      Component Value Range   Hemoglobin A1C 6.0          Assessment & Plan:  Diabetes as at goal with A1c of 6.0. No change in medication

## 2012-08-03 ENCOUNTER — Ambulatory Visit (INDEPENDENT_AMBULATORY_CARE_PROVIDER_SITE_OTHER): Payer: Medicare Other | Admitting: Psychology

## 2012-08-03 ENCOUNTER — Ambulatory Visit (INDEPENDENT_AMBULATORY_CARE_PROVIDER_SITE_OTHER): Payer: Medicare Other

## 2012-08-03 DIAGNOSIS — F4310 Post-traumatic stress disorder, unspecified: Secondary | ICD-10-CM

## 2012-08-03 DIAGNOSIS — F331 Major depressive disorder, recurrent, moderate: Secondary | ICD-10-CM

## 2012-08-03 DIAGNOSIS — J309 Allergic rhinitis, unspecified: Secondary | ICD-10-CM
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
## 2012-08-10 ENCOUNTER — Ambulatory Visit (INDEPENDENT_AMBULATORY_CARE_PROVIDER_SITE_OTHER): Payer: Medicare Other

## 2012-08-10 ENCOUNTER — Encounter: Payer: Self-pay | Admitting: Internal Medicine

## 2012-08-10 ENCOUNTER — Ambulatory Visit (INDEPENDENT_AMBULATORY_CARE_PROVIDER_SITE_OTHER): Payer: Medicare Other | Admitting: Internal Medicine

## 2012-08-10 ENCOUNTER — Ambulatory Visit (INDEPENDENT_AMBULATORY_CARE_PROVIDER_SITE_OTHER)
Admission: RE | Admit: 2012-08-10 | Discharge: 2012-08-10 | Disposition: A | Discharge status: Home / Self Care | Payer: Medicare Other | Source: Ambulatory Visit | Attending: Internal Medicine | Admitting: Internal Medicine

## 2012-08-10 VITALS — BP 122/80 | HR 65 | Ht 62.0 in | Wt 305.8 lb

## 2012-08-10 DIAGNOSIS — I4891 Unspecified atrial fibrillation: Secondary | ICD-10-CM

## 2012-08-10 DIAGNOSIS — J3089 Other allergic rhinitis: Secondary | ICD-10-CM

## 2012-08-10 DIAGNOSIS — G4733 Obstructive sleep apnea (adult) (pediatric): Secondary | ICD-10-CM

## 2012-08-10 DIAGNOSIS — J309 Allergic rhinitis, unspecified: Secondary | ICD-10-CM

## 2012-08-10 DIAGNOSIS — G4730 Sleep apnea, unspecified: Secondary | ICD-10-CM

## 2012-08-10 DIAGNOSIS — J45909 Unspecified asthma, uncomplicated: Secondary | ICD-10-CM

## 2012-08-10 DIAGNOSIS — J302 Other seasonal allergic rhinitis: Secondary | ICD-10-CM

## 2012-08-10 NOTE — Progress Notes (Signed)
Patient ID: Michele [name], female    DOB: 21-Jul-1950, 62 y.o.   MRN: [id number]  HPI 60 yoF never smoker. Followed here for obstructive sleep apnea, allergic rhinitis, asthma, complicated by obesity, GERD and CAD.   10/12/10 - Last here April 13, 2010. She had lost her insurance due to finances. Had trouble getting her meds. We had stopped her allergy shots. Because of overactive bladder she had stopped her CPAP. We discussed use of a quick disconnect at tubing so she could leave the mask on.  Breathing now is clear.   12/03/10 Acute OV  Presents for an acute office visit. Complains of increased SOB, wheezing, PND, prod cough with green mucus, sinus pressure/congestion, bilat ear congestion, f/c/s x2.5weeks. OTC not working. Taking Advair regularly. She now has insurance.  Cough and congestion are getting worse. Has a lot of sinus pain /pressure. Has felt feverish.  Appetite is down. No n/v/d. Sugars are doing okay averaging ~126.   04/05/2011-59 yoF never smoker. Followed here for obstructive sleep apnea, allergic rhinitis, asthma, complicated by obesity, GERD and CAD. She had seen the nurse practitioner in May infection and responded to prednisone with antibiotic at that time. Since then she has gotten steroid injection in her knees for arthritis. She stayed off of CPAP because of her overactive bladder. She has not been able to lose any weight. Treatment alternatives have been reviewed and medical issues of untreated sleep apnea have been explained again.  She wants to restart allergy vaccine here because of nasal congestion postnasal drainage sneezing and itch. She had stopped allergy vaccine before when she lost insurance.  08/10/12- 82 yoF never smoker. Followed here for obstructive sleep apnea, allergic rhinitis, asthma, complicated by obesity, GERD and CAD/ stent/ warfarin. FOLLOW FOR:has CPAP-needs to be checked,not wearing for 1 yr., c/o gets real cold in nose,fits  well. Allergies vaccine 1:50 GH still helps with no problems. Stopped CPAP because the air was too cold on her nose. Snores loudly. Daytime tiredness with yawning. Frequent nocturia.  ROS-see HPI Constitutional:   No-   weight loss, night sweats, fevers, chills, +fatigue, lassitude. HEENT:   No-  headaches, difficulty swallowing, tooth/dental problems, sore throat,       No-  sneezing, itching, ear ache, nasal congestion, post nasal drip,  CV:  No-   chest pain, orthopnea, PND, swelling in lower extremities, anasarca,                                  dizziness, palpitations Resp: No-   shortness of breath with exertion or at rest.              No-   productive cough,  No non-productive cough,  No- coughing up of blood.              No-   change in color of mucus.  No- wheezing.   Skin: No-   rash or lesions. GI:  No-   heartburn, indigestion, abdominal pain, nausea, vomiting, diarrhea,                 change in bowel habits, loss of appetite GU: No-   dysuria, change in color of urine, + frequency.  No- flank pain. MS:  No-   joint pain or swelling.  No- decreased range of motion.  No- back pain. Neuro-     nothing unusual Psych:  No- change in mood  or affect. No depression or anxiety.  No memory loss.  Objective:  General- Alert, Oriented, Affect-appropriate, Distress- none acute; morbidly obese Skin- rash-none, lesions- none, excoriation- none Lymphadenopathy- none Head- atraumatic            Eyes- Gross vision intact, PERRLA, conjunctivae clear secretions            Ears- Hearing, canals-normal            Nose- mucus bridging, no- Septal dev,  polyps, erosion, perforation             Throat- Mallampati IV , mucosa clear , drainage- none, tonsils- atrophic; raspy voice Neck- flexible , trachea midline, no stridor , thyroid nl, carotid no bruit Chest - symmetrical excursion , unlabored           Heart/CV- RRR , no murmur , no gallop  , no rub, nl s1 s2                           - JVD-  none , edema- none, stasis changes- none, varices- none           Lung- clear to P&A, wheeze- none, cough- none , dullness-none, rub- none           Chest wall-  Abd-  Br/ Gen/ Rectal- Not done, not indicated Extrem- cyanosis- none, clubbing, none, atrophy- none, strength- nl; +walks with cane Neuro- grossly intact to observation  Assessment & Plan:

## 2012-08-10 NOTE — Patient Instructions (Addendum)
Order- We can continue allergy vaccine at 1:50  [facility] with a different D[facility] than [facility] for [facility]PA:P dx OSA                                   Mask of choice and supplies                                   Autotitrate 5-15 cwp x 7 days for pressure recommendation  Order- CXR   Dx Asthma, atrial fibrillation

## 2012-08-14 ENCOUNTER — Other Ambulatory Visit: Payer: Self-pay | Admitting: Physician Assistant

## 2012-08-19 ENCOUNTER — Telehealth: Payer: Self-pay | Admitting: Internal Medicine

## 2012-08-19 NOTE — Telephone Encounter (Signed)
Lvm advising 3/19 appt with LO has been cancelled. Advised new appt 3/20 @ 10.30am with MM. Also advised pt in vm to keep 3/17 lab appt.

## 2012-08-20 NOTE — Assessment & Plan Note (Signed)
Noncompliant, blaming cold air on her nose despite humidifier. She wants to change DME companies which we discussed.

## 2012-08-20 NOTE — Assessment & Plan Note (Signed)
She feels allergy vaccine has helped her and the current doses comfortable.

## 2012-08-24 ENCOUNTER — Ambulatory Visit (INDEPENDENT_AMBULATORY_CARE_PROVIDER_SITE_OTHER): Payer: Medicare Other | Admitting: Psychology

## 2012-08-24 ENCOUNTER — Ambulatory Visit (INDEPENDENT_AMBULATORY_CARE_PROVIDER_SITE_OTHER): Payer: Medicare Other

## 2012-08-24 DIAGNOSIS — J309 Allergic rhinitis, unspecified: Secondary | ICD-10-CM

## 2012-08-24 DIAGNOSIS — F331 Major depressive disorder, recurrent, moderate: Secondary | ICD-10-CM
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
## 2012-08-31 ENCOUNTER — Ambulatory Visit: Payer: Self-pay

## 2012-09-07 ENCOUNTER — Ambulatory Visit (INDEPENDENT_AMBULATORY_CARE_PROVIDER_SITE_OTHER): Payer: Medicare Other

## 2012-09-07 DIAGNOSIS — J309 Allergic rhinitis, unspecified: Secondary | ICD-10-CM

## 2012-09-08 ENCOUNTER — Ambulatory Visit (INDEPENDENT_AMBULATORY_CARE_PROVIDER_SITE_OTHER): Payer: Medicare Other | Admitting: Psychology

## 2012-09-08 DIAGNOSIS — F331 Major depressive disorder, recurrent, moderate: Secondary | ICD-10-CM

## 2012-09-08 DIAGNOSIS — F4310 Post-traumatic stress disorder, unspecified: Secondary | ICD-10-CM

## 2012-09-08 NOTE — Progress Notes (Signed)
   THERAPIST PROGRESS NOTE  Session Time: 1.18pm-2.08pm  Participation Level: Active  Behavioral Response: Well GroomedAlert, Affect sullen  Type of Therapy: Individual Therapy  Treatment Goals addressed: Diagnosis: MDD, PTSD and goal 1&2.  Interventions: CBT and Strength-based  Summary: Michele [name] is a 62 y.o. female who presents with report of feeling tired given the rainy and cloudy weather today.  Pt reported that she has been busy this week w/ sister going into the hospital and herself being busy w/ medical appointments.  Pt reported that she is overall doing well and maintaining decreased depressed moods.  Pt discusses want to go to [place] and her home town for [place]. Pt reports focus on being aware positive self talk and not putting self down.  Pt reported good self care and wanting to find a female gynecologist to an annual exam.    Suicidal/Homicidal: Nowithout intent/plan  Therapist Response: Assessed pt current functioning per her report.  Processed w/pt mood and contributing factors of weather.  Explored w/pt positive self care for her health and use of positive self talk.  Provided w/ information on gynecologist in the area that might match her needs.  Plan: Return again in 2 weeks.  Diagnosis: Axis I: Post Traumatic Stress Disorder and MDD    Axis II: No diagnosis    [name],[name], [place] 09/08/2012

## 2012-09-12 ENCOUNTER — Other Ambulatory Visit: Payer: Self-pay | Admitting: Emergency Medicine

## 2012-09-12 ENCOUNTER — Other Ambulatory Visit: Payer: Self-pay | Admitting: Physician Assistant

## 2012-09-13 NOTE — Telephone Encounter (Signed)
Please pull chart. Not rx'ed in [facility]

## 2012-09-14 ENCOUNTER — Ambulatory Visit: Payer: Self-pay

## 2012-09-20 ENCOUNTER — Telehealth: Payer: Self-pay | Admitting: Internal Medicine

## 2012-09-20 ENCOUNTER — Ambulatory Visit (INDEPENDENT_AMBULATORY_CARE_PROVIDER_SITE_OTHER): Payer: Medicare Other

## 2012-09-20 DIAGNOSIS — J309 Allergic rhinitis, unspecified: Secondary | ICD-10-CM

## 2012-09-20 NOTE — Telephone Encounter (Signed)
I spoke with pt. She stated we sent order to [facility] in Jan 2014 for CPAP but she still has not received this. She stated she has called [facility] and they are just giving her the run around. Pt is needing help with this. Please advise PCC's thanks

## 2012-09-21 ENCOUNTER — Ambulatory Visit (INDEPENDENT_AMBULATORY_CARE_PROVIDER_SITE_OTHER): Payer: Medicare Other

## 2012-09-21 ENCOUNTER — Other Ambulatory Visit (HOSPITAL_COMMUNITY): Payer: Self-pay | Admitting: *Deleted

## 2012-09-21 ENCOUNTER — Other Ambulatory Visit: Payer: Self-pay | Admitting: Physician Assistant

## 2012-09-21 DIAGNOSIS — J309 Allergic rhinitis, unspecified: Secondary | ICD-10-CM

## 2012-09-21 NOTE — Telephone Encounter (Signed)
Dup message °

## 2012-09-21 NOTE — Telephone Encounter (Signed)
Spoke to [email or web address]  they just received all infor from [facility] to switch her over and will be calling her soon pt aware Joellen Jersey

## 2012-09-26 ENCOUNTER — Encounter (HOSPITAL_COMMUNITY): Payer: Self-pay | Admitting: Psychiatry

## 2012-09-26 ENCOUNTER — Ambulatory Visit (INDEPENDENT_AMBULATORY_CARE_PROVIDER_SITE_OTHER): Payer: Medicare Other | Admitting: Psychiatry

## 2012-09-26 VITALS — Wt 306.0 lb

## 2012-09-26 DIAGNOSIS — F331 Major depressive disorder, recurrent, moderate: Secondary | ICD-10-CM

## 2012-09-26 DIAGNOSIS — F323 Major depressive disorder, single episode, severe with psychotic features: Secondary | ICD-10-CM

## 2012-09-26 DIAGNOSIS — F4310 Post-traumatic stress disorder, unspecified: Secondary | ICD-10-CM

## 2012-09-26 MED ORDER — BENZTROPINE MESYLATE 0.5 MG PO TABS: 0.5000 mg | ORAL_TABLET | Freq: Every day | ORAL | Status: DC

## 2012-09-26 MED ORDER — SERTRALINE HCL 100 MG PO TABS: ORAL_TABLET | ORAL | Status: DC

## 2012-09-26 MED ORDER — ARIPIPRAZOLE 15 MG PO TABS: 15.0000 mg | ORAL_TABLET | Freq: Every day | ORAL | Status: DC
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
## 2012-09-26 NOTE — Telephone Encounter (Signed)
CHART WITH HEATHER

## 2012-09-28 ENCOUNTER — Ambulatory Visit (INDEPENDENT_AMBULATORY_CARE_PROVIDER_SITE_OTHER): Payer: Medicare Other

## 2012-09-28 DIAGNOSIS — J309 Allergic rhinitis, unspecified: Secondary | ICD-10-CM

## 2012-09-29 ENCOUNTER — Ambulatory Visit (INDEPENDENT_AMBULATORY_CARE_PROVIDER_SITE_OTHER): Payer: Medicare Other | Admitting: Psychology

## 2012-09-29 DIAGNOSIS — F331 Major depressive disorder, recurrent, moderate: Secondary | ICD-10-CM

## 2012-09-29 DIAGNOSIS — F4310 Post-traumatic stress disorder, unspecified: Secondary | ICD-10-CM

## 2012-09-29 NOTE — Progress Notes (Addendum)
   THERAPIST PROGRESS NOTE  Session Time: 1.10pm-2:03pm  Participation Level: Active  Behavioral Response: Well GroomedAlertAnxious  Type of Therapy: Individual Therapy  Treatment Goals addressed: Diagnosis: MDD, PTSD, and goal 1.and 2.  Interventions: CBT and Strength-based  Summary: Michele [name] is a 62 y.o. female who presents with reported of worry as she is concerned Dr. Adele Schilder is mad at her re: taking medication as presribed.  Pt brought her medication bottle in of Lorazepam w/ 2 dates of 12/29/11 and 03/23/12 listed and her script from 03/29/12 which she never had filled.  Pt however is certain that she has had enough pills to be taken her Lorazepam daily.  Pt expressed reduced anxiety when increase awareness of doctors concern for her and intention for trying to understand her report of taking daily.  Pt reported that she has been working on "not beating herself up" and instead focusing on loving herself and accepting self.  Pt also reported stressor of deceased mother's birthday last week, but feels that she coped well through.  Pt reports she has received 2 invitations to attend events in [place], her hometown, and is considering going to one.  Suicidal/Homicidal: Nowithout intent/plan  Therapist Response: Assessed pt current functioning per pt report.  Explored w/pt recent moods and contributing factors.  Discussed use of self acceptance and focusing on positive self statements.   Plan: Return again in 2 weeks.  Diagnosis: Axis I: Post Traumatic Stress Disorder and MDD    Axis II: No diagnosis    Michele [name],Michele [name], [name] 09/29/2012

## 2012-10-02 ENCOUNTER — Other Ambulatory Visit (HOSPITAL_BASED_OUTPATIENT_CLINIC_OR_DEPARTMENT_OTHER): Payer: Medicare Other | Admitting: Lab

## 2012-10-02 DIAGNOSIS — D649 Anemia, unspecified: Secondary | ICD-10-CM

## 2012-10-02 DIAGNOSIS — D539 Nutritional anemia, unspecified: Secondary | ICD-10-CM

## 2012-10-02 LAB — BASIC METABOLIC PANEL (CC13)
BUN: 13.9 mg/dL (ref 7.0–26.0)
CO2: 31 mEq/L — ABNORMAL HIGH (ref 22–29)
Chloride: 101 mEq/L (ref 98–107)
Creatinine: 0.8 mg/dL (ref 0.6–1.1)
Glucose: 107 mg/dl — ABNORMAL HIGH (ref 70–99)
Potassium: 3.4 mEq/L — ABNORMAL LOW (ref 3.5–5.1)

## 2012-10-02 LAB — CBC WITH DIFFERENTIAL/PLATELET
BASO%: 1.2 % (ref 0.0–2.0)
Eosinophils Absolute: 0.2 10*3/uL (ref 0.0–0.5)
MCHC: 31.7 g/dL (ref 31.5–36.0)
MCV: 67.2 fL — ABNORMAL LOW (ref 79.5–101.0)
MONO#: 0.5 10*3/uL (ref 0.1–0.9)
MONO%: 7.2 % (ref 0.0–14.0)
NEUT#: 3.4 10*3/uL (ref 1.5–6.5)
RBC: 5.26 10*6/uL (ref 3.70–5.45)
RDW: 17.7 % — ABNORMAL HIGH (ref 11.2–14.5)
WBC: 6.9 10*3/uL (ref 3.9–10.3)

## 2012-10-02 LAB — FOLATE: Folate: 13.3 ng/mL

## 2012-10-02 LAB — IRON AND TIBC
%SAT: 18 % — ABNORMAL LOW (ref 20–55)
Iron: 54 ug/dL (ref 42–145)

## 2012-10-03 ENCOUNTER — Ambulatory Visit: Payer: Self-pay | Admitting: Cardiovascular Disease

## 2012-10-03 DIAGNOSIS — I480 Paroxysmal atrial fibrillation: Secondary | ICD-10-CM

## 2012-10-03 DIAGNOSIS — Z7901 Long term (current) use of anticoagulants: Secondary | ICD-10-CM | POA: Insufficient documentation

## 2012-10-04 ENCOUNTER — Ambulatory Visit: Payer: Self-pay | Admitting: Hematology and Oncology

## 2012-10-05 ENCOUNTER — Encounter: Payer: Self-pay | Admitting: Internal Medicine

## 2012-10-05 ENCOUNTER — Telehealth: Payer: Self-pay | Admitting: Internal Medicine

## 2012-10-05 ENCOUNTER — Ambulatory Visit: Payer: Self-pay

## 2012-10-05 ENCOUNTER — Ambulatory Visit (HOSPITAL_BASED_OUTPATIENT_CLINIC_OR_DEPARTMENT_OTHER): Payer: Medicare Other | Admitting: Internal Medicine

## 2012-10-05 VITALS — BP 130/82 | HR 72 | Temp 97.8°F | Resp 20 | Ht 62.5 in | Wt 310.0 lb

## 2012-10-05 DIAGNOSIS — D649 Anemia, unspecified: Secondary | ICD-10-CM

## 2012-10-05 NOTE — Patient Instructions (Signed)
Your hemoglobin and hematocrit are low but stable. Iron studies is unremarkable. Followup in 6 months with repeat CBC and iron study

## 2012-10-05 NOTE — Progress Notes (Signed)
[facility] Telephone:([phone]) [phone]   [phone]  OFFICE PROGRESS NOTE  DABBS,JOHN, MD [address] [place] [address]  DIAGNOSIS: Unspecified anemia questionable for anemia of chronic disease/iron deficiency.  PRIOR THERAPY: None  CURRENT THERAPY: Observation.  INTERVAL HISTORY: Michele [name] 62 y.o. female returns to the clinic today for followup visit. She is a former patient of Dr. Dalene Carrow. The patient has been followed for anemia for several years. Her hemoglobin has always ranging between 10 and 11 for the last few years. She had repeat CBC and iron study performed a few days ago and she is here for evaluation and discussion of her lab results. The patient denied having any specific complaints except for mild fatigue. She denied having any dizzy spells. She has no chest pain or shortness breath, no cough or hemoptysis. She denied having any significant weight loss or night sweats.  MEDICAL HISTORY: Past Medical History  Diagnosis Date  . Allergic rhinitis   . Asthma   . CAD (coronary artery disease)   . Hypertension   . Sleep apnea   . Diabetes mellitus type II   . Depression   . PTSD (post-traumatic stress disorder)   . 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 Carpal tunnel syndrome, right     ALLERGIES:  is allergic to meprobamate.  MEDICATIONS:  Current Outpatient Prescriptions  Medication Sig Dispense Refill  . albuterol (PROVENTIL HFA) 108 (90 BASE) MCG/ACT inhaler Inhale 2 puffs into the lungs every 6 (six) hours as needed.  1 Inhaler  5  . albuterol (PROVENTIL) (2.5 MG/3ML) 0.083% nebulizer solution Take 3 mLs (2.5 mg total) by nebulization every 6 (six) hours as needed.  360 mL  2  . ARIPiprazole (ABILIFY) 15 MG tablet Take 1 tablet (15 mg total) by mouth daily.  30 tablet  2  . aspirin 81 MG tablet Take 81 mg by mouth daily.        . baclofen (LIORESAL) 10 MG tablet TAKE ONE (1) TABLET BY MOUTH 3 TIMES DAILY  90 each  0  .  benztropine (COGENTIN) 0.5 MG tablet Take 1 tablet (0.5 mg total) by mouth at bedtime.  30 tablet  2  . CRESTOR 10 MG tablet TAKE ONE (1) TABLET BY MOUTH EVERY OTHER DAY  30 each  2  . diltiazem (CARDIZEM CD) 240 MG 24 hr capsule Take 240 mg by mouth daily.        . fexofenadine (ALLEGRA) 60 MG tablet Take 1 tablet (60 mg total) by mouth daily. Take 60 mg by mouth daily.  30 tablet  11  . Fluticasone-Salmeterol (ADVAIR DISKUS) 250-50 MCG/DOSE AEPB Inhale 1 puff into the lungs every 12 (twelve) hours.        . furosemide (LASIX) 40 MG tablet TAKE ONE (1) TABLET BY MOUTH EVERY      DAY  30 tablet  2  . HYDROcodone-acetaminophen (NORCO) 5-325 MG per tablet Take 1 tablet by mouth every 6 (six) hours as needed.        . isosorbide dinitrate (ISORDIL) 30 MG tablet Take 1 by mouth daily       . LORazepam (ATIVAN) 0.5 MG tablet Take 1 tablet (0.5 mg total) by mouth daily.  30 tablet  1  . metFORMIN (GLUCOPHAGE-XR) 500 MG 24 hr tablet TAKE ONE (1) TABLET BY MOUTH EVERY DAY  90 tablet  0  . metoprolol (TOPROL-XL) 100 MG 24 hr tablet Take  100 mg by mouth daily.        . mometasone (NASONEX) 50 MCG/ACT nasal spray Place 2 sprays into the nose daily.        Marland Kitchen morphine (MSIR) 15 MG tablet Take 1 by mouth at bedtime        . pantoprazole (PROTONIX) 20 MG tablet Take 20 mg by mouth daily.      . potassium chloride (KLOR-CON) 20 MEQ packet Take 1 by mouth daily       . SANCTURA XR 60 MG CP24 Take 1 capsule by mouth daily.      . sertraline (ZOLOFT) 100 MG tablet Take 2 by mouth once daily  60 tablet  2  . valsartan-hydrochlorothiazide (DIOVAN-HCT) 160-12.5 MG per tablet Take 1 tablet by mouth daily.        Marland Kitchen warfarin (COUMADIN) 7.5 MG tablet Take as directed by Dr. Gery Pray.      . nitroGLYCERIN (NITROSTAT) 0.4 MG SL tablet Place 0.4 mg under the tongue every 5 (five) minutes as needed.         No current facility-administered medications for this visit.    SURGICAL HISTORY:  Past Surgical History  Procedure  Laterality Date  . Cholecystectomy    . Left groin cyst    . Kidney stones    . Stress myocardial perfusion study  09/08/2010    normal; EF 75%  . Multilevel lumbar degenerative disc disease    . Hemorrhoid surgery    . Gallbladder surgery      REVIEW OF SYSTEMS:  A comprehensive review of systems was negative except for: Constitutional: positive for fatigue   PHYSICAL EXAMINATION: General appearance: alert, cooperative and no distress Head: Normocephalic, without obvious abnormality, atraumatic Neck: no adenopathy Resp: clear to auscultation bilaterally Cardio: regular rate and rhythm, S1, S2 normal, no murmur, click, rub or gallop GI: soft, non-tender; bowel sounds normal; no masses,  no organomegaly Extremities: extremities normal, atraumatic, no cyanosis or edema  ECOG PERFORMANCE STATUS: 1 - Symptomatic but completely ambulatory  Blood pressure 130/82, pulse 72, temperature 97.8 F (36.6 C), temperature source Oral, resp. rate 20, height 5' 2.5" (1.588 m), weight 310 lb (140.615 kg).  LABORATORY DATA: Lab Results  Component Value Date   WBC 6.9 10/02/2012   HGB 11.2* 10/02/2012   HCT 35.4 10/02/2012   MCV 67.2* 10/02/2012   PLT 317 10/02/2012      Chemistry      Component Value Date/Time   NA 144 10/02/2012 0905   NA 140 01/07/2012 1458   K 3.4* 10/02/2012 0905   K 3.6 01/07/2012 1458   CL 101 10/02/2012 0905   CL 100 01/07/2012 1458   CO2 31* 10/02/2012 0905   CO2 33* 01/07/2012 1458   BUN 13.9 10/02/2012 0905   BUN 14 01/07/2012 1458   CREATININE 0.8 10/02/2012 0905   CREATININE 0.76 01/07/2012 1458      Component Value Date/Time   CALCIUM 9.9 10/02/2012 0905   CALCIUM 9.8 01/07/2012 1458   ALKPHOS 87 03/30/2011 1330   AST 18 03/30/2011 1330   ALT 18 03/30/2011 1330   BILITOT 0.4 03/30/2011 1330       RADIOGRAPHIC STUDIES: No results found.  ASSESSMENT: This is a very pleasant 62 years old African American female with unspecified anemia questionable for iron  deficiency/anemia of chronic disease. Her hemoglobin and hematocrit are stable today. Her iron study showed no significant abnormalities.  PLAN: I discussed the lab result with the patient today. I  recommended for her to continue on observation with repeat CBC and iron study in 6 months. She was advised to call me immediately if she has any concerning symptoms in the interval.  All questions were answered. The patient knows to call the clinic with any problems, questions or concerns. We can certainly see the patient much sooner if necessary.

## 2012-10-06 ENCOUNTER — Ambulatory Visit (INDEPENDENT_AMBULATORY_CARE_PROVIDER_SITE_OTHER): Payer: Medicare Other

## 2012-10-06 DIAGNOSIS — J309 Allergic rhinitis, unspecified: Secondary | ICD-10-CM

## 2012-10-09 ENCOUNTER — Telehealth: Payer: Self-pay

## 2012-10-09 MED ORDER — FUROSEMIDE 40 MG PO TABS: ORAL_TABLET | ORAL | Status: DC

## 2012-10-09 NOTE — Telephone Encounter (Signed)
Patient has scheduled appointment next month.  She is out of Lasix Thompson   [phone]

## 2012-10-09 NOTE — Telephone Encounter (Signed)
Sent in for her. Called her to advise.  

## 2012-10-12 ENCOUNTER — Ambulatory Visit (INDEPENDENT_AMBULATORY_CARE_PROVIDER_SITE_OTHER): Payer: Medicare Other | Admitting: Internal Medicine

## 2012-10-12 ENCOUNTER — Encounter: Payer: Self-pay | Admitting: Internal Medicine

## 2012-10-12 ENCOUNTER — Ambulatory Visit (INDEPENDENT_AMBULATORY_CARE_PROVIDER_SITE_OTHER): Payer: Medicare Other

## 2012-10-12 VITALS — BP 142/82 | HR 90 | Ht 62.0 in | Wt 313.2 lb

## 2012-10-12 DIAGNOSIS — J309 Allergic rhinitis, unspecified: Secondary | ICD-10-CM

## 2012-10-12 DIAGNOSIS — J302 Other seasonal allergic rhinitis: Secondary | ICD-10-CM

## 2012-10-12 DIAGNOSIS — J45901 Unspecified asthma with (acute) exacerbation: Secondary | ICD-10-CM

## 2012-10-12 DIAGNOSIS — J45909 Unspecified asthma, uncomplicated: Secondary | ICD-10-CM

## 2012-10-12 DIAGNOSIS — J3089 Other allergic rhinitis: Secondary | ICD-10-CM

## 2012-10-12 DIAGNOSIS — J45998 Other asthma: Secondary | ICD-10-CM

## 2012-10-12 DIAGNOSIS — G4733 Obstructive sleep apnea (adult) (pediatric): Secondary | ICD-10-CM

## 2012-10-12 MED ORDER — LEVALBUTEROL HCL 0.63 MG/3ML IN NEBU
0.6300 mg | INHALATION_SOLUTION | Freq: Once | RESPIRATORY_TRACT | Status: AC
  Administered 2012-10-12: 0.63 mg via RESPIRATORY_TRACT

## 2012-10-12 NOTE — Assessment & Plan Note (Signed)
Acute exacerbation, unclear trigger. Possibly related to recent wet cold weather, or to pollen season.  Plan- Discussed current available meds on her list.

## 2012-10-12 NOTE — Progress Notes (Signed)
Patient ID: Michele [name], female    DOB: 01/22/1951, 62 y.o.   MRN: [id number]  HPI 79 yoF never smoker. Followed here for obstructive sleep apnea, allergic rhinitis, asthma, complicated by obesity, GERD and CAD.   10/12/10 - Last here April 13, 2010. She had lost her insurance due to finances. Had trouble getting her meds. We had stopped her allergy shots. Because of overactive bladder she had stopped her CPAP. We discussed use of a quick disconnect at tubing so she could leave the mask on.  Breathing now is clear.   12/03/10 Acute OV  Presents for an acute office visit. Complains of increased SOB, wheezing, PND, prod cough with green mucus, sinus pressure/congestion, bilat ear congestion, f/c/s x2.5weeks. OTC not working. Taking Advair regularly. She now has insurance.  Cough and congestion are getting worse. Has a lot of sinus pain /pressure. Has felt feverish.  Appetite is down. No n/v/d. Sugars are doing okay averaging ~126.   04/05/2011-59 yoF never smoker. Followed here for obstructive sleep apnea, allergic rhinitis, asthma, complicated by obesity, GERD and CAD. She had seen the nurse practitioner in May infection and responded to prednisone with antibiotic at that time. Since then she has gotten steroid injection in her knees for arthritis. She stayed off of CPAP because of her overactive bladder. She has not been able to lose any weight. Treatment alternatives have been reviewed and medical issues of untreated sleep apnea have been explained again.  She wants to restart allergy vaccine here because of nasal congestion postnasal drainage sneezing and itch. She had stopped allergy vaccine before when she lost insurance.  08/10/12- 61 yoF never smoker. Followed here for obstructive sleep apnea, allergic rhinitis, asthma, complicated by obesity, GERD and CAD/ stent/ warfarin. FOLLOW FOR:has CPAP-needs to be checked,not wearing for 1 yr., c/o gets real cold in nose,fits  well. Allergies vaccine 1:50 GH still helps with no problems. Stopped CPAP because the air was too cold on her nose. Snores loudly. Daytime tiredness with yawning. Frequent nocturia.  10/12/12- 61 yoF never smoker. Followed here for obstructive sleep apnea, allergic rhinitis, asthma, complicated by obesity, GERD and CAD/ stent/ warfarin. FOLLOWS [employer]:[facility] in today for allergy injection-SOB and wheezing-given Xopenex Tx; was given allergy injection already. She doesn't know why she began asthma flare last night. Admits seasonal nasal congestion.  Continues Allergy vaccine 1:50 GH, well tolerated.  She has cleared after neb Xopenex by allergy lab 30 minutes ago. She has not gotten back to regular use of CPAP, saying machine repairs not yet addressed since Christoper Allegra has not been out to home.  ROS-see HPI Constitutional:   No-   weight loss, night sweats, fevers, chills, +fatigue, lassitude. HEENT:   No-  headaches, difficulty swallowing, tooth/dental problems, sore throat,       +  sneezing, itching, ear ache, nasal congestion, post nasal drip,  CV:  No-   chest pain, orthopnea, PND, swelling in lower extremities, anasarca,                                  dizziness, palpitations Resp: +shortness of breath with exertion or at rest.              No-   productive cough,  No non-productive cough,  No- coughing up of blood.              No-   change in color of mucus.  +  wheezing.   Skin: No-   rash or lesions. GI:  No-   heartburn, indigestion, abdominal pain, nausea, vomiting,  GU:  MS:  No-   joint pain or swelling.   Neuro-     nothing unusual Psych:  No- change in mood or affect. No acute  depression or anxiety.  No memory loss.  Objective:  General- Alert, Oriented, Affect-appropriate, Distress- none acute; morbidly obese Skin- rash-none, lesions- none, excoriation- none Lymphadenopathy- none Head- atraumatic            Eyes- Gross vision intact, PERRLA, conjunctivae clear secretions             Ears- Hearing, canals-normal            Nose- mucus bridging, no- Septal dev,  polyps, erosion, perforation             Throat- Mallampati IV , mucosa clear , drainage- none, tonsils- atrophic; raspy voice Neck- flexible , trachea midline, no stridor , thyroid nl, carotid no bruit Chest - symmetrical excursion , unlabored           Heart/CV- RRR , no murmur , no gallop  , no rub, nl s1 s2                           - JVD- none , edema- none, stasis changes- none, varices- none           Lung- +clear to P&A after neb, shallow, wheeze- none, cough- none , dullness-none, rub- none           Chest wall-  Abd-  Br/ Gen/ Rectal- Not done, not indicated Extrem- cyanosis- none, clubbing, none, atrophy- none, strength- nl; +walks with cane Neuro- grossly intact to observation  Assessment & Plan:

## 2012-10-12 NOTE — Patient Instructions (Addendum)
With next order we will have allergy lab increase your vaccine to 1:10  You can continue your routine meds and use your nebulizer machine as often as 4 times/ day if needed  [facility] [employer] to get her CPAP machine working,   5-15 autotitration x 7 days for dx OSA pressure recomendation

## 2012-10-12 NOTE — Assessment & Plan Note (Signed)
She was wanting to change DME from [facility] to [facility] and we were going to reassess pressure requirement with autotitration. She will discuss w/ [facility] to see why this hasn't happened.

## 2012-10-12 NOTE — Assessment & Plan Note (Signed)
We will have the allergy lab advance vaccine to 1:10 with next order.

## 2012-10-13 ENCOUNTER — Other Ambulatory Visit: Payer: Self-pay | Admitting: Internal Medicine

## 2012-10-13 DIAGNOSIS — G4733 Obstructive sleep apnea (adult) (pediatric): Secondary | ICD-10-CM

## 2012-10-19 ENCOUNTER — Ambulatory Visit: Payer: Self-pay

## 2012-10-23 ENCOUNTER — Other Ambulatory Visit: Payer: Self-pay | Admitting: Internal Medicine

## 2012-10-23 MED ORDER — MOMETASONE FUROATE 50 MCG/ACT NA SUSP: 2.0000 | Freq: Every day | NASAL | Status: DC

## 2012-10-23 MED ORDER — FLUTICASONE-SALMETEROL 250-50 MCG/DOSE IN AEPB: 1.0000 | INHALATION_SPRAY | Freq: Two times a day (BID) | RESPIRATORY_TRACT | Status: DC

## 2012-10-24 ENCOUNTER — Ambulatory Visit: Payer: Medicare Other | Admitting: Emergency Medicine

## 2012-10-26 ENCOUNTER — Ambulatory Visit (INDEPENDENT_AMBULATORY_CARE_PROVIDER_SITE_OTHER): Payer: Medicare Other | Admitting: Psychology

## 2012-10-26 ENCOUNTER — Ambulatory Visit (INDEPENDENT_AMBULATORY_CARE_PROVIDER_SITE_OTHER): Payer: Medicare Other

## 2012-10-26 DIAGNOSIS — F331 Major depressive disorder, recurrent, moderate: Secondary | ICD-10-CM

## 2012-10-26 DIAGNOSIS — J309 Allergic rhinitis, unspecified: Secondary | ICD-10-CM

## 2012-10-26 NOTE — Progress Notes (Signed)
   THERAPIST PROGRESS NOTE  Session Time: 12.30pm-1.05pm  Participation Level: Active  Behavioral Response: Well GroomedAlert, AFFECT WNL  Type of Therapy: Individual Therapy  Treatment Goals addressed: Diagnosis: MDD and goal 1.  Interventions: CBT and Strength-based  Summary: Michele [name] is a 62 y.o. female who presents with feeling tired today.  Pt reported that she has had 4 appts prior to today's session and just feeling worn out now.  Pt reported that she returned from visiting her family in [place] and really enjoyed her time there. Pt discussed being thankful for time w/ family and getting a chance to go home. Pt reports will f/u w/ PCP next week about assistance/other supports towards improved wellness and daughter is going for support.  Pt denied feeling down since returning to her home in [place].  Pt reports she is continuing to focus on positive self statements.    Suicidal/Homicidal: Nowithout intent/plan  Therapist Response: Assessed pt current functioning per pt report.  Processed w/pt her trip to visit family and benefits for self. Encouraged maintaining those benefits in continued contacts.  Explored w/ pt self talk and encouraging continued positive self talk.   Plan: Return again in 2-3 weeks.  Diagnosis: Axis I: MDD    Axis II: No diagnosis    [name], LPC 10/26/2012

## 2012-10-31 ENCOUNTER — Ambulatory Visit (INDEPENDENT_AMBULATORY_CARE_PROVIDER_SITE_OTHER): Payer: Medicare Other | Admitting: Emergency Medicine

## 2012-10-31 ENCOUNTER — Encounter: Payer: Self-pay | Admitting: Emergency Medicine

## 2012-10-31 VITALS — BP 116/75 | HR 65 | Temp 98.7°F | Resp 17 | Wt 297.0 lb

## 2012-10-31 DIAGNOSIS — G8929 Other chronic pain: Secondary | ICD-10-CM

## 2012-10-31 DIAGNOSIS — E669 Obesity, unspecified: Secondary | ICD-10-CM

## 2012-10-31 DIAGNOSIS — I10 Essential (primary) hypertension: Secondary | ICD-10-CM

## 2012-10-31 DIAGNOSIS — E119 Type 2 diabetes mellitus without complications: Secondary | ICD-10-CM

## 2012-10-31 LAB — POCT GLYCOSYLATED HEMOGLOBIN (HGB A1C): Hemoglobin A1C: 6.4

## 2012-10-31 NOTE — Progress Notes (Signed)
  Subjective:    Patient ID: Michele [name], female    DOB: 1951-05-24, 62 y.o.   MRN: [id number]  HPI 62 yo female with extensive PMH. Here for follow up on diabetes. She is also concerned about the size different in her legs and pain in her knee, who she sees an orthopedist for.   With regards to diabetes, she checks her sugars one a week, which run between 270-330. Reportedly last A1c was 6.0. Checks her feet regularly. Reports mild R foot pain on and off for 2 weeks. Reports 2 bunions, but no sensory deficit. Has not seen eye doctor recently.    Review of Systems     Objective:   Physical Exam  Constitutional: She is oriented to person, place, and time. She appears well-developed and well-nourished.  HENT:  Head: Normocephalic and atraumatic.  Cardiovascular: Normal rate, regular rhythm and normal heart sounds.   Distant heart sounds  Pulmonary/Chest: Effort normal and breath sounds normal.  Neurological: She is alert and oriented to person, place, and time.   Diabetic foot exam reveals 2+ DP pulses. 1+ posterior tibial pulses. Monofilament exam normal. No lesions noted other than bunions on R 4 toe and L 3rd toe.  Results for orders placed in visit on 10/31/12  GLUCOSE, POCT (MANUAL RESULT ENTRY)      Result Value Range   POC Glucose 131 (*) 70 - 99 mg/dl  POCT GLYCOSYLATED HEMOGLOBIN (HGB A1C)      Result Value Range   Hemoglobin A1C 6.4          Assessment & Plan:  No change in treatment program continue all same

## 2012-11-02 ENCOUNTER — Ambulatory Visit: Payer: Self-pay

## 2012-11-02 ENCOUNTER — Other Ambulatory Visit: Payer: Self-pay

## 2012-11-02 MED ORDER — ROSUVASTATIN CALCIUM 10 MG PO TABS: ORAL_TABLET | ORAL | Status: DC

## 2012-11-10 ENCOUNTER — Ambulatory Visit (INDEPENDENT_AMBULATORY_CARE_PROVIDER_SITE_OTHER): Payer: Medicare Other

## 2012-11-10 DIAGNOSIS — J309 Allergic rhinitis, unspecified: Secondary | ICD-10-CM

## 2012-11-16 ENCOUNTER — Ambulatory Visit (INDEPENDENT_AMBULATORY_CARE_PROVIDER_SITE_OTHER): Payer: Medicare Other | Admitting: Psychology

## 2012-11-16 DIAGNOSIS — F331 Major depressive disorder, recurrent, moderate: Secondary | ICD-10-CM

## 2012-11-16 NOTE — Progress Notes (Signed)
   THERAPIST PROGRESS NOTE  Session Time: 12:33pm-1:22pm  Participation Level: Active  Behavioral Response: Well GroomedAlertDepressed  Type of Therapy: Individual Therapy  Treatment Goals addressed: Diagnosis: MDD and goal 1.  Interventions: CBT and Motivational Interviewing  Summary: ESHANI MAESTRE is a 62 y.o. female who presents with report of some depressed mood and affect congruent.  Pt reported she is tired as bed later than usual.  Pt reported that she was struggling w/ feeling pity today and is challenging herself to identify self care and focus on making best of her day. Pt also reports feeling more restless and shaky w/out Ativan.  Pt discussed frustration w/ challenges of pain/health and barriers for doing things as did in past.  Pt did discuss need for weight los and aware of procrastination.  Pt was able to identify change to make w/ water aerobics for exercise and continue making healthier eating choices.  Pt discussed barriers and actions to take.   Suicidal/Homicidal: Nowithout intent/plan  Therapist Response: Assessed pt current functioning per pt report.  Explored w/pt reported depressed mood and contributing thoughts.  Assisted pt in focusing on use of self care activities for coping.  Explored w/pt wants for change, discussed barriers and next steps to take.    Plan: Return again in 2 weeks.  Diagnosis: Axis I: MDD    Axis II: No diagnosis    Ryonna Cimini, LPC 11/16/2012

## 2012-11-17 ENCOUNTER — Ambulatory Visit: Payer: Self-pay

## 2012-11-24 ENCOUNTER — Ambulatory Visit (INDEPENDENT_AMBULATORY_CARE_PROVIDER_SITE_OTHER): Payer: Medicare Other

## 2012-11-24 DIAGNOSIS — J309 Allergic rhinitis, unspecified: Secondary | ICD-10-CM

## 2012-11-27 ENCOUNTER — Other Ambulatory Visit: Payer: Self-pay | Admitting: Internal Medicine

## 2012-11-30 ENCOUNTER — Ambulatory Visit (INDEPENDENT_AMBULATORY_CARE_PROVIDER_SITE_OTHER): Payer: Medicare Other

## 2012-11-30 ENCOUNTER — Ambulatory Visit (INDEPENDENT_AMBULATORY_CARE_PROVIDER_SITE_OTHER): Payer: Medicare Other | Admitting: Pharmacist Clinician (PhC)/ Clinical Pharmacy Specialist

## 2012-11-30 ENCOUNTER — Ambulatory Visit: Payer: Self-pay | Admitting: Pharmacist Clinician (PhC)/ Clinical Pharmacy Specialist

## 2012-11-30 ENCOUNTER — Ambulatory Visit (INDEPENDENT_AMBULATORY_CARE_PROVIDER_SITE_OTHER): Payer: Medicare Other | Admitting: Psychology

## 2012-11-30 VITALS — BP 120/72 | HR 76

## 2012-11-30 DIAGNOSIS — I4891 Unspecified atrial fibrillation: Secondary | ICD-10-CM

## 2012-11-30 DIAGNOSIS — F4310 Post-traumatic stress disorder, unspecified: Secondary | ICD-10-CM

## 2012-11-30 DIAGNOSIS — F331 Major depressive disorder, recurrent, moderate: Secondary | ICD-10-CM

## 2012-11-30 DIAGNOSIS — I480 Paroxysmal atrial fibrillation: Secondary | ICD-10-CM

## 2012-11-30 DIAGNOSIS — Z7901 Long term (current) use of anticoagulants: Secondary | ICD-10-CM

## 2012-11-30 DIAGNOSIS — J309 Allergic rhinitis, unspecified: Secondary | ICD-10-CM

## 2012-11-30 NOTE — Progress Notes (Signed)
   THERAPIST PROGRESS NOTE  Session Time: 12.43pm-1:15pm  Participation Level: Active  Behavioral Response: Well GroomedAlertAnxious  Type of Therapy: Individual Therapy  Treatment Goals addressed: Diagnosis: MDD, PTSD and goal 1.  Interventions: CBT and Other: Heartmath Breathing Technique  Summary: Michele [name] is a 62 y.o. female who presents with report of increased anxiety and general feeling uneasy.  Pt reported that she is feeling increased pain as well.  Pt also reports worry recent of heart functioning and her stint in the absence of any symptoms.  Pt reported that a little overwhelmed today as well as she woke late and had to reschedule a morning appt which has made her day feel more rushed.  Pt participated in heart math quick coherence breathing technique and reported feeling more calm and at ease following.  Pt recognized that she is not practicing relaxation practices as used to and reported commitment to reincorporating into her day.   Suicidal/Homicidal: Nowithout intent/plan  Therapist Response: Asssessed pt current functioning per pt report.  Processed w/pt increased anxiety and discussed pt self care and coping skills.  Introduced pt to heart math technique and practiced w/ pt in session.  Processed w/ pt her response and encouraged use of relaxation practices daily several times a day.  Plan: Return again in 2 weeks.  Diagnosis: Axis I: Post Traumatic Stress Disorder and MDD    Axis II: No diagnosis    [name],[name], [place] 11/30/2012

## 2012-12-04 ENCOUNTER — Encounter: Payer: Self-pay | Admitting: Internal Medicine

## 2012-12-07 ENCOUNTER — Ambulatory Visit (INDEPENDENT_AMBULATORY_CARE_PROVIDER_SITE_OTHER): Payer: Medicare Other

## 2012-12-07 DIAGNOSIS — J309 Allergic rhinitis, unspecified: Secondary | ICD-10-CM

## 2012-12-14 ENCOUNTER — Ambulatory Visit (INDEPENDENT_AMBULATORY_CARE_PROVIDER_SITE_OTHER): Payer: Medicare Other | Admitting: Physician Assistant

## 2012-12-14 ENCOUNTER — Ambulatory Visit (INDEPENDENT_AMBULATORY_CARE_PROVIDER_SITE_OTHER): Payer: Medicare Other

## 2012-12-14 ENCOUNTER — Ambulatory Visit (INDEPENDENT_AMBULATORY_CARE_PROVIDER_SITE_OTHER): Payer: Medicare Other | Admitting: Psychology

## 2012-12-14 ENCOUNTER — Encounter: Payer: Self-pay | Admitting: Physician Assistant

## 2012-12-14 ENCOUNTER — Encounter: Payer: Self-pay | Admitting: *Deleted

## 2012-12-14 ENCOUNTER — Encounter: Payer: Self-pay | Admitting: Cardiovascular Disease

## 2012-12-14 VITALS — BP 110/70 | HR 88 | Ht 62.0 in | Wt 307.3 lb

## 2012-12-14 DIAGNOSIS — I10 Essential (primary) hypertension: Secondary | ICD-10-CM

## 2012-12-14 DIAGNOSIS — J309 Allergic rhinitis, unspecified: Secondary | ICD-10-CM

## 2012-12-14 DIAGNOSIS — F331 Major depressive disorder, recurrent, moderate: Secondary | ICD-10-CM

## 2012-12-14 DIAGNOSIS — R079 Chest pain, unspecified: Secondary | ICD-10-CM

## 2012-12-14 DIAGNOSIS — R0789 Other chest pain: Secondary | ICD-10-CM | POA: Insufficient documentation

## 2012-12-14 DIAGNOSIS — F4310 Post-traumatic stress disorder, unspecified: Secondary | ICD-10-CM

## 2012-12-14 MED ORDER — NITROGLYCERIN 0.4 MG SL SUBL: 0.4000 mg | SUBLINGUAL_TABLET | SUBLINGUAL | Status: DC | PRN

## 2012-12-14 NOTE — Patient Instructions (Addendum)
Your physician has requested that you have a lexiscan myoview. For further information please visit [email or web address]. Please follow instruction sheet, as given.  Your physician recommends that you schedule a follow-up appointment in i month after [facility]

## 2012-12-14 NOTE — Assessment & Plan Note (Signed)
Well-controlled at this time on appropriate medications.

## 2012-12-14 NOTE — Progress Notes (Signed)
Date:  12/14/2012   ID:  Michele [name], DOB [birth year], MRN [id number]  PCP:  Lucilla Edin, MD  Primary Cardiologist:  Allyson Sabal   History of Present Illness: Michele [name] is a 62 y.o. female was morbidly obese and has a history of coronary disease status post stent to the proximal RCA in 2008. Her history also includes diabetes mellitus type 2, hypertension, sleep apnea, asthma, paroxysmal intrafibrillation, hyperlipidemia, arthritis. Her last nuclear stress test was February 2012 post-rest ejection fraction 75% with no ischemic changes. Her last 2-D echocardiogram was July 2009 showed ejection fraction greater than 55% trace mitral and tricuspid regurgitation otherwise normal.  Patient presents today with chest pain which are brief.   She describes as an ache and is 4/10 in intensity. She states that walking makes the pain worse and rest makes it better. She also reports some nausea, shortness of breath and infrequent and dizziness. She denies diaphoresis lower extremity edema vomiting fever orthopnea PND. She also reports she has a large mass in backside of her thigh.  Wt Readings from Last 3 Encounters:  12/14/12 307 lb 4.8 oz (139.39 kg)  10/31/12 297 lb (134.718 kg)  10/12/12 313 lb 3.2 oz (142.067 kg)     Past Medical History  Diagnosis Date  . Allergic rhinitis   . Asthma   . CAD (coronary artery disease)   . Hypertension   . Sleep apnea   . Diabetes mellitus type II   . Depression   . PTSD (post-traumatic stress disorder)   . 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 Carpal tunnel syndrome, right   . Hyperlipidemia   . Paroxysmal a-fib   . CAD (coronary artery disease) 01/30/2007    stents trivial coronary artery disease diffusely, with a recent deployment od a intracoronary artery stent, 3.5 x 12 mm driver stent with no more than 20-30% in- stents restenosis.done by Dr Lavonne Chick with re-look on novenber 10 2008 revealing a widely patent stent with otherwise  trival CAD and normal LV function  . Chest pain 07/17/12009    2 D Echo EF >55%    Current Outpatient Prescriptions  Medication Sig Dispense Refill  . albuterol (PROVENTIL HFA) 108 (90 BASE) MCG/ACT inhaler Inhale 2 puffs into the lungs every 6 (six) hours as needed.  1 Inhaler  5  . albuterol (PROVENTIL) (2.5 MG/3ML) 0.083% nebulizer solution Take 3 mLs (2.5 mg total) by nebulization every 6 (six) hours as needed.  360 mL  2  . ARIPiprazole (ABILIFY) 15 MG tablet Take 1 tablet (15 mg total) by mouth daily.  30 tablet  2  . aspirin 81 MG tablet Take 81 mg by mouth daily.        . baclofen (LIORESAL) 10 MG tablet TAKE ONE (1) TABLET BY MOUTH 3 TIMES DAILY  90 each  0  . benztropine (COGENTIN) 0.5 MG tablet Take 1 tablet (0.5 mg total) by mouth at bedtime.  30 tablet  2  . diltiazem (CARDIZEM CD) 240 MG 24 hr capsule Take 240 mg by mouth daily.        . fexofenadine (ALLEGRA) 60 MG tablet Take 1 tablet (60 mg total) by mouth daily. Take 60 mg by mouth daily.  30 tablet  11  . Fluticasone-Salmeterol (ADVAIR DISKUS) 250-50 MCG/DOSE AEPB Inhale 1 puff into the lungs every 12 (twelve) hours.  60 each  2  . furosemide (LASIX) 40 MG tablet TAKE ONE (1) TABLET BY MOUTH EVERY  DAY  30 tablet  4  . HYDROcodone-acetaminophen (NORCO) 5-325 MG per tablet Take 1 tablet by mouth every 6 (six) hours as needed.        . isosorbide dinitrate (ISORDIL) 30 MG tablet Take 1 by mouth daily       . metFORMIN (GLUCOPHAGE-XR) 500 MG 24 hr tablet TAKE ONE (1) TABLET BY MOUTH EVERY DAY  90 tablet  0  . metoprolol (TOPROL-XL) 100 MG 24 hr tablet Take 100 mg by mouth daily.        . mometasone (NASONEX) 50 MCG/ACT nasal spray Place 2 sprays into the nose daily.  17 g  2  . morphine (MSIR) 15 MG tablet Take 1 by mouth at bedtime        . nitroGLYCERIN (NITROSTAT) 0.4 MG SL tablet Place 1 tablet (0.4 mg total) under the tongue every 5 (five) minutes as needed.  25 tablet  3  . pantoprazole (PROTONIX) 20 MG tablet Take 20 mg  by mouth daily.      . potassium chloride (KLOR-CON) 20 MEQ packet Take 1 by mouth daily       . rosuvastatin (CRESTOR) 10 MG tablet TAKE ONE (1) TABLET BY MOUTH EVERY OTHER DAY  90 tablet  1  . SANCTURA XR 60 MG CP24 Take 1 capsule by mouth daily.      . sertraline (ZOLOFT) 100 MG tablet Take 2 by mouth once daily  60 tablet  2  . valsartan-hydrochlorothiazide (DIOVAN-HCT) 160-12.5 MG per tablet Take 1 tablet by mouth daily.        Marland Kitchen warfarin (COUMADIN) 7.5 MG tablet Take as directed by Dr. Gery Pray.       No current facility-administered medications for this visit.    Allergies:    Allergies  Allergen Reactions  . Meprobamate Nausea And Vomiting    Social History:  The patient  reports that she has never smoked. She has never used smokeless tobacco. She reports that she does not drink alcohol or use illicit drugs.   ROS:  Please see the history of present illness.  All other systems reviewed and negative.   PHYSICAL EXAM: VS:  BP 110/70  Pulse 88  Ht 5\' 2"  (1.575 m)  Wt 307 lb 4.8 oz (139.39 kg)  BMI 56.19 kg/m2 Morbidly obese, well developed, in no acute distress HEENT: Pupils are equal round react to light accommodation extraocular movements are intact.  Neck: No cervical lymphadenopathy. Cardiac: Regular rate and rhythm without murmurs rubs or gallops. Lungs:  clear to auscultation bilaterally, no wheezing, rhonchi or rales Abd: Obese nontender, positive bowel sounds all quadrants,  Ext: no lower extremity edema.  2+ radial and dorsalis pedis pulses. Skin: warm and dry, patient is on a palpable mass about the size of a breakthrough the medial posterior aspect of her left thigh. Neuro:  Grossly normal, strength 3/5 and equal in upper and lower extremities.  EKG:  Normal sinus rhythm nonspecific T wave changes inferior laterally rate 75 beats per minute     ASSESSMENT AND PLAN:  Problem List Items Addressed This Visit   OBESITY, MORBID: PMI 20     Patient will be set up for  nutritional consult.    HYPERTENSION     Well-controlled at this time on appropriate medications.    Relevant Medications      nitroGLYCERIN (NITROSTAT) SL tablet   Chest pain on exertion     Patient is scheduled for LexiScan Myoview. EKG shows nonspecific ST changes laterally  and inferiorly     Other Visit Diagnoses   Chest pain    -  Primary    Relevant Orders       EKG 12-Lead       Myocardial Perfusion Imaging

## 2012-12-14 NOTE — Assessment & Plan Note (Signed)
Patient will be set up for nutritional consult.

## 2012-12-14 NOTE — Assessment & Plan Note (Signed)
Patient is scheduled for LexiScan Myoview. EKG shows nonspecific ST changes laterally and inferiorly

## 2012-12-15 NOTE — Progress Notes (Signed)
   THERAPIST PROGRESS NOTE  Session Time: 1.35pm-2.35pm  Participation Level: Active  Behavioral Response: Well GroomedAlertDepressed  Type of Therapy: Individual Therapy  Treatment Goals addressed: Diagnosis: PTSD, MDD and goal 1.   Interventions: CBT and Supportive  Summary: Michele [name] is a 62 y.o. female who presents with report of experiencing pain, fatigue and some depressed and loneliness.  Pt reported that she does feels discouraged by how pain limits her functioning.  Pt discussed how she is reframing to not blame self and not use negative self talk. Pt also discussed feeling alone and recognizing want for more contact- but feeling barriers w/ trust issues.  Pt was able to identify that she does have opportunities for social interactions that she does feel safe and secure w/ and to foster these.   Suicidal/Homicidal: Nowithout intent/plan  Therapist Response: ASsessed pt current functioning per pt report. Processed w/pt role of pain in depression and how to reframe so not blaming self and putting self down.  Discussed pt social interactions, trust and how related to past trauma.  Assisted pt in identifying safe social interactions and opportunities and encouraging these.   Plan: Return again in 2 weeks.  Diagnosis: Axis I: Post Traumatic Stress Disorder and MDD    Axis II: No diagnosis    Izabela Ow, LPC 12/15/2012

## 2012-12-19 ENCOUNTER — Other Ambulatory Visit: Payer: Self-pay | Admitting: Radiology

## 2012-12-19 MED ORDER — METFORMIN HCL ER 500 MG PO TB24: 500.0000 mg | ORAL_TABLET | Freq: Every day | ORAL | Status: DC

## 2012-12-19 NOTE — Telephone Encounter (Signed)
Sent in metformin 

## 2012-12-20 ENCOUNTER — Ambulatory Visit (HOSPITAL_COMMUNITY)
Admission: RE | Admit: 2012-12-20 | Discharge: 2012-12-20 | Disposition: A | Discharge status: Home / Self Care | Payer: Medicare Other | Source: Ambulatory Visit | Attending: Cardiovascular Disease | Admitting: Cardiovascular Disease

## 2012-12-20 DIAGNOSIS — R0989 Other specified symptoms and signs involving the circulatory and respiratory systems: Secondary | ICD-10-CM | POA: Insufficient documentation

## 2012-12-20 DIAGNOSIS — R0602 Shortness of breath: Secondary | ICD-10-CM | POA: Insufficient documentation

## 2012-12-20 DIAGNOSIS — R5381 Other malaise: Secondary | ICD-10-CM | POA: Insufficient documentation

## 2012-12-20 DIAGNOSIS — R5383 Other fatigue: Secondary | ICD-10-CM | POA: Insufficient documentation

## 2012-12-20 DIAGNOSIS — R0609 Other forms of dyspnea: Secondary | ICD-10-CM | POA: Insufficient documentation

## 2012-12-20 DIAGNOSIS — R42 Dizziness and giddiness: Secondary | ICD-10-CM | POA: Insufficient documentation

## 2012-12-20 DIAGNOSIS — R079 Chest pain, unspecified: Secondary | ICD-10-CM | POA: Insufficient documentation

## 2012-12-20 DIAGNOSIS — R002 Palpitations: Secondary | ICD-10-CM | POA: Insufficient documentation

## 2012-12-20 MED ORDER — TECHNETIUM TC 99M SESTAMIBI GENERIC - CARDIOLITE
38.0000 | Freq: Once | INTRAVENOUS | Status: AC | PRN
  Administered 2012-12-20: 38 via INTRAVENOUS

## 2012-12-20 MED ORDER — REGADENOSON 0.4 MG/5ML IV SOLN
0.4000 mg | Freq: Once | INTRAVENOUS | Status: AC
  Administered 2012-12-20: 0.4 mg via INTRAVENOUS

## 2012-12-20 MED ORDER — TECHNETIUM TC 99M SESTAMIBI GENERIC - CARDIOLITE
12.2000 | Freq: Once | INTRAVENOUS | Status: AC | PRN
  Administered 2012-12-20: 12 via INTRAVENOUS

## 2012-12-20 NOTE — Procedures (Addendum)
Michele [name] [id number] [phone]  Cardiology Nuclear Med Study  Michele [name] is a 62 y.o. female     MRN : [id number]     DOB: 11/08/1950  Procedure Date: 12/20/2012  Nuclear Med Background Indication for Stress Test:  Evaluation for Ischemia, Stent Patency, PTCA Patency and Abnormal EKG History:  Asthma and CAD;PSTENT/PTCA--01/30/2007;PAF Cardiac Risk Factors: Family History - CAD, Hypertension, Lipids, NIDDM, Obesity, PVD and TIA  Symptoms:  Chest Pain, Dizziness, DOE, Fatigue, Light-Headedness, Palpitations and SOB   Nuclear Pre-Procedure Caffeine/Decaff Intake:  10:00pm NPO After: 8:00am   IV Site: R Forearm  IV 0.9% NS with Angio Cath:  22g  Chest Size (in):  N/A IV Started by: Azucena Cecil, RN  Height: 5' 2"  (1.575 m)  Cup Size: DD  BMI:  Body mass index is 56.14 kg/(m^2). Weight:  307 lb (139.254 kg)   Tech Comments:  N/A    Nuclear Med Study 1 or 2 day study: 1 day  Stress Test Type:  [place] Provider:  Quay Burow, MD    Resting Radionuclide: Technetium 48mSestamibi  Resting Radionuclide Dose: 12.2 mCi   Stress Radionuclide:  Technetium 960mestamibi  Stress Radionuclide Dose: 38.0 mCi           Stress Protocol Rest HR: 61 Stress HR: 86  Rest BP: 134/100 Stress BP: 143/81  Exercise Time (min): n/a METS: n/a   Predicted Max HR: 159 bpm % Max HR: 54.09 bpm Rate Pressure Product: 13158  Dose of Adenosine (mg):  n/a Dose of Lexiscan: 0.4 mg  Dose of Atropine (mg): n/a Dose of Dobutamine: n/a mcg/kg/min (at max HR)  Stress Test Technologist: TeLeane ParaCCT Nuclear Technologist: RoOtho PerlCNMT   Rest Procedure:  Myocardial perfusion imaging was performed at rest 45 minutes following the intravenous administration of Technetium 9956mstamibi. Stress Procedure:  The patient received IV Lexiscan 0.4 mg over 15-seconds.  Technetium 45m62mstamibi injected at 30-seconds.  There were no significant changes with Lexiscan.  Quantitative spect images were obtained after a 45 minute delay.  Transient Ischemic Dilatation (Normal <1.22):  0.74 Lung/Heart Ratio (Normal <0.45):  0.24 QGS EDV:  88 ml QGS ESV:  27 ml LV Ejection Fraction: 69%     Rest ECG: NSR with non-specific ST-T wave changes  Stress ECG: No significant change from baseline ECG  QPS Raw Data Images:  There is interference from nuclear activity from structures below the diaphragm. This does not affect the ability to read the study. Stress Images:  Normal homogeneous uptake in all areas of the myocardium. Rest Images:  Normal homogeneous uptake in all areas of the myocardium. Subtraction (SDS):  No evidence of ischemia.  Impression Exercise Capacity:  Lexiscan with no exercise. BP Response:  Hypertensive blood pressure response. Clinical Symptoms:  No significant symptoms noted. ECG Impression:  No significant ST segment change suggestive of ischemia. Comparison with Prior Nuclear Study: No significant change from previous study  Overall Impression:  Normal stress nuclear study.  LV Wall Motion:  NL LV Function; NL Wall Motion   Michele Heyde, MD  12/20/2012 1:47 PM

## 2012-12-21 ENCOUNTER — Ambulatory Visit (INDEPENDENT_AMBULATORY_CARE_PROVIDER_SITE_OTHER): Payer: Medicare Other

## 2012-12-21 DIAGNOSIS — J309 Allergic rhinitis, unspecified: Secondary | ICD-10-CM

## 2012-12-25 ENCOUNTER — Telehealth: Payer: Self-pay | Admitting: Cardiovascular Disease

## 2012-12-25 NOTE — Telephone Encounter (Signed)
Lm with results

## 2012-12-25 NOTE — Telephone Encounter (Signed)
Returning your call. °

## 2012-12-27 ENCOUNTER — Ambulatory Visit (HOSPITAL_COMMUNITY): Payer: Self-pay | Admitting: Psychiatry

## 2012-12-28 ENCOUNTER — Ambulatory Visit (INDEPENDENT_AMBULATORY_CARE_PROVIDER_SITE_OTHER): Payer: Medicare Other | Admitting: Pharmacist Clinician (PhC)/ Clinical Pharmacy Specialist

## 2012-12-28 ENCOUNTER — Ambulatory Visit (INDEPENDENT_AMBULATORY_CARE_PROVIDER_SITE_OTHER): Payer: Medicare Other

## 2012-12-28 ENCOUNTER — Ambulatory Visit (INDEPENDENT_AMBULATORY_CARE_PROVIDER_SITE_OTHER): Payer: Medicare Other | Admitting: Psychology

## 2012-12-28 VITALS — BP 130/70 | HR 68

## 2012-12-28 DIAGNOSIS — I4891 Unspecified atrial fibrillation: Secondary | ICD-10-CM

## 2012-12-28 DIAGNOSIS — Q211 Atrial septal defect: Secondary | ICD-10-CM

## 2012-12-28 DIAGNOSIS — J309 Allergic rhinitis, unspecified: Secondary | ICD-10-CM

## 2012-12-28 DIAGNOSIS — F331 Major depressive disorder, recurrent, moderate: Secondary | ICD-10-CM

## 2012-12-28 DIAGNOSIS — Z7901 Long term (current) use of anticoagulants: Secondary | ICD-10-CM

## 2012-12-28 DIAGNOSIS — I480 Paroxysmal atrial fibrillation: Secondary | ICD-10-CM

## 2012-12-28 NOTE — Progress Notes (Signed)
   THERAPIST PROGRESS NOTE  Session Time: 12.30pm-1:15pm  Participation Level: Active  Behavioral Response: Well GroomedAlert, Congruent w/ report of fatigue  Type of Therapy: Individual Therapy  Treatment Goals addressed: Diagnosis: MDD, PTSD.  Interventions: Strength-based and Supportive  Summary: Michele [name] is a 62 y.o. female who presents with report of fatigue. Pt reports she has been going to various appointments since 10am and feeling tired.  Pt initially seems quiet and less engaged.  Pt reported not feeling like getting out some days due to pain, not depression.  Pt reports feeling at times anxious since no longer taking the Vistaril.  Pt reported some anxiety about mild chest pain last week and worry that stint might eventually give out.  She reported reflief w/ stress test w/ good results and doctors giving appropriate education.  Pt did identify positives for self w/ volunteering at [facility] and seeing a nutritionist recently.   Suicidal/Homicidal: Nowithout intent/plan  Therapist Response: Assessed pt current functioning per pt report.  Reflected to pt less engaged and explored.  Processed w/pt anxiety.  Assisted pt in identifying positive self care over past week and plans moving forward.  Plan: Return again in 2 weeks. Pt to f/u w/ Cleophas Dunker during counselor's maternity leave- pt to schedule w/ her at end of July- beginning of Aug.  Diagnosis: Axis I: Major Depression, Recurrent and Post Traumatic Stress Disorder    Axis II: No diagnosis    Dominyck Reser, LPC 12/28/2012

## 2013-01-02 ENCOUNTER — Encounter (HOSPITAL_COMMUNITY): Payer: Self-pay | Admitting: Psychiatry

## 2013-01-02 ENCOUNTER — Ambulatory Visit (INDEPENDENT_AMBULATORY_CARE_PROVIDER_SITE_OTHER): Payer: Medicare Other | Admitting: Psychiatry

## 2013-01-02 VITALS — BP 142/93 | HR 70 | Wt 308.0 lb

## 2013-01-02 DIAGNOSIS — F331 Major depressive disorder, recurrent, moderate: Secondary | ICD-10-CM

## 2013-01-02 DIAGNOSIS — F323 Major depressive disorder, single episode, severe with psychotic features: Secondary | ICD-10-CM

## 2013-01-02 DIAGNOSIS — F4310 Post-traumatic stress disorder, unspecified: Secondary | ICD-10-CM

## 2013-01-02 MED ORDER — SERTRALINE HCL 100 MG PO TABS: ORAL_TABLET | ORAL | Status: DC

## 2013-01-02 MED ORDER — BENZTROPINE MESYLATE 0.5 MG PO TABS: 0.5000 mg | ORAL_TABLET | Freq: Every day | ORAL | Status: DC

## 2013-01-02 MED ORDER — ARIPIPRAZOLE 15 MG PO TABS: 15.0000 mg | ORAL_TABLET | Freq: Every day | ORAL | Status: DC
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
## 2013-01-03 ENCOUNTER — Other Ambulatory Visit: Payer: Self-pay | Admitting: Physician Assistant

## 2013-01-04 ENCOUNTER — Ambulatory Visit: Payer: Self-pay

## 2013-01-08 ENCOUNTER — Encounter: Payer: Self-pay | Admitting: Cardiovascular Disease

## 2013-01-08 ENCOUNTER — Ambulatory Visit (INDEPENDENT_AMBULATORY_CARE_PROVIDER_SITE_OTHER): Payer: Medicare Other | Admitting: Cardiovascular Disease

## 2013-01-08 VITALS — BP 142/90 | HR 72 | Ht 62.0 in | Wt 309.1 lb

## 2013-01-08 DIAGNOSIS — R079 Chest pain, unspecified: Secondary | ICD-10-CM

## 2013-01-08 NOTE — Patient Instructions (Addendum)
Your physician wants you to follow-up in:  6 months. You will receive a reminder letter in the mail two months in advance. If you don't receive a letter, please call our office to schedule the follow-up appointment.   

## 2013-01-08 NOTE — Assessment & Plan Note (Signed)
Patient was seen by Tenny Craw [facility] approximately 3 weeks ago with complaints of chest pain. She does have a history of CAD status post RCA stenting by Dr. [name] 01/30/07 with relook 05/29/07 revealing a widely patent stent and otherwise trivial CAD. A Myoview stress test recently performed was entirely normal. She's had no recurrent chest pain.

## 2013-01-08 NOTE — Progress Notes (Signed)
01/08/2013 Michele [name]   1951/06/14  [id number]  Primary Physician [name], MD Primary Cardiologist: Lorretta Harp MD Renae Gloss  HPI:  Michele [name] is a 62 y.o. female was morbidly obese and has a history of coronary disease status post stent to the proximal RCA in 2008. Her history also includes diabetes mellitus type 2, hypertension, sleep apnea, asthma, paroxysmal intrafibrillation, hyperlipidemia, arthritis. Her last nuclear stress test was February 2012 post-rest ejection fraction 75% with no ischemic changes. Her last 2-D echocardiogram was July 2009 showed ejection fraction greater than 55% trace mitral and tricuspid regurgitation otherwise normal.  Patient presents today with chest pain which are brief. She describes as an ache and is 4/10 in intensity. She states that walking makes the pain worse and rest makes it better. She also reports some nausea, shortness of breath and infrequent and dizziness. She denies diaphoresis lower extremity edema vomiting fever orthopnea PND. She also reports she has a large mass in backside of her thigh. Since she was seen last by Tenny Craw, PA-C she underwent a Myoview stress test which was entirely normal. She's had no recurrent symptoms.    Current Outpatient Prescriptions  Medication Sig Dispense Refill  . albuterol (PROVENTIL HFA) 108 (90 BASE) MCG/ACT inhaler Inhale 2 puffs into the lungs every 6 (six) hours as needed.  1 Inhaler  5  . albuterol (PROVENTIL) (2.5 MG/3ML) 0.083% nebulizer solution Take 3 mLs (2.5 mg total) by nebulization every 6 (six) hours as needed.  360 mL  2  . ARIPiprazole (ABILIFY) 15 MG tablet Take 1 tablet (15 mg total) by mouth daily.  30 tablet  2  . aspirin 81 MG tablet Take 81 mg by mouth daily.        . baclofen (LIORESAL) 10 MG tablet TAKE ONE (1) TABLET BY MOUTH 3 TIMES DAILY  90 each  0  . benztropine (COGENTIN) 0.5 MG tablet Take 1 tablet (0.5 mg total) by mouth at bedtime.  30  tablet  2  . diltiazem (CARDIZEM CD) 240 MG 24 hr capsule Take 240 mg by mouth daily.        . fexofenadine (ALLEGRA) 60 MG tablet Take 1 tablet (60 mg total) by mouth daily. Take 60 mg by mouth daily.  30 tablet  11  . Fluticasone-Salmeterol (ADVAIR DISKUS) 250-50 MCG/DOSE AEPB Inhale 1 puff into the lungs every 12 (twelve) hours.  60 each  2  . furosemide (LASIX) 40 MG tablet TAKE ONE (1) TABLET BY MOUTH EVERY DAY  30 tablet  4  . HYDROcodone-acetaminophen (NORCO) 5-325 MG per tablet Take 1 tablet by mouth every 6 (six) hours as needed.        . isosorbide dinitrate (ISORDIL) 30 MG tablet Take 1 by mouth daily       . metFORMIN (GLUCOPHAGE-XR) 500 MG 24 hr tablet Take 1 tablet (500 mg total) by mouth daily with breakfast.  90 tablet  1  . metoprolol (TOPROL-XL) 100 MG 24 hr tablet Take 100 mg by mouth daily.        . mometasone (NASONEX) 50 MCG/ACT nasal spray Place 2 sprays into the nose daily.  17 g  2  . morphine (MSIR) 15 MG tablet Take 1 by mouth at bedtime        . nitroGLYCERIN (NITROSTAT) 0.4 MG SL tablet Place 1 tablet (0.4 mg total) under the tongue every 5 (five) minutes as needed.  25 tablet  3  . pantoprazole (PROTONIX)  20 MG tablet Take 20 mg by mouth daily.      . potassium chloride (KLOR-CON) 20 MEQ packet Take 1 by mouth daily       . rosuvastatin (CRESTOR) 10 MG tablet TAKE ONE (1) TABLET BY MOUTH EVERY OTHER DAY  90 tablet  1  . SANCTURA XR 60 MG CP24 Take 1 capsule by mouth daily.      . sertraline (ZOLOFT) 100 MG tablet Take 2 by mouth once daily  60 tablet  2  . valsartan-hydrochlorothiazide (DIOVAN-HCT) 160-12.5 MG per tablet Take 1 tablet by mouth daily.        Marland Kitchen warfarin (COUMADIN) 7.5 MG tablet Take as directed by Dr. Alvester Chou.       No current facility-administered medications for this visit.    Allergies  Allergen Reactions  . Meprobamate Nausea And Vomiting    History   Social History  . Marital Status: Divorced    Spouse Name: N/A    Number of Children:  2  . Years of Education: N/A   Occupational History  . [employer]    Social History Main Topics  . Smoking status: Never Smoker   . Smokeless tobacco: Never Used  . Alcohol Use: No  . Drug Use: No  . Sexually Active: Not on file   Other Topics Concern  . Not on file   Social History Narrative  . No narrative on file     Review of Systems: General: negative for chills, fever, night sweats or weight changes.  Cardiovascular: negative for chest pain, dyspnea on exertion, edema, orthopnea, palpitations, paroxysmal nocturnal dyspnea or shortness of breath Dermatological: negative for rash Respiratory: negative for cough or wheezing Urologic: negative for hematuria Abdominal: negative for nausea, vomiting, diarrhea, bright red blood per rectum, melena, or hematemesis Neurologic: negative for visual changes, syncope, or dizziness All other systems reviewed and are otherwise negative except as noted above.    Blood pressure 142/90, pulse 72, height 5' 2"  (1.575 m), weight 309 lb 1.6 oz (140.207 kg).  General appearance: alert and no distress Neck: no adenopathy, no carotid bruit, no JVD, supple, symmetrical, trachea midline and thyroid not enlarged, symmetric, no tenderness/mass/nodules Lungs: clear to auscultation bilaterally Heart: regular rate and rhythm, S1, S2 normal, no murmur, click, rub or gallop Extremities: extremities normal, atraumatic, no cyanosis or edema  EKG not performed today  ASSESSMENT AND PLAN:   Chest pain on exertion Patient was seen by Tenny Craw [facility] approximately 3 weeks ago with complaints of chest pain. She does have a history of CAD status post RCA stenting by Dr. [name] 01/30/07 with relook 05/29/07 revealing a widely patent stent and otherwise trivial CAD. A Myoview stress test recently performed was entirely normal. She's had no recurrent chest pain.      Lorretta Harp MD FACP,FACC,FAHA, [facility] 01/08/2013 12:18 PM

## 2013-01-12 ENCOUNTER — Ambulatory Visit (INDEPENDENT_AMBULATORY_CARE_PROVIDER_SITE_OTHER): Payer: Medicare Other

## 2013-01-12 DIAGNOSIS — J309 Allergic rhinitis, unspecified: Secondary | ICD-10-CM

## 2013-01-18 ENCOUNTER — Ambulatory Visit (HOSPITAL_COMMUNITY): Payer: Self-pay | Admitting: Psychology

## 2013-01-25 ENCOUNTER — Ambulatory Visit: Payer: Self-pay | Admitting: Pharmacist Clinician (PhC)/ Clinical Pharmacy Specialist

## 2013-02-01 ENCOUNTER — Ambulatory Visit (INDEPENDENT_AMBULATORY_CARE_PROVIDER_SITE_OTHER): Payer: Medicare Other | Admitting: Pharmacist Clinician (PhC)/ Clinical Pharmacy Specialist

## 2013-02-01 ENCOUNTER — Ambulatory Visit (INDEPENDENT_AMBULATORY_CARE_PROVIDER_SITE_OTHER): Payer: Medicare Other

## 2013-02-01 ENCOUNTER — Ambulatory Visit (INDEPENDENT_AMBULATORY_CARE_PROVIDER_SITE_OTHER): Payer: No Typology Code available for payment source | Admitting: Marriage and Family Therapist

## 2013-02-01 ENCOUNTER — Ambulatory Visit (HOSPITAL_COMMUNITY): Payer: Self-pay | Admitting: Psychology

## 2013-02-01 VITALS — BP 112/78 | HR 72

## 2013-02-01 DIAGNOSIS — I480 Paroxysmal atrial fibrillation: Secondary | ICD-10-CM

## 2013-02-01 DIAGNOSIS — F331 Major depressive disorder, recurrent, moderate: Secondary | ICD-10-CM

## 2013-02-01 DIAGNOSIS — I4891 Unspecified atrial fibrillation: Secondary | ICD-10-CM

## 2013-02-01 DIAGNOSIS — J309 Allergic rhinitis, unspecified: Secondary | ICD-10-CM

## 2013-02-01 DIAGNOSIS — Z7901 Long term (current) use of anticoagulants: Secondary | ICD-10-CM

## 2013-02-01 NOTE — Progress Notes (Signed)
   THERAPIST PROGRESS NOTE  Session Time:  1:00 - 2:00 p.m.  Participation Level: Active  Behavioral Response: CasualAlertDepressed  (mild)  Type of Therapy: Individual Therapy  Treatment Goals addressed: Coping  Interventions: Strength-based and Supportive  Summary: AGATHA DUPLECHAIN is a 62 y.o. female who presents with depression.  She was referred by Forde Radon, counselor.  Ms. Ophelia Charter is on maternity leave and patient will return to this counselor after her maternity leave.  Patient first reported that she has been waking herself up by having dreams where she starts yelling.  She states she does not know why she has been doing this lately.  She admits to having all kinds of trouble sleeping.  She also says she has vivid dreams, some about her family (dead aunts, her mother) and at times work (that are not pleasant).  She reports missing her mother and the dreams about work are about her feeling helpless.  She reports sleeping with her mother's blanket which helps her feel closer to her.  She also talked about having six brothers, five that are dead.  She reports her brothers "always made me feel protected and safe," and since they are no longer alive she feels less so with the exception of when she is with her brother in [place].  Patient did say that since she is taking morphine sulfate at night for her back that it has been helping her fall asleep.  She reports she also uses a CPAP which is helping with her sleep.  She talked about her family and how they are doing.  Patient talked about her own recovery.  She reports [facility] three [facility] but "I can do better at this than I'm doing."  She reports also seeing a dietitian which is helping her with nutrition and her weight however recently found out her insurance will not pay for this service so she will have to stop.  She reports this was helping her so she is disappointed.  She also states she  continues to volunteer helping disabled veterans.  Suicidal/Homicidal: Nowithout intent/plan  Therapist Response:  First asked patient what she wanted to work on until her therapist returns from [place] leave.  Patient was not clear about this question however did want to talk about her difficulties around sleeping.  Assessed patient's overall situation and current level of depression and patient reports her depression has been "up and down" but at the moment was stable.  Commended her on being active at the moment with volunteering and self-care.  Will continue to work with patient supporting her in being active with her self-care and when appropriate will give other suggestions as well.  Will continue to monitor depression.  Plan: Return again in 2 weeks.  Diagnosis: Axis I: Major depressive d/o, moderate    Axis II: Deferred    Kadence Mikkelson, LMFT , CTS  02/01/2013

## 2013-02-08 ENCOUNTER — Ambulatory Visit: Payer: Self-pay

## 2013-02-09 ENCOUNTER — Ambulatory Visit: Payer: Self-pay

## 2013-02-13 ENCOUNTER — Ambulatory Visit (INDEPENDENT_AMBULATORY_CARE_PROVIDER_SITE_OTHER): Payer: Medicare Other | Admitting: Emergency Medicine

## 2013-02-13 ENCOUNTER — Encounter: Payer: Self-pay | Admitting: Emergency Medicine

## 2013-02-13 VITALS — BP 134/94 | HR 88 | Temp 98.6°F | Resp 18 | Ht 61.5 in | Wt 307.0 lb

## 2013-02-13 DIAGNOSIS — E119 Type 2 diabetes mellitus without complications: Secondary | ICD-10-CM

## 2013-02-13 DIAGNOSIS — R609 Edema, unspecified: Secondary | ICD-10-CM

## 2013-02-13 DIAGNOSIS — I10 Essential (primary) hypertension: Secondary | ICD-10-CM

## 2013-02-13 DIAGNOSIS — E669 Obesity, unspecified: Secondary | ICD-10-CM

## 2013-02-13 LAB — GLUCOSE, POCT (MANUAL RESULT ENTRY): POC Glucose: 108 mg/dl — AB (ref 70–99)

## 2013-02-13 LAB — POCT GLYCOSYLATED HEMOGLOBIN (HGB A1C): Hemoglobin A1C: 6

## 2013-02-13 MED ORDER — FUROSEMIDE 40 MG PO TABS: ORAL_TABLET | ORAL | Status: DC

## 2013-02-13 NOTE — Progress Notes (Signed)
  Subjective:    Patient ID: Michele [name], female    DOB: 1950-11-29, 62 y.o.   MRN: [id number]  HPI patient in for followup of her diabetes. She has been seeing everyone for followup. She has a cardiologist she has a nutrition special and overall is doing well. She continues to struggle with weight loss. She occasionally has swelling in her ankles and is requesting to have some Lasix to keep on hand when she has a problem    Review of Systems     Objective:   Physical Exam patient is alert and cooperative. Her chest is clear. Her heart is regular without murmurs. Extremities show only trace edema Results for orders placed in visit on 02/13/13  GLUCOSE, POCT (MANUAL RESULT ENTRY)      Result Value Range   POC Glucose 108 (*) 70 - 99 mg/dl  POCT GLYCOSYLATED HEMOGLOBIN (HGB A1C)      Result Value Range   Hemoglobin A1C 6.0           Assessment & Plan:  Diabetes looks great. She continues to need to work on her diet and exercise program

## 2013-02-15 ENCOUNTER — Ambulatory Visit (INDEPENDENT_AMBULATORY_CARE_PROVIDER_SITE_OTHER): Payer: Medicare Other

## 2013-02-15 ENCOUNTER — Ambulatory Visit: Payer: Self-pay

## 2013-02-15 ENCOUNTER — Ambulatory Visit (INDEPENDENT_AMBULATORY_CARE_PROVIDER_SITE_OTHER): Payer: Medicare Other | Admitting: Pharmacist Clinician (PhC)/ Clinical Pharmacy Specialist

## 2013-02-15 ENCOUNTER — Encounter: Payer: Self-pay | Admitting: Internal Medicine

## 2013-02-15 ENCOUNTER — Ambulatory Visit (INDEPENDENT_AMBULATORY_CARE_PROVIDER_SITE_OTHER): Payer: Medicare Other | Admitting: Internal Medicine

## 2013-02-15 VITALS — BP 122/78 | HR 98 | Ht 62.0 in | Wt 311.6 lb

## 2013-02-15 DIAGNOSIS — I4891 Unspecified atrial fibrillation: Secondary | ICD-10-CM

## 2013-02-15 DIAGNOSIS — Z7901 Long term (current) use of anticoagulants: Secondary | ICD-10-CM

## 2013-02-15 DIAGNOSIS — J3089 Other allergic rhinitis: Secondary | ICD-10-CM

## 2013-02-15 DIAGNOSIS — J309 Allergic rhinitis, unspecified: Secondary | ICD-10-CM

## 2013-02-15 DIAGNOSIS — J302 Other seasonal allergic rhinitis: Secondary | ICD-10-CM

## 2013-02-15 DIAGNOSIS — I480 Paroxysmal atrial fibrillation: Secondary | ICD-10-CM

## 2013-02-15 DIAGNOSIS — G4733 Obstructive sleep apnea (adult) (pediatric): Secondary | ICD-10-CM

## 2013-02-15 LAB — POCT INR: INR: 1.7

## 2013-02-15 NOTE — Patient Instructions (Addendum)
We will increase your allergy vaccine strength to 1:10 with your next order  We can continue Xolair  We can continue CPAP and will check with [facility] to see what pressure they have you on.  Please call as needed

## 2013-02-15 NOTE — Progress Notes (Signed)
Patient ID: Michele [name], female    DOB: 1950-08-02, 62 y.o.   MRN: [id number]  HPI 5 yoF never smoker. Followed here for obstructive sleep apnea, allergic rhinitis, asthma, complicated by obesity, GERD and CAD.   10/12/10 - Last here April 13, 2010. She had lost her insurance due to finances. Had trouble getting her meds. We had stopped her allergy shots. Because of overactive bladder she had stopped her CPAP. We discussed use of a quick disconnect at tubing so she could leave the mask on.  Breathing now is clear.   12/03/10 Acute OV  Presents for an acute office visit. Complains of increased SOB, wheezing, PND, prod cough with green mucus, sinus pressure/congestion, bilat ear congestion, f/c/s x2.5weeks. OTC not working. Taking Advair regularly. She now has insurance.  Cough and congestion are getting worse. Has a lot of sinus pain /pressure. Has felt feverish.  Appetite is down. No n/v/d. Sugars are doing okay averaging ~126.   04/05/2011-59 yoF never smoker. Followed here for obstructive sleep apnea, allergic rhinitis, asthma, complicated by obesity, GERD and CAD. She had seen the nurse practitioner in May infection and responded to prednisone with antibiotic at that time. Since then she has gotten steroid injection in her knees for arthritis. She stayed off of CPAP because of her overactive bladder. She has not been able to lose any weight. Treatment alternatives have been reviewed and medical issues of untreated sleep apnea have been explained again.  She wants to restart allergy vaccine here because of nasal congestion postnasal drainage sneezing and itch. She had stopped allergy vaccine before when she lost insurance.  08/10/12- 43 yoF never smoker. Followed here for obstructive sleep apnea, allergic rhinitis, asthma, complicated by obesity, GERD and CAD/ stent/ warfarin. FOLLOW FOR:has CPAP-needs to be checked,not wearing for 1 yr., c/o gets real cold in nose,fits  well. Allergies vaccine 1:50 GH still helps with no problems. Stopped CPAP because the air was too cold on her nose. Snores loudly. Daytime tiredness with yawning. Frequent nocturia.  10/12/12- 73 yoF never smoker. Followed here for obstructive sleep apnea, allergic rhinitis, asthma, complicated by obesity, GERD and CAD/ stent/ warfarin. FOLLOWS [facility] in today for allergy injection-SOB and wheezing-given Xopenex Tx; was given allergy injection already. She doesn't know why she began asthma flare last night. Admits seasonal nasal congestion.  Continues Allergy vaccine 1:50 GH, well tolerated.  She has cleared after neb Xopenex by allergy lab 30 minutes ago. She has not gotten back to regular use of CPAP, saying machine repairs not yet addressed since Huey Romans has not been out to home.  02/15/13- 37 yoF never smoker. Followed here for obstructive sleep apnea, allergic rhinitis, asthma, complicated by obesity, GERD and CAD/ stent/ warfarin. FOLLOWS FOR: still on  Allergy vaccine 1:50 GH and doing well. No reactions. SOB and wheezing at [facility] with activity.Wears CPAP Auto/ [facility] every night from 4-8 hours.  Continues Xolair-helpful.  ROS-see HPI Constitutional:   No-   weight loss, night sweats, fevers, chills, +fatigue, lassitude. HEENT:   No-  headaches, difficulty swallowing, tooth/dental problems, sore throat,       +  sneezing, itching, ear ache, nasal congestion, post nasal drip,  CV:  No-   chest pain, orthopnea, PND, swelling in lower extremities, anasarca, dizziness, palpitations Resp: +shortness of breath with exertion or at rest.              No-   productive cough,  No non-productive cough,  No- coughing up of  blood.              No-   change in color of mucus.  + wheezing.   Skin: No-   rash or lesions. GI:  No-   heartburn, indigestion, abdominal pain, nausea, vomiting,  GU:  MS:  No-   joint pain or swelling.   Neuro-     nothing unusual Psych:  No- change in mood or  affect. No acute  depression or anxiety.  No memory loss.  Objective:  General- Alert, Oriented, Affect-appropriate, Distress- none acute; morbidly obese Skin- rash-none, lesions- none, excoriation- none Lymphadenopathy- none Head- atraumatic            Eyes- Gross vision intact, PERRLA, conjunctivae clear secretions            Ears- Hearing, canals-normal            Nose- +mucus bridging, no- Septal dev,  polyps, erosion, perforation             Throat- Mallampati IV , mucosa clear , drainage- none, tonsils- atrophic; raspy voice Neck- flexible , trachea midline, no stridor , thyroid nl, carotid no bruit Chest - symmetrical excursion , unlabored           Heart/CV- RRR , no murmur , no gallop  , no rub, nl s1 s2                           - JVD- none , edema- none, stasis changes- none, varices- none           Lung- clear, shallow, wheeze- none, cough- none , dullness-none, rub- none           Chest wall-  Abd-  Br/ Gen/ Rectal- Not done, not indicated Extrem- cyanosis- none, clubbing, none, atrophy- none, strength- nl; +walks with cane Neuro- grossly intact to observation  Assessment & Plan:

## 2013-02-22 ENCOUNTER — Ambulatory Visit (INDEPENDENT_AMBULATORY_CARE_PROVIDER_SITE_OTHER): Payer: No Typology Code available for payment source | Admitting: Marriage and Family Therapist

## 2013-02-22 ENCOUNTER — Ambulatory Visit (INDEPENDENT_AMBULATORY_CARE_PROVIDER_SITE_OTHER): Payer: Medicare Other

## 2013-02-22 DIAGNOSIS — J309 Allergic rhinitis, unspecified: Secondary | ICD-10-CM

## 2013-02-22 DIAGNOSIS — F331 Major depressive disorder, recurrent, moderate: Secondary | ICD-10-CM

## 2013-02-22 DIAGNOSIS — F4310 Post-traumatic stress disorder, unspecified: Secondary | ICD-10-CM

## 2013-02-22 NOTE — Progress Notes (Signed)
   THERAPIST PROGRESS NOTE  Session Time:  11:00 - Noon  Participation Level: Active  Behavioral Response: CasualAlertDepressed  Type of Therapy: Individual Therapy  Treatment Goals addressed: Coping  Interventions: Strength-based and Supportive  Summary: Michele [name] is a 62 y.o. female who presents with depression and PTSD.  She was referred by Forde Radon, counselor, who is on maternity leave.  Patient reports she has been thinking about whether she is getting enough out of therapy based on her responsibility and whether or not she is pushing herself relating to what she is doing on the outside to take care of herself.  Patient gave a description of what her life is like on a daily basis taking care of her physical, mental needs, getting to her doctors, etc.  She described it as a "full-time job."  She did say that why she keeps trying to do better and not give up is because she has an excellent support system through her family who are in touch with her at times two to three times a day.  Patient also talked about having memory problems and gave several examples including missing appointments and showing up for wrong appointments.     Suicidal/Homicidal: Nowithout intent/plan  Therapist Response:  Used strength-based therapy to help patient break down her daily life tasks in relationship to patient thinking she is not doing enough to help herself.  Also discussed patient changing her thinking and expectations around how her life has changed due to disabilities.  Discussed patient making goals with the idea that she has good days and bad days and goals can be readjusted accordingly.  Patient said she felt better after the session.  She states she is now able to compartmentalize that she did not have a problem overall doing better but only had one - to find a way to get to her aerobic swimming classes in the morning.  Plan: Return again in 2 weeks.  Diagnosis: Axis I: Major  depressive d/o, moderate; PTSD    Axis II: Deferred    Alfhild Partch, LMFT, CTS 02/22/2013

## 2013-02-25 NOTE — Assessment & Plan Note (Signed)
Good compliance and control. We are going to verify pressure [facility]. I pointed out that weight loss would help a lot

## 2013-02-25 NOTE — Assessment & Plan Note (Signed)
She is tolerating vaccine well. There is room to go up. Plan-next order increase vaccine to 1:10

## 2013-03-01 ENCOUNTER — Ambulatory Visit (INDEPENDENT_AMBULATORY_CARE_PROVIDER_SITE_OTHER): Payer: Medicare Other

## 2013-03-01 ENCOUNTER — Ambulatory Visit (INDEPENDENT_AMBULATORY_CARE_PROVIDER_SITE_OTHER): Payer: Medicare Other | Admitting: Pharmacist Clinician (PhC)/ Clinical Pharmacy Specialist

## 2013-03-01 DIAGNOSIS — J309 Allergic rhinitis, unspecified: Secondary | ICD-10-CM

## 2013-03-01 DIAGNOSIS — I4891 Unspecified atrial fibrillation: Secondary | ICD-10-CM

## 2013-03-01 DIAGNOSIS — I480 Paroxysmal atrial fibrillation: Secondary | ICD-10-CM

## 2013-03-01 DIAGNOSIS — Z7901 Long term (current) use of anticoagulants: Secondary | ICD-10-CM

## 2013-03-08 ENCOUNTER — Ambulatory Visit (INDEPENDENT_AMBULATORY_CARE_PROVIDER_SITE_OTHER): Payer: Medicare Other

## 2013-03-08 ENCOUNTER — Telehealth: Payer: Self-pay

## 2013-03-08 ENCOUNTER — Ambulatory Visit (HOSPITAL_COMMUNITY): Payer: Self-pay | Admitting: Marriage and Family Therapist

## 2013-03-08 DIAGNOSIS — J309 Allergic rhinitis, unspecified: Secondary | ICD-10-CM

## 2013-03-08 NOTE — Telephone Encounter (Signed)
Pt called back and explained that she has not been seeing the specialist any more d/t increased cost and she is stable on this medication. She would like for Dr Cleta Alberts to Rx it for her if possible.

## 2013-03-08 NOTE — Telephone Encounter (Signed)
Pharm reqs RF of trospium chloride (Sanctura) 60 mg caps. I don't know if this is something that Dr Cleta Alberts has Rxd in the past. It looks like pt's urologist, Dr Ihor Gully, has Rxd this most recently. LMOM for pt to CB to clarify if urologist is managing this med.

## 2013-03-12 MED ORDER — TROSPIUM CHLORIDE ER 60 MG PO CP24: 1.0000 | ORAL_CAPSULE | Freq: Every day | ORAL | Status: DC

## 2013-03-12 NOTE — Telephone Encounter (Signed)
rx sent.  Meds ordered this encounter  Medications  . Trospium Chloride (SANCTURA XR) 60 MG CP24    Sig: Take 1 capsule (60 mg total) by mouth daily.    Dispense:  30 each    Refill:  2    Order Specific Question:  Supervising Provider    Answer:  DOOLITTLE, ROBERT P [[id number]]

## 2013-03-15 ENCOUNTER — Ambulatory Visit: Payer: Self-pay

## 2013-03-16 NOTE — Telephone Encounter (Signed)
It is okay for [place] to write this medication. She can have #30 tablets and refills for one year

## 2013-03-22 ENCOUNTER — Ambulatory Visit (INDEPENDENT_AMBULATORY_CARE_PROVIDER_SITE_OTHER): Payer: No Typology Code available for payment source | Admitting: Marriage and Family Therapist

## 2013-03-22 ENCOUNTER — Ambulatory Visit (INDEPENDENT_AMBULATORY_CARE_PROVIDER_SITE_OTHER): Payer: Medicare Other

## 2013-03-22 DIAGNOSIS — F331 Major depressive disorder, recurrent, moderate: Secondary | ICD-10-CM

## 2013-03-22 DIAGNOSIS — J309 Allergic rhinitis, unspecified: Secondary | ICD-10-CM

## 2013-03-22 DIAGNOSIS — F4310 Post-traumatic stress disorder, unspecified: Secondary | ICD-10-CM

## 2013-03-22 NOTE — Progress Notes (Signed)
   THERAPIST PROGRESS NOTE  Session Time:  11:00 - Noon  Participation Level: Active  Behavioral Response: CasualAlertDepressed  Type of Therapy: Individual Therapy  Treatment Goals addressed: Coping  Interventions: Strength-based and Supportive  Summary: Michele [name] is a 62 y.o. female who presents with depression and PTSD.  She was referred by Forde Radon while this counselor is on maternity leave.  Patient reports she continues to have difficulty accepting that she cannot do what she used to do based on her medical conditions and her emotional conditions.  She described having a lot of forgetfulness and remembered how her mother was the last years of her life with forgetfulness.  She states she is afraid this is what is coming for her.  She talked about her sister visiting her from [place] who had a stroke.  Patient has requested to stay with this therapist.  Suicidal/Homicidal: Nowithout intent/plan  Therapist Response:  Processed at length patient's strengths concerning her ability to juggle multiple medical problems, caring for her family, volunteering.  Talked about patient at times having difficulty even getting out of bed and how when she does she needs to commend herself for same.  Discussed how patient and her siblings have been such a great support for patient and what a full life she has managed to have despite her medical and psychological complications.  Told patient this writer's job is to point out for her that she is stronger than she thinks but that at times she can be very hard on herself.  Patient agreed.  When patient requested staying with this therapist, it was the end of the session and therefore we could talk about it in the next session.  Plan: Return again in 2 weeks.  Diagnosis: Axis I:  Major depressive d/o, moderate; PTSD    Axis II: Deferred    Brain Honeycutt, LMFT, CTS 03/22/2013

## 2013-03-29 ENCOUNTER — Ambulatory Visit (INDEPENDENT_AMBULATORY_CARE_PROVIDER_SITE_OTHER): Payer: Medicare Other

## 2013-03-29 ENCOUNTER — Ambulatory Visit (INDEPENDENT_AMBULATORY_CARE_PROVIDER_SITE_OTHER): Payer: Medicare Other | Admitting: Pharmacist Clinician (PhC)/ Clinical Pharmacy Specialist

## 2013-03-29 VITALS — BP 138/80 | HR 72

## 2013-03-29 DIAGNOSIS — J309 Allergic rhinitis, unspecified: Secondary | ICD-10-CM

## 2013-03-29 DIAGNOSIS — I4891 Unspecified atrial fibrillation: Secondary | ICD-10-CM

## 2013-03-29 DIAGNOSIS — I480 Paroxysmal atrial fibrillation: Secondary | ICD-10-CM

## 2013-03-29 DIAGNOSIS — Z7901 Long term (current) use of anticoagulants: Secondary | ICD-10-CM

## 2013-03-29 LAB — POCT INR: INR: 1.4

## 2013-04-04 ENCOUNTER — Encounter (HOSPITAL_COMMUNITY): Payer: Self-pay | Admitting: Psychiatry

## 2013-04-04 ENCOUNTER — Ambulatory Visit (INDEPENDENT_AMBULATORY_CARE_PROVIDER_SITE_OTHER): Payer: No Typology Code available for payment source | Admitting: Psychiatry

## 2013-04-04 VITALS — Wt 307.0 lb

## 2013-04-04 DIAGNOSIS — F329 Major depressive disorder, single episode, unspecified: Secondary | ICD-10-CM

## 2013-04-04 DIAGNOSIS — F331 Major depressive disorder, recurrent, moderate: Secondary | ICD-10-CM

## 2013-04-04 DIAGNOSIS — F4310 Post-traumatic stress disorder, unspecified: Secondary | ICD-10-CM

## 2013-04-04 DIAGNOSIS — F29 Unspecified psychosis not due to a substance or known physiological condition: Secondary | ICD-10-CM

## 2013-04-04 MED ORDER — ARIPIPRAZOLE 15 MG PO TABS: 15.0000 mg | ORAL_TABLET | Freq: Every day | ORAL | Status: DC

## 2013-04-04 MED ORDER — SERTRALINE HCL 100 MG PO TABS: ORAL_TABLET | ORAL | Status: DC

## 2013-04-04 MED ORDER — BENZTROPINE MESYLATE 0.5 MG PO TABS: 0.5000 mg | ORAL_TABLET | Freq: Every day | ORAL | Status: DC
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
## 2013-04-05 ENCOUNTER — Ambulatory Visit (INDEPENDENT_AMBULATORY_CARE_PROVIDER_SITE_OTHER): Payer: Medicare Other

## 2013-04-05 DIAGNOSIS — J309 Allergic rhinitis, unspecified: Secondary | ICD-10-CM

## 2013-04-09 ENCOUNTER — Telehealth: Payer: Self-pay | Admitting: Internal Medicine

## 2013-04-10 ENCOUNTER — Other Ambulatory Visit (HOSPITAL_BASED_OUTPATIENT_CLINIC_OR_DEPARTMENT_OTHER): Payer: Medicare Other

## 2013-04-10 ENCOUNTER — Other Ambulatory Visit: Payer: Self-pay | Admitting: Cardiovascular Disease

## 2013-04-10 DIAGNOSIS — D649 Anemia, unspecified: Secondary | ICD-10-CM

## 2013-04-10 LAB — IRON AND TIBC CHCC
%SAT: 17 % — ABNORMAL LOW (ref 21–57)
Iron: 50 ug/dL (ref 41–142)
TIBC: 298 ug/dL (ref 236–444)
UIBC: 247 ug/dL (ref 120–384)

## 2013-04-10 LAB — CBC WITH DIFFERENTIAL/PLATELET
Basophils Absolute: 0 10*3/uL (ref 0.0–0.1)
Eosinophils Absolute: 0.2 10*3/uL (ref 0.0–0.5)
HGB: 11.2 g/dL — ABNORMAL LOW (ref 11.6–15.9)
MONO#: 0.9 10*3/uL (ref 0.1–0.9)
NEUT#: 3.8 10*3/uL (ref 1.5–6.5)
RBC: 5.29 10*6/uL (ref 3.70–5.45)
RDW: 18 % — ABNORMAL HIGH (ref 11.2–14.5)
WBC: 8.9 10*3/uL (ref 3.9–10.3)

## 2013-04-10 LAB — FERRITIN CHCC: Ferritin: 215 ng/ml (ref 9–269)

## 2013-04-10 NOTE — Telephone Encounter (Signed)
Rx was sent to pharmacy electronically. 

## 2013-04-12 ENCOUNTER — Other Ambulatory Visit: Payer: Self-pay | Admitting: Internal Medicine

## 2013-04-12 ENCOUNTER — Ambulatory Visit (INDEPENDENT_AMBULATORY_CARE_PROVIDER_SITE_OTHER): Payer: Medicare Other

## 2013-04-12 ENCOUNTER — Ambulatory Visit: Payer: Self-pay | Admitting: Internal Medicine

## 2013-04-12 ENCOUNTER — Ambulatory Visit (INDEPENDENT_AMBULATORY_CARE_PROVIDER_SITE_OTHER): Payer: Medicare Other | Admitting: Pharmacist Clinician (PhC)/ Clinical Pharmacy Specialist

## 2013-04-12 VITALS — BP 120/80 | HR 68

## 2013-04-12 DIAGNOSIS — I480 Paroxysmal atrial fibrillation: Secondary | ICD-10-CM

## 2013-04-12 DIAGNOSIS — Z7901 Long term (current) use of anticoagulants: Secondary | ICD-10-CM

## 2013-04-12 DIAGNOSIS — I4891 Unspecified atrial fibrillation: Secondary | ICD-10-CM

## 2013-04-12 DIAGNOSIS — J309 Allergic rhinitis, unspecified: Secondary | ICD-10-CM

## 2013-04-18 ENCOUNTER — Ambulatory Visit (INDEPENDENT_AMBULATORY_CARE_PROVIDER_SITE_OTHER): Payer: No Typology Code available for payment source | Admitting: Marriage and Family Therapist

## 2013-04-18 DIAGNOSIS — F331 Major depressive disorder, recurrent, moderate: Secondary | ICD-10-CM

## 2013-04-18 DIAGNOSIS — F4310 Post-traumatic stress disorder, unspecified: Secondary | ICD-10-CM

## 2013-04-18 NOTE — Progress Notes (Signed)
   THERAPIST PROGRESS NOTE  Session Time:  1:00 - 2:00 p.m.  Participation Level: Active  Behavioral Response: CasualAlert "out of sorts"  Type of Therapy: Individual Therapy  Treatment Goals addressed: Coping  Interventions: Strength-based and Supportive  Summary: Michele [name] is a 62 y.o. female who presents with depression and PTSD.  She was referred by Forde Radon, counselor.   Patient reports she is "not depressed, but feeling out of sorts."  She admitted it is because she has not been working out like she would like and in turn she is not losing weight.  She reports being about 300 lbs.  She also reports she uses food to "fill a void" due mainly because of loneliness.  She reports having phone calls from her family daily but still feels alone.  She talked about "getting married" but believes unless she gets to pick the kind of man she wants marriage will never happen.    Suicidal/Homicidal: Nowithout intent/plan  Therapist Response:  Discussed patient's problems with doing some consistent exercise.  Talked about patient being more forgiving about what she can and cannot do relating to multiple medical problems and depression.  Commended patient for being able to identify why she eats (to fill a void).  Recommended the book "Anatomy of a Food Addiction."  Patient states she wants to continue to therapy with this Clinical research associate and discussed why this cannot happen now due to this writer not being able to follow through on seeing patient regularly due to scheduling.  Patient did continue to say she would like to continue so if this writer discharges someone could she come back.  Discussed patient's need to talk to her therapist about this issue (who is coming back from maternity leave on 10/7.  Plan:  Patient will schedule sessions with previous therapist.  Diagnosis: Axis I:  Major depressive d/o, moderate; PTSD    Axis II: Deferred    Keyshawn Hellwig, LMFT, CTS 04/18/2013

## 2013-04-19 ENCOUNTER — Ambulatory Visit (INDEPENDENT_AMBULATORY_CARE_PROVIDER_SITE_OTHER): Payer: Medicare Other

## 2013-04-19 DIAGNOSIS — J309 Allergic rhinitis, unspecified: Secondary | ICD-10-CM

## 2013-04-25 ENCOUNTER — Other Ambulatory Visit: Payer: Self-pay | Admitting: *Deleted

## 2013-04-25 ENCOUNTER — Encounter: Payer: Self-pay | Admitting: Internal Medicine

## 2013-04-25 ENCOUNTER — Encounter (INDEPENDENT_AMBULATORY_CARE_PROVIDER_SITE_OTHER): Payer: Self-pay

## 2013-04-25 ENCOUNTER — Ambulatory Visit (HOSPITAL_BASED_OUTPATIENT_CLINIC_OR_DEPARTMENT_OTHER): Payer: Medicare Other | Admitting: Internal Medicine

## 2013-04-25 VITALS — BP 111/74 | HR 91 | Temp 98.2°F | Resp 18 | Ht 62.0 in | Wt 305.7 lb

## 2013-04-25 DIAGNOSIS — D509 Iron deficiency anemia, unspecified: Secondary | ICD-10-CM

## 2013-04-25 DIAGNOSIS — D649 Anemia, unspecified: Secondary | ICD-10-CM

## 2013-04-25 NOTE — Progress Notes (Signed)
[facility] [phone]   [phone]  OFFICE PROGRESS NOTE  Lucilla Edin, MD [address] [place] [address]  DIAGNOSIS: Unspecified anemia questionable for anemia of chronic disease/iron deficiency.   PRIOR THERAPY: None   CURRENT THERAPY: Observation.  INTERVAL HISTORY: Michele [name] 62 y.o. female returns to the clinic today for six-month followup visit. The patient is feeling fine today with no specific complaints except for mild fatigue. She denied having any bleeding, bruises or ecchymosis. The patient denied having any significant chest pain, shortness of breath, cough or hemoptysis. She has no dizzy spells. She denied having any significant weight loss or night sweats. She had repeat CBC and iron study performed recently and she is here for evaluation and discussion of her lab results.   MEDICAL HISTORY: Past Medical History  Diagnosis Date  . Allergic rhinitis   . Asthma   . CAD (coronary artery disease)   . Hypertension   . Sleep apnea   . Diabetes mellitus type II   . Depression   . PTSD (post-traumatic stress disorder)   . 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 Carpal tunnel syndrome, right   . Hyperlipidemia   . Paroxysmal a-fib   . CAD (coronary artery disease) 01/30/2007    stents trivial coronary artery disease diffusely, with a recent deployment od a intracoronary artery stent, 3.5 x 12 mm driver stent with no more than 20-30% in- stents restenosis.done by Dr Lavonne Chick with re-look on novenber 10 2008 revealing a widely patent stent with otherwise trival CAD and normal LV function  . Chest pain 07/17/12009    2 D Echo EF >55%    ALLERGIES:  is allergic to meprobamate.  MEDICATIONS:  Current Outpatient Prescriptions  Medication Sig Dispense Refill  . albuterol (PROVENTIL) (2.5 MG/3ML) 0.083% nebulizer solution Take 3 mLs (2.5 mg total) by nebulization every 6 (six) hours as needed.  360 mL  2  .  ARIPiprazole (ABILIFY) 15 MG tablet Take 1 tablet (15 mg total) by mouth daily.  30 tablet  2  . aspirin 81 MG tablet Take 81 mg by mouth daily.        . benztropine (COGENTIN) 0.5 MG tablet Take 1 tablet (0.5 mg total) by mouth at bedtime.  30 tablet  2  . diltiazem (CARDIZEM CD) 240 MG 24 hr capsule Take 240 mg by mouth daily.        . fexofenadine (ALLEGRA) 60 MG tablet Take 1 tablet (60 mg total) by mouth daily. Take 60 mg by mouth daily.  30 tablet  11  . Fluticasone-Salmeterol (ADVAIR DISKUS) 250-50 MCG/DOSE AEPB Inhale 1 puff into the lungs every 12 (twelve) hours.  60 each  2  . furosemide (LASIX) 40 MG tablet TAKE ONE (1) TABLET BY MOUTH EVERY DAY  30 tablet  4  . HYDROcodone-acetaminophen (NORCO) 5-325 MG per tablet Take 1 tablet by mouth every 6 (six) hours as needed.        . isosorbide dinitrate (ISORDIL) 30 MG tablet Take 1 by mouth daily       . metFORMIN (GLUCOPHAGE-XR) 500 MG 24 hr tablet Take 1 tablet (500 mg total) by mouth daily with breakfast.  90 tablet  1  . metoprolol (TOPROL-XL) 100 MG 24 hr tablet Take 100 mg by mouth daily.        . mometasone (NASONEX) 50 MCG/ACT nasal spray Place 2 sprays into the nose  daily.  17 g  2  . morphine (MSIR) 15 MG tablet Take 1 by mouth at bedtime        . nitroGLYCERIN (NITROSTAT) 0.4 MG SL tablet Place 1 tablet (0.4 mg total) under the tongue every 5 (five) minutes as needed.  25 tablet  3  . pantoprazole (PROTONIX) 20 MG tablet Take 20 mg by mouth daily.      . potassium chloride (KLOR-CON) 20 MEQ packet Take 1 by mouth daily       . rosuvastatin (CRESTOR) 10 MG tablet TAKE ONE (1) TABLET BY MOUTH EVERY OTHER DAY  90 tablet  1  . sertraline (ZOLOFT) 100 MG tablet Take 2 by mouth once daily  60 tablet  2  . Trospium Chloride (SANCTURA XR) 60 MG CP24 Take 1 capsule (60 mg total) by mouth daily.  30 each  2  . valsartan-hydrochlorothiazide (DIOVAN-HCT) 160-12.5 MG per tablet TAKE ONE (1) TABLET BY MOUTH EVERY DAY  30 tablet  9  . VENTOLIN  HFA 108 (90 BASE) MCG/ACT inhaler INHALE TWO PUFFS INTO THE LUNGS EVERY 6 HOURS AS NEEDED  18 g  4  . warfarin (COUMADIN) 7.5 MG tablet Take as directed by Dr. Gery Pray.      . [DISCONTINUED] SANCTURA XR 60 MG CP24 Take 1 capsule by mouth daily.       No current facility-administered medications for this visit.    SURGICAL HISTORY:  Past Surgical History  Procedure Laterality Date  . Cholecystectomy    . Left groin cyst    . Kidney stones    . Stress myocardial perfusion study  09/08/2010    normal; EF 75%  . Multilevel lumbar degenerative disc disease    . Hemorrhoid surgery    . Gallbladder surgery      REVIEW OF SYSTEMS:  A comprehensive review of systems was negative except for: Constitutional: positive for fatigue   PHYSICAL EXAMINATION: General appearance: alert, cooperative, fatigued and no distress Head: Normocephalic, without obvious abnormality, atraumatic Neck: no adenopathy, no JVD, supple, symmetrical, trachea midline and thyroid not enlarged, symmetric, no tenderness/mass/nodules Lymph nodes: Cervical, supraclavicular, and axillary nodes normal. Resp: clear to auscultation bilaterally Back: symmetric, no curvature. ROM normal. No CVA tenderness. Cardio: regular rate and rhythm, S1, S2 normal, no murmur, click, rub or gallop GI: soft, non-tender; bowel sounds normal; no masses,  no organomegaly Extremities: extremities normal, atraumatic, no cyanosis or edema  ECOG PERFORMANCE STATUS: 1 - Symptomatic but completely ambulatory  Blood pressure 111/74, pulse 91, temperature 98.2 F (36.8 C), temperature source Oral, resp. rate 18, height 5\' 2"  (1.575 m), weight 305 lb 11.2 oz (138.665 kg).  LABORATORY DATA: Lab Results  Component Value Date   WBC 8.9 04/10/2013   HGB 11.2* 04/10/2013   HCT 35.3 04/10/2013   MCV 66.6* 04/10/2013   PLT 352 04/10/2013      Chemistry      Component Value Date/Time   NA 144 10/02/2012 0905   NA 140 01/07/2012 1458   K 3.4* 10/02/2012 0905    K 3.6 01/07/2012 1458   CL 101 10/02/2012 0905   CL 100 01/07/2012 1458   CO2 31* 10/02/2012 0905   CO2 33* 01/07/2012 1458   BUN 13.9 10/02/2012 0905   BUN 14 01/07/2012 1458   CREATININE 0.8 10/02/2012 0905   CREATININE 0.76 01/07/2012 1458      Component Value Date/Time   CALCIUM 9.9 10/02/2012 0905   CALCIUM 9.8 01/07/2012 1458   ALKPHOS 87  03/30/2011 1330   AST 18 03/30/2011 1330   ALT 18 03/30/2011 1330   BILITOT 0.4 03/30/2011 1330     Other lab result: Ferritin 215, serum iron 50, total iron-binding capacity to 98 and iron saturation 17%.  RADIOGRAPHIC STUDIES: No results found.  ASSESSMENT AND PLAN: This is a very pleasant 62 years old [place] American female with history of persistent anemia questionable for anemia of chronic disease plus/minus iron deficiency. Her hemoglobin and hematocrit has been stable over the last several months and the patient has normal iron study. I discussed the lab result with the patient today. I recommended for her to continue on oral iron tablet 1-2 tablets over the counter. She would come back for followup visit in 6 months with repeat CBC and iron study. She was advised to call immediately if she has any concerning symptoms in the interval.  The patient voices understanding of current disease status and treatment options and is in agreement with the current care plan.  All questions were answered. The patient knows to call the clinic with any problems, questions or concerns. We can certainly see the patient much sooner if necessary.

## 2013-04-25 NOTE — Telephone Encounter (Signed)
gv and printed appt sched and avs for pt for pt for NOV

## 2013-04-25 NOTE — Patient Instructions (Signed)
Followup visit in 6 months with repeat CBC and iron study. 

## 2013-04-26 ENCOUNTER — Ambulatory Visit (INDEPENDENT_AMBULATORY_CARE_PROVIDER_SITE_OTHER): Payer: Medicare Other | Admitting: Pharmacist Clinician (PhC)/ Clinical Pharmacy Specialist

## 2013-04-26 ENCOUNTER — Ambulatory Visit (INDEPENDENT_AMBULATORY_CARE_PROVIDER_SITE_OTHER): Payer: Medicare Other

## 2013-04-26 VITALS — BP 140/74 | HR 76

## 2013-04-26 DIAGNOSIS — J309 Allergic rhinitis, unspecified: Secondary | ICD-10-CM

## 2013-04-26 DIAGNOSIS — Z7901 Long term (current) use of anticoagulants: Secondary | ICD-10-CM

## 2013-04-26 DIAGNOSIS — I480 Paroxysmal atrial fibrillation: Secondary | ICD-10-CM

## 2013-04-26 DIAGNOSIS — I4891 Unspecified atrial fibrillation: Secondary | ICD-10-CM

## 2013-05-01 ENCOUNTER — Ambulatory Visit (INDEPENDENT_AMBULATORY_CARE_PROVIDER_SITE_OTHER): Payer: No Typology Code available for payment source | Admitting: Psychology

## 2013-05-01 ENCOUNTER — Encounter (INDEPENDENT_AMBULATORY_CARE_PROVIDER_SITE_OTHER): Payer: Self-pay

## 2013-05-01 DIAGNOSIS — F331 Major depressive disorder, recurrent, moderate: Secondary | ICD-10-CM

## 2013-05-01 DIAGNOSIS — F4310 Post-traumatic stress disorder, unspecified: Secondary | ICD-10-CM

## 2013-05-01 NOTE — Progress Notes (Signed)
   THERAPIST PROGRESS NOTE  Session Time: 9am-10.05am  Participation Level: Active  Behavioral Response: Well GroomedAlertDepressed  Type of Therapy: Individual Therapy  Treatment Goals addressed: Coping  Interventions: Strength-based and Supportive  Summary: Michele [name] is a 62 y.o. female who presents with report of some depressed mood. This is first session with this therapist since therapist return from leave.  Pt shared about connection felt with therapist that provided care during leave- expressing that as closer in age felt good connection.  Pt understood that couldn't continue with [facility] as not availability in her schedule to give care needed.  Pt expressed still wanted to work with this provider and didn't identify any needs not being met with this therapist. Pt discussed her sibling relationships and sense of security from relationships with brothers that feels lost w/ their death and trauma experienced. Pt able to connect with gratitude feels, how to continue to feel connected to these relationships and her role ion passing on to next generation.  Pt awareness and insight of how she is doing well givimg life experiences. Pt talked of her family history and positive stories in her past and expressing feeling good that discussed today as less depressed.   Suicidal/Homicidal: Nowithout intent/plan  Therapist Response: Assessed pt current functioning per pt report. Explored pt treatment during counselors maternity leave.  Affirmed pt connection with Joanie tomar and explored needs /wants in therapeutic relationship.  Reflected pt strengths in her relationships and encouraged pt to identify how she connects with in the present.  Assisted pt in acknowledging her positive worth.   Plan: Return again in 2 weeks.  Diagnosis: Axis I: Post Traumatic Stress Disorder and MDD    Axis II: No diagnosis    [name],[name], [place] 05/01/2013

## 2013-05-08 ENCOUNTER — Other Ambulatory Visit: Payer: Self-pay | Admitting: Pharmacist Clinician (PhC)/ Clinical Pharmacy Specialist

## 2013-05-08 NOTE — Telephone Encounter (Signed)
Rx was sent to pharmacy electronically. 

## 2013-05-10 ENCOUNTER — Ambulatory Visit (INDEPENDENT_AMBULATORY_CARE_PROVIDER_SITE_OTHER): Payer: Medicare Other

## 2013-05-10 DIAGNOSIS — J309 Allergic rhinitis, unspecified: Secondary | ICD-10-CM

## 2013-05-15 ENCOUNTER — Ambulatory Visit (INDEPENDENT_AMBULATORY_CARE_PROVIDER_SITE_OTHER): Payer: No Typology Code available for payment source | Admitting: Psychology

## 2013-05-15 DIAGNOSIS — F331 Major depressive disorder, recurrent, moderate: Secondary | ICD-10-CM

## 2013-05-15 DIAGNOSIS — F4310 Post-traumatic stress disorder, unspecified: Secondary | ICD-10-CM

## 2013-05-16 NOTE — Progress Notes (Signed)
   THERAPIST PROGRESS NOTE  Session Time: 11.09am-12pm  Participation Level: Active  Behavioral Response: Well GroomedAlert, AFFECT WNL  Type of Therapy: Individual Therapy  Treatment Goals addressed: Diagnosis: MDD, PTSD  Interventions: Strength-based and Supportive  Summary: Michele [name] is a 62 y.o. female who presents with report of being tired as she has been busy with helping to care for her granddaughter, sister recent trip to ER and pt pain.  Pt and counselor discussed the option of transfer to [facility] as now availability on her schedule.  Pt expressed mixed emotions of transfer.  Pt able to identify feeling of loss w/ leaving current counselor but also identifying that has been "stuck" in therapy lately and when worked w/ [employer] during counselor's leave felt better fit and was making new progress again. Pt good insight and was able to process mixed feelings.  Pt was able to reframe that this opportunity instead of focusing on worry of loss or concern for counselor's feelings.  Pt also brought in the books re: her family's history and discussed feelings of connection to ancestors and feeling of belonging w/ knowing this history.     Suicidal/Homicidal: Nowithout intent/plan  Therapist Response: Assessed pt current functioning per pt report.  Discussed w/ pt the opportunity for transfer to [facility]ne Dy, LMFT as pt had requested and processed w/pt feelings re:Marland Kitchen  Assisted pt in identifying progress made w/ current counselor and current needs with having plateaued with current counselor.  Supported pt in her identified needs and strength in being able to identify.  Discussed w/pt her families history and metaphor of how she can also work on "her story".  Plan: Transfer to [employer], LMFT. Return again in 2 weeks.  Diagnosis: Axis I: Post Traumatic Stress Disorder and MDD    Axis II: No diagnosis    [name],[name], LPC 05/16/2013

## 2013-05-17 ENCOUNTER — Ambulatory Visit (INDEPENDENT_AMBULATORY_CARE_PROVIDER_SITE_OTHER): Payer: Medicare Other

## 2013-05-17 DIAGNOSIS — J309 Allergic rhinitis, unspecified: Secondary | ICD-10-CM

## 2013-05-24 ENCOUNTER — Ambulatory Visit (INDEPENDENT_AMBULATORY_CARE_PROVIDER_SITE_OTHER): Payer: Medicare Other

## 2013-05-24 ENCOUNTER — Ambulatory Visit (INDEPENDENT_AMBULATORY_CARE_PROVIDER_SITE_OTHER): Payer: Medicare Other | Admitting: Pharmacist Clinician (PhC)/ Clinical Pharmacy Specialist

## 2013-05-24 VITALS — BP 140/78 | HR 84

## 2013-05-24 DIAGNOSIS — I4891 Unspecified atrial fibrillation: Secondary | ICD-10-CM

## 2013-05-24 DIAGNOSIS — I480 Paroxysmal atrial fibrillation: Secondary | ICD-10-CM

## 2013-05-24 DIAGNOSIS — J309 Allergic rhinitis, unspecified: Secondary | ICD-10-CM

## 2013-05-24 DIAGNOSIS — Z7901 Long term (current) use of anticoagulants: Secondary | ICD-10-CM

## 2013-05-24 LAB — POCT INR: INR: 1.7

## 2013-05-29 ENCOUNTER — Ambulatory Visit (INDEPENDENT_AMBULATORY_CARE_PROVIDER_SITE_OTHER): Payer: No Typology Code available for payment source | Admitting: Marriage and Family Therapist

## 2013-05-29 DIAGNOSIS — F4310 Post-traumatic stress disorder, unspecified: Secondary | ICD-10-CM

## 2013-05-29 DIAGNOSIS — F331 Major depressive disorder, recurrent, moderate: Secondary | ICD-10-CM
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
## 2013-05-31 ENCOUNTER — Ambulatory Visit: Payer: Medicare Other

## 2013-06-01 ENCOUNTER — Ambulatory Visit (INDEPENDENT_AMBULATORY_CARE_PROVIDER_SITE_OTHER): Payer: Medicare Other

## 2013-06-01 DIAGNOSIS — J309 Allergic rhinitis, unspecified: Secondary | ICD-10-CM

## 2013-06-01 DIAGNOSIS — Z23 Encounter for immunization: Secondary | ICD-10-CM

## 2013-06-04 ENCOUNTER — Other Ambulatory Visit: Payer: Self-pay | Admitting: Cardiovascular Disease

## 2013-06-04 ENCOUNTER — Other Ambulatory Visit: Payer: Self-pay | Admitting: Pharmacist Clinician (PhC)/ Clinical Pharmacy Specialist

## 2013-06-04 NOTE — Telephone Encounter (Signed)
Rx was sent to pharmacy electronically. 

## 2013-06-07 ENCOUNTER — Ambulatory Visit: Payer: Self-pay

## 2013-06-08 ENCOUNTER — Ambulatory Visit (INDEPENDENT_AMBULATORY_CARE_PROVIDER_SITE_OTHER): Payer: Medicare Other

## 2013-06-08 DIAGNOSIS — J309 Allergic rhinitis, unspecified: Secondary | ICD-10-CM

## 2013-06-15 ENCOUNTER — Ambulatory Visit: Payer: Self-pay

## 2013-06-19 ENCOUNTER — Encounter: Payer: Self-pay | Admitting: Emergency Medicine

## 2013-06-19 ENCOUNTER — Ambulatory Visit (INDEPENDENT_AMBULATORY_CARE_PROVIDER_SITE_OTHER): Payer: Medicare Other | Admitting: Emergency Medicine

## 2013-06-19 VITALS — BP 132/79 | HR 70 | Temp 97.6°F | Resp 18 | Ht 63.0 in | Wt 313.0 lb

## 2013-06-19 DIAGNOSIS — Z1211 Encounter for screening for malignant neoplasm of colon: Secondary | ICD-10-CM

## 2013-06-19 DIAGNOSIS — K921 Melena: Secondary | ICD-10-CM

## 2013-06-19 DIAGNOSIS — Z01419 Encounter for gynecological examination (general) (routine) without abnormal findings: Secondary | ICD-10-CM

## 2013-06-19 DIAGNOSIS — Z124 Encounter for screening for malignant neoplasm of cervix: Secondary | ICD-10-CM

## 2013-06-19 DIAGNOSIS — E119 Type 2 diabetes mellitus without complications: Secondary | ICD-10-CM

## 2013-06-19 LAB — GLUCOSE, POCT (MANUAL RESULT ENTRY): POC Glucose: 103 mg/dl — AB (ref 70–99)

## 2013-06-19 NOTE — Progress Notes (Signed)
   Subjective:    Patient ID: Michele [name], female    DOB: 1950-09-04, 62 y.o.   MRN: [id number]  HPI patient here for Pap smear. Her last Pap was done 2 years ago. She is denying any vaginal bleeding. She overall feels well    Review of Systems     Objective:   Physical Exam GU is that of a normal female. Vulva are without lesions. Cervix appears normal Pap smear was obtained uterus is not enlarged there are no adnexal masses rectal exam was also performed .  Results for orders placed in visit on 06/19/13  GLUCOSE, POCT (MANUAL RESULT ENTRY)      Result Value Range   POC Glucose 103 (*) 70 - 99 mg/dl  POCT GLYCOSYLATED HEMOGLOBIN (HGB A1C)      Result Value Range   Hemoglobin A1C 5.8    IFOBT (OCCULT BLOOD)      Result Value Range   IFOBT Positive          Assessment & Plan:  Diabetes is under good control. There was blood in her stool. I'm going to have Dr. Loreta Ave see her again

## 2013-06-20 LAB — PAP IG (IMAGE GUIDED)

## 2013-06-21 ENCOUNTER — Ambulatory Visit: Payer: Self-pay | Admitting: Pharmacist Clinician (PhC)/ Clinical Pharmacy Specialist

## 2013-06-21 ENCOUNTER — Ambulatory Visit (INDEPENDENT_AMBULATORY_CARE_PROVIDER_SITE_OTHER): Payer: Medicare Other | Admitting: Pharmacist Clinician (PhC)/ Clinical Pharmacy Specialist

## 2013-06-21 ENCOUNTER — Ambulatory Visit (HOSPITAL_COMMUNITY): Payer: No Typology Code available for payment source | Admitting: Marriage and Family Therapist

## 2013-06-21 ENCOUNTER — Ambulatory Visit (INDEPENDENT_AMBULATORY_CARE_PROVIDER_SITE_OTHER): Payer: Medicare Other

## 2013-06-21 VITALS — BP 140/86 | HR 84

## 2013-06-21 DIAGNOSIS — I4891 Unspecified atrial fibrillation: Secondary | ICD-10-CM

## 2013-06-21 DIAGNOSIS — I480 Paroxysmal atrial fibrillation: Secondary | ICD-10-CM

## 2013-06-21 DIAGNOSIS — J309 Allergic rhinitis, unspecified: Secondary | ICD-10-CM

## 2013-06-21 DIAGNOSIS — Z7901 Long term (current) use of anticoagulants: Secondary | ICD-10-CM

## 2013-06-21 LAB — POCT INR: INR: 2.1

## 2013-06-26 ENCOUNTER — Ambulatory Visit (INDEPENDENT_AMBULATORY_CARE_PROVIDER_SITE_OTHER): Payer: No Typology Code available for payment source | Admitting: Marriage and Family Therapist

## 2013-06-26 DIAGNOSIS — F331 Major depressive disorder, recurrent, moderate: Secondary | ICD-10-CM

## 2013-06-26 DIAGNOSIS — F4310 Post-traumatic stress disorder, unspecified: Secondary | ICD-10-CM
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
## 2013-06-28 ENCOUNTER — Ambulatory Visit (INDEPENDENT_AMBULATORY_CARE_PROVIDER_SITE_OTHER): Payer: Medicare Other

## 2013-06-28 DIAGNOSIS — J309 Allergic rhinitis, unspecified: Secondary | ICD-10-CM

## 2013-07-04 ENCOUNTER — Ambulatory Visit (INDEPENDENT_AMBULATORY_CARE_PROVIDER_SITE_OTHER): Payer: No Typology Code available for payment source | Admitting: Psychiatry

## 2013-07-04 ENCOUNTER — Encounter (HOSPITAL_COMMUNITY): Payer: Self-pay | Admitting: Psychiatry

## 2013-07-04 DIAGNOSIS — F331 Major depressive disorder, recurrent, moderate: Secondary | ICD-10-CM

## 2013-07-04 DIAGNOSIS — F29 Unspecified psychosis not due to a substance or known physiological condition: Secondary | ICD-10-CM

## 2013-07-04 DIAGNOSIS — F329 Major depressive disorder, single episode, unspecified: Secondary | ICD-10-CM

## 2013-07-04 DIAGNOSIS — F4310 Post-traumatic stress disorder, unspecified: Secondary | ICD-10-CM

## 2013-07-04 MED ORDER — ARIPIPRAZOLE 15 MG PO TABS: 15.0000 mg | ORAL_TABLET | Freq: Every day | ORAL | Status: DC

## 2013-07-04 MED ORDER — BENZTROPINE MESYLATE 0.5 MG PO TABS: 0.5000 mg | ORAL_TABLET | Freq: Every day | ORAL | Status: DC

## 2013-07-04 MED ORDER — SERTRALINE HCL 100 MG PO TABS: ORAL_TABLET | ORAL | Status: DC
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
## 2013-07-05 ENCOUNTER — Ambulatory Visit: Payer: Medicare Other

## 2013-07-10 ENCOUNTER — Ambulatory Visit (INDEPENDENT_AMBULATORY_CARE_PROVIDER_SITE_OTHER): Payer: No Typology Code available for payment source | Admitting: Marriage and Family Therapist

## 2013-07-10 DIAGNOSIS — F331 Major depressive disorder, recurrent, moderate: Secondary | ICD-10-CM

## 2013-07-10 DIAGNOSIS — F4310 Post-traumatic stress disorder, unspecified: Secondary | ICD-10-CM
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
## 2013-07-17 ENCOUNTER — Ambulatory Visit (INDEPENDENT_AMBULATORY_CARE_PROVIDER_SITE_OTHER): Payer: Medicare Other | Admitting: Pharmacist Clinician (PhC)/ Clinical Pharmacy Specialist

## 2013-07-17 ENCOUNTER — Encounter: Payer: Self-pay | Admitting: Cardiovascular Disease

## 2013-07-17 ENCOUNTER — Ambulatory Visit (INDEPENDENT_AMBULATORY_CARE_PROVIDER_SITE_OTHER): Payer: Medicare Other | Admitting: Cardiovascular Disease

## 2013-07-17 VITALS — BP 138/72 | HR 60 | Ht 62.0 in | Wt 307.0 lb

## 2013-07-17 DIAGNOSIS — Z79899 Other long term (current) drug therapy: Secondary | ICD-10-CM

## 2013-07-17 DIAGNOSIS — I480 Paroxysmal atrial fibrillation: Secondary | ICD-10-CM

## 2013-07-17 DIAGNOSIS — I10 Essential (primary) hypertension: Secondary | ICD-10-CM

## 2013-07-17 DIAGNOSIS — E785 Hyperlipidemia, unspecified: Secondary | ICD-10-CM

## 2013-07-17 DIAGNOSIS — I4891 Unspecified atrial fibrillation: Secondary | ICD-10-CM

## 2013-07-17 DIAGNOSIS — Z7901 Long term (current) use of anticoagulants: Secondary | ICD-10-CM

## 2013-07-17 DIAGNOSIS — I2510 Atherosclerotic heart disease of native coronary artery without angina pectoris: Secondary | ICD-10-CM

## 2013-07-17 NOTE — Assessment & Plan Note (Signed)
On statin therapy. We will recheck a lipid and liver profile 

## 2013-07-17 NOTE — Assessment & Plan Note (Signed)
Controlled on current medications 

## 2013-07-17 NOTE — Assessment & Plan Note (Signed)
During sinus rhythm on Coumadin anticoagulation followed here in our clinic

## 2013-07-17 NOTE — Patient Instructions (Signed)
  We will see you back in follow up in 1 year with Dr Berry  Dr Berry has ordered blood work to be done fasting.    

## 2013-07-17 NOTE — Progress Notes (Signed)
07/17/2013 Michele [name]   02/03/1951  [id number]  Primary Physician [name], MD Primary Cardiologist: Lorretta Harp MD Renae Gloss   HPI:  Michele [name] is a 62 y.o. female was morbidly obese and has a history of coronary disease status post stent to the proximal RCA in 2008. Her history also includes diabetes mellitus type 2, hypertension, sleep apnea, asthma, paroxysmal intrafibrillation, hyperlipidemia, arthritis. Her last nuclear stress test was February 2012 post-rest ejection fraction 75% with no ischemic changes. Her last 2-D echocardiogram was July 2009 showed ejection fraction greater than 55% trace mitral and tricuspid regurgitation otherwise normal.  Patient presents today with chest pain which are brief. She describes as an ache and is 4/10 in intensity. She states that walking makes the pain worse and rest makes it better. She also reports some nausea, shortness of breath and infrequent and dizziness. She denies diaphoresis lower extremity edema vomiting fever orthopnea PND. She also reports she has a large mass in backside of her thigh.  Since she was seen last by Tenny Craw, PA-C she underwent a Myoview stress test which was entirely normal. She's had no recurrent symptoms.since I saw her 6 months ago she has been pain-free.    Current Outpatient Prescriptions  Medication Sig Dispense Refill  . albuterol (PROVENTIL) (2.5 MG/3ML) 0.083% nebulizer solution Take 3 mLs (2.5 mg total) by nebulization every 6 (six) hours as needed.  360 mL  2  . ARIPiprazole (ABILIFY) 15 MG tablet Take 1 tablet (15 mg total) by mouth daily.  30 tablet  2  . aspirin 81 MG tablet Take 81 mg by mouth daily.        . benztropine (COGENTIN) 0.5 MG tablet Take 1 tablet (0.5 mg total) by mouth at bedtime.  30 tablet  2  . diltiazem (CARDIZEM CD) 240 MG 24 hr capsule TAKE ONE CAPSULE BY MOUTH DAILY  30 capsule  7  . fexofenadine (ALLEGRA) 60 MG tablet Take 1 tablet (60 mg  total) by mouth daily. Take 60 mg by mouth daily.  30 tablet  11  . Fluticasone-Salmeterol (ADVAIR DISKUS) 250-50 MCG/DOSE AEPB Inhale 1 puff into the lungs every 12 (twelve) hours.  60 each  2  . furosemide (LASIX) 40 MG tablet TAKE ONE (1) TABLET BY MOUTH EVERY DAY  30 tablet  4  . HYDROcodone-acetaminophen (NORCO) 5-325 MG per tablet Take 1 tablet by mouth every 6 (six) hours as needed.        . isosorbide dinitrate (ISORDIL) 30 MG tablet Take 1 by mouth daily       . isosorbide mononitrate (IMDUR) 30 MG 24 hr tablet TAKE ONE (1) TABLET BY MOUTH EVERY DAY  90 tablet  1  . metFORMIN (GLUCOPHAGE-XR) 500 MG 24 hr tablet Take 1 tablet (500 mg total) by mouth daily with breakfast.  90 tablet  1  . metoprolol (TOPROL-XL) 100 MG 24 hr tablet Take 100 mg by mouth daily.        . mometasone (NASONEX) 50 MCG/ACT nasal spray Place 2 sprays into the nose daily.  17 g  2  . morphine (MSIR) 15 MG tablet Take 1 by mouth at bedtime        . nitroGLYCERIN (NITROSTAT) 0.4 MG SL tablet Place 1 tablet (0.4 mg total) under the tongue every 5 (five) minutes as needed.  25 tablet  3  . pantoprazole (PROTONIX) 20 MG tablet Take 20 mg by mouth daily.      Marland Kitchen  potassium chloride (KLOR-CON) 20 MEQ packet Take 1 by mouth daily       . rosuvastatin (CRESTOR) 10 MG tablet TAKE ONE (1) TABLET BY MOUTH EVERY OTHER DAY  90 tablet  1  . sertraline (ZOLOFT) 100 MG tablet Take 2 by mouth once daily  60 tablet  2  . Trospium Chloride (SANCTURA XR) 60 MG CP24 Take 1 capsule (60 mg total) by mouth daily.  30 each  2  . valsartan-hydrochlorothiazide (DIOVAN-HCT) 160-12.5 MG per tablet TAKE ONE (1) TABLET BY MOUTH EVERY DAY  30 tablet  9  . VENTOLIN HFA 108 (90 BASE) MCG/ACT inhaler INHALE TWO PUFFS INTO THE LUNGS EVERY 6 HOURS AS NEEDED  18 g  4  . warfarin (COUMADIN) 7.5 MG tablet Take 1 & 1/2 to 2 tablets by mouth daily as directed  55 tablet  5  . [DISCONTINUED] SANCTURA XR 60 MG CP24 Take 1 capsule by mouth daily.       No  current facility-administered medications for this visit.    Allergies  Allergen Reactions  . Meprobamate Nausea And Vomiting    History   Social History  . Marital Status: Divorced    Spouse Name: N/A    Number of Children: 2  . Years of Education: N/A   Occupational History  . N[employer]SING [employer]    Social History Main Topics  . Smoking status: Never Smoker   . Smokeless tobacco: Never Used  . Alcohol Use: No  . Drug Use: No  . Sexual Activity: Not on file   Other Topics Concern  . Not on file   Social History Narrative  . No narrative on file     Review of Systems: General: negative for chills, fever, night sweats or weight changes.  Cardiovascular: negative for chest pain, dyspnea on exertion, edema, orthopnea, palpitations, paroxysmal nocturnal dyspnea or shortness of breath Dermatological: negative for rash Respiratory: negative for cough or wheezing Urologic: negative for hematuria Abdominal: negative for nausea, vomiting, diarrhea, bright red blood per rectum, melena, or hematemesis Neurologic: negative for visual changes, syncope, or dizziness All other systems reviewed and are otherwise negative except as noted above.    Blood pressure 138/72, pulse 60, height 5' 2"  (1.575 m), weight 307 lb (139.254 kg).  General appearance: alert and no distress Neck: no adenopathy, no carotid bruit, no JVD, supple, symmetrical, trachea midline and thyroid not enlarged, symmetric, no tenderness/mass/nodules Lungs: clear to auscultation bilaterally Heart: regular rate and rhythm, S1, S2 normal, no murmur, click, rub or gallop Extremities: extremities normal, atraumatic, no cyanosis or edema  EKG sinus bradycardia of 59 without ST or T wave changes  ASSESSMENT AND PLAN:   CORONARY ARTERY DISEASE Status post proximal RCA stenting in 2008. This was performed by Dr. Melvern Banker. The remainder of her coronary arteries were normal. A recent Myoview showed no evidence of ischemia. She  no longer has chest pain.  HYPERTENSION Controlled on current medications  Paroxysmal a-fib During sinus rhythm on Coumadin anticoagulation followed here in our clinic  Hyperlipidemia On statin therapy. We will recheck a lipid and liver profile.       Lorretta Harp MD FACP,FACC,FAHA, [facility] 07/17/2013 11:56 AM

## 2013-07-17 NOTE — Assessment & Plan Note (Signed)
Status post proximal RCA stenting in 2008. This was performed by Dr. Melvern Banker. The remainder of her coronary arteries were normal. A recent Myoview showed no evidence of ischemia. She no longer has chest pain.

## 2013-07-20 ENCOUNTER — Ambulatory Visit: Payer: Self-pay | Admitting: Pharmacist Clinician (PhC)/ Clinical Pharmacy Specialist

## 2013-07-20 ENCOUNTER — Ambulatory Visit (INDEPENDENT_AMBULATORY_CARE_PROVIDER_SITE_OTHER): Payer: Medicare Other

## 2013-07-20 DIAGNOSIS — J309 Allergic rhinitis, unspecified: Secondary | ICD-10-CM

## 2013-07-24 ENCOUNTER — Ambulatory Visit (HOSPITAL_COMMUNITY): Payer: Self-pay | Admitting: Marriage and Family Therapist

## 2013-07-24 ENCOUNTER — Telehealth (HOSPITAL_COMMUNITY): Payer: Self-pay | Admitting: Marriage and Family Therapist

## 2013-07-24 NOTE — Progress Notes (Unsigned)
   THERAPIST PROGRESS NOTE  Session Time:  10:00 - 11:00 p.m.  Participation Level: Did Not Attend  Behavioral Response:   Type of Therapy: Individual Therapy  Treatment Goals addressed: Coping  Interventions:   Summary: Michele [name] is a 63 y.o. female who presents with depression and PTSD.  She was referred by [name], counselor.   Patient did not show for the session.    Suicidal/Homicidal:   Therapist Response:   Called patient.  She stated she forgot she had an appointment.  Checked in with her and patient stated she was "tired" but did not reports any other emotions.  Told her she would be seen in two weeks.  Plan: Return again in 2 weeks.  Diagnosis: Axis I: Major depressive d/o, moderate; PTSD    Axis II: Deferred    Stephany Poorman, LMFT, CTS 07/24/2013

## 2013-07-26 ENCOUNTER — Ambulatory Visit: Payer: Self-pay

## 2013-07-27 ENCOUNTER — Ambulatory Visit (INDEPENDENT_AMBULATORY_CARE_PROVIDER_SITE_OTHER): Payer: Medicare Other

## 2013-07-27 DIAGNOSIS — J309 Allergic rhinitis, unspecified: Secondary | ICD-10-CM

## 2013-07-27 LAB — HEPATIC FUNCTION PANEL
ALT: 15 U/L (ref 0–35)
AST: 16 U/L (ref 0–37)
Albumin: 4.3 g/dL (ref 3.5–5.2)
Alkaline Phosphatase: 89 U/L (ref 39–117)
Bilirubin, Direct: 0.1 mg/dL (ref 0.0–0.3)
Indirect Bilirubin: 0.4 mg/dL (ref 0.0–0.9)
Total Bilirubin: 0.5 mg/dL (ref 0.3–1.2)
Total Protein: 7.6 g/dL (ref 6.0–8.3)

## 2013-07-27 LAB — LIPID PANEL
Cholesterol: 154 mg/dL (ref 0–200)
HDL: 52 mg/dL (ref >39)
LDL Cholesterol: 75 mg/dL (ref 0–99)
Total CHOL/HDL Ratio: 3 Ratio
Triglycerides: 134 mg/dL (ref <150)
VLDL: 27 mg/dL (ref 0–40)

## 2013-07-30 ENCOUNTER — Encounter: Payer: Self-pay | Admitting: *Deleted

## 2013-07-31 ENCOUNTER — Ambulatory Visit (INDEPENDENT_AMBULATORY_CARE_PROVIDER_SITE_OTHER): Payer: Medicare Other

## 2013-07-31 DIAGNOSIS — J309 Allergic rhinitis, unspecified: Secondary | ICD-10-CM

## 2013-08-02 ENCOUNTER — Ambulatory Visit (INDEPENDENT_AMBULATORY_CARE_PROVIDER_SITE_OTHER): Payer: Medicare Other

## 2013-08-02 ENCOUNTER — Other Ambulatory Visit: Payer: Self-pay | Admitting: Physician Assistant

## 2013-08-02 ENCOUNTER — Other Ambulatory Visit: Payer: Self-pay | Admitting: Emergency Medicine

## 2013-08-02 ENCOUNTER — Other Ambulatory Visit: Payer: Self-pay | Admitting: Internal Medicine

## 2013-08-02 DIAGNOSIS — J309 Allergic rhinitis, unspecified: Secondary | ICD-10-CM

## 2013-08-07 ENCOUNTER — Ambulatory Visit (INDEPENDENT_AMBULATORY_CARE_PROVIDER_SITE_OTHER): Payer: Medicare Other | Admitting: Pharmacist Clinician (PhC)/ Clinical Pharmacy Specialist

## 2013-08-07 VITALS — BP 120/80 | HR 76

## 2013-08-07 DIAGNOSIS — I480 Paroxysmal atrial fibrillation: Secondary | ICD-10-CM

## 2013-08-07 DIAGNOSIS — Z7901 Long term (current) use of anticoagulants: Secondary | ICD-10-CM

## 2013-08-07 DIAGNOSIS — I4891 Unspecified atrial fibrillation: Secondary | ICD-10-CM

## 2013-08-07 LAB — POCT INR: INR: 1.5

## 2013-08-08 ENCOUNTER — Telehealth: Payer: Self-pay | Admitting: *Deleted

## 2013-08-08 ENCOUNTER — Ambulatory Visit (INDEPENDENT_AMBULATORY_CARE_PROVIDER_SITE_OTHER): Payer: No Typology Code available for payment source | Admitting: Marriage and Family Therapist

## 2013-08-08 DIAGNOSIS — F331 Major depressive disorder, recurrent, moderate: Secondary | ICD-10-CM

## 2013-08-08 DIAGNOSIS — F4310 Post-traumatic stress disorder, unspecified: Secondary | ICD-10-CM

## 2013-08-08 NOTE — Telephone Encounter (Signed)
Pt called to speak to you about coming off coumadin for an injection with Dr. Nelva Bush. She knows she has an appointment.

## 2013-08-08 NOTE — Telephone Encounter (Signed)
Pt to have spinal injection from Dr. Nelva Bush on Feb 3, wants to know if okay to stop warfarin.  Advised her that Dr. Gwenlyn Found out of office until Tuesday Jan 27.  Will review with him on that day.
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
## 2013-08-09 ENCOUNTER — Ambulatory Visit: Payer: Self-pay

## 2013-08-15 NOTE — Telephone Encounter (Signed)
Yes pt has been in SR on last visits.  She will need crossover though with Erasmo Downer.

## 2013-08-15 NOTE — Telephone Encounter (Signed)
Please advise 

## 2013-08-17 ENCOUNTER — Ambulatory Visit (INDEPENDENT_AMBULATORY_CARE_PROVIDER_SITE_OTHER): Payer: Medicare Other

## 2013-08-17 DIAGNOSIS — J309 Allergic rhinitis, unspecified: Secondary | ICD-10-CM

## 2013-08-17 MED ORDER — ENOXAPARIN SODIUM 150 MG/ML ~~LOC~~ SOLN: 150.0000 mg | Freq: Two times a day (BID) | SUBCUTANEOUS | Status: DC

## 2013-08-17 NOTE — Telephone Encounter (Signed)
Pt picked up dosing schedule for lovenox bridging.  Reviewed several times with her to help ensure accuracy.  Rx called to [facility] on [address].  Pt will return Tuesday for INR prior to injection from Dr. Nelva Bush

## 2013-08-21 ENCOUNTER — Ambulatory Visit (INDEPENDENT_AMBULATORY_CARE_PROVIDER_SITE_OTHER): Payer: Medicare Other | Admitting: Pharmacist Clinician (PhC)/ Clinical Pharmacy Specialist

## 2013-08-21 ENCOUNTER — Ambulatory Visit: Payer: Self-pay | Admitting: Pulmonary Disease

## 2013-08-21 ENCOUNTER — Ambulatory Visit: Payer: Self-pay | Admitting: Pharmacist Clinician (PhC)/ Clinical Pharmacy Specialist

## 2013-08-21 VITALS — BP 140/86 | HR 72

## 2013-08-21 DIAGNOSIS — I4891 Unspecified atrial fibrillation: Secondary | ICD-10-CM

## 2013-08-21 DIAGNOSIS — I480 Paroxysmal atrial fibrillation: Secondary | ICD-10-CM

## 2013-08-21 DIAGNOSIS — Z7901 Long term (current) use of anticoagulants: Secondary | ICD-10-CM

## 2013-08-21 LAB — POCT INR: INR: 1

## 2013-08-22 ENCOUNTER — Ambulatory Visit (INDEPENDENT_AMBULATORY_CARE_PROVIDER_SITE_OTHER): Payer: No Typology Code available for payment source | Admitting: Marriage and Family Therapist

## 2013-08-22 DIAGNOSIS — F4310 Post-traumatic stress disorder, unspecified: Secondary | ICD-10-CM

## 2013-08-22 DIAGNOSIS — F331 Major depressive disorder, recurrent, moderate: Secondary | ICD-10-CM

## 2013-08-22 NOTE — Progress Notes (Signed)
   THERAPIST PROGRESS NOTE  Session Time:  11:00 - Noon  Participation Level: Active  Behavioral Response: CasualConfusedDepressed  Type of Therapy: Individual Therapy  Treatment Goals addressed: Coping  Interventions: Strength-based and Supportive/Reframing  Summary: Michele [name] is a 63 y.o. female who presents with depression and PTSD.  She was referred by [name], counselor.  Patient reports she has been having difficult days, particularly yesterday "It was a terrible day."  She reports multiple medical visits to various doctors including having a cortisone shot in her knee and her back.  She stated feeling like all she does is go to doctors and pay doctor bills.  Patient also states one of her biggest stressors of financial as she owes $1,000 in copays.  She says she does not know how to give up anything else as her daughter pretty much feeds her and she tried to get food stamps and was approved for only $16.    Suicidal/Homicidal:  No  Therapist Response:   Talked about taking care of herself after this session by resting after having the visits and procedures.  Used reframing about patient believing she is depressed versus "believing she deserves to rest after a difficult week and it is what her body needs."  Also reframed the financial pressure as "she will pay what she can pay and this is the best she can do."  She admits there are times when she can think more positive and times when she cannot when she gets an unexpected bill.  Discussed also how her daughter will always help her and that she needs this help now.  Will continue to work with patient using reframing to decrease her depression and negative outlook on her life.  Plan: Return again in 2 weeks.  Diagnosis: Axis I: Major depressive d/o, moderate; PTSD    Axis II: Deferred    Acea Yagi, LMFT, CTS 08/22/2013

## 2013-08-23 ENCOUNTER — Ambulatory Visit (INDEPENDENT_AMBULATORY_CARE_PROVIDER_SITE_OTHER): Payer: Medicare Other

## 2013-08-23 DIAGNOSIS — J309 Allergic rhinitis, unspecified: Secondary | ICD-10-CM

## 2013-08-27 ENCOUNTER — Ambulatory Visit (INDEPENDENT_AMBULATORY_CARE_PROVIDER_SITE_OTHER): Payer: Medicare Other | Admitting: Pharmacist Clinician (PhC)/ Clinical Pharmacy Specialist

## 2013-08-27 VITALS — BP 116/76 | HR 84

## 2013-08-27 DIAGNOSIS — I4891 Unspecified atrial fibrillation: Secondary | ICD-10-CM

## 2013-08-27 DIAGNOSIS — Z7901 Long term (current) use of anticoagulants: Secondary | ICD-10-CM

## 2013-08-27 DIAGNOSIS — I480 Paroxysmal atrial fibrillation: Secondary | ICD-10-CM

## 2013-08-27 LAB — POCT INR: INR: 4.4

## 2013-09-03 ENCOUNTER — Ambulatory Visit: Payer: Self-pay | Admitting: Internal Medicine

## 2013-09-05 ENCOUNTER — Ambulatory Visit: Payer: Self-pay | Admitting: Internal Medicine

## 2013-09-06 ENCOUNTER — Encounter: Payer: Self-pay | Admitting: Internal Medicine

## 2013-09-06 ENCOUNTER — Ambulatory Visit (INDEPENDENT_AMBULATORY_CARE_PROVIDER_SITE_OTHER): Payer: Medicare Other

## 2013-09-06 ENCOUNTER — Ambulatory Visit (INDEPENDENT_AMBULATORY_CARE_PROVIDER_SITE_OTHER): Payer: Medicare Other | Admitting: Internal Medicine

## 2013-09-06 VITALS — BP 134/82 | HR 92 | Ht 62.0 in | Wt 302.4 lb

## 2013-09-06 DIAGNOSIS — J309 Allergic rhinitis, unspecified: Secondary | ICD-10-CM

## 2013-09-06 DIAGNOSIS — J302 Other seasonal allergic rhinitis: Secondary | ICD-10-CM

## 2013-09-06 DIAGNOSIS — G4733 Obstructive sleep apnea (adult) (pediatric): Secondary | ICD-10-CM

## 2013-09-06 DIAGNOSIS — J3089 Other allergic rhinitis: Secondary | ICD-10-CM
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
## 2013-09-10 ENCOUNTER — Ambulatory Visit (INDEPENDENT_AMBULATORY_CARE_PROVIDER_SITE_OTHER): Payer: Medicare Other | Admitting: Pharmacist Clinician (PhC)/ Clinical Pharmacy Specialist

## 2013-09-10 VITALS — BP 122/74 | HR 72

## 2013-09-10 DIAGNOSIS — I4891 Unspecified atrial fibrillation: Secondary | ICD-10-CM

## 2013-09-10 DIAGNOSIS — I480 Paroxysmal atrial fibrillation: Secondary | ICD-10-CM

## 2013-09-10 DIAGNOSIS — Z7901 Long term (current) use of anticoagulants: Secondary | ICD-10-CM

## 2013-09-10 LAB — POCT INR: INR: 4.1

## 2013-09-12 ENCOUNTER — Ambulatory Visit (INDEPENDENT_AMBULATORY_CARE_PROVIDER_SITE_OTHER): Payer: No Typology Code available for payment source | Admitting: Marriage and Family Therapist

## 2013-09-12 DIAGNOSIS — F4310 Post-traumatic stress disorder, unspecified: Secondary | ICD-10-CM

## 2013-09-12 DIAGNOSIS — F331 Major depressive disorder, recurrent, moderate: Secondary | ICD-10-CM
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
## 2013-09-13 ENCOUNTER — Ambulatory Visit: Payer: Self-pay

## 2013-09-18 ENCOUNTER — Other Ambulatory Visit: Payer: Self-pay | Admitting: Physician Assistant

## 2013-09-18 ENCOUNTER — Other Ambulatory Visit: Payer: Self-pay | Admitting: Cardiovascular Disease

## 2013-09-18 NOTE — Telephone Encounter (Signed)
Rx was sent to pharmacy electronically. 

## 2013-09-24 ENCOUNTER — Ambulatory Visit (INDEPENDENT_AMBULATORY_CARE_PROVIDER_SITE_OTHER): Payer: Medicare Other | Admitting: Pharmacist Clinician (PhC)/ Clinical Pharmacy Specialist

## 2013-09-24 DIAGNOSIS — I4891 Unspecified atrial fibrillation: Secondary | ICD-10-CM

## 2013-09-24 DIAGNOSIS — Z7901 Long term (current) use of anticoagulants: Secondary | ICD-10-CM

## 2013-09-24 DIAGNOSIS — I480 Paroxysmal atrial fibrillation: Secondary | ICD-10-CM

## 2013-09-24 LAB — POCT INR: INR: 2.7

## 2013-09-25 ENCOUNTER — Ambulatory Visit (INDEPENDENT_AMBULATORY_CARE_PROVIDER_SITE_OTHER): Payer: Medicare Other | Admitting: Emergency Medicine

## 2013-09-25 ENCOUNTER — Encounter: Payer: Self-pay | Admitting: Emergency Medicine

## 2013-09-25 ENCOUNTER — Ambulatory Visit (INDEPENDENT_AMBULATORY_CARE_PROVIDER_SITE_OTHER): Payer: No Typology Code available for payment source | Admitting: Marriage and Family Therapist

## 2013-09-25 VITALS — BP 131/79 | HR 72 | Temp 98.2°F | Resp 16 | Ht 62.0 in | Wt 303.0 lb

## 2013-09-25 DIAGNOSIS — I10 Essential (primary) hypertension: Secondary | ICD-10-CM

## 2013-09-25 DIAGNOSIS — E669 Obesity, unspecified: Secondary | ICD-10-CM

## 2013-09-25 DIAGNOSIS — F331 Major depressive disorder, recurrent, moderate: Secondary | ICD-10-CM

## 2013-09-25 DIAGNOSIS — M25569 Pain in unspecified knee: Secondary | ICD-10-CM

## 2013-09-25 DIAGNOSIS — E119 Type 2 diabetes mellitus without complications: Secondary | ICD-10-CM

## 2013-09-25 DIAGNOSIS — F4310 Post-traumatic stress disorder, unspecified: Secondary | ICD-10-CM

## 2013-09-25 LAB — POCT GLYCOSYLATED HEMOGLOBIN (HGB A1C): HEMOGLOBIN A1C: 6.3

## 2013-09-25 LAB — GLUCOSE, POCT (MANUAL RESULT ENTRY): POC Glucose: 106 mg/dl — AB (ref 70–99)

## 2013-09-25 NOTE — Progress Notes (Signed)
Subjective:  This chart was scribed for [name], MD by Mercy Moore, Medial Scribe. This patient was seen in room 22 and the patient's care was started at 10:11 AM.    Patient ID: Michele [name], female    DOB: 13-Jul-1951, 63 y.o.   MRN: [id number]  HPI Michele [name] is a 63 y.o. female is complaining of pain in her lower back and knees after an epidural injection and series of lubricant injections in her knees. Patient states that she will consider going back to the gym for water aerobics to alleviate some pressure on her knees.  Patient briefly mentioned tinnitus in her right ear. Patient reports no swelling in her feet.   Patient states that she is seeing her heart doctor regularly.   Patient Active Problem List   Diagnosis Date Noted  . Hyperlipidemia 07/17/2013  . Chest pain on exertion 12/14/2012  . Long term (current) use of anticoagulants 10/03/2012  . Major depressive disorder, recurrent episode, moderate 09/02/2011  . Menopausal and perimenopausal disorder 05/27/2011  . Depression 05/27/2011  . Urethrocele 05/27/2011  . Anemia, iron deficiency 05/27/2011  . Paroxysmal a-fib 05/27/2011  . Vitamin d deficiency 05/27/2011  . PTSD (post-traumatic stress disorder) 05/27/2011  . Abnormal brain MRI 05/27/2011  . PAD (peripheral artery disease) 05/27/2011  . PFO (patent foramen ovale) 05/27/2011  . Diabetes mellitus type II   . DM 12/16/2009  . 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 SLEEP APNEA, OBSTRUCTIVE 05/10/2007  . HYPERTENSION 05/10/2007  . CORONARY ARTERY DISEASE 05/10/2007  . Seasonal and perennial allergic rhinitis 05/10/2007  . 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 REFLUX, ESOPHAGEAL 05/10/2007   Past Medical History  Diagnosis Date  . Allergic rhinitis   . Asthma   . CAD (coronary artery disease)   . Hypertension   . Sleep apnea   . Diabetes mellitus type II   . Depression   . PTSD (post-traumatic stress disorder)   . 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 Carpal tunnel syndrome, right   . Hyperlipidemia   . Paroxysmal a-fib   . CAD (coronary artery disease) 01/30/2007    stents trivial coronary artery disease diffusely, with a recent deployment od a intracoronary artery stent, 3.5 x 12 mm driver stent with no more than 20-30% in- stents restenosis.done by Dr [name] with re-look on novenber 10 2008 revealing a widely patent stent with otherwise trival CAD and normal LV function  . Chest pain 07/17/12009    2 D Echo EF >55%   Past Surgical History  Procedure Laterality Date  . Cholecystectomy    . Left groin cyst    . Kidney stones    . Stress myocardial perfusion study  09/08/2010    normal; EF 75%  . Multilevel lumbar degenerative disc disease    . Hemorrhoid surgery    . Gallbladder surgery     Allergies  Allergen Reactions  . Meprobamate Nausea And Vomiting   Prior to Admission medications   Medication Sig Start Date End Date Taking? Authorizing Provider  ADVAIR DISKUS 250-50 MCG/DOSE AEPB INHALE 1 PUFF INTO THE LUNGS EVERY 12 HOURS 08/02/13   Deneise Lever, MD  albuterol (PROVENTIL) (2.5 MG/3ML) 0.083% nebulizer solution Take 3 mLs (2.5 mg total) by nebulization every 6 (six) hours as needed. 04/28/11 10/05/13  Deneise Lever, MD  ARIPiprazole (ABILIFY) 15 MG tablet Take 1 tablet (15 mg total) by mouth daily. 07/04/13   Kathlee Nations,  MD  aspirin 81 MG tablet Take 81 mg by mouth daily.      Historical Provider, MD  benztropine (COGENTIN) 0.5 MG tablet Take 1 tablet (0.5 mg total) by mouth at bedtime. 07/04/13   Kathlee Nations, MD  CRESTOR 10 MG tablet TAKE ONE (1) TABLET BY MOUTH EVERY OTHER DAY 08/02/13   [name], MD  diltiazem (CARDIZEM CD) 240 MG 24 hr capsule TAKE ONE CAPSULE BY MOUTH DAILY 06/04/13   Lorretta Harp, MD  fexofenadine (ALLEGRA) 60 MG tablet Take 1 tablet (60 mg total) by mouth daily. Take 60 mg by mouth daily. 12/16/10   Deneise Lever, MD  furosemide (LASIX) 40 MG  tablet TAKE ONE (1) TABLET BY MOUTH EVERY DAY 02/13/13   [name], MD  HYDROcodone-acetaminophen (NORCO) 5-325 MG per tablet Take 1 tablet by mouth every 6 (six) hours as needed.      Historical Provider, MD  isosorbide dinitrate (ISORDIL) 30 MG tablet Take 1 by mouth daily     Historical Provider, MD  isosorbide mononitrate (IMDUR) 30 MG 24 hr tablet TAKE ONE (1) TABLET BY MOUTH EVERY DAY 09/18/13   Lorretta Harp, MD  metFORMIN (GLUCOPHAGE-XR) 500 MG 24 hr tablet TAKE ONE (1) TABLET BY MOUTH EVERY DAY WITH BREAKFAST 08/02/13   [name], MD  metoprolol (LOPRESSOR) 100 MG tablet Take 100 mg by mouth daily. 08/02/13   Historical Provider, MD  mometasone (NASONEX) 50 MCG/ACT nasal spray Place 2 sprays into the nose daily. 10/23/12   Deneise Lever, MD  morphine (MSIR) 15 MG tablet Take 1 by mouth at bedtime      Historical Provider, MD  nitroGLYCERIN (NITROSTAT) 0.4 MG SL tablet Place 1 tablet (0.4 mg total) under the tongue every 5 (five) minutes as needed. 12/14/12   Tarri Fuller, PA-C  pantoprazole (PROTONIX) 20 MG tablet Take 20 mg by mouth daily.    Historical Provider, MD  potassium chloride (KLOR-CON) 20 MEQ packet Take 1 by mouth daily     Historical Provider, MD  sertraline (ZOLOFT) 100 MG tablet Take 2 by mouth once daily 07/04/13   Kathlee Nations, MD  Trospium Chloride 60 MG CP24 TAKE ONE CAPSULE BY MOUTH DAILY    [name], MD  valsartan-hydrochlorothiazide (DIOVAN-HCT) 160-12.5 MG per tablet TAKE ONE (1) TABLET BY MOUTH EVERY DAY 04/10/13   Lorretta Harp, MD  VENTOLIN HFA 108 (90 BASE) MCG/ACT inhaler INHALE TWO PUFFS INTO THE LUNGS EVERY 6 HOURS AS NEEDED 04/12/13   Deneise Lever, MD  warfarin (COUMADIN) 7.5 MG tablet Take 1 & 1/2 to 2 tablets by mouth daily as directed 06/04/13   Tommy Medal, RPH-CPP   History   Social History  . Marital Status: Divorced    Spouse Name: N/A    Number of Children: 2  . Years of Education: N/A   Occupational History  . N[employer]SING [employer]     Social History Main Topics  . Smoking status: Never Smoker   . Smokeless tobacco: Never Used  . Alcohol Use: No  . Drug Use: No  . Sexual Activity: Not on file   Other Topics Concern  . Not on file   Social History Narrative  . No narrative on file       Review of Systems  HENT: Positive for tinnitus.   Musculoskeletal: Positive for arthralgias and back pain.       Objective:   Physical Exam  CONSTITUTIONAL: Well developed/well  nourished HEAD: Normocephalic/atraumatic EYES: EOMI/PERRL ENMT: Mucous membranes moist NECK: supple no meningeal signs SPINE:entire spine nontender CV: S1/S2 noted; grade 2 murmur at the left sternal border  LUNGS: Lungs are clear to auscultation bilaterally, no apparent distress ABDOMEN: soft, nontender, no rebound or guarding GU:no cva tenderness NEURO: Pt is awake/alert, moves all extremitiesx4 EXTREMITIES: degenerative changes at both knees SKIN: warm, color normal PSYCH: no abnormalities of mood noted  Filed Vitals:   09/25/13 0945  BP: 131/79  Pulse: 72  Temp: 98.2 F (36.8 C)  Resp: 16  Height: 5\' 2"  (1.575 m)  Weight: 303 lb (137.44 kg)  SpO2: 95%   Results for orders placed in visit on 09/24/13  POCT INR      Result Value Ref Range   INR 2.7     Results for orders placed in visit on 09/25/13  POCT GLYCOSYLATED HEMOGLOBIN (HGB A1C)      Result Value Ref Range   Hemoglobin A1C 6.3    GLUCOSE, POCT (MANUAL RESULT ENTRY)      Result Value Ref Range   POC Glucose 106 (*) 70 - 99 mg/dl       Assessment & Plan:   Patient is a call at 6.3. We'll make no changes in her medications. Blood pressures at sugar is at goal recheck 3 months
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
## 2013-09-27 ENCOUNTER — Ambulatory Visit (INDEPENDENT_AMBULATORY_CARE_PROVIDER_SITE_OTHER): Payer: Medicare Other

## 2013-09-27 DIAGNOSIS — J309 Allergic rhinitis, unspecified: Secondary | ICD-10-CM

## 2013-09-29 NOTE — Assessment & Plan Note (Signed)
No weight loss and very inconsistent use of CPAP limited by frequent nocturia. 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 Consider oral appliance as an alternative.

## 2013-09-29 NOTE — Assessment & Plan Note (Signed)
Okay to continue allergy vaccine anticipating spring season

## 2013-10-02 ENCOUNTER — Encounter (HOSPITAL_COMMUNITY): Payer: Self-pay | Admitting: Psychiatry

## 2013-10-02 ENCOUNTER — Ambulatory Visit (INDEPENDENT_AMBULATORY_CARE_PROVIDER_SITE_OTHER): Payer: No Typology Code available for payment source | Admitting: Psychiatry

## 2013-10-02 VITALS — BP 134/91 | HR 80 | Wt 303.8 lb

## 2013-10-02 DIAGNOSIS — F329 Major depressive disorder, single episode, unspecified: Secondary | ICD-10-CM

## 2013-10-02 DIAGNOSIS — F4310 Post-traumatic stress disorder, unspecified: Secondary | ICD-10-CM

## 2013-10-02 DIAGNOSIS — F331 Major depressive disorder, recurrent, moderate: Secondary | ICD-10-CM

## 2013-10-02 DIAGNOSIS — F29 Unspecified psychosis not due to a substance or known physiological condition: Secondary | ICD-10-CM

## 2013-10-02 MED ORDER — SERTRALINE HCL 100 MG PO TABS: ORAL_TABLET | ORAL | Status: DC

## 2013-10-02 MED ORDER — BENZTROPINE MESYLATE 0.5 MG PO TABS: 0.5000 mg | ORAL_TABLET | Freq: Every day | ORAL | Status: DC

## 2013-10-02 MED ORDER — ARIPIPRAZOLE 15 MG PO TABS: 15.0000 mg | ORAL_TABLET | Freq: Every day | ORAL | Status: DC
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
## 2013-10-04 ENCOUNTER — Ambulatory Visit: Payer: Self-pay

## 2013-10-09 ENCOUNTER — Ambulatory Visit (INDEPENDENT_AMBULATORY_CARE_PROVIDER_SITE_OTHER): Payer: No Typology Code available for payment source | Admitting: Marriage and Family Therapist

## 2013-10-09 DIAGNOSIS — F331 Major depressive disorder, recurrent, moderate: Secondary | ICD-10-CM

## 2013-10-09 DIAGNOSIS — F4310 Post-traumatic stress disorder, unspecified: Secondary | ICD-10-CM

## 2013-10-09 NOTE — Progress Notes (Signed)
   THERAPIST PROGRESS NOTE  Session Time:  1:00 - 2:00 p.m.  Participation Level: Active  Behavioral Response: CasualAlertDepressed  Type of Therapy: Individual Therapy  Treatment Goals addressed: Coping  Interventions: Strength-based and Supportive  Summary: Michele [name] is a 63 y.o. female African American who presents with depression and PTSD.  She was referred by [name], counselor.  Patient reports she has had a lot of pain this two weeks in her knees and back and that she has received injections for same but has not had relief.  She talked about thinking she is "dependent" on therapy and has not seen much in the way of improvement in how she is dealing with her life because she is not working hard enough.  She talked about being with other therapists and having this feeling at the time of those sessions.  Patient spent the rest of the session talking about going to court with her daughter this week on a custody matter.    Suicidal/Homicidal: Negative  Therapist Response:  Discussed with patient how her depression is triggered by her pain and suggested she do some breathing techniques.  Talked about how patient is not getting more out of therapy.  Since she said she believes she is not working hard enough, this Probation officer talked to her about two issues:  One, acknowledged to patient that she could work harder in therapy.  Gave the example of when she disclosed experiences of trauma, she was asked to "look for the blessings" in those traumatic experiences and report back in session which she did.  Talked about the purpose of that homework assignment was to help her feel differently about the traumas which in time could help memories go from the amygdala to the frontal lobe.  Second, discussed the possibility that her expectations could be too high relating to work in therapy based on having to focus on multiple medical conditions (patient has an asthma attack on the way to the office  and had to use an inhaler).  Will continue this conversation and talk about this writer being able to push patient more.  Plan: Return again in 2 weeks.  Diagnosis: Axis I: Major depressive d/o, moderate; PTSD    Axis II: Deferred    Eydan Chianese, LMFT, CTS 10/09/2013

## 2013-10-12 ENCOUNTER — Encounter: Payer: Self-pay | Admitting: Internal Medicine

## 2013-10-12 ENCOUNTER — Ambulatory Visit (INDEPENDENT_AMBULATORY_CARE_PROVIDER_SITE_OTHER): Payer: Medicare Other

## 2013-10-12 DIAGNOSIS — J309 Allergic rhinitis, unspecified: Secondary | ICD-10-CM

## 2013-10-18 ENCOUNTER — Ambulatory Visit (INDEPENDENT_AMBULATORY_CARE_PROVIDER_SITE_OTHER): Payer: Medicare Other

## 2013-10-18 DIAGNOSIS — J309 Allergic rhinitis, unspecified: Secondary | ICD-10-CM

## 2013-10-22 ENCOUNTER — Encounter: Payer: Self-pay | Admitting: Internal Medicine

## 2013-10-22 ENCOUNTER — Ambulatory Visit (INDEPENDENT_AMBULATORY_CARE_PROVIDER_SITE_OTHER): Payer: Medicare Other | Admitting: Pharmacist Clinician (PhC)/ Clinical Pharmacy Specialist

## 2013-10-22 ENCOUNTER — Ambulatory Visit (HOSPITAL_BASED_OUTPATIENT_CLINIC_OR_DEPARTMENT_OTHER): Payer: Medicare Other | Admitting: Internal Medicine

## 2013-10-22 ENCOUNTER — Telehealth: Payer: Self-pay | Admitting: Internal Medicine

## 2013-10-22 ENCOUNTER — Other Ambulatory Visit (HOSPITAL_BASED_OUTPATIENT_CLINIC_OR_DEPARTMENT_OTHER): Payer: Medicare Other

## 2013-10-22 VITALS — BP 144/70 | HR 74 | Temp 98.4°F | Resp 18 | Ht 62.0 in | Wt 304.8 lb

## 2013-10-22 DIAGNOSIS — I4891 Unspecified atrial fibrillation: Secondary | ICD-10-CM

## 2013-10-22 DIAGNOSIS — I480 Paroxysmal atrial fibrillation: Secondary | ICD-10-CM

## 2013-10-22 DIAGNOSIS — D509 Iron deficiency anemia, unspecified: Secondary | ICD-10-CM

## 2013-10-22 DIAGNOSIS — Z7901 Long term (current) use of anticoagulants: Secondary | ICD-10-CM

## 2013-10-22 LAB — FERRITIN CHCC: Ferritin: 248 ng/ml (ref 9–269)

## 2013-10-22 LAB — CBC WITH DIFFERENTIAL/PLATELET
BASO%: 2.7 % — AB (ref 0.0–2.0)
Basophils Absolute: 0.2 10*3/uL — ABNORMAL HIGH (ref 0.0–0.1)
EOS%: 2.7 % (ref 0.0–7.0)
Eosinophils Absolute: 0.2 10*3/uL (ref 0.0–0.5)
HCT: 34.8 % (ref 34.8–46.6)
HGB: 10.9 g/dL — ABNORMAL LOW (ref 11.6–15.9)
LYMPH%: 40.5 % (ref 14.0–49.7)
MCH: 21.4 pg — AB (ref 25.1–34.0)
MCHC: 31.2 g/dL — AB (ref 31.5–36.0)
MCV: 68.8 fL — ABNORMAL LOW (ref 79.5–101.0)
MONO#: 0.7 10*3/uL (ref 0.1–0.9)
MONO%: 9 % (ref 0.0–14.0)
NEUT#: 3.4 10*3/uL (ref 1.5–6.5)
NEUT%: 45.1 % (ref 38.4–76.8)
Platelets: 415 10*3/uL — ABNORMAL HIGH (ref 145–400)
RBC: 5.07 10*6/uL (ref 3.70–5.45)
RDW: 18.6 % — AB (ref 11.2–14.5)
WBC: 7.4 10*3/uL (ref 3.9–10.3)
lymph#: 3 10*3/uL (ref 0.9–3.3)

## 2013-10-22 LAB — IRON AND TIBC CHCC
%SAT: 20 % — ABNORMAL LOW (ref 21–57)
IRON: 57 ug/dL (ref 41–142)
TIBC: 279 ug/dL (ref 236–444)
UIBC: 222 ug/dL (ref 120–384)

## 2013-10-22 LAB — POCT INR: INR: 2.7

## 2013-10-22 NOTE — Progress Notes (Signed)
[facility] Telephone:(336) [phone]   Fax:(336) [phone]  OFFICE PROGRESS NOTE  [name], MD [address] [place] [address]  DIAGNOSIS: Unspecified anemia questionable for anemia of chronic disease/iron deficiency .   PRIOR THERAPY: None   CURRENT THERAPY: Observation.  INTERVAL HISTORY: BETHANIA SCHLOTZHAUER 63 y.o. female returns to the clinic today for six-month followup visit. The patient is feeling fine today with no specific complaints except for mild fatigue and occasional dizzy spells. She denied having any bleeding, bruises or ecchymosis. The patient denied having any significant chest pain, shortness of breath, cough or hemoptysis. She denied having any significant weight loss or night sweats. She had repeat CBC and iron study performed recently and she is here for evaluation and discussion of her lab results.   MEDICAL HISTORY: Past Medical History  Diagnosis Date  . Allergic rhinitis   . Asthma   . CAD (coronary artery disease)   . Hypertension   . Sleep apnea   . Diabetes mellitus type II   . Depression   . PTSD (post-traumatic stress disorder)   . 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 Carpal tunnel syndrome, right   . Hyperlipidemia   . Paroxysmal a-fib   . CAD (coronary artery disease) 01/30/2007    stents trivial coronary artery disease diffusely, with a recent deployment od a intracoronary artery stent, 3.5 x 12 mm driver stent with no more than 20-30% in- stents restenosis.done by Dr [name] with re-look on novenber 10 2008 revealing a widely patent stent with otherwise trival CAD and normal LV function  . Chest pain 07/17/12009    2 D Echo EF >55%    ALLERGIES:  is allergic to meprobamate.  MEDICATIONS:  Current Outpatient Prescriptions  Medication Sig Dispense Refill  . ADVAIR DISKUS 250-50 MCG/DOSE AEPB INHALE 1 PUFF INTO THE LUNGS EVERY 12 HOURS  60 each  3  . albuterol (PROVENTIL) (2.5 MG/3ML) 0.083%  nebulizer solution Take 3 mLs (2.5 mg total) by nebulization every 6 (six) hours as needed.  360 mL  2  . ARIPiprazole (ABILIFY) 15 MG tablet Take 1 tablet (15 mg total) by mouth daily.  30 tablet  2  . aspirin 81 MG tablet Take 81 mg by mouth daily.        . benztropine (COGENTIN) 0.5 MG tablet Take 1 tablet (0.5 mg total) by mouth at bedtime.  30 tablet  2  . CRESTOR 10 MG tablet TAKE ONE (1) TABLET BY MOUTH EVERY OTHER DAY  90 tablet  1  . diltiazem (CARDIZEM CD) 240 MG 24 hr capsule TAKE ONE CAPSULE BY MOUTH DAILY  30 capsule  7  . fexofenadine (ALLEGRA) 60 MG tablet Take 1 tablet (60 mg total) by mouth daily. Take 60 mg by mouth daily.  30 tablet  11  . furosemide (LASIX) 40 MG tablet TAKE ONE (1) TABLET BY MOUTH EVERY DAY  30 tablet  4  . HYDROcodone-acetaminophen (NORCO) 5-325 MG per tablet Take 1 tablet by mouth every 6 (six) hours as needed.        . isosorbide dinitrate (ISORDIL) 30 MG tablet Take 1 by mouth daily       . isosorbide mononitrate (IMDUR) 30 MG 24 hr tablet TAKE ONE (1) TABLET BY MOUTH EVERY DAY  90 tablet  2  . metFORMIN (GLUCOPHAGE-XR) 500 MG 24 hr tablet TAKE ONE (1) TABLET BY MOUTH EVERY DAY WITH BREAKFAST  90 tablet  1  . metoprolol (LOPRESSOR) 100 MG tablet Take 100 mg by mouth daily.      . mometasone (NASONEX) 50 MCG/ACT nasal spray Place 2 sprays into the nose daily.  17 g  2  . morphine (MSIR) 15 MG tablet Take 1 by mouth at bedtime        . nitroGLYCERIN (NITROSTAT) 0.4 MG SL tablet Place 1 tablet (0.4 mg total) under the tongue every 5 (five) minutes as needed.  25 tablet  3  . pantoprazole (PROTONIX) 20 MG tablet Take 20 mg by mouth daily.      . potassium chloride (KLOR-CON) 20 MEQ packet Take 1 by mouth daily       . sertraline (ZOLOFT) 100 MG tablet Take 2 by mouth once daily  60 tablet  2  . Trospium Chloride 60 MG CP24 TAKE ONE CAPSULE BY MOUTH DAILY  30 each  5  . valsartan-hydrochlorothiazide (DIOVAN-HCT) 160-12.5 MG per tablet TAKE ONE (1) TABLET BY  MOUTH EVERY DAY  30 tablet  9  . VENTOLIN HFA 108 (90 BASE) MCG/ACT inhaler INHALE TWO PUFFS INTO THE LUNGS EVERY 6 HOURS AS NEEDED  18 g  4  . warfarin (COUMADIN) 7.5 MG tablet Take 1 & 1/2 to 2 tablets by mouth daily as directed  55 tablet  5  . [DISCONTINUED] SANCTURA XR 60 MG CP24 Take 1 capsule by mouth daily.       No current facility-administered medications for this visit.    SURGICAL HISTORY:  Past Surgical History  Procedure Laterality Date  . Cholecystectomy    . Left groin cyst    . Kidney stones    . Stress myocardial perfusion study  09/08/2010    normal; EF 75%  . Multilevel lumbar degenerative disc disease    . Hemorrhoid surgery    . Gallbladder surgery      REVIEW OF SYSTEMS:  A comprehensive review of systems was negative except for: Constitutional: positive for fatigue   PHYSICAL EXAMINATION: General appearance: alert, cooperative, fatigued and no distress Head: Normocephalic, without obvious abnormality, atraumatic Neck: no adenopathy, no JVD, supple, symmetrical, trachea midline and thyroid not enlarged, symmetric, no tenderness/mass/nodules Lymph nodes: Cervical, supraclavicular, and axillary nodes normal. Resp: clear to auscultation bilaterally Back: symmetric, no curvature. ROM normal. No CVA tenderness. Cardio: regular rate and rhythm, S1, S2 normal, no murmur, click, rub or gallop GI: soft, non-tender; bowel sounds normal; no masses,  no organomegaly Extremities: extremities normal, atraumatic, no cyanosis or edema  ECOG PERFORMANCE STATUS: 1 - Symptomatic but completely ambulatory  Blood pressure 144/70, pulse 74, temperature 98.4 F (36.9 C), temperature source Oral, resp. rate 18, height 5\' 2"  (1.575 m), weight 304 lb 12.8 oz (138.256 kg).  LABORATORY DATA: Lab Results  Component Value Date   WBC 7.4 10/22/2013   HGB 10.9* 10/22/2013   HCT 34.8 10/22/2013   MCV 68.8* 10/22/2013   PLT 415* 10/22/2013      Chemistry      Component Value Date/Time    NA 144 10/02/2012 0905   NA 140 01/07/2012 1458   K 3.4* 10/02/2012 0905   K 3.6 01/07/2012 1458   CL 101 10/02/2012 0905   CL 100 01/07/2012 1458   CO2 31* 10/02/2012 0905   CO2 33* 01/07/2012 1458   BUN 13.9 10/02/2012 0905   BUN 14 01/07/2012 1458   CREATININE 0.8 10/02/2012 0905   CREATININE 0.76 01/07/2012 1458      Component Value Date/Time  CALCIUM 9.9 10/02/2012 0905   CALCIUM 9.8 01/07/2012 1458   ALKPHOS 89 07/27/2013 1332   AST 16 07/27/2013 1332   ALT 15 07/27/2013 1332   BILITOT 0.5 07/27/2013 1332     Other lab result: Still pending.  RADIOGRAPHIC STUDIES: No results found.  ASSESSMENT AND PLAN: This is a very pleasant 63 years old [place] American female with history of persistent anemia questionable for anemia of chronic disease plus/minus iron deficiency. Her hemoglobin and hematocrit has been stable over the last several months and iron studies are still pending. If the iron study showed iron deficiency, I will arrange for the patient to received intravenous Feraheme infusion. She would come back for followup visit in 3 months with repeat CBC and iron study. She was advised to call immediately if she has any concerning symptoms in the interval.  The patient voices understanding of current disease status and treatment options and is in agreement with the current care plan.  All questions were answered. The patient knows to call the clinic with any problems, questions or concerns. We can certainly see the patient much sooner if necessary.  Disclaimer: This note was dictated with voice recognition software. Similar sounding words can inadvertently be transcribed and may not be corrected upon review.

## 2013-10-22 NOTE — Telephone Encounter (Signed)
Gave pt appt for lab and MD for July 2015 today

## 2013-10-23 ENCOUNTER — Telehealth: Payer: Self-pay | Admitting: *Deleted

## 2013-10-23 ENCOUNTER — Ambulatory Visit (INDEPENDENT_AMBULATORY_CARE_PROVIDER_SITE_OTHER): Payer: No Typology Code available for payment source | Admitting: Marriage and Family Therapist

## 2013-10-23 DIAGNOSIS — F331 Major depressive disorder, recurrent, moderate: Secondary | ICD-10-CM

## 2013-10-23 DIAGNOSIS — F4310 Post-traumatic stress disorder, unspecified: Secondary | ICD-10-CM

## 2013-10-23 NOTE — Progress Notes (Signed)
   THERAPIST PROGRESS NOTE  Session Time:  1:00 - 2:00 p.m.  Participation Level: Active  Behavioral Response: CasualAlertDepressed (mioderate)  Type of Therapy: Individual Therapy  Treatment Goals addressed: Coping  Interventions: Strength-based and Supportive/reframing/CBT  Summary: Michele [name] is a 63 y.o. female African American who presents with depression and PTSD. She was referred by [name], counselor.  Patient reports she does know what it is like to experience trying to cope with her daily life and live with what happened to her in the past.  She reports not focusing on therapy because she was so busy with multiple doctor's appointments.  She talked about having "multiple things on her mind" regularly and does not know how to "only have two or three things on my mind."  She talked about this being in part because "every day I hear from all my sisters and brothers and what is happening with them."  She reports her oldest sister has been losing a lot of weight without trying and she is concerned about her sister having serious medical conditions.  She talked about "not being Godlike" over a man approaching her vebally, seeming interested in her, versus "being afraid to get involved with anyone."    Suicidal/Homicidal: Negative  Therapist Response:  Did assess patient's depression.  She reported that last week she was "having some depression" even though this Probation officer used reframing to help patient identify she was "only having some depression" compared to the last two weeks.  Talked about her doctors appointments keeping her busy distracting her from feeling depressed.  Talked about part of her treatment plan using cognitive restructuring relating to her fears about her past and present as well as teaching her to view her life from a more realistic angle.  Gave patient homework assignment to find ways she can feel more jovial in the next two weeks in order again to reframe how  she views her life.  Plan: Return again in 2 weeks.  Diagnosis: Axis I: Major depressive d/o, moderate; PTSD    Axis II: Deferred    Larysa Pall, LMFT, CTS 10/23/2013

## 2013-10-23 NOTE — Telephone Encounter (Signed)
Pt called wanting to know results of the iron study.  Per Dr Vista Mink, iron study looks good, she can return as originally planned for in July.  Pt verbalized understanding.  SLJ

## 2013-10-25 ENCOUNTER — Ambulatory Visit: Payer: Self-pay

## 2013-11-02 ENCOUNTER — Ambulatory Visit (INDEPENDENT_AMBULATORY_CARE_PROVIDER_SITE_OTHER): Payer: Medicare Other

## 2013-11-02 DIAGNOSIS — J309 Allergic rhinitis, unspecified: Secondary | ICD-10-CM

## 2013-11-06 ENCOUNTER — Other Ambulatory Visit: Payer: Self-pay

## 2013-11-06 ENCOUNTER — Other Ambulatory Visit: Payer: Self-pay | Admitting: Internal Medicine

## 2013-11-06 MED ORDER — VALSARTAN-HYDROCHLOROTHIAZIDE 160-12.5 MG PO TABS: 1.0000 | ORAL_TABLET | Freq: Every day | ORAL | Status: DC

## 2013-11-06 MED ORDER — DILTIAZEM HCL ER COATED BEADS 240 MG PO CP24: 240.0000 mg | ORAL_CAPSULE | Freq: Every day | ORAL | Status: DC

## 2013-11-06 MED ORDER — FLUTICASONE-SALMETEROL 250-50 MCG/DOSE IN AEPB: INHALATION_SPRAY | RESPIRATORY_TRACT | Status: DC

## 2013-11-06 MED ORDER — TROSPIUM CHLORIDE ER 60 MG PO CP24: ORAL_CAPSULE | ORAL | Status: DC

## 2013-11-06 NOTE — Telephone Encounter (Signed)
Received request for refill.  Patient would like 90 day supply.

## 2013-11-06 NOTE — Telephone Encounter (Signed)
Rx was sent to pharmacy electronically. 

## 2013-11-07 ENCOUNTER — Other Ambulatory Visit (HOSPITAL_COMMUNITY): Payer: Self-pay | Admitting: *Deleted

## 2013-11-07 DIAGNOSIS — F331 Major depressive disorder, recurrent, moderate: Secondary | ICD-10-CM

## 2013-11-07 MED ORDER — ARIPIPRAZOLE 15 MG PO TABS: 15.0000 mg | ORAL_TABLET | Freq: Every day | ORAL | Status: DC

## 2013-11-07 MED ORDER — SERTRALINE HCL 100 MG PO TABS: ORAL_TABLET | ORAL | Status: DC

## 2013-11-08 ENCOUNTER — Ambulatory Visit (INDEPENDENT_AMBULATORY_CARE_PROVIDER_SITE_OTHER): Payer: Medicare Other

## 2013-11-08 ENCOUNTER — Ambulatory Visit (INDEPENDENT_AMBULATORY_CARE_PROVIDER_SITE_OTHER): Payer: No Typology Code available for payment source | Admitting: Marriage and Family Therapist

## 2013-11-08 DIAGNOSIS — J309 Allergic rhinitis, unspecified: Secondary | ICD-10-CM

## 2013-11-08 DIAGNOSIS — F4310 Post-traumatic stress disorder, unspecified: Secondary | ICD-10-CM

## 2013-11-08 DIAGNOSIS — F331 Major depressive disorder, recurrent, moderate: Secondary | ICD-10-CM

## 2013-11-08 NOTE — Progress Notes (Signed)
   THERAPIST PROGRESS NOTE  Session Time:  1:00 - 2:00 p.m.  Participation Level: Active  Behavioral Response: CasualAlertDepressed (minimal)  Type of Therapy: Individual Therapy  Treatment Goals addressed: Coping  Interventions: Strength-based and Supportive  Summary: Michele [name] is a 63 y.o. female African American who presents with depression and PTSD.  She was referred by [name], counselor. Patient came in she was "using her positive juices."  She states she was working on how to remain positive as requested in the sessions.  She reports it has been working for her. She also report she has been feeling less pain in her knees since the shots.  Patient also talked about how she sustains herself mentally through her relationships with her siblings.  She talked about them making conferences calls where they prayed together, had breakfast and tea together.  She reports they have been doing this for years as did her grandmothers on her father's side.  Suicidal/Homicidal: Negative  Therapist Response:   Patient was very positive today for the first time since starting therapy with this Probation officer.  Reaffirmed the impact in a positive way her relationships with her siblings has had on her and she was able to state "they sustain me."  Talked about other ways to remain positive e.g., after the talks to allow in the positive feelings from the talks.  Reminded patient that this writer will be on vacation and what to do in an emergency e.g., call 911, go to her emergency room or contact [facility].  Patient stated, "I don't think I will need that, but now I know."  Plan: Return again in 1 month..  Diagnosis: Axis I: Major depressive d/o, moderate; PTSD    Axis II: Deferred    Michele Longo, LMFT, CTS 11/08/2013

## 2013-11-16 ENCOUNTER — Ambulatory Visit (INDEPENDENT_AMBULATORY_CARE_PROVIDER_SITE_OTHER): Payer: Medicare Other

## 2013-11-16 DIAGNOSIS — J309 Allergic rhinitis, unspecified: Secondary | ICD-10-CM

## 2013-11-21 ENCOUNTER — Ambulatory Visit (INDEPENDENT_AMBULATORY_CARE_PROVIDER_SITE_OTHER): Payer: Medicare Other | Admitting: Pharmacist Clinician (PhC)/ Clinical Pharmacy Specialist

## 2013-11-21 DIAGNOSIS — Z7901 Long term (current) use of anticoagulants: Secondary | ICD-10-CM

## 2013-11-21 DIAGNOSIS — I480 Paroxysmal atrial fibrillation: Secondary | ICD-10-CM

## 2013-11-21 DIAGNOSIS — I4891 Unspecified atrial fibrillation: Secondary | ICD-10-CM

## 2013-11-21 LAB — POCT INR: INR: 2.8

## 2013-11-21 MED ORDER — WARFARIN SODIUM 7.5 MG PO TABS: ORAL_TABLET | ORAL | Status: DC

## 2013-11-22 ENCOUNTER — Ambulatory Visit: Payer: Self-pay

## 2013-12-06 ENCOUNTER — Ambulatory Visit (INDEPENDENT_AMBULATORY_CARE_PROVIDER_SITE_OTHER): Payer: No Typology Code available for payment source | Admitting: Marriage and Family Therapist

## 2013-12-06 ENCOUNTER — Ambulatory Visit (INDEPENDENT_AMBULATORY_CARE_PROVIDER_SITE_OTHER): Payer: Medicare Other

## 2013-12-06 DIAGNOSIS — J309 Allergic rhinitis, unspecified: Secondary | ICD-10-CM

## 2013-12-06 DIAGNOSIS — F331 Major depressive disorder, recurrent, moderate: Secondary | ICD-10-CM

## 2013-12-06 DIAGNOSIS — F4310 Post-traumatic stress disorder, unspecified: Secondary | ICD-10-CM

## 2013-12-06 NOTE — Progress Notes (Signed)
   THERAPIST PROGRESS NOTE  Session Time:  1:00 - 2:00 p.m.  Participation Level: Active  Behavioral Response: CasualAlertDepressed (moderate)  Type of Therapy: Individual Therapy  Treatment Goals addressed: Coping  Interventions: Strength-based and Supportive  Summary: Michele [name] is a 63 y.o. female African American who presents with depression and PTSD.  She was referred by [name], counselor.  Patient reports she has had some moments of "melancholy."  She reports she does no know why, except that her oldest grandchild got arrested for underage drinking at college.  She states she has been upset by that and because his mother is also upset, patient has had her grandson staying with her.  She reports it has put more activity in her life because the police took his license and she has been driving him around.  She reports wanting to find more balance in her life versus "feeling like all I'm doing is going to doctor's appointments."  Patient did say her 5 year high-school reunion is in July and she wants to go.  Suicidal/Homicidal: NA  Therapist Response:   Discussed what was causing melancholy.  With discussion patient was able to identify that she was only feeling it "sometimes" and was good most of the time.  She admitted she was "working to remain positive."  Talked about how to balance her life which [facility] and [facility] out a scholarship form to find out how much she would have to pay for a membership.  Talked about this Probation officer retiring, processed information and discussed referrals.  Will see in two weeks for termination.  Plan: Return again in 2 weeks.  Diagnosis: Axis I: major depressive d/o, moderate; PTSD    Axis II: Deferred    Jensen Kilburg, LMFT, CTS 12/06/2013

## 2013-12-13 ENCOUNTER — Ambulatory Visit (INDEPENDENT_AMBULATORY_CARE_PROVIDER_SITE_OTHER): Payer: Medicare Other

## 2013-12-13 DIAGNOSIS — J309 Allergic rhinitis, unspecified: Secondary | ICD-10-CM

## 2013-12-19 ENCOUNTER — Ambulatory Visit (INDEPENDENT_AMBULATORY_CARE_PROVIDER_SITE_OTHER): Payer: Medicare Other | Admitting: Pharmacist Clinician (PhC)/ Clinical Pharmacy Specialist

## 2013-12-19 ENCOUNTER — Ambulatory Visit: Payer: Self-pay | Admitting: Pharmacist Clinician (PhC)/ Clinical Pharmacy Specialist

## 2013-12-19 DIAGNOSIS — I4891 Unspecified atrial fibrillation: Secondary | ICD-10-CM

## 2013-12-19 DIAGNOSIS — Z7901 Long term (current) use of anticoagulants: Secondary | ICD-10-CM

## 2013-12-19 DIAGNOSIS — I480 Paroxysmal atrial fibrillation: Secondary | ICD-10-CM

## 2013-12-19 LAB — POCT INR: INR: 1.6

## 2013-12-20 ENCOUNTER — Ambulatory Visit (INDEPENDENT_AMBULATORY_CARE_PROVIDER_SITE_OTHER): Payer: Medicare Other

## 2013-12-20 ENCOUNTER — Ambulatory Visit (INDEPENDENT_AMBULATORY_CARE_PROVIDER_SITE_OTHER): Payer: No Typology Code available for payment source | Admitting: Marriage and Family Therapist

## 2013-12-20 DIAGNOSIS — F4310 Post-traumatic stress disorder, unspecified: Secondary | ICD-10-CM

## 2013-12-20 DIAGNOSIS — F331 Major depressive disorder, recurrent, moderate: Secondary | ICD-10-CM

## 2013-12-20 DIAGNOSIS — J309 Allergic rhinitis, unspecified: Secondary | ICD-10-CM

## 2013-12-20 NOTE — Progress Notes (Signed)
   THERAPIST PROGRESS NOTE  Session Time:  11:00 - Noon  Participation Level: Active  Behavioral Response: CasualAlertDepressed  Type of Therapy: Individual Therapy  Treatment Goals addressed: Coping  Interventions: Strength-based and Supportive/Termination  Summary: Michele [name] is a 63 y.o. female African American who presents with depression and PTSD.  She was referred by [name], Counselor.   Patient states she has continued to work on thinking more positive and to identify "what the blessings" were relating to trauma she has experienced in the past.  She admits knowing she has been making progress "because I can feel it."    Suicidal/Homicidal: NA  Therapist Response:   Discussed with patient her process through her treatment plan.  Talked about what patient can do to continue working as she has been working with [name] (patient requested going back to [name] for counseling).  Specifically discussed patient "being stuck in the past for years (her words) and how she has not been as honest as she could have been working with Joslyn Devon in the past.  Suggested she be honest with [name] when she meets with her.  Continued to process termination.  Did give patient seven more referrals in case she decided to go elsewhere.  Plan: Return again in 0 weeks.  Diagnosis: Axis I: Major depressive d/o, moderate; PTSD    Axis II: Deferred    Tyjon Bowen, LMFT, CTS 12/20/2013

## 2013-12-25 ENCOUNTER — Other Ambulatory Visit: Payer: Self-pay | Admitting: Emergency Medicine

## 2013-12-27 ENCOUNTER — Ambulatory Visit: Payer: Medicare Other

## 2013-12-28 ENCOUNTER — Ambulatory Visit (INDEPENDENT_AMBULATORY_CARE_PROVIDER_SITE_OTHER): Payer: Medicare Other

## 2013-12-28 DIAGNOSIS — J309 Allergic rhinitis, unspecified: Secondary | ICD-10-CM

## 2014-01-03 ENCOUNTER — Ambulatory Visit (INDEPENDENT_AMBULATORY_CARE_PROVIDER_SITE_OTHER): Payer: No Typology Code available for payment source | Admitting: Psychiatry

## 2014-01-03 ENCOUNTER — Encounter (HOSPITAL_COMMUNITY): Payer: Self-pay | Admitting: Psychiatry

## 2014-01-03 ENCOUNTER — Ambulatory Visit (INDEPENDENT_AMBULATORY_CARE_PROVIDER_SITE_OTHER): Payer: Medicare Other

## 2014-01-03 VITALS — BP 122/82 | HR 88 | Ht 62.0 in | Wt 306.2 lb

## 2014-01-03 DIAGNOSIS — F331 Major depressive disorder, recurrent, moderate: Secondary | ICD-10-CM

## 2014-01-03 DIAGNOSIS — F4310 Post-traumatic stress disorder, unspecified: Secondary | ICD-10-CM

## 2014-01-03 DIAGNOSIS — F29 Unspecified psychosis not due to a substance or known physiological condition: Secondary | ICD-10-CM

## 2014-01-03 DIAGNOSIS — J309 Allergic rhinitis, unspecified: Secondary | ICD-10-CM

## 2014-01-03 DIAGNOSIS — F329 Major depressive disorder, single episode, unspecified: Secondary | ICD-10-CM

## 2014-01-03 MED ORDER — ARIPIPRAZOLE 15 MG PO TABS: 15.0000 mg | ORAL_TABLET | Freq: Every day | ORAL | Status: DC

## 2014-01-03 MED ORDER — BENZTROPINE MESYLATE 0.5 MG PO TABS: 0.5000 mg | ORAL_TABLET | Freq: Every day | ORAL | Status: DC

## 2014-01-03 MED ORDER — SERTRALINE HCL 100 MG PO TABS: ORAL_TABLET | ORAL | Status: DC
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
## 2014-01-08 ENCOUNTER — Encounter: Payer: Self-pay | Admitting: Emergency Medicine

## 2014-01-08 ENCOUNTER — Ambulatory Visit (INDEPENDENT_AMBULATORY_CARE_PROVIDER_SITE_OTHER): Payer: Medicare Other | Admitting: Emergency Medicine

## 2014-01-08 ENCOUNTER — Telehealth: Payer: Self-pay | Admitting: Radiology

## 2014-01-08 VITALS — BP 136/85 | HR 81 | Temp 98.1°F | Resp 16 | Ht 62.5 in | Wt 307.0 lb

## 2014-01-08 DIAGNOSIS — E119 Type 2 diabetes mellitus without complications: Secondary | ICD-10-CM

## 2014-01-08 DIAGNOSIS — R5381 Other malaise: Secondary | ICD-10-CM

## 2014-01-08 DIAGNOSIS — R5383 Other fatigue: Secondary | ICD-10-CM

## 2014-01-08 DIAGNOSIS — E669 Obesity, unspecified: Secondary | ICD-10-CM

## 2014-01-08 DIAGNOSIS — I10 Essential (primary) hypertension: Secondary | ICD-10-CM

## 2014-01-08 DIAGNOSIS — D649 Anemia, unspecified: Secondary | ICD-10-CM

## 2014-01-08 LAB — CBC WITH DIFFERENTIAL/PLATELET
Basophils Absolute: 0.1 10*3/uL (ref 0.0–0.1)
Basophils Relative: 1 % (ref 0–1)
EOS PCT: 2 % (ref 0–5)
Eosinophils Absolute: 0.1 10*3/uL (ref 0.0–0.7)
HEMATOCRIT: 35.3 % — AB (ref 36.0–46.0)
Hemoglobin: 11.2 g/dL — ABNORMAL LOW (ref 12.0–15.0)
LYMPHS ABS: 2.2 10*3/uL (ref 0.7–4.0)
Lymphocytes Relative: 36 % (ref 12–46)
MCH: 21.3 pg — ABNORMAL LOW (ref 26.0–34.0)
MCHC: 31.7 g/dL (ref 30.0–36.0)
MCV: 67.1 fL — AB (ref 78.0–100.0)
MONO ABS: 0.4 10*3/uL (ref 0.1–1.0)
Monocytes Relative: 7 % (ref 3–12)
Neutro Abs: 3.3 10*3/uL (ref 1.7–7.7)
Neutrophils Relative %: 54 % (ref 43–77)
PLATELETS: 368 10*3/uL (ref 150–400)
RBC: 5.26 MIL/uL — AB (ref 3.87–5.11)
RDW: 17.8 % — ABNORMAL HIGH (ref 11.5–15.5)
WBC: 6.1 10*3/uL (ref 4.0–10.5)

## 2014-01-08 LAB — COMPLETE METABOLIC PANEL WITH GFR
ALBUMIN: 4.2 g/dL (ref 3.5–5.2)
ALT: 19 U/L (ref 0–35)
AST: 17 U/L (ref 0–37)
Alkaline Phosphatase: 86 U/L (ref 39–117)
BUN: 12 mg/dL (ref 6–23)
CALCIUM: 9.7 mg/dL (ref 8.4–10.5)
CO2: 31 meq/L (ref 19–32)
CREATININE: 0.71 mg/dL (ref 0.50–1.10)
Chloride: 101 mEq/L (ref 96–112)
GFR, Est African American: >89 mL/min
GFR, Est Non African American: >89 mL/min
Glucose, Bld: 97 mg/dL (ref 70–99)
Potassium: 4.2 mEq/L (ref 3.5–5.3)
Sodium: 142 mEq/L (ref 135–145)
Total Bilirubin: 0.5 mg/dL (ref 0.2–1.2)
Total Protein: 7.4 g/dL (ref 6.0–8.3)

## 2014-01-08 LAB — GLUCOSE, POCT (MANUAL RESULT ENTRY): POC Glucose: 112 mg/dl — AB (ref 70–99)

## 2014-01-08 LAB — POCT GLYCOSYLATED HEMOGLOBIN (HGB A1C): HEMOGLOBIN A1C: 6.1

## 2014-01-08 NOTE — Telephone Encounter (Signed)
I have sent an email to Lillia Mountain to see if he knows anyone who can help with this patient. She does not qualify for [facility] nor does she qualify for the home health nursing eval.

## 2014-01-08 NOTE — Progress Notes (Signed)
   Subjective:    Patient ID: Michele [name], female    DOB: 11/27/1950, 63 y.o.   MRN: [id number]  HPI patient is here with her daughter. They have multiple concerns. Daughter is concerned about her mental status and dad she is not putting in an effort to get better. She has not been exercising she has been overeating. She has seen multiple specialists and the daughter's concern there is a problem with her medications. She has regular visits with Dr. Gwenlyn Found her cardiologist Dr. Annamaria Boots her pulmonary specialist and Dr. Steele Berg psychiatric physician.    Review of Systems     Objective:   Physical Exam patient is overweight no distress neck supple chest clear heart was regular rate abdomen was grossly obese extremities have 1+ edema   Results for orders placed in visit on 01/08/14  GLUCOSE, POCT (MANUAL RESULT ENTRY)      Result Value Ref Range   POC Glucose 112 (*) 70 - 99 mg/dl  POCT GLYCOSYLATED HEMOGLOBIN (HGB A1C)      Result Value Ref Range   Hemoglobin A1C 6.1          Assessment & Plan:  Diabetes is under good control. She is totally deconditioned. I recommended she restart Silver sneakers. We'll see if someone is available to do a medication review. She is bothered by incontinence. She has a GI specialist and also a urologist to try to help her with that situation. I told her she would have to get her Lasix filled by Dr. Gwenlyn Found

## 2014-01-09 ENCOUNTER — Telehealth: Payer: Self-pay

## 2014-01-09 LAB — TSH: TSH: 1.842 u[IU]/mL (ref 0.350–4.500)

## 2014-01-09 NOTE — Telephone Encounter (Signed)
Patient called and states she received a call from [place] but the person did not leave a message. Please return call and advise. Thank you!

## 2014-01-09 NOTE — Telephone Encounter (Signed)
Pt called about lab results. Sent to Lab pool

## 2014-01-10 ENCOUNTER — Ambulatory Visit (INDEPENDENT_AMBULATORY_CARE_PROVIDER_SITE_OTHER): Payer: Medicare Other

## 2014-01-10 DIAGNOSIS — J309 Allergic rhinitis, unspecified: Secondary | ICD-10-CM

## 2014-01-11 NOTE — Telephone Encounter (Signed)
LMOM for pt to cb °

## 2014-01-11 NOTE — Telephone Encounter (Signed)
Call advise she discuss this with pharmacist.

## 2014-01-11 NOTE — Telephone Encounter (Signed)
There is not a pharm D at [facility], perhaps her pharmacist can assist [place] with this.

## 2014-01-14 NOTE — Telephone Encounter (Signed)
Spoke to pt, she is aware 

## 2014-01-15 ENCOUNTER — Ambulatory Visit (INDEPENDENT_AMBULATORY_CARE_PROVIDER_SITE_OTHER): Payer: Medicare Other

## 2014-01-15 ENCOUNTER — Telehealth: Payer: Self-pay | Admitting: Internal Medicine

## 2014-01-15 ENCOUNTER — Ambulatory Visit (HOSPITAL_BASED_OUTPATIENT_CLINIC_OR_DEPARTMENT_OTHER): Payer: Medicare Other

## 2014-01-15 DIAGNOSIS — J309 Allergic rhinitis, unspecified: Secondary | ICD-10-CM

## 2014-01-15 DIAGNOSIS — D509 Iron deficiency anemia, unspecified: Secondary | ICD-10-CM

## 2014-01-15 LAB — CBC WITH DIFFERENTIAL/PLATELET
BASO%: 1.2 % (ref 0.0–2.0)
BASOS ABS: 0.1 10*3/uL (ref 0.0–0.1)
EOS%: 3.5 % (ref 0.0–7.0)
Eosinophils Absolute: 0.2 10*3/uL (ref 0.0–0.5)
HCT: 34.9 % (ref 34.8–46.6)
HEMOGLOBIN: 10.6 g/dL — AB (ref 11.6–15.9)
LYMPH%: 47.6 % (ref 14.0–49.7)
MCH: 21.2 pg — AB (ref 25.1–34.0)
MCHC: 30.4 g/dL — AB (ref 31.5–36.0)
MCV: 69.7 fL — AB (ref 79.5–101.0)
MONO#: 0.6 10*3/uL (ref 0.1–0.9)
MONO%: 10.2 % (ref 0.0–14.0)
NEUT#: 2.4 10*3/uL (ref 1.5–6.5)
NEUT%: 37.5 % — AB (ref 38.4–76.8)
Platelets: 331 10*3/uL (ref 145–400)
RBC: 5 10*6/uL (ref 3.70–5.45)
RDW: 17.1 % — ABNORMAL HIGH (ref 11.2–14.5)
WBC: 6.3 10*3/uL (ref 3.9–10.3)
lymph#: 3 10*3/uL (ref 0.9–3.3)

## 2014-01-15 LAB — IRON AND TIBC CHCC
%SAT: 17 % — ABNORMAL LOW (ref 21–57)
Iron: 50 ug/dL (ref 41–142)
TIBC: 291 ug/dL (ref 236–444)
UIBC: 241 ug/dL (ref 120–384)

## 2014-01-15 LAB — FERRITIN CHCC: Ferritin: 165 ng/ml (ref 9–269)

## 2014-01-15 NOTE — Telephone Encounter (Signed)
pt came in day early for appt....done.Marland Kitchen

## 2014-01-16 ENCOUNTER — Ambulatory Visit (INDEPENDENT_AMBULATORY_CARE_PROVIDER_SITE_OTHER): Payer: Medicare Other | Admitting: Pharmacist Clinician (PhC)/ Clinical Pharmacy Specialist

## 2014-01-16 ENCOUNTER — Other Ambulatory Visit: Payer: Self-pay

## 2014-01-16 DIAGNOSIS — I4891 Unspecified atrial fibrillation: Secondary | ICD-10-CM

## 2014-01-16 DIAGNOSIS — Z7901 Long term (current) use of anticoagulants: Secondary | ICD-10-CM

## 2014-01-16 DIAGNOSIS — I480 Paroxysmal atrial fibrillation: Secondary | ICD-10-CM

## 2014-01-16 LAB — POCT INR: INR: 1.3

## 2014-01-17 ENCOUNTER — Ambulatory Visit: Payer: Self-pay

## 2014-01-21 ENCOUNTER — Telehealth: Payer: Self-pay | Admitting: Internal Medicine

## 2014-01-21 ENCOUNTER — Ambulatory Visit (HOSPITAL_BASED_OUTPATIENT_CLINIC_OR_DEPARTMENT_OTHER): Payer: Medicare Other | Admitting: Internal Medicine

## 2014-01-21 ENCOUNTER — Encounter: Payer: Self-pay | Admitting: Internal Medicine

## 2014-01-21 VITALS — BP 138/90 | HR 104 | Temp 98.7°F | Resp 20 | Ht 62.0 in | Wt 304.6 lb

## 2014-01-21 DIAGNOSIS — D6489 Other specified anemias: Secondary | ICD-10-CM

## 2014-01-21 DIAGNOSIS — D509 Iron deficiency anemia, unspecified: Secondary | ICD-10-CM

## 2014-01-21 NOTE — Progress Notes (Signed)
[facility] [phone]   [phone]  OFFICE PROGRESS NOTE  [name], MD [address] [place] [address]  DIAGNOSIS: Unspecified anemia questionable for anemia of chronic disease/iron deficiency .   PRIOR THERAPY: None   CURRENT THERAPY: Over-the-counter oral iron tablets. 1-2 tablets daily.  INTERVAL HISTORY: Michele [name] 63 y.o. female returns to the clinic today for six-month followup visit. The patient is feeling fine today with no specific complaints except for mild fatigue but no dizzy spells. She denied having any bleeding, bruises or ecchymosis. The patient denied having any significant chest pain, shortness of breath, cough or hemoptysis. She denied having any significant weight loss or night sweats. She had repeat CBC and iron study performed recently and she is here for evaluation and discussion of her lab results.   MEDICAL HISTORY: Past Medical History  Diagnosis Date  . Allergic rhinitis   . Asthma   . CAD (coronary artery disease)   . Hypertension   . Sleep apnea   . Diabetes mellitus type II   . Depression   . PTSD (post-traumatic stress disorder)   . 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 Carpal tunnel syndrome, right   . Hyperlipidemia   . Paroxysmal a-fib   . CAD (coronary artery disease) 01/30/2007    stents trivial coronary artery disease diffusely, with a recent deployment od a intracoronary artery stent, 3.5 x 12 mm driver stent with no more than 20-30% in- stents restenosis.done by Dr [name] with re-look on novenber 10 2008 revealing a widely patent stent with otherwise trival CAD and normal LV function  . Chest pain 07/17/12009    2 D Echo EF >55%    ALLERGIES:  is allergic to meprobamate.  MEDICATIONS:  Current Outpatient Prescriptions  Medication Sig Dispense Refill  . ARIPiprazole (ABILIFY) 15 MG tablet Take 1 tablet (15 mg total) by mouth daily.  90 tablet  0  . aspirin 81 MG  tablet Take 81 mg by mouth daily.        . benztropine (COGENTIN) 0.5 MG tablet Take 1 tablet (0.5 mg total) by mouth at bedtime.  90 tablet  0  . CRESTOR 10 MG tablet TAKE ONE (1) TABLET BY MOUTH EVERY OTHER DAY  90 tablet  1  . diltiazem (CARDIZEM CD) 240 MG 24 hr capsule Take 1 capsule (240 mg total) by mouth daily.  90 capsule  2  . fexofenadine (ALLEGRA) 60 MG tablet Take 1 tablet (60 mg total) by mouth daily. Take 60 mg by mouth daily.  30 tablet  11  . Fluticasone-Salmeterol (ADVAIR DISKUS) 250-50 MCG/DOSE AEPB INHALE 1 PUFF INTO THE LUNGS EVERY 12 HOURS  3 each  1  . furosemide (LASIX) 40 MG tablet TAKE ONE (1) TABLET BY MOUTH EVERY DAY  30 tablet  4  . HYDROcodone-acetaminophen (NORCO) 5-325 MG per tablet Take 1 tablet by mouth every 6 (six) hours as needed.        . isosorbide mononitrate (IMDUR) 30 MG 24 hr tablet TAKE ONE (1) TABLET BY MOUTH EVERY DAY  90 tablet  2  . metFORMIN (GLUCOPHAGE-XR) 500 MG 24 hr tablet TAKE ONE (1) TABLET BY MOUTH EVERY DAY WITH BREAKFAST  90 tablet  0  . metoprolol (LOPRESSOR) 100 MG tablet Take 100 mg by mouth daily.      . mometasone (NASONEX) 50 MCG/ACT nasal spray Place 2 sprays into the nose daily.  17 g  2  . morphine (MSIR) 15 MG tablet Take 1 by mouth at bedtime        . pantoprazole (PROTONIX) 20 MG tablet Take 20 mg by mouth daily.      . potassium chloride (KLOR-CON) 20 MEQ packet Take 1 by mouth daily       . sertraline (ZOLOFT) 100 MG tablet Take 2 by mouth once daily  180 tablet  0  . Trospium Chloride 60 MG CP24 TAKE ONE CAPSULE BY MOUTH DAILY  90 each  1  . valsartan-hydrochlorothiazide (DIOVAN-HCT) 160-12.5 MG per tablet Take 1 tablet by mouth daily.  90 tablet  2  . VENTOLIN HFA 108 (90 BASE) MCG/ACT inhaler INHALE TWO PUFFS INTO THE LUNGS EVERY 6 HOURS AS NEEDED  18 g  4  . warfarin (COUMADIN) 7.5 MG tablet Take 1 & 1/2 to 2 tablets by mouth daily as directed  165 tablet  1  . albuterol (PROVENTIL) (2.5 MG/3ML) 0.083% nebulizer solution  Take 3 mLs (2.5 mg total) by nebulization every 6 (six) hours as needed.  360 mL  2  . nitroGLYCERIN (NITROSTAT) 0.4 MG SL tablet Place 1 tablet (0.4 mg total) under the tongue every 5 (five) minutes as needed.  25 tablet  3  . [DISCONTINUED] SANCTURA XR 60 MG CP24 Take 1 capsule by mouth daily.       No current facility-administered medications for this visit.    SURGICAL HISTORY:  Past Surgical History  Procedure Laterality Date  . Cholecystectomy    . Left groin cyst    . Kidney stones    . Stress myocardial perfusion study  09/08/2010    normal; EF 75%  . Multilevel lumbar degenerative disc disease    . Hemorrhoid surgery    . Gallbladder surgery      REVIEW OF SYSTEMS:  A comprehensive review of systems was negative except for: Constitutional: positive for fatigue   PHYSICAL EXAMINATION: General appearance: alert, cooperative, fatigued and no distress Head: Normocephalic, without obvious abnormality, atraumatic Neck: no adenopathy, no JVD, supple, symmetrical, trachea midline and thyroid not enlarged, symmetric, no tenderness/mass/nodules Lymph nodes: Cervical, supraclavicular, and axillary nodes normal. Resp: clear to auscultation bilaterally Back: symmetric, no curvature. ROM normal. No CVA tenderness. Cardio: regular rate and rhythm, S1, S2 normal, no murmur, click, rub or gallop GI: soft, non-tender; bowel sounds normal; no masses,  no organomegaly Extremities: extremities normal, atraumatic, no cyanosis or edema  ECOG PERFORMANCE STATUS: 1 - Symptomatic but completely ambulatory  Blood pressure 138/90, pulse 104, temperature 98.7 F (37.1 C), temperature source Oral, resp. rate 20, height 5\' 2"  (1.575 m), weight 304 lb 9.6 oz (138.166 kg), SpO2 100.00%.  LABORATORY DATA: Lab Results  Component Value Date   WBC 6.3 01/15/2014   HGB 10.6* 01/15/2014   HCT 34.9 01/15/2014   MCV 69.7* 01/15/2014   PLT 331 01/15/2014      Chemistry      Component Value Date/Time   NA  142 01/08/2014 1204   NA 144 10/02/2012 0905   K 4.2 01/08/2014 1204   K 3.4* 10/02/2012 0905   CL 101 01/08/2014 1204   CL 101 10/02/2012 0905   CO2 31 01/08/2014 1204   CO2 31* 10/02/2012 0905   BUN 12 01/08/2014 1204   BUN 13.9 10/02/2012 0905   CREATININE 0.71 01/08/2014 1204   CREATININE 0.8 10/02/2012 0905   CREATININE 0.76 01/07/2012 1458      Component Value Date/Time   CALCIUM  9.7 01/08/2014 1204   CALCIUM 9.9 10/02/2012 0905   ALKPHOS 86 01/08/2014 1204   AST 17 01/08/2014 1204   ALT 19 01/08/2014 1204   BILITOT 0.5 01/08/2014 1204     Other lab results: Ferritin 165, serum iron 50, total) and chemistry to 91 and iron saturation 17%.  RADIOGRAPHIC STUDIES: No results found.  ASSESSMENT AND PLAN: This is a very pleasant 63 years old [place] American female with history of persistent anemia questionable for anemia of chronic disease plus/minus iron deficiency. Her hemoglobin and hematocrit as well as the study are stable. I recommended for her to resume taking the over-the-counter oral tablet 1-2 tablets every day. She would come back for followup visit in 3 months with repeat CBC and iron study. She was advised to call immediately if she has any concerning symptoms in the interval.  The patient voices understanding of current disease status and treatment options and is in agreement with the current care plan.  All questions were answered. The patient knows to call the clinic with any problems, questions or concerns. We can certainly see the patient much sooner if necessary.  Disclaimer: This note was dictated with voice recognition software. Similar sounding words can inadvertently be transcribed and may not be corrected upon review.

## 2014-01-21 NOTE — Telephone Encounter (Signed)
Gave pt appt for lab and Md for September and October 2015

## 2014-01-24 ENCOUNTER — Ambulatory Visit (INDEPENDENT_AMBULATORY_CARE_PROVIDER_SITE_OTHER): Payer: No Typology Code available for payment source | Admitting: Psychology

## 2014-01-24 ENCOUNTER — Ambulatory Visit (INDEPENDENT_AMBULATORY_CARE_PROVIDER_SITE_OTHER): Payer: Medicare Other

## 2014-01-24 DIAGNOSIS — F331 Major depressive disorder, recurrent, moderate: Secondary | ICD-10-CM

## 2014-01-24 DIAGNOSIS — J309 Allergic rhinitis, unspecified: Secondary | ICD-10-CM

## 2014-01-24 DIAGNOSIS — F4310 Post-traumatic stress disorder, unspecified: Secondary | ICD-10-CM

## 2014-01-24 NOTE — Progress Notes (Signed)
   THERAPIST PROGRESS NOTE  Session Time: 9:00am-9:55am  Participation Level: Active  Behavioral Response: Well GroomedAlert, AFFECT wNL  Type of Therapy: Individual Therapy  Treatment Goals addressed: Diagnosis: MDD and PTSD- keeping positive .  Interventions: CBT and Strength-based  Summary: Michele [name] is a 63 y.o. female who presents with report of keeping focused on "keeping happy juices flowing".  Pt discussed her upcoming trip to 45th high school reunion and importance of this.  Pt reported that she feels that she is keeping aware of identifying happiness or at least satisfaction daily.  Pt discussed [place] but feels [place] are a barrier.  Pt increased awareness of financial assistance that may be available to her and to f/u about that.  Pt discussed her grandchildren as a source of positive and happiness in her life.  Pt discussed not allowing her medical issues be a barrier or feel less than because of them.   Suicidal/Homicidal: Nowithout intent/plan  Therapist Response: Assessed pt current functioning per pt report.  Processed w/pt transition from therapist, Michele [name], who retired and how to keep pt moving forward.  Explored w/pt actions and behaviors she is doing on a daily basis towards "keeping her happy juices flowing".   Plan: Return again in 2 weeks.  Diagnosis: Axis I: Post Traumatic Stress Disorder and MDD    Axis II: No diagnosis    Michele [name],Michele [name], [name] 01/24/2014

## 2014-01-31 ENCOUNTER — Telehealth: Payer: Self-pay | Admitting: Pharmacist Clinician (PhC)/ Clinical Pharmacy Specialist

## 2014-01-31 ENCOUNTER — Ambulatory Visit (INDEPENDENT_AMBULATORY_CARE_PROVIDER_SITE_OTHER): Payer: Medicare Other

## 2014-01-31 DIAGNOSIS — J309 Allergic rhinitis, unspecified: Secondary | ICD-10-CM

## 2014-01-31 DIAGNOSIS — R609 Edema, unspecified: Secondary | ICD-10-CM

## 2014-01-31 MED ORDER — FUROSEMIDE 40 MG PO TABS: ORAL_TABLET | ORAL | Status: DC

## 2014-01-31 NOTE — Telephone Encounter (Signed)
Need a new prescription for her Lasix 40 mg #30.Please call to [facility] today.

## 2014-01-31 NOTE — Telephone Encounter (Signed)
Rx was sent to pharmacy electronically. 

## 2014-01-31 NOTE — Telephone Encounter (Signed)
Pt doctor is Dr Gwenlyn Found.

## 2014-02-01 ENCOUNTER — Ambulatory Visit (INDEPENDENT_AMBULATORY_CARE_PROVIDER_SITE_OTHER): Payer: Medicare Other

## 2014-02-01 DIAGNOSIS — J309 Allergic rhinitis, unspecified: Secondary | ICD-10-CM

## 2014-02-06 ENCOUNTER — Ambulatory Visit: Payer: Self-pay

## 2014-02-06 ENCOUNTER — Ambulatory Visit: Payer: Self-pay | Admitting: Pharmacist Clinician (PhC)/ Clinical Pharmacy Specialist

## 2014-02-07 ENCOUNTER — Ambulatory Visit (INDEPENDENT_AMBULATORY_CARE_PROVIDER_SITE_OTHER): Payer: Medicare Other

## 2014-02-07 ENCOUNTER — Ambulatory Visit (INDEPENDENT_AMBULATORY_CARE_PROVIDER_SITE_OTHER): Payer: Medicare Other | Admitting: Pharmacist Clinician (PhC)/ Clinical Pharmacy Specialist

## 2014-02-07 ENCOUNTER — Other Ambulatory Visit: Payer: Self-pay | Admitting: Internal Medicine

## 2014-02-07 ENCOUNTER — Ambulatory Visit (INDEPENDENT_AMBULATORY_CARE_PROVIDER_SITE_OTHER): Payer: No Typology Code available for payment source | Admitting: Psychology

## 2014-02-07 DIAGNOSIS — J309 Allergic rhinitis, unspecified: Secondary | ICD-10-CM

## 2014-02-07 DIAGNOSIS — I4891 Unspecified atrial fibrillation: Secondary | ICD-10-CM

## 2014-02-07 DIAGNOSIS — F331 Major depressive disorder, recurrent, moderate: Secondary | ICD-10-CM

## 2014-02-07 DIAGNOSIS — Z7901 Long term (current) use of anticoagulants: Secondary | ICD-10-CM

## 2014-02-07 DIAGNOSIS — F4310 Post-traumatic stress disorder, unspecified: Secondary | ICD-10-CM

## 2014-02-07 DIAGNOSIS — I480 Paroxysmal atrial fibrillation: Secondary | ICD-10-CM

## 2014-02-07 LAB — POCT INR: INR: 3.5

## 2014-02-07 NOTE — Progress Notes (Signed)
   THERAPIST PROGRESS NOTE  Session Time: 9am-9.47am  Participation Level: Active  Behavioral Response: Well GroomedAlertDepressed  Type of Therapy: Individual Therapy  Treatment Goals addressed: Diagnosis: MDD, PTSD and goal 1.  Interventions: CBT and Supportive  Summary: Michele [name] is a 63 y.o. female who presents with depressed mood and affect initially.  Pt reported that she had a difficult day yesterday w/ pain and didn't get out of the house and this morning has been struggling as well.  Pt reported her mood also has been more depressed yesterday and today.  Pt was able to identify the effects of the pain on this and struggling to accept her body's limitations.  Pt was able to recognize and reframe the things she has accomplished and positives of today.  Pt laughed in session about situations/things out of her control and expressed felt good to laugh.  Pt affect brightened and was able to identify the things to keep her "positive juices" flowing.   Pt is excited about her trip to [place] to see brother, granddaughter and attend [employer] [place].    Suicidal/Homicidal: Nowithout intent/plan  Therapist Response: Assessed pt current functioning per pt report.  Processed w/ pt effect of pain on her mood and assisted pt in reframing recognizing accomplishment in her control.  Use of laughter and humor w/ pt.  Assisted pt in identifying the positives and activities/interactions to keep her good self care for positives in her life.    Plan: Return again in 2 weeks.  Diagnosis: Axis I: Major Depression, Recurrent and Post Traumatic Stress Disorder    Axis II: No diagnosis    Muaad Boehning, LPC 02/07/2014

## 2014-02-26 ENCOUNTER — Ambulatory Visit: Payer: Self-pay | Admitting: Pharmacist Clinician (PhC)/ Clinical Pharmacy Specialist

## 2014-02-26 ENCOUNTER — Encounter: Payer: Self-pay | Admitting: Internal Medicine

## 2014-02-27 ENCOUNTER — Ambulatory Visit (INDEPENDENT_AMBULATORY_CARE_PROVIDER_SITE_OTHER): Payer: Medicare Other | Admitting: Pharmacist Clinician (PhC)/ Clinical Pharmacy Specialist

## 2014-02-27 DIAGNOSIS — I4891 Unspecified atrial fibrillation: Secondary | ICD-10-CM

## 2014-02-27 DIAGNOSIS — Z7901 Long term (current) use of anticoagulants: Secondary | ICD-10-CM

## 2014-02-27 DIAGNOSIS — I480 Paroxysmal atrial fibrillation: Secondary | ICD-10-CM

## 2014-02-27 LAB — POCT INR: INR: 2.9

## 2014-02-28 ENCOUNTER — Ambulatory Visit (INDEPENDENT_AMBULATORY_CARE_PROVIDER_SITE_OTHER): Payer: No Typology Code available for payment source | Admitting: Psychology

## 2014-02-28 ENCOUNTER — Ambulatory Visit (INDEPENDENT_AMBULATORY_CARE_PROVIDER_SITE_OTHER): Payer: Medicare Other

## 2014-02-28 DIAGNOSIS — F331 Major depressive disorder, recurrent, moderate: Secondary | ICD-10-CM

## 2014-02-28 DIAGNOSIS — F4310 Post-traumatic stress disorder, unspecified: Secondary | ICD-10-CM

## 2014-02-28 DIAGNOSIS — J309 Allergic rhinitis, unspecified: Secondary | ICD-10-CM

## 2014-02-28 NOTE — Progress Notes (Signed)
   THERAPIST PROGRESS NOTE  Session Time: 10am-10.50am  Participation Level: Active  Behavioral Response: Well GroomedAlert, AFFECT bright  Type of Therapy: Individual Therapy  Treatment Goals addressed: Diagnosis: PTSD, MDD and goal 1.  Interventions: CBT and Supportive  Summary: Michele [name] is a 63 y.o. female who presents with full and bright affect.  Pt reports that she has been happy and not having down feelings lately.  Pt reported on trip to [place] for high school reunion and visiting w/ family.  Pt reported this was helpful in getting her "healthy juices" flowing.  Pt acknowledges that she can be happy in [place] or in [place].  Pt was able to discuss the supports that she has contact with and activities that she is engaging in that will help keep well emotionally.   Pt discussed starting yoga class w/ her sister and package of massages she can redeem.  Pt was able to increase awareness that she has positive relationship built on past shared experiences that were positive w/ friends in high school and need to create positive shared experience w/ groups here through social interaction.     Suicidal/Homicidal: Nowithout intent/plan  Therapist Response: Assessed pt current functioning per pt report.  Processed w/pt positive impact of her trip to [place] and positive interactions experienced.  Explored w/ pt ways that she has positive experiences here and ways to grow positive shared experiences w/ groups/social network local.   Plan: Return again in 2 weeks.  Diagnosis: Axis I: Major Depression, Recurrent  and Post Traumatic Stress Disorder    Axis II: No diagnosis    [name],[name], LPC 02/28/2014

## 2014-03-07 ENCOUNTER — Ambulatory Visit: Payer: Self-pay | Admitting: Internal Medicine

## 2014-03-07 ENCOUNTER — Ambulatory Visit: Payer: Self-pay

## 2014-03-12 ENCOUNTER — Telehealth: Payer: Self-pay | Admitting: Emergency Medicine

## 2014-03-12 NOTE — Telephone Encounter (Signed)
Patient states that she was prescribed Predisone 11m from another doctor and wants to know if it's OK to take with her other medications  [phone]

## 2014-03-12 NOTE — Telephone Encounter (Signed)
FYI Pt was prescribed prednisone for inflammation in her knee- Dr. Nelva Bush prescribed this. She provided a list of her medications before this was prescribed. Advised pt to call coumadin clinic to report change in prescriptions. Pt would like Dr. [name] to know. She is going to call the coumadin clinic right away.

## 2014-03-13 ENCOUNTER — Telehealth: Payer: Self-pay | Admitting: Pharmacist Clinician (PhC)/ Clinical Pharmacy Specialist

## 2014-03-13 NOTE — Telephone Encounter (Signed)
Pt called x 2, LMOM,  states another MD gave her prednisone 10mg  6 day taper, wants to know if ok to take with warfarin.    Returned call [facility] - advised prednisone can sometimes interfere with INR, her last reading on 8/13 was 2.9.  Would suggest she come in Friday for INR check, asked her to call and let me know what time would work for her.

## 2014-03-14 ENCOUNTER — Ambulatory Visit (INDEPENDENT_AMBULATORY_CARE_PROVIDER_SITE_OTHER): Payer: No Typology Code available for payment source | Admitting: Psychology

## 2014-03-14 ENCOUNTER — Ambulatory Visit (INDEPENDENT_AMBULATORY_CARE_PROVIDER_SITE_OTHER): Payer: Medicare Other

## 2014-03-14 DIAGNOSIS — F331 Major depressive disorder, recurrent, moderate: Secondary | ICD-10-CM

## 2014-03-14 DIAGNOSIS — F4310 Post-traumatic stress disorder, unspecified: Secondary | ICD-10-CM

## 2014-03-14 DIAGNOSIS — J309 Allergic rhinitis, unspecified: Secondary | ICD-10-CM

## 2014-03-14 NOTE — Progress Notes (Signed)
   THERAPIST PROGRESS NOTE  Session Time: 8.50am-9.45am  Participation Level: Active  Behavioral Response: Well GroomedAlertDepressed  Type of Therapy: Individual Therapy  Treatment Goals addressed: Diagnosis: MDD, PTSD and goal 1.  Interventions: CBT and Strength-based  Summary: Michele [name] is a 63 y.o. female who presents with affect congruent w/ report of depressed mood over past week.  Pt increased awareness of pattern of depressed period after returning home from visiting her "home town".  Pt was able to reframe and identify the positive that are here for her, supports and loved ones in [place].  Pt also discussed what she is grateful for and focusing on things that will elicit "happy juices'.  Pt discussed that her younger sister is coming to [place] as she is separating from her husband and this was unexpecting to her.  Pt also discussed awareness of pain and other medical issues and how they contribute to her mood. Pt reported on focusing on awareness of tension and using relaxation, using massage and planning for senior yoga.   Suicidal/Homicidal: Nowithout intent/plan  Therapist Response: Assessed pt current functioning per pt report.  PRocessed w/ pt report of increased depression and reflected to pt pattern of increased depression w/ in weeks of visits to home.  Assisted pt in awareness of healthy things she is connecting to in [place] and how to utilize for "happy juices".    Plan: Return again in 2 weeks.  Diagnosis: Axis I: Post Traumatic Stress Disorder and MDD    Axis II: No diagnosis    [name],[name], LPC 03/14/2014

## 2014-03-15 ENCOUNTER — Ambulatory Visit (INDEPENDENT_AMBULATORY_CARE_PROVIDER_SITE_OTHER): Payer: Medicare Other | Admitting: Pharmacist Clinician (PhC)/ Clinical Pharmacy Specialist

## 2014-03-15 DIAGNOSIS — I480 Paroxysmal atrial fibrillation: Secondary | ICD-10-CM

## 2014-03-15 DIAGNOSIS — Z7901 Long term (current) use of anticoagulants: Secondary | ICD-10-CM

## 2014-03-15 DIAGNOSIS — I4891 Unspecified atrial fibrillation: Secondary | ICD-10-CM

## 2014-03-15 LAB — POCT INR: INR: 2

## 2014-03-21 ENCOUNTER — Ambulatory Visit (INDEPENDENT_AMBULATORY_CARE_PROVIDER_SITE_OTHER): Payer: Medicare Other

## 2014-03-21 DIAGNOSIS — J309 Allergic rhinitis, unspecified: Secondary | ICD-10-CM

## 2014-03-27 ENCOUNTER — Ambulatory Visit: Payer: Self-pay | Admitting: Pharmacist Clinician (PhC)/ Clinical Pharmacy Specialist

## 2014-03-28 ENCOUNTER — Ambulatory Visit (INDEPENDENT_AMBULATORY_CARE_PROVIDER_SITE_OTHER): Payer: Medicare Other

## 2014-03-28 ENCOUNTER — Ambulatory Visit (INDEPENDENT_AMBULATORY_CARE_PROVIDER_SITE_OTHER): Payer: No Typology Code available for payment source | Admitting: Psychology

## 2014-03-28 DIAGNOSIS — F4310 Post-traumatic stress disorder, unspecified: Secondary | ICD-10-CM

## 2014-03-28 DIAGNOSIS — F331 Major depressive disorder, recurrent, moderate: Secondary | ICD-10-CM

## 2014-03-28 DIAGNOSIS — J309 Allergic rhinitis, unspecified: Secondary | ICD-10-CM

## 2014-03-28 NOTE — Progress Notes (Signed)
   THERAPIST PROGRESS NOTE  Session Time: 11am-11.45am  Participation Level: Active  Behavioral Response: Well GroomedAlert, AFFECT consistent w/ report of pain  Type of Therapy: Individual Therapy  Treatment Goals addressed: Coping and Diagnosis: MDD, PTSD.    Interventions: CBT  Summary: Michele [name] is a 63 y.o. female who presents with report of bad back pain this morning that made it difficult to get up and moving.  Pt expressed she is feeling very thankful to get to her appointment. Pt reported that she has been experiencing some severe back pain recently and that did effect missing another appointment.  Pt discussed how she "beat herself up" about that and was able to reframe using self compassion towards self. Pt discussed need to continue to practice that for self.  Pt reported that her sister from [place] is temporarily staying in [place] and she has enjoyed having her companionship and encouraging each other.   Suicidal/Homicidal: Nowithout intent/plan  Therapist Response: Assessed pt current functioning per her report.  Processed w/ pt effect of pain on functioning and how she is coping w/ her skills and strengths.  Reflected to pt negative self talk re: missed appointment and encouraged pt use of self compassion to reframe.  Explored w/ pt interactions w/ having another sister in town.   Plan: Return again in 2 weeks.  Diagnosis:  Post Traumatic Stress Disorder and MDD        [name],[name], LPC 03/28/2014

## 2014-04-04 ENCOUNTER — Ambulatory Visit (INDEPENDENT_AMBULATORY_CARE_PROVIDER_SITE_OTHER): Payer: Medicare Other

## 2014-04-04 DIAGNOSIS — J309 Allergic rhinitis, unspecified: Secondary | ICD-10-CM

## 2014-04-08 ENCOUNTER — Ambulatory Visit (HOSPITAL_COMMUNITY): Payer: Self-pay | Admitting: Psychiatry

## 2014-04-10 ENCOUNTER — Other Ambulatory Visit: Payer: Self-pay | Admitting: Emergency Medicine

## 2014-04-10 ENCOUNTER — Ambulatory Visit (INDEPENDENT_AMBULATORY_CARE_PROVIDER_SITE_OTHER): Payer: Medicare Other | Admitting: Pharmacist Clinician (PhC)/ Clinical Pharmacy Specialist

## 2014-04-10 ENCOUNTER — Telehealth: Payer: Self-pay | Admitting: Pharmacist Clinician (PhC)/ Clinical Pharmacy Specialist

## 2014-04-10 DIAGNOSIS — Z7901 Long term (current) use of anticoagulants: Secondary | ICD-10-CM

## 2014-04-10 DIAGNOSIS — I4891 Unspecified atrial fibrillation: Secondary | ICD-10-CM

## 2014-04-10 DIAGNOSIS — I480 Paroxysmal atrial fibrillation: Secondary | ICD-10-CM

## 2014-04-10 LAB — POCT INR
INR: 1.8
INR: 3

## 2014-04-10 NOTE — Telephone Encounter (Signed)
Spoke with [employer]. OK to change warfarin manufacturer.

## 2014-04-10 NOTE — Telephone Encounter (Signed)
Molly(pharmacist) called in reference to changing the manufactory brand of warafin. She needed to know that is was ok to do so. Please call  Thanks

## 2014-04-11 ENCOUNTER — Ambulatory Visit (INDEPENDENT_AMBULATORY_CARE_PROVIDER_SITE_OTHER): Payer: Medicare Other

## 2014-04-11 ENCOUNTER — Ambulatory Visit (INDEPENDENT_AMBULATORY_CARE_PROVIDER_SITE_OTHER): Payer: No Typology Code available for payment source | Admitting: Psychology

## 2014-04-11 DIAGNOSIS — F331 Major depressive disorder, recurrent, moderate: Secondary | ICD-10-CM

## 2014-04-11 DIAGNOSIS — F4310 Post-traumatic stress disorder, unspecified: Secondary | ICD-10-CM

## 2014-04-11 DIAGNOSIS — J309 Allergic rhinitis, unspecified: Secondary | ICD-10-CM

## 2014-04-11 NOTE — Progress Notes (Signed)
   THERAPIST PROGRESS NOTE  Session Time: 11am-11.45am  Participation Level: Active  Behavioral Response: Well GroomedAlertDepressed  Type of Therapy: Individual Therapy  Treatment Goals addressed: Diagnosis: PTSD, MDD and goal 1.  Interventions: CBT and Supportive  Summary: Michele [name] is a 63 y.o. female who presents with report of increased depressed mood over past couple of weeks.  Pt reported that she is struggling w/ physical problems and pain creating barriers for her maintaining her home.  Pt discussed also financial barriers that make difficult managing all responsibilities.  Pt struggles w/ asking for help as doesn't want to be burden to daughter.  Pt able to recognize distortion as daughter doesn't feel she is burden and accepting that she needs help from others and not a reflection of who she is as person.  Pt also reported stressor this week of sister who had a minor stroke and is at the [place] in [place] and she and another sister are traveling back and forth.  Pt discussed feeling more worn out because of this as well.  Pt acknowledged to keep aware of self care for her physical wellness for overall wellness.   Suicidal/Homicidal: Nowithout intent/plan  Therapist Response: Assessed pt current functioning per pt report.  Processed w/pt contributing factors to increased depressed mood and assisted pt in reframing distortions and acceptance of needing help doesn't equal being burden.  Explored w/pt and reflected steps pt taking for her wellness and keeping aware of this.    Plan: Return again in 2 weeks.  Diagnosis: Axis I: Post Traumatic Stress Disorder and MDD    Axis II: No diagnosis    Andruw Battie, [place] 04/11/2014

## 2014-04-17 ENCOUNTER — Other Ambulatory Visit (HOSPITAL_BASED_OUTPATIENT_CLINIC_OR_DEPARTMENT_OTHER): Payer: Medicare Other

## 2014-04-17 DIAGNOSIS — D509 Iron deficiency anemia, unspecified: Secondary | ICD-10-CM

## 2014-04-17 DIAGNOSIS — D6489 Other specified anemias: Secondary | ICD-10-CM

## 2014-04-17 LAB — CBC WITH DIFFERENTIAL/PLATELET
BASO%: 0.4 % (ref 0.0–2.0)
BASOS ABS: 0 10*3/uL (ref 0.0–0.1)
EOS%: 1.9 % (ref 0.0–7.0)
Eosinophils Absolute: 0.1 10*3/uL (ref 0.0–0.5)
HEMATOCRIT: 37.5 % (ref 34.8–46.6)
HEMOGLOBIN: 11.6 g/dL (ref 11.6–15.9)
LYMPH%: 39 % (ref 14.0–49.7)
MCH: 20.9 pg — AB (ref 25.1–34.0)
MCHC: 30.9 g/dL — ABNORMAL LOW (ref 31.5–36.0)
MCV: 67.8 fL — ABNORMAL LOW (ref 79.5–101.0)
MONO#: 0.7 10*3/uL (ref 0.1–0.9)
MONO%: 9.4 % (ref 0.0–14.0)
NEUT#: 3.8 10*3/uL (ref 1.5–6.5)
NEUT%: 49.3 % (ref 38.4–76.8)
Platelets: 358 10*3/uL (ref 145–400)
RBC: 5.53 10*6/uL — ABNORMAL HIGH (ref 3.70–5.45)
RDW: 18.2 % — ABNORMAL HIGH (ref 11.2–14.5)
WBC: 7.7 10*3/uL (ref 3.9–10.3)
lymph#: 3 10*3/uL (ref 0.9–3.3)

## 2014-04-17 LAB — IRON AND TIBC CHCC
%SAT: 17 % — ABNORMAL LOW (ref 21–57)
Iron: 52 ug/dL (ref 41–142)
TIBC: 309 ug/dL (ref 236–444)
UIBC: 257 ug/dL (ref 120–384)

## 2014-04-17 LAB — FERRITIN CHCC: Ferritin: 217 ng/ml (ref 9–269)

## 2014-04-18 ENCOUNTER — Ambulatory Visit: Payer: Medicare Other

## 2014-04-23 ENCOUNTER — Encounter: Payer: Self-pay | Admitting: Internal Medicine

## 2014-04-23 ENCOUNTER — Telehealth: Payer: Self-pay | Admitting: Internal Medicine

## 2014-04-23 ENCOUNTER — Ambulatory Visit (HOSPITAL_BASED_OUTPATIENT_CLINIC_OR_DEPARTMENT_OTHER): Payer: Medicare Other | Admitting: Internal Medicine

## 2014-04-23 VITALS — BP 97/59 | HR 79 | Temp 98.3°F | Resp 18 | Ht 62.0 in | Wt 301.8 lb

## 2014-04-23 DIAGNOSIS — D509 Iron deficiency anemia, unspecified: Secondary | ICD-10-CM

## 2014-04-23 DIAGNOSIS — D649 Anemia, unspecified: Secondary | ICD-10-CM

## 2014-04-23 NOTE — Progress Notes (Signed)
[facility] [phone]   [phone]  OFFICE PROGRESS NOTE  [name], MD [address] [place] [address]  DIAGNOSIS: Unspecified anemia questionable for anemia of chronic disease/iron deficiency .   PRIOR THERAPY: None   CURRENT THERAPY: Over-the-counter oral iron tablets. 1-2 tablets daily.  INTERVAL HISTORY: Michele [name] 63 y.o. female returns to the clinic today for six-month followup visit. The patient is feeling fine today with no specific complaints except for mild fatigue but no dizzy spells. She takes several blood pressure medication and her systolic blood pressure was in the 90s today and probably contributing to her fatigue. She is tolerating the oral tablets well. She denied having any bleeding, bruises or ecchymosis. The patient denied having any significant chest pain, shortness of breath, cough or hemoptysis. She denied having any significant weight loss or night sweats. She had repeat CBC and iron study performed recently and she is here for evaluation and discussion of her lab results.   MEDICAL HISTORY: Past Medical History  Diagnosis Date  . Allergic rhinitis   . Asthma   . CAD (coronary artery disease)   . Hypertension   . Sleep apnea   . Diabetes mellitus type II   . Depression   . PTSD (post-traumatic stress disorder)   . 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 Carpal tunnel syndrome, right   . Hyperlipidemia   . Paroxysmal a-fib   . CAD (coronary artery disease) 01/30/2007    stents trivial coronary artery disease diffusely, with a recent deployment od a intracoronary artery stent, 3.5 x 12 mm driver stent with no more than 20-30% in- stents restenosis.done by Dr [name] with re-look on novenber 10 2008 revealing a widely patent stent with otherwise trival CAD and normal LV function  . Chest pain 07/17/12009    2 D Echo EF >55%    ALLERGIES:  is allergic to meprobamate.  MEDICATIONS:    Current Outpatient Prescriptions  Medication Sig Dispense Refill  . albuterol (PROVENTIL) (2.5 MG/3ML) 0.083% nebulizer solution Take 3 mLs (2.5 mg total) by nebulization every 6 (six) hours as needed.  360 mL  2  . ARIPiprazole (ABILIFY) 15 MG tablet Take 1 tablet (15 mg total) by mouth daily.  90 tablet  0  . aspirin 81 MG tablet Take 81 mg by mouth daily.        . benztropine (COGENTIN) 0.5 MG tablet Take 1 tablet (0.5 mg total) by mouth at bedtime.  90 tablet  0  . CRESTOR 10 MG tablet TAKE ONE (1) TABLET BY MOUTH EVERY OTHER DAY  90 tablet  1  . diltiazem (CARDIZEM CD) 240 MG 24 hr capsule Take 1 capsule (240 mg total) by mouth daily.  90 capsule  2  . fexofenadine (ALLEGRA) 60 MG tablet Take 1 tablet (60 mg total) by mouth daily. Take 60 mg by mouth daily.  30 tablet  11  . Fluticasone-Salmeterol (ADVAIR DISKUS) 250-50 MCG/DOSE AEPB INHALE 1 PUFF INTO THE LUNGS EVERY 12 HOURS  3 each  1  . furosemide (LASIX) 40 MG tablet TAKE ONE (1) TABLET BY MOUTH EVERY DAY  30 tablet  4  . HYDROcodone-acetaminophen (NORCO) 5-325 MG per tablet Take 1 tablet by mouth every 6 (six) hours as needed.        . isosorbide mononitrate (IMDUR) 30 MG 24 hr tablet TAKE ONE (1) TABLET BY MOUTH EVERY DAY  90 tablet  2  . metFORMIN (GLUCOPHAGE-XR) 500 MG 24 hr tablet TAKE ONE (1) TABLET BY MOUTH EVERY DAY WITH BREAKFAST  90 tablet  0  . metoprolol (LOPRESSOR) 100 MG tablet Take 100 mg by mouth daily.      . mometasone (NASONEX) 50 MCG/ACT nasal spray Place 2 sprays into the nose daily.  17 g  2  . morphine (MSIR) 15 MG tablet Take 1 by mouth at bedtime        . nitroGLYCERIN (NITROSTAT) 0.4 MG SL tablet Place 1 tablet (0.4 mg total) under the tongue every 5 (five) minutes as needed.  25 tablet  3  . pantoprazole (PROTONIX) 20 MG tablet Take 20 mg by mouth daily.      . potassium chloride (KLOR-CON) 20 MEQ packet Take 1 by mouth daily       . sertraline (ZOLOFT) 100 MG tablet Take 2 by mouth once daily  180 tablet   0  . Trospium Chloride 60 MG CP24 TAKE ONE CAPSULE BY MOUTH DAILY  90 each  1  . valsartan-hydrochlorothiazide (DIOVAN-HCT) 160-12.5 MG per tablet Take 1 tablet by mouth daily.  90 tablet  2  . VENTOLIN HFA 108 (90 BASE) MCG/ACT inhaler INHALE 2 PUFFS INTO THE LUNGS EVERY 6 HOURS AS NEEDED  18 g  0  . warfarin (COUMADIN) 7.5 MG tablet Take 1 & 1/2 to 2 tablets by mouth daily as directed  165 tablet  1  . [DISCONTINUED] SANCTURA XR 60 MG CP24 Take 1 capsule by mouth daily.       No current facility-administered medications for this visit.    SURGICAL HISTORY:  Past Surgical History  Procedure Laterality Date  . Cholecystectomy    . Left groin cyst    . Kidney stones    . Stress myocardial perfusion study  09/08/2010    normal; EF 75%  . Multilevel lumbar degenerative disc disease    . Hemorrhoid surgery    . Gallbladder surgery      REVIEW OF SYSTEMS:  A comprehensive review of systems was negative except for: Constitutional: positive for fatigue   PHYSICAL EXAMINATION: General appearance: alert, cooperative, fatigued and no distress Head: Normocephalic, without obvious abnormality, atraumatic Neck: no adenopathy, no JVD, supple, symmetrical, trachea midline and thyroid not enlarged, symmetric, no tenderness/mass/nodules Lymph nodes: Cervical, supraclavicular, and axillary nodes normal. Resp: clear to auscultation bilaterally Back: symmetric, no curvature. ROM normal. No CVA tenderness. Cardio: regular rate and rhythm, S1, S2 normal, no murmur, click, rub or gallop GI: soft, non-tender; bowel sounds normal; no masses,  no organomegaly Extremities: extremities normal, atraumatic, no cyanosis or edema  ECOG PERFORMANCE STATUS: 1 - Symptomatic but completely ambulatory  Blood pressure 97/59, pulse 79, temperature 98.3 F (36.8 C), temperature source Oral, resp. rate 18, height 5\' 2"  (1.575 m), weight 301 lb 12.8 oz (136.896 kg).  LABORATORY DATA: Lab Results  Component Value Date    WBC 7.7 04/17/2014   HGB 11.6 04/17/2014   HCT 37.5 04/17/2014   MCV 67.8* 04/17/2014   PLT 358 04/17/2014      Chemistry      Component Value Date/Time   NA 142 01/08/2014 1204   NA 144 10/02/2012 0905   K 4.2 01/08/2014 1204   K 3.4* 10/02/2012 0905   CL 101 01/08/2014 1204   CL 101 10/02/2012 0905   CO2 31 01/08/2014 1204   CO2 31* 10/02/2012 0905   BUN 12 01/08/2014 1204   BUN 13.9 10/02/2012 0905  CREATININE 0.71 01/08/2014 1204   CREATININE 0.8 10/02/2012 0905   CREATININE 0.76 01/07/2012 1458      Component Value Date/Time   CALCIUM 9.7 01/08/2014 1204   CALCIUM 9.9 10/02/2012 0905   ALKPHOS 86 01/08/2014 1204   AST 17 01/08/2014 1204   ALT 19 01/08/2014 1204   BILITOT 0.5 01/08/2014 1204     Other lab results: Ferritin 217, serum iron 52, TIBC 309 and iron saturation 17%.  RADIOGRAPHIC STUDIES: No results found.  ASSESSMENT AND PLAN: This is a very pleasant 63 years old [place] American female with history of persistent anemia questionable for anemia of chronic disease plus/minus iron deficiency. Her hemoglobin and hematocrit as well as the study are better today. I recommended for her to resume taking the over-the-counter oral tablet 1-2 tablets every day. She would come back for followup visit in 6 months with repeat CBC and iron study. She was advised to call immediately if she has any concerning symptoms in the interval.  The patient voices understanding of current disease status and treatment options and is in agreement with the current care plan.  All questions were answered. The patient knows to call the clinic with any problems, questions or concerns. We can certainly see the patient much sooner if necessary.  Disclaimer: This note was dictated with voice recognition software. Similar sounding words can inadvertently be transcribed and may not be corrected upon review.

## 2014-04-23 NOTE — Telephone Encounter (Signed)
gv adn printed appt sched and avs for pt for April 2016

## 2014-04-24 ENCOUNTER — Ambulatory Visit (INDEPENDENT_AMBULATORY_CARE_PROVIDER_SITE_OTHER): Payer: No Typology Code available for payment source | Admitting: Psychiatry

## 2014-04-24 ENCOUNTER — Encounter (HOSPITAL_COMMUNITY): Payer: Self-pay | Admitting: Psychiatry

## 2014-04-24 VITALS — BP 117/75 | HR 75 | Ht 62.0 in | Wt 302.8 lb

## 2014-04-24 DIAGNOSIS — F331 Major depressive disorder, recurrent, moderate: Secondary | ICD-10-CM

## 2014-04-24 DIAGNOSIS — F333 Major depressive disorder, recurrent, severe with psychotic symptoms: Secondary | ICD-10-CM

## 2014-04-24 DIAGNOSIS — F4310 Post-traumatic stress disorder, unspecified: Secondary | ICD-10-CM

## 2014-04-24 MED ORDER — ARIPIPRAZOLE 15 MG PO TABS: 15.0000 mg | ORAL_TABLET | Freq: Every day | ORAL | Status: DC

## 2014-04-24 MED ORDER — SERTRALINE HCL 100 MG PO TABS: ORAL_TABLET | ORAL | Status: DC

## 2014-04-24 MED ORDER — BENZTROPINE MESYLATE 0.5 MG PO TABS: 0.5000 mg | ORAL_TABLET | Freq: Every day | ORAL | Status: DC
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
## 2014-04-25 ENCOUNTER — Ambulatory Visit (INDEPENDENT_AMBULATORY_CARE_PROVIDER_SITE_OTHER): Payer: No Typology Code available for payment source | Admitting: Psychology

## 2014-04-25 ENCOUNTER — Ambulatory Visit (INDEPENDENT_AMBULATORY_CARE_PROVIDER_SITE_OTHER): Payer: Medicare Other

## 2014-04-25 DIAGNOSIS — F4310 Post-traumatic stress disorder, unspecified: Secondary | ICD-10-CM

## 2014-04-25 DIAGNOSIS — J309 Allergic rhinitis, unspecified: Secondary | ICD-10-CM

## 2014-04-25 NOTE — Progress Notes (Signed)
   THERAPIST PROGRESS NOTE  Session Time: 11am-11:48am  Participation Level: Active  Behavioral Response: Well GroomedAlertAFFECT WNL  Type of Therapy: Individual Therapy  Treatment Goals addressed: Diagnosis: MDD, PTSD, managing mood, reducing symptoms w/ use of self care and coping skills.   Interventions: CBT and Supportive  Summary: Michele [name] is a 63 y.o. female who presents with report of improved mood today.  Pt reported that yesterday was a very hard day for her- she felt down, pain w/ her knee, was dealing w/ IBS flare up, and had stressor of son expecting her to lend money she cannot.  Pt reported she cared for self best she could and did wake up today in better mood, less pain etc. Pt discussed healthy boundaries she is setting w/ her son and feels good about this but doesn't appreciate the expectation from son for different.  Pt discussed also focusing on doing what she can that is in her control, letting go of what she can't- accepting her limitations w/out self shaming.  Pt discussed supports system with her sisters and supports she is to them as well.   Suicidal/Homicidal: Nowithout intent/plan  Therapist Response: Assessed pt current functioning per pt report. Processed w/pt her improvement today- contributing factors to mood yesterday and reflected things she did to care for herself.  Reiterated pt healthy boundaries she is establishing w/ her son.  Discussed self compassion self talk when accepting what can't change and focusing on what can.   Plan: Return again in 2 weeks.  Diagnosis: Axis I: Post Traumatic Stress Disorder and MDD, recurrent moderate    Axis II: No diagnosis    [name],[name], LPC 04/25/2014

## 2014-05-02 ENCOUNTER — Ambulatory Visit (INDEPENDENT_AMBULATORY_CARE_PROVIDER_SITE_OTHER): Payer: Medicare Other

## 2014-05-02 DIAGNOSIS — Z23 Encounter for immunization: Secondary | ICD-10-CM

## 2014-05-02 DIAGNOSIS — J309 Allergic rhinitis, unspecified: Secondary | ICD-10-CM

## 2014-05-07 ENCOUNTER — Encounter: Payer: Self-pay | Admitting: Emergency Medicine

## 2014-05-07 ENCOUNTER — Ambulatory Visit (INDEPENDENT_AMBULATORY_CARE_PROVIDER_SITE_OTHER): Payer: Medicare Other | Admitting: Emergency Medicine

## 2014-05-07 VITALS — BP 140/84 | HR 80 | Temp 98.0°F | Resp 16 | Ht 62.0 in | Wt 304.6 lb

## 2014-05-07 DIAGNOSIS — I10 Essential (primary) hypertension: Secondary | ICD-10-CM

## 2014-05-07 DIAGNOSIS — E119 Type 2 diabetes mellitus without complications: Secondary | ICD-10-CM

## 2014-05-07 LAB — GLUCOSE, POCT (MANUAL RESULT ENTRY): POC GLUCOSE: 114 mg/dL — AB (ref 70–99)

## 2014-05-07 LAB — POCT GLYCOSYLATED HEMOGLOBIN (HGB A1C): HEMOGLOBIN A1C: 6.5

## 2014-05-07 NOTE — Progress Notes (Signed)
Subjective:  This chart was scribed for Michele Queen, MD by Donato Schultz, Medical Scribe. This patient was seen in Room 21 and the patient's care was started at 11:28 AM.   Patient ID: Michele [name], female    DOB: 1950/12/06, 63 y.o.   MRN: [id number]  HPI HPI Comments: Michele STONEHOCKER is a 63 y.o. female who presents to the [facility] for a follow-up exam regarding her hypertension.  She is complaining of swelling in her left ankle.  She has been taking Lasix with no relief to her symptoms.  She has not been elevating her feet or wearing compression stockings.  She is still complaining of bladder incontinence and recurrent yeast infections.  She has used Monostat with no relief to her symptoms.  Her last PAP smear was a couple of months ago.  Her last TSH was 1.842 in June.  She takes Metoprolol BID.  She sees Dr. Nelva Bush at pain management.  Her psychiatrist is Dr. Sharl Ma who manages her Abilify and Zoloft prescriptions.  She is also being [facility]  She got a flu shot last Thursday.  She has not made any major changes in her diet but states that she is more cautious of how much carbohydrates she eats.  She does not exercise regularly.  She has not returned to [employer] because it is outside of her budget.    Patient Active Problem List   Diagnosis Date Noted  . Hyperlipidemia 07/17/2013  . Chest pain on exertion 12/14/2012  . Long term (current) use of anticoagulants 10/03/2012  . Major depressive disorder, recurrent episode, moderate 09/02/2011  . Menopausal and perimenopausal disorder 05/27/2011  . Depression 05/27/2011  . Urethrocele 05/27/2011  . Anemia, iron deficiency 05/27/2011  . Paroxysmal a-fib 05/27/2011  . Vitamin d deficiency 05/27/2011  . PTSD (post-traumatic stress disorder) 05/27/2011  . Abnormal brain MRI 05/27/2011  . PAD (peripheral artery disease) 05/27/2011  . PFO (patent foramen ovale) 05/27/2011    . Diabetes mellitus type II   . DM 12/16/2009  . 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 SLEEP APNEA, OBSTRUCTIVE 05/10/2007  . HYPERTENSION 05/10/2007  . CORONARY ARTERY DISEASE 05/10/2007  . Seasonal and perennial allergic rhinitis 05/10/2007  . 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 REFLUX, ESOPHAGEAL 05/10/2007   Past Medical History  Diagnosis Date  . Allergic rhinitis   . Asthma   . CAD (coronary artery disease)   . Hypertension   . Sleep apnea   . Diabetes mellitus type II   . Depression   . PTSD (post-traumatic stress disorder)   . 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 Carpal tunnel syndrome, right   . Hyperlipidemia   . Paroxysmal a-fib   . CAD (coronary artery disease) 01/30/2007    stents trivial coronary artery disease diffusely, with a recent deployment od a intracoronary artery stent, 3.5 x 12 mm driver stent with no more than 20-30% in- stents restenosis.done by Dr [name] with re-look on novenber 10 2008 revealing a widely patent stent with otherwise trival CAD and normal LV function  . Chest pain 07/17/12009    2 D Echo EF >55%   Past Surgical History  Procedure Laterality Date  . Cholecystectomy    . Left groin cyst    . Kidney stones    . Stress myocardial perfusion study  09/08/2010    normal; EF 75%  . Multilevel lumbar degenerative  disc disease    . Hemorrhoid surgery    . Gallbladder surgery     Allergies  Allergen Reactions  . Meprobamate Nausea And Vomiting   Prior to Admission medications   Medication Sig Start Date End Date Taking? Authorizing Provider  ARIPiprazole (ABILIFY) 15 MG tablet Take 1 tablet (15 mg total) by mouth daily. 04/24/14  Yes Kathlee Nations, MD  aspirin 81 MG tablet Take 81 mg by mouth daily.     Yes Historical Provider, MD  benztropine (COGENTIN) 0.5 MG tablet Take 1 tablet (0.5 mg total) by mouth at bedtime. 04/24/14  Yes Kathlee Nations, MD  CRESTOR 10 MG tablet TAKE ONE (1) TABLET BY MOUTH EVERY OTHER DAY  08/02/13  Yes [name], MD  diltiazem (CARDIZEM CD) 240 MG 24 hr capsule Take 1 capsule (240 mg total) by mouth daily. 11/06/13  Yes Lorretta Harp, MD  fexofenadine (ALLEGRA) 60 MG tablet Take 1 tablet (60 mg total) by mouth daily. Take 60 mg by mouth daily. 12/16/10  Yes Deneise Lever, MD  Fluticasone-Salmeterol (ADVAIR DISKUS) 250-50 MCG/DOSE AEPB INHALE 1 PUFF INTO THE LUNGS EVERY 12 HOURS 11/06/13  Yes Deneise Lever, MD  furosemide (LASIX) 40 MG tablet TAKE ONE (1) TABLET BY MOUTH EVERY DAY 01/31/14  Yes Lorretta Harp, MD  HYDROcodone-acetaminophen (NORCO) 5-325 MG per tablet Take 1 tablet by mouth every 6 (six) hours as needed.     Yes Historical Provider, MD  isosorbide mononitrate (IMDUR) 30 MG 24 hr tablet TAKE ONE (1) TABLET BY MOUTH EVERY DAY 09/18/13  Yes Lorretta Harp, MD  metFORMIN (GLUCOPHAGE-XR) 500 MG 24 hr tablet TAKE ONE (1) TABLET BY MOUTH EVERY DAY WITH BREAKFAST 04/11/14  Yes Mancel Bale, PA-C  metoprolol (LOPRESSOR) 100 MG tablet Take 100 mg by mouth daily. 08/02/13  Yes Historical Provider, MD  mometasone (NASONEX) 50 MCG/ACT nasal spray Place 2 sprays into the nose daily. 10/23/12  Yes Deneise Lever, MD  morphine (MSIR) 15 MG tablet Take 1 by mouth at bedtime     Yes Historical Provider, MD  nitroGLYCERIN (NITROSTAT) 0.4 MG SL tablet Place 1 tablet (0.4 mg total) under the tongue every 5 (five) minutes as needed. 12/14/12  Yes Brett Canales, PA-C  pantoprazole (PROTONIX) 20 MG tablet Take 20 mg by mouth daily.   Yes Historical Provider, MD  potassium chloride (KLOR-CON) 20 MEQ packet Take 1 by mouth daily    Yes Historical Provider, MD  sertraline (ZOLOFT) 100 MG tablet Take 2 by mouth once daily 04/24/14  Yes Kathlee Nations, MD  Trospium Chloride 60 MG CP24 TAKE ONE CAPSULE BY MOUTH DAILY 11/06/13  Yes Heather M Marte, PA-C  valsartan-hydrochlorothiazide (DIOVAN-HCT) 160-12.5 MG per tablet Take 1 tablet by mouth daily. 11/06/13  Yes Lorretta Harp, MD  VENTOLIN HFA  108 (90 BASE) MCG/ACT inhaler INHALE 2 PUFFS INTO THE LUNGS EVERY 6 HOURS AS NEEDED 02/07/14  Yes Deneise Lever, MD  warfarin (COUMADIN) 7.5 MG tablet Take 1 & 1/2 to 2 tablets by mouth daily as directed 11/21/13  Yes Tommy Medal, RPH-CPP  albuterol (PROVENTIL) (2.5 MG/3ML) 0.083% nebulizer solution Take 3 mLs (2.5 mg total) by nebulization every 6 (six) hours as needed. 04/28/11 10/22/13  Deneise Lever, MD   History   Social History  . Marital Status: Divorced    Spouse Name: N/A    Number of Children: 2  . Years of Education: N/A   Occupational  History  . N[employer]SING [employer]    Social History Main Topics  . Smoking status: Never Smoker   . Smokeless tobacco: Never Used  . Alcohol Use: No  . Drug Use: No  . Sexual Activity: Not on file   Other Topics Concern  . Not on file   Social History Narrative  . No narrative on file    Review of Systems  Cardiovascular: Positive for leg swelling.    Objective:  Physical Exam  Nursing note and vitals reviewed. Constitutional: She is oriented to person, place, and time. She appears well-developed and well-nourished.  HENT:  Head: Normocephalic and atraumatic.  Eyes: EOM are normal.  Neck: Normal range of motion. Neck supple. No thyromegaly present.  Cardiovascular: Normal rate, regular rhythm and normal heart sounds.   No murmur heard. Pulmonary/Chest: Effort normal and breath sounds normal. No respiratory distress. She has no wheezes. She has no rales.  Musculoskeletal: Normal range of motion.  Lymphadenopathy:    She has no cervical adenopathy.  Neurological: She is alert and oriented to person, place, and time.  Skin: Skin is warm and dry.  Psychiatric: She has a normal mood and affect. Her behavior is normal.    Results for orders placed in visit on 05/07/14  GLUCOSE, POCT (MANUAL RESULT ENTRY)      Result Value Ref Range   POC Glucose 114 (*) 70 - 99 mg/dl  POCT GLYCOSYLATED HEMOGLOBIN (HGB A1C)      Result Value Ref  Range   Hemoglobin A1C 6.5      BP 140/84  Pulse 80  Temp(Src) 98 F (36.7 C) (Oral)  Resp 16  Ht 5\' 2"  (1.575 m)  Wt 304 lb 9.6 oz (138.166 kg)  BMI 55.70 kg/m2  SpO2 98% Assessment & Plan:  Diabetes is stable. No change in medications. I encouraged her to consider Silver sneakers again but financially she is not quite able to do that. She has already had her flu shot we'll recheck 3-4 months.  I personally performed the services described in this documentation, which was scribed in my presence. The recorded information has been reviewed and is accurate.

## 2014-05-08 ENCOUNTER — Ambulatory Visit (INDEPENDENT_AMBULATORY_CARE_PROVIDER_SITE_OTHER): Payer: Medicare Other | Admitting: Pharmacist Clinician (PhC)/ Clinical Pharmacy Specialist

## 2014-05-08 DIAGNOSIS — Z7901 Long term (current) use of anticoagulants: Secondary | ICD-10-CM

## 2014-05-08 DIAGNOSIS — I480 Paroxysmal atrial fibrillation: Secondary | ICD-10-CM

## 2014-05-08 LAB — POCT INR: INR: 1.5

## 2014-05-09 ENCOUNTER — Ambulatory Visit (INDEPENDENT_AMBULATORY_CARE_PROVIDER_SITE_OTHER): Payer: No Typology Code available for payment source | Admitting: Psychology

## 2014-05-09 ENCOUNTER — Ambulatory Visit (INDEPENDENT_AMBULATORY_CARE_PROVIDER_SITE_OTHER): Payer: Medicare Other

## 2014-05-09 DIAGNOSIS — F4310 Post-traumatic stress disorder, unspecified: Secondary | ICD-10-CM

## 2014-05-09 DIAGNOSIS — F331 Major depressive disorder, recurrent, moderate: Secondary | ICD-10-CM

## 2014-05-09 DIAGNOSIS — J309 Allergic rhinitis, unspecified: Secondary | ICD-10-CM

## 2014-05-09 NOTE — Progress Notes (Signed)
   THERAPIST PROGRESS NOTE  Session Time: 48:25OI-37:04UG  Participation Level: Active  Behavioral Response: Well GroomedAlert, AFFECT WNL  Type of Therapy: Individual Therapy  Treatment Goals addressed: Diagnosis: MDD, PTSD and increasing self compassion for coping.  Interventions: CBT and Other: Self compassion  Summary: Michele [name] is a 63 y.o. female who presents with generally full and bright affect.  Pt reported she feels better w/ re: to IBS than her last visit.  Pt reported that she needs to focus on what else to do for self besides focus on gratitude and blessings in her life.  Pt reported she talked w/ PCP about some of her medical issues and he did inform that some things just aging process that no further tx for and focus on acceptance.  Pt discussed wanting to do more for her weight and increasing physcial activity level.  Pt was able to state that she believes she is too hard on self though and is increasing awareness and insight about this.  Pt receptive to need for increased self compassion for self w/ limitations/struggles.  Pt agreed that thinking of how she would talk and encouraged her grandchildren if in her position will be good practice for how to talk to self.    Suicidal/Homicidal: Nowithout intent/plan  Therapist Response: Assessed pt current functioning per pt report.  Processed w/pt mood and impact of self expectations and self talk on.  Discussed w/pt use of self compassion and how to practice w/ identifying how she would enocurage- talk w/ grandchildren should be how she also treats herself.  Provided pt w/ information about [facility] for free exercises classes and informing of financial assistance available at [facility] as pt discussed as barrier.   Plan: Return again in 2 weeks.  Diagnosis: Axis I: Post Traumatic Stress Disorder and MDD, recurrent mild    Axis II: No diagnosis    Oneal Schoenberger, LPC 05/09/2014

## 2014-05-14 ENCOUNTER — Other Ambulatory Visit: Payer: Self-pay | Admitting: Pharmacist Clinician (PhC)/ Clinical Pharmacy Specialist

## 2014-05-14 ENCOUNTER — Other Ambulatory Visit: Payer: Self-pay | Admitting: Internal Medicine

## 2014-05-16 ENCOUNTER — Ambulatory Visit (INDEPENDENT_AMBULATORY_CARE_PROVIDER_SITE_OTHER): Payer: Medicare Other

## 2014-05-16 DIAGNOSIS — J309 Allergic rhinitis, unspecified: Secondary | ICD-10-CM

## 2014-05-22 ENCOUNTER — Ambulatory Visit (INDEPENDENT_AMBULATORY_CARE_PROVIDER_SITE_OTHER): Payer: Medicare Other | Admitting: Pharmacist Clinician (PhC)/ Clinical Pharmacy Specialist

## 2014-05-22 DIAGNOSIS — Z7901 Long term (current) use of anticoagulants: Secondary | ICD-10-CM

## 2014-05-22 DIAGNOSIS — I480 Paroxysmal atrial fibrillation: Secondary | ICD-10-CM

## 2014-05-22 LAB — POCT INR: INR: 2.2

## 2014-05-23 ENCOUNTER — Ambulatory Visit (INDEPENDENT_AMBULATORY_CARE_PROVIDER_SITE_OTHER): Payer: Medicare Other

## 2014-05-23 DIAGNOSIS — J309 Allergic rhinitis, unspecified: Secondary | ICD-10-CM

## 2014-05-24 ENCOUNTER — Encounter: Payer: Self-pay | Admitting: Internal Medicine

## 2014-05-30 ENCOUNTER — Ambulatory Visit (INDEPENDENT_AMBULATORY_CARE_PROVIDER_SITE_OTHER): Payer: Medicare Other

## 2014-05-30 DIAGNOSIS — J309 Allergic rhinitis, unspecified: Secondary | ICD-10-CM

## 2014-05-31 ENCOUNTER — Ambulatory Visit (INDEPENDENT_AMBULATORY_CARE_PROVIDER_SITE_OTHER): Payer: No Typology Code available for payment source | Admitting: Psychology

## 2014-05-31 DIAGNOSIS — F4310 Post-traumatic stress disorder, unspecified: Secondary | ICD-10-CM

## 2014-05-31 DIAGNOSIS — F331 Major depressive disorder, recurrent, moderate: Secondary | ICD-10-CM

## 2014-05-31 NOTE — Progress Notes (Signed)
   THERAPIST PROGRESS NOTE  Session Time: 8..02am-8.48am  Participation Level: Active  Behavioral Response: Well GroomedAlert, AFFECT WNL  Type of Therapy: Individual Therapy  Treatment Goals addressed: Diagnosis: MDD, PTSD and goal 1.  Interventions: CBT and Strength-based  Summary: YANELLE SOUSA is a 63 y.o. female who presents with affect that is bright today.  Pt reported that she didn't sleep well last night as worried would over sleep, but did well getting up and getting here today. Pt reported that she has been "fighting off" depressed mood and has focused on keeping positive, grateful and aware of not talking negative of self.  Pt acknowledged that this holiday season is a difficult time for her w/ losses of loved ones.  Pt discussed how can keep in remembrance and connected w/ loved ones that are still present to her.  Pt discussed her connection to grandchildren and positive interactions they bring.   Suicidal/Homicidal: Nowithout intent/plan  Therapist Response: Assessed pt current functioning per pt report.  Processed w/pt her mood and how she is effectively using her coping skills and staying engaged w/ her supports- loved ones.  Acknowledged holiday season and effect can have w/ missing those deceased.  Explored w/pt ways to have involved w/ remembrance.  Plan: Return again in 2 weeks.  Diagnosis:MDD and PTSD       Donaldo Teegarden, [facility] 05/31/2014

## 2014-06-06 ENCOUNTER — Ambulatory Visit (INDEPENDENT_AMBULATORY_CARE_PROVIDER_SITE_OTHER): Payer: Medicare Other

## 2014-06-06 DIAGNOSIS — J309 Allergic rhinitis, unspecified: Secondary | ICD-10-CM

## 2014-06-07 ENCOUNTER — Other Ambulatory Visit: Payer: Self-pay | Admitting: Physician Assistant

## 2014-06-10 ENCOUNTER — Encounter: Payer: Self-pay | Admitting: Internal Medicine

## 2014-06-12 ENCOUNTER — Ambulatory Visit: Payer: Self-pay

## 2014-06-19 ENCOUNTER — Ambulatory Visit (INDEPENDENT_AMBULATORY_CARE_PROVIDER_SITE_OTHER): Payer: Medicare Other | Admitting: Pharmacist Clinician (PhC)/ Clinical Pharmacy Specialist

## 2014-06-19 DIAGNOSIS — I480 Paroxysmal atrial fibrillation: Secondary | ICD-10-CM

## 2014-06-19 DIAGNOSIS — Z7901 Long term (current) use of anticoagulants: Secondary | ICD-10-CM

## 2014-06-19 LAB — POCT INR: INR: 2.3

## 2014-06-20 ENCOUNTER — Ambulatory Visit (INDEPENDENT_AMBULATORY_CARE_PROVIDER_SITE_OTHER): Payer: No Typology Code available for payment source | Admitting: Psychology

## 2014-06-20 ENCOUNTER — Ambulatory Visit (INDEPENDENT_AMBULATORY_CARE_PROVIDER_SITE_OTHER): Payer: Medicare Other

## 2014-06-20 DIAGNOSIS — F331 Major depressive disorder, recurrent, moderate: Secondary | ICD-10-CM

## 2014-06-20 DIAGNOSIS — F4310 Post-traumatic stress disorder, unspecified: Secondary | ICD-10-CM

## 2014-06-20 DIAGNOSIS — J309 Allergic rhinitis, unspecified: Secondary | ICD-10-CM

## 2014-06-20 NOTE — Progress Notes (Signed)
   THERAPIST PROGRESS NOTE  Session Time: 10.05am-10.55am  Participation Level: Active  Behavioral Response: Well GroomedAlertDepressed  Type of Therapy: Individual Therapy  Treatment Goals addressed: Diagnosis: MDD, PTSD and coping.  Interventions: CBT and Supportive  Summary: Michele [name] is a 63 y.o. female who presents with affect that is congruent w/ report of feeling down. Pt reported that she has been experiencing a lot of pain and not feeling much relief from and aware it is effecting her mood.  Pt also discussed feeling lonely although has siblings around.  Pt reported that she currently has a stress headache as well- and reported on some stressors.  Pt focused on identify getting settle for self w/ plans upcoming month and identify a way to relax for self today and daily to get her "happy juices flowing".   Pt does report that she has a professional massage in 1.5 weeks and will focus on self massage w/ getting hair washed today.   Suicidal/Homicidal: Nowithout intent/plan  Therapist Response: Assessed pt current functioning per pt report.  Processed w/ pt her pain level and assisted pt with awareness of how this is effecting mood.  Explored w/pt ways to give moments of relief w / self massage and other relaxation techniques.  Explored w/pt self care for creating enjoyable experiences.   Plan: Return again in 2 weeks.  Diagnosis: : MDD, Rolene Course, LPC 06/20/2014

## 2014-06-27 ENCOUNTER — Ambulatory Visit: Payer: Medicare Other | Admitting: Emergency Medicine

## 2014-06-27 ENCOUNTER — Ambulatory Visit: Payer: Self-pay

## 2014-07-01 ENCOUNTER — Ambulatory Visit (INDEPENDENT_AMBULATORY_CARE_PROVIDER_SITE_OTHER): Payer: Medicare Other | Admitting: Emergency Medicine

## 2014-07-01 ENCOUNTER — Encounter: Payer: Self-pay | Admitting: Emergency Medicine

## 2014-07-01 VITALS — BP 138/76 | HR 94 | Temp 98.9°F | Resp 18 | Ht 62.25 in | Wt 302.6 lb

## 2014-07-01 DIAGNOSIS — Z0100 Encounter for examination of eyes and vision without abnormal findings: Secondary | ICD-10-CM

## 2014-07-01 DIAGNOSIS — E119 Type 2 diabetes mellitus without complications: Secondary | ICD-10-CM

## 2014-07-01 NOTE — Progress Notes (Signed)
   Subjective:    Patient ID: Michele [name], female    DOB: [birth year], 63 y.o.   MRN: [id number] [name], MD by Marti Sleigh, Medical Scribe. This patient was seen in Room 21 and the patient's care was started at 8:24 AM.  Chief Complaint  Patient presents with  . Eye Problem    isurance wants her to verify that she had her vision checked     HPI HPI Comments: Michele [name] is a 63 y.o. female HTN, CAD, DM, PTSD and sleep apnea who presents to [facility] needing a vision follow up for her insurance. Pt states she was called by her insurance and told to get a vision checkup and a colonoscopy. Pt thought she had previously fulfilled all these requirements. Pt states she has visited the [place], Dr. Edyth Gunnels at [facility] on [address] in [place] for her diabetic eye check. Pt states she is UTD on her colonoscopy.     Review of Systems  Constitutional: Negative for fever and chills.  Respiratory: Negative for cough and shortness of breath.   Cardiovascular: Negative for chest pain.        Objective:   Physical Exam  Constitutional: She is oriented to person, place, and time. She appears well-developed and well-nourished.  HENT:  Head: Normocephalic and atraumatic.  Eyes: Pupils are equal, round, and reactive to light.  Neck: Neck supple.  Cardiovascular: Normal rate and regular rhythm.   Pulmonary/Chest: Effort normal and breath sounds normal. No respiratory distress.  Neurological: She is alert and oriented to person, place, and time.  Skin: Skin is warm and dry.  Psychiatric: She has a normal mood and affect. Her behavior is normal.  Nursing note and vitals reviewed.      Assessment & Plan:   Her eye exam here is normal. Will get results of high exam from her optometrist. We will forward these to the appropriate carrier.I personally performed the services described in this documentation, which was scribed in my  presence. The recorded information has been reviewed and is accurate.

## 2014-07-02 ENCOUNTER — Ambulatory Visit (INDEPENDENT_AMBULATORY_CARE_PROVIDER_SITE_OTHER): Payer: No Typology Code available for payment source | Admitting: Psychology

## 2014-07-02 DIAGNOSIS — F331 Major depressive disorder, recurrent, moderate: Secondary | ICD-10-CM

## 2014-07-02 DIAGNOSIS — F4310 Post-traumatic stress disorder, unspecified: Secondary | ICD-10-CM

## 2014-07-02 NOTE — Progress Notes (Signed)
   THERAPIST PROGRESS NOTE  Session Time: 12.30pm-1.25pm  Participation Level: Active  Behavioral Response: Well GroomedAlertDepressed  Type of Therapy: Individual Therapy  Treatment Goals addressed: Diagnosis: MDD, PTSD and goal 1.  Interventions: CBT, Supportive and Other: Wellness approach  Summary: Michele [name] is a 63 y.o. female who presents with report of some sadness and feeling lonely missing loved ones.  Pt reports that she is keeping actively engaged w/ sisters and attempting to keep present not dwell in past. Pt aware that of need for continued self care activities for self.  Pt discussed engagement in shared experiences even w/ family that isn't local and this is helpful.  Pt did discuss ways she can create again- sewing or adult coloring- and how can use for self care.   Suicidal/Homicidal: Nowithout intent/plan  Therapist Response: Assessed pt current functioning per pt report.  Processed w/pt mood and validated feelings.  Encouraged pt to not ignore feelings of grief and sadness that are coming up- but to utilize coping skills and self care to assist through this time.   Plan: Return again in 2 weeks.  Diagnosis:  Post Traumatic Stress Disorder and MDD       Michele [name], [facility] 07/02/2014

## 2014-07-03 ENCOUNTER — Telehealth: Payer: Self-pay

## 2014-07-04 ENCOUNTER — Other Ambulatory Visit: Payer: Self-pay | Admitting: Cardiovascular Disease

## 2014-07-04 ENCOUNTER — Ambulatory Visit (INDEPENDENT_AMBULATORY_CARE_PROVIDER_SITE_OTHER): Payer: Medicare Other

## 2014-07-04 ENCOUNTER — Other Ambulatory Visit (HOSPITAL_COMMUNITY): Payer: Self-pay | Admitting: Psychiatry

## 2014-07-04 ENCOUNTER — Other Ambulatory Visit: Payer: Self-pay | Admitting: Internal Medicine

## 2014-07-04 ENCOUNTER — Telehealth (HOSPITAL_COMMUNITY): Payer: Self-pay

## 2014-07-04 DIAGNOSIS — J309 Allergic rhinitis, unspecified: Secondary | ICD-10-CM

## 2014-07-04 NOTE — Telephone Encounter (Signed)
Given on 04/24/14 for 90 days.

## 2014-07-04 NOTE — Telephone Encounter (Signed)
Rx was sent to pharmacy electronically. OV 07/24/14

## 2014-07-05 NOTE — Telephone Encounter (Signed)
A user error has taken place: encounter opened in error, closed for administrative reasons.

## 2014-07-11 ENCOUNTER — Ambulatory Visit: Payer: Self-pay

## 2014-07-16 ENCOUNTER — Ambulatory Visit (INDEPENDENT_AMBULATORY_CARE_PROVIDER_SITE_OTHER): Payer: No Typology Code available for payment source | Admitting: Psychology

## 2014-07-16 DIAGNOSIS — F4310 Post-traumatic stress disorder, unspecified: Secondary | ICD-10-CM

## 2014-07-16 DIAGNOSIS — F331 Major depressive disorder, recurrent, moderate: Secondary | ICD-10-CM

## 2014-07-16 NOTE — Progress Notes (Signed)
   THERAPIST PROGRESS NOTE  Session Time: 12.30pm-1.25pm  Participation Level: Active  Behavioral Response: Well GroomedAlert, AFFECT WNL  Type of Therapy: Individual Therapy  Treatment Goals addressed: Diagnosis: MDD, PTSD and goal 1.  Interventions: CBT  Summary: Michele [name] is a 63 y.o. female who presents with report of enjoying Christmas and time w/her family and feeling gratitude for grandchildren.  Pt reported that she did feel a little melancholy leading up to Christmas grieving her family that has passed.  Pt reported that she was able to focus on present and joy in present interactions.  Pt did reported on daughter threatening to come and "set her straight" about caring for her incontinence.  Pt was able to be aware that daughter can't relate to her problems and she is caring for herself best she can.  Pt discussed how to be proactive in caring for self towards further improvement w/mood and agrees to use self relaxation/meditation daily practice.   Suicidal/Homicidal: Nowithout intent/plan  Therapist Response: Assessed pt current functioning per pt report.  Processed w/ pt her interactions and mood over the holidays and benefit of being present in the moment.  Explored w/pt her self care on day to day w/ actions towards more joy in her moments to herself.  Discussed focus on meditation/ relaxation practice and acknowledge what makes her a good person.   Plan: Return again in 3 weeks.  Diagnosis: MDD, PTSD       Larone Kliethermes, [place] 07/16/2014

## 2014-07-23 ENCOUNTER — Other Ambulatory Visit (HOSPITAL_COMMUNITY): Payer: Self-pay | Admitting: Psychiatry

## 2014-07-24 ENCOUNTER — Ambulatory Visit (INDEPENDENT_AMBULATORY_CARE_PROVIDER_SITE_OTHER): Payer: Medicare Other | Admitting: Cardiovascular Disease

## 2014-07-24 ENCOUNTER — Ambulatory Visit (INDEPENDENT_AMBULATORY_CARE_PROVIDER_SITE_OTHER): Payer: Medicare Other | Admitting: Pharmacist Clinician (PhC)/ Clinical Pharmacy Specialist

## 2014-07-24 ENCOUNTER — Encounter: Payer: Self-pay | Admitting: Cardiovascular Disease

## 2014-07-24 VITALS — BP 122/78 | HR 66 | Ht 62.0 in | Wt 303.8 lb

## 2014-07-24 DIAGNOSIS — Z79899 Other long term (current) drug therapy: Secondary | ICD-10-CM

## 2014-07-24 DIAGNOSIS — I10 Essential (primary) hypertension: Secondary | ICD-10-CM

## 2014-07-24 DIAGNOSIS — I480 Paroxysmal atrial fibrillation: Secondary | ICD-10-CM

## 2014-07-24 DIAGNOSIS — I2510 Atherosclerotic heart disease of native coronary artery without angina pectoris: Secondary | ICD-10-CM

## 2014-07-24 DIAGNOSIS — E785 Hyperlipidemia, unspecified: Secondary | ICD-10-CM

## 2014-07-24 DIAGNOSIS — Z7901 Long term (current) use of anticoagulants: Secondary | ICD-10-CM

## 2014-07-24 LAB — POCT INR: INR: 1.4

## 2014-07-24 NOTE — Assessment & Plan Note (Addendum)
History of PAF maintaining sinus rhythm on Coumadin anticoagulation

## 2014-07-24 NOTE — Assessment & Plan Note (Signed)
History of hyperlipidemia on Crestor 10 mg a day. We will recheck a lipid and liver profile

## 2014-07-24 NOTE — Progress Notes (Signed)
07/24/2014 Michele [name]   [birth year]  [id number]  Primary Physician [name], MD Primary Cardiologist: Lorretta Harp MD [place], FSCAI   HPI:  Michele [name] is a [phone].o. female with morbidly obese and who has a history of coronary disease status post stent to the proximal RCA in 2008. Her history also includes diabetes mellitus type 2, hypertension, sleep apnea, asthma, paroxysmal intrafibrillation, hyperlipidemia, arthritis. Her last nuclear stress test was 12/20/12 was nonischemic. She has normal LV function.. She has occasional atypical chest pain as well as dyspnea on exertion when walking up an incline.. Since I saw her a year ago she's remained chronically stable without change of her symptoms in either frequency or severity. She does have a history of paroxysmal A. Fib maintaining sinus rhythm on Coumadin anticoagulation. We talked about weight reduction which has been on major problem with her over the years. Unfortunately that she has not been able to lose weight despite seeing a dietitian. I do not think she is able to exercise.  Current Outpatient Prescriptions  Medication Sig Dispense Refill  . ARIPiprazole (ABILIFY) 15 MG tablet Take 1 tablet (15 mg total) by mouth daily. 90 tablet 0  . aspirin 81 MG tablet Take 81 mg by mouth daily.      . benztropine (COGENTIN) 0.5 MG tablet Take 1 tablet (0.5 mg total) by mouth at bedtime. 90 tablet 0  . CRESTOR 10 MG tablet TAKE ONE (1) TABLET BY MOUTH EVERY OTHER DAY 90 tablet 1  . diltiazem (CARDIZEM CD) 240 MG 24 hr capsule Take 1 capsule (240 mg total) by mouth daily. 90 capsule 2  . fexofenadine (ALLEGRA) 60 MG tablet Take 1 tablet (60 mg total) by mouth daily. Take 60 mg by mouth daily. 30 tablet 11  . Fluticasone-Salmeterol (ADVAIR DISKUS) 250-50 MCG/DOSE AEPB INHALE 1 PUFF INTO THE LUNGS EVERY 12 HOURS 3 each 1  . furosemide (LASIX) 40 MG tablet TAKE ONE (1) TABLET BY MOUTH EVERY DAY 30 tablet 0  .  HYDROcodone-acetaminophen (NORCO) 5-325 MG per tablet Take 1 tablet by mouth every 6 (six) hours as needed.      . isosorbide mononitrate (IMDUR) 30 MG 24 hr tablet TAKE ONE (1) TABLET BY MOUTH EVERY DAY 90 tablet 2  . metFORMIN (GLUCOPHAGE-XR) 500 MG 24 hr tablet TAKE (1) TABLET BY MOUTH EVERY DAY WITH BREAKFAST. 90 tablet 0  . metoprolol (LOPRESSOR) 100 MG tablet Take 100 mg by mouth daily.    . mometasone (NASONEX) 50 MCG/ACT nasal spray Place 2 sprays into the nose daily. 17 g 2  . morphine (MSIR) 15 MG tablet Take 1 by mouth at bedtime      . nitroGLYCERIN (NITROSTAT) 0.4 MG SL tablet Place 1 tablet (0.4 mg total) under the tongue every 5 (five) minutes as needed. 25 tablet 3  . pantoprazole (PROTONIX) 20 MG tablet Take 20 mg by mouth daily.    . potassium chloride (KLOR-CON) 20 MEQ packet Take 1 by mouth daily     . sertraline (ZOLOFT) 100 MG tablet Take 2 by mouth once daily 180 tablet 0  . Trospium Chloride 60 MG CP24 TAKE ONE CAPSULE BY MOUTH DAILY 90 each 1  . valsartan-hydrochlorothiazide (DIOVAN-HCT) 160-12.5 MG per tablet Take 1 tablet by mouth daily. 90 tablet 2  . VENTOLIN HFA 108 (90 BASE) MCG/ACT inhaler INHALE TWO PUFFS INTO THE LUNGS EVERY SIX HOURS AS NEEDED 18 g 1  . warfarin (COUMADIN) 7.5 MG tablet TAKE  ONE AND ONE-HALF (1 1/2) TO TWO (2)TABLETS BY MOUTH DAILY AS DIRECTED 165 tablet 1  . albuterol (PROVENTIL) (2.5 MG/3ML) 0.083% nebulizer solution Take 3 mLs (2.5 mg total) by nebulization every 6 (six) hours as needed. 360 mL 2  . [DISCONTINUED] SANCTURA XR 60 MG CP24 Take 1 capsule by mouth daily.     No current facility-administered medications for this visit.    Allergies  Allergen Reactions  . Meprobamate Nausea And Vomiting    History   Social History  . Marital Status: Divorced    Spouse Name: N/A    Number of Children: 2  . Years of Education: N/A   Occupational History  . N[employer]SING [employer]    Social History Main Topics  . Smoking status: Never Smoker     . Smokeless tobacco: Never Used  . Alcohol Use: No  . Drug Use: No  . Sexual Activity: Not on file   Other Topics Concern  . Not on file   Social History Narrative     Review of Systems: General: negative for chills, fever, night sweats or weight changes.  Cardiovascular: negative for chest pain, dyspnea on exertion, edema, orthopnea, palpitations, paroxysmal nocturnal dyspnea or shortness of breath Dermatological: negative for rash Respiratory: negative for cough or wheezing Urologic: negative for hematuria Abdominal: negative for nausea, vomiting, diarrhea, bright red blood per rectum, melena, or hematemesis Neurologic: negative for visual changes, syncope, or dizziness All other systems reviewed and are otherwise negative except as noted above.    Blood pressure 122/78, pulse 66, height 5' 2"  (1.575 m), weight 303 lb 12.8 oz (137.803 kg).  General appearance: alert and no distress Neck: no adenopathy, no carotid bruit, no JVD, supple, symmetrical, trachea midline and thyroid not enlarged, symmetric, no tenderness/mass/nodules Lungs: clear to auscultation bilaterally Heart: regular rate and rhythm, S1, S2 normal, no murmur, click, rub or gallop Extremities: extremities normal, atraumatic, no cyanosis or edema  EKG normal sinus rhythm at 66 with nonspecific ST and T-wave changes unchanged from prior EKGs. I personally reviewed this EKG  ASSESSMENT AND PLAN:   Paroxysmal a-fib History of PAF maintaining sinus rhythm on Coumadin anticoagulation  Essential hypertension History of hypertension with blood pressure measured today at 120/78. She is on Cardizem CD 240, metoprolol, valsartan hydrochlorothiazide. Continue current meds at current dosing  Coronary atherosclerosis History of CAD status post proximal RCA stenting by Dr. Ellouise Newer 03/29/07. She has had multiple negative Myoview since that time for atypical chest pain.  Hyperlipidemia History of hyperlipidemia on  Crestor 10 mg a day. We will recheck a lipid and liver profile      Lorretta Harp MD [facility], [facility] 07/24/2014 11:13 AM

## 2014-07-24 NOTE — Assessment & Plan Note (Signed)
History of hypertension with blood pressure measured today at 120/78. She is on Cardizem CD 240, metoprolol, valsartan hydrochlorothiazide. Continue current meds at current dosing

## 2014-07-24 NOTE — Assessment & Plan Note (Signed)
History of CAD status post proximal RCA stenting by Dr. Ellouise Newer 03/29/07. She has had multiple negative Myoview since that time for atypical chest pain.

## 2014-07-24 NOTE — Patient Instructions (Signed)
Your physician wants you to follow-up in 1 year with Dr. Gwenlyn Found. You will receive a reminder letter in the mail 2 months in advance. If you do not receive a letter, please call our office to schedule the follow-up appointment.  Dr. Gwenlyn Found has ordered for you to have lab work done in the next week or two, you MUST be FASTING.

## 2014-07-25 ENCOUNTER — Ambulatory Visit (INDEPENDENT_AMBULATORY_CARE_PROVIDER_SITE_OTHER): Payer: No Typology Code available for payment source | Admitting: Psychiatry

## 2014-07-25 ENCOUNTER — Encounter (HOSPITAL_COMMUNITY): Payer: Self-pay | Admitting: Psychiatry

## 2014-07-25 ENCOUNTER — Ambulatory Visit (INDEPENDENT_AMBULATORY_CARE_PROVIDER_SITE_OTHER): Payer: Medicare Other

## 2014-07-25 VITALS — BP 109/70 | HR 73 | Ht 62.0 in | Wt 305.2 lb

## 2014-07-25 DIAGNOSIS — F323 Major depressive disorder, single episode, severe with psychotic features: Secondary | ICD-10-CM

## 2014-07-25 DIAGNOSIS — F4310 Post-traumatic stress disorder, unspecified: Secondary | ICD-10-CM

## 2014-07-25 DIAGNOSIS — F331 Major depressive disorder, recurrent, moderate: Secondary | ICD-10-CM

## 2014-07-25 DIAGNOSIS — J309 Allergic rhinitis, unspecified: Secondary | ICD-10-CM

## 2014-07-25 MED ORDER — SERTRALINE HCL 100 MG PO TABS: ORAL_TABLET | ORAL | Status: DC

## 2014-07-25 MED ORDER — ARIPIPRAZOLE 15 MG PO TABS: 15.0000 mg | ORAL_TABLET | Freq: Every day | ORAL | Status: DC

## 2014-07-25 MED ORDER — BENZTROPINE MESYLATE 0.5 MG PO TABS: 0.5000 mg | ORAL_TABLET | Freq: Every day | ORAL | Status: DC
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
## 2014-07-29 ENCOUNTER — Encounter: Payer: Self-pay | Admitting: Emergency Medicine

## 2014-08-01 ENCOUNTER — Ambulatory Visit (INDEPENDENT_AMBULATORY_CARE_PROVIDER_SITE_OTHER): Payer: Medicare Other

## 2014-08-01 DIAGNOSIS — J309 Allergic rhinitis, unspecified: Secondary | ICD-10-CM

## 2014-08-02 LAB — LIPID PANEL
CHOL/HDL RATIO: 2.9 ratio
Cholesterol: 164 mg/dL (ref 0–200)
HDL: 57 mg/dL (ref >39)
LDL Cholesterol: 82 mg/dL (ref 0–99)
Triglycerides: 124 mg/dL (ref <150)
VLDL: 25 mg/dL (ref 0–40)

## 2014-08-02 LAB — HEPATIC FUNCTION PANEL
ALBUMIN: 4.1 g/dL (ref 3.5–5.2)
ALT: 12 U/L (ref 0–35)
AST: 13 U/L (ref 0–37)
Alkaline Phosphatase: 89 U/L (ref 39–117)
BILIRUBIN DIRECT: 0.1 mg/dL (ref 0.0–0.3)
BILIRUBIN INDIRECT: 0.4 mg/dL (ref 0.2–1.2)
TOTAL PROTEIN: 7.4 g/dL (ref 6.0–8.3)
Total Bilirubin: 0.5 mg/dL (ref 0.2–1.2)

## 2014-08-05 ENCOUNTER — Encounter: Payer: Self-pay | Admitting: *Deleted

## 2014-08-06 ENCOUNTER — Encounter: Payer: Self-pay | Admitting: Emergency Medicine

## 2014-08-06 ENCOUNTER — Ambulatory Visit (INDEPENDENT_AMBULATORY_CARE_PROVIDER_SITE_OTHER): Payer: Medicare Other | Admitting: Emergency Medicine

## 2014-08-06 VITALS — BP 130/90 | HR 78 | Temp 98.4°F | Resp 16 | Ht 62.0 in | Wt 302.0 lb

## 2014-08-06 DIAGNOSIS — E119 Type 2 diabetes mellitus without complications: Secondary | ICD-10-CM

## 2014-08-06 LAB — POCT GLYCOSYLATED HEMOGLOBIN (HGB A1C): Hemoglobin A1C: 6.4

## 2014-08-06 LAB — GLUCOSE, POCT (MANUAL RESULT ENTRY): POC Glucose: 81 mg/dl (ref 70–99)

## 2014-08-06 NOTE — Progress Notes (Addendum)
Subjective:  [name], MD by Donato Schultz, Medical Scribe. This patient was seen in Room 24 and the patient's care was started at 11:13 AM.   Patient ID: Michele [name], female    DOB: 1950/09/30, 64 y.o.   MRN: [id number]  HPI HPI Comments: Michele [name] is a 64 y.o. female who presents to the [facility] for a follow-up exam.  She has a diabetic eye check appointment with an ophthalmologist next week.  She was last seen here on 07/01/2014 and her last A1C was checked in October.  She states that her A-Fib is intermittent.  She is seeing her cardiologist and psychiatrist regularly.  She sees Dr. Inda Merlin every 3-6 months.  She has received her flu shot this year.  She has been exercising at home.  She stopped going to [employer] due to bilateral knee pain and the cold weather.     Patient Active Problem List   Diagnosis Date Noted  . Hyperlipidemia 07/17/2013  . Chest pain on exertion 12/14/2012  . Long term (current) use of anticoagulants 10/03/2012  . Major depressive disorder, recurrent episode, moderate 09/02/2011  . Menopausal and perimenopausal disorder 05/27/2011  . Depression 05/27/2011  . Urethrocele 05/27/2011  . Anemia, iron deficiency 05/27/2011  . Paroxysmal a-fib 05/27/2011  . Vitamin d deficiency 05/27/2011  . PTSD (post-traumatic stress disorder) 05/27/2011  . Abnormal brain MRI 05/27/2011  . PAD (peripheral artery disease) 05/27/2011  . PFO (patent foramen ovale) 05/27/2011  . Diabetes mellitus type II   . DM 12/16/2009  . 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 SLEEP APNEA, OBSTRUCTIVE 05/10/2007  . Essential hypertension 05/10/2007  . Coronary atherosclerosis 05/10/2007  . Seasonal and perennial allergic rhinitis 05/10/2007  . 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 REFLUX, ESOPHAGEAL 05/10/2007   Past Medical History  Diagnosis Date  . Allergic rhinitis   . Asthma   . CAD (coronary artery  disease)   . Hypertension   . Sleep apnea   . Diabetes mellitus type II   . Depression   . PTSD (post-traumatic stress disorder)   . 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 Carpal tunnel syndrome, right   . Hyperlipidemia   . Paroxysmal a-fib   . CAD (coronary artery disease) 01/30/2007    stents trivial coronary artery disease diffusely, with a recent deployment od a intracoronary artery stent, 3.5 x 12 mm driver stent with no more than 20-30% in- stents restenosis.done by Dr [name] with re-look on novenber 10 2008 revealing a widely patent stent with otherwise trival CAD and normal LV function  . Chest pain 07/17/12009    2 D Echo EF >55%  . Obesity    Past Surgical History  Procedure Laterality Date  . Cholecystectomy    . Left groin cyst    . Kidney stones    . Stress myocardial perfusion study  09/08/2010    normal; EF 75%  . Multilevel lumbar degenerative disc disease    . Hemorrhoid surgery    . Gallbladder surgery     Allergies  Allergen Reactions  . Meprobamate Nausea And Vomiting   Prior to Admission medications   Medication Sig Start Date End Date Taking? Authorizing Provider  albuterol (PROVENTIL) (2.5 MG/3ML) 0.083% nebulizer solution Take 3 mLs (2.5 mg total) by nebulization every 6 (six) hours as needed. 04/28/11 10/22/13  Deneise Lever, MD  ARIPiprazole (ABILIFY) 15 MG tablet Take  1 tablet (15 mg total) by mouth daily. 07/25/14   Kathlee Nations, MD  aspirin 81 MG tablet Take 81 mg by mouth daily.      Historical Provider, MD  benztropine (COGENTIN) 0.5 MG tablet Take 1 tablet (0.5 mg total) by mouth at bedtime. 07/25/14   Kathlee Nations, MD  CRESTOR 10 MG tablet TAKE ONE (1) TABLET BY MOUTH EVERY OTHER DAY 08/02/13   [name], MD  diltiazem (CARDIZEM CD) 240 MG 24 hr capsule Take 1 capsule (240 mg total) by mouth daily. 11/06/13   Lorretta Harp, MD  fexofenadine (ALLEGRA) 60 MG tablet Take 1 tablet (60 mg total) by mouth daily. Take 60 mg by mouth  daily. 12/16/10   Deneise Lever, MD  Fluticasone-Salmeterol (ADVAIR DISKUS) 250-50 MCG/DOSE AEPB INHALE 1 PUFF INTO THE LUNGS EVERY 12 HOURS 11/06/13   Deneise Lever, MD  furosemide (LASIX) 40 MG tablet TAKE ONE (1) TABLET BY MOUTH EVERY DAY 07/04/14   Lorretta Harp, MD  HYDROcodone-acetaminophen (NORCO) 5-325 MG per tablet Take 1 tablet by mouth every 6 (six) hours as needed.      Historical Provider, MD  isosorbide mononitrate (IMDUR) 30 MG 24 hr tablet TAKE ONE (1) TABLET BY MOUTH EVERY DAY 09/18/13   Lorretta Harp, MD  metFORMIN (GLUCOPHAGE-XR) 500 MG 24 hr tablet TAKE (1) TABLET BY MOUTH EVERY DAY WITH BREAKFAST. 06/09/14   Mancel Bale, PA-C  metoprolol (LOPRESSOR) 100 MG tablet Take 100 mg by mouth daily. 08/02/13   Historical Provider, MD  mometasone (NASONEX) 50 MCG/ACT nasal spray Place 2 sprays into the nose daily. 10/23/12   Deneise Lever, MD  morphine (MSIR) 15 MG tablet Take 1 by mouth at bedtime      Historical Provider, MD  nitroGLYCERIN (NITROSTAT) 0.4 MG SL tablet Place 1 tablet (0.4 mg total) under the tongue every 5 (five) minutes as needed. 12/14/12   Brett Canales, PA-C  pantoprazole (PROTONIX) 20 MG tablet Take 20 mg by mouth daily.    Historical Provider, MD  potassium chloride (KLOR-CON) 20 MEQ packet Take 1 by mouth daily     Historical Provider, MD  sertraline (ZOLOFT) 100 MG tablet Take 2 by mouth once daily 07/25/14   Kathlee Nations, MD  Trospium Chloride 60 MG CP24 TAKE ONE CAPSULE BY MOUTH DAILY 11/06/13   Collene Leyden, PA-C  valsartan-hydrochlorothiazide (DIOVAN-HCT) 160-12.5 MG per tablet Take 1 tablet by mouth daily. 11/06/13   Lorretta Harp, MD  VENTOLIN HFA 108 (90 BASE) MCG/ACT inhaler INHALE TWO PUFFS INTO THE LUNGS EVERY SIX HOURS AS NEEDED 07/04/14   Deneise Lever, MD  warfarin (COUMADIN) 7.5 MG tablet TAKE ONE AND ONE-HALF (1 1/2) TO TWO (2)TABLETS BY MOUTH DAILY AS DIRECTED 05/14/14   Tommy Medal, RPH-CPP   History   Social History  .  Marital Status: Divorced    Spouse Name: N/A    Number of Children: 2  . Years of Education: N/A   Occupational History  . N[employer]SING [employer]    Social History Main Topics  . Smoking status: Never Smoker   . Smokeless tobacco: Never Used  . Alcohol Use: No  . Drug Use: No  . Sexual Activity: Not on file   Other Topics Concern  . Not on file   Social History Narrative     Review of Systems   Objective:  Physical Exam  Constitutional: She is oriented to person, place, and time.  She appears well-developed and well-nourished.  HENT:  Head: Normocephalic and atraumatic.  Eyes: EOM are normal.  Neck: Normal range of motion. Neck supple. No thyromegaly present.  Cardiovascular: Normal rate, regular rhythm and normal heart sounds.  Exam reveals no gallop and no friction rub.   No murmur heard. Pulmonary/Chest: Effort normal.  Musculoskeletal: Normal range of motion. She exhibits no edema.  Lymphadenopathy:    She has no cervical adenopathy.  Neurological: She is alert and oriented to person, place, and time.  Skin: Skin is warm and dry.  Psychiatric: She has a normal mood and affect. Her behavior is normal.  Nursing note and vitals reviewed.   BP 130/90 mmHg  Pulse 78  Temp(Src) 98.4 F (36.9 C) (Oral)  Resp 16  Ht 5\' 2"  (1.575 m)  Wt 302 lb (136.986 kg)  BMI 55.22 kg/m2  SpO2 94% Assessment & Plan:  She is morbidly obese, sitting in her chair, and ambulates with a cane but her heart exam was RRR.  She is not in atrial fibrillation currently. Hemoglobin A1c is 6.4 at goal no change in medications. Recheck 3-4 months.I personally performed the services described in this documentation, which was scribed in my presence. The recorded information has been reviewed and is accurate.

## 2014-08-08 ENCOUNTER — Ambulatory Visit (INDEPENDENT_AMBULATORY_CARE_PROVIDER_SITE_OTHER): Payer: Medicare Other

## 2014-08-08 ENCOUNTER — Ambulatory Visit (INDEPENDENT_AMBULATORY_CARE_PROVIDER_SITE_OTHER): Payer: No Typology Code available for payment source | Admitting: Psychology

## 2014-08-08 DIAGNOSIS — F4310 Post-traumatic stress disorder, unspecified: Secondary | ICD-10-CM

## 2014-08-08 DIAGNOSIS — J309 Allergic rhinitis, unspecified: Secondary | ICD-10-CM

## 2014-08-08 DIAGNOSIS — F331 Major depressive disorder, recurrent, moderate: Secondary | ICD-10-CM

## 2014-08-08 NOTE — Progress Notes (Signed)
   THERAPIST PROGRESS NOTE  Session Time: 12.33pm-1.20pm  Participation Level: Active  Behavioral Response: Well GroomedAlert, Affect Bright and full  Type of Therapy: Individual Therapy  Treatment Goals addressed: Diagnosis: MDD and goal 1.  Interventions: CBT and Strength-based  Summary: Michele [name] is a 64 y.o. female who presents with generally full and bright affect.  Pt was winded coming into the office- but reports that she is feeling "good to be here for this day".  Pt reported that overall depression has been improved- had some days of melancholy as anniversary of brother's death.  Pt discussed how she is focusing on being present to the day and enjoying present interactions.  Pt also reported that easy for worries to creep up and she is coping through naming anxiety and reframing to let go and know that she is doing best she can.    Suicidal/Homicidal: Nowithout intent/plan  Therapist Response: Assessed pt current functioning per pt report.  Processed w/pt how she is focusing to keep her "positive juices" flowing and being present to find joy in day. Assisted pt in recognizing how she is and can practice self compassion w/ aging body.  Also discussed pt not ruminating and coping skills using to assist in this.   Plan: Return again in 3 weeks.  Diagnosis: MDD and PTSD   [name],[name], [facility] 08/08/2014

## 2014-08-14 ENCOUNTER — Ambulatory Visit (INDEPENDENT_AMBULATORY_CARE_PROVIDER_SITE_OTHER): Payer: Medicare Other | Admitting: Pharmacist Clinician (PhC)/ Clinical Pharmacy Specialist

## 2014-08-14 DIAGNOSIS — Z7901 Long term (current) use of anticoagulants: Secondary | ICD-10-CM

## 2014-08-14 DIAGNOSIS — I480 Paroxysmal atrial fibrillation: Secondary | ICD-10-CM

## 2014-08-14 LAB — POCT INR: INR: 3.5

## 2014-08-15 ENCOUNTER — Other Ambulatory Visit: Payer: Self-pay | Admitting: Cardiovascular Disease

## 2014-08-15 ENCOUNTER — Other Ambulatory Visit: Payer: Self-pay | Admitting: Emergency Medicine

## 2014-08-15 ENCOUNTER — Ambulatory Visit (INDEPENDENT_AMBULATORY_CARE_PROVIDER_SITE_OTHER): Payer: Medicare Other

## 2014-08-15 DIAGNOSIS — J309 Allergic rhinitis, unspecified: Secondary | ICD-10-CM

## 2014-08-15 NOTE — Telephone Encounter (Signed)
Rx has been sent to the pharmacy electronically. ° °

## 2014-08-21 ENCOUNTER — Ambulatory Visit (INDEPENDENT_AMBULATORY_CARE_PROVIDER_SITE_OTHER): Payer: Medicare Other

## 2014-08-21 ENCOUNTER — Ambulatory Visit: Payer: Self-pay

## 2014-08-21 DIAGNOSIS — J309 Allergic rhinitis, unspecified: Secondary | ICD-10-CM

## 2014-08-22 ENCOUNTER — Ambulatory Visit (INDEPENDENT_AMBULATORY_CARE_PROVIDER_SITE_OTHER): Payer: Medicare Other

## 2014-08-22 DIAGNOSIS — J309 Allergic rhinitis, unspecified: Secondary | ICD-10-CM

## 2014-08-29 ENCOUNTER — Ambulatory Visit (INDEPENDENT_AMBULATORY_CARE_PROVIDER_SITE_OTHER): Payer: Medicare Other

## 2014-08-29 DIAGNOSIS — J309 Allergic rhinitis, unspecified: Secondary | ICD-10-CM

## 2014-09-04 ENCOUNTER — Ambulatory Visit (INDEPENDENT_AMBULATORY_CARE_PROVIDER_SITE_OTHER): Payer: Medicare Other | Admitting: Pharmacist Clinician (PhC)/ Clinical Pharmacy Specialist

## 2014-09-04 DIAGNOSIS — I480 Paroxysmal atrial fibrillation: Secondary | ICD-10-CM

## 2014-09-04 DIAGNOSIS — Z7901 Long term (current) use of anticoagulants: Secondary | ICD-10-CM

## 2014-09-04 LAB — POCT INR: INR: 1.9

## 2014-09-05 ENCOUNTER — Ambulatory Visit: Payer: Self-pay

## 2014-09-05 ENCOUNTER — Ambulatory Visit (INDEPENDENT_AMBULATORY_CARE_PROVIDER_SITE_OTHER): Payer: No Typology Code available for payment source | Admitting: Psychology

## 2014-09-05 DIAGNOSIS — F4310 Post-traumatic stress disorder, unspecified: Secondary | ICD-10-CM

## 2014-09-05 DIAGNOSIS — F331 Major depressive disorder, recurrent, moderate: Secondary | ICD-10-CM

## 2014-09-05 NOTE — Progress Notes (Signed)
   THERAPIST PROGRESS NOTE  Session Time: 12.35pm-1.23pm  Participation Level: Active  Behavioral Response: Well GroomedAlertDepressed  Type of Therapy: Individual Therapy  Treatment Goals addressed: Diagnosis: MDD and goal 1.  Interventions: CBT and Strength-based  Summary: VIDALIA SERPAS is a 64 y.o. female who presents with report of increased pain today- w/ new pain in right leg when puts pressure on leg. Pt did report started after a fall several days ago.  Pt reported that she has been struggling w/ some mild-moderate depressed moods lately and avoiding negative self talk.  Pt had good insight about this and also how her pain in playing a role in this. Pt discussed more positive self talk and reframing to "keep her happy juices flowing".  Pt also discussed want for weight loss and healthy eating/exercise to increase her overall health. Pt reported that w/ support of sister's- improved w/ healthy meals- but pt still eating comfort foods for coping.  Pt aware of need to find other replacements for coping and discussed possiblities w/ counselor- mediation, creating comfort spot, comfort objects, etc.     Suicidal/Homicidal: Nowithout intent/plan  Therapist Response: Assessed pt current functioning per pt report.  Processed w/ pt contributing factors to mood and increased depressed moods.  Explored w/ pt ways of reframing and acknowledging and not using negative self talk.  Discussed w/pt plan for change w/ not eating for comfort/coping w/ using other coping skills.   Plan: Return again in 3 weeks.  Diagnosis: MDD, PTSD    Elyan Vanwieren, [place] 09/05/2014

## 2014-09-13 ENCOUNTER — Ambulatory Visit: Payer: Self-pay

## 2014-09-16 ENCOUNTER — Ambulatory Visit: Payer: Self-pay

## 2014-09-17 ENCOUNTER — Other Ambulatory Visit: Payer: Self-pay | Admitting: Internal Medicine

## 2014-09-19 ENCOUNTER — Ambulatory Visit (HOSPITAL_COMMUNITY): Payer: Self-pay | Admitting: Psychology

## 2014-09-19 ENCOUNTER — Ambulatory Visit (INDEPENDENT_AMBULATORY_CARE_PROVIDER_SITE_OTHER): Payer: Medicare Other

## 2014-09-19 DIAGNOSIS — J309 Allergic rhinitis, unspecified: Secondary | ICD-10-CM

## 2014-09-25 ENCOUNTER — Telehealth: Payer: Self-pay | Admitting: Pharmacist Clinician (PhC)/ Clinical Pharmacy Specialist

## 2014-09-26 ENCOUNTER — Ambulatory Visit (INDEPENDENT_AMBULATORY_CARE_PROVIDER_SITE_OTHER): Payer: Medicare Other

## 2014-09-26 ENCOUNTER — Ambulatory Visit (INDEPENDENT_AMBULATORY_CARE_PROVIDER_SITE_OTHER): Payer: Medicare Other | Admitting: *Deleted

## 2014-09-26 ENCOUNTER — Ambulatory Visit (HOSPITAL_COMMUNITY): Payer: Self-pay | Admitting: Psychology

## 2014-09-26 DIAGNOSIS — I480 Paroxysmal atrial fibrillation: Secondary | ICD-10-CM

## 2014-09-26 DIAGNOSIS — J309 Allergic rhinitis, unspecified: Secondary | ICD-10-CM

## 2014-09-26 DIAGNOSIS — Z7901 Long term (current) use of anticoagulants: Secondary | ICD-10-CM

## 2014-09-26 LAB — POCT INR: INR: 2

## 2014-09-26 NOTE — Telephone Encounter (Signed)
Closed encounter °

## 2014-09-27 ENCOUNTER — Ambulatory Visit: Payer: Self-pay | Admitting: Pharmacist Clinician (PhC)/ Clinical Pharmacy Specialist

## 2014-10-02 ENCOUNTER — Telehealth (HOSPITAL_COMMUNITY): Payer: Self-pay | Admitting: Psychology

## 2014-10-02 NOTE — Telephone Encounter (Signed)
Counselor called and left voice message to inform of need to change appointment from 11am to 11:30 am tomorrow 10/03/14.

## 2014-10-03 ENCOUNTER — Ambulatory Visit (HOSPITAL_COMMUNITY): Payer: Self-pay | Admitting: Psychology

## 2014-10-03 ENCOUNTER — Ambulatory Visit (INDEPENDENT_AMBULATORY_CARE_PROVIDER_SITE_OTHER): Payer: Medicare Other

## 2014-10-03 ENCOUNTER — Ambulatory Visit (INDEPENDENT_AMBULATORY_CARE_PROVIDER_SITE_OTHER): Payer: No Typology Code available for payment source | Admitting: Psychology

## 2014-10-03 DIAGNOSIS — J309 Allergic rhinitis, unspecified: Secondary | ICD-10-CM

## 2014-10-03 DIAGNOSIS — F331 Major depressive disorder, recurrent, moderate: Secondary | ICD-10-CM

## 2014-10-03 DIAGNOSIS — F4310 Post-traumatic stress disorder, unspecified: Secondary | ICD-10-CM

## 2014-10-03 NOTE — Progress Notes (Signed)
   THERAPIST PROGRESS NOTE  Session Time: 12pm-12:30pm  Participation Level: Active  Behavioral Response: Well GroomedAlertDepressed  Type of Therapy: Individual Therapy  Treatment Goals addressed: Diagnosis: MDD, PTSD and goal 1.  Interventions: CBT, Strength-based and Supportive  Summary: Michele [name] is a 64 y.o. female who presents with report of struggle with chronic pain.  Pt reports at time mood depressed but good insight about linked to pain- how that is limiting her and aware negative self talk with start.  Pt reported that she is talking w/ her sisters, brother, children and grandchildren regularly- but at times feels lonely that not as much social interaction. Pt reported that pain and slowness in the morning interferes with her making to church and Sunday school.  Pt is making an effort not to withdraw.  Pt reported that she wants to return to exercise and doctor has recommended to restart w/ senior aqua aerobics.  Pt struggles to wake and get moving to attend the early classes (8:30am time).  Pt was receptive to resources shared and states will look into what is best fit.   Suicidal/Homicidal: Nowithout intent/plan  Therapist Response: Assessed pt current functioning per pt report.  Processed w/pt her pain and how it is impacting her daily life- biggest barrier to accomplishing things she wants.  Discussed use of self compassion instead of negative self talk.  Explored w/pt ways to keep connected and assisted identifying plan that assist pt in getting over barriers to return to exercise and movement.  Plan: Return again in 2-3 weeks.  Diagnosis: MDD, PTSD   Damar Petit, [place] 10/03/2014

## 2014-10-09 ENCOUNTER — Other Ambulatory Visit: Payer: Self-pay | Admitting: Pharmacist Clinician (PhC)/ Clinical Pharmacy Specialist

## 2014-10-10 ENCOUNTER — Ambulatory Visit (INDEPENDENT_AMBULATORY_CARE_PROVIDER_SITE_OTHER): Payer: Medicare Other

## 2014-10-10 ENCOUNTER — Telehealth: Payer: Self-pay | Admitting: Cardiovascular Disease

## 2014-10-10 DIAGNOSIS — J309 Allergic rhinitis, unspecified: Secondary | ICD-10-CM

## 2014-10-10 NOTE — Telephone Encounter (Signed)
Per Answering Service: Returning somebody call from yesterday.

## 2014-10-10 NOTE — Telephone Encounter (Signed)
Rx(s) sent to pharmacy electronically.  

## 2014-10-17 ENCOUNTER — Ambulatory Visit (INDEPENDENT_AMBULATORY_CARE_PROVIDER_SITE_OTHER): Payer: Medicare Other

## 2014-10-17 ENCOUNTER — Ambulatory Visit (INDEPENDENT_AMBULATORY_CARE_PROVIDER_SITE_OTHER): Payer: No Typology Code available for payment source | Admitting: Psychology

## 2014-10-17 DIAGNOSIS — J309 Allergic rhinitis, unspecified: Secondary | ICD-10-CM | POA: Diagnosis not present

## 2014-10-17 DIAGNOSIS — F331 Major depressive disorder, recurrent, moderate: Secondary | ICD-10-CM

## 2014-10-17 DIAGNOSIS — F4310 Post-traumatic stress disorder, unspecified: Secondary | ICD-10-CM

## 2014-10-17 NOTE — Progress Notes (Signed)
   THERAPIST PROGRESS NOTE  Session Time: 10.45am-11.48am  Participation Level: Active  Behavioral Response: Neat and Well GroomedAlertDepressed  Type of Therapy: Individual Therapy  Treatment Goals addressed: Diagnosis: MDD, PTSD and goal 1.  Interventions: CBT, Strength-based and Supportive  Summary: Michele [name] is a 64 y.o. female who presents with affect congruent w/ report of mild depressed mood.  Pt reported that she was more severely depressed for a couple days at [place] as anniversary of her mother's death and pt struggled to feel motivation to get up and get out of her house.  Pt reported overall she feels improved w/ her depression- but still struggles w/ feeling lonely.  Pt acknowledged that she hadn't followed up w/ taking steps towards that gym for water aerobics and states she needs to be accountable to this. Pt reported that she will check w insurance to see if they assist w/ gym membership.  Pt also reported feeling anxiety/overwhelmed by insurance informing that one her medication will no longer be approved and deciding whether to complete paperwork for $16/month food assistance.  Pt discussed that keeping interactions w/ family and friends is healthy for her and will continue.     Suicidal/Homicidal: Nowithout intent/plan  Therapist Response: Assessed pt current functioning per pt report.  Processed w/pt increased depression and how related to grieving and coping skills w/ engaging w/ family and friends.  Acknowledged and validated loneliness and explored w/pt her f/u on plans discussed last session for engaging in community gym.  Explored w/pt barriers and commitment to follow through.    Plan: Return again in 3 weeks.  Diagnosis: MDD, PTSD    Fredi Hurtado, [place] 10/17/2014

## 2014-10-22 ENCOUNTER — Other Ambulatory Visit (HOSPITAL_BASED_OUTPATIENT_CLINIC_OR_DEPARTMENT_OTHER): Payer: Medicare Other

## 2014-10-22 ENCOUNTER — Other Ambulatory Visit: Payer: Self-pay | Admitting: Internal Medicine

## 2014-10-22 DIAGNOSIS — D649 Anemia, unspecified: Secondary | ICD-10-CM

## 2014-10-22 DIAGNOSIS — D509 Iron deficiency anemia, unspecified: Secondary | ICD-10-CM

## 2014-10-22 LAB — CBC WITH DIFFERENTIAL/PLATELET
BASO%: 0.8 % (ref 0.0–2.0)
BASOS ABS: 0.1 10*3/uL (ref 0.0–0.1)
EOS ABS: 0.2 10*3/uL (ref 0.0–0.5)
EOS%: 3 % (ref 0.0–7.0)
HEMATOCRIT: 37.7 % (ref 34.8–46.6)
HGB: 11.6 g/dL (ref 11.6–15.9)
LYMPH#: 2.6 10*3/uL (ref 0.9–3.3)
LYMPH%: 40.9 % (ref 14.0–49.7)
MCH: 21.3 pg — ABNORMAL LOW (ref 25.1–34.0)
MCHC: 30.8 g/dL — AB (ref 31.5–36.0)
MCV: 69.2 fL — ABNORMAL LOW (ref 79.5–101.0)
MONO#: 0.7 10*3/uL (ref 0.1–0.9)
MONO%: 10.7 % (ref 0.0–14.0)
NEUT#: 2.8 10*3/uL (ref 1.5–6.5)
NEUT%: 44.6 % (ref 38.4–76.8)
PLATELETS: 275 10*3/uL (ref 145–400)
RBC: 5.45 10*6/uL (ref 3.70–5.45)
RDW: 17.4 % — ABNORMAL HIGH (ref 11.2–14.5)
WBC: 6.4 10*3/uL (ref 3.9–10.3)

## 2014-10-22 LAB — IRON AND TIBC CHCC
%SAT: 17 % — ABNORMAL LOW (ref 21–57)
Iron: 51 ug/dL (ref 41–142)
TIBC: 310 ug/dL (ref 236–444)
UIBC: 259 ug/dL (ref 120–384)

## 2014-10-22 LAB — FERRITIN CHCC: FERRITIN: 215 ng/mL (ref 9–269)

## 2014-10-24 ENCOUNTER — Telehealth: Payer: Self-pay | Admitting: *Deleted

## 2014-10-24 ENCOUNTER — Ambulatory Visit (INDEPENDENT_AMBULATORY_CARE_PROVIDER_SITE_OTHER): Payer: Medicare Other

## 2014-10-24 ENCOUNTER — Encounter (HOSPITAL_COMMUNITY): Payer: Self-pay | Admitting: Psychiatry

## 2014-10-24 ENCOUNTER — Ambulatory Visit (INDEPENDENT_AMBULATORY_CARE_PROVIDER_SITE_OTHER): Payer: No Typology Code available for payment source | Admitting: Psychiatry

## 2014-10-24 VITALS — BP 125/76 | HR 77 | Ht 62.0 in | Wt 301.6 lb

## 2014-10-24 DIAGNOSIS — J309 Allergic rhinitis, unspecified: Secondary | ICD-10-CM

## 2014-10-24 DIAGNOSIS — F331 Major depressive disorder, recurrent, moderate: Secondary | ICD-10-CM

## 2014-10-24 DIAGNOSIS — F323 Major depressive disorder, single episode, severe with psychotic features: Secondary | ICD-10-CM

## 2014-10-24 DIAGNOSIS — F4310 Post-traumatic stress disorder, unspecified: Secondary | ICD-10-CM | POA: Diagnosis not present

## 2014-10-24 MED ORDER — ARIPIPRAZOLE 15 MG PO TABS: 15.0000 mg | ORAL_TABLET | Freq: Every day | ORAL | Status: DC

## 2014-10-24 MED ORDER — SERTRALINE HCL 100 MG PO TABS: ORAL_TABLET | ORAL | Status: DC

## 2014-10-24 MED ORDER — BENZTROPINE MESYLATE 0.5 MG PO TABS: 0.5000 mg | ORAL_TABLET | Freq: Every day | ORAL | Status: DC

## 2014-10-24 NOTE — Telephone Encounter (Signed)
[facility] was wanting to change this patient's valsartan hct 160/12.5mg  to losartan hct for insurance purposes.  I will defer to Dr Gwenlyn Found and Erasmo Downer.
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
## 2014-10-25 ENCOUNTER — Ambulatory Visit (INDEPENDENT_AMBULATORY_CARE_PROVIDER_SITE_OTHER): Payer: Medicare Other | Admitting: Pharmacist Clinician (PhC)/ Clinical Pharmacy Specialist

## 2014-10-25 ENCOUNTER — Telehealth: Payer: Self-pay | Admitting: Internal Medicine

## 2014-10-25 DIAGNOSIS — I480 Paroxysmal atrial fibrillation: Secondary | ICD-10-CM | POA: Diagnosis not present

## 2014-10-25 DIAGNOSIS — Z7901 Long term (current) use of anticoagulants: Secondary | ICD-10-CM

## 2014-10-25 LAB — POCT INR: INR: 2.1

## 2014-10-25 MED ORDER — ALBUTEROL SULFATE HFA 108 (90 BASE) MCG/ACT IN AERS: 2.0000 | INHALATION_SPRAY | Freq: Four times a day (QID) | RESPIRATORY_TRACT | Status: DC | PRN

## 2014-10-25 MED ORDER — ALBUTEROL SULFATE (2.5 MG/3ML) 0.083% IN NEBU: 2.5000 mg | INHALATION_SOLUTION | Freq: Four times a day (QID) | RESPIRATORY_TRACT | Status: DC | PRN

## 2014-10-25 MED ORDER — IRBESARTAN-HYDROCHLOROTHIAZIDE 150-12.5 MG PO TABS: 1.0000 | ORAL_TABLET | Freq: Every day | ORAL | Status: DC

## 2014-10-25 MED ORDER — FLUTICASONE-SALMETEROL 250-50 MCG/DOSE IN AEPB: 1.0000 | INHALATION_SPRAY | Freq: Two times a day (BID) | RESPIRATORY_TRACT | Status: DC

## 2014-10-25 NOTE — Telephone Encounter (Signed)
Switched to irbesartan hctz 150/12.5, will check BP at next INR visit

## 2014-10-25 NOTE — Telephone Encounter (Signed)
Pt has upcoming appointment in June with [facility]. Rx's have been sent in. Pt is aware. Nothing further was needed.

## 2014-10-27 NOTE — Telephone Encounter (Signed)
Yes

## 2014-10-29 ENCOUNTER — Encounter: Payer: Self-pay | Admitting: Internal Medicine

## 2014-10-29 ENCOUNTER — Ambulatory Visit (HOSPITAL_BASED_OUTPATIENT_CLINIC_OR_DEPARTMENT_OTHER): Payer: Medicare Other | Admitting: Internal Medicine

## 2014-10-29 ENCOUNTER — Telehealth: Payer: Self-pay | Admitting: Internal Medicine

## 2014-10-29 VITALS — BP 129/82 | HR 76 | Temp 98.7°F | Resp 18 | Ht 62.0 in | Wt 297.4 lb

## 2014-10-29 DIAGNOSIS — D649 Anemia, unspecified: Secondary | ICD-10-CM

## 2014-10-29 DIAGNOSIS — D509 Iron deficiency anemia, unspecified: Secondary | ICD-10-CM

## 2014-10-29 NOTE — Progress Notes (Signed)
[facility] [phone]   [phone]  OFFICE PROGRESS NOTE  [name], MD [address] [place] [address]  DIAGNOSIS: Unspecified anemia questionable for anemia of chronic disease/iron deficiency .   PRIOR THERAPY: None   CURRENT THERAPY: Over-the-counter oral iron tablets. 1 tablets daily 2-3 times a week.  INTERVAL HISTORY: Michele [name] 64 y.o. female returns to the clinic today for six-month followup visit. The patient is feeling fine today with no specific complaints except for mild fatigue. She has evaluation of her thyroid in the past by Dr. [name] and it was unremarkable. She has been off the oral iron tablets for the last few months. She denied having any bleeding, bruises or ecchymosis. The patient denied having any significant chest pain, shortness of breath, cough or hemoptysis. She denied having any significant weight loss or night sweats. She had repeat CBC and iron study performed recently and she is here for evaluation and discussion of her lab results.   MEDICAL HISTORY: Past Medical History  Diagnosis Date  . Allergic rhinitis   . Asthma   . CAD (coronary artery disease)   . Hypertension   . Sleep apnea   . Diabetes mellitus type II   . Depression   . PTSD (post-traumatic stress disorder)   . 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 Carpal tunnel syndrome, right   . Hyperlipidemia   . Paroxysmal a-fib   . CAD (coronary artery disease) 01/30/2007    stents trivial coronary artery disease diffusely, with a recent deployment od a intracoronary artery stent, 3.5 x 12 mm driver stent with no more than 20-30% in- stents restenosis.done by Dr [name] with re-look on novenber 10 2008 revealing a widely patent stent with otherwise trival CAD and normal LV function  . Chest pain 07/17/12009    2 D Echo EF >55%  . Obesity     ALLERGIES:  is allergic to meprobamate.  MEDICATIONS:  Current Outpatient  Prescriptions  Medication Sig Dispense Refill  . albuterol (PROVENTIL) (2.5 MG/3ML) 0.083% nebulizer solution Take 3 mLs (2.5 mg total) by nebulization every 6 (six) hours as needed. 360 mL 2  . albuterol (VENTOLIN HFA) 108 (90 BASE) MCG/ACT inhaler Inhale 2 puffs into the lungs every 6 (six) hours as needed for wheezing or shortness of breath. 18 g 2  . ARIPiprazole (ABILIFY) 15 MG tablet Take 1 tablet (15 mg total) by mouth daily. 90 tablet 0  . aspirin 81 MG tablet Take 81 mg by mouth daily.      . benztropine (COGENTIN) 0.5 MG tablet Take 1 tablet (0.5 mg total) by mouth at bedtime. 90 tablet 0  . diltiazem (CARDIZEM CD) 240 MG 24 hr capsule Take 1 capsule (240 mg total) by mouth daily. 90 capsule 2  . fexofenadine (ALLEGRA) 60 MG tablet Take 1 tablet (60 mg total) by mouth daily. Take 60 mg by mouth daily. 30 tablet 11  . Fluticasone-Salmeterol (ADVAIR DISKUS) 250-50 MCG/DOSE AEPB Inhale 1 puff into the lungs 2 (two) times daily. 1 each 2  . furosemide (LASIX) 40 MG tablet TAKE ONE (1) TABLET BY MOUTH EVERY DAY 30 tablet 9  . HYDROcodone-acetaminophen (NORCO) 10-325 MG per tablet Take 1 tablet by mouth every 6 (six) hours as needed.  0  . irbesartan-hydrochlorothiazide (AVALIDE) 150-12.5 MG per tablet Take 1 tablet by mouth daily. 30 tablet 5  . isosorbide mononitrate (IMDUR) 30 MG 24 hr  tablet TAKE ONE (1) TABLET BY MOUTH EVERY DAY 90 tablet 2  . metFORMIN (GLUCOPHAGE-XR) 500 MG 24 hr tablet TAKE (1) TABLET BY MOUTH EVERY DAY WITH BREAKFAST. 90 tablet 0  . metoprolol (LOPRESSOR) 100 MG tablet TAKE ONE (1) TABLET BY MOUTH TWO (2) TIMES DAILY 180 tablet 3  . mometasone (NASONEX) 50 MCG/ACT nasal spray Place 2 sprays into the nose daily. 17 g 2  . morphine (MS CONTIN) 15 MG 12 hr tablet Take 1 tablet by mouth at bedtime.  0  . morphine (MSIR) 15 MG tablet Take 1 by mouth at bedtime      . nitroGLYCERIN (NITROSTAT) 0.4 MG SL tablet Place 1 tablet (0.4 mg total) under the tongue every 5 (five)  minutes as needed. 25 tablet 3  . pantoprazole (PROTONIX) 20 MG tablet Take 20 mg by mouth daily.    . potassium chloride (KLOR-CON) 20 MEQ packet Take 1 by mouth daily     . rosuvastatin (CRESTOR) 10 MG tablet TAKE ONE (1) TABLET BY MOUTH EVERY OTHER DAY 90 tablet 0  . sertraline (ZOLOFT) 100 MG tablet Take 2 by mouth once daily 180 tablet 0  . Trospium Chloride 60 MG CP24 TAKE ONE CAPSULE BY MOUTH DAILY 90 each 1  . warfarin (COUMADIN) 7.5 MG tablet TAKE ONE AND ONE-HALF (1 1/2) TO TWO (2)TABLETS BY MOUTH DAILY AS DIRECTED 165 tablet 1  . [DISCONTINUED] SANCTURA XR 60 MG CP24 Take 1 capsule by mouth daily.     No current facility-administered medications for this visit.    SURGICAL HISTORY:  Past Surgical History  Procedure Laterality Date  . Cholecystectomy    . Left groin cyst    . Kidney stones    . Stress myocardial perfusion study  09/08/2010    normal; EF 75%  . Multilevel lumbar degenerative disc disease    . Hemorrhoid surgery    . Gallbladder surgery      REVIEW OF SYSTEMS:  A comprehensive review of systems was negative except for: Constitutional: positive for fatigue   PHYSICAL EXAMINATION: General appearance: alert, cooperative, fatigued and no distress Head: Normocephalic, without obvious abnormality, atraumatic Neck: no adenopathy, no JVD, supple, symmetrical, trachea midline and thyroid not enlarged, symmetric, no tenderness/mass/nodules Lymph nodes: Cervical, supraclavicular, and axillary nodes normal. Resp: clear to auscultation bilaterally Back: symmetric, no curvature. ROM normal. No CVA tenderness. Cardio: regular rate and rhythm, S1, S2 normal, no murmur, click, rub or gallop GI: soft, non-tender; bowel sounds normal; no masses,  no organomegaly Extremities: extremities normal, atraumatic, no cyanosis or edema  ECOG PERFORMANCE STATUS: 1 - Symptomatic but completely ambulatory  Blood pressure 129/82, pulse 76, temperature 98.7 F (37.1 C), temperature  source Oral, resp. rate 18, height 5\' 2"  (1.575 m), weight 297 lb 6.4 oz (134.9 kg), SpO2 98 %.  LABORATORY DATA: Lab Results  Component Value Date   WBC 6.4 10/22/2014   HGB 11.6 10/22/2014   HCT 37.7 10/22/2014   MCV 69.2* 10/22/2014   PLT 275 10/22/2014      Chemistry      Component Value Date/Time   NA 142 01/08/2014 1204   NA 144 10/02/2012 0905   K 4.2 01/08/2014 1204   K 3.4* 10/02/2012 0905   CL 101 01/08/2014 1204   CL 101 10/02/2012 0905   CO2 31 01/08/2014 1204   CO2 31* 10/02/2012 0905   BUN 12 01/08/2014 1204   BUN 13.9 10/02/2012 0905   CREATININE 0.71 01/08/2014 1204   CREATININE  0.8 10/02/2012 0905   CREATININE 0.76 01/07/2012 1458      Component Value Date/Time   CALCIUM 9.7 01/08/2014 1204   CALCIUM 9.9 10/02/2012 0905   ALKPHOS 89 08/01/2014 1218   AST 13 08/01/2014 1218   ALT 12 08/01/2014 1218   BILITOT 0.5 08/01/2014 1218     Other lab results: Ferritin 215, serum iron 51, TIBC 259 and iron saturation 17%.  RADIOGRAPHIC STUDIES: No results found.  ASSESSMENT AND PLAN: This is a very pleasant 64 years old [place] American female with history of persistent anemia questionable for anemia of chronic disease plus/minus iron deficiency. Her hemoglobin and hematocrit as well as the study are stable compared to 6 months ago. I recommended for her to resume taking the over-the-counter oral tablet 1tablet daily few times a week. She would come back for followup visit in 6 months with repeat CBC and iron study. She was advised to call immediately if she has any concerning symptoms in the interval.  The patient voices understanding of current disease status and treatment options and is in agreement with the current care plan.  All questions were answered. The patient knows to call the clinic with any problems, questions or concerns. We can certainly see the patient much sooner if necessary.  Disclaimer: This note was dictated with voice recognition  software. Similar sounding words can inadvertently be transcribed and may not be corrected upon review.

## 2014-10-29 NOTE — Telephone Encounter (Signed)
gave and printed appt sched and avs fo rpt for OCT.

## 2014-10-31 ENCOUNTER — Ambulatory Visit (INDEPENDENT_AMBULATORY_CARE_PROVIDER_SITE_OTHER): Payer: Medicare Other

## 2014-10-31 DIAGNOSIS — J309 Allergic rhinitis, unspecified: Secondary | ICD-10-CM

## 2014-11-05 ENCOUNTER — Ambulatory Visit (INDEPENDENT_AMBULATORY_CARE_PROVIDER_SITE_OTHER): Payer: Medicare Other | Admitting: Emergency Medicine

## 2014-11-05 ENCOUNTER — Encounter: Payer: Self-pay | Admitting: Emergency Medicine

## 2014-11-05 VITALS — BP 124/75 | HR 97 | Temp 98.3°F | Resp 18 | Ht 62.5 in | Wt 297.0 lb

## 2014-11-05 DIAGNOSIS — E119 Type 2 diabetes mellitus without complications: Secondary | ICD-10-CM

## 2014-11-05 DIAGNOSIS — Z23 Encounter for immunization: Secondary | ICD-10-CM

## 2014-11-05 DIAGNOSIS — M25432 Effusion, left wrist: Secondary | ICD-10-CM

## 2014-11-05 LAB — POCT GLYCOSYLATED HEMOGLOBIN (HGB A1C): Hemoglobin A1C: 6.2

## 2014-11-05 LAB — GLUCOSE, POCT (MANUAL RESULT ENTRY): POC Glucose: 134 mg/dl — AB (ref 70–99)

## 2014-11-05 MED ORDER — ZOSTER VACCINE LIVE 19400 UNT/0.65ML ~~LOC~~ SOLR: 0.6500 mL | Freq: Once | SUBCUTANEOUS | Status: DC

## 2014-11-05 NOTE — Progress Notes (Addendum)
Subjective:  This chart was scribed for [name], MD by Tamsen Roers, at [facility].  This patient was seen in room 23 and the patient's care was started at 10:37 AM.    Patient ID: Michele [name], female    DOB: 05-01-1951, 64 y.o.   MRN: [id number]  HPI  HPI Comments: Michele [name] is a 64 y.o. female who presents to the [facility] complaining of intermittent abdominal pain. She has had her gall bladder removed and is up to date with her colonoscopy.  She notes that her bowel movements are regular until she gets upset and gets irritable bowel syndrome. She saw her back doctor 6 weeks ago but denies getting any injections. She has very little swelling in her feet but is concerned about the swelling in her arms.     Immunizations:  She is up to date with her pneumonia vaccine but she has not yet had the Prevnar shot or her shingles vaccination.  She is up to date with her tetanus shot.  She has been trying to watch her diet and takes Metformin for her diabetes.   Depression: She last saw her psychiatrist a week ago and states that her depression has been doing better.  She has been trying to stay active and uses resistance bands, weights and stretches.         Patient Active Problem List   Diagnosis Date Noted  . Hyperlipidemia 07/17/2013  . Chest pain on exertion 12/14/2012  . Long term (current) use of anticoagulants 10/03/2012  . Major depressive disorder, recurrent episode, moderate 09/02/2011  . Menopausal and perimenopausal disorder 05/27/2011  . Depression 05/27/2011  . Urethrocele(618.03) 05/27/2011  . Anemia, iron deficiency 05/27/2011  . Paroxysmal a-fib 05/27/2011  . Vitamin D deficiency 05/27/2011  . PTSD (post-traumatic stress disorder) 05/27/2011  . Abnormal brain MRI 05/27/2011  . PAD (peripheral artery disease) 05/27/2011  . PFO (patent foramen ovale) 05/27/2011  . Diabetes mellitus type II   . DM  12/16/2009  . 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 SLEEP APNEA, OBSTRUCTIVE 05/10/2007  . Essential hypertension 05/10/2007  . Coronary atherosclerosis 05/10/2007  . Seasonal and perennial allergic rhinitis 05/10/2007  . 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 REFLUX, ESOPHAGEAL 05/10/2007   Past Medical History  Diagnosis Date  . Allergic rhinitis   . Asthma   . CAD (coronary artery disease)   . Hypertension   . Sleep apnea   . Diabetes mellitus type II   . Depression   . PTSD (post-traumatic stress disorder)   . 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 Carpal tunnel syndrome, right   . Hyperlipidemia   . Paroxysmal a-fib   . CAD (coronary artery disease) 01/30/2007    stents trivial coronary artery disease diffusely, with a recent deployment od a intracoronary artery stent, 3.5 x 12 mm driver stent with no more than 20-30% in- stents restenosis.done by Dr [name] with re-look on novenber 10 2008 revealing a widely patent stent with otherwise trival CAD and normal LV function  . Chest pain 07/17/12009    2 D Echo EF >55%  . Obesity    Past Surgical History  Procedure Laterality Date  . Cholecystectomy    . Left groin cyst    . Kidney stones    . Stress myocardial perfusion study  09/08/2010    normal; EF 75%  . Multilevel lumbar degenerative disc disease    .  Hemorrhoid surgery    . Gallbladder surgery     Allergies  Allergen Reactions  . Meprobamate Nausea And Vomiting   Prior to Admission medications   Medication Sig Start Date End Date Taking? Authorizing Provider  albuterol (PROVENTIL) (2.5 MG/3ML) 0.083% nebulizer solution Take 3 mLs (2.5 mg total) by nebulization every 6 (six) hours as needed. 10/25/14 04/20/17 Yes Clinton D Young, MD  albuterol (VENTOLIN HFA) 108 (90 BASE) MCG/ACT inhaler Inhale 2 puffs into the lungs every 6 (six) hours as needed for wheezing or shortness of breath. 10/25/14  Yes Deneise Lever, MD  ARIPiprazole (ABILIFY) 15 MG tablet Take  1 tablet (15 mg total) by mouth daily. 10/24/14  Yes Kathlee Nations, MD  aspirin 81 MG tablet Take 81 mg by mouth daily.     Yes Historical Provider, MD  benztropine (COGENTIN) 0.5 MG tablet Take 1 tablet (0.5 mg total) by mouth at bedtime. 10/24/14  Yes Kathlee Nations, MD  diltiazem (CARDIZEM CD) 240 MG 24 hr capsule Take 1 capsule (240 mg total) by mouth daily. 11/06/13  Yes Lorretta Harp, MD  fexofenadine (ALLEGRA) 60 MG tablet Take 1 tablet (60 mg total) by mouth daily. Take 60 mg by mouth daily. 12/16/10  Yes Deneise Lever, MD  Fluticasone-Salmeterol (ADVAIR DISKUS) 250-50 MCG/DOSE AEPB Inhale 1 puff into the lungs 2 (two) times daily. 10/25/14  Yes Deneise Lever, MD  furosemide (LASIX) 40 MG tablet TAKE ONE (1) TABLET BY MOUTH EVERY DAY 08/15/14  Yes Lorretta Harp, MD  HYDROcodone-acetaminophen [facility]) 10-325 MG per tablet Take 1 tablet by mouth every 6 (six) hours as needed. 09/19/14  Yes Historical Provider, MD  irbesartan-hydrochlorothiazide (AVALIDE) 150-12.5 MG per tablet Take 1 tablet by mouth daily. 10/25/14  Yes Lorretta Harp, MD  isosorbide mononitrate (IMDUR) 30 MG 24 hr tablet TAKE ONE (1) TABLET BY MOUTH EVERY DAY 09/18/13  Yes Lorretta Harp, MD  metFORMIN (GLUCOPHAGE-XR) 500 MG 24 hr tablet TAKE (1) TABLET BY MOUTH EVERY DAY WITH BREAKFAST. 06/09/14  Yes Mancel Bale, PA-C  metoprolol (LOPRESSOR) 100 MG tablet TAKE ONE (1) TABLET BY MOUTH TWO (2) TIMES DAILY 10/10/14  Yes Lorretta Harp, MD  mometasone (NASONEX) 50 MCG/ACT nasal spray Place 2 sprays into the nose daily. 10/23/12  Yes Deneise Lever, MD  morphine (MS CONTIN) 15 MG 12 hr tablet Take 1 tablet by mouth at bedtime. 09/19/14  Yes Historical Provider, MD  nitroGLYCERIN (NITROSTAT) 0.4 MG SL tablet Place 1 tablet (0.4 mg total) under the tongue every 5 (five) minutes as needed. 12/14/12  Yes Brett Canales, PA-C  pantoprazole (PROTONIX) 20 MG tablet Take 20 mg by mouth daily.   Yes Historical Provider, MD  potassium chloride  (KLOR-CON) 20 MEQ packet Take 1 by mouth daily    Yes Historical Provider, MD  rosuvastatin (CRESTOR) 10 MG tablet TAKE ONE (1) TABLET BY MOUTH EVERY OTHER DAY 08/16/14  Yes Mancel Bale, PA-C  sertraline (ZOLOFT) 100 MG tablet Take 2 by mouth once daily 10/24/14  Yes Kathlee Nations, MD  Trospium Chloride 60 MG CP24 TAKE ONE CAPSULE BY MOUTH DAILY 11/06/13  Yes Collene Leyden, PA-C  warfarin (COUMADIN) 7.5 MG tablet TAKE ONE AND ONE-HALF (1 1/2) TO TWO (2)TABLETS BY MOUTH DAILY AS DIRECTED 05/14/14  Yes Rockne Menghini, RPH-CPP   History   Social History  . Marital Status: Divorced    Spouse Name: N/A  . Number of Children:  2  . Years of Education: N/A   Occupational History  . NURSING [employer]    Social History Main Topics  . Smoking status: Never Smoker   . Smokeless tobacco: Never Used  . Alcohol Use: No  . Drug Use: No  . Sexual Activity: Not on file   Other Topics Concern  . Not on file   Social History Narrative      Review of Systems  Constitutional: Negative for fever and chills.  HENT: Negative for congestion.   Gastrointestinal: Positive for abdominal pain. Negative for nausea and vomiting.       Objective:   Physical Exam  CONSTITUTIONAL: She is comfortable seated in her char with her cane HEAD: Normocephalic/atraumatic EYES: EOMI/PERRL ENMT: Mucous membranes moist NECK: supple no meningeal signs SPINE/BACK:entire spine nontender CV: S1/S2 noted, no murmurs/rubs/gallops noted LUNGS: Lungs are clear to auscultation bilaterally, no apparent distress ABDOMEN: soft, nontender, no rebound or guarding, bowel sounds noted throughout abdomen GU:no cva tenderness NEURO: Pt is awake/alert/appropriate, moves all extremitiesx4.  No facial droop.   EXTREMITIES: pulses normal/equal, full ROM, 3 by 3 inch lipoma left distal wrist.  SKIN: warm, color normal PSYCH: no abnormalities of mood noted, alert and oriented to situation Results for orders placed or performed in  visit on 11/05/14  POCT glucose (manual entry)  Result Value Ref Range   POC Glucose 134 (A) 70 - 99 mg/dl  POCT glycosylated hemoglobin (Hb A1C)  Result Value Ref Range   Hemoglobin A1C 6.2     BP 124/75 mmHg  Pulse 97  Temp(Src) 98.3 F (36.8 C)  Resp 18  Ht 5' 2.5" (1.588 m)  Wt 297 lb (134.718 kg)  BMI 53.42 kg/m2  SpO2 93%       Assessment & Plan:  Diabetes is under good control. Continue metformin.I personally performed the services described in this documentation, which was scribed in my presence. The recorded information has been reviewed and is accurate. She will be given Prevnar today. She has had the pneumococcal vaccine. She was given a prescription for shingles vaccine but not to get it filled for a couple of months. We'll recheck in 3-4 months update her tetanus at that time.

## 2014-11-07 ENCOUNTER — Ambulatory Visit (INDEPENDENT_AMBULATORY_CARE_PROVIDER_SITE_OTHER): Payer: No Typology Code available for payment source | Admitting: Psychology

## 2014-11-07 ENCOUNTER — Ambulatory Visit (INDEPENDENT_AMBULATORY_CARE_PROVIDER_SITE_OTHER): Payer: Medicare Other

## 2014-11-07 DIAGNOSIS — F331 Major depressive disorder, recurrent, moderate: Secondary | ICD-10-CM

## 2014-11-07 DIAGNOSIS — J309 Allergic rhinitis, unspecified: Secondary | ICD-10-CM
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
## 2014-11-11 ENCOUNTER — Ambulatory Visit: Payer: Self-pay

## 2014-11-11 ENCOUNTER — Encounter: Payer: Self-pay | Admitting: Internal Medicine

## 2014-11-14 ENCOUNTER — Ambulatory Visit: Payer: Self-pay

## 2014-11-21 ENCOUNTER — Ambulatory Visit (INDEPENDENT_AMBULATORY_CARE_PROVIDER_SITE_OTHER): Payer: Medicare Other

## 2014-11-21 DIAGNOSIS — J309 Allergic rhinitis, unspecified: Secondary | ICD-10-CM | POA: Diagnosis not present

## 2014-11-22 ENCOUNTER — Ambulatory Visit (INDEPENDENT_AMBULATORY_CARE_PROVIDER_SITE_OTHER): Payer: Medicare Other | Admitting: Pharmacist Clinician (PhC)/ Clinical Pharmacy Specialist

## 2014-11-22 DIAGNOSIS — Z7901 Long term (current) use of anticoagulants: Secondary | ICD-10-CM

## 2014-11-22 DIAGNOSIS — I480 Paroxysmal atrial fibrillation: Secondary | ICD-10-CM | POA: Diagnosis not present

## 2014-11-22 LAB — POCT INR: INR: 3

## 2014-11-28 ENCOUNTER — Ambulatory Visit (INDEPENDENT_AMBULATORY_CARE_PROVIDER_SITE_OTHER): Payer: Medicare Other

## 2014-11-28 DIAGNOSIS — J309 Allergic rhinitis, unspecified: Secondary | ICD-10-CM | POA: Diagnosis not present

## 2014-12-05 ENCOUNTER — Ambulatory Visit (INDEPENDENT_AMBULATORY_CARE_PROVIDER_SITE_OTHER): Payer: No Typology Code available for payment source | Admitting: Psychology

## 2014-12-05 ENCOUNTER — Ambulatory Visit (INDEPENDENT_AMBULATORY_CARE_PROVIDER_SITE_OTHER): Payer: Medicare Other

## 2014-12-05 DIAGNOSIS — F4310 Post-traumatic stress disorder, unspecified: Secondary | ICD-10-CM

## 2014-12-05 DIAGNOSIS — F331 Major depressive disorder, recurrent, moderate: Secondary | ICD-10-CM

## 2014-12-05 DIAGNOSIS — J309 Allergic rhinitis, unspecified: Secondary | ICD-10-CM | POA: Diagnosis not present

## 2014-12-05 NOTE — Progress Notes (Signed)
   THERAPIST PROGRESS NOTE  Session Time: 11am-11.48am  Participation Level: Active  Behavioral Response: Well GroomedAlert, AFFECT WNL  Type of Therapy: Individual Therapy  Treatment Goals addressed: Diagnosis: MDD, PTSD and goal 1.  Interventions: CBT and Supportive  Summary: Michele [name] is a 64 y.o. female who presents with affect WNL.  Pt reported that she is focused on being grateful during this month and balancing loneliness she feels during this month.  Pt is able to have good insight that this is related to 3 deceased brothers birthday's this month. Pt however discussed positives that also occur w/ other family birthdays this month and enjoyment in fellowship w/ 2 sisters that live in the area.  Pt reported her pain has been managed ok recently. Pt reports that she is has to acknowledge and be realistic w/ goals set for exercise and weight loss. Pt reports she has lost about 10 lbs and sisters good support.  She wants to look at exercise once pays down couple of bills and aware 3 times a week is setting her expectations to high.    Suicidal/Homicidal: Nowithout intent/plan  Therapist Response: Assessed pt current functioning per pt report. Processed w/pt her mood and reflected pt good insight and awareness to be mindful of using her coping skills at this time.  Discussed her goals for self and being able to set realistic expectations.    Plan: Return again in 2 weeks.  Diagnosis: MDD, PTSD    [name], [place] 12/05/2014

## 2014-12-08 ENCOUNTER — Ambulatory Visit (INDEPENDENT_AMBULATORY_CARE_PROVIDER_SITE_OTHER): Payer: Medicare Other

## 2014-12-08 ENCOUNTER — Ambulatory Visit (INDEPENDENT_AMBULATORY_CARE_PROVIDER_SITE_OTHER): Payer: Medicare Other | Admitting: Family Medicine

## 2014-12-08 VITALS — BP 124/88 | HR 85 | Temp 98.8°F | Resp 16 | Ht 62.5 in | Wt 292.0 lb

## 2014-12-08 DIAGNOSIS — I480 Paroxysmal atrial fibrillation: Secondary | ICD-10-CM | POA: Diagnosis not present

## 2014-12-08 DIAGNOSIS — R197 Diarrhea, unspecified: Secondary | ICD-10-CM

## 2014-12-08 DIAGNOSIS — R05 Cough: Secondary | ICD-10-CM

## 2014-12-08 DIAGNOSIS — J0100 Acute maxillary sinusitis, unspecified: Secondary | ICD-10-CM

## 2014-12-08 DIAGNOSIS — R1110 Vomiting, unspecified: Secondary | ICD-10-CM | POA: Diagnosis not present

## 2014-12-08 DIAGNOSIS — R0789 Other chest pain: Secondary | ICD-10-CM | POA: Diagnosis not present

## 2014-12-08 DIAGNOSIS — R509 Fever, unspecified: Secondary | ICD-10-CM | POA: Diagnosis not present

## 2014-12-08 DIAGNOSIS — Z7901 Long term (current) use of anticoagulants: Secondary | ICD-10-CM | POA: Diagnosis not present

## 2014-12-08 DIAGNOSIS — R059 Cough, unspecified: Secondary | ICD-10-CM

## 2014-12-08 DIAGNOSIS — I2510 Atherosclerotic heart disease of native coronary artery without angina pectoris: Secondary | ICD-10-CM | POA: Diagnosis not present

## 2014-12-08 DIAGNOSIS — E119 Type 2 diabetes mellitus without complications: Secondary | ICD-10-CM | POA: Diagnosis not present

## 2014-12-08 LAB — POCT UA - MICROSCOPIC ONLY
Casts, Ur, LPF, POC: NEGATIVE
Crystals, Ur, HPF, POC: NEGATIVE
YEAST UA: NEGATIVE

## 2014-12-08 LAB — POCT CBC
Granulocyte percent: 74.3 %G (ref 37–80)
HCT, POC: 36.7 % — AB (ref 37.7–47.9)
Hemoglobin: 11 g/dL — AB (ref 12.2–16.2)
Lymph, poc: 1.6 (ref 0.6–3.4)
MCH, POC: 20.3 pg — AB (ref 27–31.2)
MCHC: 30.1 g/dL — AB (ref 31.8–35.4)
MCV: 67.4 fL — AB (ref 80–97)
MID (cbc): 0.6 (ref 0–0.9)
MPV: 7.4 fL (ref 0–99.8)
POC Granulocyte: 6.2 (ref 2–6.9)
POC LYMPH PERCENT: 18.8 %L (ref 10–50)
POC MID %: 6.9 %M (ref 0–12)
Platelet Count, POC: 388 10*3/uL (ref 142–424)
RBC: 5.44 M/uL (ref 4.04–5.48)
RDW, POC: 17.2 %
WBC: 8.3 10*3/uL (ref 4.6–10.2)

## 2014-12-08 LAB — POCT URINALYSIS DIPSTICK
Bilirubin, UA: NEGATIVE
GLUCOSE UA: NEGATIVE
Leukocytes, UA: NEGATIVE
Nitrite, UA: NEGATIVE
PROTEIN UA: 100
Spec Grav, UA: >=1.03
Urobilinogen, UA: 0.2
pH, UA: 5.5

## 2014-12-08 LAB — COMPREHENSIVE METABOLIC PANEL
ALBUMIN: 4.5 g/dL (ref 3.5–5.2)
ALT: 19 U/L (ref 0–35)
AST: 18 U/L (ref 0–37)
Alkaline Phosphatase: 93 U/L (ref 39–117)
BILIRUBIN TOTAL: 0.6 mg/dL (ref 0.2–1.2)
BUN: 15 mg/dL (ref 6–23)
CALCIUM: 9.9 mg/dL (ref 8.4–10.5)
CHLORIDE: 93 meq/L — AB (ref 96–112)
CO2: 30 mEq/L (ref 19–32)
Creat: 0.77 mg/dL (ref 0.50–1.10)
Glucose, Bld: 139 mg/dL — ABNORMAL HIGH (ref 70–99)
POTASSIUM: 3.4 meq/L — AB (ref 3.5–5.3)
SODIUM: 137 meq/L (ref 135–145)
TOTAL PROTEIN: 8.1 g/dL (ref 6.0–8.3)

## 2014-12-08 LAB — POCT INFLUENZA A/B
Influenza A, POC: NEGATIVE
Influenza B, POC: NEGATIVE

## 2014-12-08 LAB — AMYLASE: Amylase: 32 U/L (ref 0–105)

## 2014-12-08 LAB — GLUCOSE, POCT (MANUAL RESULT ENTRY): POC Glucose: 146 mg/dl — AB (ref 70–99)

## 2014-12-08 LAB — LIPASE: LIPASE: 21 U/L (ref 0–75)

## 2014-12-08 MED ORDER — MUCINEX DM 30-600 MG PO TB12: 1.0000 | ORAL_TABLET | Freq: Two times a day (BID) | ORAL | Status: DC | PRN

## 2014-12-08 MED ORDER — ONDANSETRON 8 MG PO TBDP: 8.0000 mg | ORAL_TABLET | Freq: Three times a day (TID) | ORAL | Status: DC | PRN

## 2014-12-08 MED ORDER — ONDANSETRON 4 MG PO TBDP
8.0000 mg | ORAL_TABLET | Freq: Once | ORAL | Status: AC
  Administered 2014-12-08: 8 mg via ORAL

## 2014-12-08 MED ORDER — AMOXICILLIN-POT CLAVULANATE 875-125 MG PO TABS: 1.0000 | ORAL_TABLET | Freq: Two times a day (BID) | ORAL | Status: DC

## 2014-12-08 NOTE — Progress Notes (Addendum)
Subjective:  This chart was scribed for Michele Forts, MD by Leandra Kern, Medical Scribe. This patient was seen in Room 11 and the patient's care was started at 11:35 AM.   Patient ID: Michele [name], female    DOB: [birth year], 64 y.o.   MRN: [id number]  12/08/2014  Emesis; Diarrhea; and Chest Pain   HPI  HPI Comments: [name] is a 64 y.o. female with a PMHx of A-fib, who presents to [facility] complaining of nausea and vomiting.  Patient reports that she has been having symptoms of  intermittent diarrhea about 10X times a day for 2 weeks now, with varying amounts of stool, accompanied with chills and diaphoresis. She also endorses having associated symptoms of chest pain, headaches, tinnitus, fever, sore throat, rhinorrhea (green and yellow discharge), congestion, postnasal drip, cough, SOB baseline though, upper abdominal pain, and dizziness. Patient states that she has not been able to keep any liquid down. Today patient has a fever, Tmax 102 this morning. Patient notes that last night she had multiple vomiting episodes, not present before. She denies hematochezia, or leg edema. Patient notes that she has a PNA shot 2 weeks ago, and it left a knot in her left arm. Patient reports that she wears a brief now.  She did not travele recently, and she reports that no family member is with the same symptoms at her house.    Review of Systems  Constitutional: Positive for fever, chills and diaphoresis.  HENT: Positive for congestion, postnasal drip, rhinorrhea, sore throat and tinnitus.   Respiratory: Positive for cough and shortness of breath.   Cardiovascular: Positive for chest pain. Negative for leg swelling.  Gastrointestinal: Positive for nausea, vomiting, abdominal pain and diarrhea. Negative for blood in stool.  Neurological: Positive for dizziness and headaches.    Past Medical History  Diagnosis Date  . Allergic rhinitis   . Asthma   . CAD  (coronary artery disease)   . Hypertension   . Sleep apnea   . Diabetes mellitus type II   . Depression   . PTSD (post-traumatic stress disorder)   . 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 Carpal tunnel syndrome, right   . Hyperlipidemia   . Paroxysmal a-fib   . CAD (coronary artery disease) 01/30/2007    stents trivial coronary artery disease diffusely, with a recent deployment od a intracoronary artery stent, 3.5 x 12 mm driver stent with no more than 20-30% in- stents restenosis.done by Dr [name] with re-look on novenber 10 2008 revealing a widely patent stent with otherwise trival CAD and normal LV function  . Chest pain 07/17/12009    2 D Echo EF >55%  . Obesity    Past Surgical History  Procedure Laterality Date  . Cholecystectomy    . Left groin cyst    . Kidney stones    . Stress myocardial perfusion study  09/08/2010    normal; EF 75%  . Multilevel lumbar degenerative disc disease    . Hemorrhoid surgery    . Gallbladder surgery     Allergies  Allergen Reactions  . Meprobamate Nausea And Vomiting   Current Outpatient Prescriptions  Medication Sig Dispense Refill  . albuterol (PROVENTIL) (2.5 MG/3ML) 0.083% nebulizer solution Take 3 mLs (2.5 mg total) by nebulization every 6 (six) hours as needed. 360 mL 2  . albuterol (VENTOLIN HFA) 108 (90 BASE) MCG/ACT inhaler Inhale 2 puffs into the lungs every  6 (six) hours as needed for wheezing or shortness of breath. 18 g 2  . ARIPiprazole (ABILIFY) 15 MG tablet Take 1 tablet (15 mg total) by mouth daily. 90 tablet 0  . aspirin 81 MG tablet Take 81 mg by mouth daily.      . benztropine (COGENTIN) 0.5 MG tablet Take 1 tablet (0.5 mg total) by mouth at bedtime. 90 tablet 0  . diltiazem (CARDIZEM CD) 240 MG 24 hr capsule Take 1 capsule (240 mg total) by mouth daily. 90 capsule 2  . fexofenadine (ALLEGRA) 60 MG tablet Take 1 tablet (60 mg total) by mouth daily. Take 60 mg by mouth daily. 30 tablet 11  .  Fluticasone-Salmeterol (ADVAIR DISKUS) 250-50 MCG/DOSE AEPB Inhale 1 puff into the lungs 2 (two) times daily. 1 each 2  . furosemide (LASIX) 40 MG tablet TAKE ONE (1) TABLET BY MOUTH EVERY DAY 30 tablet 9  . HYDROcodone-acetaminophen (NORCO) 10-325 MG per tablet Take 1 tablet by mouth every 6 (six) hours as needed.  0  . irbesartan-hydrochlorothiazide (AVALIDE) 150-12.5 MG per tablet Take 1 tablet by mouth daily. 30 tablet 5  . isosorbide mononitrate (IMDUR) 30 MG 24 hr tablet TAKE ONE (1) TABLET BY MOUTH EVERY DAY 90 tablet 2  . metFORMIN (GLUCOPHAGE-XR) 500 MG 24 hr tablet TAKE (1) TABLET BY MOUTH EVERY DAY WITH BREAKFAST. 90 tablet 0  . metoprolol (LOPRESSOR) 100 MG tablet TAKE ONE (1) TABLET BY MOUTH TWO (2) TIMES DAILY 180 tablet 3  . mometasone (NASONEX) 50 MCG/ACT nasal spray Place 2 sprays into the nose daily. 17 g 2  . morphine (MS CONTIN) 15 MG 12 hr tablet Take 1 tablet by mouth at bedtime.  0  . nitroGLYCERIN (NITROSTAT) 0.4 MG SL tablet Place 1 tablet (0.4 mg total) under the tongue every 5 (five) minutes as needed. 25 tablet 3  . pantoprazole (PROTONIX) 20 MG tablet Take 20 mg by mouth daily.    . potassium chloride (KLOR-CON) 20 MEQ packet Take 1 by mouth daily     . rosuvastatin (CRESTOR) 10 MG tablet TAKE ONE (1) TABLET BY MOUTH EVERY OTHER DAY 90 tablet 0  . sertraline (ZOLOFT) 100 MG tablet Take 2 by mouth once daily 180 tablet 0  . Trospium Chloride 60 MG CP24 TAKE ONE CAPSULE BY MOUTH DAILY 90 each 1  . warfarin (COUMADIN) 7.5 MG tablet TAKE ONE AND ONE-HALF (1 1/2) TO TWO (2)TABLETS BY MOUTH DAILY AS DIRECTED 165 tablet 1  . zoster vaccine live, PF, (ZOSTAVAX) 30160 UNT/0.65ML injection Inject 19,400 Units into the skin once. 1 each 0  . amoxicillin-clavulanate (AUGMENTIN) 875-125 MG per tablet Take 1 tablet by mouth 2 (two) times daily. 20 tablet 0  . Dextromethorphan-Guaifenesin (MUCINEX DM) 30-600 MG TB12 Take 1 tablet by mouth 2 (two) times daily as needed. 30 each 0  .  ondansetron (ZOFRAN-ODT) 8 MG disintegrating tablet Take 1 tablet (8 mg total) by mouth every 8 (eight) hours as needed for nausea. 21 tablet 0  . [DISCONTINUED] SANCTURA XR 60 MG CP24 Take 1 capsule by mouth daily.     No current facility-administered medications for this visit.       Objective:    BP 124/88 mmHg  Pulse 85  Temp(Src) 98.8 F (37.1 C)  Resp 16  Ht 5' 2.5" (1.588 m)  Wt 292 lb (132.45 kg)  BMI 52.52 kg/m2  SpO2 93% Physical Exam  Constitutional: She is oriented to person, place, and time. She appears well-developed and  well-nourished. No distress.  HENT:  Head: Normocephalic and atraumatic.  Right Ear: External ear normal.  Left Ear: External ear normal.  Mouth/Throat: Oropharynx is clear and moist. No oropharyngeal exudate.  maxillary sinus tenderness.     Eyes: Pupils are equal, round, and reactive to light.  Neck: Normal range of motion. Neck supple.  Cardiovascular: Normal rate, regular rhythm and normal heart sounds.  Exam reveals no gallop and no friction rub.   No murmur heard. normal sinus rhythm now  Pulmonary/Chest: Effort normal and breath sounds normal. She has no wheezes. She has no rales. She exhibits no tenderness.  Abdominal: Soft. She exhibits no distension. There is tenderness. There is no rebound and no guarding.  epigastric tenderness   Musculoskeletal: Normal range of motion. She exhibits no edema (no lower extremity edema) or tenderness.  Neurological: She is alert and oriented to person, place, and time. No cranial nerve deficit.  Skin: Skin is warm and dry. No rash noted.  Psychiatric: She has a normal mood and affect. Her behavior is normal.  Nursing note and vitals reviewed.  Results for orders placed or performed in visit on 12/08/14  POCT CBC  Result Value Ref Range   WBC 8.3 4.6 - 10.2 K/uL   Lymph, poc 1.6 0.6 - 3.4   POC LYMPH PERCENT 18.8 10 - 50 %L   MID (cbc) 0.6 0 - 0.9   POC MID % 6.9 0 - 12 %M   POC Granulocyte 6.2  2 - 6.9   Granulocyte percent 74.3 37 - 80 %G   RBC 5.44 4.04 - 5.48 M/uL   Hemoglobin 11.0 (A) 12.2 - 16.2 g/dL   HCT, POC 36.7 (A) 37.7 - 47.9 %   MCV 67.4 (A) 80 - 97 fL   MCH, POC 20.3 (A) 27 - 31.2 pg   MCHC 30.1 (A) 31.8 - 35.4 g/dL   RDW, POC 17.2 %   Platelet Count, POC 388 142 - 424 K/uL   MPV 7.4 0 - 99.8 fL  POCT glucose (manual entry)  Result Value Ref Range   POC Glucose 146 (A) 70 - 99 mg/dl  POCT Influenza A/B  Result Value Ref Range   Influenza A, POC Negative    Influenza B, POC Negative   POCT urinalysis dipstick  Result Value Ref Range   Color, UA yellow    Clarity, UA cloudy    Glucose, UA neg    Bilirubin, UA neg    Ketones, UA trace    Spec Grav, UA >=1.030    Blood, UA small    pH, UA 5.5    Protein, UA 100    Urobilinogen, UA 0.2    Nitrite, UA neg    Leukocytes, UA Negative   POCT UA - Microscopic Only  Result Value Ref Range   WBC, Ur, HPF, POC 0-2    RBC, urine, microscopic 0-4    Bacteria, U Microscopic trace    Mucus, UA trace    Epithelial cells, urine per micros 0-4    Crystals, Ur, HPF, POC neg    Casts, Ur, LPF, POC neg    Yeast, UA neg     [facility] reading (PRIMARY) by  Dr. Tamala Julian. CXR: NAD  EKG: NSR; non-specific ST changes; no change.  ZOFRAN 8MG  ODT ADMINISTERED IN OFFICE.     Assessment & Plan:   1. Fever and chills   2. Vomiting and diarrhea   3. Cough   4. Other chest pain  5. Type 2 diabetes mellitus without complication   6. Paroxysmal a-fib   7. Long term current use of anticoagulant therapy   8. Atherosclerosis of native coronary artery of native heart without angina pectoris   9. Acute maxillary sinusitis, recurrence not specified    1.  Vomiting and diarrhea: New. Benign abdominal exam; obtain labs; s/p Zofran in office; rx for Zofran provided; BRAT diet; hydration. RTC for inability to keep down liquids.  Orthostatics stable; does not warrant hydration at this time. 2.  Acute maxillary sinusitis: New. Rx for  Augmentin, Mucinex DM provided.   3.  DMII: controlled; monitor sugars closely with acute infection. 4.  CAD: stable EKG; chest pain related to recent cough and vomiting. 5.  Atrial fibrillation: stable; recent INR at goal.  In NSR currently. 6.  Chronic coumadin therapy: recent INR at goal; managed per cardiology.     Meds ordered this encounter  Medications  . DISCONTD: amoxicillin-clavulanate (AUGMENTIN) 875-125 MG per tablet    Sig: Take 1 tablet by mouth 2 (two) times daily.    Dispense:  20 tablet    Refill:  0  . DISCONTD: Dextromethorphan-Guaifenesin (MUCINEX DM) 30-600 MG TB12    Sig: Take 1 tablet by mouth 2 (two) times daily as needed.    Dispense:  30 each    Refill:  0  . DISCONTD: ondansetron (ZOFRAN-ODT) 8 MG disintegrating tablet    Sig: Take 1 tablet (8 mg total) by mouth every 8 (eight) hours as needed for nausea.    Dispense:  21 tablet    Refill:  0  . ondansetron (ZOFRAN-ODT) disintegrating tablet 8 mg    Sig:   . ondansetron (ZOFRAN-ODT) 8 MG disintegrating tablet    Sig: Take 1 tablet (8 mg total) by mouth every 8 (eight) hours as needed for nausea.    Dispense:  21 tablet    Refill:  0  . Dextromethorphan-Guaifenesin (MUCINEX DM) 30-600 MG TB12    Sig: Take 1 tablet by mouth 2 (two) times daily as needed.    Dispense:  30 each    Refill:  0  . amoxicillin-clavulanate (AUGMENTIN) 875-125 MG per tablet    Sig: Take 1 tablet by mouth 2 (two) times daily.    Dispense:  20 tablet    Refill:  0    No Follow-up on file.   I personally performed the services described in this documentation, which was scribed in my presence. The recorded information has been reviewed and considered.  Nasser Ku Elayne Guerin, M.D. [facility] [address] [place], [place]  [address] [phone] phone [phone] fax

## 2014-12-08 NOTE — Patient Instructions (Signed)

## 2014-12-12 ENCOUNTER — Ambulatory Visit (INDEPENDENT_AMBULATORY_CARE_PROVIDER_SITE_OTHER): Payer: Medicare Other

## 2014-12-12 DIAGNOSIS — J309 Allergic rhinitis, unspecified: Secondary | ICD-10-CM

## 2014-12-19 ENCOUNTER — Ambulatory Visit (INDEPENDENT_AMBULATORY_CARE_PROVIDER_SITE_OTHER): Payer: Medicare Other

## 2014-12-19 ENCOUNTER — Ambulatory Visit (INDEPENDENT_AMBULATORY_CARE_PROVIDER_SITE_OTHER): Payer: No Typology Code available for payment source | Admitting: Psychology

## 2014-12-19 DIAGNOSIS — J309 Allergic rhinitis, unspecified: Secondary | ICD-10-CM | POA: Diagnosis not present

## 2014-12-19 DIAGNOSIS — F4310 Post-traumatic stress disorder, unspecified: Secondary | ICD-10-CM

## 2014-12-19 DIAGNOSIS — F331 Major depressive disorder, recurrent, moderate: Secondary | ICD-10-CM | POA: Diagnosis not present

## 2014-12-19 NOTE — Progress Notes (Signed)
   THERAPIST PROGRESS NOTE  Session Time: 10.55am-11.48am  Participation Level: Active  Behavioral Response: Well GroomedAlert, affect slight depressed  Type of Therapy: Individual Therapy  Treatment Goals addressed: Diagnosis: MDD, PTSD and goal 1.  Interventions: CBT and Supportive  Summary: SHAWNA WEARING is a 64 y.o. female who presents with report of tired, slow going this morning- struggling w/ some pain. Pt reported that she did work out w/ bands as shown by [employer] therapist in past and sore today. Pt reported that she recently had bad cold and now recovering.  Pt reported that she is thankful for today and seeing grandchildren grow.  Pt does express that she is getting old- struggling to recall as easily- but reframed for self to a positive managing.  Pt discussed her siblings coming to visit for nephews graduation and very excited to see them. Pt discussed how she still struggles w/ loneliness and validated and normalized her own feelings. Pt was focused on how she copes through this and able to identify many connections that she continues w/ today.  Pt discussed how wants more connectedness w/ local event/activities but struggles to feel safe going into these interactions. Pt aware of her hx impact on these and finding safety in exploring and further relationship not avoiding.    Suicidal/Homicidal: Nowithout intent/plan  Therapist Response: Assessed pt current functioning per pt report. Processed w/pt her mood and assisted pt in recognizing strengths and making positive reframes.  Discussed pt connectedness and what lacks is rich local support system outside of sister's.  Explored w/pt balancing exploring to further connections while planning for her safety given past.   Plan: Return again in 2 weeks.  Diagnosis:  Major Depression, Recurrent  and Post Traumatic Stress Disorder      [name],[name], LPC 12/19/2014

## 2014-12-20 ENCOUNTER — Ambulatory Visit (INDEPENDENT_AMBULATORY_CARE_PROVIDER_SITE_OTHER): Payer: Medicare Other | Admitting: Pharmacist Clinician (PhC)/ Clinical Pharmacy Specialist

## 2014-12-20 DIAGNOSIS — I480 Paroxysmal atrial fibrillation: Secondary | ICD-10-CM | POA: Diagnosis not present

## 2014-12-20 DIAGNOSIS — Z7901 Long term (current) use of anticoagulants: Secondary | ICD-10-CM

## 2014-12-20 LAB — POCT INR: INR: 4.8

## 2014-12-26 ENCOUNTER — Ambulatory Visit: Payer: Self-pay

## 2015-01-02 ENCOUNTER — Ambulatory Visit (HOSPITAL_COMMUNITY): Payer: Self-pay | Admitting: Psychology

## 2015-01-02 ENCOUNTER — Ambulatory Visit (INDEPENDENT_AMBULATORY_CARE_PROVIDER_SITE_OTHER): Payer: Medicare Other

## 2015-01-02 ENCOUNTER — Telehealth (HOSPITAL_COMMUNITY): Payer: Self-pay | Admitting: Psychology

## 2015-01-02 DIAGNOSIS — J309 Allergic rhinitis, unspecified: Secondary | ICD-10-CM | POA: Diagnosis not present

## 2015-01-02 NOTE — Telephone Encounter (Signed)
Called and left message for pt that she missed her 11am appointment today.  Inquired if she was ok or if just mixed up appointment time.  Asked to call back to f/u w/ office.

## 2015-01-06 ENCOUNTER — Ambulatory Visit (INDEPENDENT_AMBULATORY_CARE_PROVIDER_SITE_OTHER): Payer: Medicare Other | Admitting: Internal Medicine

## 2015-01-06 ENCOUNTER — Ambulatory Visit (INDEPENDENT_AMBULATORY_CARE_PROVIDER_SITE_OTHER)
Admission: RE | Admit: 2015-01-06 | Discharge: 2015-01-06 | Disposition: A | Discharge status: Home / Self Care | Payer: Medicare Other | Source: Ambulatory Visit | Attending: Internal Medicine | Admitting: Internal Medicine

## 2015-01-06 ENCOUNTER — Encounter: Payer: Self-pay | Admitting: Internal Medicine

## 2015-01-06 VITALS — BP 122/80 | HR 84 | Ht 62.0 in | Wt 291.2 lb

## 2015-01-06 DIAGNOSIS — J209 Acute bronchitis, unspecified: Secondary | ICD-10-CM

## 2015-01-06 DIAGNOSIS — J309 Allergic rhinitis, unspecified: Secondary | ICD-10-CM | POA: Diagnosis not present

## 2015-01-06 DIAGNOSIS — J3089 Other allergic rhinitis: Secondary | ICD-10-CM

## 2015-01-06 DIAGNOSIS — J302 Other seasonal allergic rhinitis: Secondary | ICD-10-CM

## 2015-01-06 DIAGNOSIS — J45998 Other asthma: Secondary | ICD-10-CM | POA: Diagnosis not present

## 2015-01-06 DIAGNOSIS — G4733 Obstructive sleep apnea (adult) (pediatric): Secondary | ICD-10-CM | POA: Diagnosis not present

## 2015-01-06 MED ORDER — DOXYCYCLINE HYCLATE 100 MG PO TABS: ORAL_TABLET | ORAL | Status: DC
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
## 2015-01-06 NOTE — Patient Instructions (Signed)
Script sent for doxycycline antibiotic  This antibiotic will interact with your warfarin control. Be sure to tell them you are starting it when you go to get your coumadin checked tomorrow so they can advise you about changing your dose of warfarin.  Order CXR- dx acute bronchitis  You can see a dermatologist about the changes in your skin. I think it is "keratosis pilaris" They may suggest a skin softening lotion.   We can continue allergy vaccine 1:10 GH  We have stopped CPAP for now, since you weren't able to wear it.

## 2015-01-07 ENCOUNTER — Ambulatory Visit (INDEPENDENT_AMBULATORY_CARE_PROVIDER_SITE_OTHER): Payer: Medicare Other | Admitting: Pharmacist

## 2015-01-07 ENCOUNTER — Other Ambulatory Visit: Payer: Self-pay | Admitting: Internal Medicine

## 2015-01-07 ENCOUNTER — Other Ambulatory Visit: Payer: Self-pay | Admitting: Physician Assistant

## 2015-01-07 DIAGNOSIS — Z7901 Long term (current) use of anticoagulants: Secondary | ICD-10-CM

## 2015-01-07 DIAGNOSIS — I480 Paroxysmal atrial fibrillation: Secondary | ICD-10-CM

## 2015-01-07 LAB — POCT INR: INR: 2.5

## 2015-01-09 ENCOUNTER — Ambulatory Visit (INDEPENDENT_AMBULATORY_CARE_PROVIDER_SITE_OTHER): Payer: Medicare Other

## 2015-01-09 DIAGNOSIS — J309 Allergic rhinitis, unspecified: Secondary | ICD-10-CM | POA: Diagnosis not present

## 2015-01-13 ENCOUNTER — Telehealth: Payer: Self-pay | Admitting: Internal Medicine

## 2015-01-13 NOTE — Telephone Encounter (Signed)
Date Mixed: 01/13/2015 Vial: AB Strength: 1:10 Here/Mail/Pick Up: Here Mixed By: Desmond Dike, CMA

## 2015-01-14 ENCOUNTER — Ambulatory Visit (INDEPENDENT_AMBULATORY_CARE_PROVIDER_SITE_OTHER): Payer: Medicare Other

## 2015-01-14 DIAGNOSIS — J309 Allergic rhinitis, unspecified: Secondary | ICD-10-CM | POA: Diagnosis not present

## 2015-01-16 ENCOUNTER — Ambulatory Visit (INDEPENDENT_AMBULATORY_CARE_PROVIDER_SITE_OTHER): Payer: No Typology Code available for payment source | Admitting: Psychology

## 2015-01-16 ENCOUNTER — Ambulatory Visit (INDEPENDENT_AMBULATORY_CARE_PROVIDER_SITE_OTHER): Payer: Medicare Other

## 2015-01-16 DIAGNOSIS — F331 Major depressive disorder, recurrent, moderate: Secondary | ICD-10-CM | POA: Diagnosis not present

## 2015-01-16 DIAGNOSIS — J309 Allergic rhinitis, unspecified: Secondary | ICD-10-CM

## 2015-01-16 NOTE — Progress Notes (Signed)
   THERAPIST PROGRESS NOTE  Session Time: 11.02am-11.55am  Participation Level: Active  Behavioral Response: Well GroomedAlertDepressed  Type of Therapy: Individual Therapy  Treatment Goals addressed: Diagnosis: MDD and goal 1.  Interventions: CBT and Supportive  Summary: Michele [name] is a 64 y.o. female who presents with depressed affect.  Pt reports she is trying to get over a cold and is on her second round of antibiotics.  Pt reported that over past couple weeks struggling w/ depressed mood and thoughts of tired being sick, tired being in pain, tired of aging body and her struggle to loss weight. Pt aware of thought patterns that leading to increased depressed mood.  Pt was able to acknowledge that can't control her aging and effect having- but focus on her self care and positive body cognition and seek support to meet her goals.  Pt reported that her daughter is coming w/ her to PCP to discuss concerns at her next meeting.  Pt acknowledged her feeling lonely for familiarity of past relationships and environment.  Pt acknowledged her connection w/ family and how these are friendships for pt.   Suicidal/Homicidal: Nowithout intent/plan  Therapist Response: Assessed pt current functioning per pt.  Processed w/pt increased depressed mood and validated pt frustrations w/ aging process and assisted pt in reframing negative thoughts patters.  Focused pt on positive body cognition thinking and strategies.  Reflected pt strong connections and encouraged continued contact w/ friends from home- not just at reunions.   Plan: Return again in 2 weeks.  Diagnosis: MDD, PTSD    Kinston Magnan, [place] 01/16/2015

## 2015-01-23 ENCOUNTER — Ambulatory Visit (INDEPENDENT_AMBULATORY_CARE_PROVIDER_SITE_OTHER): Payer: Medicare Other

## 2015-01-23 DIAGNOSIS — J309 Allergic rhinitis, unspecified: Secondary | ICD-10-CM | POA: Diagnosis not present

## 2015-01-27 ENCOUNTER — Ambulatory Visit (HOSPITAL_COMMUNITY): Payer: Self-pay | Admitting: Psychiatry

## 2015-01-28 ENCOUNTER — Encounter: Payer: Self-pay | Admitting: Internal Medicine

## 2015-01-30 ENCOUNTER — Ambulatory Visit (INDEPENDENT_AMBULATORY_CARE_PROVIDER_SITE_OTHER): Payer: Medicare Other | Admitting: Pharmacist Clinician (PhC)/ Clinical Pharmacy Specialist

## 2015-01-30 ENCOUNTER — Ambulatory Visit (INDEPENDENT_AMBULATORY_CARE_PROVIDER_SITE_OTHER): Payer: Medicare Other

## 2015-01-30 DIAGNOSIS — J309 Allergic rhinitis, unspecified: Secondary | ICD-10-CM | POA: Diagnosis not present

## 2015-01-30 DIAGNOSIS — I480 Paroxysmal atrial fibrillation: Secondary | ICD-10-CM | POA: Diagnosis not present

## 2015-01-30 DIAGNOSIS — Z7901 Long term (current) use of anticoagulants: Secondary | ICD-10-CM

## 2015-01-30 LAB — POCT INR: INR: 3.2

## 2015-01-30 NOTE — Assessment & Plan Note (Signed)
She has been motivated to continue allergy vaccine indicates that it is helpful

## 2015-01-30 NOTE — Assessment & Plan Note (Signed)
We had tried various accommodations for to make CPAP easier for her. She does not want to try it again and is not interested in an oral appliance. Weight loss recommended.

## 2015-01-30 NOTE — Assessment & Plan Note (Signed)
Residual very mild wheeze and cough after bronchitis 2 weeks ago. Plan-chest x-ray, doxycycline

## 2015-01-31 ENCOUNTER — Ambulatory Visit (INDEPENDENT_AMBULATORY_CARE_PROVIDER_SITE_OTHER): Payer: No Typology Code available for payment source | Admitting: Psychology

## 2015-01-31 DIAGNOSIS — F4310 Post-traumatic stress disorder, unspecified: Secondary | ICD-10-CM | POA: Diagnosis not present

## 2015-01-31 DIAGNOSIS — F331 Major depressive disorder, recurrent, moderate: Secondary | ICD-10-CM

## 2015-02-03 ENCOUNTER — Other Ambulatory Visit (HOSPITAL_COMMUNITY): Payer: Self-pay | Admitting: Psychiatry

## 2015-02-03 ENCOUNTER — Other Ambulatory Visit: Payer: Self-pay | Admitting: Physician Assistant

## 2015-02-03 ENCOUNTER — Other Ambulatory Visit: Payer: Self-pay | Admitting: Internal Medicine

## 2015-02-03 NOTE — Progress Notes (Signed)
   THERAPIST PROGRESS NOTE  Session Time: 11.04am-12pm  Participation Level: Active  Behavioral Response: Well GroomedAlertDepressed  Type of Therapy: Individual Therapy  Treatment Goals addressed: Diagnosis: MDD, PTSD and goal 1  Interventions: CBT and Supportive  Summary: ADRIONA KANEY is a 64 y.o. female who presents with depressed mood and affect.  Pt reported that today she is feeling better than has this past week- reporting that she her mood has been more depressed in past week.  Pt reported that she did experience increased pain yesterday and this is better managed today.  Pt is able to identify feeling want for connection w/ others and missing others.  Pt acknowledges that can't live in past and needs to focus on connections she has in the present.  Pt discussed her barriers to making and furthering friendships w/ other besides siblings and old friends not local. Pt discussed her earliest experience of first not trusting following her parents temporary separation when she was around 64y/o and acknowledged that this impacted her.     Suicidal/Homicidal: Nowithout intent/plan  Therapist Response: Assessed pt current functioning per pt report. Processed w/pt her depressed mood and explored pt loneliness despite very close relationships and support.  Encouraged pt to focus on relationships and supports that she is close to and reach out to these in times of loneliness and feeling disconnected.  Explored w/ pt barriers to trusting others and impact trauma has on this.   Plan: Return again in 2 weeks.  Diagnosis: MDD, PTSD    Tzivia Oneil, [place] 02/03/2015

## 2015-02-04 ENCOUNTER — Encounter (HOSPITAL_COMMUNITY): Payer: Self-pay | Admitting: Psychiatry

## 2015-02-04 ENCOUNTER — Ambulatory Visit (INDEPENDENT_AMBULATORY_CARE_PROVIDER_SITE_OTHER): Payer: No Typology Code available for payment source | Admitting: Psychiatry

## 2015-02-04 VITALS — BP 117/70 | HR 93 | Ht 62.0 in | Wt 290.8 lb

## 2015-02-04 DIAGNOSIS — F323 Major depressive disorder, single episode, severe with psychotic features: Secondary | ICD-10-CM | POA: Diagnosis not present

## 2015-02-04 DIAGNOSIS — F4310 Post-traumatic stress disorder, unspecified: Secondary | ICD-10-CM | POA: Diagnosis not present

## 2015-02-04 DIAGNOSIS — F331 Major depressive disorder, recurrent, moderate: Secondary | ICD-10-CM

## 2015-02-04 MED ORDER — BENZTROPINE MESYLATE 0.5 MG PO TABS: 0.5000 mg | ORAL_TABLET | Freq: Every day | ORAL | Status: DC

## 2015-02-04 MED ORDER — ARIPIPRAZOLE 15 MG PO TABS: 15.0000 mg | ORAL_TABLET | Freq: Every day | ORAL | Status: DC

## 2015-02-04 MED ORDER — SERTRALINE HCL 100 MG PO TABS: ORAL_TABLET | ORAL | Status: DC
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
## 2015-02-06 ENCOUNTER — Encounter: Payer: Self-pay | Admitting: Emergency Medicine

## 2015-02-06 ENCOUNTER — Ambulatory Visit (INDEPENDENT_AMBULATORY_CARE_PROVIDER_SITE_OTHER): Payer: Medicare Other | Admitting: Emergency Medicine

## 2015-02-06 ENCOUNTER — Ambulatory Visit (INDEPENDENT_AMBULATORY_CARE_PROVIDER_SITE_OTHER): Payer: Medicare Other

## 2015-02-06 VITALS — BP 137/86 | HR 74 | Temp 98.4°F | Resp 16 | Ht 62.0 in | Wt 282.0 lb

## 2015-02-06 DIAGNOSIS — E119 Type 2 diabetes mellitus without complications: Secondary | ICD-10-CM | POA: Diagnosis not present

## 2015-02-06 DIAGNOSIS — F329 Major depressive disorder, single episode, unspecified: Secondary | ICD-10-CM | POA: Diagnosis not present

## 2015-02-06 DIAGNOSIS — J309 Allergic rhinitis, unspecified: Secondary | ICD-10-CM | POA: Diagnosis not present

## 2015-02-06 DIAGNOSIS — M549 Dorsalgia, unspecified: Secondary | ICD-10-CM

## 2015-02-06 DIAGNOSIS — F32A Depression, unspecified: Secondary | ICD-10-CM

## 2015-02-06 LAB — POCT GLYCOSYLATED HEMOGLOBIN (HGB A1C): HEMOGLOBIN A1C: 7

## 2015-02-06 LAB — GLUCOSE, POCT (MANUAL RESULT ENTRY): POC GLUCOSE: 122 mg/dL — AB (ref 70–99)
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
## 2015-02-06 NOTE — Patient Instructions (Signed)
Decrease your pain medication to take a half tablet rather than a whole tablet on the schedule given to you by Dr. Nelva Bush. I have made you an appointment to be seen at the bariatric clinic.Marland Kitchen Recheck 3 months

## 2015-02-13 ENCOUNTER — Ambulatory Visit (INDEPENDENT_AMBULATORY_CARE_PROVIDER_SITE_OTHER): Payer: Medicare Other

## 2015-02-13 ENCOUNTER — Ambulatory Visit (INDEPENDENT_AMBULATORY_CARE_PROVIDER_SITE_OTHER): Payer: No Typology Code available for payment source | Admitting: Psychology

## 2015-02-13 DIAGNOSIS — F331 Major depressive disorder, recurrent, moderate: Secondary | ICD-10-CM

## 2015-02-13 DIAGNOSIS — J309 Allergic rhinitis, unspecified: Secondary | ICD-10-CM

## 2015-02-13 DIAGNOSIS — F4310 Post-traumatic stress disorder, unspecified: Secondary | ICD-10-CM

## 2015-02-13 NOTE — Progress Notes (Signed)
   THERAPIST PROGRESS NOTE  Session Time: 10.38am-11.39am  Participation Level: Active  Behavioral Response: Well GroomedAlertAFFECT Bright  Type of Therapy: Individual Therapy  Treatment Goals addressed: Diagnosis: PTSD, MDD and goal 1.  Interventions: CBT and Supportive  Summary: Michele [name] is a 64 y.o. female who presents with full and bright affect. Pt reported that she is looking forward to her birthday in a few days.  Pt reported that she is focused on gratefulness for another year, being a grandparent and support of others around her. Pt reported that feeling less depressed and she is "keeping her positive juices flowing".  Pt further discussed her growing up and father leaving and how this was first time feeling insecure and how impacting relationship when he came back.  Pt was able to recognize that she had others that she leaned on during that time and that still had security from siblings and extended family.  Pt is able to recognize how important this was for her growing up and that still plays a role today.  Pt was able to related how she plays this role as well to others and feels good about this.    Suicidal/Homicidal: Nowithout intent/plan  Therapist Response: Assessed pt current functioning per pt report.  Processed w/pt improvement in mood.  Explored w/pt early childhood experiences and how these impacted her believes of herself and life.  Assisted pt in recognizing the positive supports network that also present and "rewriting these beliefs" to acknowledge the positives as well that present in past and present today.    Plan: Return again in 2 weeks.  Diagnosis: PTSD and MDD    Joyann Spidle, [facility] 02/13/2015

## 2015-02-19 ENCOUNTER — Ambulatory Visit (INDEPENDENT_AMBULATORY_CARE_PROVIDER_SITE_OTHER): Payer: Medicare Other | Admitting: Pharmacist Clinician (PhC)/ Clinical Pharmacy Specialist

## 2015-02-19 DIAGNOSIS — Z7901 Long term (current) use of anticoagulants: Secondary | ICD-10-CM | POA: Diagnosis not present

## 2015-02-19 DIAGNOSIS — I480 Paroxysmal atrial fibrillation: Secondary | ICD-10-CM

## 2015-02-19 LAB — POCT INR: INR: 2.9

## 2015-02-20 ENCOUNTER — Ambulatory Visit (INDEPENDENT_AMBULATORY_CARE_PROVIDER_SITE_OTHER): Payer: Medicare Other

## 2015-02-20 ENCOUNTER — Telehealth: Payer: Self-pay | Admitting: Internal Medicine

## 2015-02-20 DIAGNOSIS — J309 Allergic rhinitis, unspecified: Secondary | ICD-10-CM

## 2015-02-20 NOTE — Telephone Encounter (Signed)
Called spoke with pt. She does not remember speaking with a nurse regarding her 6/20 CXR. i advised her of results again. Nothing further needed

## 2015-02-25 ENCOUNTER — Telehealth: Payer: Self-pay | Admitting: Internal Medicine

## 2015-02-25 NOTE — Telephone Encounter (Signed)
Patient says that her caller id shows that she received a call from our office.  She didn't know who called.   Did not see where anyone called patient, the only thing I could think of was if the phone tree called to remind her of her Allergy appointment on 8/11.  Patient reminded of Allergy appointment on 02/27/15. Nothing further needed.

## 2015-02-27 ENCOUNTER — Ambulatory Visit (INDEPENDENT_AMBULATORY_CARE_PROVIDER_SITE_OTHER): Payer: Medicare Other

## 2015-02-27 DIAGNOSIS — J309 Allergic rhinitis, unspecified: Secondary | ICD-10-CM | POA: Diagnosis not present

## 2015-03-06 ENCOUNTER — Ambulatory Visit (INDEPENDENT_AMBULATORY_CARE_PROVIDER_SITE_OTHER): Payer: Medicare Other

## 2015-03-06 ENCOUNTER — Ambulatory Visit (INDEPENDENT_AMBULATORY_CARE_PROVIDER_SITE_OTHER): Payer: No Typology Code available for payment source | Admitting: Psychology

## 2015-03-06 DIAGNOSIS — F4310 Post-traumatic stress disorder, unspecified: Secondary | ICD-10-CM

## 2015-03-06 DIAGNOSIS — F331 Major depressive disorder, recurrent, moderate: Secondary | ICD-10-CM

## 2015-03-06 DIAGNOSIS — J309 Allergic rhinitis, unspecified: Secondary | ICD-10-CM

## 2015-03-06 NOTE — Progress Notes (Signed)
   THERAPIST PROGRESS NOTE  Session Time: 11am-11:53pm  Participation Level: Active  Behavioral Response: Well GroomedAlertDepressed  Type of Therapy: Individual Therapy  Treatment Goals addressed: Diagnosis: MDD, PTSD and goal 1.  Interventions: CBT and Other: self compassion  Summary: Michele [name] is a 64 y.o. female who presents with report of increased pain and more difficulty managing pain. Pt reports she is going to see Dr. Nelva Bush to discuss treatments.  Pt reported that her daughter accompanied to her PCP to discuss possible bariatric surgery.  Pt reports this makes her nervous as last surgery struggled w/ anesethia.  Pt was able reframe that she trusts her providers would make recommendations based on her health and in her best care.  Pt also discussed feeling self pity at times that want for friendships and statements of negative self worth.  These statements also present in session re: her body.  Pt was able to participate in practice of positive self worth statements w/ use of breath, movement and body cognition.   Pt expressed that will work on this practice daily and felt good about.  Suicidal/Homicidal: Nowithout intent/plan  Therapist Response: Assessed pt current functioning per pt.  Processed w/pt pain and effect on mood.  Also reflected pt negative self statements and negative statements about her body.  Reiterated need for practices that will reinforce positive self worth and self compassion.  Led pt through chest opening movement w/ breath and pairing w/ statement of self compassion.  Processed w/pt how to incorporate for self daily.   Plan: Return again in 2 weeks.  Diagnosis: MDD, PTSD    [name],[name], [place] 03/06/2015

## 2015-03-12 ENCOUNTER — Encounter: Payer: Self-pay | Admitting: Internal Medicine

## 2015-03-13 ENCOUNTER — Ambulatory Visit (INDEPENDENT_AMBULATORY_CARE_PROVIDER_SITE_OTHER): Payer: Medicare Other

## 2015-03-13 DIAGNOSIS — J309 Allergic rhinitis, unspecified: Secondary | ICD-10-CM

## 2015-03-19 ENCOUNTER — Ambulatory Visit (INDEPENDENT_AMBULATORY_CARE_PROVIDER_SITE_OTHER): Payer: Medicare Other | Admitting: Pharmacist Clinician (PhC)/ Clinical Pharmacy Specialist

## 2015-03-19 DIAGNOSIS — Z7901 Long term (current) use of anticoagulants: Secondary | ICD-10-CM

## 2015-03-19 DIAGNOSIS — I480 Paroxysmal atrial fibrillation: Secondary | ICD-10-CM

## 2015-03-19 LAB — POCT INR: INR: 1.7

## 2015-03-20 ENCOUNTER — Ambulatory Visit (INDEPENDENT_AMBULATORY_CARE_PROVIDER_SITE_OTHER): Payer: Medicare Other

## 2015-03-20 DIAGNOSIS — J309 Allergic rhinitis, unspecified: Secondary | ICD-10-CM | POA: Diagnosis not present

## 2015-03-27 ENCOUNTER — Ambulatory Visit (INDEPENDENT_AMBULATORY_CARE_PROVIDER_SITE_OTHER): Payer: No Typology Code available for payment source | Admitting: Psychology

## 2015-03-27 ENCOUNTER — Ambulatory Visit (INDEPENDENT_AMBULATORY_CARE_PROVIDER_SITE_OTHER): Payer: Medicare Other

## 2015-03-27 DIAGNOSIS — J309 Allergic rhinitis, unspecified: Secondary | ICD-10-CM | POA: Diagnosis not present

## 2015-03-27 DIAGNOSIS — F4310 Post-traumatic stress disorder, unspecified: Secondary | ICD-10-CM | POA: Diagnosis not present

## 2015-03-27 DIAGNOSIS — F331 Major depressive disorder, recurrent, moderate: Secondary | ICD-10-CM
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
## 2015-04-02 ENCOUNTER — Other Ambulatory Visit: Payer: Self-pay | Admitting: Cardiovascular Disease

## 2015-04-03 ENCOUNTER — Ambulatory Visit (INDEPENDENT_AMBULATORY_CARE_PROVIDER_SITE_OTHER): Payer: Medicare Other

## 2015-04-03 DIAGNOSIS — J309 Allergic rhinitis, unspecified: Secondary | ICD-10-CM

## 2015-04-09 ENCOUNTER — Ambulatory Visit (INDEPENDENT_AMBULATORY_CARE_PROVIDER_SITE_OTHER): Payer: Medicare Other | Admitting: Pharmacist Clinician (PhC)/ Clinical Pharmacy Specialist

## 2015-04-09 DIAGNOSIS — I480 Paroxysmal atrial fibrillation: Secondary | ICD-10-CM | POA: Diagnosis not present

## 2015-04-09 DIAGNOSIS — Z7901 Long term (current) use of anticoagulants: Secondary | ICD-10-CM | POA: Diagnosis not present

## 2015-04-09 LAB — POCT INR: INR: 3

## 2015-04-10 ENCOUNTER — Ambulatory Visit (INDEPENDENT_AMBULATORY_CARE_PROVIDER_SITE_OTHER): Payer: Medicare Other

## 2015-04-10 DIAGNOSIS — J309 Allergic rhinitis, unspecified: Secondary | ICD-10-CM | POA: Diagnosis not present

## 2015-04-15 ENCOUNTER — Other Ambulatory Visit: Payer: Self-pay | Admitting: Emergency Medicine

## 2015-04-17 ENCOUNTER — Ambulatory Visit (INDEPENDENT_AMBULATORY_CARE_PROVIDER_SITE_OTHER): Payer: No Typology Code available for payment source | Admitting: Psychology

## 2015-04-17 ENCOUNTER — Ambulatory Visit (INDEPENDENT_AMBULATORY_CARE_PROVIDER_SITE_OTHER): Payer: Medicare Other

## 2015-04-17 DIAGNOSIS — Z23 Encounter for immunization: Secondary | ICD-10-CM

## 2015-04-17 DIAGNOSIS — F331 Major depressive disorder, recurrent, moderate: Secondary | ICD-10-CM | POA: Diagnosis not present

## 2015-04-17 DIAGNOSIS — F4310 Post-traumatic stress disorder, unspecified: Secondary | ICD-10-CM

## 2015-04-17 NOTE — Progress Notes (Signed)
   THERAPIST PROGRESS NOTE  Session Time: 1.45pm-2.30pm  Participation Level: Active  Behavioral Response: Well GroomedAlert, sad  Type of Therapy: Individual Therapy  Treatment Goals addressed: Diagnosis: MDD, PTSD and goal 1.  Interventions: CBT and Strength-based  Summary: Michele [name] is a 64 y.o. female who presents with report of feeling better w/ less pain and feeling more mobility. Pt reported that she does feel a little down today more so as her deceased brother's birthday is tomorrow. Pt identifies how she can have support of her sister that live local and celebrate his day still.  Pt reported that she did go to bariatric surgery info session and learned more about less invasive surgeries available. Pt reported that she feels good about the prospective of this surgery and has begun collecting the needed information to begin process.  Pt feels good that not letting fear get in the way.  Pt reported that is staying mindful of loving herself and acknowledges still struggles doing this w/ her self talk but is redirecting self more.  Suicidal/Homicidal: Nowithout intent/plan  Therapist Response: Assessed pt current functioning per pt report.  Processed w/pt her mood, pain and medical issues.  Explored w/pt feelings related to brother's birthday and how she can utilize traditions and support for coping.  Reiterated pt practices of self compassion.   Plan: Return again in 2 weeks.  Diagnosis: MDD, PTSD    [name],[name], [place] 04/17/2015

## 2015-04-22 ENCOUNTER — Encounter: Payer: Self-pay | Admitting: Emergency Medicine

## 2015-04-24 ENCOUNTER — Ambulatory Visit: Payer: Medicare Other

## 2015-04-24 ENCOUNTER — Ambulatory Visit (INDEPENDENT_AMBULATORY_CARE_PROVIDER_SITE_OTHER): Payer: Medicare Other

## 2015-04-24 DIAGNOSIS — J309 Allergic rhinitis, unspecified: Secondary | ICD-10-CM

## 2015-04-28 ENCOUNTER — Ambulatory Visit (INDEPENDENT_AMBULATORY_CARE_PROVIDER_SITE_OTHER): Payer: Medicare Other | Admitting: Emergency Medicine

## 2015-04-28 ENCOUNTER — Other Ambulatory Visit (HOSPITAL_BASED_OUTPATIENT_CLINIC_OR_DEPARTMENT_OTHER): Payer: Medicare Other

## 2015-04-28 ENCOUNTER — Ambulatory Visit
Admission: RE | Admit: 2015-04-28 | Discharge: 2015-04-28 | Disposition: A | Discharge status: Home / Self Care | Payer: Medicare Other | Source: Ambulatory Visit | Attending: Emergency Medicine | Admitting: Emergency Medicine

## 2015-04-28 VITALS — BP 130/78 | HR 71 | Temp 98.8°F | Resp 16 | Ht 62.0 in | Wt 291.0 lb

## 2015-04-28 DIAGNOSIS — D649 Anemia, unspecified: Secondary | ICD-10-CM

## 2015-04-28 DIAGNOSIS — R509 Fever, unspecified: Secondary | ICD-10-CM

## 2015-04-28 DIAGNOSIS — R29898 Other symptoms and signs involving the musculoskeletal system: Secondary | ICD-10-CM

## 2015-04-28 DIAGNOSIS — R930 Abnormal findings on diagnostic imaging of skull and head, not elsewhere classified: Secondary | ICD-10-CM

## 2015-04-28 DIAGNOSIS — D509 Iron deficiency anemia, unspecified: Secondary | ICD-10-CM

## 2015-04-28 DIAGNOSIS — R269 Unspecified abnormalities of gait and mobility: Secondary | ICD-10-CM

## 2015-04-28 LAB — POCT URINALYSIS DIP (MANUAL ENTRY)
BILIRUBIN UA: NEGATIVE
Glucose, UA: NEGATIVE
Leukocytes, UA: NEGATIVE
NITRITE UA: NEGATIVE
PH UA: 5.5
Protein Ur, POC: NEGATIVE
RBC UA: NEGATIVE
SPEC GRAV UA: 1.025
UROBILINOGEN UA: 0.2

## 2015-04-28 LAB — CBC WITH DIFFERENTIAL/PLATELET
BASO%: 0.7 % (ref 0.0–2.0)
Basophils Absolute: 0 10*3/uL (ref 0.0–0.1)
EOS%: 2.1 % (ref 0.0–7.0)
Eosinophils Absolute: 0.2 10*3/uL (ref 0.0–0.5)
HEMATOCRIT: 33.8 % — AB (ref 34.8–46.6)
HGB: 10.5 g/dL — ABNORMAL LOW (ref 11.6–15.9)
LYMPH#: 2.5 10*3/uL (ref 0.9–3.3)
LYMPH%: 36 % (ref 14.0–49.7)
MCH: 21 pg — AB (ref 25.1–34.0)
MCHC: 31.2 g/dL — ABNORMAL LOW (ref 31.5–36.0)
MCV: 67.4 fL — ABNORMAL LOW (ref 79.5–101.0)
MONO#: 0.5 10*3/uL (ref 0.1–0.9)
MONO%: 7.4 % (ref 0.0–14.0)
NEUT#: 3.8 10*3/uL (ref 1.5–6.5)
NEUT%: 53.8 % (ref 38.4–76.8)
Platelets: 323 10*3/uL (ref 145–400)
RBC: 5.02 10*6/uL (ref 3.70–5.45)
RDW: 17.7 % — ABNORMAL HIGH (ref 11.2–14.5)
WBC: 7 10*3/uL (ref 3.9–10.3)

## 2015-04-28 LAB — POC MICROSCOPIC URINALYSIS (UMFC)

## 2015-04-28 LAB — IRON AND TIBC CHCC
%SAT: 19 % — ABNORMAL LOW (ref 21–57)
IRON: 57 ug/dL (ref 41–142)
TIBC: 299 ug/dL (ref 236–444)
UIBC: 242 ug/dL (ref 120–384)

## 2015-04-28 LAB — GLUCOSE, POCT (MANUAL RESULT ENTRY): POC Glucose: 97 mg/dl (ref 70–99)

## 2015-04-28 LAB — FERRITIN CHCC: FERRITIN: 291 ng/mL — AB (ref 9–269)

## 2015-04-28 NOTE — Progress Notes (Signed)
Subjective:  This chart was scribed for  Nena Jordan MD, by Tamsen Roers, at [facility].  This patient was seen in room 13 and the patient's care was started at 11:25 AM.    Patient ID: Michele [name], female    DOB: 1950-08-24, 64 y.o.   MRN: [id number] Chief Complaint  Patient presents with  . Arm Pain    pt. has fell twice over the past 3 days.    HPI  HPI Comments: Michele [name] is a 64 y.o. female who presents to the [facility] complaining  falling twice over the past three days (Friday and Sunday).  She has symptoms of soreness in her legs and arms from falling.  She states that her back is also giving her pain and she is not able to stand for longer than 5 minutes at a time.  Patient states that she lost her balance when she was in the kitchen and once when she was in her living room.  She denies any syncope that caused her falls and states that it is due to her unsteadiness and a weaker right leg which has been going on for the past couple weeks.  She has her cane with her at all times when she is walking (which is for her knees).  She had her blood work done for Dr. Inda Merlin this morning. Patient will be seeing Dr. Theda Sers on the 19th and has an appointment scheduled here on the 13th (October).  Patient is currently living by herself and has someone to help her at home when she needs it.  Patient denies a headache today.     Patient Active Problem List   Diagnosis Date Noted  . Hyperlipidemia 07/17/2013  . Chest pain on exertion 12/14/2012  . Long term current use of anticoagulant therapy 10/03/2012  . Major depressive disorder, recurrent episode, moderate ([place]) 09/02/2011  . Menopausal and perimenopausal disorder 05/27/2011  . Depression 05/27/2011  . Urethrocele(618.03) 05/27/2011  . Anemia, iron deficiency 05/27/2011  . Paroxysmal a-fib ([place]) 05/27/2011  . Vitamin D deficiency 05/27/2011  . PTSD (post-traumatic stress  disorder) 05/27/2011  . Abnormal brain MRI 05/27/2011  . PAD (peripheral artery disease) ([place]) 05/27/2011  . PFO (patent foramen ovale) 05/27/2011  . Diabetes mellitus, type II ([place])   . DM 12/16/2009  . 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 Obstructive sleep apnea 05/10/2007  . Essential hypertension 05/10/2007  . Coronary atherosclerosis 05/10/2007  . Seasonal and perennial allergic rhinitis 05/10/2007  . 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 REFLUX, ESOPHAGEAL 05/10/2007   Past Medical History  Diagnosis Date  . Allergic rhinitis   . Asthma   . CAD (coronary artery disease)   . Hypertension   . Sleep apnea   . Diabetes mellitus type II   . Depression   . PTSD (post-traumatic stress disorder)   . 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 Carpal tunnel syndrome, right   . Hyperlipidemia   . Paroxysmal a-fib ([place])   . CAD (coronary artery disease) 01/30/2007    stents trivial coronary artery disease diffusely, with a recent deployment od a intracoronary artery stent, 3.5 x 12 mm driver stent with no more than 20-30% in- stents restenosis.done by Dr [name] with re-look on novenber 10 2008 revealing a widely patent stent with otherwise trival CAD and normal LV function  . Chest pain 07/17/12009    2 D Echo EF >55%  .  Obesity    Past Surgical History  Procedure Laterality Date  . Cholecystectomy    . Left groin cyst    . Kidney stones    . Stress myocardial perfusion study  09/08/2010    normal; EF 75%  . Multilevel lumbar degenerative disc disease    . Hemorrhoid surgery    . Gallbladder surgery     Allergies  Allergen Reactions  . Meprobamate Nausea And Vomiting   Prior to Admission medications   Medication Sig Start Date End Date Taking? Authorizing Provider  ADVAIR DISKUS 250-50 MCG/DOSE AEPB INHALE ONE PUFF TWICE DAILY 01/07/15  Yes Deneise Lever, MD  albuterol (PROVENTIL) (2.5 MG/3ML) 0.083% nebulizer solution Take 3 mLs (2.5 mg total) by nebulization  every 6 (six) hours as needed. 10/25/14 04/20/17 Yes Clinton D Young, MD  ARIPiprazole (ABILIFY) 15 MG tablet Take 1 tablet (15 mg total) by mouth daily. 02/04/15  Yes Kathlee Nations, MD  aspirin 81 MG tablet Take 81 mg by mouth daily.     Yes Historical Provider, MD  CRESTOR 10 MG tablet TAKE ONE (1) TABLET BY MOUTH EVERY OTHER DAY (NEED OFFICE VISIT FOR ADDITIONAL REFILLS) 04/16/15  Yes Mancel Bale, PA-C  fexofenadine (ALLEGRA) 60 MG tablet Take 1 tablet (60 mg total) by mouth daily. Take 60 mg by mouth daily. 12/16/10  Yes Deneise Lever, MD  furosemide (LASIX) 40 MG tablet TAKE ONE (1) TABLET BY MOUTH EVERY DAY 08/15/14  Yes Lorretta Harp, MD  HYDROcodone-acetaminophen Lifescape) 10-325 MG per tablet Take 1 tablet by mouth every 6 (six) hours as needed. 09/19/14  Yes Historical Provider, MD  irbesartan-hydrochlorothiazide (AVALIDE) 150-12.5 MG per tablet Take 1 tablet by mouth daily. 10/25/14  Yes Lorretta Harp, MD  isosorbide mononitrate (IMDUR) 30 MG 24 hr tablet TAKE ONE (1) TABLET BY MOUTH EVERY DAY 09/18/13  Yes Lorretta Harp, MD  metFORMIN (GLUCOPHAGE-XR) 500 MG 24 hr tablet TAKE ONE (1) TABLET BY MOUTH EVERY DAY WITH BREAKFAST (OV NEED FOR ADDITIONAL REFILLS) 04/16/15  Yes Mancel Bale, PA-C  metoprolol (LOPRESSOR) 100 MG tablet TAKE ONE (1) TABLET BY MOUTH TWO (2) TIMES DAILY 10/10/14  Yes Lorretta Harp, MD  mometasone (NASONEX) 50 MCG/ACT nasal spray Place 2 sprays into the nose daily. 10/23/12  Yes Deneise Lever, MD  nitroGLYCERIN (NITROSTAT) 0.4 MG SL tablet Place 1 tablet (0.4 mg total) under the tongue every 5 (five) minutes as needed. 12/14/12  Yes Brett Canales, PA-C  pantoprazole (PROTONIX) 20 MG tablet Take 20 mg by mouth daily.   Yes Historical Provider, MD  potassium chloride (KLOR-CON) 20 MEQ packet Take 1 by mouth daily    Yes Historical Provider, MD  sertraline (ZOLOFT) 100 MG tablet Take 2 by mouth once daily 02/04/15  Yes Kathlee Nations, MD  Trospium Chloride 60 MG CP24 TAKE ONE  CAPSULE BY MOUTH DAILY 11/06/13  Yes Heather M Marte, PA-C  VENTOLIN HFA 108 (90 BASE) MCG/ACT inhaler INHALE TWO PUFFS INTO THE LUNGS EVERY SIX HOURS AS NEEDED FOR WHEEZING OR SHORTNESS OF BREATHKEEP APPOINTMENT* 02/03/15  Yes Deneise Lever, MD  warfarin (COUMADIN) 7.5 MG tablet TAKE ONE AND ONE-HALF (1 1/2) TO TWO (2)TABLETS BY MOUTH DAILY AS DIRECTED 05/14/14  Yes Kristin L Alvstad, RPH-CPP  benztropine (COGENTIN) 0.5 MG tablet Take 1 tablet (0.5 mg total) by mouth at bedtime. 02/04/15   Kathlee Nations, MD  diltiazem (CARDIZEM CD) 240 MG 24 hr capsule TAKE ONE CAPSULE  BY MOUTH DAILY 04/02/15   Lorretta Harp, MD  morphine (MS CONTIN) 15 MG 12 hr tablet Take 1 tablet by mouth at bedtime. 09/19/14   Historical Provider, MD  NONFORMULARY OR COMPOUNDED ITEM Allergy Vaccine 1:10 Given at [facility]    Historical Provider, MD   Social History   Social History  . Marital Status: Divorced    Spouse Name: N/A  . Number of Children: 2  . Years of Education: N/A   Occupational History  . N[employer]SING [employer]    Social History Main Topics  . Smoking status: Never Smoker   . Smokeless tobacco: Never Used  . Alcohol Use: No  . Drug Use: No  . Sexual Activity: Not on file   Other Topics Concern  . Not on file   Social History Narrative        Review of Systems  Constitutional: Negative for fever and chills.  Eyes: Negative for pain and itching.  Respiratory: Negative for cough and choking.   Gastrointestinal: Negative for nausea and vomiting.  Musculoskeletal: Positive for myalgias and gait problem. Negative for neck pain and neck stiffness.  Neurological: Negative for syncope and headaches.       Objective:   Physical Exam CONSTITUTIONAL: She is morbidly obese  HEAD: Normocephalic/atraumatic EYES: EOMI/PERRL ENMT: Mucous membranes moist NECK: supple no meningeal signs LUNGS: Lungs are clear to auscultation bilaterally, no apparent distress NEURO:Cranial nerves are intact, no  facial asymmetry.  She walks with the assistance of a cane.  EXTREMITIES: There is mild weakness in the right leg.  SKIN: warm, color normal PSYCH: no abnormalities of mood noted, alert and oriented to situation     Filed Vitals:   04/28/15 1113  BP: 130/78  Pulse: 71  Temp: 99 F (37.2 C)  TempSrc: Oral  Resp: 16  Height: 5' 2"  (1.575 m)  Weight: 291 lb (131.997 kg)  SpO2: 95%   Results for orders placed or performed in visit on 04/28/15  CBC with Differential  Result Value Ref Range   WBC 7.0 3.9 - 10.3 10e3/uL   NEUT# 3.8 1.5 - 6.5 10e3/uL   HGB 10.5 (L) 11.6 - 15.9 g/dL   HCT 33.8 (L) 34.8 - 46.6 %   Platelets 323 145 - 400 10e3/uL   MCV 67.4 (L) 79.5 - 101.0 fL   MCH 21.0 (L) 25.1 - 34.0 pg   MCHC 31.2 (L) 31.5 - 36.0 g/dL   RBC 5.02 3.70 - 5.45 10e6/uL   RDW 17.7 (H) 11.2 - 14.5 %   lymph# 2.5 0.9 - 3.3 10e3/uL   MONO# 0.5 0.1 - 0.9 10e3/uL   Eosinophils Absolute 0.2 0.0 - 0.5 10e3/uL   Basophils Absolute 0.0 0.0 - 0.1 10e3/uL   NEUT% 53.8 38.4 - 76.8 %   LYMPH% 36.0 14.0 - 49.7 %   MONO% 7.4 0.0 - 14.0 %   EOS% 2.1 0.0 - 7.0 %   BASO% 0.7 0.0 - 2.0 %  Ferritin  Result Value Ref Range   Ferritin 291 (H) 9 - 269 ng/ml  Iron and TIBC [facility]  Result Value Ref Range   Iron 57 41 - 142 ug/dL   TIBC 299 236 - 444 ug/dL   UIBC 242 120 - 384 ug/dL   %SAT 19 (L) 21 - 57 %   *Note: Due to a large number of results and/or encounters for the requested time period, some results have not been displayed. A complete set of results can be found in  Results Review.   Results for orders placed or performed in visit on 04/28/15  POCT glucose (manual entry)  Result Value Ref Range   POC Glucose 97 70 - 99 mg/dl  POCT Microscopic Urinalysis ([facility])  Result Value Ref Range   WBC,UR,HPF,POC Few (A) None WBC/hpf   RBC,UR,HPF,POC None None RBC/hpf   Bacteria Few (A) None   Mucus Present (A) Absent   Epithelial Cells, UR Per Microscopy Moderate (A) None cells/hpf  POCT  urinalysis dipstick  Result Value Ref Range   Color, UA yellow yellow   Clarity, UA cloudy (A) clear   Glucose, UA negative negative   Bilirubin, UA negative negative   Ketones, POC UA trace (5) (A) negative   Spec Grav, UA 1.025    Blood, UA negative negative   pH, UA 5.5    Protein Ur, POC negative negative   Urobilinogen, UA 0.2    Nitrite, UA Negative Negative   Leukocytes, UA Negative Negative   *Note: Due to a large number of results and/or encounters for the requested time period, some results have not been displayed. A complete set of results can be found in Results Review.        Assessment & Plan:  Patient has a significant gait disorder.  She has arthritis in both knees and feels weak in her right knee.  She is high risk for a stroke.  She will have a CT scan done this afternoon, follow up is dependent on CT results.  CT returned showing chronic ischemic changes. Referral made for patient to see neurology for their help. I personally performed the services described in this documentation, which was scribed in my presence. The recorded information has been reviewed and is accurate.I personally performed the services described in this documentation, which was scribed in my presence. The recorded information has been reviewed and is accurate.

## 2015-04-28 NOTE — Patient Instructions (Addendum)
You have an appointment at [facility] located at [employer] today at 210

## 2015-04-29 LAB — URINE CULTURE: Colony Count: 50000

## 2015-05-01 ENCOUNTER — Ambulatory Visit: Payer: Self-pay

## 2015-05-01 ENCOUNTER — Ambulatory Visit: Payer: Medicare Other | Admitting: Emergency Medicine

## 2015-05-02 ENCOUNTER — Telehealth: Payer: Self-pay

## 2015-05-02 NOTE — Telephone Encounter (Signed)
PT 2 times a week for six weeks and  home health  Occ Therapy assessment -  [facility]  Evelina Dun   [phone]  Dr. [name]

## 2015-05-05 ENCOUNTER — Encounter: Payer: Self-pay | Admitting: Internal Medicine

## 2015-05-05 ENCOUNTER — Ambulatory Visit (HOSPITAL_BASED_OUTPATIENT_CLINIC_OR_DEPARTMENT_OTHER): Payer: Medicare Other

## 2015-05-05 ENCOUNTER — Ambulatory Visit (HOSPITAL_BASED_OUTPATIENT_CLINIC_OR_DEPARTMENT_OTHER): Payer: Medicare Other | Admitting: Internal Medicine

## 2015-05-05 ENCOUNTER — Telehealth: Payer: Self-pay | Admitting: Internal Medicine

## 2015-05-05 VITALS — BP 125/89 | HR 72 | Temp 98.3°F | Resp 18 | Ht 62.0 in | Wt 292.1 lb

## 2015-05-05 DIAGNOSIS — D509 Iron deficiency anemia, unspecified: Secondary | ICD-10-CM

## 2015-05-05 NOTE — Progress Notes (Signed)
[place] [phone]   [phone]  OFFICE PROGRESS NOTE  Michele Reichmann, MD [address] [name] [address]  DIAGNOSIS: Unspecified anemia questionable for anemia of chronic disease/iron deficiency .   PRIOR THERAPY: None   CURRENT THERAPY: Over-the-counter oral iron tablets. 1 tablets daily 2-3 times a week.  INTERVAL HISTORY: Michele [name] 64 y.o. female returns to the clinic today for six-month followup visit. The patient is feeling fine today with no specific complaints except for persistent fatigue. She has not been on the oral iron tablet recently. She felt much better after the Feraheme infusion in the past. She denied having any bleeding, bruises or ecchymosis. The patient denied having any significant chest pain, shortness of breath, cough or hemoptysis. She denied having any significant weight loss or night sweats. She had repeat CBC and iron study performed recently and she is here for evaluation and discussion of her lab results.   MEDICAL HISTORY: Past Medical History  Diagnosis Date  . Allergic rhinitis   . Asthma   . CAD (coronary artery disease)   . Hypertension   . Sleep apnea   . Diabetes mellitus type II   . Depression   . PTSD (post-traumatic stress disorder)   . 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 Carpal tunnel syndrome, right   . Hyperlipidemia   . Paroxysmal a-fib ([place])   . CAD (coronary artery disease) 01/30/2007    stents trivial coronary artery disease diffusely, with a recent deployment od a intracoronary artery stent, 3.5 x 12 mm driver stent with no more than 20-30% in- stents restenosis.done by Dr [name] with re-look on novenber 10 2008 revealing a widely patent stent with otherwise trival CAD and normal LV function  . Chest pain 07/17/12009    2 D Echo EF >55%  . Obesity     ALLERGIES:  is allergic to meprobamate.  MEDICATIONS:  Current Outpatient Prescriptions  Medication  Sig Dispense Refill  . ADVAIR DISKUS 250-50 MCG/DOSE AEPB INHALE ONE PUFF TWICE DAILY 60 each 5  . ARIPiprazole (ABILIFY) 15 MG tablet Take 1 tablet (15 mg total) by mouth daily. 90 tablet 0  . aspirin 81 MG tablet Take 81 mg by mouth daily.      . benztropine (COGENTIN) 0.5 MG tablet Take 1 tablet (0.5 mg total) by mouth at bedtime. 90 tablet 0  . CRESTOR 10 MG tablet TAKE ONE (1) TABLET BY MOUTH EVERY OTHER DAY (NEED OFFICE VISIT FOR ADDITIONAL REFILLS) 30 tablet 0  . diltiazem (CARDIZEM CD) 240 MG 24 hr capsule TAKE ONE CAPSULE BY MOUTH DAILY 90 capsule 2  . fexofenadine (ALLEGRA) 60 MG tablet Take 1 tablet (60 mg total) by mouth daily. Take 60 mg by mouth daily. 30 tablet 11  . furosemide (LASIX) 40 MG tablet TAKE ONE (1) TABLET BY MOUTH EVERY DAY 30 tablet 9  . HYDROcodone-acetaminophen (NORCO) 10-325 MG per tablet Take 1 tablet by mouth every 6 (six) hours as needed.  0  . irbesartan-hydrochlorothiazide (AVALIDE) 150-12.5 MG per tablet Take 1 tablet by mouth daily. 30 tablet 5  . isosorbide mononitrate (IMDUR) 30 MG 24 hr tablet TAKE ONE (1) TABLET BY MOUTH EVERY DAY 90 tablet 2  . metFORMIN (GLUCOPHAGE-XR) 500 MG 24 hr tablet TAKE ONE (1) TABLET BY MOUTH EVERY DAY WITH BREAKFAST (OV NEED FOR ADDITIONAL REFILLS) 30 tablet 0  . metoprolol (LOPRESSOR) 100 MG tablet TAKE ONE (1)  TABLET BY MOUTH TWO (2) TIMES DAILY 180 tablet 3  . mometasone (NASONEX) 50 MCG/ACT nasal spray Place 2 sprays into the nose daily. 17 g 2  . morphine (MS CONTIN) 15 MG 12 hr tablet Take 1 tablet by mouth at bedtime.  0  . nitroGLYCERIN (NITROSTAT) 0.4 MG SL tablet Place 1 tablet (0.4 mg total) under the tongue every 5 (five) minutes as needed. 25 tablet 3  . NONFORMULARY OR COMPOUNDED ITEM Allergy Vaccine 1:10 Given at [facility]    . pantoprazole (PROTONIX) 20 MG tablet Take 20 mg by mouth daily.    . potassium chloride (KLOR-CON) 20 MEQ packet Take 1 by mouth daily     . sertraline (ZOLOFT) 100 MG tablet  Take 2 by mouth once daily 180 tablet 0  . Trospium Chloride 60 MG CP24 TAKE ONE CAPSULE BY MOUTH DAILY 90 each 1  . warfarin (COUMADIN) 7.5 MG tablet TAKE ONE AND ONE-HALF (1 1/2) TO TWO (2)TABLETS BY MOUTH DAILY AS DIRECTED 165 tablet 1  . albuterol (PROVENTIL) (2.5 MG/3ML) 0.083% nebulizer solution Take 3 mLs (2.5 mg total) by nebulization every 6 (six) hours as needed. 360 mL 2  . [DISCONTINUED] SANCTURA XR 60 MG CP24 Take 1 capsule by mouth daily.     No current facility-administered medications for this visit.    SURGICAL HISTORY:  Past Surgical History  Procedure Laterality Date  . Cholecystectomy    . Left groin cyst    . Kidney stones    . Stress myocardial perfusion study  09/08/2010    normal; EF 75%  . Multilevel lumbar degenerative disc disease    . Hemorrhoid surgery    . Gallbladder surgery      REVIEW OF SYSTEMS:  A comprehensive review of systems was negative except for: Constitutional: positive for fatigue   PHYSICAL EXAMINATION: General appearance: alert, cooperative, fatigued and no distress Head: Normocephalic, without obvious abnormality, atraumatic Neck: no adenopathy, no JVD, supple, symmetrical, trachea midline and thyroid not enlarged, symmetric, no tenderness/mass/nodules Lymph nodes: Cervical, supraclavicular, and axillary nodes normal. Resp: clear to auscultation bilaterally Back: symmetric, no curvature. ROM normal. No CVA tenderness. Cardio: regular rate and rhythm, S1, S2 normal, no murmur, click, rub or gallop GI: soft, non-tender; bowel sounds normal; no masses,  no organomegaly Extremities: extremities normal, atraumatic, no cyanosis or edema  ECOG PERFORMANCE STATUS: 1 - Symptomatic but completely ambulatory  Blood pressure 125/89, pulse 72, temperature 98.3 F (36.8 C), temperature source Oral, resp. rate 18, height 5\' 2"  (1.575 m), weight 292 lb 1.6 oz (132.496 kg), SpO2 100 %.  LABORATORY DATA: Lab Results  Component Value Date   WBC 7.0  04/28/2015   HGB 10.5* 04/28/2015   HCT 33.8* 04/28/2015   MCV 67.4* 04/28/2015   PLT 323 04/28/2015      Chemistry      Component Value Date/Time   NA 137 12/08/2014 1153   NA 144 10/02/2012 0905   K 3.4* 12/08/2014 1153   K 3.4* 10/02/2012 0905   CL 93* 12/08/2014 1153   CL 101 10/02/2012 0905   CO2 30 12/08/2014 1153   CO2 31* 10/02/2012 0905   BUN 15 12/08/2014 1153   BUN 13.9 10/02/2012 0905   CREATININE 0.77 12/08/2014 1153   CREATININE 0.8 10/02/2012 0905   CREATININE 0.76 01/07/2012 1458      Component Value Date/Time   CALCIUM 9.9 12/08/2014 1153   CALCIUM 9.9 10/02/2012 0905   ALKPHOS 93 12/08/2014 1153   AST  18 12/08/2014 1153   ALT 19 12/08/2014 1153   BILITOT 0.6 12/08/2014 1153      RADIOGRAPHIC STUDIES: No results found.  ASSESSMENT AND PLAN: This is a very pleasant 64 years old [place] American female with history of persistent microcytic anemia questionable for anemia of chronic disease plus/minus iron deficiency. Other hemoglobinopathy cannot be ruled out at this point. I recommended for the patient to have hemoglobin electrophoresis performed today. I will arrange for her to receive Feraheme infusion for 2 doses on 10/21 and 05/16/2015 She would come back for followup visit in 3 months with repeat CBC and iron study. She was advised to call immediately if she has any concerning symptoms in the interval.  The patient voices understanding of current disease status and treatment options and is in agreement with the current care plan.  All questions were answered. The patient knows to call the clinic with any problems, questions or concerns. We can certainly see the patient much sooner if necessary.  Disclaimer: This note was dictated with voice recognition software. Similar sounding words can inadvertently be transcribed and may not be corrected upon review.

## 2015-05-05 NOTE — Progress Notes (Unsigned)
Ferheme and Activase both per [employer]. With [facility] will not require a pre-cert as long as done on an outpt. Basis.

## 2015-05-05 NOTE — Telephone Encounter (Signed)
Patient sent back to lab and given avs report and appointments for October 2016 and January 2017. [facility] ([facility] out) alerted re infusion for 10/21.

## 2015-05-06 ENCOUNTER — Ambulatory Visit (INDEPENDENT_AMBULATORY_CARE_PROVIDER_SITE_OTHER): Payer: Medicare Other | Admitting: Pharmacist Clinician (PhC)/ Clinical Pharmacy Specialist

## 2015-05-06 DIAGNOSIS — Z7901 Long term (current) use of anticoagulants: Secondary | ICD-10-CM | POA: Diagnosis not present

## 2015-05-06 DIAGNOSIS — I480 Paroxysmal atrial fibrillation: Secondary | ICD-10-CM | POA: Diagnosis not present

## 2015-05-06 LAB — POCT INR: INR: 1.4

## 2015-05-06 NOTE — Telephone Encounter (Signed)
Please schedule

## 2015-05-07 ENCOUNTER — Ambulatory Visit (HOSPITAL_COMMUNITY): Payer: Self-pay | Admitting: Psychiatry

## 2015-05-07 LAB — HEMOGLOBINOPATHY EVALUATION
HEMOGLOBIN OTHER: 0 %
Hgb A2 Quant: 2.4 % (ref 2.2–3.2)
Hgb A: 97.6 % (ref 96.8–97.8)
Hgb F Quant: 0 % (ref 0.0–2.0)
Hgb S Quant: 0 %

## 2015-05-07 NOTE — Telephone Encounter (Signed)
Michele [name] and gave verbal on VM

## 2015-05-08 ENCOUNTER — Ambulatory Visit: Payer: Self-pay

## 2015-05-08 ENCOUNTER — Other Ambulatory Visit: Payer: Self-pay | Admitting: Cardiovascular Disease

## 2015-05-08 ENCOUNTER — Ambulatory Visit (INDEPENDENT_AMBULATORY_CARE_PROVIDER_SITE_OTHER): Payer: No Typology Code available for payment source | Admitting: Psychology

## 2015-05-08 ENCOUNTER — Other Ambulatory Visit: Payer: Self-pay | Admitting: Physician Assistant

## 2015-05-08 DIAGNOSIS — F331 Major depressive disorder, recurrent, moderate: Secondary | ICD-10-CM | POA: Diagnosis not present

## 2015-05-08 NOTE — Progress Notes (Signed)
   THERAPIST PROGRESS NOTE  Session Time: 1.12pm-1.56pm  Participation Level: Active  Behavioral Response: Well GroomedAlert but tired, Depressed, slow in thought  Type of Therapy: Individual Therapy  Treatment Goals addressed: Diagnosis: MDD and goal 1.  Interventions: Supportive and Other: focus on self care  Summary: Michele [name] is a 64 y.o. female who presents with report of being very tired today.  Pt reported that she has IV scheduled tomorrow due to low iron.  Pt also reports her IBS is acting up today and feels due to stress of busy schedule today up at 6:30 this morning for variety of appointments.  Pt reported over the weekend she fell 2 times at home.  Pt reported she wanted to just "put behind her" but her daughter insisted she f/u w/ PCP.  Pt reported they discovered low iron, didn't find issue w/ her knees, and referred to neurologist. Pt states "i thought I was eating good".  Pt reported a little worry w/ hx of stroke.  Pt reported she also has started w/ physical therapy in her home.  Pt reported mood ok- just very tired. Pt was upset that she forgot her appointment w/Dr. Adele Schilder yesterday. Pt is looking forward to trip to [place] to visit her hometown, family and school reunion in late November.    Suicidal/Homicidal: Nowithout intent/plan  Therapist Response: Assessed pt current functioning per pt report.  Explored w/ pt recent medical concerns and pt thoughts about.  Reiterated w/ pt to not get in self blame thinking pattern but focus on f/u w/ providers and their recommendations for her self care.    Plan: Return again in 2 weeks. F/u as scheduled w/ Dr. Adele Schilder, neurologist and PCP recommendations.    Diagnosis: MDD    [name], [facility] 05/08/2015

## 2015-05-09 ENCOUNTER — Ambulatory Visit (HOSPITAL_BASED_OUTPATIENT_CLINIC_OR_DEPARTMENT_OTHER): Payer: Medicare Other

## 2015-05-09 VITALS — BP 119/63 | HR 61 | Temp 98.7°F | Resp 20

## 2015-05-09 DIAGNOSIS — D509 Iron deficiency anemia, unspecified: Secondary | ICD-10-CM

## 2015-05-09 MED ORDER — SODIUM CHLORIDE 0.9 % IV SOLN
Freq: Once | INTRAVENOUS | Status: AC
  Administered 2015-05-09: 10:00:00 via INTRAVENOUS

## 2015-05-09 MED ORDER — SODIUM CHLORIDE 0.9 % IV SOLN
510.0000 mg | Freq: Once | INTRAVENOUS | Status: AC
  Administered 2015-05-09: 510 mg via INTRAVENOUS
  Filled 2015-05-09: qty 17

## 2015-05-09 NOTE — Patient Instructions (Signed)

## 2015-05-12 ENCOUNTER — Telehealth: Payer: Self-pay | Admitting: Pharmacist Clinician (PhC)/ Clinical Pharmacy Specialist

## 2015-05-12 ENCOUNTER — Encounter: Payer: Self-pay | Admitting: Neurology

## 2015-05-12 ENCOUNTER — Ambulatory Visit (INDEPENDENT_AMBULATORY_CARE_PROVIDER_SITE_OTHER): Payer: Medicare Other | Admitting: Neurology

## 2015-05-12 ENCOUNTER — Telehealth: Payer: Self-pay | Admitting: Cardiovascular Disease

## 2015-05-12 VITALS — BP 138/77 | HR 80 | Ht 62.0 in | Wt 288.0 lb

## 2015-05-12 DIAGNOSIS — IMO0001 Reserved for inherently not codable concepts without codable children: Secondary | ICD-10-CM

## 2015-05-12 DIAGNOSIS — R269 Unspecified abnormalities of gait and mobility: Secondary | ICD-10-CM | POA: Diagnosis not present

## 2015-05-12 DIAGNOSIS — R202 Paresthesia of skin: Secondary | ICD-10-CM

## 2015-05-12 DIAGNOSIS — W19XXXA Unspecified fall, initial encounter: Secondary | ICD-10-CM

## 2015-05-12 MED ORDER — ISOSORBIDE MONONITRATE ER 30 MG PO TB24: ORAL_TABLET | ORAL | Status: DC

## 2015-05-12 MED ORDER — WARFARIN SODIUM 7.5 MG PO TABS: ORAL_TABLET | ORAL | Status: DC

## 2015-05-12 MED ORDER — NITROGLYCERIN 0.4 MG SL SUBL: 0.4000 mg | SUBLINGUAL_TABLET | SUBLINGUAL | Status: DC | PRN

## 2015-05-12 NOTE — Telephone Encounter (Signed)
°  STAT if patient is at the pharmacy , call can be transferred to refill team.   1. Which medications need to be refilled? Warfarin   2. Which pharmacy/location is medication to be sent to?[facility]   3. Do they need a 30 day or 90 day supply?[place]

## 2015-05-12 NOTE — Progress Notes (Signed)
PATIENT: Michele [name] DOB: 14-Nov-1950  Chief Complaint  Patient presents with  . Gait Problem    She is here with her sister, Michele [name], to have her unsteady gait and right-sided weakness evaluated.  She uses a cane when walking but has still had several falls.  She has recently had an abnormal CT brain scan.     HISTORICAL  Michele [name] is a 64 years old right-handed female, accompanied by her sister Michele [name], seen in refer by her primary care physician Dr. [name] in May 12 2015 for evaluation of unsteady gait, falling episodes  She has past medical history of hypertension, hyperlipidemia, borderline diabetes, coronary artery disease, status post a stent, atrial fibrillation, take Coumadin, also under the care of psychologist for PTSD, on polypharmacy, this including Abilify 15 mg daily, Zoloft 100x2 mg daily.  She had gradual onset unsteady gait since 2015, getting worse over the past couple months, fell twice in October 7 and April 27 2015, her right leg gave out, she also complains of right arm weakness, she has anemia, received iron infusion, she also complains of irritable bowel syndrome, she also complains of bowel and bladder incontinence, she fell, complains of right arm, flank, right leg pain, bilateral knee pain, bilateral lower extremity deep achy pain,  I have personally reviewed CAT scan April 28 2015: Periventricular small vessel disease, no acute lesions.   REVIEW OF SYSTEMS: Full 14 system review of systems performed and notable only for weight gain, fatigue, chest pain, swelling legs, ringing ears, shortness of breath, wheezing, incontinence, diarrhea, anemia, easy bruising, feeling cold, joint pain, achy muscles, running nose, skin sensitivity, memory loss, headaches, numbness, weakness, insomnia, restless legs, depression, anxiety, not enough sleep, decreased energy, suicidal thoughts  ALLERGIES: Allergies  Allergen Reactions  . Meprobamate Nausea  And Vomiting    HOME MEDICATIONS: Current Outpatient Prescriptions  Medication Sig Dispense Refill  . ADVAIR DISKUS 250-50 MCG/DOSE AEPB INHALE ONE PUFF TWICE DAILY 60 each 5  . albuterol (PROVENTIL) (2.5 MG/3ML) 0.083% nebulizer solution Take 3 mLs (2.5 mg total) by nebulization every 6 (six) hours as needed. 360 mL 2  . ARIPiprazole (ABILIFY) 15 MG tablet Take 1 tablet (15 mg total) by mouth daily. 90 tablet 0  . aspirin 81 MG tablet Take 81 mg by mouth daily.      . benztropine (COGENTIN) 0.5 MG tablet Take 1 tablet (0.5 mg total) by mouth at bedtime. 90 tablet 0  . CRESTOR 10 MG tablet TAKE ONE (1) TABLET BY MOUTH EVERY OTHER DAY (NEED OFFICE VISIT FOR ADDITIONAL REFILLS) 30 tablet 0  . diltiazem (CARDIZEM CD) 240 MG 24 hr capsule TAKE ONE CAPSULE BY MOUTH DAILY 90 capsule 2  . fexofenadine (ALLEGRA) 60 MG tablet Take 1 tablet (60 mg total) by mouth daily. Take 60 mg by mouth daily. 30 tablet 11  . furosemide (LASIX) 40 MG tablet TAKE ONE (1) TABLET BY MOUTH EVERY DAY 30 tablet 9  . HYDROcodone-acetaminophen (NORCO) 10-325 MG per tablet Take 1 tablet by mouth every 6 (six) hours as needed.  0  . irbesartan-hydrochlorothiazide (AVALIDE) 150-12.5 MG tablet TAKE ONE (1) TABLET BY MOUTH EVERY DAY 30 tablet 5  . isosorbide mononitrate (IMDUR) 30 MG 24 hr tablet TAKE ONE (1) TABLET BY MOUTH EVERY DAY 90 tablet 2  . metFORMIN (GLUCOPHAGE-XR) 500 MG 24 hr tablet TAKE ONE (1) TABLET BY MOUTH EVERY DAY WITH BREAKFAST (OFFICE VISIT NEEDED FOR REFILLS) 30 tablet 0  . metoprolol (  LOPRESSOR) 100 MG tablet TAKE ONE (1) TABLET BY MOUTH TWO (2) TIMES DAILY 180 tablet 3  . mometasone (NASONEX) 50 MCG/ACT nasal spray Place 2 sprays into the nose daily. 17 g 2  . nitroGLYCERIN (NITROSTAT) 0.4 MG SL tablet Place 1 tablet (0.4 mg total) under the tongue every 5 (five) minutes as needed. 25 tablet 3  . NONFORMULARY OR COMPOUNDED ITEM Allergy Vaccine 1:10 Given at [facility]    . pantoprazole (PROTONIX)  20 MG tablet Take 20 mg by mouth daily.    . sertraline (ZOLOFT) 100 MG tablet Take 2 by mouth once daily 180 tablet 0  . Trospium Chloride 60 MG CP24 TAKE ONE CAPSULE BY MOUTH DAILY 90 each 1  . warfarin (COUMADIN) 7.5 MG tablet TAKE ONE AND ONE-HALF (1 1/2) TO TWO (2)TABLETS BY MOUTH DAILY AS DIRECTED 165 tablet 1  . [DISCONTINUED] SANCTURA XR 60 MG CP24 Take 1 capsule by mouth daily.     No current facility-administered medications for this visit.    PAST MEDICAL HISTORY: Past Medical History  Diagnosis Date  . Allergic rhinitis   . Asthma   . CAD (coronary artery disease)   . Hypertension   . Sleep apnea   . Diabetes mellitus type II   . Depression   . PTSD (post-traumatic stress disorder)   . 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 Carpal tunnel syndrome, right   . Hyperlipidemia   . Paroxysmal a-fib ([place])   . CAD (coronary artery disease) 01/30/2007    stents trivial coronary artery disease diffusely, with a recent deployment od a intracoronary artery stent, 3.5 x 12 mm driver stent with no more than 20-30% in- stents restenosis.done by Dr [name] with re-look on novenber 10 2008 revealing a widely patent stent with otherwise trival CAD and normal LV function  . Chest pain 07/17/12009    2 D Echo EF >55%  . Obesity   . Gait instability     PAST SURGICAL HISTORY: Past Surgical History  Procedure Laterality Date  . Cholecystectomy    . Left groin cyst    . Kidney stones    . Stress myocardial perfusion study  09/08/2010    normal; EF 75%  . Multilevel lumbar degenerative disc disease    . Hemorrhoid surgery    . Gallbladder surgery      FAMILY HISTORY: Family History  Problem Relation Age of Onset  . Coronary artery disease Mother   . Coronary artery disease Father   . Depression Father     SOCIAL HISTORY:  Social History   Social History  . Marital Status: Divorced    Spouse Name: N/A  . Number of Children: 2  . Years of Education: 14    Occupational History  . 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 Smoking status: Never Smoker   . Smokeless tobacco: Never Used  . Alcohol Use: No  . Drug Use: No  . Sexual Activity: Not on file   Other Topics Concern  . Not on file   Social History Narrative   Lives at home alone.   Right-handed.   Occasional caffeine use.     PHYSICAL EXAM   Filed Vitals:   05/12/15 0943  BP: 138/77  Pulse: 80  Height: 5\' 2"  (1.575 m)  Weight: 288 lb (130.636 kg)    Not recorded      Body mass index is 52.66 kg/(m^2).  PHYSICAL EXAMNIATION:  Gen: NAD, conversant, well nourised, obese, well groomed                     Cardiovascular: Regular rate rhythm, no peripheral edema, warm, nontender. Eyes: Conjunctivae clear without exudates or hemorrhage Neck: Supple, no carotid bruise. Pulmonary: Clear to auscultation bilaterally   NEUROLOGICAL EXAM:  MENTAL STATUS: Speech:    Speech is normal; fluent and spontaneous with normal comprehension.  Cognition: Depressed looking middle-age female      Orientation to time, place and person     Normal recent and remote memory     Normal Attention span and concentration     Normal Language, naming, repeating,spontaneous speech     Fund of knowledge   CRANIAL NERVES: CN II: Visual fields are full to confrontation. Pupils are round equal and briskly reactive to light. CN III, IV, VI: extraocular movement are normal. No ptosis. CN V: Facial sensation is intact to pinprick in all 3 divisions bilaterally. Corneal responses are intact.  CN VII: Face is symmetric with normal eye closure and smile. CN VIII: Hearing is normal to rubbing fingers CN IX, X: Palate elevates symmetrically. Phonation is normal. CN XI: Head turning and shoulder shrug are intact CN XII: Tongue is midline with normal movements and no atrophy.  MOTOR: Obesity, mild right hip flexion, ankle dorsiflexion weakness   REFLEXES: Reflexes are 3 and  symmetric at the biceps, triceps, knees, and ankles. Plantar responses arextensor bilaterally  Sensory: Length dependent decreased to light touch, pinprick and vibration sense to distal shin.   COORDINATION: Rapid alternating movements and fine finger movements are intact. There is no dysmetria on finger-to-nose and heel-knee-shin.    GAIT/STANCE: She need assistant to get up from seated position, cautious, mildly unsteady   DIAGNOSTIC DATA (LABS, IMAGING, TESTING) - I reviewed patient records, labs, notes, testing and imaging myself where available.   ASSESSMENT AND PLAN  Michele [name] is a 64 y.o. female   Gait difficulty, falling episodes  Multifactorial, this includes obesity, bilateral knee, leg pain, potential central nervous system etiology  Evidence of periventricular small vessel disease, incontinence, hyperreflexia  Differentiation diagnosis including extensive periventricular white matter disease versus cervical spondylitic myelopathy  MRI of brain, cervical spine,  Physical therapy   Marcial Pacas, M.D. Ph.D.  [facility] [address], [place], [place] [address] Ph: [phone] Fax: [phone]  CC: [name], MD

## 2015-05-12 NOTE — Telephone Encounter (Signed)
Patient called asking for refill on isosorbide and nitorglycerin

## 2015-05-13 ENCOUNTER — Ambulatory Visit (HOSPITAL_COMMUNITY): Payer: Self-pay | Admitting: Psychiatry

## 2015-05-15 ENCOUNTER — Telehealth: Payer: Self-pay | Admitting: Neurology

## 2015-05-15 ENCOUNTER — Ambulatory Visit (INDEPENDENT_AMBULATORY_CARE_PROVIDER_SITE_OTHER): Payer: Medicare Other

## 2015-05-15 ENCOUNTER — Ambulatory Visit (INDEPENDENT_AMBULATORY_CARE_PROVIDER_SITE_OTHER): Payer: Self-pay

## 2015-05-15 DIAGNOSIS — W19XXXA Unspecified fall, initial encounter: Secondary | ICD-10-CM

## 2015-05-15 DIAGNOSIS — R269 Unspecified abnormalities of gait and mobility: Secondary | ICD-10-CM | POA: Diagnosis not present

## 2015-05-15 DIAGNOSIS — IMO0001 Reserved for inherently not codable concepts without codable children: Secondary | ICD-10-CM

## 2015-05-15 DIAGNOSIS — R202 Paresthesia of skin: Secondary | ICD-10-CM

## 2015-05-15 DIAGNOSIS — Z0289 Encounter for other administrative examinations: Secondary | ICD-10-CM

## 2015-05-15 DIAGNOSIS — J309 Allergic rhinitis, unspecified: Secondary | ICD-10-CM

## 2015-05-15 NOTE — Telephone Encounter (Addendum)
I have personally reviewed MRI of the brain and cervical spine,    MRI of the brain showed extensive periventricular small vessel disease, acute to subacute left basal ganglion stroke, likely explaining her worsening right-sided weakness  MRI cervical showed multilevel cervical degenerative disc disease most severe C5-6, moderate canal stenosis, but no cord signal changes,   She has pending appointment in June 03 2015, may move up her follow-up appointment early next week,  Keep Coumadin

## 2015-05-16 ENCOUNTER — Telehealth: Payer: Self-pay | Admitting: Cardiovascular Disease

## 2015-05-16 ENCOUNTER — Ambulatory Visit (HOSPITAL_BASED_OUTPATIENT_CLINIC_OR_DEPARTMENT_OTHER): Payer: Medicare Other

## 2015-05-16 ENCOUNTER — Ambulatory Visit (HOSPITAL_BASED_OUTPATIENT_CLINIC_OR_DEPARTMENT_OTHER): Payer: Medicare Other | Admitting: Nurse Practitioner

## 2015-05-16 ENCOUNTER — Other Ambulatory Visit: Payer: Self-pay | Admitting: Nurse Practitioner

## 2015-05-16 ENCOUNTER — Telehealth: Payer: Self-pay | Admitting: Nurse Practitioner

## 2015-05-16 VITALS — BP 91/45 | HR 66 | Temp 97.0°F | Resp 20

## 2015-05-16 DIAGNOSIS — I10 Essential (primary) hypertension: Secondary | ICD-10-CM

## 2015-05-16 DIAGNOSIS — I959 Hypotension, unspecified: Secondary | ICD-10-CM

## 2015-05-16 DIAGNOSIS — I4891 Unspecified atrial fibrillation: Secondary | ICD-10-CM

## 2015-05-16 DIAGNOSIS — D509 Iron deficiency anemia, unspecified: Secondary | ICD-10-CM | POA: Diagnosis not present

## 2015-05-16 MED ORDER — SODIUM CHLORIDE 0.9 % IV SOLN
Freq: Once | INTRAVENOUS | Status: AC
  Administered 2015-05-16: 12:00:00 via INTRAVENOUS

## 2015-05-16 MED ORDER — SODIUM CHLORIDE 0.9 % IV SOLN
510.0000 mg | Freq: Once | INTRAVENOUS | Status: AC
  Administered 2015-05-16: 510 mg via INTRAVENOUS
  Filled 2015-05-16: qty 17

## 2015-05-16 NOTE — Telephone Encounter (Signed)
Pt in with NP, [name], today for iron infusion and has a low BP.  Normally when in office pt BP systolic 291-916 mmHg.  Today pt BP has been 91/45 and she is asymptomatic.  Pt only c/o of being tired. Pt symptoms reviewed with DOD.  Pt to take half Lopressor (50 mg) tonight and Hold Lasix 40 mg tomorrow 10/29.  Called pt to let her know orders and phone went straight to voice mail. L/M for pt to call office before 5pm to make sure she understood orders.   Pt to check BP over the weekend and to go to the emergency room if she starts to have symptoms. Nurse will call pt on Monday to check on her.

## 2015-05-16 NOTE — Telephone Encounter (Signed)
per pof to sch pt in sympton mgmt-sch

## 2015-05-16 NOTE — Telephone Encounter (Signed)
Left patient a message to return my call

## 2015-05-16 NOTE — Progress Notes (Signed)
Patient's blood pressure is lower than what she normally runs.  Patient states she took her blood pressure medication this morning.  She denies any dizziness. At completion of feraheme and 30 minute wait - blood pressure lower that admitting b/p.  Selena Lesser notified and gave patient instructions.

## 2015-05-16 NOTE — Patient Instructions (Signed)

## 2015-05-16 NOTE — Telephone Encounter (Signed)
[facility] calling from [facility] Because

## 2015-05-16 NOTE — Telephone Encounter (Signed)
error 

## 2015-05-16 NOTE — Telephone Encounter (Signed)
Called pt to verify that she received my message. Pt asked that is speak with [name], sister.  Reviewed orders to take half Lopressor this evening (25m) and hold Lasix 40 mg tomorrow only.   Reviewed symptoms for pt when need to call 911 or go to the emergency room.  Verbalized understanding and no questions at this time.

## 2015-05-18 ENCOUNTER — Encounter: Payer: Self-pay | Admitting: Nurse Practitioner

## 2015-05-18 DIAGNOSIS — I959 Hypotension, unspecified: Secondary | ICD-10-CM | POA: Insufficient documentation

## 2015-05-18 NOTE — Progress Notes (Signed)
SYMPTOM MANAGEMENT CLINIC   HPI: Michele [name] 64 y.o. female diagnosed with iron deficiency anemia.  Currently undergoing intermittent Feraheme infusions.  Patient has a history of chronic hypertension; as well and is atrial fibrillation.  She continues to take Coumadin on a daily basis.  Has been prescribed Cardizem, Lasix, Avalide, Imdur, and Lopressor per Dr. Quay Burow cardiologist.  Patient presented to the [facility] today with initial blood pressure of 98/57.  Following the Feraheme infusion.  Patient's blood pressure decreased down to 91/45.  Patient denies any associated dizziness, weakness, or other symptoms whatsoever.  All other vital signs remained stable.  Called and spoke to [place], [place] at Dr. Kennon Holter office-and was advised that patient should decrease the Lopressor by half a tablet for this evening's dose only.  She was also advised to call the cardiologist office first thing on Monday morning for further follow-up.  Patient and her family were advised to go directly to the emergency department over the weekend if she develops any worsening symptoms.   HPI  ROS  Past Medical History  Diagnosis Date  . Allergic rhinitis   . Asthma   . CAD (coronary artery disease)   . Hypertension   . Sleep apnea   . Diabetes mellitus type II   . Depression   . PTSD (post-traumatic stress disorder)   . 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 Carpal tunnel syndrome, right   . Hyperlipidemia   . Paroxysmal a-fib ([place])   . CAD (coronary artery disease) 01/30/2007    stents trivial coronary artery disease diffusely, with a recent deployment od a intracoronary artery stent, 3.5 x 12 mm driver stent with no more than 20-30% in- stents restenosis.done by Dr [name] with re-look on novenber 10 2008 revealing a widely patent stent with otherwise trival CAD and normal LV function  . Chest pain 07/17/12009    2 D Echo EF >55%  . Obesity   . Gait instability      Past Surgical History  Procedure Laterality Date  . Cholecystectomy    . Left groin cyst    . Kidney stones    . Stress myocardial perfusion study  09/08/2010    normal; EF 75%  . Multilevel lumbar degenerative disc disease    . Hemorrhoid surgery    . Gallbladder surgery      has DM; OBESITY, MORBID: PMI 58; Obstructive sleep apnea; Essential hypertension; Coronary atherosclerosis; Seasonal and perennial allergic rhinitis; Allergic-infective asthma; REFLUX, ESOPHAGEAL; Diabetes mellitus, type II ([place]); Menopausal and perimenopausal disorder; Depression; Urethrocele(618.03); Anemia, iron deficiency; Paroxysmal a-fib ([place]); Vitamin D deficiency; PTSD (post-traumatic stress disorder); Abnormal brain MRI; PAD (peripheral artery disease) (HCC); PFO (patent foramen ovale); Major depressive disorder, recurrent episode, moderate ([place]); Long term current use of anticoagulant therapy; Chest pain on exertion; Hyperlipidemia; and Hypotension on her problem list.    is allergic to meprobamate.    Medication List       This list is accurate as of: 05/16/15 11:59 PM.  Always use your most recent med list.               ADVAIR DISKUS 250-50 MCG/DOSE Aepb  Generic drug:  Fluticasone-Salmeterol  INHALE ONE PUFF TWICE DAILY     albuterol (2.5 MG/3ML) 0.083% nebulizer solution  Commonly known as:  PROVENTIL  Take 3 mLs (2.5 mg total) by nebulization every 6 (six) hours as needed.     ARIPiprazole 15 MG tablet  Commonly  known as:  ABILIFY  Take 1 tablet (15 mg total) by mouth daily.     aspirin 81 MG tablet  Take 81 mg by mouth daily.     benztropine 0.5 MG tablet  Commonly known as:  COGENTIN  Take 1 tablet (0.5 mg total) by mouth at bedtime.     CRESTOR 10 MG tablet  Generic drug:  rosuvastatin  TAKE ONE (1) TABLET BY MOUTH EVERY OTHER DAY (NEED OFFICE VISIT FOR ADDITIONAL REFILLS)     diltiazem 240 MG 24 hr capsule  Commonly known as:  CARDIZEM CD  TAKE ONE CAPSULE BY MOUTH  DAILY     fexofenadine 60 MG tablet  Commonly known as:  ALLEGRA  Take 1 tablet (60 mg total) by mouth daily. Take 60 mg by mouth daily.     furosemide 40 MG tablet  Commonly known as:  LASIX  TAKE ONE (1) TABLET BY MOUTH EVERY DAY     HYDROcodone-acetaminophen 10-325 MG tablet  Commonly known as:  NORCO  Take 1 tablet by mouth every 6 (six) hours as needed.     irbesartan-hydrochlorothiazide 150-12.5 MG tablet  Commonly known as:  AVALIDE  TAKE ONE (1) TABLET BY MOUTH EVERY DAY     isosorbide mononitrate 30 MG 24 hr tablet  Commonly known as:  IMDUR  TAKE ONE (1) TABLET BY MOUTH EVERY DAY     metFORMIN 500 MG 24 hr tablet  Commonly known as:  GLUCOPHAGE-XR  TAKE ONE (1) TABLET BY MOUTH EVERY DAY WITH BREAKFAST (OFFICE VISIT NEEDED FOR REFILLS)     metoprolol 100 MG tablet  Commonly known as:  LOPRESSOR  TAKE ONE (1) TABLET BY MOUTH TWO (2) TIMES DAILY     mometasone 50 MCG/ACT nasal spray  Commonly known as:  NASONEX  Place 2 sprays into the nose daily.     nitroGLYCERIN 0.4 MG SL tablet  Commonly known as:  NITROSTAT  Place 1 tablet (0.4 mg total) under the tongue every 5 (five) minutes as needed.     NONFORMULARY OR COMPOUNDED ITEM  Allergy Vaccine 1:10 Given at [facility]     pantoprazole 20 MG tablet  Commonly known as:  PROTONIX  Take 20 mg by mouth daily.     sertraline 100 MG tablet  Commonly known as:  ZOLOFT  Take 2 by mouth once daily     Trospium Chloride 60 MG Cp24  TAKE ONE CAPSULE BY MOUTH DAILY     warfarin 7.5 MG tablet  Commonly known as:  COUMADIN  TAKE ONE AND ONE-HALF (1 1/2) TO TWO (2)TABLETS BY MOUTH DAILY AS DIRECTED         PHYSICAL EXAMINATION  Oncology Vitals 05/16/2015 05/16/2015 05/12/2015 05/09/2015 05/09/2015 05/05/2015 04/28/2015  Height - - 158 cm - - 158 cm -  Weight - - 130.636 kg - - 132.496 kg -  Weight (lbs) - - 288 lbs - - 292 lbs 2 oz -  BMI (kg/m2) - - 52.68 kg/m2 - - 53.43 kg/m2 -  Temp 97 99.1 - 98.7  97.7 98.3 98.8  Pulse 66 70 80 - 61 72 -  Resp - 20 - - 20 18 -  Resp (Historical as of 02/17/12) - - - - - - -  SpO2 100 98 - - 100 100 -  BSA (m2) - - 2.39 m2 - - 2.41 m2 -  Some encounter information is confidential and restricted. Go to Review Flowsheets activity to see all data.   BP  Readings from Last 3 Encounters:  05/16/15 91/45  05/12/15 138/77  05/09/15 119/63    Physical Exam  Constitutional: She is oriented to person, place, and time and well-developed, well-nourished, and in no distress.  HENT:  Head: Normocephalic and atraumatic.  Eyes: Conjunctivae and EOM are normal. Pupils are equal, round, and reactive to light. Right eye exhibits no discharge. Left eye exhibits no discharge. No scleral icterus.  Neck: Normal range of motion.  Pulmonary/Chest: Effort normal. No respiratory distress.  Musculoskeletal: Normal range of motion.  Neurological: She is alert and oriented to person, place, and time.  Skin: Skin is warm and dry.  Psychiatric: Affect normal.  Nursing note and vitals reviewed.   LABORATORY DATA:. No visits with results within 3 Day(s) from this visit. Latest known visit with results is:  Anti-coag visit on 05/06/2015  Component Date Value Ref Range Status  . INR 05/06/2015 1.4   Final     RADIOGRAPHIC STUDIES: Mr Brain Wo Contrast  05/15/2015  [facility] NEUROIMAGING REPORT STUDY DATE: 05/15/15 PATIENT NAME: ZYONA PETTAWAY DOB: Jun 11, 1951 MRN: [id number] ORDERING CLINICIAN: Marcial Pacas, MD PhD CLINICAL HISTORY: 64 year old female with gait difficulty and right sided weakness. EXAM: MRI brain (without) TECHNIQUE: MRI of the brain without contrast was obtained utilizing 5 mm axial slices with T1, T2, T2 flair, T2 star gradient echo and diffusion weighted views.  T1 sagittal and T2 coronal views were obtained. CONTRAST: no IMAGING SITE: [facility] (1.2 Tesla MRI) FINDINGS: Acute-subacute left internal capsule (posterior  limb) and basal ganglia ischemic infarct noted with central cystic component. The rim shows restricted diffusion, suggesting an acute process. The central component may be subacute-chronic, especially given the degree of T1 hypointensity. It measures 66m in diameter. Elsewhere are mild scattered periventricular and subcortical and pontine chronic small vessel ischemic disease. The cortical sulci, fissures and cisterns are normal in size and appearance. Lateral, third and fourth ventricle are normal in size and appearance. No extra-axial fluid collections are seen. No evidence of mass effect or midline shift.  On sagittal views the posterior fossa, pituitary gland and corpus callosum are unremarkable. No evidence of intracranial hemorrhage on gradient-echo views. The orbits and their contents, paranasal sinuses and calvarium are unremarkable.  Intracranial flow voids are present.   05/15/2015  Abnormal MRI brain (without) demonstrating: 1. Acute-subacute ischemic infarction in the left internal capsule (posterior limb) and basal ganglia. It measures 836min diameter. 2. Mild chronic small vessel ischemic disease. 3. Compared to CT from 04/28/15, this left subcortical ischemic infarction was slightly noticeable in retrospect, consistent with subacute timing. Compared to MRI from 06/04/08, this is a new finding. INTERPRETING PHYSICIAN: VIPenni BombardMD Certified in Neurology, Neurophysiology and Neuroimaging [facility] [address] [id number] Mr Cervical Spine Wo Contrast  05/15/2015  [facility] NEUROIMAGING REPORT STUDY DATE: 05/15/15 PATIENT NAME: ShJAZMINN POMALESOB: 8/11-01-1952RN: [id number] CLINICIAN: YiMarcial PacasMD PhD CLINICAL HISTORY: [age]ear old female with right sided weakness and multiple falls. EXAM: MRI cervical spine (without) TECHNIQUE: MRI of the cervical spine was obtained utilizing 3 mm sagittal  slices from the posterior fossa down to the T3-4 level with T1, T2 and inversion recovery views. In addition 4 mm axial slices from C2H9-6own to T1-2 level were included with T2 and gradient echo views. CONTRAST: no [place]) FINDINGS: Artifact noted due  to body habitus, with decreased signal to noise. On sagittal views the vertebral bodies have normal height and alignment.  The spinal cord is normal in size and appearance. Disc bulging from C3-4 to T2-3. Degenerative endplate disease at Y24. The posterior fossa, pituitary gland and paraspinal soft tissues are unremarkable.  Mild edema noted within the inter-spinous ligament at C5-6 level. On axial views: C2-3: no spinal stenosis or foraminal narrowing C3-4: disc bulging and uncovertebral joint hypertrophy with mild spinal stenosis and severe right and moderate left foraminal stenosis C4-5: disc bulging with mild left foraminal stenosis C5-6: disc bulging and facet hypertrophy with mild spinal stenosis and severe biforaminal stenosis C6-7: disc bulging with moderate left foraminal stenosis C7-T1: disc bulging with no spinal stenosis or foraminal narrowing T1-2: no spinal stenosis or foraminal narrowing Limited views of the soft tissues of the head and neck are unremarkable.   05/15/2015  Abnormal MRI cervical spine (without) demonstrating: 1. At C5-6: disc bulging and facet hypertrophy with mild spinal stenosis and severe biforaminal stenosis. 2. At C3-4: disc bulging and uncovertebral joint hypertrophy with mild spinal stenosis and severe right and moderate left foraminal stenosis. 3. At C6-7: disc bulging with moderate left foraminal stenosis. 4. At C4-5: disc bulging with mild left foraminal stenosis. 5. Mild edema noted within the inter-spinous ligament at C5-6 level. This could be related to patient's multiple falls. 6. No cord signal abnormalites. INTERPRETING PHYSICIAN: Penni Bombard, MD Certified in  Neurology, Neurophysiology and Neuroimaging [facility] [address], [place], [place] [address] [phone]    ASSESSMENT/PLAN:    Anemia, iron deficiency Patient has a history of chronic iron deficiency anemia; and presented to the [facility] today to receive her second cycle of Feraheme infusion.  She tolerated the Feraheme infusion with no difficulties.  Patient is scheduled to return on 07/30/2015 for labs and a follow-up visit.  Hypotension Patient has a history of chronic hypertension; as well and is atrial fibrillation.  She continues to take Coumadin on a daily basis.  Has been prescribed Cardizem, Lasix, Avalide, Imdur, and Lopressor per Dr. Quay Burow cardiologist.  Patient presented to the [facility] today with initial blood pressure of 98/57.  Following the Feraheme infusion.  Patient's blood pressure decreased down to 91/45.  Patient denies any associated dizziness, weakness, or other symptoms whatsoever.  All other vital signs remained stable.  Called and spoke to [place], [place] at Dr. Kennon Holter office-and was advised that patient should decrease the Lopressor by half a tablet for this evening's dose only.  She was also advised to call the cardiologist office first thing on Monday morning for further follow-up.  Patient and her family were advised to go directly to the emergency department over the weekend if she develops any worsening symptoms.   Patient stated understanding of all instructions; and was in agreement with this plan of care. The patient knows to call the clinic with any problems, questions or concerns.   Review/collaboration with Dr. Julien Nordmann regarding all aspects of patient's visit today.   Total time spent with patient was 25 minutes;  with greater than 75 percent of that time spent in face to face counseling regarding patient's symptoms,  and coordination of care and follow up.  Disclaimer:This dictation was prepared with  Dragon/digital dictation along with [employer]. Any transcriptional errors that result from this process are unintentional.  Drue Second, NP [id number]

## 2015-05-18 NOTE — Assessment & Plan Note (Signed)
Patient has a history of chronic iron deficiency anemia; and presented to the [facility] today to receive her second cycle of Feraheme infusion.  She tolerated the Feraheme infusion with no difficulties.  Patient is scheduled to return on 07/30/2015 for labs and a follow-up visit.

## 2015-05-18 NOTE — Assessment & Plan Note (Signed)
Patient has a history of chronic hypertension; as well and is atrial fibrillation.  She continues to take Coumadin on a daily basis.  Has been prescribed Cardizem, Lasix, Avalide, Imdur, and Lopressor per Dr. Quay Burow cardiologist.  Patient presented to the [facility] today with initial blood pressure of 98/57.  Following the Feraheme infusion.  Patient's blood pressure decreased down to 91/45.  Patient denies any associated dizziness, weakness, or other symptoms whatsoever.  All other vital signs remained stable.  Called and spoke to [place], [place] at Dr. Kennon Holter office-and was advised that patient should decrease the Lopressor by half a tablet for this evening's dose only.  She was also advised to call the cardiologist office first thing on Monday morning for further follow-up.  Patient and her family were advised to go directly to the emergency department over the weekend if she develops any worsening symptoms.

## 2015-05-19 ENCOUNTER — Encounter (HOSPITAL_COMMUNITY): Payer: Self-pay | Admitting: Psychiatry

## 2015-05-19 ENCOUNTER — Telehealth: Payer: Self-pay | Admitting: *Deleted

## 2015-05-19 ENCOUNTER — Ambulatory Visit (INDEPENDENT_AMBULATORY_CARE_PROVIDER_SITE_OTHER): Payer: No Typology Code available for payment source | Admitting: Psychiatry

## 2015-05-19 VITALS — BP 108/63 | HR 80 | Ht 62.0 in | Wt 288.0 lb

## 2015-05-19 DIAGNOSIS — F331 Major depressive disorder, recurrent, moderate: Secondary | ICD-10-CM

## 2015-05-19 DIAGNOSIS — F4310 Post-traumatic stress disorder, unspecified: Secondary | ICD-10-CM

## 2015-05-19 DIAGNOSIS — F323 Major depressive disorder, single episode, severe with psychotic features: Secondary | ICD-10-CM

## 2015-05-19 MED ORDER — ARIPIPRAZOLE 10 MG PO TABS: 10.0000 mg | ORAL_TABLET | Freq: Every day | ORAL | Status: DC

## 2015-05-19 MED ORDER — BENZTROPINE MESYLATE 0.5 MG PO TABS: 0.5000 mg | ORAL_TABLET | Freq: Every day | ORAL | Status: DC

## 2015-05-19 MED ORDER — SERTRALINE HCL 100 MG PO TABS: ORAL_TABLET | ORAL | Status: DC
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
## 2015-05-19 NOTE — Telephone Encounter (Signed)
Left message to call back  

## 2015-05-19 NOTE — Telephone Encounter (Signed)
Spoke to patient - she is coming in earlier for further discussion of MRI results.

## 2015-05-19 NOTE — Telephone Encounter (Signed)
LM to check status of pt following [facility] visit. See how pt is doing following the change in medication.

## 2015-05-21 ENCOUNTER — Ambulatory Visit (INDEPENDENT_AMBULATORY_CARE_PROVIDER_SITE_OTHER): Payer: Medicare Other | Admitting: Pharmacist Clinician (PhC)/ Clinical Pharmacy Specialist

## 2015-05-21 ENCOUNTER — Telehealth: Payer: Self-pay | Admitting: Internal Medicine

## 2015-05-21 DIAGNOSIS — Z7901 Long term (current) use of anticoagulants: Secondary | ICD-10-CM

## 2015-05-21 DIAGNOSIS — I480 Paroxysmal atrial fibrillation: Secondary | ICD-10-CM | POA: Diagnosis not present

## 2015-05-21 LAB — POCT INR: INR: 5.2

## 2015-05-21 NOTE — Telephone Encounter (Signed)
Allergy Serum Extract Date Mixed: 05/21/15 Vial: 2 Strength: 1:10 Here/Mail/Pick Up: here Mixed By: tbs

## 2015-05-22 ENCOUNTER — Ambulatory Visit (INDEPENDENT_AMBULATORY_CARE_PROVIDER_SITE_OTHER): Payer: Medicare Other | Admitting: Neurology

## 2015-05-22 ENCOUNTER — Ambulatory Visit (INDEPENDENT_AMBULATORY_CARE_PROVIDER_SITE_OTHER): Payer: Medicare Other

## 2015-05-22 ENCOUNTER — Encounter: Payer: Self-pay | Admitting: Neurology

## 2015-05-22 VITALS — BP 122/80 | HR 91 | Ht 62.0 in | Wt 284.0 lb

## 2015-05-22 DIAGNOSIS — J309 Allergic rhinitis, unspecified: Secondary | ICD-10-CM

## 2015-05-22 DIAGNOSIS — R930 Abnormal findings on diagnostic imaging of skull and head, not elsewhere classified: Secondary | ICD-10-CM

## 2015-05-22 DIAGNOSIS — M4802 Spinal stenosis, cervical region: Secondary | ICD-10-CM | POA: Insufficient documentation

## 2015-05-22 DIAGNOSIS — R9089 Other abnormal findings on diagnostic imaging of central nervous system: Secondary | ICD-10-CM

## 2015-05-22 DIAGNOSIS — I639 Cerebral infarction, unspecified: Secondary | ICD-10-CM

## 2015-05-22 DIAGNOSIS — R269 Unspecified abnormalities of gait and mobility: Secondary | ICD-10-CM | POA: Insufficient documentation

## 2015-05-22 DIAGNOSIS — I480 Paroxysmal atrial fibrillation: Secondary | ICD-10-CM | POA: Diagnosis not present

## 2015-05-22 NOTE — Progress Notes (Signed)
PATIENT: Michele [name] DOB: Oct 29, 1950  Chief Complaint  Patient presents with  . Gait Problem    She is here to discuss her MRI results.     HISTORICAL  Michele [name] is a 64 years old right-handed female, accompanied by her sister Michele [name], seen in refer by her primary care physician Dr. [name] in May 12 2015 for evaluation of unsteady gait, falling episodes  She has past medical history of hypertension, hyperlipidemia, borderline diabetes, coronary artery disease, status post a stent, atrial fibrillation, take Coumadin, also under the care of psychologist for PTSD, on polypharmacy, this including Abilify 15 mg daily, Zoloft 100x2 mg daily.  She had gradual onset unsteady gait since 2015, getting worse over the past couple months, fell twice in October 7 and April 27 2015, her right leg gave out, she also complains of right arm weakness, she has anemia, received iron infusion, she also complains of irritable bowel syndrome, she also complains of bowel and bladder incontinence, complains of right arm, flank, right leg pain, bilateral knee pain, bilateral lower extremity deep achy pain,  I have personally reviewed CAT scan April 28 2015: Periventricular small vessel disease, no acute lesions.  UPDATE May 22 2015: She came with her daughter at today's clinical visit, walking with a cane.  She is most frustrated by her frequent falling episode, mild headaches, bilateral knee pain.  She also complain of bladder incontinence, urgency, wears pads since 2014,  She has occasional bowel accident.  She denies significant neck pain.   We have reviewed MRI films in May 15 2015: MRI brain: Acute-subacute ischemic infarction in the left internal capsule (posterior limb) and basal ganglia. It measures 29mm in diameter. moderate chronic small vessel disease. MRI cervical: Multilevel degenerative disc disease, mild to moderate canal stenosis multilevel foraminal stenosis at different  levels. There was mild edema in the intraspinal ligament of C5-6 level, no spinal cord signal changes,   REVIEW OF SYSTEMS: Full 14 system review of systems performed and notable only for activity change, chill, ringing ears, eye itching, light sensitivity, wheezing, shortness of breath, chest pain, leg swelling, palpitation, excessive thirst, diarrhea, insomnia, apnea, environmental allergy, food allergy, incontinence of bladder, frequent urination, urgency, joint pain, low back pain, muscle cramps, walking difficulty, passed easily, anemia, memory loss, dizziness, numbness, weakness, depression, diabetes  ALLERGIES: Allergies  Allergen Reactions  . Meprobamate Nausea And Vomiting    HOME MEDICATIONS: Current Outpatient Prescriptions  Medication Sig Dispense Refill  . ADVAIR DISKUS 250-50 MCG/DOSE AEPB INHALE ONE PUFF TWICE DAILY 60 each 5  . albuterol (PROVENTIL) (2.5 MG/3ML) 0.083% nebulizer solution Take 3 mLs (2.5 mg total) by nebulization every 6 (six) hours as needed. 360 mL 2  . ARIPiprazole (ABILIFY) 15 MG tablet Take 1 tablet (15 mg total) by mouth daily. 90 tablet 0  . aspirin 81 MG tablet Take 81 mg by mouth daily.      . benztropine (COGENTIN) 0.5 MG tablet Take 1 tablet (0.5 mg total) by mouth at bedtime. 90 tablet 0  . CRESTOR 10 MG tablet TAKE ONE (1) TABLET BY MOUTH EVERY OTHER DAY (NEED OFFICE VISIT FOR ADDITIONAL REFILLS) 30 tablet 0  . diltiazem (CARDIZEM CD) 240 MG 24 hr capsule TAKE ONE CAPSULE BY MOUTH DAILY 90 capsule 2  . fexofenadine (ALLEGRA) 60 MG tablet Take 1 tablet (60 mg total) by mouth daily. Take 60 mg by mouth daily. 30 tablet 11  . furosemide (LASIX) 40 MG tablet TAKE  ONE (1) TABLET BY MOUTH EVERY DAY 30 tablet 9  . HYDROcodone-acetaminophen (NORCO) 10-325 MG per tablet Take 1 tablet by mouth every 6 (six) hours as needed.  0  . irbesartan-hydrochlorothiazide (AVALIDE) 150-12.5 MG tablet TAKE ONE (1) TABLET BY MOUTH EVERY DAY 30 tablet 5  . isosorbide  mononitrate (IMDUR) 30 MG 24 hr tablet TAKE ONE (1) TABLET BY MOUTH EVERY DAY 90 tablet 2  . metFORMIN (GLUCOPHAGE-XR) 500 MG 24 hr tablet TAKE ONE (1) TABLET BY MOUTH EVERY DAY WITH BREAKFAST (OFFICE VISIT NEEDED FOR REFILLS) 30 tablet 0  . metoprolol (LOPRESSOR) 100 MG tablet TAKE ONE (1) TABLET BY MOUTH TWO (2) TIMES DAILY 180 tablet 3  . mometasone (NASONEX) 50 MCG/ACT nasal spray Place 2 sprays into the nose daily. 17 g 2  . nitroGLYCERIN (NITROSTAT) 0.4 MG SL tablet Place 1 tablet (0.4 mg total) under the tongue every 5 (five) minutes as needed. 25 tablet 3  . NONFORMULARY OR COMPOUNDED ITEM Allergy Vaccine 1:10 Given at [facility]    . pantoprazole (PROTONIX) 20 MG tablet Take 20 mg by mouth daily.    . sertraline (ZOLOFT) 100 MG tablet Take 2 by mouth once daily 180 tablet 0  . Trospium Chloride 60 MG CP24 TAKE ONE CAPSULE BY MOUTH DAILY 90 each 1  . warfarin (COUMADIN) 7.5 MG tablet TAKE ONE AND ONE-HALF (1 1/2) TO TWO (2)TABLETS BY MOUTH DAILY AS DIRECTED 165 tablet 1  . [DISCONTINUED] SANCTURA XR 60 MG CP24 Take 1 capsule by mouth daily.     No current facility-administered medications for this visit.    PAST MEDICAL HISTORY: Past Medical History  Diagnosis Date  . Allergic rhinitis   . Asthma   . CAD (coronary artery disease)   . Hypertension   . Sleep apnea   . Diabetes mellitus type II   . Depression   . PTSD (post-traumatic stress disorder)   . 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 Carpal tunnel syndrome, right   . Hyperlipidemia   . Paroxysmal a-fib ([place])   . CAD (coronary artery disease) 01/30/2007    stents trivial coronary artery disease diffusely, with a recent deployment od a intracoronary artery stent, 3.5 x 12 mm driver stent with no more than 20-30% in- stents restenosis.done by Dr [name] with re-look on novenber 10 2008 revealing a widely patent stent with otherwise trival CAD and normal LV function  . Chest pain 07/17/12009    2 D Echo  EF >55%  . Obesity   . Gait instability     PAST SURGICAL HISTORY: Past Surgical History  Procedure Laterality Date  . Cholecystectomy    . Left groin cyst    . Kidney stones    . Stress myocardial perfusion study  09/08/2010    normal; EF 75%  . Multilevel lumbar degenerative disc disease    . Hemorrhoid surgery    . Gallbladder surgery      FAMILY HISTORY: Family History  Problem Relation Age of Onset  . Coronary artery disease Mother   . Coronary artery disease Father   . Depression Father     SOCIAL HISTORY:  Social History   Social History  . Marital Status: Divorced    Spouse Name: N/A  . Number of Children: 2  . Years of Education: 14   Occupational History  . 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 Smoking status: Never Smoker   .  Smokeless tobacco: Never Used  . Alcohol Use: No  . Drug Use: No  . Sexual Activity: Not on file   Other Topics Concern  . Not on file   Social History Narrative   Lives at home alone.   Right-handed.   Occasional caffeine use.     PHYSICAL EXAM   Filed Vitals:   05/22/15 0907  BP: 122/80  Pulse: 91  Height: 5\' 2"  (1.575 m)  Weight: 284 lb (128.822 kg)    Not recorded      Body mass index is 51.93 kg/(m^2).  PHYSICAL EXAMNIATION:  Gen: NAD, conversant, well nourised, obese, well groomed                     Cardiovascular: Regular rate rhythm, no peripheral edema, warm, nontender. Eyes: Conjunctivae clear without exudates or hemorrhage Neck: Supple, no carotid bruise. Pulmonary: Clear to auscultation bilaterally   NEUROLOGICAL EXAM:  MENTAL STATUS: Speech:    Speech is normal; fluent and spontaneous with normal comprehension.  Cognition: Depressed looking middle-age female      Orientation to time, place and person     Normal recent and remote memory     Normal Attention span and concentration     Normal Language, naming, repeating,spontaneous speech     Fund of knowledge     CRANIAL NERVES: CN II: Visual fields are full to confrontation. Pupils are round equal and briskly reactive to light. CN III, IV, VI: extraocular movement are normal. No ptosis. CN V: Facial sensation is intact to pinprick in all 3 divisions bilaterally. Corneal responses are intact.  CN VII: Face is symmetric with normal eye closure and smile. CN VIII: Hearing is normal to rubbing fingers CN IX, X: Palate elevates symmetrically. Phonation is normal. CN XI: Head turning and shoulder shrug are intact CN XII: Tongue is midline with normal movements and no atrophy.  MOTOR: Obesity, mild right hip flexion, ankle dorsiflexion weakness   REFLEXES: Reflexes are 3 and symmetric at the biceps, triceps, knees, and ankles. Plantar responses are extensor bilaterally  Sensory: Length dependent decreased to light touch, pinprick and vibration sense to distal shin.   COORDINATION: Rapid alternating movements and fine finger movements are intact. There is no dysmetria on finger-to-nose and heel-knee-shin.    GAIT/STANCE: She need assistant to get up from seated position, cautious, mildly unsteady, dragging right leg more   DIAGNOSTIC DATA (LABS, IMAGING, TESTING) - I reviewed patient records, labs, notes, testing and imaging myself where available.   ASSESSMENT AND PLAN  Michele [name] is a 64 y.o. female    Subacute left internal capsule stroke  Small vessel acute stroke  She has a history of paroxysmal atrial fibrillation, already taking Coumadin  Continue anticoagulation treatment  May consider ultrasound of carotid artery, echocardiogram  Continue to address vascular risk factor by her primary care physician  Gait difficulty, falling episodes  Multifactorial, this includes obesity, bilateral knee, leg pain, cervical spondylitic myelopathy, extensive periventricular white matter disease, left internal capsule  stroke with mild right-sided weakness   Continue physical therapy,  moderate exercise      Marcial Pacas, M.D. Ph.D.  [facility] [address] [place], [place] [address] Ph: [phone] Fax: [phone]  CC: [name], MD

## 2015-05-22 NOTE — Telephone Encounter (Signed)
Spoke with pt she is felling good and stated that BP has been better.  She could not find her log but that today it was "120 something over 80".  Pt reminded of our office number if she has any questions. Verbalized understanding no questions at this time.

## 2015-05-29 ENCOUNTER — Ambulatory Visit (INDEPENDENT_AMBULATORY_CARE_PROVIDER_SITE_OTHER): Payer: Medicare Other

## 2015-05-29 DIAGNOSIS — J309 Allergic rhinitis, unspecified: Secondary | ICD-10-CM

## 2015-06-02 ENCOUNTER — Ambulatory Visit (INDEPENDENT_AMBULATORY_CARE_PROVIDER_SITE_OTHER): Payer: Medicare Other | Admitting: Pharmacist Clinician (PhC)/ Clinical Pharmacy Specialist

## 2015-06-02 DIAGNOSIS — Z7901 Long term (current) use of anticoagulants: Secondary | ICD-10-CM | POA: Diagnosis not present

## 2015-06-02 DIAGNOSIS — I480 Paroxysmal atrial fibrillation: Secondary | ICD-10-CM

## 2015-06-02 LAB — POCT INR: INR: 2.6

## 2015-06-03 ENCOUNTER — Ambulatory Visit: Payer: Medicare Other | Admitting: Neurology

## 2015-06-05 ENCOUNTER — Ambulatory Visit (INDEPENDENT_AMBULATORY_CARE_PROVIDER_SITE_OTHER): Payer: Medicare Other

## 2015-06-05 ENCOUNTER — Ambulatory Visit (INDEPENDENT_AMBULATORY_CARE_PROVIDER_SITE_OTHER): Payer: No Typology Code available for payment source | Admitting: Psychology

## 2015-06-05 DIAGNOSIS — F4310 Post-traumatic stress disorder, unspecified: Secondary | ICD-10-CM

## 2015-06-05 DIAGNOSIS — J309 Allergic rhinitis, unspecified: Secondary | ICD-10-CM

## 2015-06-05 DIAGNOSIS — F331 Major depressive disorder, recurrent, moderate: Secondary | ICD-10-CM | POA: Diagnosis not present

## 2015-06-05 NOTE — Progress Notes (Signed)
   THERAPIST PROGRESS NOTE  Session Time: 1.20pm-2.10pm  Participation Level: Active  Behavioral Response: Well GroomedAlert, aFFECT WNL  Type of Therapy: Individual Therapy  Treatment Goals addressed: Diagnosis: MDD, PtSD and goal 1.  Interventions: CBT and Supportive  Summary: LENNETTE FADER is a 64 y.o. female who presents with affect congruent w/ mood.  Pt reported that she has been busy w/ appointments of physical therapy and speech therapy 2 times a week. Pt reported that her MRI results did show symptoms congruent w/ having small strokes.  Pt reported that she is struggling w/ short term memory and spelling- but is focused on activities that will strengthen.  Pt does feel encouraged as she is seeing progress w/ her physical therapy.  Pt is excited about her upcoming trip to [place] to visit family.  Pt feels that this will be good for "quenching her thirst for home" and connecting w/ family and friends in that area.     Suicidal/Homicidal: Nowithout intent/plan  Therapist Response: Assessed pt current functioning per pt report.  Explored w/pt effects of stroke and related therapies.  Reflected to pt progress she is making and activities she is doing to strength herself physically, mentally and emotionally.  Processed w/ pt visiting home and how to transition back w/ positives from the trip.   Plan: Return again in 2-3 weeks.  Diagnosis: MDD, PTSD    [name],[name], [place] 06/05/2015

## 2015-06-09 ENCOUNTER — Ambulatory Visit (INDEPENDENT_AMBULATORY_CARE_PROVIDER_SITE_OTHER): Payer: Medicare Other | Admitting: Pharmacist Clinician (PhC)/ Clinical Pharmacy Specialist

## 2015-06-09 DIAGNOSIS — I480 Paroxysmal atrial fibrillation: Secondary | ICD-10-CM

## 2015-06-09 DIAGNOSIS — Z7901 Long term (current) use of anticoagulants: Secondary | ICD-10-CM | POA: Diagnosis not present

## 2015-06-09 LAB — POCT INR: INR: 4.3

## 2015-06-19 ENCOUNTER — Ambulatory Visit: Payer: Self-pay

## 2015-06-23 ENCOUNTER — Ambulatory Visit (INDEPENDENT_AMBULATORY_CARE_PROVIDER_SITE_OTHER): Payer: Medicare Other | Admitting: Pharmacist Clinician (PhC)/ Clinical Pharmacy Specialist

## 2015-06-23 ENCOUNTER — Telehealth: Payer: Self-pay

## 2015-06-23 DIAGNOSIS — I480 Paroxysmal atrial fibrillation: Secondary | ICD-10-CM | POA: Diagnosis not present

## 2015-06-23 DIAGNOSIS — Z7901 Long term (current) use of anticoagulants: Secondary | ICD-10-CM

## 2015-06-23 LAB — POCT INR: INR: 4.8

## 2015-06-23 NOTE — Telephone Encounter (Signed)
Needs verbal order for home care,,   Best phone  [phone]

## 2015-06-24 NOTE — Telephone Encounter (Signed)
Okay to give verbal order for home care.

## 2015-06-24 NOTE — Telephone Encounter (Signed)
Wants to see patient 2 times wk six weeks OT,  2 times week for home health care.   [phone]   [address] Dr.

## 2015-06-25 NOTE — Telephone Encounter (Signed)
Please have physical therapy send me a note and I can extend her PT treatment.

## 2015-06-25 NOTE — Telephone Encounter (Signed)
Daub   Daughter, Nonie Hoyer requesting extension for PT for her mom.   [phone]

## 2015-06-26 ENCOUNTER — Ambulatory Visit (INDEPENDENT_AMBULATORY_CARE_PROVIDER_SITE_OTHER): Payer: Medicare Other

## 2015-06-26 DIAGNOSIS — J309 Allergic rhinitis, unspecified: Secondary | ICD-10-CM | POA: Diagnosis not present

## 2015-06-26 NOTE — Telephone Encounter (Signed)
Called and left a voicemail with verbal orders.  

## 2015-06-27 ENCOUNTER — Telehealth: Payer: Self-pay

## 2015-06-27 NOTE — Telephone Encounter (Signed)
[facility] rep, Michele [name], called to ask for a new order/extension for physical therapy for 2 times a week for three weeks for the patient.  Please advise, thank you.  CB#: [phone]

## 2015-06-28 NOTE — Telephone Encounter (Signed)
Please continue physical therapy. I will be happy to sign the forms.

## 2015-06-30 NOTE — Telephone Encounter (Signed)
Left message on voicemail advising verbal order.

## 2015-07-01 ENCOUNTER — Ambulatory Visit (INDEPENDENT_AMBULATORY_CARE_PROVIDER_SITE_OTHER): Payer: Medicare Other | Admitting: Emergency Medicine

## 2015-07-01 ENCOUNTER — Encounter: Payer: Self-pay | Admitting: Emergency Medicine

## 2015-07-01 VITALS — BP 100/68 | HR 65 | Temp 98.2°F | Resp 18 | Ht 62.0 in | Wt 291.0 lb

## 2015-07-01 DIAGNOSIS — D649 Anemia, unspecified: Secondary | ICD-10-CM

## 2015-07-01 DIAGNOSIS — F329 Major depressive disorder, single episode, unspecified: Secondary | ICD-10-CM

## 2015-07-01 DIAGNOSIS — E119 Type 2 diabetes mellitus without complications: Secondary | ICD-10-CM | POA: Diagnosis not present

## 2015-07-01 DIAGNOSIS — R269 Unspecified abnormalities of gait and mobility: Secondary | ICD-10-CM

## 2015-07-01 DIAGNOSIS — F32A Depression, unspecified: Secondary | ICD-10-CM

## 2015-07-01 LAB — GLUCOSE, POCT (MANUAL RESULT ENTRY): POC GLUCOSE: 98 mg/dL (ref 70–99)

## 2015-07-01 LAB — POCT GLYCOSYLATED HEMOGLOBIN (HGB A1C): Hemoglobin A1C: 6.2

## 2015-07-01 LAB — MICROALBUMIN, URINE

## 2015-07-01 NOTE — Progress Notes (Addendum)
Patient ID: Michele [name], female   DOB: 1951-05-17, 64 y.o.   MRN: [id number]    By signing my name below, I, Michele [name], attest that this documentation has been prepared under the direction and in the presence of [name], MD Electronically Signed: Ladene Artist, ED Scribe 07/01/2015 at 11:01 AM.  Chief Complaint:  Chief Complaint  Patient presents with  . Follow-up    Diabetes/ A1C was high last time   HPI: Michele [name] is a 64 y.o. female, with a h/o DM, HTN and depression, who reports to [facility] today for a follow-up regarding DM. Pt states that she has gained a little weight over the holidays.   Fall Pt reports a fall in her kitchen since her last office visit. Pt is being seen by PT and OT. She currently ambulates with a cane.   Depression Pt states that her depression is doing well. She is still following up with her psychiatrist.   Preventative Maintenance Pt is seen by her eye doctor annually. Her last colonoscopy was in 2009. Her last mammogram was last year; she plans to schedule an appointment for this year.   Immunizations Pt has had her flu vaccine and both pneumonia vaccines; pneumovax in 07/2009 and prevnar in 10/2014. She plans to get her shingles vaccine.   Past Medical History  Diagnosis Date  . Allergic rhinitis   . Asthma   . CAD (coronary artery disease)   . Hypertension   . Sleep apnea   . Diabetes mellitus type II   . Depression   . PTSD (post-traumatic stress disorder)   . 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 Carpal tunnel syndrome, right   . Hyperlipidemia   . Paroxysmal a-fib ([place])   . CAD (coronary artery disease) 01/30/2007    stents trivial coronary artery disease diffusely, with a recent deployment od a intracoronary artery stent, 3.5 x 12 mm driver stent with no more than 20-30% in- stents restenosis.done by Dr [name] with re-look on novenber 10 2008 revealing a widely patent stent with otherwise trival CAD and  normal LV function  . Chest pain 07/17/12009    2 D Echo EF >55%  . Obesity   . Gait instability    Past Surgical History  Procedure Laterality Date  . Cholecystectomy    . Left groin cyst    . Kidney stones    . Stress myocardial perfusion study  09/08/2010    normal; EF 75%  . Multilevel lumbar degenerative disc disease    . Hemorrhoid surgery    . Gallbladder surgery     Social History   Social History  . Marital Status: Divorced    Spouse Name: N/A  . Number of Children: 2  . Years of Education: 14   Occupational History  . 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 Smoking status: Never Smoker   . Smokeless tobacco: Never Used  . Alcohol Use: No  . Drug Use: No  . Sexual Activity: Not Asked   Other Topics Concern  . None   Social History Narrative   Lives at home alone.   Right-handed.   Occasional caffeine use.   Family History  Problem Relation Age of Onset  . Coronary artery disease Mother   . Coronary artery disease Father   . Depression Father    Allergies  Allergen Reactions  . Meprobamate Nausea And Vomiting  Prior to Admission medications   Medication Sig Start Date End Date Taking? Authorizing Provider  ADVAIR DISKUS 250-50 MCG/DOSE AEPB INHALE ONE PUFF TWICE DAILY 01/07/15  Yes Deneise Lever, MD  albuterol (PROVENTIL) (2.5 MG/3ML) 0.083% nebulizer solution Take 3 mLs (2.5 mg total) by nebulization every 6 (six) hours as needed. 10/25/14 04/20/17 Yes Clinton D Young, MD  ARIPiprazole (ABILIFY) 10 MG tablet Take 1 tablet (10 mg total) by mouth daily. 05/19/15  Yes Kathlee Nations, MD  aspirin 81 MG tablet Take 81 mg by mouth daily.     Yes Historical Provider, MD  benztropine (COGENTIN) 0.5 MG tablet Take 1 tablet (0.5 mg total) by mouth at bedtime. 05/19/15  Yes Kathlee Nations, MD  CRESTOR 10 MG tablet TAKE ONE (1) TABLET BY MOUTH EVERY OTHER DAY (NEED OFFICE VISIT FOR ADDITIONAL REFILLS) 04/16/15  Yes Mancel Bale, PA-C  diltiazem  (CARDIZEM CD) 240 MG 24 hr capsule TAKE ONE CAPSULE BY MOUTH DAILY 04/02/15  Yes Lorretta Harp, MD  fexofenadine (ALLEGRA) 60 MG tablet Take 1 tablet (60 mg total) by mouth daily. Take 60 mg by mouth daily. 12/16/10  Yes Deneise Lever, MD  furosemide (LASIX) 40 MG tablet TAKE ONE (1) TABLET BY MOUTH EVERY DAY 08/15/14  Yes Lorretta Harp, MD  HYDROcodone-acetaminophen [facility]) 10-325 MG per tablet Take 1 tablet by mouth every 6 (six) hours as needed. 09/19/14  Yes Historical Provider, MD  irbesartan-hydrochlorothiazide (AVALIDE) 150-12.5 MG tablet TAKE ONE (1) TABLET BY MOUTH EVERY DAY 05/08/15  Yes Lorretta Harp, MD  isosorbide mononitrate (IMDUR) 30 MG 24 hr tablet TAKE ONE (1) TABLET BY MOUTH EVERY DAY 05/12/15  Yes Lorretta Harp, MD  metFORMIN (GLUCOPHAGE-XR) 500 MG 24 hr tablet TAKE ONE (1) TABLET BY MOUTH EVERY DAY WITH BREAKFAST (OFFICE VISIT NEEDED FOR REFILLS) 05/08/15  Yes Mancel Bale, PA-C  metoprolol (LOPRESSOR) 100 MG tablet TAKE ONE (1) TABLET BY MOUTH TWO (2) TIMES DAILY 10/10/14  Yes Lorretta Harp, MD  mometasone (NASONEX) 50 MCG/ACT nasal spray Place 2 sprays into the nose daily. 10/23/12  Yes Deneise Lever, MD  nitroGLYCERIN (NITROSTAT) 0.4 MG SL tablet Place 1 tablet (0.4 mg total) under the tongue every 5 (five) minutes as needed. 05/12/15  Yes Lorretta Harp, MD  NONFORMULARY OR COMPOUNDED ITEM Allergy Vaccine 1:10 Given at [facility]   Yes Historical Provider, MD  pantoprazole (PROTONIX) 20 MG tablet Take 20 mg by mouth daily.   Yes Historical Provider, MD  sertraline (ZOLOFT) 100 MG tablet Take 2 by mouth once daily 05/19/15  Yes Kathlee Nations, MD  Trospium Chloride 60 MG CP24 TAKE ONE CAPSULE BY MOUTH DAILY 11/06/13  Yes Collene Leyden, PA-C  warfarin (COUMADIN) 7.5 MG tablet TAKE ONE AND ONE-HALF (1 1/2) TO TWO (2)TABLETS BY MOUTH DAILY AS DIRECTED 05/12/15  Yes Lorretta Harp, MD    ROS: The patient denies fevers, chills, night sweats, unintentional  weight loss, chest pain, palpitations, wheezing, dyspnea on exertion, nausea, vomiting, abdominal pain, dysuria, hematuria, melena, numbness, weakness, or tingling.   All other systems have been reviewed and were otherwise negative with the exception of those mentioned in the HPI and as above.    PHYSICAL EXAM: Filed Vitals:   07/01/15 1038 07/01/15 1043  BP: 103/52 100/68  Pulse: 65   Temp: 98.2 F (36.8 C)   Resp: 18    Body mass index is 53.21 kg/(m^2).   General: Alert, no  acute distress. Morbidly obese.  HEENT:  Normocephalic, atraumatic, oropharynx patent. Eye: Juliette M[facility] Cardiovascular:  Regular rate and rhythm, no murmurs, rubs or gallops. No Carotid bruits, radial pulse intact. No pedal edema.  Respiratory: Clear to auscultation bilaterally. No wheezes, rales, or rhonchi. No cyanosis, no use of accessory musculature Abdominal: No organomegaly, abdomen is soft and non-tender, positive bowel sounds. No masses. Musculoskeletal: Gait intact. Trace edema. Arthritic changes in both knees.  Skin: No rashes. Neurologic: Facial musculature symmetric. Psychiatric: Patient acts appropriately throughout our interaction. Lymphatic: No cervical or submandibular lymphadenopathy  LABS: Results for orders placed or performed in visit on 07/01/15  POCT glucose (manual entry)  Result Value Ref Range   POC Glucose 98 70 - 99 mg/dl  POCT glycosylated hemoglobin (Hb A1C)  Result Value Ref Range   Hemoglobin A1C 6.2    *Note: Due to a large number of results and/or encounters for the requested time period, some results have not been displayed. A complete set of results can be found in Results Review.   EKG/XRAY:   Primary read interpreted by Dr. [name] at [facility]  ASSESSMENT/PLAN: Hemoglobin A1c is excellent at 6.2 Patient is working with occupational therapy and physical therapy to improve her coordination and gait stability. She continues to have some weakness in her legs. Her  depression has been much improved recently. Overall she feels she is doing well except has not been adhering to her diet and is gaining weight.. Microalbumin done today Recheck 4 months.I personally performed the services described in this documentation, which was scribed in my presence. The recorded information has been reviewed and is accurate.    Gross sideeffects, risk and benefits, and alternatives of medications d/w patient. Patient is aware that all medications have potential sideeffects and we are unable to predict every sideeffect or drug-drug interaction that may occur.  [name] MD 07/01/2015 11:01 AM

## 2015-07-03 ENCOUNTER — Ambulatory Visit (INDEPENDENT_AMBULATORY_CARE_PROVIDER_SITE_OTHER): Payer: No Typology Code available for payment source | Admitting: Psychology

## 2015-07-03 ENCOUNTER — Ambulatory Visit (INDEPENDENT_AMBULATORY_CARE_PROVIDER_SITE_OTHER): Payer: Medicare Other

## 2015-07-03 DIAGNOSIS — F4310 Post-traumatic stress disorder, unspecified: Secondary | ICD-10-CM

## 2015-07-03 DIAGNOSIS — J309 Allergic rhinitis, unspecified: Secondary | ICD-10-CM

## 2015-07-03 DIAGNOSIS — F331 Major depressive disorder, recurrent, moderate: Secondary | ICD-10-CM

## 2015-07-03 NOTE — Progress Notes (Signed)
   THERAPIST PROGRESS NOTE  Session Time: 1.33pm-2.16pm  Participation Level: Active  Behavioral Response: Well GroomedAlertAffect WNL  Type of Therapy: Individual Therapy  Treatment Goals addressed: Diagnosis: MDD, PTSD and goal 1.  Interventions: CBT and Supportive  Summary: Michele [name] is a 64 y.o. female who presents with generally full and bright affect.  Pt reported that she has been busy w/ her PT, OT and Speech Therapy.  Pt reported that she is finding this very beneficial and really enjoys that they come to her.  Pt reported that she is doing ok with her mood. Pt reported that she really enjoyed her visit to family/home to [place].  Pt reported that she is missing them after being home- but not going into depressed moods.  Pt shares positive self talk and outlook for self.    Suicidal/Homicidal: Nowithout intent/plan  Therapist Response: Assessed pt current functioning per pt report.  Processed w/pt her interactions w/ family and friends and recovery from mini strokes and weakness.  Reflected to pt positive self talk and compassion towards self despite challenges and how this is helping her cope.    Plan: Return again in 2 weeks.  Diagnosis: MDD, PTSD    Khristian Seals, [place] 07/03/2015

## 2015-07-07 ENCOUNTER — Ambulatory Visit (INDEPENDENT_AMBULATORY_CARE_PROVIDER_SITE_OTHER): Payer: Medicare Other | Admitting: Pharmacist Clinician (PhC)/ Clinical Pharmacy Specialist

## 2015-07-07 DIAGNOSIS — I480 Paroxysmal atrial fibrillation: Secondary | ICD-10-CM

## 2015-07-07 DIAGNOSIS — Z7901 Long term (current) use of anticoagulants: Secondary | ICD-10-CM | POA: Diagnosis not present

## 2015-07-07 LAB — POCT INR: INR: 6.2

## 2015-07-10 ENCOUNTER — Telehealth: Payer: Self-pay | Admitting: Family Medicine

## 2015-07-10 ENCOUNTER — Ambulatory Visit (INDEPENDENT_AMBULATORY_CARE_PROVIDER_SITE_OTHER): Payer: Medicare Other

## 2015-07-10 DIAGNOSIS — J309 Allergic rhinitis, unspecified: Secondary | ICD-10-CM | POA: Diagnosis not present

## 2015-07-10 NOTE — Telephone Encounter (Signed)
Faxed signed paperwork to [facility] for home health certification and plan of care on 07/10/15

## 2015-07-16 ENCOUNTER — Ambulatory Visit (INDEPENDENT_AMBULATORY_CARE_PROVIDER_SITE_OTHER): Payer: Medicare Other | Admitting: Pharmacist Clinician (PhC)/ Clinical Pharmacy Specialist

## 2015-07-16 DIAGNOSIS — Z7901 Long term (current) use of anticoagulants: Secondary | ICD-10-CM | POA: Diagnosis not present

## 2015-07-16 DIAGNOSIS — I480 Paroxysmal atrial fibrillation: Secondary | ICD-10-CM | POA: Diagnosis not present

## 2015-07-16 LAB — POCT INR: INR: 2.5

## 2015-07-17 ENCOUNTER — Ambulatory Visit (INDEPENDENT_AMBULATORY_CARE_PROVIDER_SITE_OTHER): Payer: Medicare Other

## 2015-07-17 DIAGNOSIS — J309 Allergic rhinitis, unspecified: Secondary | ICD-10-CM | POA: Diagnosis not present

## 2015-07-24 ENCOUNTER — Ambulatory Visit (INDEPENDENT_AMBULATORY_CARE_PROVIDER_SITE_OTHER): Payer: Medicare Other

## 2015-07-24 DIAGNOSIS — J309 Allergic rhinitis, unspecified: Secondary | ICD-10-CM

## 2015-07-29 ENCOUNTER — Other Ambulatory Visit: Payer: Self-pay | Admitting: *Deleted

## 2015-07-29 ENCOUNTER — Telehealth: Payer: Self-pay

## 2015-07-29 DIAGNOSIS — D509 Iron deficiency anemia, unspecified: Secondary | ICD-10-CM

## 2015-07-29 NOTE — Telephone Encounter (Signed)
Juliann Pulse from [place] [facility] called requesting extension of speech therapy orders:  Please extend beginning 07/29/15 for 1x for one week, 3x for three weeks and 2x for one week; where x= times per week..  Please call with verbal authorization at [phone]

## 2015-07-30 ENCOUNTER — Other Ambulatory Visit (HOSPITAL_BASED_OUTPATIENT_CLINIC_OR_DEPARTMENT_OTHER): Payer: Medicare Other

## 2015-07-30 DIAGNOSIS — D509 Iron deficiency anemia, unspecified: Secondary | ICD-10-CM | POA: Diagnosis not present

## 2015-07-30 LAB — CBC WITH DIFFERENTIAL/PLATELET
BASO%: 0.1 % (ref 0.0–2.0)
BASOS ABS: 0 10*3/uL (ref 0.0–0.1)
EOS%: 4.7 % (ref 0.0–7.0)
Eosinophils Absolute: 0.3 10*3/uL (ref 0.0–0.5)
HEMATOCRIT: 37.5 % (ref 34.8–46.6)
HGB: 11.5 g/dL — ABNORMAL LOW (ref 11.6–15.9)
LYMPH#: 2.4 10*3/uL (ref 0.9–3.3)
LYMPH%: 35.5 % (ref 14.0–49.7)
MCH: 21.2 pg — AB (ref 25.1–34.0)
MCHC: 30.7 g/dL — AB (ref 31.5–36.0)
MCV: 69.1 fL — ABNORMAL LOW (ref 79.5–101.0)
MONO#: 0.5 10*3/uL (ref 0.1–0.9)
MONO%: 8 % (ref 0.0–14.0)
NEUT#: 3.5 10*3/uL (ref 1.5–6.5)
NEUT%: 51.7 % (ref 38.4–76.8)
PLATELETS: 335 10*3/uL (ref 145–400)
RBC: 5.42 10*6/uL (ref 3.70–5.45)
RDW: 18.4 % — ABNORMAL HIGH (ref 11.2–14.5)
WBC: 6.8 10*3/uL (ref 3.9–10.3)

## 2015-07-30 LAB — IRON AND TIBC
%SAT: 22 % (ref 21–57)
IRON: 67 ug/dL (ref 41–142)
TIBC: 305 ug/dL (ref 236–444)
UIBC: 238 ug/dL (ref 120–384)

## 2015-07-30 LAB — FERRITIN: FERRITIN: 782 ng/mL — AB (ref 9–269)

## 2015-07-30 NOTE — Telephone Encounter (Signed)
Left detailed verbal orders for patient on voicemail.  

## 2015-07-30 NOTE — Telephone Encounter (Signed)
Please call and give verbal auth

## 2015-07-31 ENCOUNTER — Ambulatory Visit (INDEPENDENT_AMBULATORY_CARE_PROVIDER_SITE_OTHER): Payer: Medicare Other

## 2015-07-31 ENCOUNTER — Other Ambulatory Visit (INDEPENDENT_AMBULATORY_CARE_PROVIDER_SITE_OTHER): Payer: Medicare Other

## 2015-07-31 ENCOUNTER — Encounter: Payer: Self-pay | Admitting: Internal Medicine

## 2015-07-31 ENCOUNTER — Ambulatory Visit (INDEPENDENT_AMBULATORY_CARE_PROVIDER_SITE_OTHER): Payer: Medicare Other | Admitting: Internal Medicine

## 2015-07-31 VITALS — BP 126/68 | HR 61 | Ht 62.0 in | Wt 288.4 lb

## 2015-07-31 DIAGNOSIS — R0609 Other forms of dyspnea: Secondary | ICD-10-CM | POA: Diagnosis not present

## 2015-07-31 DIAGNOSIS — R0600 Dyspnea, unspecified: Secondary | ICD-10-CM | POA: Insufficient documentation

## 2015-07-31 DIAGNOSIS — J309 Allergic rhinitis, unspecified: Secondary | ICD-10-CM

## 2015-07-31 DIAGNOSIS — G4733 Obstructive sleep apnea (adult) (pediatric): Secondary | ICD-10-CM

## 2015-07-31 DIAGNOSIS — J3089 Other allergic rhinitis: Secondary | ICD-10-CM

## 2015-07-31 DIAGNOSIS — J302 Other seasonal allergic rhinitis: Secondary | ICD-10-CM

## 2015-07-31 LAB — BASIC METABOLIC PANEL
BUN: 21 mg/dL (ref 6–23)
CHLORIDE: 97 meq/L (ref 96–112)
CO2: 32 meq/L (ref 19–32)
Calcium: 10.2 mg/dL (ref 8.4–10.5)
Creatinine, Ser: 0.83 mg/dL (ref 0.40–1.20)
GFR: 88.88 mL/min (ref >60.00)
GLUCOSE: 84 mg/dL (ref 70–99)
POTASSIUM: 3.9 meq/L (ref 3.5–5.1)
SODIUM: 139 meq/L (ref 135–145)

## 2015-07-31 NOTE — Patient Instructions (Addendum)
Order- D-dimer, BMET      Dx dyspnea on exertion  Be safe going home.

## 2015-07-31 NOTE — Assessment & Plan Note (Signed)
Acute issue she noticed as she walked from car into our office, coming for allergy shot. Unremarkable exam with clear chest, normal pulse, unlabored breathing and normal oxygenation at rest. Saturation did not fall significantly as she was walked with monitor from waiting room to exam room. Can't exclude a very early viral syndrome/URI but suspect her anxiety and depression are more significant. She gets no regular exercise and is morbidly obese. Plan-lab for basic chemistry and d-dimer. Reassurance. Family are coming to help her get home.
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
## 2015-07-31 NOTE — Assessment & Plan Note (Signed)
She continues allergy vaccine without problems. We will adjust supplemental antihistamines and nasal sprays as needed with spring pollen season.

## 2015-07-31 NOTE — Assessment & Plan Note (Signed)
Noncompliant with therapy efforts. Poor insight. Depression contributes to management difficulty.

## 2015-07-31 NOTE — Assessment & Plan Note (Signed)
No effort at lifestyle change to lose weight despite counseling

## 2015-08-01 ENCOUNTER — Ambulatory Visit (INDEPENDENT_AMBULATORY_CARE_PROVIDER_SITE_OTHER): Payer: Medicare Other | Admitting: Pharmacist Clinician (PhC)/ Clinical Pharmacy Specialist

## 2015-08-01 DIAGNOSIS — I480 Paroxysmal atrial fibrillation: Secondary | ICD-10-CM

## 2015-08-01 DIAGNOSIS — Z7901 Long term (current) use of anticoagulants: Secondary | ICD-10-CM | POA: Diagnosis not present

## 2015-08-01 LAB — POCT INR: INR: 2

## 2015-08-01 LAB — D-DIMER, QUANTITATIVE (NOT AT ARMC): D DIMER QUANT: 0.27 ug{FEU}/mL (ref 0.00–0.48)

## 2015-08-04 ENCOUNTER — Telehealth: Payer: Self-pay | Admitting: Internal Medicine

## 2015-08-04 ENCOUNTER — Other Ambulatory Visit: Payer: Self-pay

## 2015-08-04 ENCOUNTER — Encounter: Payer: Self-pay | Admitting: Internal Medicine

## 2015-08-04 ENCOUNTER — Ambulatory Visit (HOSPITAL_BASED_OUTPATIENT_CLINIC_OR_DEPARTMENT_OTHER): Payer: Medicare Other | Admitting: Internal Medicine

## 2015-08-04 VITALS — BP 127/55 | HR 66 | Temp 98.5°F | Resp 21 | Ht 62.0 in | Wt 290.8 lb

## 2015-08-04 DIAGNOSIS — D509 Iron deficiency anemia, unspecified: Secondary | ICD-10-CM

## 2015-08-04 LAB — HM MAMMOGRAPHY

## 2015-08-04 NOTE — Telephone Encounter (Signed)
Talked to patient here in office. Scheduled appt.       [facility].

## 2015-08-04 NOTE — Progress Notes (Signed)
Michele [name] [phone]   [phone]  OFFICE PROGRESS NOTE  [name], MD [address]  [place] [id number]  DIAGNOSIS: Unspecified anemia questionable for anemia of chronic disease/iron deficiency .   PRIOR THERAPY: None   CURRENT THERAPY: Over-the-counter oral iron tablets. 1 tablets daily 2-3 times a week.  INTERVAL HISTORY: Michele [name] 65 y.o. female returns to the clinic today for three-month followup visit. The patient is feeling fine today with no specific complaints except for arthralgia and pain in the back and knees. She is currently receiving steroid injection by her orthopedic surgeon. She is tolerating the oral iron tablets well.. She denied having any bleeding, bruises or ecchymosis. The patient denied having any significant chest pain, shortness of breath, cough or hemoptysis. She denied having any significant weight loss or night sweats. She had repeat CBC and iron study performed recently and she is here for evaluation and discussion of her lab results.   MEDICAL HISTORY: Past Medical History  Diagnosis Date  . Allergic rhinitis   . Asthma   . CAD (coronary artery disease)   . Hypertension   . Sleep apnea   . Diabetes mellitus type II   . Depression   . PTSD (post-traumatic stress disorder)   . 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 Carpal tunnel syndrome, right   . Hyperlipidemia   . Paroxysmal a-fib ([place])   . CAD (coronary artery disease) 01/30/2007    stents trivial coronary artery disease diffusely, with a recent deployment od a intracoronary artery stent, 3.5 x 12 mm driver stent with no more than 20-30% in- stents restenosis.done by Dr [name] with re-look on novenber 10 2008 revealing a widely patent stent with otherwise trival CAD and normal LV function  . Chest pain 07/17/12009    2 D Echo EF >55%  . Obesity   . Gait instability     ALLERGIES:  is allergic to  meprobamate.  MEDICATIONS:  Current Outpatient Prescriptions  Medication Sig Dispense Refill  . ADVAIR DISKUS 250-50 MCG/DOSE AEPB INHALE ONE PUFF TWICE DAILY 60 each 5  . ARIPiprazole (ABILIFY) 10 MG tablet Take 1 tablet (10 mg total) by mouth daily. 90 tablet 0  . aspirin 81 MG tablet Take 81 mg by mouth daily.      . benztropine (COGENTIN) 0.5 MG tablet Take 1 tablet (0.5 mg total) by mouth at bedtime. 90 tablet 0  . CRESTOR 10 MG tablet TAKE ONE (1) TABLET BY MOUTH EVERY OTHER DAY (NEED OFFICE VISIT FOR ADDITIONAL REFILLS) 30 tablet 0  . diltiazem (CARDIZEM CD) 240 MG 24 hr capsule TAKE ONE CAPSULE BY MOUTH DAILY 90 capsule 2  . fexofenadine (ALLEGRA) 60 MG tablet Take 1 tablet (60 mg total) by mouth daily. Take 60 mg by mouth daily. 30 tablet 11  . furosemide (LASIX) 40 MG tablet TAKE ONE (1) TABLET BY MOUTH EVERY DAY 30 tablet 9  . gabapentin (NEURONTIN) 300 MG capsule Take 300 mg by mouth at bedtime.  0  . HYDROcodone-acetaminophen (NORCO) 10-325 MG per tablet Take 1 tablet by mouth every 6 (six) hours as needed.  0  . irbesartan-hydrochlorothiazide (AVALIDE) 150-12.5 MG tablet TAKE ONE (1) TABLET BY MOUTH EVERY DAY 30 tablet 5  . isosorbide mononitrate (IMDUR) 30 MG 24 hr tablet TAKE ONE (1) TABLET BY MOUTH EVERY DAY 90 tablet 2  . metFORMIN (GLUCOPHAGE-XR) 500 MG 24 hr tablet TAKE ONE (1)  TABLET BY MOUTH EVERY DAY WITH BREAKFAST (OFFICE VISIT NEEDED FOR REFILLS) 30 tablet 0  . metoprolol (LOPRESSOR) 100 MG tablet TAKE ONE (1) TABLET BY MOUTH TWO (2) TIMES DAILY 180 tablet 3  . mometasone (NASONEX) 50 MCG/ACT nasal spray Place 2 sprays into the nose daily. 17 g 2  . NONFORMULARY OR COMPOUNDED ITEM Allergy Vaccine 1:10 Given at [facility]    . pantoprazole (PROTONIX) 20 MG tablet Take 20 mg by mouth daily.    . sertraline (ZOLOFT) 100 MG tablet Take 2 by mouth once daily 180 tablet 0  . Trospium Chloride 60 MG CP24 TAKE ONE CAPSULE BY MOUTH DAILY 90 each 1  . warfarin  (COUMADIN) 7.5 MG tablet TAKE ONE AND ONE-HALF (1 1/2) TO TWO (2)TABLETS BY MOUTH DAILY AS DIRECTED 165 tablet 1  . albuterol (PROVENTIL) (2.5 MG/3ML) 0.083% nebulizer solution Take 3 mLs (2.5 mg total) by nebulization every 6 (six) hours as needed. (Patient not taking: Reported on 08/04/2015) 360 mL 2  . nitroGLYCERIN (NITROSTAT) 0.4 MG SL tablet Place 1 tablet (0.4 mg total) under the tongue every 5 (five) minutes as needed. (Patient not taking: Reported on 08/04/2015) 25 tablet 3  . [DISCONTINUED] SANCTURA XR 60 MG CP24 Take 1 capsule by mouth daily.     No current facility-administered medications for this visit.    SURGICAL HISTORY:  Past Surgical History  Procedure Laterality Date  . Cholecystectomy    . Left groin cyst    . Kidney stones    . Stress myocardial perfusion study  09/08/2010    normal; EF 75%  . Multilevel lumbar degenerative disc disease    . Hemorrhoid surgery    . Gallbladder surgery      REVIEW OF SYSTEMS:  A comprehensive review of systems was negative except for: Musculoskeletal: positive for arthralgias and back pain   PHYSICAL EXAMINATION: General appearance: alert, cooperative, fatigued and no distress Head: Normocephalic, without obvious abnormality, atraumatic Neck: no adenopathy, no JVD, supple, symmetrical, trachea midline and thyroid not enlarged, symmetric, no tenderness/mass/nodules Lymph nodes: Cervical, supraclavicular, and axillary nodes normal. Resp: clear to auscultation bilaterally Back: symmetric, no curvature. ROM normal. No CVA tenderness. Cardio: regular rate and rhythm, S1, S2 normal, no murmur, click, rub or gallop GI: soft, non-tender; bowel sounds normal; no masses,  no organomegaly Extremities: extremities normal, atraumatic, no cyanosis or edema  ECOG PERFORMANCE STATUS: 1 - Symptomatic but completely ambulatory  Blood pressure 127/55, pulse 66, temperature 98.5 F (36.9 C), temperature source Oral, resp. rate 21, height 5\' 2"   (1.575 m), weight 290 lb 12.8 oz (131.906 kg), SpO2 99 %.  LABORATORY DATA: Lab Results  Component Value Date   WBC 6.8 07/30/2015   HGB 11.5* 07/30/2015   HCT 37.5 07/30/2015   MCV 69.1* 07/30/2015   PLT 335 07/30/2015      Chemistry      Component Value Date/Time   NA 139 07/31/2015 1305   NA 144 10/02/2012 0905   K 3.9 07/31/2015 1305   K 3.4* 10/02/2012 0905   CL 97 07/31/2015 1305   CL 101 10/02/2012 0905   CO2 32 07/31/2015 1305   CO2 31* 10/02/2012 0905   BUN 21 07/31/2015 1305   BUN 13.9 10/02/2012 0905   CREATININE 0.83 07/31/2015 1305   CREATININE 0.77 12/08/2014 1153   CREATININE 0.8 10/02/2012 0905      Component Value Date/Time   CALCIUM 10.2 07/31/2015 1305   CALCIUM 9.9 10/02/2012 0905   ALKPHOS 93  12/08/2014 1153   AST 18 12/08/2014 1153   ALT 19 12/08/2014 1153   BILITOT 0.6 12/08/2014 1153     Serum ferritin 782, serum iron 67, total iron binding capacity 305 and iron sat 22%. RADIOGRAPHIC STUDIES: No results found.  ASSESSMENT AND PLAN: This is a very pleasant 65 years old [place] American female with history of persistent microcytic anemia questionable for anemia of chronic disease plus/minus iron deficiency.  The previous hemoglobin electrophoresis showed normal pattern. She continues to have mild anemia but her hemoglobin and hematocrit are better than 3 months ago. Her iron study is within the normal range.  I recommended for the patient to continue with the oral iron tablets for now. She would come back for followup visit in 6 months with repeat CBC and iron study. She was advised to call immediately if she has any concerning symptoms in the interval.  The patient voices understanding of current disease status and treatment options and is in agreement with the current care plan.  All questions were answered. The patient knows to call the clinic with any problems, questions or concerns. We can certainly see the patient much sooner if  necessary.  Disclaimer: This note was dictated with voice recognition software. Similar sounding words can inadvertently be transcribed and may not be corrected upon review.

## 2015-08-04 NOTE — Telephone Encounter (Signed)
Notes Recorded by Deneise Lever, MD on 08/01/2015 at 2:34 PM Lab- D-dimer test was normal. No evidence of blood clot  Notes Recorded by Deneise Lever, MD on 07/31/2015 at 8:27 PM Lab- normal basic chemistry  I spoke with patient about results and she verbalized understanding and had no questions

## 2015-08-05 ENCOUNTER — Other Ambulatory Visit (HOSPITAL_COMMUNITY): Payer: Self-pay | Admitting: Psychiatry

## 2015-08-05 ENCOUNTER — Other Ambulatory Visit: Payer: Self-pay | Admitting: Physician Assistant

## 2015-08-05 ENCOUNTER — Other Ambulatory Visit: Payer: Self-pay | Admitting: Pharmacist Clinician (PhC)/ Clinical Pharmacy Specialist

## 2015-08-05 ENCOUNTER — Telehealth: Payer: Self-pay

## 2015-08-05 NOTE — Telephone Encounter (Signed)
Michele [name] with [facility] is wanting an extension of pt's physical therapy and possibly add behavior health evaluation   Pt extension needs to be 2 times every week for 4 weeks  Best number [phone]

## 2015-08-05 NOTE — Telephone Encounter (Signed)
Please continue PT 2 times a week for 4 weeks.

## 2015-08-05 NOTE — Telephone Encounter (Signed)
I believe she has a psychiatrist. I would check with her treating psychiatrist to see what the psychiatrist prefers.

## 2015-08-05 NOTE — Telephone Encounter (Signed)
Spoke with Michele [name], she would also like an order psych behavioral health nursing. Is this ok?

## 2015-08-06 ENCOUNTER — Telehealth: Payer: Self-pay

## 2015-08-06 NOTE — Telephone Encounter (Signed)
Dr. [name] any reason this has to be brand? Ok to send generic.

## 2015-08-06 NOTE — Telephone Encounter (Signed)
[facility] is calling to see if we can send crestor generic

## 2015-08-07 ENCOUNTER — Ambulatory Visit (INDEPENDENT_AMBULATORY_CARE_PROVIDER_SITE_OTHER): Payer: Medicare Other

## 2015-08-07 DIAGNOSIS — J309 Allergic rhinitis, unspecified: Secondary | ICD-10-CM

## 2015-08-07 MED ORDER — ROSUVASTATIN CALCIUM 10 MG PO TABS: 10.0000 mg | ORAL_TABLET | Freq: Every day | ORAL | Status: DC

## 2015-08-07 NOTE — Telephone Encounter (Signed)
Generic is fine 

## 2015-08-07 NOTE — Telephone Encounter (Signed)
Generic Crestor sent to pharmacy.

## 2015-08-07 NOTE — Telephone Encounter (Signed)
Michele [name].

## 2015-08-08 ENCOUNTER — Encounter: Payer: Self-pay | Admitting: Emergency Medicine

## 2015-08-08 ENCOUNTER — Ambulatory Visit (INDEPENDENT_AMBULATORY_CARE_PROVIDER_SITE_OTHER): Payer: Medicare Other | Admitting: Emergency Medicine

## 2015-08-08 DIAGNOSIS — R32 Unspecified urinary incontinence: Secondary | ICD-10-CM

## 2015-08-08 DIAGNOSIS — F32A Depression, unspecified: Secondary | ICD-10-CM

## 2015-08-08 DIAGNOSIS — F329 Major depressive disorder, single episode, unspecified: Secondary | ICD-10-CM

## 2015-08-08 DIAGNOSIS — E119 Type 2 diabetes mellitus without complications: Secondary | ICD-10-CM

## 2015-08-08 LAB — POCT GLYCOSYLATED HEMOGLOBIN (HGB A1C): HEMOGLOBIN A1C: 6.3

## 2015-08-08 LAB — GLUCOSE, POCT (MANUAL RESULT ENTRY): POC Glucose: 91 mg/dl (ref 70–99)

## 2015-08-08 MED ORDER — DILTIAZEM HCL ER COATED BEADS 180 MG PO CP24: 180.0000 mg | ORAL_CAPSULE | Freq: Every day | ORAL | Status: DC

## 2015-08-08 MED ORDER — IRBESARTAN 150 MG PO TABS: 150.0000 mg | ORAL_TABLET | Freq: Every day | ORAL | Status: DC

## 2015-08-08 NOTE — Progress Notes (Addendum)
By signing my name below, I, Moises Blood, attest that this documentation has been prepared under the direction and in the presence of [name], MD. Electronically Signed: Moises Blood, [place]. 08/08/2015 , 9:11 AM .  Patient was seen in room 21 .  Chief Complaint:  Chief Complaint  Patient presents with  . Follow-up  . Diabetes    HPI: Michele [name] is a 65 y.o. female who reports to [facility] today for follow up.   Neurologist She had MRI done recently by Dr. Krista Blue on 05/15/2015, that showed a small stroke in her internal capsule.  She saw another neurologist (Dr. Adele Schilder) for her urinary incontinence but was charged heavily for having not seen him for a while.   Orthopedic Her orthopedist is Dr. Nelva Bush.    Medication She is on a lot of medication and sometimes it makes her "loopy". She wants to see if she can be ween off some of the medications.   Weight She feels very fatigue and hasn't been able to exercise or lose weight.   BP Her BP has been dropping. She informs that her family had to pick her up from a doctor appointment due to her BP being too low.   Immunization She's had her flu vaccine this season.   Vision She needs a diabetic eye exam.   Personal She lives by herself. She has a therapist that comes to take care of her; however, the therapist may be stopping because they have made note of the situation of being "maintenance". Therefore, insurance would not cover it.   Past Medical History  Diagnosis Date  . Allergic rhinitis   . Asthma   . CAD (coronary artery disease)   . Hypertension   . Sleep apnea   . Diabetes mellitus type II   . Depression   . PTSD (post-traumatic stress disorder)   . 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 Carpal tunnel syndrome, right   . Hyperlipidemia   . Paroxysmal a-fib ([place])   . CAD (coronary artery disease) 01/30/2007    stents trivial coronary artery disease diffusely, with a recent deployment od a  intracoronary artery stent, 3.5 x 12 mm driver stent with no more than 20-30% in- stents restenosis.done by Dr [name] with re-look on novenber 10 2008 revealing a widely patent stent with otherwise trival CAD and normal LV function  . Chest pain 07/17/12009    2 D Echo EF >55%  . Obesity   . Gait instability    Past Surgical History  Procedure Laterality Date  . Cholecystectomy    . Left groin cyst    . Kidney stones    . Stress myocardial perfusion study  09/08/2010    normal; EF 75%  . Multilevel lumbar degenerative disc disease    . Hemorrhoid surgery    . Gallbladder surgery     Social History   Social History  . Marital Status: Divorced    Spouse Name: N/A  . Number of Children: 2  . Years of Education: 14   Occupational History  . 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 Smoking status: Never Smoker   . Smokeless tobacco: Never Used  . Alcohol Use: No  . Drug Use: No  . Sexual Activity: Not Asked   Other Topics Concern  . None   Social History Narrative   Lives at home alone.   Right-handed.  Occasional caffeine use.   Family History  Problem Relation Age of Onset  . Coronary artery disease Mother   . Coronary artery disease Father   . Depression Father    Allergies  Allergen Reactions  . Meprobamate Nausea And Vomiting   Prior to Admission medications   Medication Sig Start Date End Date Taking? Authorizing Provider  ADVAIR DISKUS 250-50 MCG/DOSE AEPB INHALE ONE PUFF TWICE DAILY 01/07/15   Deneise Lever, MD  albuterol (PROVENTIL) (2.5 MG/3ML) 0.083% nebulizer solution Take 3 mLs (2.5 mg total) by nebulization every 6 (six) hours as needed. Patient not taking: Reported on 08/04/2015 10/25/14 04/20/17  Deneise Lever, MD  ARIPiprazole (ABILIFY) 10 MG tablet Take 1 tablet (10 mg total) by mouth daily. 05/19/15   Kathlee Nations, MD  aspirin 81 MG tablet Take 81 mg by mouth daily.      Historical Provider, MD  benztropine (COGENTIN) 0.5  MG tablet Take 1 tablet (0.5 mg total) by mouth at bedtime. 05/19/15   Kathlee Nations, MD  diltiazem (CARDIZEM CD) 240 MG 24 hr capsule TAKE ONE CAPSULE BY MOUTH DAILY 04/02/15   Lorretta Harp, MD  fexofenadine (ALLEGRA) 60 MG tablet Take 1 tablet (60 mg total) by mouth daily. Take 60 mg by mouth daily. 12/16/10   Deneise Lever, MD  furosemide (LASIX) 40 MG tablet TAKE ONE (1) TABLET BY MOUTH EVERY DAY 08/15/14   Lorretta Harp, MD  gabapentin (NEURONTIN) 300 MG capsule Take 300 mg by mouth at bedtime. 07/04/15   Historical Provider, MD  HYDROcodone-acetaminophen (NORCO) 10-325 MG per tablet Take 1 tablet by mouth every 6 (six) hours as needed. 09/19/14   Historical Provider, MD  irbesartan-hydrochlorothiazide (AVALIDE) 150-12.5 MG tablet TAKE ONE (1) TABLET BY MOUTH EVERY DAY 05/08/15   Lorretta Harp, MD  isosorbide mononitrate (IMDUR) 30 MG 24 hr tablet TAKE ONE (1) TABLET BY MOUTH EVERY DAY 05/12/15   Lorretta Harp, MD  metFORMIN (GLUCOPHAGE-XR) 500 MG 24 hr tablet TAKE ONE (1) TABLET BY MOUTH EVERY DAY WITH BREAKFAST (OFFICE VISIT NEEDED FOR ADDITIONAL REFILLS) 08/05/15   Mancel Bale, PA-C  metoprolol (LOPRESSOR) 100 MG tablet TAKE ONE (1) TABLET BY MOUTH TWO (2) TIMES DAILY 10/10/14   Lorretta Harp, MD  mometasone (NASONEX) 50 MCG/ACT nasal spray Place 2 sprays into the nose daily. 10/23/12   Deneise Lever, MD  nitroGLYCERIN (NITROSTAT) 0.4 MG SL tablet Place 1 tablet (0.4 mg total) under the tongue every 5 (five) minutes as needed. Patient not taking: Reported on 08/04/2015 05/12/15   Lorretta Harp, MD  NONFORMULARY OR COMPOUNDED ITEM Allergy Vaccine 1:10 Given at [facility]    Historical Provider, MD  pantoprazole (PROTONIX) 20 MG tablet Take 20 mg by mouth daily.    Historical Provider, MD  rosuvastatin (CRESTOR) 10 MG tablet Take 1 tablet (10 mg total) by mouth daily. 08/07/15   [name], MD  sertraline (ZOLOFT) 100 MG tablet Take 2 by mouth once daily 05/19/15    Kathlee Nations, MD  Trospium Chloride 60 MG CP24 TAKE ONE CAPSULE BY MOUTH DAILY 11/06/13   Collene Leyden, PA-C  warfarin (COUMADIN) 7.5 MG tablet TAKE ONE AND ONE-HALF (1 1/2) TO TWO (2)TABLETS BY MOUTH DAILY AS DIRECTED 05/12/15   Lorretta Harp, MD  warfarin (COUMADIN) 7.5 MG tablet TAKE ONE AND ONE-HALF (1 1/2) TO TWO (2)TABLETS BY MOUTH DAILY AS DIRECTED 08/06/15   Lorretta Harp, MD  ROS:  Constitutional: negative for fever, chills, night sweats, weight changes; positive for fatigue  HEENT: negative for vision changes, hearing loss, congestion, rhinorrhea, ST, epistaxis, or sinus pressure Cardiovascular: negative for chest pain or palpitations Respiratory: negative for hemoptysis, wheezing, shortness of breath, or cough Abdominal: negative for abdominal pain, nausea, vomiting, diarrhea, or constipation Dermatological: negative for rash Neurologic: negative for headache, dizziness, or syncope All other systems reviewed and are otherwise negative with the exception to those above and in the HPI.  PHYSICAL EXAM: Filed Vitals:   08/08/15 0836  BP: 106/71  Pulse: 69  Temp: 97.9 F (36.6 C)  Resp: 16   Body mass index is 52.7 kg/(m^2).   General: Alert, no acute distress, Morbidly obese HEENT:  Normocephalic, atraumatic, oropharynx patent. Eye: [facility] Cardiovascular:  Slight irregular rate, regular rhythm, no rubs murmurs or gallops.  No Carotid bruits, radial pulse intact. No pedal edema.  Respiratory: Clear to auscultation bilaterally.  No wheezes, rales, or rhonchi.  No cyanosis, no use of accessory musculature Abdominal: No organomegaly, abdomen is soft and non-tender, positive bowel sounds. No masses. Musculoskeletal: Gait intact. No edema, tenderness Skin: No rashes. Neurologic: Facial musculature symmetric. Psychiatric: Patient acts appropriately throughout our interaction.  Lymphatic: No cervical or submandibular lymphadenopathy Genitourinary/Anorectal: No  acute findings   LABS: Results for orders placed or performed in visit on 08/08/15  POCT glucose (manual entry)  Result Value Ref Range   POC Glucose 91 70 - 99 mg/dl  POCT glycosylated hemoglobin (Hb A1C)  Result Value Ref Range   Hemoglobin A1C 6.3    *Note: Due to a large number of results and/or encounters for the requested time period, some results have not been displayed. A complete set of results can be found in Results Review.    EKG/XRAY:   Primary read interpreted by Dr. [name] at [facility].   ASSESSMENT/PLAN: Hemoglobin A1c is stable at 6.3. I decreased her Cardizem to 180 dosage. I took out the HCTZ from her blood pressure medication and see if that helps with some of her dizziness and weak spells. I do think the family needs to look at assisted living. They also need to discuss her psychiatric meds with the psychiatrist. Referral made to Dr. Zadie Rhine for diabetic eye check. Referral made back to urologist for incontinence.I personally performed the services described in this documentation, which was scribed in my presence. The recorded information has been reviewed and is accurate.Johney Maine sideeffects, risk and benefits, and alternatives of medications d/w patient. Patient is aware that all medications have potential sideeffects and we are unable to predict every sideeffect or drug-drug interaction that may occur.  [name] MD 08/08/2015 9:11 AM

## 2015-08-08 NOTE — Patient Instructions (Signed)
Because of your low blood pressure I have made the following changes. I have taken the diuretic out of your Avalide. I decreased the dosage of your Cardizem to 180. I have made you an appointment to see a urologist. I have made you an appointment to see an eye specialist. Be sure you discuss your psychiatric meds with your psychiatrist next week.

## 2015-08-11 ENCOUNTER — Ambulatory Visit (INDEPENDENT_AMBULATORY_CARE_PROVIDER_SITE_OTHER): Payer: No Typology Code available for payment source | Admitting: Psychology

## 2015-08-11 DIAGNOSIS — F4310 Post-traumatic stress disorder, unspecified: Secondary | ICD-10-CM | POA: Diagnosis not present

## 2015-08-11 DIAGNOSIS — F331 Major depressive disorder, recurrent, moderate: Secondary | ICD-10-CM

## 2015-08-11 NOTE — Progress Notes (Signed)
   THERAPIST PROGRESS NOTE  Session Time: 10.36am-11.20am  Participation Level: Active  Behavioral Response: Well GroomedAlertaffect WNL  Type of Therapy: Individual Therapy  Treatment Goals addressed: Diagnosis: MDD, PTSD and goal 1.  Interventions: Strength-based and Supportive  Summary: Michele [name] is a 65 y.o. female who presents with report of mood good currently.  Pt reported that she did have a little slump in her mood at the holidays w/ missing loved ones- but made through with interactions w/ living family and getting out of the house.  Pt reports that she completed her physical and occupational therapy and is encouraged to continue her exercises to continue her progress. Pt reports that she is still working w/ the Astronomer.  Pt discussed want to travel to [place] and discussed that w/her limitations would just have to plan for travel differently then past.  Pt discussed her sisters and daughters wants to travel to other places and discussed that could express her wants.     Suicidal/Homicidal: Nowithout intent/plan  Therapist Response: Assessed pt current functioning per pt report.  Processed w/pt her holidays and focused w/pt her coping skills used for mood during grieving.  Explored w/ pt her wants for travel and assisted pt in focusing on accepting limitations, planning to not let be barriers   Plan: Return again in 2 weeks.  Diagnosis: MDD, PTSD    Laquinton Bihm, LPC 08/11/2015

## 2015-08-14 ENCOUNTER — Encounter: Payer: Self-pay | Admitting: Family Medicine

## 2015-08-14 ENCOUNTER — Ambulatory Visit (INDEPENDENT_AMBULATORY_CARE_PROVIDER_SITE_OTHER): Payer: Medicare Other

## 2015-08-14 DIAGNOSIS — J309 Allergic rhinitis, unspecified: Secondary | ICD-10-CM

## 2015-08-19 ENCOUNTER — Encounter (HOSPITAL_COMMUNITY): Payer: Self-pay | Admitting: Psychiatry

## 2015-08-19 ENCOUNTER — Ambulatory Visit (INDEPENDENT_AMBULATORY_CARE_PROVIDER_SITE_OTHER): Payer: No Typology Code available for payment source | Admitting: Psychiatry

## 2015-08-19 VITALS — BP 136/78 | HR 80 | Ht 62.0 in | Wt 293.8 lb

## 2015-08-19 DIAGNOSIS — F4310 Post-traumatic stress disorder, unspecified: Secondary | ICD-10-CM

## 2015-08-19 DIAGNOSIS — F331 Major depressive disorder, recurrent, moderate: Secondary | ICD-10-CM | POA: Diagnosis not present

## 2015-08-19 MED ORDER — ARIPIPRAZOLE 5 MG PO TABS: 5.0000 mg | ORAL_TABLET | Freq: Every day | ORAL | Status: DC

## 2015-08-19 MED ORDER — BENZTROPINE MESYLATE 0.5 MG PO TABS
0.5000 mg | ORAL_TABLET | Freq: Every day | ORAL | Status: DC
Start: 2015-08-19 — End: 2015-11-18

## 2015-08-19 MED ORDER — SERTRALINE HCL 100 MG PO TABS: ORAL_TABLET | ORAL | Status: DC
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
## 2015-08-20 ENCOUNTER — Ambulatory Visit (INDEPENDENT_AMBULATORY_CARE_PROVIDER_SITE_OTHER): Payer: No Typology Code available for payment source | Admitting: Psychology

## 2015-08-20 DIAGNOSIS — F331 Major depressive disorder, recurrent, moderate: Secondary | ICD-10-CM | POA: Diagnosis not present

## 2015-08-20 DIAGNOSIS — F4310 Post-traumatic stress disorder, unspecified: Secondary | ICD-10-CM | POA: Diagnosis not present

## 2015-08-20 NOTE — Progress Notes (Signed)
   THERAPIST PROGRESS NOTE  Session Time: 9am-9.50am  Participation Level: Active  Behavioral Response: Well GroomedAlertAFFECT WNL  Type of Therapy: Individual Therapy  Treatment Goals addressed: Diagnosis: MDD and goal 1.  Interventions: CBT and Supportive  Summary: Michele [name] is a 65 y.o. female who presents with report of being tired- affect wNL.  Pt reported that she woke w/  A lot of pain this morning- but was able to get moving and doing ok.  Pt reported she has been tired and feels that related to multiple appointments w/ Occupational and Speech Therapy every week keeping her busy.  Pt reports that good for her.  Pt reported that her mood has been ok.  Pt reported that she has experienced times of feeling a lot of gratitude.  Pt reported that she struggles most w/ her aging body- incontinence and IBS- having accidents and feeling frustration w/ self. Pt is able to reframe and focus on loving self despite aging body.  Suicidal/Homicidal: Nowithout intent/plan  Therapist Response: Assessed pt current functioning per pt report.  Explored w/pt her mood and things that she is grateful for and frustrations.  Discussed self compassion of aging body and practice of statements and acts of this.    Plan: Return again in 2 weeks.  Diagnosis: MDD, PTSD   [name],[name], [place] 08/20/2015

## 2015-08-21 ENCOUNTER — Ambulatory Visit (INDEPENDENT_AMBULATORY_CARE_PROVIDER_SITE_OTHER): Payer: Medicare Other

## 2015-08-21 DIAGNOSIS — J309 Allergic rhinitis, unspecified: Secondary | ICD-10-CM | POA: Diagnosis not present

## 2015-08-26 DIAGNOSIS — H2512 Age-related nuclear cataract, left eye: Secondary | ICD-10-CM | POA: Diagnosis not present

## 2015-08-26 DIAGNOSIS — H2511 Age-related nuclear cataract, right eye: Secondary | ICD-10-CM | POA: Diagnosis not present

## 2015-08-26 DIAGNOSIS — H43811 Vitreous degeneration, right eye: Secondary | ICD-10-CM | POA: Diagnosis not present

## 2015-08-26 DIAGNOSIS — E119 Type 2 diabetes mellitus without complications: Secondary | ICD-10-CM | POA: Diagnosis not present

## 2015-08-27 ENCOUNTER — Telehealth: Payer: Self-pay

## 2015-08-27 NOTE — Telephone Encounter (Signed)
Msg is for Dr. [name] She is getting towards end of occupational therapy and [place] noticed a lot of wheezing at the end of her sessions. She does have a appointment for respiratory specialist tomorrow. She also wants to know if she needs to follow up with Dr. [name]  Please advise [phone]

## 2015-08-28 ENCOUNTER — Ambulatory Visit (INDEPENDENT_AMBULATORY_CARE_PROVIDER_SITE_OTHER): Payer: Medicare Other

## 2015-08-28 DIAGNOSIS — J309 Allergic rhinitis, unspecified: Secondary | ICD-10-CM | POA: Diagnosis not present

## 2015-08-28 NOTE — Telephone Encounter (Signed)
If she is having wheezing she needs to follow-up with her cardiologist. I believe she also may have a pulmonary specialist.

## 2015-08-29 ENCOUNTER — Ambulatory Visit (INDEPENDENT_AMBULATORY_CARE_PROVIDER_SITE_OTHER): Payer: Medicare Other | Admitting: Pharmacist Clinician (PhC)/ Clinical Pharmacy Specialist

## 2015-08-29 DIAGNOSIS — Z7901 Long term (current) use of anticoagulants: Secondary | ICD-10-CM | POA: Diagnosis not present

## 2015-08-29 DIAGNOSIS — I480 Paroxysmal atrial fibrillation: Secondary | ICD-10-CM | POA: Diagnosis not present

## 2015-08-29 LAB — POCT INR: INR: 1.8

## 2015-09-02 ENCOUNTER — Other Ambulatory Visit: Payer: Self-pay | Admitting: Cardiovascular Disease

## 2015-09-02 ENCOUNTER — Other Ambulatory Visit (HOSPITAL_COMMUNITY): Payer: Self-pay | Admitting: Psychiatry

## 2015-09-02 NOTE — Telephone Encounter (Signed)
Please advise patient needs to follow up with her cardiologist regarding wheezing post exercise.  Philis Fendt, MS, PA-C 3:38 PM, 09/02/2015

## 2015-09-04 ENCOUNTER — Ambulatory Visit (INDEPENDENT_AMBULATORY_CARE_PROVIDER_SITE_OTHER): Payer: Medicare Other

## 2015-09-04 DIAGNOSIS — J309 Allergic rhinitis, unspecified: Secondary | ICD-10-CM

## 2015-09-04 DIAGNOSIS — M546 Pain in thoracic spine: Secondary | ICD-10-CM | POA: Diagnosis not present

## 2015-09-04 DIAGNOSIS — M5136 Other intervertebral disc degeneration, lumbar region: Secondary | ICD-10-CM | POA: Diagnosis not present

## 2015-09-04 DIAGNOSIS — G894 Chronic pain syndrome: Secondary | ICD-10-CM | POA: Diagnosis not present

## 2015-09-04 DIAGNOSIS — Z79891 Long term (current) use of opiate analgesic: Secondary | ICD-10-CM | POA: Diagnosis not present

## 2015-09-05 ENCOUNTER — Other Ambulatory Visit (HOSPITAL_COMMUNITY): Payer: Self-pay | Admitting: Psychiatry

## 2015-09-09 ENCOUNTER — Ambulatory Visit (INDEPENDENT_AMBULATORY_CARE_PROVIDER_SITE_OTHER): Payer: No Typology Code available for payment source | Admitting: Psychology

## 2015-09-09 DIAGNOSIS — F4310 Post-traumatic stress disorder, unspecified: Secondary | ICD-10-CM

## 2015-09-09 DIAGNOSIS — F331 Major depressive disorder, recurrent, moderate: Secondary | ICD-10-CM | POA: Diagnosis not present

## 2015-09-09 NOTE — Progress Notes (Signed)
   THERAPIST PROGRESS NOTE  Session Time: 11.03am-11.50am  Participation Level: Active  Behavioral Response: Well GroomedAlertAnxious  Type of Therapy: Individual Therapy  Treatment Goals addressed: Diagnosis: PTSD, MDD and goal 1.  Interventions: CBT and Supportive  Summary: Michele [name] is a 65 y.o. female who presents with report of waking and feeling more anxious this morning.  Pt isn't able to identify anything that contributed to increased anxiety- felt that might have been related to dream- although can't recall. Pt was able to focus on her self soothing activites to assist in coping.  Pt discussed that her occupational, and physical therapies have come to an end- but has exercises to continue practicing to strength her memory and leg strength.  Pt reported on positive interactions and connections w/ her children, grandchildren and siblings. .   Suicidal/Homicidal: Nowithout intent/plan  Therapist Response: Assessed pt current functioning per her report.  Processed w/pt increased anxiety and reframe that aware so can use to make choices for self about self care.  Encouraged pt connection w/ self care to assist w/ regulating system.  Focused on pt strengths and practice of self love/compassion.   Plan: Return again in 2 weeks.  Diagnosis: PTSD, MDD    Shandell Jallow, [facility] 09/09/2015

## 2015-09-10 ENCOUNTER — Encounter: Payer: Self-pay | Admitting: Internal Medicine

## 2015-09-11 ENCOUNTER — Ambulatory Visit (INDEPENDENT_AMBULATORY_CARE_PROVIDER_SITE_OTHER): Payer: Medicare Other

## 2015-09-11 DIAGNOSIS — J309 Allergic rhinitis, unspecified: Secondary | ICD-10-CM | POA: Diagnosis not present

## 2015-09-17 ENCOUNTER — Ambulatory Visit (INDEPENDENT_AMBULATORY_CARE_PROVIDER_SITE_OTHER): Payer: Medicare Other | Admitting: Pharmacist Clinician (PhC)/ Clinical Pharmacy Specialist

## 2015-09-17 DIAGNOSIS — Z7901 Long term (current) use of anticoagulants: Secondary | ICD-10-CM | POA: Diagnosis not present

## 2015-09-17 DIAGNOSIS — I480 Paroxysmal atrial fibrillation: Secondary | ICD-10-CM

## 2015-09-17 LAB — POCT INR: INR: 3

## 2015-09-18 ENCOUNTER — Ambulatory Visit (INDEPENDENT_AMBULATORY_CARE_PROVIDER_SITE_OTHER): Payer: Medicare Other | Admitting: *Deleted

## 2015-09-18 DIAGNOSIS — J309 Allergic rhinitis, unspecified: Secondary | ICD-10-CM

## 2015-09-18 DIAGNOSIS — N3281 Overactive bladder: Secondary | ICD-10-CM | POA: Diagnosis not present

## 2015-09-18 DIAGNOSIS — Z0000 Encounter for general adult medical examination without abnormal findings: Secondary | ICD-10-CM | POA: Diagnosis not present

## 2015-09-25 ENCOUNTER — Ambulatory Visit (INDEPENDENT_AMBULATORY_CARE_PROVIDER_SITE_OTHER): Payer: Medicare Other | Admitting: *Deleted

## 2015-09-25 DIAGNOSIS — J309 Allergic rhinitis, unspecified: Secondary | ICD-10-CM | POA: Diagnosis not present

## 2015-09-30 ENCOUNTER — Ambulatory Visit (INDEPENDENT_AMBULATORY_CARE_PROVIDER_SITE_OTHER): Payer: No Typology Code available for payment source | Admitting: Psychology

## 2015-09-30 DIAGNOSIS — F4310 Post-traumatic stress disorder, unspecified: Secondary | ICD-10-CM

## 2015-09-30 DIAGNOSIS — F331 Major depressive disorder, recurrent, moderate: Secondary | ICD-10-CM

## 2015-09-30 NOTE — Progress Notes (Signed)
   THERAPIST PROGRESS NOTE  Session Time: 11.05am-11.50  Participation Level: Active  Behavioral Response: Well GroomedAlertDepressed  Type of Therapy: Individual Therapy  Treatment Goals addressed: Diagnosis: MDD and goal 1.  Interventions: CBT, Strength-based and Supportive  Summary: Michele [name] is a 65 y.o. female who presents with report of depressed mood w/ the anniversary of her mom's death this month and her birthday as well.  Pt reported she is missing mom and had been trying not to think about so much.  Pt agreed that avoidance isn't always beneficial and focusing her on talking w/ supports, acknowledging ok to miss and focusing on memorializing her. Pt discussed her interactions w/ her grandchildren and that this has been positive and was able to identify role she plays towards them and this as positive.  Pt reported that she was started samples on new medication for incontinence and this was helpful slowing frequency and urgency.  Pt reported she needs to get back to her exercises as helpful for her movement.  Pt identified what could do today.  Suicidal/Homicidal: Nowithout intent/plan  Therapist Response: Assessed pt current functioning per pt report.  Processed w/pt her grief- normalized and encouraged not to avoid or attempt to suppress feelings.  Discussed how to utilize supports and focus on her presence to her today.  Assisted pt in focusing on her positives, gratitude and what she can do today for her self care.   Plan: Return again in 2 weeks.  Diagnosis:   MDD, PTSD   Reginal Wojcicki, [place] 09/30/2015

## 2015-10-02 ENCOUNTER — Ambulatory Visit (INDEPENDENT_AMBULATORY_CARE_PROVIDER_SITE_OTHER): Payer: Medicare Other | Admitting: *Deleted

## 2015-10-02 DIAGNOSIS — J309 Allergic rhinitis, unspecified: Secondary | ICD-10-CM | POA: Diagnosis not present

## 2015-10-08 ENCOUNTER — Telehealth: Payer: Self-pay | Admitting: Internal Medicine

## 2015-10-08 DIAGNOSIS — J309 Allergic rhinitis, unspecified: Secondary | ICD-10-CM | POA: Diagnosis not present

## 2015-10-08 NOTE — Telephone Encounter (Signed)
Allergy Serum Extract Date Mixed: 10/08/15 Vial: 2 Strength: 1:10 Here/Mail/Pick Up: here Mixed By: tbs Last OV: 01/06/15 Pending OV: 01/06/16

## 2015-10-09 ENCOUNTER — Ambulatory Visit: Payer: Self-pay

## 2015-10-13 ENCOUNTER — Other Ambulatory Visit (HOSPITAL_COMMUNITY): Payer: Self-pay | Admitting: Psychiatry

## 2015-10-14 ENCOUNTER — Encounter: Payer: Medicare Other | Admitting: Pharmacist Clinician (PhC)/ Clinical Pharmacy Specialist

## 2015-10-14 ENCOUNTER — Telehealth: Payer: Self-pay | Admitting: Cardiovascular Disease

## 2015-10-14 ENCOUNTER — Ambulatory Visit (HOSPITAL_COMMUNITY): Payer: Self-pay | Admitting: Psychology

## 2015-10-14 ENCOUNTER — Encounter (HOSPITAL_COMMUNITY): Payer: Self-pay | Admitting: Psychology

## 2015-10-14 NOTE — Telephone Encounter (Signed)
New message   Pt c/o swelling: STAT is pt has developed SOB within 24 hours  1. How long have you been experiencing swelling? 09-16-15  2. Where is the swelling located? ankles and feet   3.  Are you currently taking a "fluid pill"? yes  4.  Are you currently SOB? yes  5.  Have you traveled recently? no

## 2015-10-14 NOTE — Progress Notes (Signed)
Michele [name] is a 65 y.o. female patient who cancelled her appointment today wasn't feeling well w/ swelling.Marland Kitchen        [name], LPC

## 2015-10-14 NOTE — Telephone Encounter (Signed)
Spoke to patient- she had a appointment today with coumadin clinic She states she has swelling in ankles L >R . She states she does not remember injuring her ankles She states it started yesterday.,  She does not weight herself daily. She has not taken her lasix today.  NO CHEST PAIN , NO SOB PER PATIENT  Informed patient to elevate ankle, take lasix now and again about 3-4 pm today.  RESCHEDULE COUMADIN CLINIC TO 10/17/15 AT 10 AM

## 2015-10-16 ENCOUNTER — Ambulatory Visit (INDEPENDENT_AMBULATORY_CARE_PROVIDER_SITE_OTHER): Payer: Medicare Other | Admitting: *Deleted

## 2015-10-16 DIAGNOSIS — J309 Allergic rhinitis, unspecified: Secondary | ICD-10-CM | POA: Diagnosis not present

## 2015-10-17 ENCOUNTER — Ambulatory Visit (INDEPENDENT_AMBULATORY_CARE_PROVIDER_SITE_OTHER): Payer: Medicare Other | Admitting: Pharmacist Clinician (PhC)/ Clinical Pharmacy Specialist

## 2015-10-17 DIAGNOSIS — I480 Paroxysmal atrial fibrillation: Secondary | ICD-10-CM | POA: Diagnosis not present

## 2015-10-17 DIAGNOSIS — Z7901 Long term (current) use of anticoagulants: Secondary | ICD-10-CM | POA: Diagnosis not present

## 2015-10-17 LAB — POCT INR: INR: 3.3

## 2015-10-20 ENCOUNTER — Other Ambulatory Visit (HOSPITAL_COMMUNITY): Payer: Self-pay | Admitting: Psychiatry

## 2015-10-21 ENCOUNTER — Other Ambulatory Visit (HOSPITAL_COMMUNITY): Payer: Self-pay | Admitting: Psychiatry

## 2015-10-21 NOTE — Telephone Encounter (Signed)
She should be getting Abilify 5 mg which was reduced because of memory impairment.  She was given last prescription on January 31 with 90 days and it is too soon to refill.  Please discontinue 10 mg Abilify from her profile.

## 2015-10-22 ENCOUNTER — Other Ambulatory Visit (HOSPITAL_COMMUNITY): Payer: Self-pay | Admitting: Psychiatry

## 2015-10-23 ENCOUNTER — Ambulatory Visit (INDEPENDENT_AMBULATORY_CARE_PROVIDER_SITE_OTHER): Payer: Medicare Other | Admitting: *Deleted

## 2015-10-23 DIAGNOSIS — J309 Allergic rhinitis, unspecified: Secondary | ICD-10-CM | POA: Diagnosis not present

## 2015-10-30 ENCOUNTER — Other Ambulatory Visit (HOSPITAL_COMMUNITY): Payer: Self-pay | Admitting: Psychiatry

## 2015-10-30 ENCOUNTER — Ambulatory Visit (INDEPENDENT_AMBULATORY_CARE_PROVIDER_SITE_OTHER): Payer: Medicare Other | Admitting: *Deleted

## 2015-10-30 ENCOUNTER — Other Ambulatory Visit: Payer: Self-pay | Admitting: Cardiovascular Disease

## 2015-10-30 ENCOUNTER — Other Ambulatory Visit: Payer: Self-pay | Admitting: Physician Assistant

## 2015-10-30 ENCOUNTER — Other Ambulatory Visit: Payer: Self-pay | Admitting: Internal Medicine

## 2015-10-30 DIAGNOSIS — J309 Allergic rhinitis, unspecified: Secondary | ICD-10-CM | POA: Diagnosis not present

## 2015-10-30 NOTE — Telephone Encounter (Signed)
Rx(s) sent to pharmacy electronically. Scheduled for MD OV 11/25/15

## 2015-11-03 ENCOUNTER — Other Ambulatory Visit (HOSPITAL_COMMUNITY): Payer: Self-pay | Admitting: Psychiatry

## 2015-11-04 ENCOUNTER — Ambulatory Visit: Payer: Medicare Other | Admitting: Emergency Medicine

## 2015-11-04 ENCOUNTER — Ambulatory Visit (INDEPENDENT_AMBULATORY_CARE_PROVIDER_SITE_OTHER): Payer: No Typology Code available for payment source | Admitting: Psychology

## 2015-11-04 DIAGNOSIS — F331 Major depressive disorder, recurrent, moderate: Secondary | ICD-10-CM

## 2015-11-04 NOTE — Progress Notes (Signed)
   THERAPIST PROGRESS NOTE  Session Time: 9.45am-10.30am  Participation Level: Active  Behavioral Response: Well GroomedAlert, "tired" and down  Type of Therapy: Individual Therapy  Treatment Goals addressed: Diagnosis: MDD and goal 1  Interventions: CBT and Supportive  Summary: Michele [name] is a 65 y.o. female who presents with depressed mood and affect.  Pt reported that she didn't feel like getting out today- pt reports feeling in pain and swelling.  Pt reports that she also feels drained emotionally- identifies pain and grief as contributing factors.  Pt discussed that she is staying focused on interactions w/ supports and that has enjoyed their company.  Pt agrees to continue self care activities for soothing and connecting to present gratitude.    Suicidal/Homicidal: Nowithout intent/plan  Therapist Response: Assessed pt current functioning per pt report.  Processed w/pt her depressed mood and contributing factors.  Explored w/pt how she is coping through- validating and normalizing grief.  Assisted pt in connect w/ her self care strategies.  Plan: Return again in 2 weeks.  Diagnosis: MDD    [name], [facility] 11/04/2015

## 2015-11-06 ENCOUNTER — Ambulatory Visit (INDEPENDENT_AMBULATORY_CARE_PROVIDER_SITE_OTHER): Payer: Medicare Other | Admitting: *Deleted

## 2015-11-06 ENCOUNTER — Ambulatory Visit (INDEPENDENT_AMBULATORY_CARE_PROVIDER_SITE_OTHER): Payer: Medicare Other | Admitting: Emergency Medicine

## 2015-11-06 ENCOUNTER — Ambulatory Visit: Payer: Self-pay

## 2015-11-06 ENCOUNTER — Encounter: Payer: Self-pay | Admitting: Emergency Medicine

## 2015-11-06 VITALS — BP 129/82 | HR 80 | Temp 98.1°F | Resp 16 | Ht 61.0 in | Wt 299.8 lb

## 2015-11-06 DIAGNOSIS — J309 Allergic rhinitis, unspecified: Secondary | ICD-10-CM

## 2015-11-06 DIAGNOSIS — F329 Major depressive disorder, single episode, unspecified: Secondary | ICD-10-CM

## 2015-11-06 DIAGNOSIS — E119 Type 2 diabetes mellitus without complications: Secondary | ICD-10-CM | POA: Diagnosis not present

## 2015-11-06 DIAGNOSIS — F32A Depression, unspecified: Secondary | ICD-10-CM

## 2015-11-06 LAB — GLUCOSE, POCT (MANUAL RESULT ENTRY): POC GLUCOSE: 99 mg/dL (ref 70–99)

## 2015-11-06 LAB — POCT GLYCOSYLATED HEMOGLOBIN (HGB A1C): Hemoglobin A1C: 6.2

## 2015-11-06 NOTE — Patient Instructions (Signed)
     IF you received an x-ray today, you will receive an invoice from [facility]. Please contact [facility] at [phone] with questions or concerns regarding your invoice.   IF you received labwork today, you will receive an invoice from [facility]. Please contact [facility] at [phone] with questions or concerns regarding your invoice.   Our billing staff will not be able to assist you with questions regarding bills from these companies.  You will be contacted with the lab results as soon as they are available. The fastest way to get your results is to activate your My Chart account. Instructions are located on the last page of this paperwork. If you have not heard from us regarding the results in 2 weeks, please contact this office.      

## 2015-11-06 NOTE — Progress Notes (Addendum)
Patient ID: Michele [name], female   DOB: 1951-03-06, 65 y.o.   MRN: [id number]     By signing my name below, I, Zola Button, attest that this documentation has been prepared under the direction and in the presence of [name], MD.  Electronically Signed: Zola Button, Medical Scribe. 11/06/2015. 10:22 AM.   Chief Complaint:  Chief Complaint  Patient presents with  . Follow-up  . Diabetes  . depression scale during triage    score 11    HPI: Michele [name] is a 65 y.o. female with a history of DM and depression who reports to [facility] today for a follow-up.  Patient's mother passed away 5 years ago. She thinks about her mother's death more every year around Easter, which is around the time her mother passed away, so she has been feeling more depressed lately. She saw her therapist, [name], 2 days ago. Patient recently saw Dr. Karsten Ro. She will be seeing Dr. Adele Schilder, Dr. Krista Blue, Dr. Gwenlyn Found and Dr. Nelva Bush next month. Her last urine micro albumin 4 months ago was <0.2. Her last A1c 3 months ago was 6.3.  Past Medical History  Diagnosis Date  . Allergic rhinitis   . Asthma   . CAD (coronary artery disease)   . Hypertension   . Sleep apnea   . Diabetes mellitus type II   . Depression   . PTSD (post-traumatic stress disorder)   . 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 Carpal tunnel syndrome, right   . Hyperlipidemia   . Paroxysmal a-fib ([place])   . CAD (coronary artery disease) 01/30/2007    stents trivial coronary artery disease diffusely, with a recent deployment od a intracoronary artery stent, 3.5 x 12 mm driver stent with no more than 20-30% in- stents restenosis.done by Dr [name] with re-look on novenber 10 2008 revealing a widely patent stent with otherwise trival CAD and normal LV function  . Chest pain 07/17/12009    2 D Echo EF >55%  . Obesity   . Gait instability    Past Surgical History  Procedure Laterality Date  . Cholecystectomy    . Left groin  cyst    . Kidney stones    . Stress myocardial perfusion study  09/08/2010    normal; EF 75%  . Multilevel lumbar degenerative disc disease    . Hemorrhoid surgery    . Gallbladder surgery     Social History   Social History  . Marital Status: Divorced    Spouse Name: N/A  . Number of Children: 2  . Years of Education: 14   Occupational History  . 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 Smoking status: Never Smoker   . Smokeless tobacco: Never Used  . Alcohol Use: No  . Drug Use: No  . Sexual Activity: Not Asked   Other Topics Concern  . None   Social History Narrative   Lives at home alone.   Right-handed.   Occasional caffeine use.   Family History  Problem Relation Age of Onset  . Coronary artery disease Mother   . Coronary artery disease Father   . Depression Father    Allergies  Allergen Reactions  . Meprobamate Nausea And Vomiting   Prior to Admission medications   Medication Sig Start Date End Date Taking? Authorizing Provider  ADVAIR DISKUS 250-50 MCG/DOSE AEPB INHALE ONE PUFF TWICE DAILY 10/30/15  Yes [place] D Young,  MD  albuterol (PROVENTIL) (2.5 MG/3ML) 0.083% nebulizer solution Take 3 mLs (2.5 mg total) by nebulization every 6 (six) hours as needed. 10/25/14 04/20/17 Yes [place] D Young, MD  ARIPiprazole (ABILIFY) 5 MG tablet Take 1 tablet (5 mg total) by mouth daily. 08/19/15  Yes Kathlee Nations, MD  aspirin 81 MG tablet Take 81 mg by mouth daily.     Yes Historical Provider, MD  benztropine (COGENTIN) 0.5 MG tablet Take 1 tablet (0.5 mg total) by mouth at bedtime. 08/19/15  Yes Kathlee Nations, MD  diltiazem (CARDIZEM CD) 180 MG 24 hr capsule Take 1 capsule (180 mg total) by mouth daily. 08/08/15  Yes [name], MD  fexofenadine (ALLEGRA) 60 MG tablet Take 1 tablet (60 mg total) by mouth daily. Take 60 mg by mouth daily. 12/16/10  Yes Deneise Lever, MD  furosemide (LASIX) 40 MG tablet TAKE ONE (1) TABLET BY MOUTH EVERY DAY 08/15/14  Yes  Lorretta Harp, MD  gabapentin (NEURONTIN) 300 MG capsule Take 300 mg by mouth at bedtime. 07/04/15  Yes Historical Provider, MD  HYDROcodone-acetaminophen (NORCO) 10-325 MG per tablet Take 1 tablet by mouth every 6 (six) hours as needed. 09/19/14  Yes Historical Provider, MD  irbesartan (AVAPRO) 150 MG tablet Take 1 tablet (150 mg total) by mouth daily. 08/08/15  Yes [name], MD  isosorbide mononitrate (IMDUR) 30 MG 24 hr tablet TAKE ONE (1) TABLET BY MOUTH EVERY DAY 05/12/15  Yes Lorretta Harp, MD  metFORMIN (GLUCOPHAGE-XR) 500 MG 24 hr tablet TAKE ONE (1) TABLET BY MOUTH EVERY DAY WITH BREAKFAST 10/30/15  Yes [name], MD  metoprolol (LOPRESSOR) 100 MG tablet TAKE ONE (1) TABLET BY MOUTH TWO (2) TIMES DAILY 10/30/15  Yes Lorretta Harp, MD  mometasone (NASONEX) 50 MCG/ACT nasal spray Place 2 sprays into the nose daily. 10/23/12  Yes Deneise Lever, MD  nitroGLYCERIN (NITROSTAT) 0.4 MG SL tablet Place 1 tablet (0.4 mg total) under the tongue every 5 (five) minutes as needed. 05/12/15  Yes Lorretta Harp, MD  pantoprazole (PROTONIX) 20 MG tablet Take 20 mg by mouth daily.   Yes Historical Provider, MD  rosuvastatin (CRESTOR) 10 MG tablet Take 1 tablet (10 mg total) by mouth daily. 08/07/15  Yes [name], MD  sertraline (ZOLOFT) 100 MG tablet Take 2 by mouth once daily 08/19/15  Yes Kathlee Nations, MD  Trospium Chloride 60 MG CP24 TAKE ONE CAPSULE BY MOUTH DAILY 11/06/13  Yes Collene Leyden, PA-C  warfarin (COUMADIN) 7.5 MG tablet TAKE ONE AND ONE-HALF (1 1/2) TO TWO (2)TABLETS BY MOUTH DAILY AS DIRECTED 05/12/15  Yes Lorretta Harp, MD  warfarin (COUMADIN) 7.5 MG tablet TAKE ONE AND ONE-HALF (1 1/2) TO TWO (2)TABLETS BY MOUTH DAILY AS DIRECTED 08/06/15  Yes Lorretta Harp, MD     ROS: The patient denies fevers, chills, night sweats, unintentional weight loss, chest pain, palpitations, nausea, vomiting, abdominal pain, dysuria, hematuria, melena, numbness, weakness, or tingling.     All other systems have been reviewed and were otherwise negative with the exception of those mentioned in the HPI and as above.    PHYSICAL EXAM: Filed Vitals:   11/06/15 0949  BP: 129/82  Pulse: 80  Temp: 98.1 F (36.7 C)  Resp: 16   Body mass index is 56.68 kg/(m^2).   General: Alert, no acute distress. Morbidly obese. HEENT:  Normocephalic, atraumatic, oropharynx patent. Eye: [facility] Cardiovascular:  Regular rate and  rhythm, no rubs murmurs or gallops.  No Carotid bruits, radial pulse intact. No pedal edema.  Respiratory: Clear to auscultation bilaterally.  No wheezes, rales, or rhonchi.  No cyanosis, no use of accessory musculature Abdominal: No organomegaly, abdomen is soft and non-tender, positive bowel sounds.  No masses. Musculoskeletal: Gait intact. No edema, tenderness Skin: No rashes. Neurologic: Facial musculature symmetric. Psychiatric: Patient acts appropriately throughout our interaction. Lymphatic: No cervical or submandibular lymphadenopathy    LABS: Results for orders placed or performed in visit on 11/06/15  POCT glucose (manual entry)  Result Value Ref Range   POC Glucose 99 70 - 99 mg/dl  POCT glycosylated hemoglobin (Hb A1C)  Result Value Ref Range   Hemoglobin A1C 6.2    *Note: Due to a large number of results and/or encounters for the requested time period, some results have not been displayed. A complete set of results can be found in Results Review.     EKG/XRAY:   Primary read interpreted by Dr. [name] at [facility].   ASSESSMENT/PLAN:  Diabetes under good control. No changes in medication. Recheck at 104 in 4 months. She was given [employer] consultant physicians accepting new patients.she saw her therapist yesterday. She is due to see her psychiatrist soon.Her biggest problem is her morbid obesity No current changes in medication.I personally performed the services described in this documentation, which was scribed in my  presence. The recorded information has been reviewed and is accurate.   Gross sideeffects, risk and benefits, and alternatives of medications d/w patient. Patient is aware that all medications have potential sideeffects and we are unable to predict every sideeffect or drug-drug interaction that may occur.  [name] MD 11/06/2015 10:22 AM

## 2015-11-07 ENCOUNTER — Ambulatory Visit (INDEPENDENT_AMBULATORY_CARE_PROVIDER_SITE_OTHER): Payer: Medicare Other | Admitting: Pharmacist Clinician (PhC)/ Clinical Pharmacy Specialist

## 2015-11-07 DIAGNOSIS — I480 Paroxysmal atrial fibrillation: Secondary | ICD-10-CM | POA: Diagnosis not present

## 2015-11-07 DIAGNOSIS — Z7901 Long term (current) use of anticoagulants: Secondary | ICD-10-CM

## 2015-11-07 LAB — POCT INR: INR: 4.2

## 2015-11-13 ENCOUNTER — Telehealth: Payer: Self-pay

## 2015-11-13 ENCOUNTER — Ambulatory Visit (INDEPENDENT_AMBULATORY_CARE_PROVIDER_SITE_OTHER): Payer: Medicare Other | Admitting: *Deleted

## 2015-11-13 DIAGNOSIS — J309 Allergic rhinitis, unspecified: Secondary | ICD-10-CM

## 2015-11-13 NOTE — Telephone Encounter (Signed)
Patient needs FMLA filled out, I have filled it out based off her last FMLA forms and I will place them in Dr Perfecto Kingdom box on 11/13/15 for review if you could please sign them and return them to the FMLA/Disability box at the 102 checkout with in 5-7 business days. Thank you!

## 2015-11-14 NOTE — Telephone Encounter (Signed)
Paperwork scanned and faxed to [phone] on 11/14/15

## 2015-11-18 ENCOUNTER — Ambulatory Visit (INDEPENDENT_AMBULATORY_CARE_PROVIDER_SITE_OTHER): Payer: No Typology Code available for payment source | Admitting: Psychiatry

## 2015-11-18 ENCOUNTER — Encounter (HOSPITAL_COMMUNITY): Payer: Self-pay | Admitting: Psychiatry

## 2015-11-18 VITALS — BP 142/84 | HR 78 | Ht 62.0 in | Wt 299.4 lb

## 2015-11-18 DIAGNOSIS — F331 Major depressive disorder, recurrent, moderate: Secondary | ICD-10-CM | POA: Diagnosis not present

## 2015-11-18 DIAGNOSIS — F4310 Post-traumatic stress disorder, unspecified: Secondary | ICD-10-CM | POA: Diagnosis not present

## 2015-11-18 MED ORDER — SERTRALINE HCL 100 MG PO TABS: ORAL_TABLET | ORAL | Status: DC

## 2015-11-18 MED ORDER — ARIPIPRAZOLE 5 MG PO TABS: 5.0000 mg | ORAL_TABLET | Freq: Every day | ORAL | Status: DC

## 2015-11-18 MED ORDER — BENZTROPINE MESYLATE 0.5 MG PO TABS: 0.5000 mg | ORAL_TABLET | Freq: Every day | ORAL | Status: DC
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
## 2015-11-19 ENCOUNTER — Encounter: Payer: Self-pay | Admitting: Neurology

## 2015-11-19 ENCOUNTER — Ambulatory Visit (INDEPENDENT_AMBULATORY_CARE_PROVIDER_SITE_OTHER): Payer: Medicare Other | Admitting: Neurology

## 2015-11-19 VITALS — BP 130/76 | HR 73 | Ht 62.0 in | Wt 298.8 lb

## 2015-11-19 DIAGNOSIS — M4802 Spinal stenosis, cervical region: Secondary | ICD-10-CM

## 2015-11-19 DIAGNOSIS — R269 Unspecified abnormalities of gait and mobility: Secondary | ICD-10-CM | POA: Diagnosis not present

## 2015-11-19 DIAGNOSIS — Z8673 Personal history of transient ischemic attack (TIA), and cerebral infarction without residual deficits: Secondary | ICD-10-CM | POA: Insufficient documentation

## 2015-11-19 DIAGNOSIS — I6330 Cerebral infarction due to thrombosis of unspecified cerebral artery: Secondary | ICD-10-CM | POA: Diagnosis not present

## 2015-11-19 NOTE — Progress Notes (Signed)
Chief Complaint  Patient presents with  . Cerebrovascular Accident    She completed PT one month ago. Reports her gait continues to be slow and unsteady. She is using a cane to assist with ambulation.  She is having increased problems with swelling in her lower legs, ankles and feet.       PATIENT: Michele [name] DOB: 10/15/1950  Chief Complaint  Patient presents with  . Cerebrovascular Accident    She completed PT one month ago. Reports her gait continues to be slow and unsteady. She is using a cane to assist with ambulation.  She is having increased problems with swelling in her lower legs, ankles and feet.      HISTORICAL  Michele [name] is a 65 years old right-handed female, accompanied by her sister Michele [name], seen in refer by her primary care physician Dr. Darlyne [name] in May 12 2015 for evaluation of unsteady gait, falling episodes  She has past medical history of hypertension, hyperlipidemia, borderline diabetes, coronary artery disease, status post a stent, atrial fibrillation, take Coumadin, also under the care of psychologist for PTSD, on polypharmacy, this including Abilify 15 mg daily, Zoloft 100x2 mg daily.  She had gradual onset unsteady gait since 2015, getting worse over the past couple months, fell twice in October 7 and April 27 2015, her right leg gave out, she also complains of right arm weakness, she has anemia, received iron infusion, she also complains of irritable bowel syndrome, she also complains of bowel and bladder incontinence, complains of right arm, flank, right leg pain, bilateral knee pain, bilateral lower extremity deep achy pain,  I have personally reviewed CAT scan April 28 2015: Periventricular small vessel disease, no acute lesions.  UPDATE May 22 2015: She came with her daughter at today's clinical visit, walking with a cane.  She is most frustrated by her frequent falling episode, mild headaches, bilateral knee pain.  She also complain  of bladder incontinence, urgency, wears pads since 2014,  She has occasional bowel accident.  She denies significant neck pain.   We have reviewed MRI films in May 15 2015: MRI brain: Acute-subacute ischemic infarction in the left internal capsule (posterior limb) and basal ganglia. It measures 19mm in diameter. moderate chronic small vessel disease. MRI cervical: Multilevel degenerative disc disease, mild to moderate canal stenosis multilevel foraminal stenosis at different levels. There was mild edema in the intraspinal ligament of C5-6 level, no spinal cord signal changes,   UPDATE Nov 19 2015: She continued to complains of difficulty walking, notice bilateral feet swelling, has finished physical therapy which has been helpful We have again reviewed MRI of the brain in October 2016, evidence of acute infarction in the left posterior limb of internal capsule and basal ganglion, moderate supratentorial and small vessel disease. She is now taking aspirin 81 mg daily with history of coronary artery disease and also coumadin for stroke paroxysmal atrial fibrillation  REVIEW OF SYSTEMS: Full 14 system review of systems performed and notable only for  fatigue, ringing ears, eye itching, light sensitivity, blurred vision, wheezing, shortness of breath, leg swelling, excessive thirst, diarrhea, insomnia, frequent wakening, snoring, acting out of dreams, incontinence of bladder, frequent urination, urgency, joint swelling, aching muscles, muscle cramps, walking difficulty, anemia, headache, numbness, weakness, decreased concentration, depression anxiety  ALLERGIES: Allergies  Allergen Reactions  . Meprobamate Nausea And Vomiting    HOME MEDICATIONS: Current Outpatient Prescriptions  Medication Sig Dispense Refill  . ADVAIR DISKUS 250-50 MCG/DOSE AEPB INHALE ONE  PUFF TWICE DAILY 60 each 5  . albuterol (PROVENTIL) (2.5 MG/3ML) 0.083% nebulizer solution Take 3 mLs (2.5 mg total) by nebulization every 6  (six) hours as needed. 360 mL 2  . ARIPiprazole (ABILIFY) 15 MG tablet Take 1 tablet (15 mg total) by mouth daily. 90 tablet 0  . aspirin 81 MG tablet Take 81 mg by mouth daily.      . benztropine (COGENTIN) 0.5 MG tablet Take 1 tablet (0.5 mg total) by mouth at bedtime. 90 tablet 0  . CRESTOR 10 MG tablet TAKE ONE (1) TABLET BY MOUTH EVERY OTHER DAY (NEED OFFICE VISIT FOR ADDITIONAL REFILLS) 30 tablet 0  . diltiazem (CARDIZEM CD) 240 MG 24 hr capsule TAKE ONE CAPSULE BY MOUTH DAILY 90 capsule 2  . fexofenadine (ALLEGRA) 60 MG tablet Take 1 tablet (60 mg total) by mouth daily. Take 60 mg by mouth daily. 30 tablet 11  . furosemide (LASIX) 40 MG tablet TAKE ONE (1) TABLET BY MOUTH EVERY DAY 30 tablet 9  . HYDROcodone-acetaminophen (NORCO) 10-325 MG per tablet Take 1 tablet by mouth every 6 (six) hours as needed.  0  . irbesartan-hydrochlorothiazide (AVALIDE) 150-12.5 MG tablet TAKE ONE (1) TABLET BY MOUTH EVERY DAY 30 tablet 5  . isosorbide mononitrate (IMDUR) 30 MG 24 hr tablet TAKE ONE (1) TABLET BY MOUTH EVERY DAY 90 tablet 2  . metFORMIN (GLUCOPHAGE-XR) 500 MG 24 hr tablet TAKE ONE (1) TABLET BY MOUTH EVERY DAY WITH BREAKFAST (OFFICE VISIT NEEDED FOR REFILLS) 30 tablet 0  . metoprolol (LOPRESSOR) 100 MG tablet TAKE ONE (1) TABLET BY MOUTH TWO (2) TIMES DAILY 180 tablet 3  . mometasone (NASONEX) 50 MCG/ACT nasal spray Place 2 sprays into the nose daily. 17 g 2  . nitroGLYCERIN (NITROSTAT) 0.4 MG SL tablet Place 1 tablet (0.4 mg total) under the tongue every 5 (five) minutes as needed. 25 tablet 3  . NONFORMULARY OR COMPOUNDED ITEM Allergy Vaccine 1:10 Given at [facility]    . pantoprazole (PROTONIX) 20 MG tablet Take 20 mg by mouth daily.    . sertraline (ZOLOFT) 100 MG tablet Take 2 by mouth once daily 180 tablet 0  . Trospium Chloride 60 MG CP24 TAKE ONE CAPSULE BY MOUTH DAILY 90 each 1  . warfarin (COUMADIN) 7.5 MG tablet TAKE ONE AND ONE-HALF (1 1/2) TO TWO (2)TABLETS BY MOUTH DAILY  AS DIRECTED 165 tablet 1  . [DISCONTINUED] SANCTURA XR 60 MG CP24 Take 1 capsule by mouth daily.     No current facility-administered medications for this visit.    PAST MEDICAL HISTORY: Past Medical History  Diagnosis Date  . Allergic rhinitis   . Asthma   . CAD (coronary artery disease)   . Hypertension   . Sleep apnea   . Diabetes mellitus type II   . Depression   . PTSD (post-traumatic stress disorder)   . 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 Carpal tunnel syndrome, right   . Hyperlipidemia   . Paroxysmal a-fib ([place])   . CAD (coronary artery disease) 01/30/2007    stents trivial coronary artery disease diffusely, with a recent deployment od a intracoronary artery stent, 3.5 x 12 mm driver stent with no more than 20-30% in- stents restenosis.done by Dr [name] with re-look on novenber 10 2008 revealing a widely patent stent with otherwise trival CAD and normal LV function  . Chest pain 07/17/12009    2 D Echo EF >55%  . Obesity   .  Gait instability     PAST SURGICAL HISTORY: Past Surgical History  Procedure Laterality Date  . Cholecystectomy    . Left groin cyst    . Kidney stones    . Stress myocardial perfusion study  09/08/2010    normal; EF 75%  . Multilevel lumbar degenerative disc disease    . Hemorrhoid surgery    . Gallbladder surgery      FAMILY HISTORY: Family History  Problem Relation Age of Onset  . Coronary artery disease Mother   . Coronary artery disease Father   . Depression Father     SOCIAL HISTORY:  Social History   Social History  . Marital Status: Divorced    Spouse Name: N/A  . Number of Children: 2  . Years of Education: 14   Occupational History  . 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 Smoking status: Never Smoker   . Smokeless tobacco: Never Used  . Alcohol Use: No  . Drug Use: No  . Sexual Activity: Not on file   Other Topics Concern  . Not on file   Social History Narrative   Lives at  home alone.   Right-handed.   Occasional caffeine use.     PHYSICAL EXAM   Filed Vitals:   11/19/15 1606  BP: 130/76  Pulse: 73  Height: 5\' 2"  (1.575 m)  Weight: 298 lb 12 oz (135.512 kg)    Not recorded      Body mass index is 54.63 kg/(m^2).  PHYSICAL EXAMNIATION:  Gen: NAD, conversant, well nourised, obese, well groomed                     Cardiovascular: Regular rate rhythm, no peripheral edema, warm, nontender. Eyes: Conjunctivae clear without exudates or hemorrhage Neck: Supple, no carotid bruise. Pulmonary: Clear to auscultation bilaterally   NEUROLOGICAL EXAM:  MENTAL STATUS: Speech:    Speech is normal; fluent and spontaneous with normal comprehension.  Cognition: Depressed looking middle-age female      Orientation to time, place and person     Normal recent and remote memory     Normal Attention span and concentration     Normal Language, naming, repeating,spontaneous speech     Fund of knowledge   CRANIAL NERVES: CN II: Visual fields are full to confrontation. Pupils are round equal and briskly reactive to light. CN III, IV, VI: extraocular movement are normal. No ptosis. CN V: Facial sensation is intact to pinprick in all 3 divisions bilaterally. Corneal responses are intact.  CN VII: Face is symmetric with normal eye closure and smile. CN VIII: Hearing is normal to rubbing fingers CN IX, X: Palate elevates symmetrically. Phonation is normal. CN XI: Head turning and shoulder shrug are intact CN XII: Tongue is midline with normal movements and no atrophy.  MOTOR: Obesity, mild right hip flexion, ankle dorsiflexion weakness   REFLEXES: Reflexes are 3 and symmetric at the biceps, triceps, knees, and ankles. Plantar responses are extensor bilaterally  Sensory: Length dependent decreased to light touch, pinprick and vibration sense to distal shin.   COORDINATION: Rapid alternating movements and fine finger movements are intact. There is no dysmetria  on finger-to-nose and heel-knee-shin.    GAIT/STANCE: She need assistant to get up from seated position, cautious, mildly unsteady, dragging right leg more   DIAGNOSTIC DATA (LABS, IMAGING, TESTING) - I reviewed patient records, labs, notes, testing and imaging myself where available.   ASSESSMENT  AND PLAN  Michele [name] is a 65 y.o. female    Stroke  Small vessel acute stroke  She has a history of paroxysmal atrial fibrillation, already taking Coumadin  ultrasound of carotid artery, echocardiogram  Continue to address vascular risk factor by her primary care physician  Gait difficulty, falling episodes  Multifactorial, this includes obesity, bilateral knee, leg pain, cervical spondylitic myelopathy, extensive periventricular white matter disease, left internal capsule  stroke with mild right-sided weakness  Continue moderate exercise      Marcial Pacas, M.D. Ph.D.  [facility] [address] [place], [place] [address] Ph: [phone] Fax: [phone]  CC: Michele Russian, MD

## 2015-11-25 ENCOUNTER — Encounter: Payer: Self-pay | Admitting: Cardiovascular Disease

## 2015-11-25 ENCOUNTER — Ambulatory Visit (INDEPENDENT_AMBULATORY_CARE_PROVIDER_SITE_OTHER): Payer: Medicare Other | Admitting: Pharmacist

## 2015-11-25 ENCOUNTER — Ambulatory Visit (INDEPENDENT_AMBULATORY_CARE_PROVIDER_SITE_OTHER): Payer: Medicare Other | Admitting: Cardiovascular Disease

## 2015-11-25 ENCOUNTER — Ambulatory Visit (INDEPENDENT_AMBULATORY_CARE_PROVIDER_SITE_OTHER): Payer: No Typology Code available for payment source | Admitting: Psychology

## 2015-11-25 DIAGNOSIS — F330 Major depressive disorder, recurrent, mild: Secondary | ICD-10-CM

## 2015-11-25 DIAGNOSIS — Z7901 Long term (current) use of anticoagulants: Secondary | ICD-10-CM | POA: Diagnosis not present

## 2015-11-25 DIAGNOSIS — I10 Essential (primary) hypertension: Secondary | ICD-10-CM

## 2015-11-25 DIAGNOSIS — I480 Paroxysmal atrial fibrillation: Secondary | ICD-10-CM

## 2015-11-25 DIAGNOSIS — E785 Hyperlipidemia, unspecified: Secondary | ICD-10-CM | POA: Diagnosis not present

## 2015-11-25 DIAGNOSIS — I739 Peripheral vascular disease, unspecified: Secondary | ICD-10-CM

## 2015-11-25 LAB — POCT INR: INR: 1.6

## 2015-11-25 NOTE — Assessment & Plan Note (Signed)
History of hypertension with blood pressure measured at 110/76.She is on Avapro, diltiazem and metoprolol. Continue current meds at current dosing

## 2015-11-25 NOTE — Progress Notes (Signed)
11/25/2015 Michele [name]   11-24-1950  [id number]  Primary Physician [name], MD Primary Cardiologist: Lorretta Harp MD Renae Gloss   HPI:  Michele [name] is a 65 year old.. female with morbidly obese and who has a history of coronary disease status post stent to the proximal RCA in 2008. Her history also includes diabetes mellitus type 2, hypertension, sleep apnea, asthma, paroxysmal intrafibrillation, hyperlipidemia, arthritis. I last saw her in the office 07/24/14. Her last nuclear stress test was 12/20/12 was nonischemic. She has normal LV function.. She has occasional atypical chest pain as well as dyspnea on exertion when walking up an incline.. Since I saw her a year ago she's remained chronically stable without change of her symptoms in either frequency or severity. She does have a history of paroxysmal A. Fib maintaining sinus rhythm on Coumadin anticoagulation. We talked about weight reduction which has been on major problem with her over the years. Unfortunately that she has not been able to lose weight despite seeing a dietitian. Since I saw her over a year ago she's remained clinically stable.   Current Outpatient Prescriptions  Medication Sig Dispense Refill  . ADVAIR DISKUS 250-50 MCG/DOSE AEPB INHALE ONE PUFF TWICE DAILY 60 each 5  . albuterol (PROVENTIL) (2.5 MG/3ML) 0.083% nebulizer solution Take 3 mLs (2.5 mg total) by nebulization every 6 (six) hours as needed. 360 mL 2  . ARIPiprazole (ABILIFY) 5 MG tablet Take 1 tablet (5 mg total) by mouth daily. 90 tablet 0  . aspirin 81 MG tablet Take 81 mg by mouth daily.      . benztropine (COGENTIN) 0.5 MG tablet Take 1 tablet (0.5 mg total) by mouth at bedtime. 90 tablet 0  . diltiazem (CARDIZEM CD) 180 MG 24 hr capsule Take 1 capsule (180 mg total) by mouth daily. 90 capsule 3  . fexofenadine (ALLEGRA) 60 MG tablet Take 1 tablet (60 mg total) by mouth daily. Take 60 mg by mouth daily. 30 tablet 11  .  furosemide (LASIX) 40 MG tablet TAKE ONE (1) TABLET BY MOUTH EVERY DAY 30 tablet 9  . gabapentin (NEURONTIN) 300 MG capsule Take 300 mg by mouth at bedtime.  0  . HYDROcodone-acetaminophen (NORCO) 10-325 MG per tablet Take 1 tablet by mouth every 6 (six) hours as needed.  0  . irbesartan (AVAPRO) 150 MG tablet Take 1 tablet (150 mg total) by mouth daily. 90 tablet 3  . isosorbide mononitrate (IMDUR) 30 MG 24 hr tablet TAKE ONE (1) TABLET BY MOUTH EVERY DAY 90 tablet 2  . metFORMIN (GLUCOPHAGE-XR) 500 MG 24 hr tablet TAKE ONE (1) TABLET BY MOUTH EVERY DAY WITH BREAKFAST 30 tablet 0  . metoprolol (LOPRESSOR) 100 MG tablet TAKE ONE (1) TABLET BY MOUTH TWO (2) TIMES DAILY 180 tablet 0  . mometasone (NASONEX) 50 MCG/ACT nasal spray Place 2 sprays into the nose daily. 17 g 2  . nitroGLYCERIN (NITROSTAT) 0.4 MG SL tablet Place 1 tablet (0.4 mg total) under the tongue every 5 (five) minutes as needed. 25 tablet 3  . pantoprazole (PROTONIX) 20 MG tablet Take 20 mg by mouth daily.    . rosuvastatin (CRESTOR) 10 MG tablet Take 1 tablet (10 mg total) by mouth daily. 30 tablet 1  . sertraline (ZOLOFT) 100 MG tablet Take 2 by mouth once daily 180 tablet 0  . Trospium Chloride 60 MG CP24 TAKE ONE CAPSULE BY MOUTH DAILY 90 each 1  . warfarin (COUMADIN) 7.5 MG tablet TAKE  ONE AND ONE-HALF (1 1/2) TO TWO (2)TABLETS BY MOUTH DAILY AS DIRECTED 165 tablet 1  . [DISCONTINUED] SANCTURA XR 60 MG CP24 Take 1 capsule by mouth daily.     No current facility-administered medications for this visit.    Allergies  Allergen Reactions  . Meprobamate Nausea And Vomiting    Social History   Social History  . Marital Status: Divorced    Spouse Name: N/A  . Number of Children: 2  . Years of Education: 14   Occupational History  . 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 Smoking status: Never Smoker   . Smokeless tobacco: Never Used  . Alcohol Use: No  . Drug Use: No  . Sexual Activity: Not on  file   Other Topics Concern  . Not on file   Social History Narrative   Lives at home alone.   Right-handed.   Occasional caffeine use.     Review of Systems: General: negative for chills, fever, night sweats or weight changes.  Cardiovascular: negative for chest pain, dyspnea on exertion, edema, orthopnea, palpitations, paroxysmal nocturnal dyspnea or shortness of breath Dermatological: negative for rash Respiratory: negative for cough or wheezing Urologic: negative for hematuria Abdominal: negative for nausea, vomiting, diarrhea, bright red blood per rectum, melena, or hematemesis Neurologic: negative for visual changes, syncope, or dizziness All other systems reviewed and are otherwise negative except as noted above.    Blood pressure 110/76, pulse 72, height 5' 2"  (1.575 m), weight 298 lb (135.172 kg).  General appearance: alert and no distress Neck: no adenopathy, no carotid bruit, no JVD, supple, symmetrical, trachea midline and thyroid not enlarged, symmetric, no tenderness/mass/nodules Lungs: clear to auscultation bilaterally Heart: regular rate and rhythm, S1, S2 normal, no murmur, click, rub or gallop Extremities: extremities normal, atraumatic, no cyanosis or edema  EKG normal sinus rhythm at 72 Q wave in lead 3 and reverse R-wave progression consistent with an old anterolateral infarct. Personal review this EKG  ASSESSMENT AND PLAN:   Essential hypertension History of hypertension with blood pressure measured at 110/76.She is on Avapro, diltiazem and metoprolol. Continue current meds at current dosing  Coronary atherosclerosis History of CAD status post RCA stenting in 2008. Her last nuclear stress test performed 12/20/12 was nonischemic. She denies chest pain but does get some dyspnea on exertion.  Paroxysmal a-fib History of paroxysmal atrial fibrillation maintaining sinus rhythm on Coumadin anticoagulation.      Lorretta Harp MD FACP,FACC,FAHA,  [facility] 11/25/2015 4:04 PM

## 2015-11-25 NOTE — Assessment & Plan Note (Signed)
History of CAD status post RCA stenting in 2008. Her last nuclear stress test performed 12/20/12 was nonischemic. She denies chest pain but does get some dyspnea on exertion.

## 2015-11-25 NOTE — Assessment & Plan Note (Signed)
History of paroxysmal atrial fibrillation maintaining sinus rhythm on Coumadin anticoagulation.

## 2015-11-25 NOTE — Patient Instructions (Signed)
Medication Instructions:  Your physician recommends that you continue on your current medications as directed. Please refer to the Current Medication list given to you today.   Labwork: Your physician recommends that you return for lab work AT [facility]. The lab can be found on the FIRST FLOOR of out building in [address]   Testing/Procedures: NONE  Follow-Up: Your physician wants you to follow-up in: [place]. You will receive a reminder letter in the mail two months in advance. If you don't receive a letter, please call our office to schedule the follow-up appointment.   Any Other Special Instructions Will Be Listed Below (If Applicable).     If you need a refill on your cardiac medications before your next appointment, please call your pharmacy.

## 2015-11-25 NOTE — Progress Notes (Signed)
   THERAPIST PROGRESS NOTE  Session Time: 11am-11.50am  Participation Level: Active  Behavioral Response: Well GroomedAlertTired and missing family  Type of Therapy: Individual Therapy  Treatment Goals addressed: Diagnosis: MDD and goal 1  Interventions: CBT and Supportive  Summary: Michele [name] is a 65 y.o. female who presents with affect sullen.  Pt reported that she feels tired as didn't sleep well last night.  Pt reported that her son called stating missing her and wanting her to visit and her granddaughter called discussing her upcoming birthday.  Pt reported that on her mind was missing family- wanting to be present to them and feeling some guilt that couldn't.  Pt was able to identify her negative thought patterns w/ counselor assistance and discuss facts and challenge beliefs.  Pt able to recognize that her children expectations are unrealistic at times but doesn't make her bad mom.  Pt discussed also that she is continue w/ counseling for several years but not sure that she is improving anymore and is going to consider whether counseling is continuing to benefit.     Suicidal/Homicidal: Nowithout intent/plan  Therapist Response: Assessed pt current functioning per pt report.  Explored w/pt her thoughts and feelings and connections of this.  Assisted pt in challenging negative beliefs and distortions.  Discussed w/ pt her tx- expectations of tx and whether counseling is continuing to benefit pt or if pt is coping effectively w/ using her skills and support.  Plan: Return again in 2-3 weeks.  Diagnosis: MDD    [name], [facility] 11/25/2015

## 2015-11-26 ENCOUNTER — Ambulatory Visit (INDEPENDENT_AMBULATORY_CARE_PROVIDER_SITE_OTHER): Payer: Medicare Other

## 2015-11-26 DIAGNOSIS — M4802 Spinal stenosis, cervical region: Secondary | ICD-10-CM

## 2015-11-26 DIAGNOSIS — I6330 Cerebral infarction due to thrombosis of unspecified cerebral artery: Secondary | ICD-10-CM

## 2015-11-26 DIAGNOSIS — R269 Unspecified abnormalities of gait and mobility: Secondary | ICD-10-CM

## 2015-11-27 ENCOUNTER — Ambulatory Visit (INDEPENDENT_AMBULATORY_CARE_PROVIDER_SITE_OTHER): Payer: Medicare Other | Admitting: *Deleted

## 2015-11-27 DIAGNOSIS — M546 Pain in thoracic spine: Secondary | ICD-10-CM | POA: Diagnosis not present

## 2015-11-27 DIAGNOSIS — Z79891 Long term (current) use of opiate analgesic: Secondary | ICD-10-CM | POA: Diagnosis not present

## 2015-11-27 DIAGNOSIS — J309 Allergic rhinitis, unspecified: Secondary | ICD-10-CM

## 2015-11-27 DIAGNOSIS — M5136 Other intervertebral disc degeneration, lumbar region: Secondary | ICD-10-CM | POA: Diagnosis not present

## 2015-11-27 DIAGNOSIS — G894 Chronic pain syndrome: Secondary | ICD-10-CM | POA: Diagnosis not present

## 2015-11-27 LAB — HEPATIC FUNCTION PANEL
ALK PHOS: 93 U/L (ref 33–130)
ALT: 19 U/L (ref 6–29)
AST: 16 U/L (ref 10–35)
Albumin: 4.3 g/dL (ref 3.6–5.1)
BILIRUBIN DIRECT: 0.1 mg/dL (ref <=0.2)
BILIRUBIN INDIRECT: 0.4 mg/dL (ref 0.2–1.2)
BILIRUBIN TOTAL: 0.5 mg/dL (ref 0.2–1.2)
Total Protein: 7.6 g/dL (ref 6.1–8.1)

## 2015-11-27 LAB — LIPID PANEL
CHOL/HDL RATIO: 2.7 ratio (ref <=5.0)
CHOLESTEROL: 150 mg/dL (ref 125–200)
HDL: 55 mg/dL (ref >=46)
LDL CALC: 69 mg/dL (ref <130)
Triglycerides: 130 mg/dL (ref <150)
VLDL: 26 mg/dL (ref <30)

## 2015-11-28 ENCOUNTER — Other Ambulatory Visit: Payer: Self-pay | Admitting: Cardiovascular Disease

## 2015-11-28 ENCOUNTER — Other Ambulatory Visit: Payer: Self-pay | Admitting: Emergency Medicine

## 2015-12-04 ENCOUNTER — Ambulatory Visit (INDEPENDENT_AMBULATORY_CARE_PROVIDER_SITE_OTHER): Payer: Medicare Other | Admitting: *Deleted

## 2015-12-04 ENCOUNTER — Encounter: Payer: Self-pay | Admitting: Internal Medicine

## 2015-12-04 DIAGNOSIS — J309 Allergic rhinitis, unspecified: Secondary | ICD-10-CM | POA: Diagnosis not present

## 2015-12-05 ENCOUNTER — Other Ambulatory Visit: Payer: Self-pay

## 2015-12-05 ENCOUNTER — Ambulatory Visit (HOSPITAL_COMMUNITY): Payer: Medicare Other | Attending: Cardiology

## 2015-12-05 DIAGNOSIS — Z6841 Body Mass Index (BMI) 40.0 and over, adult: Secondary | ICD-10-CM | POA: Insufficient documentation

## 2015-12-05 DIAGNOSIS — M4802 Spinal stenosis, cervical region: Secondary | ICD-10-CM | POA: Insufficient documentation

## 2015-12-05 DIAGNOSIS — R269 Unspecified abnormalities of gait and mobility: Secondary | ICD-10-CM

## 2015-12-05 DIAGNOSIS — E785 Hyperlipidemia, unspecified: Secondary | ICD-10-CM | POA: Diagnosis not present

## 2015-12-05 DIAGNOSIS — I6330 Cerebral infarction due to thrombosis of unspecified cerebral artery: Secondary | ICD-10-CM | POA: Insufficient documentation

## 2015-12-05 DIAGNOSIS — E119 Type 2 diabetes mellitus without complications: Secondary | ICD-10-CM | POA: Diagnosis not present

## 2015-12-08 ENCOUNTER — Telehealth: Payer: Self-pay | Admitting: Neurology

## 2015-12-08 NOTE — Telephone Encounter (Signed)
Spoke to patient she is aware of results

## 2015-12-08 NOTE — Telephone Encounter (Signed)
Please call patient, echocardiogram showed no significant abnormality. 

## 2015-12-09 ENCOUNTER — Ambulatory Visit (INDEPENDENT_AMBULATORY_CARE_PROVIDER_SITE_OTHER): Payer: No Typology Code available for payment source | Admitting: Psychology

## 2015-12-09 ENCOUNTER — Ambulatory Visit (INDEPENDENT_AMBULATORY_CARE_PROVIDER_SITE_OTHER): Payer: Medicare Other | Admitting: Pharmacist

## 2015-12-09 DIAGNOSIS — Z7901 Long term (current) use of anticoagulants: Secondary | ICD-10-CM

## 2015-12-09 DIAGNOSIS — F331 Major depressive disorder, recurrent, moderate: Secondary | ICD-10-CM | POA: Diagnosis not present

## 2015-12-09 DIAGNOSIS — I480 Paroxysmal atrial fibrillation: Secondary | ICD-10-CM

## 2015-12-09 LAB — POCT INR: INR: 1.7

## 2015-12-09 NOTE — Progress Notes (Signed)
   THERAPIST PROGRESS NOTE  Session Time: 10.57am-11.45am  Participation Level: Active  Behavioral Response: Well GroomedAlertDepressed  Type of Therapy: Individual Therapy  Treatment Goals addressed: Diagnosis: MDD and goal 1  Interventions: CBT, Solution Focused and Supportive  Summary: Michele [name] is a 65 y.o. female who presents with depressed mood and affect.  Pt does report that she does want to continue w/ counseling- necessary for her improvement and stability.  Pt reported she has been feeling down and overwhelmed. Pt reported she is receiving a lot of mail and not knowing if it is things she needs to fill out and return or not.  Pt reports she is getting together w/ her daughter to look over today.  Pt expressed I feel like a burden to her.  Pt was able to identify as negative self talk and that not helpful statement for self and daughter is willing to be her support.  Pt discussed that she also feels very tired easily.  Pt also concerned about her memory and not being able to understand things like used to. Pt reported that her neurologist wrote an order for water aerobics.  Pt reported that financial she can't afford and struggles w/ motivation to get up and go. Pt acknowledged that she needs to make contact w/ Y about financial assistance and schedule on her calendar to do this on day not a lot happening.  Pt agreed to f/u on this prior to next appointment.    Suicidal/Homicidal: Nowithout intent/plan  Therapist Response: Assessed pt current functioning per pt report.  Processed w/pt her mood and explored w/pt contributing factors.  Explored w/pt her negative self talk and how to challenge and reframe.  Discussed w/pt concrete steps to take towards reviewing paperwork w/ daughter and getting enrolled in water aerobics.  Assisted pt w/ meeting her needs for not overscheduling in one day and use reminders and calendar to assist.   Plan: Return again in 2  weeks.  Diagnosis: MDD    [name], [facility] 12/09/2015

## 2015-12-11 ENCOUNTER — Ambulatory Visit: Payer: Self-pay

## 2015-12-18 ENCOUNTER — Ambulatory Visit (INDEPENDENT_AMBULATORY_CARE_PROVIDER_SITE_OTHER): Payer: Medicare Other | Admitting: *Deleted

## 2015-12-18 DIAGNOSIS — J309 Allergic rhinitis, unspecified: Secondary | ICD-10-CM | POA: Diagnosis not present

## 2015-12-23 ENCOUNTER — Ambulatory Visit (INDEPENDENT_AMBULATORY_CARE_PROVIDER_SITE_OTHER): Payer: Medicare Other | Admitting: Pharmacist Clinician (PhC)/ Clinical Pharmacy Specialist

## 2015-12-23 DIAGNOSIS — Z7901 Long term (current) use of anticoagulants: Secondary | ICD-10-CM

## 2015-12-23 DIAGNOSIS — I480 Paroxysmal atrial fibrillation: Secondary | ICD-10-CM

## 2015-12-23 LAB — POCT INR: INR: 1.4

## 2015-12-25 ENCOUNTER — Ambulatory Visit (INDEPENDENT_AMBULATORY_CARE_PROVIDER_SITE_OTHER): Payer: Medicare Other | Admitting: *Deleted

## 2015-12-25 DIAGNOSIS — J309 Allergic rhinitis, unspecified: Secondary | ICD-10-CM | POA: Diagnosis not present

## 2015-12-31 ENCOUNTER — Ambulatory Visit (INDEPENDENT_AMBULATORY_CARE_PROVIDER_SITE_OTHER): Payer: No Typology Code available for payment source | Admitting: Psychology

## 2015-12-31 DIAGNOSIS — F330 Major depressive disorder, recurrent, mild: Secondary | ICD-10-CM | POA: Diagnosis not present

## 2015-12-31 NOTE — Progress Notes (Signed)
   THERAPIST PROGRESS NOTE  Session Time: 10.55am-11.38am  Participation Level: Active  Behavioral Response: Well GroomedAlertaffect WNL  Type of Therapy: Individual Therapy  Treatment Goals addressed: Diagnosis: MDD and goal 1  Interventions: CBT and Supportive  Summary: Michele [name] is a 65 y.o. female who presents with affect WNL.  Pt does report struggled last week w/ energy level- had several doctor appointments last week.  Pt reported some down days.  Pt reported that he sister concerned that she isn't making further progress and needs more coping skills. Pt reports that she feels that she has learned coping skills-some days don't work as well as others.  Pt reported that she also feels that family has unrealistic expectations about her ability to do things for self around the house- pt struggles w/ household duties- because of pain and gets easily exhausted.  Pt reported she did get paperwork for application to the [facility].  Pt reports that she wants to wait and join after back from her vacation to visit family in [place]. Pt increased awareness to not avoid and procrastinate on and that can begin to fill out application and need for financial assistance.     Suicidal/Homicidal: Nowithout intent/plan  Therapist Response: Assessed pt current functioning per pt report.  Processed w/pt her mood and discussed pt report vs. Family expectations.  Discussed pt f/u on engaging in increased activity level and how this can be beneficial to mood as well.  Encouraged pt to not avoid or put off and to begin further steps prior to her vacation.   Plan: Return again in 2 weeks or when returns from vacation.  Diagnosis: MDD, mild    [name],[name], LPC 12/31/2015

## 2016-01-01 ENCOUNTER — Ambulatory Visit (INDEPENDENT_AMBULATORY_CARE_PROVIDER_SITE_OTHER): Payer: Medicare Other | Admitting: *Deleted

## 2016-01-01 DIAGNOSIS — J309 Allergic rhinitis, unspecified: Secondary | ICD-10-CM | POA: Diagnosis not present

## 2016-01-02 ENCOUNTER — Telehealth: Payer: Self-pay | Admitting: Internal Medicine

## 2016-01-02 NOTE — Telephone Encounter (Signed)
s.w. pt and r/s appt....per pt request

## 2016-01-05 ENCOUNTER — Other Ambulatory Visit: Payer: Self-pay | Admitting: Medical Oncology

## 2016-01-05 ENCOUNTER — Other Ambulatory Visit (HOSPITAL_BASED_OUTPATIENT_CLINIC_OR_DEPARTMENT_OTHER): Payer: Medicare Other

## 2016-01-05 ENCOUNTER — Ambulatory Visit: Payer: Self-pay | Admitting: Internal Medicine

## 2016-01-05 DIAGNOSIS — D509 Iron deficiency anemia, unspecified: Secondary | ICD-10-CM

## 2016-01-05 LAB — IRON AND TIBC
%SAT: 22 % (ref 21–57)
Iron: 56 ug/dL (ref 41–142)
TIBC: 259 ug/dL (ref 236–444)
UIBC: 203 ug/dL (ref 120–384)

## 2016-01-05 LAB — CBC WITH DIFFERENTIAL/PLATELET
BASO%: 1.3 % (ref 0.0–2.0)
Basophils Absolute: 0.1 10*3/uL (ref 0.0–0.1)
EOS ABS: 0.4 10*3/uL (ref 0.0–0.5)
EOS%: 5.6 % (ref 0.0–7.0)
HCT: 34.1 % — ABNORMAL LOW (ref 34.8–46.6)
HGB: 10.4 g/dL — ABNORMAL LOW (ref 11.6–15.9)
LYMPH%: 42.1 % (ref 14.0–49.7)
MCH: 20.9 pg — AB (ref 25.1–34.0)
MCHC: 30.6 g/dL — AB (ref 31.5–36.0)
MCV: 68.5 fL — AB (ref 79.5–101.0)
MONO#: 0.6 10*3/uL (ref 0.1–0.9)
MONO%: 10.1 % (ref 0.0–14.0)
NEUT#: 2.6 10*3/uL (ref 1.5–6.5)
NEUT%: 40.9 % (ref 38.4–76.8)
PLATELETS: 294 10*3/uL (ref 145–400)
RBC: 4.98 10*6/uL (ref 3.70–5.45)
RDW: 17.6 % — ABNORMAL HIGH (ref 11.2–14.5)
WBC: 6.3 10*3/uL (ref 3.9–10.3)
lymph#: 2.7 10*3/uL (ref 0.9–3.3)

## 2016-01-05 LAB — FERRITIN: FERRITIN: 421 ng/mL — AB (ref 9–269)

## 2016-01-06 ENCOUNTER — Ambulatory Visit: Payer: Self-pay | Admitting: Internal Medicine

## 2016-01-07 ENCOUNTER — Ambulatory Visit (INDEPENDENT_AMBULATORY_CARE_PROVIDER_SITE_OTHER): Payer: Medicare Other | Admitting: Pharmacist Clinician (PhC)/ Clinical Pharmacy Specialist

## 2016-01-07 ENCOUNTER — Ambulatory Visit (HOSPITAL_BASED_OUTPATIENT_CLINIC_OR_DEPARTMENT_OTHER): Payer: Medicare Other | Admitting: Internal Medicine

## 2016-01-07 ENCOUNTER — Encounter: Payer: Self-pay | Admitting: Internal Medicine

## 2016-01-07 ENCOUNTER — Telehealth: Payer: Self-pay | Admitting: Oncology

## 2016-01-07 VITALS — BP 136/65 | HR 74 | Temp 98.9°F | Resp 20 | Ht 62.0 in | Wt 300.7 lb

## 2016-01-07 DIAGNOSIS — D509 Iron deficiency anemia, unspecified: Secondary | ICD-10-CM

## 2016-01-07 DIAGNOSIS — Z7901 Long term (current) use of anticoagulants: Secondary | ICD-10-CM

## 2016-01-07 DIAGNOSIS — I480 Paroxysmal atrial fibrillation: Secondary | ICD-10-CM

## 2016-01-07 LAB — POCT INR: INR: 3.2

## 2016-01-07 NOTE — Telephone Encounter (Signed)
per opf to sch pt appt-gave pt copy of avs

## 2016-01-07 NOTE — Progress Notes (Signed)
[facility] [phone]   [phone]  OFFICE PROGRESS NOTE  [name], MD [address] [place] [address]  DIAGNOSIS: Unspecified anemia questionable for anemia of chronic disease/iron deficiency .   PRIOR THERAPY: None   CURRENT THERAPY: Over-the-counter oral iron tablets. 1 tablets daily 2-3 times a week.  INTERVAL HISTORY: Michele [name] 65 y.o. female returns to the clinic today for six-month followup visit. The patient is feeling fine today with no specific complaints except for arthralgia and fatigue. She is tolerating the oral iron tablets well but she has been off the medication for several weeks. She denied having any bleeding, bruises or ecchymosis. The patient denied having any significant chest pain, shortness of breath, cough or hemoptysis. She denied having any significant weight loss or night sweats. She had repeat CBC and iron study performed recently and she is here for evaluation and discussion of her lab results.   MEDICAL HISTORY: Past Medical History  Diagnosis Date  . Allergic rhinitis   . Asthma   . CAD (coronary artery disease)   . Hypertension   . Sleep apnea   . Diabetes mellitus type II   . Depression   . PTSD (post-traumatic stress disorder)   . 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 Carpal tunnel syndrome, right   . Hyperlipidemia   . Paroxysmal a-fib ([place])   . CAD (coronary artery disease) 01/30/2007    stents trivial coronary artery disease diffusely, with a recent deployment od a intracoronary artery stent, 3.5 x 12 mm driver stent with no more than 20-30% in- stents restenosis.done by Dr [name] with re-look on novenber 10 2008 revealing a widely patent stent with otherwise trival CAD and normal LV function  . Chest pain 07/17/12009    2 D Echo EF >55%  . Obesity   . Gait instability     ALLERGIES:  is allergic to meprobamate.  MEDICATIONS:  Current Outpatient  Prescriptions  Medication Sig Dispense Refill  . ADVAIR DISKUS 250-50 MCG/DOSE AEPB INHALE ONE PUFF TWICE DAILY 60 each 5  . albuterol (PROVENTIL) (2.5 MG/3ML) 0.083% nebulizer solution Take 3 mLs (2.5 mg total) by nebulization every 6 (six) hours as needed. 360 mL 2  . ARIPiprazole (ABILIFY) 5 MG tablet Take 1 tablet (5 mg total) by mouth daily. 90 tablet 0  . aspirin 81 MG tablet Take 81 mg by mouth daily.      . benztropine (COGENTIN) 0.5 MG tablet Take 1 tablet (0.5 mg total) by mouth at bedtime. 90 tablet 0  . diltiazem (CARDIZEM CD) 180 MG 24 hr capsule Take 1 capsule (180 mg total) by mouth daily. 90 capsule 3  . fexofenadine (ALLEGRA) 60 MG tablet Take 1 tablet (60 mg total) by mouth daily. Take 60 mg by mouth daily. 30 tablet 11  . furosemide (LASIX) 40 MG tablet TAKE ONE (1) TABLET BY MOUTH EVERY DAY 30 tablet 9  . gabapentin (NEURONTIN) 300 MG capsule Take 300 mg by mouth at bedtime.  0  . HYDROcodone-acetaminophen (NORCO) 10-325 MG per tablet Take 1 tablet by mouth every 6 (six) hours as needed.  0  . irbesartan (AVAPRO) 150 MG tablet Take 1 tablet (150 mg total) by mouth daily. 90 tablet 3  . isosorbide mononitrate (IMDUR) 30 MG 24 hr tablet TAKE ONE (1) TABLET BY MOUTH EVERY DAY 90 tablet 3  . metFORMIN (GLUCOPHAGE-XR) 500 MG 24 hr tablet TAKE  ONE (1) TABLET BY MOUTH EVERY DAY WITH BREAKFAST 90 tablet 3  . metoprolol (LOPRESSOR) 100 MG tablet TAKE ONE (1) TABLET BY MOUTH TWO (2) TIMES DAILY 180 tablet 0  . mometasone (NASONEX) 50 MCG/ACT nasal spray Place 2 sprays into the nose daily. 17 g 2  . nitroGLYCERIN (NITROSTAT) 0.4 MG SL tablet Place 1 tablet (0.4 mg total) under the tongue every 5 (five) minutes as needed. 25 tablet 3  . pantoprazole (PROTONIX) 20 MG tablet Take 20 mg by mouth daily.    . rosuvastatin (CRESTOR) 10 MG tablet TAKE ONE (1) TABLET BY MOUTH EVERY DAY 90 tablet 3  . sertraline (ZOLOFT) 100 MG tablet Take 2 by mouth once daily 180 tablet 0  . Trospium Chloride  60 MG CP24 TAKE ONE CAPSULE BY MOUTH DAILY 90 each 1  . warfarin (COUMADIN) 7.5 MG tablet TAKE ONE AND ONE-HALF (1 & 1/2) TO TWO (2) TABLETS BY MOUTH DAILY AS DIRECTED 165 tablet 1  . [DISCONTINUED] SANCTURA XR 60 MG CP24 Take 1 capsule by mouth daily.     No current facility-administered medications for this visit.    SURGICAL HISTORY:  Past Surgical History  Procedure Laterality Date  . Cholecystectomy    . Left groin cyst    . Kidney stones    . Stress myocardial perfusion study  09/08/2010    normal; EF 75%  . Multilevel lumbar degenerative disc disease    . Hemorrhoid surgery    . Gallbladder surgery      REVIEW OF SYSTEMS:  A comprehensive review of systems was negative except for: Constitutional: positive for fatigue Musculoskeletal: positive for arthralgias   PHYSICAL EXAMINATION: General appearance: alert, cooperative, fatigued and no distress Head: Normocephalic, without obvious abnormality, atraumatic Neck: no adenopathy, no JVD, supple, symmetrical, trachea midline and thyroid not enlarged, symmetric, no tenderness/mass/nodules Lymph nodes: Cervical, supraclavicular, and axillary nodes normal. Resp: clear to auscultation bilaterally Back: symmetric, no curvature. ROM normal. No CVA tenderness. Cardio: regular rate and rhythm, S1, S2 normal, no murmur, click, rub or gallop GI: soft, non-tender; bowel sounds normal; no masses,  no organomegaly Extremities: extremities normal, atraumatic, no cyanosis or edema  ECOG PERFORMANCE STATUS: 1 - Symptomatic but completely ambulatory  Blood pressure 136/65, pulse 74, temperature 98.9 F (37.2 C), temperature source Oral, resp. rate 20, height 5\' 2"  (1.575 m), weight 300 lb 11.2 oz (136.397 kg), SpO2 97 %.  LABORATORY DATA: Lab Results  Component Value Date   WBC 6.3 01/05/2016   HGB 10.4* 01/05/2016   HCT 34.1* 01/05/2016   MCV 68.5* 01/05/2016   PLT 294 01/05/2016      Chemistry      Component Value Date/Time   NA  139 07/31/2015 1305   NA 144 10/02/2012 0905   K 3.9 07/31/2015 1305   K 3.4* 10/02/2012 0905   CL 97 07/31/2015 1305   CL 101 10/02/2012 0905   CO2 32 07/31/2015 1305   CO2 31* 10/02/2012 0905   BUN 21 07/31/2015 1305   BUN 13.9 10/02/2012 0905   CREATININE 0.83 07/31/2015 1305   CREATININE 0.77 12/08/2014 1153   CREATININE 0.8 10/02/2012 0905      Component Value Date/Time   CALCIUM 10.2 07/31/2015 1305   CALCIUM 9.9 10/02/2012 0905   ALKPHOS 93 11/25/2015 0841   AST 16 11/25/2015 0841   ALT 19 11/25/2015 0841   BILITOT 0.5 11/25/2015 0841     Serum ferritin 421, serum iron 56, total iron binding capacity 259  and iron sat 22%. RADIOGRAPHIC STUDIES: No results found.  ASSESSMENT AND PLAN: This is a very pleasant 65 years old [place] American female with history of persistent microcytic anemia questionable for anemia of chronic disease plus/minus iron deficiency.  CBC today showed persistent anemia with normal iron study. I recommended for the patient to resume the oral iron tablets for now. She would come back for followup visit in 6 months with repeat CBC and iron study. She was advised to call immediately if she has any concerning symptoms in the interval.  The patient voices understanding of current disease status and treatment options and is in agreement with the current care plan.  All questions were answered. The patient knows to call the clinic with any problems, questions or concerns. We can certainly see the patient much sooner if necessary.  Disclaimer: This note was dictated with voice recognition software. Similar sounding words can inadvertently be transcribed and may not be corrected upon review.

## 2016-01-08 ENCOUNTER — Ambulatory Visit: Payer: Self-pay

## 2016-01-08 ENCOUNTER — Encounter: Payer: Self-pay | Admitting: Internal Medicine

## 2016-01-08 ENCOUNTER — Ambulatory Visit (INDEPENDENT_AMBULATORY_CARE_PROVIDER_SITE_OTHER): Payer: Medicare Other | Admitting: *Deleted

## 2016-01-08 ENCOUNTER — Ambulatory Visit (INDEPENDENT_AMBULATORY_CARE_PROVIDER_SITE_OTHER): Payer: Medicare Other | Admitting: Internal Medicine

## 2016-01-08 VITALS — BP 124/70 | HR 85 | Ht 62.0 in | Wt 302.2 lb

## 2016-01-08 DIAGNOSIS — J45998 Other asthma: Secondary | ICD-10-CM | POA: Diagnosis not present

## 2016-01-08 DIAGNOSIS — G4733 Obstructive sleep apnea (adult) (pediatric): Secondary | ICD-10-CM

## 2016-01-08 DIAGNOSIS — J309 Allergic rhinitis, unspecified: Secondary | ICD-10-CM | POA: Diagnosis not present

## 2016-01-08 DIAGNOSIS — J302 Other seasonal allergic rhinitis: Secondary | ICD-10-CM

## 2016-01-08 DIAGNOSIS — J3089 Other allergic rhinitis: Principal | ICD-10-CM
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
## 2016-01-08 NOTE — Patient Instructions (Signed)
We can continue allergy shots through this year, stopping at any time, as discussed  Ok to continue present meds  I think returning to water aerobics is a really great idea

## 2016-01-10 NOTE — Assessment & Plan Note (Signed)
The allergy clinic at this office will be closing in about a year. I discussed this with her. I suggested she stop allergy vaccine at any point and watch to see how she does, using antihistamines and nasal spray, before assuming that she needs to restart allergy vaccine right away.

## 2016-01-10 NOTE — Assessment & Plan Note (Signed)
She was not compliant with CPAP and not motivated to explore alternatives. We discussed oral appliances.

## 2016-01-10 NOTE — Assessment & Plan Note (Signed)
Mild persistent well-controlled. Her dyspnea seems entirely appropriate for her morbid obesity and deconditioning. We reviewed her medications. Certainly encourage resumption of water aerobics and a meaningful effort at weight loss.

## 2016-01-15 ENCOUNTER — Ambulatory Visit: Payer: Self-pay

## 2016-01-21 ENCOUNTER — Telehealth (HOSPITAL_COMMUNITY): Payer: Self-pay | Admitting: Psychology

## 2016-01-21 ENCOUNTER — Encounter (HOSPITAL_COMMUNITY): Payer: Self-pay | Admitting: Psychology

## 2016-01-21 ENCOUNTER — Ambulatory Visit (HOSPITAL_COMMUNITY): Payer: Self-pay | Admitting: Psychology

## 2016-01-21 NOTE — Telephone Encounter (Signed)
Called to pt to f/u re: no show for appointment today.  Another individual answered the phone and indicated right number but still out of town.  Informed I would send a letter to f/u w/ pt.

## 2016-01-26 ENCOUNTER — Other Ambulatory Visit: Payer: Self-pay

## 2016-01-29 ENCOUNTER — Ambulatory Visit (INDEPENDENT_AMBULATORY_CARE_PROVIDER_SITE_OTHER): Payer: Medicare Other | Admitting: Pharmacist

## 2016-01-29 DIAGNOSIS — Z7901 Long term (current) use of anticoagulants: Secondary | ICD-10-CM | POA: Diagnosis not present

## 2016-01-29 DIAGNOSIS — I480 Paroxysmal atrial fibrillation: Secondary | ICD-10-CM | POA: Diagnosis not present

## 2016-01-29 LAB — POCT INR: INR: 3.7

## 2016-02-02 ENCOUNTER — Other Ambulatory Visit (HOSPITAL_COMMUNITY): Payer: Self-pay | Admitting: Psychiatry

## 2016-02-02 ENCOUNTER — Ambulatory Visit: Payer: Self-pay | Admitting: Internal Medicine

## 2016-02-03 ENCOUNTER — Ambulatory Visit (INDEPENDENT_AMBULATORY_CARE_PROVIDER_SITE_OTHER): Payer: No Typology Code available for payment source | Admitting: Psychology

## 2016-02-03 DIAGNOSIS — F330 Major depressive disorder, recurrent, mild: Secondary | ICD-10-CM
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
## 2016-02-12 ENCOUNTER — Ambulatory Visit (INDEPENDENT_AMBULATORY_CARE_PROVIDER_SITE_OTHER): Payer: Medicare Other | Admitting: *Deleted

## 2016-02-12 ENCOUNTER — Ambulatory Visit (INDEPENDENT_AMBULATORY_CARE_PROVIDER_SITE_OTHER): Payer: Medicare Other | Admitting: Pharmacist

## 2016-02-12 DIAGNOSIS — Z7901 Long term (current) use of anticoagulants: Secondary | ICD-10-CM

## 2016-02-12 DIAGNOSIS — I480 Paroxysmal atrial fibrillation: Secondary | ICD-10-CM | POA: Diagnosis not present

## 2016-02-12 DIAGNOSIS — J309 Allergic rhinitis, unspecified: Secondary | ICD-10-CM

## 2016-02-12 LAB — POCT INR: INR: 1.7

## 2016-02-18 ENCOUNTER — Ambulatory Visit (HOSPITAL_COMMUNITY): Payer: Self-pay | Admitting: Psychiatry

## 2016-02-19 ENCOUNTER — Ambulatory Visit (INDEPENDENT_AMBULATORY_CARE_PROVIDER_SITE_OTHER): Payer: Medicare Other | Admitting: *Deleted

## 2016-02-19 DIAGNOSIS — J309 Allergic rhinitis, unspecified: Secondary | ICD-10-CM | POA: Diagnosis not present

## 2016-02-25 ENCOUNTER — Other Ambulatory Visit: Payer: Self-pay | Admitting: Cardiovascular Disease

## 2016-02-25 ENCOUNTER — Ambulatory Visit (INDEPENDENT_AMBULATORY_CARE_PROVIDER_SITE_OTHER): Payer: No Typology Code available for payment source | Admitting: Psychology

## 2016-02-25 ENCOUNTER — Other Ambulatory Visit: Payer: Self-pay | Admitting: Internal Medicine

## 2016-02-25 DIAGNOSIS — F329 Major depressive disorder, single episode, unspecified: Secondary | ICD-10-CM | POA: Diagnosis not present

## 2016-02-25 DIAGNOSIS — F330 Major depressive disorder, recurrent, mild: Secondary | ICD-10-CM | POA: Diagnosis not present

## 2016-02-25 DIAGNOSIS — I4891 Unspecified atrial fibrillation: Secondary | ICD-10-CM | POA: Diagnosis not present

## 2016-02-25 DIAGNOSIS — E785 Hyperlipidemia, unspecified: Secondary | ICD-10-CM | POA: Diagnosis not present

## 2016-02-25 DIAGNOSIS — I10 Essential (primary) hypertension: Secondary | ICD-10-CM | POA: Diagnosis not present
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
## 2016-02-26 ENCOUNTER — Ambulatory Visit (INDEPENDENT_AMBULATORY_CARE_PROVIDER_SITE_OTHER): Payer: Medicare Other | Admitting: *Deleted

## 2016-02-26 ENCOUNTER — Ambulatory Visit (INDEPENDENT_AMBULATORY_CARE_PROVIDER_SITE_OTHER): Payer: Medicare Other | Admitting: Pharmacist Clinician (PhC)/ Clinical Pharmacy Specialist

## 2016-02-26 DIAGNOSIS — I480 Paroxysmal atrial fibrillation: Secondary | ICD-10-CM | POA: Diagnosis not present

## 2016-02-26 DIAGNOSIS — J309 Allergic rhinitis, unspecified: Secondary | ICD-10-CM

## 2016-02-26 DIAGNOSIS — Z7901 Long term (current) use of anticoagulants: Secondary | ICD-10-CM | POA: Diagnosis not present

## 2016-02-26 LAB — POCT INR: INR: 1.6

## 2016-02-26 NOTE — Telephone Encounter (Signed)
Rx request sent to pharmacy.  

## 2016-02-26 NOTE — Progress Notes (Signed)
Had discussion about possibility of using Xarelto, as patient has problems maintaining stable INR (and admits to missed doses off/on).    Will send rx to pharmacy for Xarelto 20 mg so patient can determine pricing.  She knows not to switch to Xarelto until after her next INR appointment.

## 2016-03-04 ENCOUNTER — Ambulatory Visit (INDEPENDENT_AMBULATORY_CARE_PROVIDER_SITE_OTHER): Payer: Medicare Other | Admitting: *Deleted

## 2016-03-04 DIAGNOSIS — J309 Allergic rhinitis, unspecified: Secondary | ICD-10-CM

## 2016-03-09 ENCOUNTER — Ambulatory Visit (HOSPITAL_COMMUNITY): Payer: Self-pay | Admitting: Psychiatry

## 2016-03-10 ENCOUNTER — Ambulatory Visit (INDEPENDENT_AMBULATORY_CARE_PROVIDER_SITE_OTHER): Payer: No Typology Code available for payment source | Admitting: Psychology

## 2016-03-10 DIAGNOSIS — F330 Major depressive disorder, recurrent, mild: Secondary | ICD-10-CM | POA: Diagnosis not present
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
## 2016-03-11 ENCOUNTER — Ambulatory Visit (INDEPENDENT_AMBULATORY_CARE_PROVIDER_SITE_OTHER): Payer: Medicare Other | Admitting: Pharmacist

## 2016-03-11 ENCOUNTER — Ambulatory Visit (INDEPENDENT_AMBULATORY_CARE_PROVIDER_SITE_OTHER): Payer: Medicare Other | Admitting: *Deleted

## 2016-03-11 DIAGNOSIS — J309 Allergic rhinitis, unspecified: Secondary | ICD-10-CM

## 2016-03-11 DIAGNOSIS — Z7901 Long term (current) use of anticoagulants: Secondary | ICD-10-CM | POA: Diagnosis not present

## 2016-03-11 DIAGNOSIS — I480 Paroxysmal atrial fibrillation: Secondary | ICD-10-CM | POA: Diagnosis not present

## 2016-03-11 LAB — POCT INR: INR: 2.6

## 2016-03-16 ENCOUNTER — Other Ambulatory Visit (INDEPENDENT_AMBULATORY_CARE_PROVIDER_SITE_OTHER): Payer: Medicare Other

## 2016-03-16 ENCOUNTER — Encounter: Payer: Self-pay | Admitting: Adult Health

## 2016-03-16 ENCOUNTER — Ambulatory Visit (INDEPENDENT_AMBULATORY_CARE_PROVIDER_SITE_OTHER)
Admission: RE | Admit: 2016-03-16 | Discharge: 2016-03-16 | Disposition: A | Discharge status: Home / Self Care | Payer: Medicare Other | Source: Ambulatory Visit | Attending: Adult Health | Admitting: Adult Health

## 2016-03-16 ENCOUNTER — Ambulatory Visit (INDEPENDENT_AMBULATORY_CARE_PROVIDER_SITE_OTHER): Payer: Medicare Other | Admitting: Adult Health

## 2016-03-16 DIAGNOSIS — J45998 Other asthma: Secondary | ICD-10-CM

## 2016-03-16 DIAGNOSIS — R05 Cough: Secondary | ICD-10-CM | POA: Diagnosis not present

## 2016-03-16 DIAGNOSIS — R0602 Shortness of breath: Secondary | ICD-10-CM | POA: Diagnosis not present

## 2016-03-16 LAB — CBC WITH DIFFERENTIAL/PLATELET
Basophils Absolute: 0 K/uL (ref 0.0–0.1)
Basophils Relative: 0.4 % (ref 0.0–3.0)
Eosinophils Absolute: 0.2 K/uL (ref 0.0–0.7)
Eosinophils Relative: 2.5 % (ref 0.0–5.0)
HCT: 30.7 % — ABNORMAL LOW (ref 36.0–46.0)
Hemoglobin: 9.9 g/dL — ABNORMAL LOW (ref 12.0–15.0)
Lymphocytes Relative: 23.2 % (ref 12.0–46.0)
Lymphs Abs: 1.8 K/uL (ref 0.7–4.0)
MCHC: 32.1 g/dL (ref 30.0–36.0)
MCV: 67.4 fl — ABNORMAL LOW (ref 78.0–100.0)
Monocytes Absolute: 0.5 K/uL (ref 0.1–1.0)
Monocytes Relative: 6.4 % (ref 3.0–12.0)
Neutro Abs: 5.2 K/uL (ref 1.4–7.7)
Neutrophils Relative %: 67.5 % (ref 43.0–77.0)
Platelets: 354 K/uL (ref 150.0–400.0)
RBC: 4.55 Mil/uL (ref 3.87–5.11)
RDW: 18.7 % — ABNORMAL HIGH (ref 11.5–15.5)
WBC: 7.7 K/uL (ref 4.0–10.5)

## 2016-03-16 LAB — BRAIN NATRIURETIC PEPTIDE: Pro B Natriuretic peptide (BNP): 197 pg/mL — ABNORMAL HIGH (ref 0.0–100.0)

## 2016-03-16 MED ORDER — AZITHROMYCIN 250 MG PO TABS: ORAL_TABLET | ORAL | 0 refills | Status: AC

## 2016-03-16 MED ORDER — METHYLPREDNISOLONE ACETATE 80 MG/ML IJ SUSP
80.0000 mg | Freq: Once | INTRAMUSCULAR | Status: AC
  Administered 2016-03-16: 80 mg via INTRAMUSCULAR

## 2016-03-16 NOTE — Assessment & Plan Note (Signed)
Asthma exacerbation Chest x-ray and labs with CBC and BNP Depo-Medrol 80 mg 1  Plan Z-Pak take as directed Mucinex DM twice daily as needed for cough and congestion Saline nasal spray as needed Labs and chest x-ray today Follow up with Dr. Annamaria Boots in 6-8 weeks and as needed

## 2016-03-16 NOTE — Addendum Note (Signed)
Addended by: Osa Craver on: 03/16/2016 10:37 AM   Modules accepted: Orders

## 2016-03-16 NOTE — Progress Notes (Signed)
LVM for pt to return call

## 2016-03-16 NOTE — Addendum Note (Signed)
Addended by: Parke Poisson E on: 03/16/2016 10:45 AM   Modules accepted: Orders

## 2016-03-16 NOTE — Patient Instructions (Signed)
Z-Pak take as directed Mucinex DM twice daily as needed for cough and congestion Saline nasal spray as needed Labs and chest x-ray today Follow up with Dr. Annamaria Boots in 6-8 weeks and as needed

## 2016-03-16 NOTE — Progress Notes (Signed)
Subjective:    Patient ID: Michele [name], female    DOB: Oct 30, 1950, 65 y.o.   MRN: [id number]  HPI 65 year old female never smoker followed for obstructive sleep apnea with C Pap, allergic rhinitis, asthma,, complicated by obesity, GERD  03/16/2016 acute office visit Patient persists for an acute office visit She complains of intermittent episodes of dyspnea where she pants for her breath for last week, with associated cough and wheezing. No fever. She is coughing up thick mucus that comes and goes. Has drainage that pools in throat. Denies any chest pain, orthopnea, PND, or increased leg swelling She remains on Advair and Nasonex., allegra, and allergy shots weekly.   Has OSA, says she only wears CPAP sometimes. Not very consistent .   Has A fib on Coumadin. Had echo in May 2017 with EF 60-655, gr 1 DD . INR last week 2.6.   Seen by hematology for chronic anemia . She is on iron tabs.   Past Medical History:  Diagnosis Date  . Allergic rhinitis   . 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 Asthma   . CAD (coronary artery disease)   . CAD (coronary artery disease) 01/30/2007   stents trivial coronary artery disease diffusely, with a recent deployment od a intracoronary artery stent, 3.5 x 12 mm driver stent with no more than 20-30% in- stents restenosis.done by Dr [name] with re-look on novenber 10 2008 revealing a widely patent stent with otherwise trival CAD and normal LV function  . Carpal tunnel syndrome, right   . Chest pain 07/17/12009   2 D Echo EF >55%  . Depression   . Diabetes mellitus type II   . Gait instability   . Hyperlipidemia   . Hypertension   . Obesity   . Paroxysmal a-fib ([place])   . PTSD (post-traumatic stress disorder)   . Sleep apnea       Review of Systems Constitutional:   No  weight loss, night sweats,  Fevers, chills,  +fatigue, or  lassitude.  HEENT:   No headaches,  Difficulty swallowing,  Tooth/dental problems, or  Sore throat,                  No sneezing, itching, ear ache,  +nasal congestion, post nasal drip,   CV:  No chest pain,  Orthopnea, PND, swelling in lower extremities, anasarca, dizziness, palpitations, syncope.   GI  No heartburn, indigestion, abdominal pain, nausea, vomiting, diarrhea, change in bowel habits, loss of appetite, bloody stools.   Resp:No chest wall deformity  Skin: no rash or lesions.  GU: no dysuria, change in color of urine, no urgency or frequency.  No flank pain, no hematuria   MS:  No joint pain or swelling.  No decreased range of motion.  No back pain.  Psych:  No change in mood or affect. No depression or anxiety.  No memory loss.         Objective:   Physical Exam Vitals:   03/16/16 0943  BP: 128/70  Pulse: 66  Temp: 98.3 F (36.8 C)  TempSrc: Oral  SpO2: 94%  Weight: (!) 303 lb (137.4 kg)  Height: 5' 2"  (1.575 m)   GEN: A/Ox3; pleasant , NAD, Obese, in wheelchair   HEENT:  [place]/AT,  EACs-clear , TMs-wnl, NOSE-clear drainage THROAT-clear, no lesions, no postnasal drip or exudate noted.   NECK:  Supple w/ fair ROM; no JVD; normal carotid impulses w/o bruits; no thyromegaly or nodules palpated;  no lymphadenopathy.    RESP  trace expiratory wheeze , speaking full sentences,  no accessory muscle use, no dullness to percussion  CARD:  RRR, no m/r/g  , no peripheral edema, pulses intact, no cyanosis or clubbing.  GI:   Soft & nt; nml bowel sounds; no organomegaly or masses detected.   Musco: Warm bil, no deformities or joint swelling noted.   Neuro: alert, no focal deficits noted.    Skin: Warm, no lesions or rashes  Tammy Parrett NP-C  [facility]  03/16/2016

## 2016-03-17 ENCOUNTER — Telehealth: Payer: Self-pay | Admitting: Oncology

## 2016-03-17 NOTE — Telephone Encounter (Signed)
FAXED RECORDS TO DR [facility] RELEASE ID [id number]

## 2016-03-18 ENCOUNTER — Other Ambulatory Visit: Payer: Self-pay | Admitting: Adult Health

## 2016-03-18 ENCOUNTER — Ambulatory Visit (INDEPENDENT_AMBULATORY_CARE_PROVIDER_SITE_OTHER): Payer: Medicare Other

## 2016-03-18 DIAGNOSIS — J309 Allergic rhinitis, unspecified: Secondary | ICD-10-CM

## 2016-03-18 NOTE — Telephone Encounter (Signed)
Spoke with the pharmacist at [facility]  He states coumadin and zithromax have adverse reaction and needs okay for pt to have this filled  Also he states that the pt was under the impression that we were calling in 2 prescriptions for her  Please advise thanks

## 2016-03-18 NOTE — Telephone Encounter (Signed)
Per verbal order from TP Pt will need to contact coumadin clinic for recs.  Called spoke with pharmacist. She states that there is an interaction between the zpak and coumadin. She states that it will effect the effectiveness of the coumadin and was questioning whether an INR is needed prior to starting the zpak. She also states that this was reviewed with the pt and the patient is worried about starting the zpak. I explained to her that the patient will need to contact the coumadin clinic for their recs per verbal order from TP. She voiced understanding and had no further questions. She states she will contact the patient and make her aware. Nothing further needed.

## 2016-03-19 NOTE — Progress Notes (Signed)
LMTCB x2  

## 2016-03-24 ENCOUNTER — Ambulatory Visit (HOSPITAL_COMMUNITY): Payer: Self-pay | Admitting: Psychology

## 2016-03-24 NOTE — Progress Notes (Signed)
lmtcb x3 

## 2016-03-25 NOTE — Progress Notes (Signed)
Called spoke with pt. Reviewed results and recs. Pt voiced understanding and had no further questions.

## 2016-03-26 ENCOUNTER — Telehealth: Payer: Self-pay | Admitting: Internal Medicine

## 2016-03-26 DIAGNOSIS — J309 Allergic rhinitis, unspecified: Secondary | ICD-10-CM | POA: Diagnosis not present

## 2016-03-26 NOTE — Telephone Encounter (Signed)
Allergy Serum Extract Date Mixed: 03/26/2016 Vial: AB Strength: 1:10 Here/Mail/Pick Up: Here Mixed By: Desmond Dike, CMA Last OV: 01/08/2016 Pending OV: 05/04/2016

## 2016-04-01 ENCOUNTER — Ambulatory Visit (INDEPENDENT_AMBULATORY_CARE_PROVIDER_SITE_OTHER): Payer: Medicare Other | Admitting: Pharmacist

## 2016-04-01 DIAGNOSIS — I480 Paroxysmal atrial fibrillation: Secondary | ICD-10-CM | POA: Diagnosis not present

## 2016-04-01 DIAGNOSIS — Z7901 Long term (current) use of anticoagulants: Secondary | ICD-10-CM

## 2016-04-01 LAB — POCT INR: INR: 1.2

## 2016-04-07 ENCOUNTER — Ambulatory Visit (HOSPITAL_COMMUNITY): Payer: Self-pay | Admitting: Psychology

## 2016-04-08 ENCOUNTER — Ambulatory Visit (INDEPENDENT_AMBULATORY_CARE_PROVIDER_SITE_OTHER): Payer: No Typology Code available for payment source | Admitting: Psychology

## 2016-04-08 DIAGNOSIS — F330 Major depressive disorder, recurrent, mild: Secondary | ICD-10-CM | POA: Diagnosis not present

## 2016-04-12 NOTE — Progress Notes (Signed)
   THERAPIST PROGRESS NOTE  Session Time: 1.30pm-2.15pm  Participation Level: Active  Behavioral Response: Well GroomedAlertaffect depressed  Type of Therapy: Individual Therapy  Treatment Goals addressed: Diagnosis: MDD and goal 1.  Interventions: CBT and Supportive  Summary: Michele [name] is a 64 y.o. female who presents with report of some depressed mood.  Pt reported that she really is struggling w/ her mobility more and more and that this limits her.  Pt reported she was dealing w/ increased depressed mood 2 weeks ago- but this has improved.  Pt reports that she struggles w/ how others that care for her are pushing her and expectations seem unrealistic for her.  Pt reported that sometimes daughter seems to be interacting w/ her as her parent.  Pt reports these interactions bother her and that she is attempting to speak up for self that she is doing what she can.  Pt discussed more acceptance herself w/ her limitations and although frustrating- not putting self down.     Suicidal/Homicidal: Nowithout intent/plan  Therapist Response: Assessed pt current functioning per pt report. Processed w/pt her coping w/ limitations- encouraging self  And acceptance for limits.  Discussed stressful interactions w/ her daughter and sister re: their expectations for her.  Encouraged assertive response.   Plan: Return again in 2 weeks.  Diagnosis: MDD    [name], [facility] 04/12/2016

## 2016-04-13 ENCOUNTER — Encounter (HOSPITAL_COMMUNITY): Payer: Self-pay | Admitting: Psychiatry

## 2016-04-13 ENCOUNTER — Ambulatory Visit (INDEPENDENT_AMBULATORY_CARE_PROVIDER_SITE_OTHER): Payer: No Typology Code available for payment source | Admitting: Psychiatry

## 2016-04-13 DIAGNOSIS — F331 Major depressive disorder, recurrent, moderate: Secondary | ICD-10-CM | POA: Diagnosis not present

## 2016-04-13 MED ORDER — BENZTROPINE MESYLATE 0.5 MG PO TABS: 0.5000 mg | ORAL_TABLET | Freq: Every day | ORAL | 0 refills | Status: DC

## 2016-04-13 MED ORDER — ARIPIPRAZOLE 5 MG PO TABS: ORAL_TABLET | ORAL | 0 refills | Status: DC

## 2016-04-13 MED ORDER — SERTRALINE HCL 100 MG PO TABS: ORAL_TABLET | ORAL | 0 refills | Status: DC
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
## 2016-04-15 ENCOUNTER — Ambulatory Visit (INDEPENDENT_AMBULATORY_CARE_PROVIDER_SITE_OTHER): Payer: Medicare Other | Admitting: Pharmacist Clinician (PhC)/ Clinical Pharmacy Specialist

## 2016-04-15 DIAGNOSIS — Z7901 Long term (current) use of anticoagulants: Secondary | ICD-10-CM

## 2016-04-15 DIAGNOSIS — I480 Paroxysmal atrial fibrillation: Secondary | ICD-10-CM

## 2016-04-15 LAB — POCT INR: INR: 5.4

## 2016-04-21 ENCOUNTER — Ambulatory Visit (HOSPITAL_COMMUNITY): Payer: Self-pay | Admitting: Psychology

## 2016-04-21 DIAGNOSIS — Z9861 Coronary angioplasty status: Secondary | ICD-10-CM | POA: Diagnosis not present

## 2016-04-21 DIAGNOSIS — G4730 Sleep apnea, unspecified: Secondary | ICD-10-CM | POA: Diagnosis not present

## 2016-04-21 DIAGNOSIS — T82855S Stenosis of coronary artery stent, sequela: Secondary | ICD-10-CM | POA: Diagnosis not present

## 2016-04-21 DIAGNOSIS — I10 Essential (primary) hypertension: Secondary | ICD-10-CM | POA: Diagnosis not present

## 2016-04-21 DIAGNOSIS — E785 Hyperlipidemia, unspecified: Secondary | ICD-10-CM | POA: Diagnosis not present

## 2016-04-22 ENCOUNTER — Ambulatory Visit (INDEPENDENT_AMBULATORY_CARE_PROVIDER_SITE_OTHER): Payer: Medicare Other

## 2016-04-22 ENCOUNTER — Ambulatory Visit (INDEPENDENT_AMBULATORY_CARE_PROVIDER_SITE_OTHER): Payer: Medicare Other | Admitting: *Deleted

## 2016-04-22 ENCOUNTER — Ambulatory Visit (INDEPENDENT_AMBULATORY_CARE_PROVIDER_SITE_OTHER): Payer: No Typology Code available for payment source | Admitting: Psychology

## 2016-04-22 DIAGNOSIS — Z23 Encounter for immunization: Secondary | ICD-10-CM

## 2016-04-22 DIAGNOSIS — J309 Allergic rhinitis, unspecified: Secondary | ICD-10-CM

## 2016-04-22 DIAGNOSIS — F331 Major depressive disorder, recurrent, moderate: Secondary | ICD-10-CM | POA: Diagnosis not present

## 2016-04-22 NOTE — Progress Notes (Signed)
   THERAPIST PROGRESS NOTE  Session Time: 12.07pm-1pm  Participation Level: Active  Behavioral Response: Well GroomedAlertworried  Type of Therapy: Individual Therapy  Treatment Goals addressed: Diagnosis: MDD and goal 1  Interventions: CBT and Strength-based  Summary: Michele [name] is a 65 y.o. female who presents with affect WNL.  Pt reported that she was thrown off this week about her appointments and embarrassed about showing for appointments earlier this week on wrong day and being very early to today's.  Pt reports she continues to struggle w/ her term memory and keeping track of appointments etc and is working w/ her sister to assist with this.  Pt reported that frustrates but trying to accept as part of her process. Pt did express worry for her sister.  Pt reports that she and sister have become very concerned for her as she has begun not acting like her self, hallucinating and delusions.  Pt discussed how she is supporting her and not taking on her problems to fix but assisting to get tx. Pt discussed the closeness of her family and value she has w/ these relationships.   Suicidal/Homicidal: Nowithout intent/plan  Therapist Response: Assessed pt current functioning per pt report.  Processed w/pt her worry and struggle w/ memory and organization.  Reflected acceptance and not judging self.  Explored w/ pt her worries about her sister and how she is a good support and value of family in her life.   Plan: Return again in 2 weeks.  Diagnosis: MDD    [name], [facility] 04/22/2016

## 2016-04-29 ENCOUNTER — Ambulatory Visit (INDEPENDENT_AMBULATORY_CARE_PROVIDER_SITE_OTHER): Payer: Medicare Other | Admitting: *Deleted

## 2016-04-29 DIAGNOSIS — J309 Allergic rhinitis, unspecified: Secondary | ICD-10-CM

## 2016-04-30 DIAGNOSIS — R238 Other skin changes: Secondary | ICD-10-CM | POA: Diagnosis not present

## 2016-04-30 DIAGNOSIS — S2020XA Contusion of thorax, unspecified, initial encounter: Secondary | ICD-10-CM | POA: Diagnosis not present

## 2016-04-30 DIAGNOSIS — S40021A Contusion of right upper arm, initial encounter: Secondary | ICD-10-CM | POA: Diagnosis not present

## 2016-04-30 DIAGNOSIS — Z7901 Long term (current) use of anticoagulants: Secondary | ICD-10-CM | POA: Diagnosis not present

## 2016-04-30 DIAGNOSIS — R296 Repeated falls: Secondary | ICD-10-CM | POA: Diagnosis not present

## 2016-05-03 ENCOUNTER — Other Ambulatory Visit: Payer: Self-pay | Admitting: Nurse Practitioner

## 2016-05-03 ENCOUNTER — Ambulatory Visit
Admission: RE | Admit: 2016-05-03 | Discharge: 2016-05-03 | Disposition: A | Discharge status: Home / Self Care | Payer: Medicare Other | Source: Ambulatory Visit | Attending: Nurse Practitioner | Admitting: Nurse Practitioner

## 2016-05-03 DIAGNOSIS — W19XXXA Unspecified fall, initial encounter: Secondary | ICD-10-CM

## 2016-05-03 DIAGNOSIS — S20212A Contusion of left front wall of thorax, initial encounter: Secondary | ICD-10-CM | POA: Diagnosis not present

## 2016-05-04 ENCOUNTER — Encounter: Payer: Self-pay | Admitting: Internal Medicine

## 2016-05-04 ENCOUNTER — Ambulatory Visit (INDEPENDENT_AMBULATORY_CARE_PROVIDER_SITE_OTHER): Payer: Medicare Other | Admitting: Internal Medicine

## 2016-05-04 VITALS — BP 124/82 | HR 72 | Ht 62.0 in | Wt 297.4 lb

## 2016-05-04 DIAGNOSIS — J4520 Mild intermittent asthma, uncomplicated: Secondary | ICD-10-CM | POA: Diagnosis not present

## 2016-05-04 DIAGNOSIS — G4733 Obstructive sleep apnea (adult) (pediatric): Secondary | ICD-10-CM

## 2016-05-04 DIAGNOSIS — J3089 Other allergic rhinitis: Secondary | ICD-10-CM | POA: Diagnosis not present

## 2016-05-04 DIAGNOSIS — J302 Other seasonal allergic rhinitis: Secondary | ICD-10-CM

## 2016-05-04 NOTE — Progress Notes (Signed)
Patient ID: Michele [name], female    DOB: 1950-12-10, 65 y.o.   MRN: [id number]  HPI F never smoker. Followed here for obstructive sleep apnea, allergic rhinitis, asthma, complicated by obesity, GERD and CAD.   07/31/2015- ACUTE VISIT: pt came by for allergy injection; was too SOB and weak/drained to walk back to car. Light headed and dizzy spells that stopped with rest for mroe than a few minutes. Pt states the whole left side of head has pressure and ear feels clogged. Pt has recent MRI-head about 2 months; was told she was having small TIA's-followed by Dr Krista Blue. Memory is off more than usual today as well. 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 Occasionally coughs up a little thick clear phlegm but denies sore throat, fever, wheeze, chest pain or palpitation, leg pains. Says left side of head "has a fog in it"  01/08/2016-65 year old female never smoker followed for OSA/failed CPAP, allergic rhinitis, asthma, complicated by obesity, GERD, CAD/stents/warfarin, CVA CPAP auto/[facility]-failed/dropped off Allergy vaccine 1:10 [facility] FOLLOWS FOR: Pt continues to have increased SOB and wheezing with exertion. Still on allergy vaccine and doing well.  Blames humid weather for making her feel little tight in the chest. Dyspnea on exertion with hills and stairs. She recognizes her obesity is part of the problem and plans to restart water aerobics soon.  05/04/2016-65 year old female never smoker followed for OSA/failed CPAP, allergic rhinitis, asthma, Kidney by obesity, GERD, CAD/stents/warfarin/ AFib, CVA, chronic anemia, depression Allergy Vaccine 1:10 GH LOV-NP-03/16/2016 for episodic dyspnea/asthma exacerbation-Z-Pak, Mucinex DM CXR 03/16/2016-Cardiomegaly, NAD X-ray left rib details 05/03/2016-no definite abnormality Lab 03/16/16- hbg 9.9, BNP 197 FOLLOW FOR: allergies not doing well, sob, sneezing, runny nose. CPAP machine very old. Falling more  often and bruising No acute change in her breathing but gets very little exercise.  ROS-see HPI Constitutional:   No-   weight loss, night sweats, fevers, chills, +fatigue, lassitude. HEENT:   No-  headaches, difficulty swallowing, tooth/dental problems, sore throat,       No-sneezing, itching, ear ache, nasal congestion, post nasal drip,  CV:  No-   chest pain, orthopnea, PND, swelling in lower extremities, anasarca, dizziness, palpitations Resp: +shortness of breath with exertion or at rest.            productive cough,  No non-productive cough,  No- coughing up of blood.              No-   change in color of mucus. No- wheezing.   Skin: No-   rash or lesions. GI:  No-   heartburn, indigestion, abdominal pain, nausea, vomiting,  GU:  MS:  No-   joint pain or swelling.   Neuro-     nothing unusual Psych:  No- change in mood or affect. No acute  depression or anxiety.  No memory loss.  Objective:  General- Alert, Oriented, Affect-appropriate, Distress- none acute; + morbidly obese Skin- + Bruises right bicep, left flank with mobile subcutaneous nodule left flank consistent with small hematoma. Lymphadenopathy- none Head- atraumatic            Eyes- Gross vision intact, PERRLA, conjunctivae clear secretions            Ears- Hearing, canals-normal            Nose- +clear, no- Septal dev,  polyps, erosion, perforation             Throat- Mallampati III-IV , mucosa clear , drainage- none, tonsils-  atrophic; raspy voice Neck- flexible , trachea midline, no stridor , thyroid nl, carotid no bruit Chest - symmetrical excursion , unlabored           Heart/CV- RRR- occ extra beat , no murmur , no gallop  , no rub, nl s1 s2                           - JVD- none , edema- none, stasis changes- none, varices- none           Lung- clear, shallow, wheeze- none, cough- none , dullness-none, rub- none           Chest wall-  Abd-  Br/ Gen/ Rectal- Not done, not indicated Extrem- cyanosis- none,  clubbing, none, atrophy- none, strength- nl; +walks with cane Neuro- grossly intact to observation  Assessment & Plan:

## 2016-05-04 NOTE — Assessment & Plan Note (Signed)
She is not making a meaningful weight loss effort. Consider bariatric counseling

## 2016-05-04 NOTE — Patient Instructions (Addendum)
As discussed, we will be closing our allergy program here late this winter. I suggest you wait then and see how you do without allergy shots. If your antihistamine and flonase aren't enough help, then you can go to [facility] of the other allergy practices in town.   Order- [facility]- please replace old CPAP machine auto 5-15, mask of choice, humidifier, AirView    Dx OSA  Please call as needed  Please see Dr Baird Cancer about your falling and bruising on warfarin.

## 2016-05-04 NOTE — Assessment & Plan Note (Signed)
Continues Advair. Using rescue inhaler only once or twice a week currently with no sleep disturbance. Infrequent need for nebulizer machine. Has all these available for winter colds if necessary.

## 2016-05-04 NOTE — Assessment & Plan Note (Signed)
She has Allegra and Flonase available. Encouraged to use them more consistently if she is having some seasonal exacerbation. I explained that we will be ending our allergy program at this office this winter as my practice those down. She should see how she does off of allergy shots but can go to another allergy practice if she finds she needs.

## 2016-05-06 ENCOUNTER — Other Ambulatory Visit: Payer: Self-pay | Admitting: Internal Medicine

## 2016-05-06 ENCOUNTER — Ambulatory Visit (INDEPENDENT_AMBULATORY_CARE_PROVIDER_SITE_OTHER): Payer: Self-pay | Admitting: Psychology

## 2016-05-06 ENCOUNTER — Ambulatory Visit (INDEPENDENT_AMBULATORY_CARE_PROVIDER_SITE_OTHER): Payer: Medicare Other | Admitting: *Deleted

## 2016-05-06 ENCOUNTER — Ambulatory Visit (INDEPENDENT_AMBULATORY_CARE_PROVIDER_SITE_OTHER): Payer: Medicare Other | Admitting: Pharmacist Clinician (PhC)/ Clinical Pharmacy Specialist

## 2016-05-06 DIAGNOSIS — J309 Allergic rhinitis, unspecified: Secondary | ICD-10-CM | POA: Diagnosis not present

## 2016-05-06 DIAGNOSIS — R296 Repeated falls: Secondary | ICD-10-CM | POA: Diagnosis not present

## 2016-05-06 DIAGNOSIS — F331 Major depressive disorder, recurrent, moderate: Secondary | ICD-10-CM

## 2016-05-06 DIAGNOSIS — M25562 Pain in left knee: Secondary | ICD-10-CM | POA: Diagnosis not present

## 2016-05-06 DIAGNOSIS — I480 Paroxysmal atrial fibrillation: Secondary | ICD-10-CM

## 2016-05-06 DIAGNOSIS — Z7901 Long term (current) use of anticoagulants: Secondary | ICD-10-CM | POA: Diagnosis not present

## 2016-05-06 DIAGNOSIS — Z741 Need for assistance with personal care: Secondary | ICD-10-CM | POA: Diagnosis not present

## 2016-05-06 LAB — POCT INR
INR: 1.6
INR: 1.6

## 2016-05-06 NOTE — Progress Notes (Signed)
   THERAPIST PROGRESS NOTE  Session Time: 12.32pm-1.27pm  Participation Level: Active  Behavioral Response: Well GroomedAlertDepressed  Type of Therapy: Individual Therapy  Treatment Goals addressed: Diagnosis: MDd and goal 1.  Interventions: CBT and Supportive  Summary: Michele [name] is a 65 y.o. female who presents with depressed mood.  Pt reported that she has had several falls in the last 2 weeks.  Pt reported 5 falls last week and daughter has become concerned and started talking about "putting her somewhere".  Pt reported that she felt very betrayed and caught off guard that her daughter would be considering this.  Pt discussed how her independence is important to her and it feels as if her family is trying to lock her away.  Pt was able to acknowledge that w/ increased falls- indicates a need for something more and will work w/ her PCP, Dr. Baird Cancer and other medical professionals to determine what is going on and next steps.    Suicidal/Homicidal: Nowithout intent/plan  Therapist Response: Assessed pt current functioning per pt report.  Processed w/ pt impact of daughter's want to look at some type of residential care/assisted living for pt due to concerns w/ falls.  Validated pt concerns and discussed pt asserting her feelings and wants and working w/ family and medical professionals to determine needs and appropriate plan.   Plan: Return again in 2 weeks.  Diagnosis: MDD    [name], [facility] 05/06/2016

## 2016-05-08 DIAGNOSIS — F329 Major depressive disorder, single episode, unspecified: Secondary | ICD-10-CM | POA: Diagnosis not present

## 2016-05-08 DIAGNOSIS — M545 Low back pain: Secondary | ICD-10-CM | POA: Diagnosis not present

## 2016-05-08 DIAGNOSIS — M25562 Pain in left knee: Secondary | ICD-10-CM | POA: Diagnosis not present

## 2016-05-08 DIAGNOSIS — G8929 Other chronic pain: Secondary | ICD-10-CM | POA: Diagnosis not present

## 2016-05-08 DIAGNOSIS — R238 Other skin changes: Secondary | ICD-10-CM | POA: Diagnosis not present

## 2016-05-08 DIAGNOSIS — Z7982 Long term (current) use of aspirin: Secondary | ICD-10-CM | POA: Diagnosis not present

## 2016-05-08 DIAGNOSIS — S20212D Contusion of left front wall of thorax, subsequent encounter: Secondary | ICD-10-CM | POA: Diagnosis not present

## 2016-05-08 DIAGNOSIS — J449 Chronic obstructive pulmonary disease, unspecified: Secondary | ICD-10-CM | POA: Diagnosis not present

## 2016-05-08 DIAGNOSIS — F419 Anxiety disorder, unspecified: Secondary | ICD-10-CM | POA: Diagnosis not present

## 2016-05-08 DIAGNOSIS — R296 Repeated falls: Secondary | ICD-10-CM | POA: Diagnosis not present

## 2016-05-13 ENCOUNTER — Ambulatory Visit (INDEPENDENT_AMBULATORY_CARE_PROVIDER_SITE_OTHER): Payer: Medicare Other | Admitting: *Deleted

## 2016-05-13 DIAGNOSIS — J309 Allergic rhinitis, unspecified: Secondary | ICD-10-CM | POA: Diagnosis not present

## 2016-05-14 DIAGNOSIS — H1851 Endothelial corneal dystrophy: Secondary | ICD-10-CM | POA: Diagnosis not present

## 2016-05-14 DIAGNOSIS — H43813 Vitreous degeneration, bilateral: Secondary | ICD-10-CM | POA: Diagnosis not present

## 2016-05-14 DIAGNOSIS — E119 Type 2 diabetes mellitus without complications: Secondary | ICD-10-CM | POA: Diagnosis not present

## 2016-05-14 DIAGNOSIS — H2513 Age-related nuclear cataract, bilateral: Secondary | ICD-10-CM | POA: Diagnosis not present

## 2016-05-14 DIAGNOSIS — H04123 Dry eye syndrome of bilateral lacrimal glands: Secondary | ICD-10-CM | POA: Diagnosis not present

## 2016-05-19 ENCOUNTER — Ambulatory Visit (INDEPENDENT_AMBULATORY_CARE_PROVIDER_SITE_OTHER): Payer: Self-pay | Admitting: Psychology

## 2016-05-19 DIAGNOSIS — F330 Major depressive disorder, recurrent, mild: Secondary | ICD-10-CM

## 2016-05-19 NOTE — Progress Notes (Signed)
   THERAPIST PROGRESS NOTE  Session Time: 12.32pm-1.24pm  Participation Level: Active  Behavioral Response: Well GroomedAlertaffect WNL  Type of Therapy: Individual Therapy  Treatment Goals addressed: Diagnosis: MDD and goal 1.  Interventions: CBT, Strength-based and Supportive  Summary: Michele [name] is a 65 y.o. female who presents with generally bright affect.  Pt reported that she did see her PCP 2 weeks ago and she assisted in accessing services for a CNA to come 2 times a week, PT to come 2x a week and nurse to check on her. Pt increased awareness that her daughter was scared and her response was to go into business/planning mode.  Pt less discouraged about needing assistance and more accepting that helping to resolve issues and maintain independence. Pt discussed her concern for sister who is still struggling some.  Pt reported no plans for thanksgiving school reunion but discussed value of being part of that community.   Suicidal/Homicidal: Nowithout intent/plan  Therapist Response: Assessed pt current functioning per pt report. Processed w/pt feelings re: in home services that have started and support that gives her towards continued independence.   Plan: Return again in 2 weeks.  Diagnosis: MDD, mild    [name],[name], LPC 05/19/2016

## 2016-05-20 ENCOUNTER — Ambulatory Visit (INDEPENDENT_AMBULATORY_CARE_PROVIDER_SITE_OTHER): Payer: Medicare Other | Admitting: Nurse Practitioner

## 2016-05-20 ENCOUNTER — Ambulatory Visit: Payer: Medicare Other | Admitting: Nurse Practitioner

## 2016-05-20 ENCOUNTER — Encounter: Payer: Self-pay | Admitting: Nurse Practitioner

## 2016-05-20 ENCOUNTER — Telehealth: Payer: Self-pay | Admitting: Internal Medicine

## 2016-05-20 VITALS — BP 108/63 | HR 72 | Ht 62.0 in | Wt 299.8 lb

## 2016-05-20 DIAGNOSIS — I10 Essential (primary) hypertension: Secondary | ICD-10-CM | POA: Diagnosis not present

## 2016-05-20 DIAGNOSIS — I480 Paroxysmal atrial fibrillation: Secondary | ICD-10-CM

## 2016-05-20 DIAGNOSIS — M4802 Spinal stenosis, cervical region: Secondary | ICD-10-CM

## 2016-05-20 DIAGNOSIS — R269 Unspecified abnormalities of gait and mobility: Secondary | ICD-10-CM | POA: Diagnosis not present

## 2016-05-20 DIAGNOSIS — I6330 Cerebral infarction due to thrombosis of unspecified cerebral artery: Secondary | ICD-10-CM | POA: Diagnosis not present

## 2016-05-20 DIAGNOSIS — G4733 Obstructive sleep apnea (adult) (pediatric): Secondary | ICD-10-CM

## 2016-05-20 NOTE — Patient Instructions (Addendum)
Continue Coumadin for secondary stroke prevention and history of paroxysmal atrial fibrillation Continue physical therapy and occupational therapy for strengthening exercises and home exercise program Blood pressure in the office today 108/63 make sure you stay well hydrated Follow-up in 6 months

## 2016-05-20 NOTE — Telephone Encounter (Signed)
Please explain this to patient. Per DME company, she will need to update her sleep study- please schedule a split protocol NPSG     Dx Michele [name]

## 2016-05-20 NOTE — Progress Notes (Signed)
PATIENT: Michele [name] DOB: June 21, 1951  REASON FOR VISIT: Follow-up for spinal stenosis in the cervical region, gait abnormality, history of stroke HISTORY FROM: Patient and daughter Michele [name]    HISTORY OF PRESENT ILLNESS:Michele [name] is a 65 years old right-handed female, accompanied by her sister Michele [name], seen in refer by her primary care physician Dr. [name] in May 12 2015 for evaluation of unsteady gait, falling episodes  She has past medical history of hypertension, hyperlipidemia, borderline diabetes, coronary artery disease, status post a stent, atrial fibrillation, take Coumadin, also under the care of psychologist for PTSD, on polypharmacy, this including Abilify 15 mg daily, Zoloft 100x2 mg daily.  She had gradual onset unsteady gait since 2015, getting worse over the past couple months, fell twice in October 7 and April 27 2015, her right leg gave out, she also complains of right arm weakness, she has anemia, received iron infusion, she also complains of irritable bowel syndrome, she also complains of bowel and bladder incontinence, complains of right arm, flank, right leg pain, bilateral knee pain, bilateral lower extremity deep achy pain,  I have personally reviewed CAT scan April 28 2015: Periventricular small vessel disease, no acute lesions.  UPDATE May 22 2015: She came with her daughter at today's clinical visit, walking with a cane.  She is most frustrated by her frequent falling episode, mild headaches, bilateral knee pain.  She also complain of bladder incontinence, urgency, wears pads since 2014,  She has occasional bowel accident.  She denies significant neck pain.   We have reviewed MRI films in May 15 2015: MRI brain: Acute-subacute ischemic infarction in the left internal capsule (posterior limb) and basal ganglia. It measures 72mm in diameter. moderate chronic small vessel disease. MRI cervical: Multilevel degenerative disc disease, mild  to moderate canal stenosis multilevel foraminal stenosis at different levels. There was mild edema in the intraspinal ligament of C5-6 level, no spinal cord signal changes,   UPDATE Nov 19 2015: She continued to complains of difficulty walking, notice bilateral feet swelling, has finished physical therapy which has been helpful We have again reviewed MRI of the brain in October 2016, evidence of acute infarction in the left posterior limb of internal capsule and basal ganglion, moderate supratentorial and small vessel disease. She is now taking aspirin 81 mg daily with history of coronary artery disease and also coumadin for stroke paroxysmal atrial fibrillation UPDATE November 2 2017CM Ms. [name], 65 year old female returns for follow-up. She has had several recent falls and she feels her knee gives out on the left side. She has just begun in health physical therapy and occupational therapy approximately 2 weeks ago. She has a history of left posterior limb internal capsule and basal ganglion infarct with small vessel disease. In addition she has a history of paroxysmal atrial fibrillation. She is currently on Coumadin and 0.81 aspirin daily  and she is currently getting her blood checked every 2 weeks. She is ambulating with single-point cane. MRI of cervical spine in the past that showed multilevel degenerative disc disease. She returns for reevaluation  REVIEW OF SYSTEMS: Full 14 system review of systems performed and notable only for those listed, all others are neg:  Constitutional: Fatigue  Cardiovascular: neg Ear/Nose/Throat: neg  Skin: neg Eyes: neg Respiratory: Shortness of breath with exertion, COPD Gastroitestinal: neg  Genitourinary urinary incontinence Hematology/Lymphatic: Easy bruising  Endocrine: neg Musculoskeletal:neg Allergy/Immunology: Environmental allergies Neurological: Headache Psychiatric: Depression  Sleep : neg   ALLERGIES: Allergies  Allergen  Reactions  .  Meprobamate Nausea And Vomiting    HOME MEDICATIONS:  Current Outpatient Prescriptions:  .  ADVAIR DISKUS 250-50 MCG/DOSE AEPB, INHALE ONE PUFF TWICE DAILY, Disp: 60 each, Rfl: 5 .  albuterol (PROVENTIL HFA;VENTOLIN HFA) 108 (90 Base) MCG/ACT inhaler, Inhale into the lungs every 6 (six) hours as needed for wheezing or shortness of breath. INHALE 2 (TWO) PUFFS INTO THE LUNGS EVERY 6 (SIX) HOURS AS NEEDED FORWHEEZING OR SHORTNESS OF BREATH, Disp: , Rfl:  .  albuterol (PROVENTIL) (2.5 MG/3ML) 0.083% nebulizer solution, Take 3 mLs (2.5 mg total) by nebulization every 6 (six) hours as needed., Disp: 360 mL, Rfl: 2 .  ARIPiprazole (ABILIFY) 5 MG tablet, TAKE ONE (1) TABLET BY MOUTH EVERY DAY, Disp: 90 tablet, Rfl: 0 .  aspirin 81 MG tablet, Take 81 mg by mouth daily.  , Disp: , Rfl:  .  benztropine (COGENTIN) 0.5 MG tablet, Take 1 tablet (0.5 mg total) by mouth at bedtime., Disp: 90 tablet, Rfl: 0 .  diltiazem (CARDIZEM CD) 180 MG 24 hr capsule, Take 1 capsule (180 mg total) by mouth daily., Disp: 90 capsule, Rfl: 3 .  fexofenadine (ALLEGRA) 60 MG tablet, Take 1 tablet (60 mg total) by mouth daily. Take 60 mg by mouth daily., Disp: 30 tablet, Rfl: 11 .  furosemide (LASIX) 40 MG tablet, TAKE ONE (1) TABLET BY MOUTH EVERY DAY, Disp: 30 tablet, Rfl: 9 .  gabapentin (NEURONTIN) 300 MG capsule, Take 300 mg by mouth at bedtime., Disp: , Rfl: 0 .  HYDROcodone-acetaminophen (NORCO) 10-325 MG per tablet, Take 1 tablet by mouth every 6 (six) hours as needed., Disp: , Rfl: 0 .  irbesartan (AVAPRO) 150 MG tablet, Take 1 tablet (150 mg total) by mouth daily., Disp: 90 tablet, Rfl: 3 .  isosorbide mononitrate (IMDUR) 30 MG 24 hr tablet, TAKE ONE (1) TABLET BY MOUTH EVERY DAY, Disp: 90 tablet, Rfl: 3 .  metFORMIN (GLUCOPHAGE-XR) 500 MG 24 hr tablet, TAKE ONE (1) TABLET BY MOUTH EVERY DAY WITH BREAKFAST, Disp: 90 tablet, Rfl: 3 .  metoprolol (LOPRESSOR) 100 MG tablet, TAKE 1 TABLET BY MOUTH 2 TIMES DAILY, Disp: 180  tablet, Rfl: 3 .  mometasone (NASONEX) 50 MCG/ACT nasal spray, Place 2 sprays into the nose daily., Disp: 17 g, Rfl: 2 .  nitroGLYCERIN (NITROSTAT) 0.4 MG SL tablet, Place 1 tablet (0.4 mg total) under the tongue every 5 (five) minutes as needed., Disp: 25 tablet, Rfl: 3 .  pantoprazole (PROTONIX) 20 MG tablet, Take 20 mg by mouth daily., Disp: , Rfl:  .  rosuvastatin (CRESTOR) 10 MG tablet, TAKE ONE (1) TABLET BY MOUTH EVERY DAY, Disp: 90 tablet, Rfl: 3 .  sertraline (ZOLOFT) 100 MG tablet, Take 2 by mouth once daily, Disp: 180 tablet, Rfl: 0 .  Trospium Chloride 60 MG CP24, TAKE ONE CAPSULE BY MOUTH DAILY, Disp: 90 each, Rfl: 1 .  warfarin (COUMADIN) 7.5 MG tablet, TAKE ONE AND ONE-HALF (1 & 1/2) TO TWO (2) TABLETS BY MOUTH DAILY AS DIRECTED, Disp: 165 tablet, Rfl: 1     PAST MEDICAL HISTORY: Past Medical History:  Diagnosis Date  . Allergic rhinitis   . 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 Asthma   . CAD (coronary artery disease)   . CAD (coronary artery disease) 01/30/2007   stents trivial coronary artery disease diffusely, with a recent deployment od a intracoronary artery stent, 3.5 x 12 mm driver stent with no more than 20-30% in- stents restenosis.done by  Dr [name] with re-look on novenber 10 2008 revealing a widely patent stent with otherwise trival CAD and normal LV function  . Carpal tunnel syndrome, right   . Chest pain 07/17/12009   2 D Echo EF >55%  . Depression   . Diabetes mellitus type II   . Gait instability   . Hyperlipidemia   . Hypertension   . Obesity   . Paroxysmal a-fib ([place])   . PTSD (post-traumatic stress disorder)   . Sleep apnea     PAST SURGICAL HISTORY: Past Surgical History:  Procedure Laterality Date  . CHOLECYSTECTOMY    . GALLBLADDER SURGERY    . HEMORRHOID SURGERY    . Kidney stones    . Left groin cyst    . multilevel lumbar degenerative disc disease    . stress myocardial perfusion study  09/08/2010   normal; EF 75%     FAMILY HISTORY: Family History  Problem Relation Age of Onset  . Coronary artery disease Mother   . Coronary artery disease Father   . Depression Father     SOCIAL HISTORY: Social History   Social History  . Marital status: Divorced    Spouse name: N/A  . Number of children: 2  . Years of education: 14   Occupational History  . 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 Smoking status: Never Smoker  . Smokeless tobacco: Never Used  . Alcohol use No  . Drug use: No  . Sexual activity: Not on file   Other Topics Concern  . Not on file   Social History Narrative   Lives at home alone.   Right-handed.   Occasional caffeine use.     PHYSICAL EXAM  Vitals:   05/20/16 1237  BP: 108/63  Pulse: 72  Weight: 299 lb 12.8 oz (136 kg)  Height: 5\' 2"  (1.575 m)   Body mass index is 54.83 kg/m.  Generalized: Well developed, Morbidly obese female in no acute distress , well-groomed Head: normocephalic and atraumatic,. Oropharynx benign  Neck: Supple, no carotid bruits  Cardiac: Regular rate rhythm, no murmur  Musculoskeletal: No deformity   Neurological examination   Mentation: Alert oriented to time, place, history taking. Attention span and concentration appropriate. Recent and remote memory intact.  Follows all commands speech and language fluent.   Cranial nerve II-XII: Pupils were equal round reactive to light extraocular movements were full, visual field were full on confrontational test. Facial sensation and strength were normal. hearing was intact to finger rubbing bilaterally. Uvula tongue midline. head turning and shoulder shrug were normal and symmetric.Tongue protrusion into cheek strength was normal. Motor: Moderate right and left hip flexion weakness ankle dorsiflexion weakness Sensory: normal and symmetric to light touch, pinprick, and  Vibration, in the upper and lower extremities Coordination: finger-nose-finger,  heel-to-shin bilaterally, no dysmetria Reflexes: Brachioradialis 2/2, biceps 2/2, triceps 2/2, patellar 2/2, Achilles 2/2, plantar responses were flexor bilaterally. Gait and Station: Rising up from seated position without assistance, wide based stance,  cautious unsteady gait dragging the left leg ambulates with single-point cane   DIAGNOSTIC DATA (LABS, IMAGING, TESTING) - I reviewed patient records, labs, notes, testing and imaging myself where available.      Component Value Date/Time   NA 139 07/31/2015 1305   NA 144 10/02/2012 0905   K 3.9 07/31/2015 1305   K 3.4 (L) 10/02/2012 0905   CL 97 07/31/2015 1305   CL 101  10/02/2012 0905   CO2 32 07/31/2015 1305   CO2 31 (H) 10/02/2012 0905   GLUCOSE 84 07/31/2015 1305   GLUCOSE 107 (H) 10/02/2012 0905   BUN 21 07/31/2015 1305   BUN 13.9 10/02/2012 0905   CREATININE 0.83 07/31/2015 1305   CREATININE 0.77 12/08/2014 1153   CREATININE 0.8 10/02/2012 0905   CALCIUM 10.2 07/31/2015 1305   CALCIUM 9.9 10/02/2012 0905   PROT 7.6 11/25/2015 0841   ALBUMIN 4.3 11/25/2015 0841   AST 16 11/25/2015 0841   ALT 19 11/25/2015 0841   ALKPHOS 93 11/25/2015 0841   BILITOT 0.5 11/25/2015 0841   GFRNONAA >89 01/08/2014 1204   GFRAA >89 01/08/2014 1204   Lab Results  Component Value Date   CHOL 150 11/25/2015   HDL 55 11/25/2015   LDLCALC 69 11/25/2015   TRIG 130 11/25/2015   CHOLHDL 2.7 11/25/2015   Lab Results  Component Value Date   HGBA1C 6.2 11/06/2015     ASSESSMENT AND PLAN  65 y.o. year old female  has a past medical history of  CAD (coronary artery disease);  Depression; Diabetes mellitus type II; Gait instability; Hyperlipidemia; Hypertension; Obesity; Paroxysmal a-fib ([place]);  and Sleep apnea, small vessel  stroke in gait after modality which is multifactorial due to her obesity bilateral knee pain hip flexion weakness and previous stroke.   PLAN: Continue Coumadin for secondary stroke prevention and history of paroxysmal  atrial fibrillation Continue physical therapy and occupational therapy for strengthening exercises and home exercise program Blood pressure in the office today 108/63 make sure you stay well hydrated Risk for falls due to your gait abnormality use cane at all times Follow-up in 6 months Dennie Bible, [facility], [facility], [facility] [address] [place], [place] [address] [phone]

## 2016-05-20 NOTE — Telephone Encounter (Signed)
Michele [name] is coming to office currently to discuss this.  Will hold until [place] returns to office.

## 2016-05-20 NOTE — Telephone Encounter (Signed)
[id number] x1 

## 2016-05-20 NOTE — Telephone Encounter (Signed)
Spoke with Melissa in office, states that patient's machine is not compatible with an autotitrating pressure, and because she has documented noncompliance we must "start over" with cpap therapy- starting with a sleep study.    CY please advise.  Thanks!

## 2016-05-20 NOTE — Progress Notes (Signed)
I have reviewed and agreed above plan. 

## 2016-05-24 ENCOUNTER — Ambulatory Visit: Payer: Medicare Other | Admitting: Nurse Practitioner

## 2016-05-25 ENCOUNTER — Ambulatory Visit (INDEPENDENT_AMBULATORY_CARE_PROVIDER_SITE_OTHER): Payer: Medicare Other | Admitting: Pharmacist

## 2016-05-25 DIAGNOSIS — I480 Paroxysmal atrial fibrillation: Secondary | ICD-10-CM | POA: Diagnosis not present

## 2016-05-25 DIAGNOSIS — Z7901 Long term (current) use of anticoagulants: Secondary | ICD-10-CM | POA: Diagnosis not present

## 2016-05-25 LAB — POCT INR: INR: 3.5

## 2016-05-25 NOTE — Telephone Encounter (Signed)
Spoke with pt, aware that we must order sleep study. Order placed.   Nothing further needed at this time.

## 2016-05-27 ENCOUNTER — Ambulatory Visit (INDEPENDENT_AMBULATORY_CARE_PROVIDER_SITE_OTHER): Payer: Medicare Other | Admitting: *Deleted

## 2016-05-27 DIAGNOSIS — J309 Allergic rhinitis, unspecified: Secondary | ICD-10-CM

## 2016-05-31 DIAGNOSIS — R296 Repeated falls: Secondary | ICD-10-CM | POA: Diagnosis not present

## 2016-06-02 ENCOUNTER — Other Ambulatory Visit: Payer: Self-pay | Admitting: Physician Assistant

## 2016-06-02 ENCOUNTER — Other Ambulatory Visit: Payer: Self-pay | Admitting: Internal Medicine

## 2016-06-02 ENCOUNTER — Ambulatory Visit (INDEPENDENT_AMBULATORY_CARE_PROVIDER_SITE_OTHER): Payer: No Typology Code available for payment source | Admitting: Psychology

## 2016-06-02 ENCOUNTER — Other Ambulatory Visit (HOSPITAL_COMMUNITY): Payer: Self-pay | Admitting: Psychiatry

## 2016-06-02 DIAGNOSIS — F331 Major depressive disorder, recurrent, moderate: Secondary | ICD-10-CM

## 2016-06-02 DIAGNOSIS — F330 Major depressive disorder, recurrent, mild: Secondary | ICD-10-CM

## 2016-06-02 NOTE — Progress Notes (Signed)
   THERAPIST PROGRESS NOTE  Session Time: 12.40pm-1.26pm  Participation Level: Active  Behavioral Response: Well GroomedAlertDepressed  Type of Therapy: Individual Therapy  Treatment Goals addressed: Diagnosis: MDD and goal 1.  Interventions: CBT and Supportive  Summary: Michele [name] is a 65 y.o. female who presents with slightly depressed mood and affect.  Pt reports that physical therapy is going well.  Pt reports that she does have home health aid come 2x a week but primarily just assists bathing.  Pt reports that she feels lonely despite the company and closeness of her sisters.  Pt identified ways of keeping connected and engaging w/ holidays upcoming to avoid further depressive episodes.     Suicidal/Homicidal: Nowithout intent/plan  Therapist Response: Assessed pt current functioning per pt report. Processed w/pt loneliness and self care and connecting to assist w/ decreasing feelings of loneliness.  Explored w/pt upcoming holidays.   Plan: Return again in 2 weeks.  Diagnosis: MDD    [name], [facility] 06/02/2016

## 2016-06-03 ENCOUNTER — Ambulatory Visit (INDEPENDENT_AMBULATORY_CARE_PROVIDER_SITE_OTHER): Payer: Medicare Other | Admitting: *Deleted

## 2016-06-03 DIAGNOSIS — J309 Allergic rhinitis, unspecified: Secondary | ICD-10-CM | POA: Diagnosis not present

## 2016-06-08 ENCOUNTER — Ambulatory Visit (INDEPENDENT_AMBULATORY_CARE_PROVIDER_SITE_OTHER): Payer: Medicare Other

## 2016-06-08 ENCOUNTER — Ambulatory Visit (INDEPENDENT_AMBULATORY_CARE_PROVIDER_SITE_OTHER): Payer: Medicare Other | Admitting: Pharmacist

## 2016-06-08 DIAGNOSIS — I480 Paroxysmal atrial fibrillation: Secondary | ICD-10-CM

## 2016-06-08 DIAGNOSIS — Z7901 Long term (current) use of anticoagulants: Secondary | ICD-10-CM

## 2016-06-08 DIAGNOSIS — J309 Allergic rhinitis, unspecified: Secondary | ICD-10-CM | POA: Diagnosis not present

## 2016-06-08 LAB — POCT INR: INR: 2.8

## 2016-06-17 ENCOUNTER — Ambulatory Visit: Payer: Medicare Other

## 2016-06-17 ENCOUNTER — Ambulatory Visit (HOSPITAL_COMMUNITY): Payer: Self-pay | Admitting: Psychology

## 2016-06-17 ENCOUNTER — Ambulatory Visit (INDEPENDENT_AMBULATORY_CARE_PROVIDER_SITE_OTHER): Payer: Medicare Other | Admitting: *Deleted

## 2016-06-17 DIAGNOSIS — J309 Allergic rhinitis, unspecified: Secondary | ICD-10-CM

## 2016-06-24 ENCOUNTER — Ambulatory Visit (INDEPENDENT_AMBULATORY_CARE_PROVIDER_SITE_OTHER): Payer: No Typology Code available for payment source | Admitting: Psychology

## 2016-06-24 ENCOUNTER — Ambulatory Visit (INDEPENDENT_AMBULATORY_CARE_PROVIDER_SITE_OTHER): Payer: Medicare Other | Admitting: *Deleted

## 2016-06-24 DIAGNOSIS — J309 Allergic rhinitis, unspecified: Secondary | ICD-10-CM

## 2016-06-24 DIAGNOSIS — F330 Major depressive disorder, recurrent, mild: Secondary | ICD-10-CM | POA: Diagnosis not present

## 2016-06-24 NOTE — Progress Notes (Signed)
   THERAPIST PROGRESS NOTE  Session Time: 11.55am-12.48am  Participation Level: Active  Behavioral Response: Well GroomedAlertDepressed  Type of Therapy: Individual Therapy  Treatment Goals addressed: Diagnosis: MDD and goal 1.  Interventions: CBT  Summary: Michele [name] is a 65 y.o. female who presents with affect blunted.  Pt reported that she feels some down days when feeling lonely and missing others.  Pt reported that she does have some worry about future not being able ot be independent. Pt reports that she has been able to enjoy interactions w/ sisters and stay connected w/ other family but does missed deceased love ones at holidays.  Pt reported that struggling more w/ memory feels.  Pt reports her PT is going well and working towards building her stamina to do things for self around the house.  .   Suicidal/Homicidal: Nowithout intent/plan  Therapist Response: Assessed pt current functioning per pt report.  Discussed w/ pt her sadness and grief at the holidays.  Assisted pt in recognizing how to remember deceased loved ones and be present to connections in the present. validated and normalized worries about living arrangements and wants to remain independent.   Plan: Return again in 2 weeks.  Diagnosis: MDD   [name], [facility] 06/24/2016

## 2016-06-29 ENCOUNTER — Ambulatory Visit (INDEPENDENT_AMBULATORY_CARE_PROVIDER_SITE_OTHER): Payer: Medicare Other | Admitting: Pharmacist Clinician (PhC)/ Clinical Pharmacy Specialist

## 2016-06-29 DIAGNOSIS — Z7901 Long term (current) use of anticoagulants: Secondary | ICD-10-CM

## 2016-06-29 DIAGNOSIS — I480 Paroxysmal atrial fibrillation: Secondary | ICD-10-CM | POA: Diagnosis not present

## 2016-06-29 LAB — POCT INR: INR: 5.6

## 2016-07-01 ENCOUNTER — Ambulatory Visit (INDEPENDENT_AMBULATORY_CARE_PROVIDER_SITE_OTHER): Payer: Medicare Other | Admitting: Psychiatry

## 2016-07-01 ENCOUNTER — Ambulatory Visit (INDEPENDENT_AMBULATORY_CARE_PROVIDER_SITE_OTHER): Payer: Medicare Other | Admitting: *Deleted

## 2016-07-01 ENCOUNTER — Encounter (HOSPITAL_COMMUNITY): Payer: Self-pay | Admitting: Psychiatry

## 2016-07-01 DIAGNOSIS — J309 Allergic rhinitis, unspecified: Secondary | ICD-10-CM | POA: Diagnosis not present

## 2016-07-01 DIAGNOSIS — Z9889 Other specified postprocedural states: Secondary | ICD-10-CM

## 2016-07-01 DIAGNOSIS — Z888 Allergy status to other drugs, medicaments and biological substances status: Secondary | ICD-10-CM

## 2016-07-01 DIAGNOSIS — F331 Major depressive disorder, recurrent, moderate: Secondary | ICD-10-CM | POA: Diagnosis not present

## 2016-07-01 DIAGNOSIS — Z79899 Other long term (current) drug therapy: Secondary | ICD-10-CM

## 2016-07-01 DIAGNOSIS — Z818 Family history of other mental and behavioral disorders: Secondary | ICD-10-CM

## 2016-07-01 DIAGNOSIS — Z8489 Family history of other specified conditions: Secondary | ICD-10-CM | POA: Diagnosis not present

## 2016-07-01 MED ORDER — SERTRALINE HCL 100 MG PO TABS: ORAL_TABLET | ORAL | 0 refills | Status: DC

## 2016-07-01 MED ORDER — ARIPIPRAZOLE 5 MG PO TABS: ORAL_TABLET | ORAL | 0 refills | Status: DC

## 2016-07-01 MED ORDER — BENZTROPINE MESYLATE 0.5 MG PO TABS: 0.5000 mg | ORAL_TABLET | Freq: Every day | ORAL | 0 refills | Status: DC
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
## 2016-07-05 ENCOUNTER — Other Ambulatory Visit (HOSPITAL_BASED_OUTPATIENT_CLINIC_OR_DEPARTMENT_OTHER): Payer: Medicare Other

## 2016-07-05 DIAGNOSIS — D509 Iron deficiency anemia, unspecified: Secondary | ICD-10-CM

## 2016-07-05 LAB — CBC WITH DIFFERENTIAL/PLATELET
BASO%: 0.4 % (ref 0.0–2.0)
Basophils Absolute: 0 10*3/uL (ref 0.0–0.1)
EOS%: 3.3 % (ref 0.0–7.0)
Eosinophils Absolute: 0.2 10*3/uL (ref 0.0–0.5)
HEMATOCRIT: 31.7 % — AB (ref 34.8–46.6)
HEMOGLOBIN: 9.8 g/dL — AB (ref 11.6–15.9)
LYMPH#: 1.8 10*3/uL (ref 0.9–3.3)
LYMPH%: 26.3 % (ref 14.0–49.7)
MCH: 20.5 pg — ABNORMAL LOW (ref 25.1–34.0)
MCHC: 30.9 g/dL — AB (ref 31.5–36.0)
MCV: 66.5 fL — ABNORMAL LOW (ref 79.5–101.0)
MONO#: 0.6 10*3/uL (ref 0.1–0.9)
MONO%: 9 % (ref 0.0–14.0)
NEUT%: 61 % (ref 38.4–76.8)
NEUTROS ABS: 4.2 10*3/uL (ref 1.5–6.5)
Platelets: 304 10*3/uL (ref 145–400)
RBC: 4.76 10*6/uL (ref 3.70–5.45)
RDW: 18.3 % — AB (ref 11.2–14.5)
WBC: 6.9 10*3/uL (ref 3.9–10.3)

## 2016-07-05 LAB — IRON AND TIBC
%SAT: 8 % — ABNORMAL LOW (ref 21–57)
Iron: 24 ug/dL — ABNORMAL LOW (ref 41–142)
TIBC: 305 ug/dL (ref 236–444)
UIBC: 281 ug/dL (ref 120–384)

## 2016-07-05 LAB — FERRITIN: Ferritin: 278 ng/ml — ABNORMAL HIGH (ref 9–269)

## 2016-07-08 ENCOUNTER — Ambulatory Visit (INDEPENDENT_AMBULATORY_CARE_PROVIDER_SITE_OTHER): Payer: Medicare Other | Admitting: *Deleted

## 2016-07-08 ENCOUNTER — Other Ambulatory Visit: Payer: Self-pay | Admitting: Emergency Medicine

## 2016-07-08 ENCOUNTER — Ambulatory Visit (INDEPENDENT_AMBULATORY_CARE_PROVIDER_SITE_OTHER): Payer: No Typology Code available for payment source | Admitting: Psychology

## 2016-07-08 DIAGNOSIS — J309 Allergic rhinitis, unspecified: Secondary | ICD-10-CM | POA: Diagnosis not present

## 2016-07-08 DIAGNOSIS — F330 Major depressive disorder, recurrent, mild: Secondary | ICD-10-CM

## 2016-07-08 NOTE — Progress Notes (Signed)
   THERAPIST PROGRESS NOTE  Session Time: 12.34pm-1.32pm  Participation Level: Active  Behavioral Response: Well GroomedAlertDepressed  Type of Therapy: Family Therapy  Treatment Goals addressed: Diagnosis: MDD and goal 2.  Interventions: Supportive  Summary: Michele [name] is a 65 y.o. female who presents with affect congruent w/ depressed mood.  Pt's daughter accompanied pt to tx today and pt gave permission for daughter to join session.  Daughter expressed concern about benefit of counseling and wanted to have counselor share about assessment, goals and progress.  Pt gave permission for sharing.  Her daughter isn't sure that w/ her limitations congitively that she will benefit from counseling.  She reports that she has been dx w/ dementia.  Daughter shared about services receiving in the home- OT, PT that pt has already informed of previously and that she is looking into home health aid.  Daughter is concerned that pt doesn't do things for self that could around the house and that ultimately she wants her mother to make improvements for weight loss to benefit her health.  Pt was able to identify and share for daughter how she feels supported in counseling and gives her a place to talk about her concerns and have guidance.  Pt was able to share w/ daughter that she is aware of her weight is issue and that some fear in losing the weight and fears w/ exercising.  Daughter increased awareness that pt counseling is based on pt goals for tx and that intention for counseling is not social or for pt to be depended on counseling.    Suicidal/Homicidal: Nowithout intent/plan  Therapist Response: Assessed pt current functioning per pt and daughter report.  Listened to concerns of daughter- sought permission from pt to share what pt and counselor are working towards for pt .  Provided psychoeducation re: counseling process- awareness of pt decline cognitively but no indications that counseling is  contraindicated.  Encouraged to talk w/ doctors and  neurologist re: recommendations.  Encouraged pt to express what she is wanting and getting from counseling to daughter. Reiterated w/ daughter tx plan developed per pt goals. Met w/ pt last 10 minutes and discussed her feelings w/ session, her wants for counseling and considering further for tx plan review next session.   Plan: Return again in 2 weeks.  Diagnosis: MDD, recurrent, mild    Michele [name],Michele [name], LPC 07/08/2016

## 2016-07-13 ENCOUNTER — Ambulatory Visit (HOSPITAL_BASED_OUTPATIENT_CLINIC_OR_DEPARTMENT_OTHER): Payer: Medicare Other | Admitting: Internal Medicine

## 2016-07-13 ENCOUNTER — Encounter: Payer: Self-pay | Admitting: Internal Medicine

## 2016-07-13 DIAGNOSIS — D509 Iron deficiency anemia, unspecified: Secondary | ICD-10-CM | POA: Diagnosis not present

## 2016-07-13 DIAGNOSIS — D508 Other iron deficiency anemias: Secondary | ICD-10-CM

## 2016-07-13 HISTORY — DX: Iron deficiency anemia, unspecified: D50.9

## 2016-07-13 NOTE — Progress Notes (Signed)
[facility] [phone]   [phone]  OFFICE PROGRESS NOTE  Michele Greenland, MD [address] [place] [address]  DIAGNOSIS: Unspecified anemia questionable for anemia of chronic disease/iron deficiency.   PRIOR THERAPY: None   CURRENT THERAPY: Over-the-counter oral iron tablets. 1 tablets daily 2-3 times a week.  INTERVAL HISTORY: Michele [name] 65 y.o. female came to the clinic today for evaluation. The patient continues to complain of fatigue and weakness. She also has pain in her left knee. She takes over-the-counter iron tablets but only once a week. She denied having any bleeding issues. She has no chest pain but has shortness of breath with exertion with no cough or hemoptysis. The patient denied having any weight loss or night sweats. She has no nausea or vomiting. She had repeat CBC, iron study and ferritin performed recently and she is here for evaluation and discussion of her lab results.  MEDICAL HISTORY: Past Medical History:  Diagnosis Date  . Allergic rhinitis   . 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 Asthma   . CAD (coronary artery disease)   . CAD (coronary artery disease) 01/30/2007   stents trivial coronary artery disease diffusely, with a recent deployment od a intracoronary artery stent, 3.5 x 12 mm driver stent with no more than 20-30% in- stents restenosis.done by Dr [name] with re-look on novenber 10 2008 revealing a widely patent stent with otherwise trival CAD and normal LV function  . Carpal tunnel syndrome, right   . Chest pain 07/17/12009   2 D Echo EF >55%  . Depression   . Diabetes mellitus type II   . Gait instability   . Hyperlipidemia   . Hypertension   . Obesity   . Paroxysmal a-fib ([place])   . PTSD (post-traumatic stress disorder)   . Sleep apnea     ALLERGIES:  is allergic to meprobamate.  MEDICATIONS:  Current Outpatient Prescriptions  Medication Sig Dispense Refill   . ADVAIR DISKUS 250-50 MCG/DOSE AEPB INHALE ONE PUFF TWICE DAILY 60 each 5  . albuterol (PROVENTIL HFA;VENTOLIN HFA) 108 (90 Base) MCG/ACT inhaler Inhale into the lungs every 6 (six) hours as needed for wheezing or shortness of breath. INHALE 2 (TWO) PUFFS INTO THE LUNGS EVERY 6 (SIX) HOURS AS NEEDED FORWHEEZING OR SHORTNESS OF BREATH    . albuterol (PROVENTIL) (2.5 MG/3ML) 0.083% nebulizer solution Take 3 mLs (2.5 mg total) by nebulization every 6 (six) hours as needed. 360 mL 2  . ARIPiprazole (ABILIFY) 5 MG tablet TAKE ONE (1) TABLET BY MOUTH EVERY DAY 90 tablet 0  . aspirin 81 MG tablet Take 81 mg by mouth daily.      . benztropine (COGENTIN) 0.5 MG tablet Take 1 tablet (0.5 mg total) by mouth at bedtime. 90 tablet 0  . diltiazem (CARDIZEM CD) 180 MG 24 hr capsule Take 1 capsule (180 mg total) by mouth daily. 90 capsule 3  . fexofenadine (ALLEGRA) 60 MG tablet Take 1 tablet (60 mg total) by mouth daily. Take 60 mg by mouth daily. 30 tablet 11  . furosemide (LASIX) 40 MG tablet TAKE ONE (1) TABLET BY MOUTH EVERY DAY 30 tablet 9  . gabapentin (NEURONTIN) 300 MG capsule Take 300 mg by mouth at bedtime.  0  . HYDROcodone-acetaminophen (NORCO) 10-325 MG per tablet Take 1 tablet by mouth every 6 (six) hours as needed.  0  . irbesartan (AVAPRO) 150 MG tablet Take 1  tablet (150 mg total) by mouth daily. 90 tablet 3  . isosorbide mononitrate (IMDUR) 30 MG 24 hr tablet TAKE ONE (1) TABLET BY MOUTH EVERY DAY 90 tablet 3  . metFORMIN (GLUCOPHAGE-XR) 500 MG 24 hr tablet TAKE ONE (1) TABLET BY MOUTH EVERY DAY WITH BREAKFAST 90 tablet 3  . metoprolol (LOPRESSOR) 100 MG tablet TAKE 1 TABLET BY MOUTH 2 TIMES DAILY 180 tablet 3  . mometasone (NASONEX) 50 MCG/ACT nasal spray Place 2 sprays into the nose daily. 17 g 2  . nitroGLYCERIN (NITROSTAT) 0.4 MG SL tablet Place 1 tablet (0.4 mg total) under the tongue every 5 (five) minutes as needed. 25 tablet 3  . pantoprazole (PROTONIX) 20 MG tablet Take 20 mg by mouth  daily.    . rosuvastatin (CRESTOR) 10 MG tablet TAKE ONE (1) TABLET BY MOUTH EVERY DAY 90 tablet 3  . sertraline (ZOLOFT) 100 MG tablet TAKE TWO (2) TABLETS BY MOUTH DAILY 180 tablet 0  . Trospium Chloride 60 MG CP24 TAKE ONE CAPSULE BY MOUTH DAILY 90 each 1  . warfarin (COUMADIN) 7.5 MG tablet TAKE ONE AND ONE-HALF (1 & 1/2) TO TWO (2) TABLETS BY MOUTH DAILY AS DIRECTED 165 tablet 1   No current facility-administered medications for this visit.     SURGICAL HISTORY:  Past Surgical History:  Procedure Laterality Date  . CHOLECYSTECTOMY    . GALLBLADDER SURGERY    . HEMORRHOID SURGERY    . Kidney stones    . Left groin cyst    . multilevel lumbar degenerative disc disease    . stress myocardial perfusion study  09/08/2010   normal; EF 75%    REVIEW OF SYSTEMS:  A comprehensive review of systems was negative except for: Constitutional: positive for fatigue Musculoskeletal: positive for arthralgias   PHYSICAL EXAMINATION: General appearance: alert, cooperative, fatigued and no distress Head: Normocephalic, without obvious abnormality, atraumatic Neck: no adenopathy, no JVD, supple, symmetrical, trachea midline and thyroid not enlarged, symmetric, no tenderness/mass/nodules Lymph nodes: Cervical, supraclavicular, and axillary nodes normal. Resp: clear to auscultation bilaterally Back: symmetric, no curvature. ROM normal. No CVA tenderness. Cardio: regular rate and rhythm, S1, S2 normal, no murmur, click, rub or gallop GI: soft, non-tender; bowel sounds normal; no masses,  no organomegaly Extremities: extremities normal, atraumatic, no cyanosis or edema  ECOG PERFORMANCE STATUS: 1 - Symptomatic but completely ambulatory  Blood pressure (!) 109/56, pulse 83, temperature 98.4 F (36.9 C), temperature source Oral, resp. rate 16, height 5\' 2"  (1.575 m), weight 295 lb 6.4 oz (134 kg), SpO2 96 %.  LABORATORY DATA: Lab Results  Component Value Date   WBC 6.9 07/05/2016   HGB 9.8 (L)  07/05/2016   HCT 31.7 (L) 07/05/2016   MCV 66.5 (L) 07/05/2016   PLT 304 07/05/2016      Chemistry      Component Value Date/Time   NA 139 07/31/2015 1305   NA 144 10/02/2012 0905   K 3.9 07/31/2015 1305   K 3.4 (L) 10/02/2012 0905   CL 97 07/31/2015 1305   CL 101 10/02/2012 0905   CO2 32 07/31/2015 1305   CO2 31 (H) 10/02/2012 0905   BUN 21 07/31/2015 1305   BUN 13.9 10/02/2012 0905   CREATININE 0.83 07/31/2015 1305   CREATININE 0.77 12/08/2014 1153   CREATININE 0.8 10/02/2012 0905      Component Value Date/Time   CALCIUM 10.2 07/31/2015 1305   CALCIUM 9.9 10/02/2012 0905   ALKPHOS 93 11/25/2015 0841   AST  16 11/25/2015 0841   ALT 19 11/25/2015 0841   BILITOT 0.5 11/25/2015 0841     Iron study: Serum iron 24, total iron binding capacity 305, iron saturation 8%. Serum ferritin 278.  RADIOGRAPHIC STUDIES: No results found.  ASSESSMENT AND PLAN: This is a very pleasant 65 years old African-American female with severe iron deficiency anemia plus/minus anemia of chronic disease.  She is currently on oral iron tablets but usually takes 1 tablet every week. Her hemoglobin and hematocrit as well as the serum iron are low today I discussed the lab result with the patient today. I gave her the option of continuing with oral iron tablets but on daily basis versus consideration of Feraheme infusion followed by maintenance oral iron tablets. The patient would like to proceed with the Feraheme infusion. I will arrange this to be performed later this week and next week. She would come back for follow-up visit in 3 months for reevaluation with repeat iron study and ferritin. She was advised to call immediately if she has any concerning symptoms in the interval. The patient voices understanding of current disease status and treatment options and is in agreement with the current care plan.  All questions were answered. The patient knows to call the clinic with any problems, questions or  concerns. We can certainly see the patient much sooner if necessary.  Disclaimer: This note was dictated with voice recognition software. Similar sounding words can inadvertently be transcribed and may not be corrected upon review.

## 2016-07-14 ENCOUNTER — Telehealth: Payer: Self-pay | Admitting: *Deleted

## 2016-07-14 DIAGNOSIS — F419 Anxiety disorder, unspecified: Secondary | ICD-10-CM | POA: Diagnosis not present

## 2016-07-14 DIAGNOSIS — J449 Chronic obstructive pulmonary disease, unspecified: Secondary | ICD-10-CM | POA: Diagnosis not present

## 2016-07-14 DIAGNOSIS — Z7982 Long term (current) use of aspirin: Secondary | ICD-10-CM | POA: Diagnosis not present

## 2016-07-14 DIAGNOSIS — F329 Major depressive disorder, single episode, unspecified: Secondary | ICD-10-CM | POA: Diagnosis not present

## 2016-07-14 DIAGNOSIS — G8929 Other chronic pain: Secondary | ICD-10-CM | POA: Diagnosis not present

## 2016-07-14 DIAGNOSIS — M25562 Pain in left knee: Secondary | ICD-10-CM | POA: Diagnosis not present

## 2016-07-14 DIAGNOSIS — Z7901 Long term (current) use of anticoagulants: Secondary | ICD-10-CM | POA: Diagnosis not present

## 2016-07-14 DIAGNOSIS — Z7951 Long term (current) use of inhaled steroids: Secondary | ICD-10-CM | POA: Diagnosis not present

## 2016-07-14 DIAGNOSIS — M545 Low back pain: Secondary | ICD-10-CM | POA: Diagnosis not present

## 2016-07-14 DIAGNOSIS — Z8673 Personal history of transient ischemic attack (TIA), and cerebral infarction without residual deficits: Secondary | ICD-10-CM | POA: Diagnosis not present

## 2016-07-14 NOTE — Telephone Encounter (Signed)
Per LOS I have scheduled appt and notified the scheduler 

## 2016-07-15 ENCOUNTER — Ambulatory Visit (INDEPENDENT_AMBULATORY_CARE_PROVIDER_SITE_OTHER): Payer: Medicare Other | Admitting: Pharmacist

## 2016-07-15 ENCOUNTER — Other Ambulatory Visit: Payer: Self-pay | Admitting: Cardiovascular Disease

## 2016-07-15 ENCOUNTER — Ambulatory Visit (INDEPENDENT_AMBULATORY_CARE_PROVIDER_SITE_OTHER): Payer: Medicare Other | Admitting: *Deleted

## 2016-07-15 DIAGNOSIS — Z7901 Long term (current) use of anticoagulants: Secondary | ICD-10-CM

## 2016-07-15 DIAGNOSIS — I480 Paroxysmal atrial fibrillation: Secondary | ICD-10-CM

## 2016-07-15 DIAGNOSIS — J309 Allergic rhinitis, unspecified: Secondary | ICD-10-CM | POA: Diagnosis not present

## 2016-07-15 LAB — POCT INR: INR: 2.7

## 2016-07-16 ENCOUNTER — Ambulatory Visit (HOSPITAL_BASED_OUTPATIENT_CLINIC_OR_DEPARTMENT_OTHER): Payer: Medicare Other

## 2016-07-16 ENCOUNTER — Ambulatory Visit (HOSPITAL_BASED_OUTPATIENT_CLINIC_OR_DEPARTMENT_OTHER): Payer: Medicare Other | Attending: Internal Medicine | Admitting: Internal Medicine

## 2016-07-16 VITALS — BP 125/70 | HR 75 | Temp 98.0°F | Resp 20

## 2016-07-16 VITALS — Ht 62.0 in | Wt 297.0 lb

## 2016-07-16 DIAGNOSIS — G4733 Obstructive sleep apnea (adult) (pediatric): Secondary | ICD-10-CM | POA: Diagnosis not present

## 2016-07-16 DIAGNOSIS — D509 Iron deficiency anemia, unspecified: Secondary | ICD-10-CM

## 2016-07-16 DIAGNOSIS — Z9989 Dependence on other enabling machines and devices: Secondary | ICD-10-CM

## 2016-07-16 DIAGNOSIS — D508 Other iron deficiency anemias: Secondary | ICD-10-CM

## 2016-07-16 MED ORDER — SODIUM CHLORIDE 0.9 % IV SOLN
510.0000 mg | Freq: Once | INTRAVENOUS | Status: AC
  Administered 2016-07-16: 510 mg via INTRAVENOUS
  Filled 2016-07-16: qty 17

## 2016-07-16 MED ORDER — SODIUM CHLORIDE 0.9 % IV SOLN
Freq: Once | INTRAVENOUS | Status: AC
  Administered 2016-07-16: 08:00:00 via INTRAVENOUS

## 2016-07-16 NOTE — Patient Instructions (Signed)
Ferumoxytol injection What is this medicine? FERUMOXYTOL is an iron complex. Iron is used to make healthy red blood cells, which carry oxygen and nutrients throughout the body. This medicine is used to treat iron deficiency anemia in people with chronic kidney disease. COMMON BRAND NAME(S): Feraheme What should I tell my health care provider before I take this medicine? They need to know if you have any of these conditions: -anemia not caused by low iron levels -high levels of iron in the blood -magnetic resonance imaging (MRI) test scheduled -an unusual or allergic reaction to iron, other medicines, foods, dyes, or preservatives -pregnant or trying to get pregnant -breast-feeding How should I use this medicine? This medicine is for injection into a vein. It is given by a health care professional in a hospital or clinic setting. Talk to your pediatrician regarding the use of this medicine in children. Special care may be needed. What if I miss a dose? It is important not to miss your dose. Call your doctor or health care professional if you are unable to keep an appointment. What may interact with this medicine? This medicine may interact with the following medications: -other iron products What should I watch for while using this medicine? Visit your doctor or healthcare professional regularly. Tell your doctor or healthcare professional if your symptoms do not start to get better or if they get worse. You may need blood work done while you are taking this medicine. You may need to follow a special diet. Talk to your doctor. Foods that contain iron include: whole grains/cereals, dried fruits, beans, or peas, leafy green vegetables, and organ meats (liver, kidney). What side effects may I notice from receiving this medicine? Side effects that you should report to your doctor or health care professional as soon as possible: -allergic reactions like skin rash, itching or hives, swelling of the  face, lips, or tongue -breathing problems -changes in blood pressure -feeling faint or lightheaded, falls -fever or chills -flushing, sweating, or hot feelings -swelling of the ankles or feet Side effects that usually do not require medical attention (report to your doctor or health care professional if they continue or are bothersome): -diarrhea -headache -nausea, vomiting -stomach pain Where should I keep my medicine? This drug is given in a hospital or clinic and will not be stored at home.  2017 Elsevier/Gold Standard (2015-08-07 12:41:49)  

## 2016-07-21 ENCOUNTER — Other Ambulatory Visit (HOSPITAL_BASED_OUTPATIENT_CLINIC_OR_DEPARTMENT_OTHER): Payer: Self-pay

## 2016-07-21 DIAGNOSIS — G4733 Obstructive sleep apnea (adult) (pediatric): Secondary | ICD-10-CM

## 2016-07-22 ENCOUNTER — Telehealth (HOSPITAL_COMMUNITY): Payer: Self-pay

## 2016-07-22 ENCOUNTER — Ambulatory Visit (INDEPENDENT_AMBULATORY_CARE_PROVIDER_SITE_OTHER): Payer: No Typology Code available for payment source | Admitting: Psychology

## 2016-07-22 DIAGNOSIS — R7309 Other abnormal glucose: Secondary | ICD-10-CM | POA: Diagnosis not present

## 2016-07-22 DIAGNOSIS — F330 Major depressive disorder, recurrent, mild: Secondary | ICD-10-CM

## 2016-07-22 DIAGNOSIS — R55 Syncope and collapse: Secondary | ICD-10-CM | POA: Diagnosis not present

## 2016-07-22 DIAGNOSIS — E785 Hyperlipidemia, unspecified: Secondary | ICD-10-CM | POA: Diagnosis not present

## 2016-07-22 DIAGNOSIS — I10 Essential (primary) hypertension: Secondary | ICD-10-CM | POA: Diagnosis not present

## 2016-07-22 NOTE — Telephone Encounter (Signed)
Medication management - Met with patient after she fell backwards against the wall checking in. Patient denied hitting her head or falling down and reported at times gets a little dizzy.  Patient's blood pressure 128/76 and pulse 73.  States a history of strokes but denies any weakness today.  Patient denies shortness of breath but O2 was 93%.  Patient wanted to attend therapy appointment today and then saw her after evaluation as she was going to PCP from this office.  Assisted patient down to her car as she reported feeling better and denied being dizzy at this time.  Patient stable upon leaving and reported going to her PCP from this office for further follow up.  Paper note written to her provider to follow up.
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
## 2016-07-23 ENCOUNTER — Ambulatory Visit (HOSPITAL_BASED_OUTPATIENT_CLINIC_OR_DEPARTMENT_OTHER): Payer: Medicare Other

## 2016-07-23 VITALS — BP 107/66 | HR 59 | Temp 96.9°F | Resp 18

## 2016-07-23 DIAGNOSIS — D508 Other iron deficiency anemias: Secondary | ICD-10-CM

## 2016-07-23 DIAGNOSIS — D509 Iron deficiency anemia, unspecified: Secondary | ICD-10-CM | POA: Diagnosis not present

## 2016-07-23 MED ORDER — SODIUM CHLORIDE 0.9 % IV SOLN
Freq: Once | INTRAVENOUS | Status: AC
  Administered 2016-07-23: 14:00:00 via INTRAVENOUS

## 2016-07-23 MED ORDER — SODIUM CHLORIDE 0.9 % IV SOLN
510.0000 mg | Freq: Once | INTRAVENOUS | Status: AC
  Administered 2016-07-23: 510 mg via INTRAVENOUS
  Filled 2016-07-23: qty 17

## 2016-07-23 NOTE — Patient Instructions (Signed)
Ferumoxytol injection What is this medicine? FERUMOXYTOL is an iron complex. Iron is used to make healthy red blood cells, which carry oxygen and nutrients throughout the body. This medicine is used to treat iron deficiency anemia in people with chronic kidney disease. COMMON BRAND NAME(S): Feraheme What should I tell my health care provider before I take this medicine? They need to know if you have any of these conditions: -anemia not caused by low iron levels -high levels of iron in the blood -magnetic resonance imaging (MRI) test scheduled -an unusual or allergic reaction to iron, other medicines, foods, dyes, or preservatives -pregnant or trying to get pregnant -breast-feeding How should I use this medicine? This medicine is for injection into a vein. It is given by a health care professional in a hospital or clinic setting. Talk to your pediatrician regarding the use of this medicine in children. Special care may be needed. What if I miss a dose? It is important not to miss your dose. Call your doctor or health care professional if you are unable to keep an appointment. What may interact with this medicine? This medicine may interact with the following medications: -other iron products What should I watch for while using this medicine? Visit your doctor or healthcare professional regularly. Tell your doctor or healthcare professional if your symptoms do not start to get better or if they get worse. You may need blood work done while you are taking this medicine. You may need to follow a special diet. Talk to your doctor. Foods that contain iron include: whole grains/cereals, dried fruits, beans, or peas, leafy green vegetables, and organ meats (liver, kidney). What side effects may I notice from receiving this medicine? Side effects that you should report to your doctor or health care professional as soon as possible: -allergic reactions like skin rash, itching or hives, swelling of the  face, lips, or tongue -breathing problems -changes in blood pressure -feeling faint or lightheaded, falls -fever or chills -flushing, sweating, or hot feelings -swelling of the ankles or feet Side effects that usually do not require medical attention (report to your doctor or health care professional if they continue or are bothersome): -diarrhea -headache -nausea, vomiting -stomach pain Where should I keep my medicine? This drug is given in a hospital or clinic and will not be stored at home.  2017 Elsevier/Gold Standard (2015-08-07 12:41:49)  

## 2016-07-24 DIAGNOSIS — Z9989 Dependence on other enabling machines and devices: Secondary | ICD-10-CM | POA: Diagnosis not present

## 2016-07-24 DIAGNOSIS — G4733 Obstructive sleep apnea (adult) (pediatric): Secondary | ICD-10-CM | POA: Diagnosis not present

## 2016-07-24 NOTE — Procedures (Signed)
Patient Name: Michele [name], Michele [name] Date: 07/16/2016 Gender: Female D.O.B: 1950/12/05 Age (years): 41 Referring Provider: Baird Lyons MD, ABSM Height (inches): 62 Interpreting Physician: Baird Lyons MD, ABSM Weight (lbs): 297 RPSGT: Laren Everts BMI: 5 MRN: [id number] Neck Size: 14.25 CLINICAL INFORMATION Sleep Study Type: Split Night CPAP  Indication for sleep study: COPD, Diabetes, Excessive Daytime Sleepiness, Fatigue, Hypertension, Obesity, OSA, Re-Evaluation, Sleep walking/talking/parasomnias, Snoring, Witnessed Apneas  Epworth Sleepiness Score: 12  SLEEP STUDY TECHNIQUE As per the AASM Manual for the Scoring of Sleep and Associated Events v2.3 (April 2016) with a hypopnea requiring 4% desaturations.  The channels recorded and monitored were frontal, central and occipital EEG, electrooculogram (EOG), submentalis EMG (chin), nasal and oral airflow, thoracic and abdominal wall motion, anterior tibialis EMG, snore microphone, electrocardiogram, and pulse oximetry. Continuous positive airway pressure (CPAP) was initiated when the patient met split night criteria and was titrated according to treat sleep-disordered breathing.  MEDICATIONS Medications self-administered by patient taken the night of the study : none reported  RESPIRATORY PARAMETERS Diagnostic  Total AHI (/hr): 56.9 RDI (/hr): 85.9 OA Index (/hr): 11.6 CA Index (/hr): 0.0 REM AHI (/hr): 120.0 NREM AHI (/hr): 56.0 Supine AHI (/hr): 27.4 Non-supine AHI (/hr): 66.29 Min O2 Sat (%): 80.00 Mean O2 (%): 90.57 Time below 88% (min): 30.2   Titration  Optimal Pressure (cm): 13 AHI at Optimal Pressure (/hr): 0.0 Min O2 at Optimal Pressure (%): 88.0 Supine % at Optimal (%): 56 Sleep % at Optimal (%): 99   SLEEP ARCHITECTURE The recording time for the entire night was 399.2 minutes.  During a baseline period of 152.1 minutes, the patient slept for 144.5 minutes in REM and nonREM, yielding a sleep efficiency  of 95.0%. Sleep onset after lights out was 0.6 minutes with a REM latency of 142.0 minutes. The patient spent 19.73% of the night in stage N1 sleep, 78.88% in stage N2 sleep, 0.00% in stage N3 and 1.38% in REM.  During the titration period of 232.0 minutes, the patient slept for 222.5 minutes in REM and nonREM, yielding a sleep efficiency of 95.9%. Sleep onset after CPAP initiation was 2.9 minutes with a REM latency of 92.5 minutes. The patient spent 14.15% of the night in stage N1 sleep, 63.60% in stage N2 sleep, 0.00% in stage N3 and 22.24% in REM.  CARDIAC DATA The 2 lead EKG demonstrated sinus rhythm. The mean heart rate was 63.81 beats per minute. Other EKG findings include: PVCs.  LEG MOVEMENT DATA The total Periodic Limb Movements of Sleep (PLMS) were 2. The PLMS index was 0.32 .  IMPRESSIONS - Severe obstructive sleep apnea occurred during the diagnostic portion of the study (AHI = 56.9/hour). An optimal PAP pressure was selected for this patient ( 13 cm of water) - No significant central sleep apnea occurred during the diagnostic portion of the study (CAI = 0.0/hour). - Moderate oxygen desaturation was noted during the diagnostic portion of the study (Min O2 =80.00%). - The patient snored with Loud snoring volume during the diagnostic portion of the study. - EKG findings include PVCs. - Clinically significant periodic limb movements did not occur during sleep.  DIAGNOSIS - Obstructive Sleep Apnea (327.23 [G47.33 ICD-10])  RECOMMENDATIONS - Trial of CPAP therapy on 13 cm H2O with a Medium size Philips Respironics Full Face Mask Amara View mask and heated humidification. - Avoid alcohol, sedatives and other CNS depressants that may worsen sleep apnea and disrupt normal sleep architecture. - Sleep hygiene should be reviewed to assess factors that  may improve sleep quality. - Weight management and regular exercise should be initiated or continued.  [Electronically signed] 07/24/2016  10:46 AM  Baird Lyons MD, ABSM Diplomate, American Board of Sleep Medicine   NPI: [id number]  [place], American Board of Sleep Medicine  ELECTRONICALLY SIGNED ON:  07/24/2016, 10:44 AM [place] PH: [phone]   FX: [phone] [place]

## 2016-08-05 ENCOUNTER — Ambulatory Visit (HOSPITAL_COMMUNITY): Payer: Self-pay | Admitting: Psychology

## 2016-08-11 ENCOUNTER — Other Ambulatory Visit (HOSPITAL_BASED_OUTPATIENT_CLINIC_OR_DEPARTMENT_OTHER): Payer: Self-pay | Admitting: Internal Medicine

## 2016-08-18 ENCOUNTER — Ambulatory Visit (INDEPENDENT_AMBULATORY_CARE_PROVIDER_SITE_OTHER): Payer: Medicare Other | Admitting: Pharmacist

## 2016-08-18 DIAGNOSIS — I480 Paroxysmal atrial fibrillation: Secondary | ICD-10-CM

## 2016-08-18 DIAGNOSIS — Z7901 Long term (current) use of anticoagulants: Secondary | ICD-10-CM | POA: Diagnosis not present

## 2016-08-18 LAB — POCT INR: INR: 5.4

## 2016-08-19 ENCOUNTER — Ambulatory Visit (INDEPENDENT_AMBULATORY_CARE_PROVIDER_SITE_OTHER): Payer: No Typology Code available for payment source | Admitting: Psychology

## 2016-08-19 DIAGNOSIS — F4310 Post-traumatic stress disorder, unspecified: Secondary | ICD-10-CM

## 2016-08-19 DIAGNOSIS — F331 Major depressive disorder, recurrent, moderate: Secondary | ICD-10-CM

## 2016-08-19 DIAGNOSIS — R0602 Shortness of breath: Secondary | ICD-10-CM | POA: Diagnosis not present

## 2016-08-19 NOTE — Progress Notes (Signed)
   THERAPIST PROGRESS NOTE  Session Time: 12.30pm-1.30pm  Participation Level: Active  Behavioral Response: Well GroomedAlertaffect wNL.  Pt reports feeling more anxious today.  Pt cognition slow.  Type of Therapy: Individual Therapy  Treatment Goals addressed: Diagnosis: MDD, PTSD and goal 1.  Interventions: CBT, Supportive and Other: tx planning  Summary: Michele [name] is a 66 y.o. female who presents with affect WNL.  Pt reported feeling more anxious today.  Pt reports that she has been feeling more anxious when having to go places- thinking about walking and shortness of breath and getting to multiple appointments.  Pt reports that she did go to PCP last session and felt that she was very watchful and received good care.  Pt doesn't recall PCP thoughts about her dizziness and fall.  Pt thought process is slow- at times on a tangent.  Pt is able to be redirected and start identifying goals for her care.  See tx plan. Pt expresses that not finding a lot of joy or meaning for self day to day- doesn't want to live in the past- but be present to today. Pt isn't sure about working on trauma focused as discusses how she likes to avoid that but is aware the struggles w/ trust and self worth- body image is related. .   Suicidal/Homicidal: Nowithout intent/plan  Therapist Response: Assessed pt current functioning per pt report.  F/u re: last session and visit w/ PCP.  Explored w/pt increased anxiety and worries.  Discussed pt tx goals and continued to redirect pt to identify her goals for herself in counseling now.  Discussed concerns and how related to PTSD and opportunity for trauma focused therapy.    Plan: Return again in 2 weeks. Pt and counselor to complete trauma inventory and PCL 5- further discuss trauma therapy.  Diagnosis: MDD, PtSD    [name], [facility] 08/19/2016

## 2016-08-27 ENCOUNTER — Ambulatory Visit (INDEPENDENT_AMBULATORY_CARE_PROVIDER_SITE_OTHER): Payer: Medicare Other | Admitting: Pharmacist

## 2016-08-27 DIAGNOSIS — Z7901 Long term (current) use of anticoagulants: Secondary | ICD-10-CM | POA: Diagnosis not present

## 2016-08-27 DIAGNOSIS — I480 Paroxysmal atrial fibrillation: Secondary | ICD-10-CM | POA: Diagnosis not present

## 2016-08-27 LAB — POCT INR: INR: 2.4

## 2016-09-01 DIAGNOSIS — Z9981 Dependence on supplemental oxygen: Secondary | ICD-10-CM | POA: Diagnosis not present

## 2016-09-01 DIAGNOSIS — R0600 Dyspnea, unspecified: Secondary | ICD-10-CM | POA: Diagnosis not present

## 2016-09-02 ENCOUNTER — Ambulatory Visit (INDEPENDENT_AMBULATORY_CARE_PROVIDER_SITE_OTHER): Payer: No Typology Code available for payment source | Admitting: Psychology

## 2016-09-02 DIAGNOSIS — F4310 Post-traumatic stress disorder, unspecified: Secondary | ICD-10-CM | POA: Diagnosis not present

## 2016-09-02 DIAGNOSIS — F331 Major depressive disorder, recurrent, moderate: Secondary | ICD-10-CM
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
## 2016-09-06 ENCOUNTER — Other Ambulatory Visit: Payer: Self-pay | Admitting: Emergency Medicine

## 2016-09-06 NOTE — Telephone Encounter (Signed)
10/2015 last ov with daub

## 2016-09-13 ENCOUNTER — Ambulatory Visit (INDEPENDENT_AMBULATORY_CARE_PROVIDER_SITE_OTHER): Payer: Medicare Other | Admitting: Pharmacist

## 2016-09-13 DIAGNOSIS — Z7901 Long term (current) use of anticoagulants: Secondary | ICD-10-CM | POA: Diagnosis not present

## 2016-09-13 DIAGNOSIS — J449 Chronic obstructive pulmonary disease, unspecified: Secondary | ICD-10-CM | POA: Diagnosis not present

## 2016-09-13 DIAGNOSIS — I480 Paroxysmal atrial fibrillation: Secondary | ICD-10-CM | POA: Diagnosis not present

## 2016-09-13 LAB — POCT INR: INR: 3.6

## 2016-09-16 ENCOUNTER — Telehealth (HOSPITAL_COMMUNITY): Payer: Self-pay | Admitting: Psychology

## 2016-09-16 ENCOUNTER — Ambulatory Visit (INDEPENDENT_AMBULATORY_CARE_PROVIDER_SITE_OTHER): Payer: No Typology Code available for payment source | Admitting: Psychology

## 2016-09-16 DIAGNOSIS — F4310 Post-traumatic stress disorder, unspecified: Secondary | ICD-10-CM | POA: Diagnosis not present

## 2016-09-16 NOTE — Telephone Encounter (Signed)
Pt called and informed that she isn't sure she can make to appointment today.  She has started getting ready to come to her 12.30pm appointment and is not sure how to manage w/ her oxygen tank and tubing.  Pt reported that she felt that she was panicking about this.  Pt reported that her sisters aren't available to help as went to [place] today.  Pt reported that she still wants to come and is going to see what she can figure out.  Pt reports she is getting another call and that she will call back if unable to attend.
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
## 2016-09-23 DIAGNOSIS — H2513 Age-related nuclear cataract, bilateral: Secondary | ICD-10-CM | POA: Diagnosis not present

## 2016-09-23 DIAGNOSIS — E119 Type 2 diabetes mellitus without complications: Secondary | ICD-10-CM | POA: Diagnosis not present

## 2016-09-23 DIAGNOSIS — H43811 Vitreous degeneration, right eye: Secondary | ICD-10-CM | POA: Diagnosis not present

## 2016-09-29 ENCOUNTER — Encounter (HOSPITAL_COMMUNITY): Payer: Self-pay | Admitting: Psychiatry

## 2016-09-29 ENCOUNTER — Other Ambulatory Visit: Payer: Self-pay | Admitting: Physician Assistant

## 2016-09-29 ENCOUNTER — Ambulatory Visit (INDEPENDENT_AMBULATORY_CARE_PROVIDER_SITE_OTHER): Payer: No Typology Code available for payment source | Admitting: Psychiatry

## 2016-09-29 ENCOUNTER — Ambulatory Visit (INDEPENDENT_AMBULATORY_CARE_PROVIDER_SITE_OTHER): Payer: Medicare Other | Admitting: Pharmacist Clinician (PhC)/ Clinical Pharmacy Specialist

## 2016-09-29 DIAGNOSIS — Z818 Family history of other mental and behavioral disorders: Secondary | ICD-10-CM | POA: Diagnosis not present

## 2016-09-29 DIAGNOSIS — Z7901 Long term (current) use of anticoagulants: Secondary | ICD-10-CM

## 2016-09-29 DIAGNOSIS — I480 Paroxysmal atrial fibrillation: Secondary | ICD-10-CM

## 2016-09-29 DIAGNOSIS — F331 Major depressive disorder, recurrent, moderate: Secondary | ICD-10-CM

## 2016-09-29 LAB — POCT INR: INR: 1.5

## 2016-09-29 MED ORDER — ARIPIPRAZOLE 5 MG PO TABS: ORAL_TABLET | ORAL | 0 refills | Status: DC

## 2016-09-29 MED ORDER — BENZTROPINE MESYLATE 0.5 MG PO TABS: 0.5000 mg | ORAL_TABLET | Freq: Every day | ORAL | 0 refills | Status: DC

## 2016-09-29 MED ORDER — SERTRALINE HCL 100 MG PO TABS: ORAL_TABLET | ORAL | 0 refills | Status: DC
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
## 2016-09-30 ENCOUNTER — Ambulatory Visit (INDEPENDENT_AMBULATORY_CARE_PROVIDER_SITE_OTHER): Payer: No Typology Code available for payment source | Admitting: Psychology

## 2016-09-30 DIAGNOSIS — F4310 Post-traumatic stress disorder, unspecified: Secondary | ICD-10-CM

## 2016-09-30 DIAGNOSIS — F331 Major depressive disorder, recurrent, moderate: Secondary | ICD-10-CM

## 2016-09-30 NOTE — Progress Notes (Signed)
   THERAPIST PROGRESS NOTE  Session Time: 12.32pm-1.22pm  Participation Level: Active  Behavioral Response: Well GroomedAlertDepressed  Type of Therapy: Individual Therapy  Treatment Goals addressed: Diagnosis: PTSD, MDD and goal 1.  Interventions: CBT, Supportive and Other: CPT  Summary: Michele [name] is a 66 y.o. female who presents with report of depressed mood.  Pt reported that she didn't complete her practice assignments for CPT.  Pt reported when she did attempt over past week she felt more overwhelmed and increased thoughts re: past and doesn't want to experience this.  Pt acknowledged avoidance and discussed that not ready for this tx at this time- wants to put energy elsewhere.  Pt discussed grief of mom- March has reminders of mom's birthday and month of her death.  Pt discussed how mom was so important to her support and was able to talk w/ her about things she won't talk w/ anyone else about.  Pt reported that she does get some support from siblings.  Pt doesn't feel comfortable disturbing mom's peace in after life to pray or "talk" to her.  Pt was able to identify the people in her life that she feels most likely to share and connect with and agreed that would be helpful to increase her contact w/ these individuals.   Suicidal/Homicidal: Nowithout intent/plan  Therapist Response: Assessed pt current functioning per pt report. Explored w/pt her avoidance and resistance to trauma focused work and provided education of the role of trauma focused to working through to reduced the ptsd symptoms.  Discussed what pt is wanting to focus on.  Provided support re: grief re: mom and supports system- how to be proactive about connecting w/.   Plan: Return again in 2 weeks.  Diagnosis: PTSD    [name],[name], [place] 09/30/2016

## 2016-10-11 ENCOUNTER — Other Ambulatory Visit (HOSPITAL_BASED_OUTPATIENT_CLINIC_OR_DEPARTMENT_OTHER): Payer: Medicare Other

## 2016-10-11 DIAGNOSIS — D508 Other iron deficiency anemias: Secondary | ICD-10-CM

## 2016-10-11 DIAGNOSIS — J449 Chronic obstructive pulmonary disease, unspecified: Secondary | ICD-10-CM | POA: Diagnosis not present

## 2016-10-11 DIAGNOSIS — D509 Iron deficiency anemia, unspecified: Secondary | ICD-10-CM | POA: Diagnosis not present

## 2016-10-11 LAB — CBC WITH DIFFERENTIAL/PLATELET
BASO%: 1.1 % (ref 0.0–2.0)
Basophils Absolute: 0.1 10*3/uL (ref 0.0–0.1)
EOS%: 5.4 % (ref 0.0–7.0)
Eosinophils Absolute: 0.4 10*3/uL (ref 0.0–0.5)
HEMATOCRIT: 35.1 % (ref 34.8–46.6)
HGB: 11 g/dL — ABNORMAL LOW (ref 11.6–15.9)
LYMPH#: 2.2 10*3/uL (ref 0.9–3.3)
LYMPH%: 29.3 % (ref 14.0–49.7)
MCH: 21.2 pg — ABNORMAL LOW (ref 25.1–34.0)
MCHC: 31.3 g/dL — ABNORMAL LOW (ref 31.5–36.0)
MCV: 67.8 fL — ABNORMAL LOW (ref 79.5–101.0)
MONO#: 0.7 10*3/uL (ref 0.1–0.9)
MONO%: 9.1 % (ref 0.0–14.0)
NEUT%: 55.1 % (ref 38.4–76.8)
NEUTROS ABS: 4.1 10*3/uL (ref 1.5–6.5)
PLATELETS: 261 10*3/uL (ref 145–400)
RBC: 5.18 10*6/uL (ref 3.70–5.45)
RDW: 19 % — AB (ref 11.2–14.5)
WBC: 7.4 10*3/uL (ref 3.9–10.3)

## 2016-10-11 LAB — IRON AND TIBC
%SAT: 23 % (ref 21–57)
Iron: 60 ug/dL (ref 41–142)
TIBC: 264 ug/dL (ref 236–444)
UIBC: 204 ug/dL (ref 120–384)

## 2016-10-11 LAB — FERRITIN: Ferritin: 785 ng/ml — ABNORMAL HIGH (ref 9–269)

## 2016-10-14 ENCOUNTER — Ambulatory Visit (INDEPENDENT_AMBULATORY_CARE_PROVIDER_SITE_OTHER): Payer: No Typology Code available for payment source | Admitting: Psychology

## 2016-10-14 DIAGNOSIS — F330 Major depressive disorder, recurrent, mild: Secondary | ICD-10-CM | POA: Diagnosis not present

## 2016-10-14 NOTE — Progress Notes (Signed)
   THERAPIST PROGRESS NOTE  Session Time: 12.30pm-1.23pm  Participation Level: Active  Behavioral Response: Well GroomedAlertaffect wnl  Type of Therapy: Individual Therapy  Treatment Goals addressed: Diagnosis: MDD and goal 1.  Interventions: CBT and Supportive  Summary: Michele [name] is a 66 y.o. female who presents with report of mood improved this past week.  Pt reports it has been a good week and focused on gratitude.  Pt discusses recognizing how quickly time has passed and grandkids growing up and feeling that has missed time.  Pt acknowledges importance of staying present and enjoying present moments.  Pt discussed family connections and hx- pt discussed Easter reminder of mother's death- but focusing on family interactions that day .   Suicidal/Homicidal: Nowithout intent/plan  Therapist Response: Assessed pt current functioning per pt report.  Processed w/pt coping w/ depressed mood and grief.  Encouraged present moment interactions and gratitude.   Plan: Return again in 2 weeks.  Diagnosis: MDD, mild    [name],[name], LPC 10/14/2016

## 2016-10-15 ENCOUNTER — Ambulatory Visit (INDEPENDENT_AMBULATORY_CARE_PROVIDER_SITE_OTHER): Payer: Medicare Other | Admitting: Pharmacist Clinician (PhC)/ Clinical Pharmacy Specialist

## 2016-10-15 DIAGNOSIS — I480 Paroxysmal atrial fibrillation: Secondary | ICD-10-CM | POA: Diagnosis not present

## 2016-10-15 DIAGNOSIS — Z7901 Long term (current) use of anticoagulants: Secondary | ICD-10-CM

## 2016-10-15 LAB — POCT INR: INR: 1.2

## 2016-10-18 ENCOUNTER — Telehealth: Payer: Self-pay | Admitting: Internal Medicine

## 2016-10-18 ENCOUNTER — Ambulatory Visit (HOSPITAL_BASED_OUTPATIENT_CLINIC_OR_DEPARTMENT_OTHER): Payer: Medicare Other | Admitting: Internal Medicine

## 2016-10-18 ENCOUNTER — Encounter: Payer: Self-pay | Admitting: Internal Medicine

## 2016-10-18 VITALS — BP 132/64 | HR 60 | Temp 97.7°F | Resp 18 | Ht 62.0 in | Wt 295.3 lb

## 2016-10-18 DIAGNOSIS — D509 Iron deficiency anemia, unspecified: Secondary | ICD-10-CM

## 2016-10-18 DIAGNOSIS — D508 Other iron deficiency anemias: Secondary | ICD-10-CM

## 2016-10-18 MED ORDER — ALBUTEROL SULFATE HFA 108 (90 BASE) MCG/ACT IN AERS: 1.0000 | INHALATION_SPRAY | Freq: Four times a day (QID) | RESPIRATORY_TRACT | 4 refills | Status: DC | PRN

## 2016-10-18 NOTE — Telephone Encounter (Signed)
Gave patient AVS and calender per 10/18/2016 los.  

## 2016-10-18 NOTE — Progress Notes (Signed)
[facility] [phone]   [phone]  OFFICE PROGRESS NOTE  [name], [address] [place] [address]  DIAGNOSIS: Unspecified anemia questionable for anemia of chronic disease/iron deficiency.   PRIOR THERAPY: Status post Feraheme infusion 500 mg IV weekly 2 doses , last dose was given 07/24/2016.  CURRENT THERAPY: Over-the-counter oral iron tablets. 1 tablets daily 2-3 times a week.  INTERVAL HISTORY: Michele [name] 66 y.o. female returns to the clinic today for 80-month follow-up visit. The patient is feeling fine today was no specific complaints except for the shortness of breath with exertion. She denied having any chest pain, cough or hemoptysis. She continues to have mild fatigue but much better than before. She tolerated the previous Feraheme infusion fairly well. She is currently on oral iron tablets 1 tablet daily. She denied having any nausea or vomiting, she has no fever or constipation. She had repeat iron study performed recently and she is here for evaluation and discussion of her lab results.  MEDICAL HISTORY: Past Medical History:  Diagnosis Date  . Allergic rhinitis   . 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 Asthma   . CAD (coronary artery disease)   . CAD (coronary artery disease) 01/30/2007   stents trivial coronary artery disease diffusely, with a recent deployment od a intracoronary artery stent, 3.5 x 12 mm driver stent with no more than 20-30% in- stents restenosis.done by Dr [name] with re-look on novenber 10 2008 revealing a widely patent stent with otherwise trival CAD and normal LV function  . Carpal tunnel syndrome, right   . Chest pain 07/17/12009   2 D Echo EF >55%  . Depression   . Diabetes mellitus type II   . Gait instability   . Hyperlipidemia   . Hypertension   . Iron deficiency anemia 07/13/2016  . Obesity   . Paroxysmal a-fib ([place])   . PTSD (post-traumatic  stress disorder)   . Sleep apnea     ALLERGIES:  is allergic to meprobamate.  MEDICATIONS:  Current Outpatient Prescriptions  Medication Sig Dispense Refill  . ADVAIR DISKUS 250-50 MCG/DOSE AEPB INHALE ONE PUFF TWICE DAILY 60 each 5  . albuterol (PROVENTIL) (2.5 MG/3ML) 0.083% nebulizer solution Take 3 mLs (2.5 mg total) by nebulization every 6 (six) hours as needed. 360 mL 2  . albuterol (VENTOLIN HFA) 108 (90 Base) MCG/ACT inhaler Inhale 1 puff into the lungs every 6 (six) hours as needed for wheezing or shortness of breath. 18 g 4  . ARIPiprazole (ABILIFY) 5 MG tablet TAKE ONE (1) TABLET BY MOUTH EVERY DAY 90 tablet 0  . aspirin 81 MG tablet Take 81 mg by mouth daily.      . benztropine (COGENTIN) 0.5 MG tablet Take 1 tablet (0.5 mg total) by mouth at bedtime. 90 tablet 0  . diltiazem (CARDIZEM CD) 180 MG 24 hr capsule Take 1 capsule (180 mg total) by mouth daily. 90 capsule 3  . fexofenadine (ALLEGRA) 60 MG tablet Take 1 tablet (60 mg total) by mouth daily. Take 60 mg by mouth daily. 30 tablet 11  . furosemide (LASIX) 40 MG tablet TAKE ONE (1) TABLET BY MOUTH EVERY DAY 30 tablet 9  . gabapentin (NEURONTIN) 300 MG capsule Take 300 mg by mouth at bedtime.  0  . HYDROcodone-acetaminophen (NORCO) 10-325 MG per tablet Take 1 tablet by mouth every 6 (six) hours as needed.  0  .  irbesartan (AVAPRO) 150 MG tablet TAKE ONE (1) TABLET BY MOUTH EVERY DAY 30 tablet 0  . isosorbide mononitrate (IMDUR) 30 MG 24 hr tablet TAKE ONE (1) TABLET BY MOUTH EVERY DAY 90 tablet 3  . metFORMIN (GLUCOPHAGE-XR) 500 MG 24 hr tablet TAKE ONE (1) TABLET BY MOUTH EVERY DAY WITH BREAKFAST 90 tablet 3  . metoprolol (LOPRESSOR) 100 MG tablet TAKE 1 TABLET BY MOUTH 2 TIMES DAILY 180 tablet 3  . mometasone (NASONEX) 50 MCG/ACT nasal spray Place 2 sprays into the nose daily. 17 g 2  . pantoprazole (PROTONIX) 20 MG tablet Take 20 mg by mouth daily.    . rosuvastatin (CRESTOR) 10 MG tablet TAKE ONE (1) TABLET BY MOUTH EVERY  DAY 90 tablet 1  . sertraline (ZOLOFT) 100 MG tablet TAKE TWO (2) TABLETS BY MOUTH DAILY 180 tablet 0  . Trospium Chloride 60 MG CP24 TAKE ONE CAPSULE BY MOUTH DAILY 90 each 1  . warfarin (COUMADIN) 7.5 MG tablet TAKE ONE AND ONE-HALF (1 & 1/2) TO TWO (2) TABLETS BY MOUTH DAILY AS DIRECTED 165 tablet 0  . nitroGLYCERIN (NITROSTAT) 0.4 MG SL tablet Place 1 tablet (0.4 mg total) under the tongue every 5 (five) minutes as needed. (Patient not taking: Reported on 10/18/2016) 25 tablet 3   No current facility-administered medications for this visit.     SURGICAL HISTORY:  Past Surgical History:  Procedure Laterality Date  . CHOLECYSTECTOMY    . GALLBLADDER SURGERY    . HEMORRHOID SURGERY    . Kidney stones    . Left groin cyst    . multilevel lumbar degenerative disc disease    . stress myocardial perfusion study  09/08/2010   normal; EF 75%    REVIEW OF SYSTEMS:  A comprehensive review of systems was negative except for: Constitutional: positive for fatigue Respiratory: positive for dyspnea on exertion Musculoskeletal: positive for arthralgias   PHYSICAL EXAMINATION: General appearance: alert, cooperative, fatigued and no distress Head: Normocephalic, without obvious abnormality, atraumatic Neck: no adenopathy, no JVD, supple, symmetrical, trachea midline and thyroid not enlarged, symmetric, no tenderness/mass/nodules Lymph nodes: Cervical, supraclavicular, and axillary nodes normal. Resp: clear to auscultation bilaterally Back: symmetric, no curvature. ROM normal. No CVA tenderness. Cardio: regular rate and rhythm, S1, S2 normal, no murmur, click, rub or gallop GI: soft, non-tender; bowel sounds normal; no masses,  no organomegaly Extremities: extremities normal, atraumatic, no cyanosis or edema  ECOG PERFORMANCE STATUS: 1 - Symptomatic but completely ambulatory  Blood pressure 132/64, pulse 60, temperature 97.7 F (36.5 C), temperature source Oral, resp. rate 18, height 5\' 2"  (1.575  m), weight 295 lb 4.8 oz (133.9 kg), SpO2 92 %.  LABORATORY DATA: Lab Results  Component Value Date   WBC 7.4 10/11/2016   HGB 11.0 (L) 10/11/2016   HCT 35.1 10/11/2016   MCV 67.8 (L) 10/11/2016   PLT 261 10/11/2016      Chemistry      Component Value Date/Time   NA 139 07/31/2015 1305   NA 144 10/02/2012 0905   K 3.9 07/31/2015 1305   K 3.4 (L) 10/02/2012 0905   CL 97 07/31/2015 1305   CL 101 10/02/2012 0905   CO2 32 07/31/2015 1305   CO2 31 (H) 10/02/2012 0905   BUN 21 07/31/2015 1305   BUN 13.9 10/02/2012 0905   CREATININE 0.83 07/31/2015 1305   CREATININE 0.77 12/08/2014 1153   CREATININE 0.8 10/02/2012 0905      Component Value Date/Time   CALCIUM 10.2 07/31/2015  1305   CALCIUM 9.9 10/02/2012 0905   ALKPHOS 93 11/25/2015 0841   AST 16 11/25/2015 0841   ALT 19 11/25/2015 0841   BILITOT 0.5 11/25/2015 0841     Iron study: Serum iron 60, total iron binding capacity 264, iron saturation 23%. Serum ferritin 785.  RADIOGRAPHIC STUDIES: No results found.  ASSESSMENT AND PLAN:  This is a very pleasant 67 years old African-American female with iron deficiency anemia plus/minus anemia of chronic disease status post Feraheme infusion and she is currently on oral iron tablets once daily. The patient is feeling much better. Her hemoglobin and hematocrit as well as the iron study are much better compared to 3 months ago. I discussed the lab result with the patient today. I recommended for her to continue on the oral iron tablets for now. I will see her back for follow-up visit in 6 months for evaluation with repeat CBC, iron study and ferritin. She was advised to call immediately if she has any concerning symptoms in the interval. The patient voices understanding of current disease status and treatment options and is in agreement with the current care plan. All questions were answered. The patient knows to call the clinic with any problems, questions or concerns. We can  certainly see the patient much sooner if necessary. I spent 10 minutes counseling the patient face to face. The total time spent in the appointment was 15 minutes.  Disclaimer: This note was dictated with voice recognition software. Similar sounding words can inadvertently be transcribed and may not be corrected upon review.

## 2016-10-28 ENCOUNTER — Ambulatory Visit (INDEPENDENT_AMBULATORY_CARE_PROVIDER_SITE_OTHER): Payer: No Typology Code available for payment source | Admitting: Psychology

## 2016-10-28 DIAGNOSIS — F330 Major depressive disorder, recurrent, mild: Secondary | ICD-10-CM | POA: Diagnosis not present

## 2016-10-28 NOTE — Progress Notes (Signed)
   THERAPIST PROGRESS NOTE  Session Time: 1.30pm-2.22pm  Participation Level: Active  Behavioral Response: Well GroomedAlertaffect wnl  Type of Therapy: Individual Therapy  Treatment Goals addressed: Diagnosis: MDD and goal 1.  Interventions: CBT and Supportive  Summary: Michele [name] is a 66 y.o. female who presents with affect wnl.  Pt reported that over the past 2 weeks mood has been ok and that engaged w/ her supports.  Pt reported that she recognizes struggle more w/ recall, remembering things and processing w/ mails she gets.  Pt acknowledges support she has from her daughter to assist.     Suicidal/Homicidal: Nowithout intent/plan  Therapist Response: Assessed pt current functioning per pt report.  Processed w/ pt mood and positive effect of engagement w/ supports.  discussed utilizing supports for stressors and continuing w/ use of reading, word games etc to assist w/ maintaining cognitive processes.   Plan: Return again in 2 weeks.  Diagnosis: MDD, recurrent, mild  [name],[name], LPC 10/28/2016

## 2016-10-29 ENCOUNTER — Other Ambulatory Visit (HOSPITAL_COMMUNITY): Payer: Self-pay | Admitting: Psychiatry

## 2016-10-29 ENCOUNTER — Other Ambulatory Visit: Payer: Self-pay | Admitting: Physician Assistant

## 2016-10-29 DIAGNOSIS — F331 Major depressive disorder, recurrent, moderate: Secondary | ICD-10-CM

## 2016-11-02 ENCOUNTER — Other Ambulatory Visit: Payer: Self-pay | Admitting: Emergency Medicine

## 2016-11-10 ENCOUNTER — Other Ambulatory Visit: Payer: Self-pay | Admitting: Physician Assistant

## 2016-11-11 ENCOUNTER — Encounter (HOSPITAL_COMMUNITY): Payer: Self-pay | Admitting: Psychology

## 2016-11-11 ENCOUNTER — Ambulatory Visit (HOSPITAL_COMMUNITY): Payer: Self-pay | Admitting: Psychology

## 2016-11-11 DIAGNOSIS — J449 Chronic obstructive pulmonary disease, unspecified: Secondary | ICD-10-CM | POA: Diagnosis not present

## 2016-11-11 NOTE — Progress Notes (Signed)
Michele [name] is a 66 y.o. female patient who didn't show for her appointment.  Letter sent.        [name], LPC

## 2016-11-17 ENCOUNTER — Ambulatory Visit (INDEPENDENT_AMBULATORY_CARE_PROVIDER_SITE_OTHER): Payer: Medicare Other | Admitting: Nurse Practitioner

## 2016-11-17 ENCOUNTER — Encounter (INDEPENDENT_AMBULATORY_CARE_PROVIDER_SITE_OTHER): Payer: Self-pay

## 2016-11-17 ENCOUNTER — Encounter: Payer: Self-pay | Admitting: Nurse Practitioner

## 2016-11-17 VITALS — BP 113/62 | HR 64 | Ht 62.0 in

## 2016-11-17 DIAGNOSIS — I480 Paroxysmal atrial fibrillation: Secondary | ICD-10-CM | POA: Diagnosis not present

## 2016-11-17 DIAGNOSIS — I10 Essential (primary) hypertension: Secondary | ICD-10-CM | POA: Diagnosis not present

## 2016-11-17 DIAGNOSIS — I6330 Cerebral infarction due to thrombosis of unspecified cerebral artery: Secondary | ICD-10-CM

## 2016-11-17 DIAGNOSIS — M4802 Spinal stenosis, cervical region: Secondary | ICD-10-CM

## 2016-11-17 DIAGNOSIS — G4733 Obstructive sleep apnea (adult) (pediatric): Secondary | ICD-10-CM | POA: Diagnosis not present

## 2016-11-17 DIAGNOSIS — R269 Unspecified abnormalities of gait and mobility: Secondary | ICD-10-CM | POA: Diagnosis not present

## 2016-11-17 NOTE — Patient Instructions (Addendum)
ontinue Coumadin for secondary stroke prevention and history of paroxysmal atrial fibrillation Continue Crestor for hyperlipedemia  Continue  and home exercise program Blood pressure in the office today 113/72, make sure you stay well hydrated Risk for falls due to your gait abnormality use cane at all times Continue follow up with Dr. Nelva Bush for pain management Continue Oxygen 2-4 L nasal cannula Continue to follow up with Dr. Tye Savoy primary care stroke risk management Continue seeing counselor and Dr. are seen Discharge from neurologic clinic  Stroke Prevention Some medical conditions and behaviors are associated with an increased chance of having a stroke. You may prevent a stroke by making healthy choices and managing medical conditions. How can I reduce my risk of having a stroke?  Stay physically active. Get at least 30 minutes of activity on most or all days.  Do not smoke. It may also be helpful to avoid exposure to secondhand smoke.  Limit alcohol use. Moderate alcohol use is considered to be:  No more than 2 drinks per day for men.  No more than 1 drink per day for nonpregnant women.  Eat healthy foods. This involves:  Eating 5 or more servings of fruits and vegetables a day.  Making dietary changes that address high blood pressure (hypertension), high cholesterol, diabetes, or obesity.  Manage your cholesterol levels.  Making food choices that are high in fiber and low in saturated fat, trans fat, and cholesterol may control cholesterol levels.  Take any prescribed medicines to control cholesterol as directed by your health care provider.  Manage your diabetes.  Controlling your carbohydrate and sugar intake is recommended to manage diabetes.  Take any prescribed medicines to control diabetes as directed by your health care provider.  Control your hypertension.  Making food choices that are low in salt (sodium), saturated fat, trans fat, and cholesterol is  recommended to manage hypertension.  Ask your health care provider if you need treatment to lower your blood pressure. Take any prescribed medicines to control hypertension as directed by your health care provider.  If you are 81-20 years of age, have your blood pressure checked every 3-5 years. If you are 57 years of age or older, have your blood pressure checked every year.  Maintain a healthy weight.  Reducing calorie intake and making food choices that are low in sodium, saturated fat, trans fat, and cholesterol are recommended to manage weight.  Stop drug abuse.  Avoid taking birth control pills.  Talk to your health care provider about the risks of taking birth control pills if you are over 12 years old, smoke, get migraines, or have ever had a blood clot.  Get evaluated for sleep disorders (sleep apnea).  Talk to your health care provider about getting a sleep evaluation if you snore a lot or have excessive sleepiness.  Take medicines only as directed by your health care provider.  For some people, aspirin or blood thinners (anticoagulants) are helpful in reducing the risk of forming abnormal blood clots that can lead to stroke. If you have the irregular heart rhythm of atrial fibrillation, you should be on a blood thinner unless there is a good reason you cannot take them.  Understand all your medicine instructions.  Make sure that other conditions (such as anemia or atherosclerosis) are addressed. Get help right away if:  You have sudden weakness or numbness of the face, arm, or leg, especially on one side of the body.  Your face or eyelid droops to one side.  You have sudden confusion.  You have trouble speaking (aphasia) or understanding.  You have sudden trouble seeing in one or both eyes.  You have sudden trouble walking.  You have dizziness.  You have a loss of balance or coordination.  You have a sudden, severe headache with no known cause.  You have new  chest pain or an irregular heartbeat. Any of these symptoms may represent a serious problem that is an emergency. Do not wait to see if the symptoms will go away. Get medical help at once. Call your local emergency services (911 in [place] Do not drive yourself to the hospital. This information is not intended to replace advice given to you by your health care provider. Make sure you discuss any questions you have with your health care provider. Document Released: 08/12/2004 Document Revised: 12/11/2015 Document Reviewed: 01/05/2013 Elsevier Interactive Patient Education  2017 [employer].

## 2016-11-17 NOTE — Progress Notes (Signed)
PATIENT: Michele [name] DOB: Mar 12, 1951  REASON FOR VISIT: Follow-up for spinal stenosis in the cervical region, gait abnormality, history of stroke HISTORY FROM: Patient and daughter Michele [name]    HISTORY OF PRESENT ILLNESS:Michele [name] is a 66 years old right-handed female, accompanied by her sister Michele [name], seen in refer by her primary care physician Dr. Darlyne [name] in May 12 2015 for evaluation of unsteady gait, falling episodes  She has past medical history of hypertension, hyperlipidemia, borderline diabetes, coronary artery disease, status post a stent, atrial fibrillation, take Coumadin, also under the care of psychologist for PTSD, on polypharmacy, this including Abilify 15 mg daily, Zoloft 100x2 mg daily.  She had gradual onset unsteady gait since 2015, getting worse over the past couple months, fell twice in October 7 and April 27 2015, her right leg gave out, she also complains of right arm weakness, she has anemia, received iron infusion, she also complains of irritable bowel syndrome, she also complains of bowel and bladder incontinence, complains of right arm, flank, right leg pain, bilateral knee pain, bilateral lower extremity deep achy pain,  I have personally reviewed CAT scan April 28 2015: Periventricular small vessel disease, no acute lesions.  UPDATE May 22 2015: She came with her daughter at today's clinical visit, walking with a cane.  She is most frustrated by her frequent falling episode, mild headaches, bilateral knee pain.  She also complain of bladder incontinence, urgency, wears pads since 2014,  She has occasional bowel accident.  She denies significant neck pain.   We have reviewed MRI films in May 15 2015: MRI brain: Acute-subacute ischemic infarction in the left internal capsule (posterior limb) and basal ganglia. It measures 108m in diameter. moderate chronic small vessel disease. MRI cervical: Multilevel degenerative disc disease, mild  to moderate canal stenosis multilevel foraminal stenosis at different levels. There was mild edema in the intraspinal ligament of C5-6 level, no spinal cord signal changes,   UPDATE Nov 19 2015: She continued to complains of difficulty walking, notice bilateral feet swelling, has finished physical therapy which has been helpful We have again reviewed MRI of the brain in October 2016, evidence of acute infarction in the left posterior limb of internal capsule and basal ganglion, moderate supratentorial and small vessel disease. She is now taking aspirin 81 mg daily with history of coronary artery disease and also coumadin for stroke paroxysmal atrial fibrillation UPDATE November 2 2017CM Ms. [name], 66year old female returns for follow-up. She has had several recent falls and she feels her knee gives out on the left side. She has just begun in health physical therapy and occupational therapy approximately 2 weeks ago. She has a history of left posterior limb internal capsule and basal ganglion infarct with small vessel disease. In addition she has a history of paroxysmal atrial fibrillation. She is currently on Coumadin and 0.81 aspirin daily  and she is currently getting her blood checked every 2 weeks. She is ambulating with single-point cane. MRI of cervical spine in the past that showed multilevel degenerative disc disease. She returns for reevaluation UPDATE 5/2/ 2018CM Michele [name], 66year old female returns for follow-up with history of stroke in Oct 2016 MRI Acute-subacute ischemic infarction in the left internal capsule . He is currently on Coumadin and aspirin without further stroke or TIA symptoms. She has chronic pain and sees Dr. RNelva Bush Her physical therapies have concluded that she does not do her home exercise program. She ambulates with single-point cane. No recent falls. She  has multilevel degenerative disc disease in the cervical spine. She is also diabetic but does not always follow for  diabetic diet. She is on Crestor for hyperlipidemia. She was recently placed on oxygen 2-4 L for CHF and COPD. She says she has a history of carpal tunnel in both hands however she does not wear wrist splints. She returns for reevaluation REVIEW OF SYSTEMS: Full 14 system review of systems performed and notable only for those listed, all others are neg:  Constitutional: Fatigue  Cardiovascular: neg Ear/Nose/Throat: neg  Skin: neg Eyes: neg Respiratory: Shortness of breath with exertion, COPD Gastroitestinal: neg  Genitourinary urinary incontinence Hematology/Lymphatic: Easy bruising chronic anemia managed by oncology Endocrine: neg Musculoskeletal:neg Allergy/Immunology: Environmental allergies Neurological: Headache Psychiatric: Depression  Sleep : neg   ALLERGIES: Allergies  Allergen Reactions  . Meprobamate Nausea And Vomiting    HOME MEDICATIONS:  Current Outpatient Prescriptions:  .  ADVAIR DISKUS 250-50 MCG/DOSE AEPB, INHALE ONE PUFF TWICE DAILY, Disp: 60 each, Rfl: 5 .  albuterol (PROVENTIL) (2.5 MG/3ML) 0.083% nebulizer solution, Take 3 mLs (2.5 mg total) by nebulization every 6 (six) hours as needed., Disp: 360 mL, Rfl: 2 .  albuterol (VENTOLIN HFA) 108 (90 Base) MCG/ACT inhaler, Inhale 1 puff into the lungs every 6 (six) hours as needed for wheezing or shortness of breath., Disp: 18 g, Rfl: 4 .  ARIPiprazole (ABILIFY) 5 MG tablet, TAKE ONE (1) TABLET BY MOUTH EVERY DAY, Disp: 90 tablet, Rfl: 0 .  aspirin 81 MG tablet, Take 81 mg by mouth daily.  , Disp: , Rfl:  .  benztropine (COGENTIN) 0.5 MG tablet, Take 1 tablet (0.5 mg total) by mouth at bedtime., Disp: 90 tablet, Rfl: 0 .  diltiazem (CARDIZEM CD) 180 MG 24 hr capsule, TAKE ONE CAPSULE BY MOUTH DAILY, Disp: 90 capsule, Rfl: 0 .  fexofenadine (ALLEGRA) 60 MG tablet, Take 1 tablet (60 mg total) by mouth daily. Take 60 mg by mouth daily., Disp: 30 tablet, Rfl: 11 .  furosemide (LASIX) 40 MG tablet, TAKE ONE (1) TABLET  BY MOUTH EVERY DAY, Disp: 30 tablet, Rfl: 9 .  gabapentin (NEURONTIN) 300 MG capsule, Take 300 mg by mouth at bedtime., Disp: , Rfl: 0 .  HYDROcodone-acetaminophen (NORCO) 10-325 MG per tablet, Take 1 tablet by mouth every 6 (six) hours as needed., Disp: , Rfl: 0 .  irbesartan (AVAPRO) 150 MG tablet, TAKE ONE (1) TABLET BY MOUTH EVERY DAY, Disp: 30 tablet, Rfl: 0 .  isosorbide mononitrate (IMDUR) 30 MG 24 hr tablet, TAKE ONE (1) TABLET BY MOUTH EVERY DAY, Disp: 90 tablet, Rfl: 3 .  metFORMIN (GLUCOPHAGE-XR) 500 MG 24 hr tablet, TAKE ONE (1) TABLET BY MOUTH EVERY DAY WITH BREAKFAST, Disp: 90 tablet, Rfl: 3 .  metoprolol (LOPRESSOR) 100 MG tablet, TAKE 1 TABLET BY MOUTH 2 TIMES DAILY, Disp: 180 tablet, Rfl: 3 .  mometasone (NASONEX) 50 MCG/ACT nasal spray, Place 2 sprays into the nose daily., Disp: 17 g, Rfl: 2 .  nitroGLYCERIN (NITROSTAT) 0.4 MG SL tablet, Place 1 tablet (0.4 mg total) under the tongue every 5 (five) minutes as needed., Disp: 25 tablet, Rfl: 3 .  pantoprazole (PROTONIX) 20 MG tablet, Take 20 mg by mouth daily., Disp: , Rfl:  .  rosuvastatin (CRESTOR) 10 MG tablet, TAKE ONE (1) TABLET BY MOUTH EVERY DAY, Disp: 90 tablet, Rfl: 1 .  sertraline (ZOLOFT) 100 MG tablet, TAKE TWO (2) TABLETS BY MOUTH DAILY, Disp: 180 tablet, Rfl: 0 .  Trospium Chloride 60 MG CP24,  TAKE ONE CAPSULE BY MOUTH DAILY, Disp: 90 each, Rfl: 1 .  UNABLE TO FIND, Med Name: Portable oxygen 4L/ o2 or mask prn, Disp: , Rfl:  .  warfarin (COUMADIN) 7.5 MG tablet, TAKE ONE AND ONE-HALF (1 & 1/2) TO TWO (2) TABLETS BY MOUTH DAILY AS DIRECTED, Disp: 165 tablet, Rfl: 0     PAST MEDICAL HISTORY: Past Medical History:  Diagnosis Date  . Allergic rhinitis   . 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 Asthma   . CAD (coronary artery disease)   . CAD (coronary artery disease) 01/30/2007   stents trivial coronary artery disease diffusely, with a recent deployment od a intracoronary artery stent, 3.5 x 12 mm driver  stent with no more than 20-30% in- stents restenosis.done by Dr [name] with re-look on novenber 10 2008 revealing a widely patent stent with otherwise trival CAD and normal LV function  . Carpal tunnel syndrome, right   . Chest pain 07/17/12009   2 D Echo EF >55%  . Depression   . Diabetes mellitus type II   . Gait instability   . Hyperlipidemia   . Hypertension   . Iron deficiency anemia 07/13/2016  . Obesity   . Paroxysmal A-fib ([place])   . PTSD (post-traumatic stress disorder)   . Sleep apnea     PAST SURGICAL HISTORY: Past Surgical History:  Procedure Laterality Date  . CHOLECYSTECTOMY    . GALLBLADDER SURGERY    . HEMORRHOID SURGERY    . Kidney stones    . Left groin cyst    . multilevel lumbar degenerative disc disease    . stress myocardial perfusion study  09/08/2010   normal; EF 75%    FAMILY HISTORY: Family History  Problem Relation Age of Onset  . Coronary artery disease Mother   . Coronary artery disease Father   . Depression Father     SOCIAL HISTORY: Social History   Social History  . Marital status: Divorced    Spouse name: N/A  . Number of children: 2  . Years of education: 14   Occupational History  . 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 Smoking status: Never Smoker  . Smokeless tobacco: Never Used  . Alcohol use No  . Drug use: No  . Sexual activity: Not Currently   Other Topics Concern  . Not on file   Social History Narrative   Lives at home alone.   Right-handed.   Occasional caffeine use.   Retired [employer] since 2014. (Rehab).       PHYSICAL EXAM  Vitals:   11/17/16 1610  BP: 113/62  Pulse: 64  Height: 5' 2"  (1.575 m)   There is no height or weight on file to calculate BMI.  Generalized: Well developed, Morbidly obese female in no acute distress , well-groomed Head: normocephalic and atraumatic,. Oropharynx benign  Neck: Supple, no carotid bruits  Cardiac: Regular rate  rhythm, no murmur  Musculoskeletal: No deformity   Neurological examination   Mentation: Alert oriented to time, place, history taking. Attention span and concentration appropriate. Recent and remote memory intact.  Follows all commands speech and language fluent.   Cranial nerve II-XII: Pupils were equal round reactive to light extraocular movements were full, visual field were full on confrontational test. Facial sensation and strength were normal. hearing was intact to finger rubbing bilaterally. Uvula tongue midline. head turning and shoulder shrug were  normal and symmetric.Tongue protrusion into cheek strength was normal. Motor: Moderate right and left hip flexion weakness ankle dorsiflexion weakness Sensory: normal and symmetric to light touch, pinprick, and  Vibration, in the upper and lower extremities Coordination: finger-nose-finger, heel-to-shin bilaterally, no dysmetria Reflexes: Symmetric upper and lower, plantar responses were flexor bilaterally. Gait and Station: Rising up from seated position without assistance, wide based stance,  cautious unsteady gait with single-point cane   DIAGNOSTIC DATA (LABS, IMAGING, TESTING) - I reviewed patient records, labs, notes, testing and imaging myself where available.      Component Value Date/Time   NA 139 07/31/2015 1305   NA 144 10/02/2012 0905   K 3.9 07/31/2015 1305   K 3.4 (L) 10/02/2012 0905   CL 97 07/31/2015 1305   CL 101 10/02/2012 0905   CO2 32 07/31/2015 1305   CO2 31 (H) 10/02/2012 0905   GLUCOSE 84 07/31/2015 1305   GLUCOSE 107 (H) 10/02/2012 0905   BUN 21 07/31/2015 1305   BUN 13.9 10/02/2012 0905   CREATININE 0.83 07/31/2015 1305   CREATININE 0.77 12/08/2014 1153   CREATININE 0.8 10/02/2012 0905   CALCIUM 10.2 07/31/2015 1305   CALCIUM 9.9 10/02/2012 0905   PROT 7.6 11/25/2015 0841   ALBUMIN 4.3 11/25/2015 0841   AST 16 11/25/2015 0841   ALT 19 11/25/2015 0841   ALKPHOS 93 11/25/2015 0841   BILITOT 0.5  11/25/2015 0841   GFRNONAA >89 01/08/2014 1204   GFRAA >89 01/08/2014 1204   Lab Results  Component Value Date   CHOL 150 11/25/2015   HDL 55 11/25/2015   LDLCALC 69 11/25/2015   TRIG 130 11/25/2015   CHOLHDL 2.7 11/25/2015   Lab Results  Component Value Date   HGBA1C 6.2 11/06/2015     ASSESSMENT AND PLAN  66 y.o. year old female  has a past medical history of  CAD (coronary artery disease);  Depression; Diabetes mellitus type II; Gait instability; Hyperlipidemia; Hypertension; Obesity; Paroxysmal a-fib ([place]);  and Sleep apnea, small vessel  stroke in gait disorder  which is multifactorial due to her obesity bilateral knee pain hip flexion weakness and previous stroke.   PLAN: Continued Management of stroke risk factors Continue Coumadin for secondary stroke prevention and history of paroxysmal atrial fibrillation Continue Crestor for hyperlipedemia labs by PCP last LDL 69 11/2015 Continue   home exercise program Blood pressure in the office today 113/72, make sure you stay well hydrated Risk for falls due to your gait abnormality use cane at all times Continue follow up with Dr. Nelva Bush for pain management Continue Oxygen 2-4 L nasal cannula Continue to follow up with Dr. Tye Savoy primary care stroke risk management Continue seeing counselor and Dr. Adele Schilder Discharge from neurologic clinic  A total of 25 min  minutes was spent face-to-face with this patient. Over half this time was spent on counseling patient on the stroke risk management reviewed patient handout. Be compliant with current medication regimen Dennie Bible, [facility], [facility], APRN  [name] [address], [place], [place] [address] [phone]

## 2016-11-22 NOTE — Progress Notes (Signed)
I have reviewed and agreed above plan. 

## 2016-11-25 ENCOUNTER — Ambulatory Visit (INDEPENDENT_AMBULATORY_CARE_PROVIDER_SITE_OTHER): Payer: No Typology Code available for payment source | Admitting: Psychology

## 2016-11-25 DIAGNOSIS — F331 Major depressive disorder, recurrent, moderate: Secondary | ICD-10-CM

## 2016-11-25 NOTE — Progress Notes (Signed)
   THERAPIST PROGRESS NOTE  Session Time: 1.30pm-2.20pm  Participation Level: Active  Behavioral Response: Well GroomedAlertStressed  Type of Therapy: Individual Therapy  Treatment Goals addressed: Diagnosis: MDD and goal 1.  Interventions: CBT, Solution Focused and Supportive  Summary: Michele [name] is a 66 y.o. female who presents with report of feeling very stressed recently w/ trying to keep up w/ appointments and remember things. Pt reported missed last appointment after incontenience and having to change 2 times. Pt reported that her IBS is also acting up w/ stress.  Pt thinking was more scattered.  Pt was concerned about letters received from old providers thinking she had "messed up" and done something wrong.  Pt was able to increase awareness that just system generator letters for reminders and need to notify providers under different care.  Pt acknowledge that participating in negative self talk and quick to blame self and think other thinking poorly about her. Pt was able to reframe w/ counselor assistance.  Pt receptive to use of one calendar for appointment and to do lists.    Suicidal/Homicidal: Nowithout intent/plan  Therapist Response: Assessed pt current functioning per pt report.  Processed w/pt her report of stressors.  Assisted pt in awareness of distortions and reframing- self blame and negative self worth.  Discussed w/ pt w/ increased struggles w/ memory and feeling scattered may be of benefit to use on calendar for all appointments and things to do- instead of multiple papers/cards w/ appointments on them.   Plan: Return again in 2 weeks.  Diagnosis: MDD, recurrent, moderate   Scotti Kosta, LPC 11/25/2016

## 2016-11-30 ENCOUNTER — Ambulatory Visit: Payer: Medicare Other | Admitting: Cardiovascular Disease

## 2016-12-02 ENCOUNTER — Other Ambulatory Visit: Payer: Self-pay | Admitting: Cardiovascular Disease

## 2016-12-09 ENCOUNTER — Ambulatory Visit (INDEPENDENT_AMBULATORY_CARE_PROVIDER_SITE_OTHER): Payer: No Typology Code available for payment source | Admitting: Psychology

## 2016-12-09 DIAGNOSIS — F331 Major depressive disorder, recurrent, moderate: Secondary | ICD-10-CM

## 2016-12-09 NOTE — Progress Notes (Signed)
   THERAPIST PROGRESS NOTE  Session Time: 12.35pm-1.25pm  Participation Level: Active  Behavioral Response: Fairly GroomedAlertDepressed  Type of Therapy: Individual Therapy  Treatment Goals addressed: Diagnosis: MDd and goal 1.  Interventions: CBT and Supportive  Summary: Michele [name] is a 66 y.o. female who presents with affect brighter than last session. Pt had some difficulty w/ breath when first entered session using her inhaler.  Pt reported w/ walking difficulty breathing and will not take her oxygen from home as seems too much to manage.  Pt reported that still struggles w/ depressed mood- but that feels better than did last session. Pt did discuss positive visits w/ her sister and extended family for family graduation/birthday party celebration.  Pt discussed her connection and interactions w/ grandchildren.  Pt continues to struggle w/ recall of names- words. Pt was able to discuss her want to be independent as long as can- living on her own.  Thankful for daughter who is planning for creating space for her to eventually move in. Pt discussed worry that would "be in the way" there and is able to increase awareness that this is more just an anxious thought- distortion- than reality.     Suicidal/Homicidal: Nowithout intent/plan  Therapist Response: Assessed pt current functioning per pt report.t  Processed w/pt coping w/ depressed mood and benefit of family interactions.  explored w/pt ways keeps connected and recognizes gratitude.  Processed w/pt her worried thoughts re: losing independence and assisting reframes.   Plan: Return again in 2 weeks.  Diagnosis: MDD   [name], [facility] 12/09/2016

## 2016-12-11 DIAGNOSIS — J449 Chronic obstructive pulmonary disease, unspecified: Secondary | ICD-10-CM | POA: Diagnosis not present

## 2016-12-22 ENCOUNTER — Other Ambulatory Visit: Payer: Self-pay | Admitting: Physician Assistant

## 2016-12-23 ENCOUNTER — Ambulatory Visit (HOSPITAL_COMMUNITY): Payer: Self-pay | Admitting: Psychology

## 2016-12-30 ENCOUNTER — Encounter (HOSPITAL_COMMUNITY): Payer: Self-pay | Admitting: Psychiatry

## 2016-12-30 ENCOUNTER — Ambulatory Visit (INDEPENDENT_AMBULATORY_CARE_PROVIDER_SITE_OTHER): Payer: No Typology Code available for payment source | Admitting: Psychiatry

## 2016-12-30 DIAGNOSIS — E119 Type 2 diabetes mellitus without complications: Secondary | ICD-10-CM

## 2016-12-30 DIAGNOSIS — Z818 Family history of other mental and behavioral disorders: Secondary | ICD-10-CM | POA: Diagnosis not present

## 2016-12-30 DIAGNOSIS — I480 Paroxysmal atrial fibrillation: Secondary | ICD-10-CM

## 2016-12-30 DIAGNOSIS — F331 Major depressive disorder, recurrent, moderate: Secondary | ICD-10-CM

## 2016-12-30 MED ORDER — ARIPIPRAZOLE 5 MG PO TABS: ORAL_TABLET | ORAL | 0 refills | Status: DC

## 2016-12-30 MED ORDER — SERTRALINE HCL 100 MG PO TABS: ORAL_TABLET | ORAL | 0 refills | Status: DC

## 2016-12-30 MED ORDER — BENZTROPINE MESYLATE 0.5 MG PO TABS: 0.5000 mg | ORAL_TABLET | Freq: Every day | ORAL | 0 refills | Status: DC
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
## 2017-01-05 ENCOUNTER — Other Ambulatory Visit: Payer: Self-pay | Admitting: Physician Assistant

## 2017-01-06 ENCOUNTER — Ambulatory Visit (INDEPENDENT_AMBULATORY_CARE_PROVIDER_SITE_OTHER): Payer: No Typology Code available for payment source | Admitting: Psychology

## 2017-01-06 DIAGNOSIS — F331 Major depressive disorder, recurrent, moderate: Secondary | ICD-10-CM

## 2017-01-06 NOTE — Progress Notes (Signed)
   THERAPIST PROGRESS NOTE  Session Time: 12.35-1.25pm  Participation Level: Active  Behavioral Response: Well GroomedAlertDepressed  Type of Therapy: Individual Therapy  Treatment Goals addressed: Diagnosis: MDd and goal 1.  Interventions: CBT and Supportive  Summary: Michele [name] is a 66 y.o. female who presents with report of some depressed mood- but also focus on gratitude and reframing.  Pt reported that her daughter had her move in w/ her this past week.  Pt expressed gratitude and positive interactions but also sense of loss. Pt did express that big loss was daughter getting rid of some things that she didn't feel she needed but didn't check w/ pt about. Pt reported she did express this to her.  Pt discussed that still able to drive and find way around and has been able to visit sisters.  Pt aware feeling are valid and take time for transition.    Suicidal/Homicidal: Nowithout intent/plan  Therapist Response: Assessed pt current functioning per pt report. Processed w/pt feeling re: transition to living w/ daughter.  Encouraged pt to assert her feelings and thoughts.  Validated and normalized feelings- discussed transition.   Plan: Return again in 2 weeks.  Diagnosis: MDD    [name], [facility] 01/06/2017

## 2017-01-11 ENCOUNTER — Other Ambulatory Visit: Payer: Self-pay | Admitting: Family Medicine

## 2017-01-11 ENCOUNTER — Other Ambulatory Visit: Payer: Self-pay | Admitting: Physician Assistant

## 2017-01-11 DIAGNOSIS — J449 Chronic obstructive pulmonary disease, unspecified: Secondary | ICD-10-CM | POA: Diagnosis not present

## 2017-01-20 ENCOUNTER — Other Ambulatory Visit: Payer: Self-pay | Admitting: Cardiovascular Disease

## 2017-01-26 DIAGNOSIS — I10 Essential (primary) hypertension: Secondary | ICD-10-CM | POA: Diagnosis not present

## 2017-01-26 DIAGNOSIS — E785 Hyperlipidemia, unspecified: Secondary | ICD-10-CM | POA: Diagnosis not present

## 2017-01-26 DIAGNOSIS — R413 Other amnesia: Secondary | ICD-10-CM | POA: Diagnosis not present

## 2017-01-26 DIAGNOSIS — R32 Unspecified urinary incontinence: Secondary | ICD-10-CM | POA: Diagnosis not present

## 2017-01-26 DIAGNOSIS — Z1231 Encounter for screening mammogram for malignant neoplasm of breast: Secondary | ICD-10-CM | POA: Diagnosis not present

## 2017-01-26 DIAGNOSIS — R296 Repeated falls: Secondary | ICD-10-CM | POA: Diagnosis not present

## 2017-01-26 DIAGNOSIS — Z0000 Encounter for general adult medical examination without abnormal findings: Secondary | ICD-10-CM | POA: Diagnosis not present

## 2017-02-01 ENCOUNTER — Other Ambulatory Visit: Payer: Self-pay | Admitting: Cardiovascular Disease

## 2017-02-01 NOTE — Telephone Encounter (Signed)
Rx has been sent to the pharmacy electronically. ° °

## 2017-02-14 ENCOUNTER — Ambulatory Visit (INDEPENDENT_AMBULATORY_CARE_PROVIDER_SITE_OTHER): Payer: No Typology Code available for payment source | Admitting: Psychology

## 2017-02-14 DIAGNOSIS — F331 Major depressive disorder, recurrent, moderate: Secondary | ICD-10-CM

## 2017-02-14 NOTE — Progress Notes (Signed)
   THERAPIST PROGRESS NOTE  Session Time: 2.30PM-3.20PM  Participation Level: Active  Behavioral Response: Well GroomedAlertAFFECT WNL  Type of Therapy: Individual Therapy  Treatment Goals addressed: Diagnosis: MDd an dgoal 1.  Interventions: CBT and Supportive  Summary: Michele [name] is a 66 y.o. female who presents with affect wnl.  Pt had dozed off in lobby waiting for appointment.  Pt is winded walking to office w/ cane.  Pt reports carrying her oxygen tank for visits is too cumbersome.  Pt reported that she is adjusting to living w/ daughter. Pt reports good support from daughter and grandkids.  Pt reports missing brother/sister and wants to visit [place] - hoping to for class reunion in November. Pt continues to report bothered by poor memory.  Pt did struggle w/ recall for words in session at times.  Pt reports some depressed moods related to health concerns and lossess. .   Suicidal/Homicidal: Nowithout intent/plan  Therapist Response: Assessed pt current functioning per pt report.t  processed w/ pt stressors w/ medical and transitions.  Assisted pt in reframes .  Encouraged continued practices w/ movement and novel things for memory.  discussed how to utilize supports for assisting w/ struggles- ie having grandson set alarm to call to schedule for cardiologist.     Plan: Return again in 2 weeks.  Diagnosis: MDD    [name], [facility] 02/14/2017

## 2017-02-16 ENCOUNTER — Telehealth: Payer: Self-pay | Admitting: Pharmacist Clinician (PhC)/ Clinical Pharmacy Specialist

## 2017-02-16 NOTE — Telephone Encounter (Signed)
LMOM to schedule INR

## 2017-02-17 NOTE — Telephone Encounter (Signed)
appt scheduled Mon Aug 6

## 2017-02-21 ENCOUNTER — Ambulatory Visit (INDEPENDENT_AMBULATORY_CARE_PROVIDER_SITE_OTHER): Payer: Medicare Other | Admitting: Pharmacist Clinician (PhC)/ Clinical Pharmacy Specialist

## 2017-02-21 DIAGNOSIS — I480 Paroxysmal atrial fibrillation: Secondary | ICD-10-CM

## 2017-02-21 DIAGNOSIS — Z7901 Long term (current) use of anticoagulants: Secondary | ICD-10-CM | POA: Diagnosis not present

## 2017-02-21 LAB — POCT INR: INR: 1.7

## 2017-03-07 ENCOUNTER — Ambulatory Visit (INDEPENDENT_AMBULATORY_CARE_PROVIDER_SITE_OTHER): Payer: Medicare Other | Admitting: Pharmacist Clinician (PhC)/ Clinical Pharmacy Specialist

## 2017-03-07 DIAGNOSIS — I480 Paroxysmal atrial fibrillation: Secondary | ICD-10-CM

## 2017-03-07 DIAGNOSIS — Z7901 Long term (current) use of anticoagulants: Secondary | ICD-10-CM | POA: Diagnosis not present

## 2017-03-07 LAB — POCT INR
INR: 5.1
INR: 5.1

## 2017-03-09 ENCOUNTER — Ambulatory Visit (INDEPENDENT_AMBULATORY_CARE_PROVIDER_SITE_OTHER): Payer: No Typology Code available for payment source | Admitting: Psychology

## 2017-03-09 DIAGNOSIS — F331 Major depressive disorder, recurrent, moderate: Secondary | ICD-10-CM

## 2017-03-09 NOTE — Progress Notes (Signed)
   THERAPIST PROGRESS NOTE  Session Time: 2.25pm-3.20pm  Participation Level: Active  Behavioral Response: Well GroomedAlertderpessed  Type of Therapy: Individual Therapy  Treatment Goals addressed: Diagnosis: MDD and goal 1.  Interventions: CBT and Supportive  Summary: Michele [name] is a 66 y.o. female who presents with affect wnl.  Pt reports that she has been down over past couple weeks. Pt reports missing visiting w/ family and her granddaughter in [place].  Pt reports also aging process and wanted to be able to go and do like she used to but not being able physically to do so.  Pt reported having a fall Monday when stepping onto the curb.  Pt reports her daughter stayed w/ her past day.  Pt is able to identify positives of support and enjoying conversations.  Pt also aware that struggling w/ memory and becomes frustrated w/ this.   Suicidal/Homicidal: Nowithout intent/plan  Therapist Response: Assessed pt current functioning per pt report. Processed w/pt contributing factors of depressed mood.  Coping through acceptance and how to accommodate through aging process.   Plan: Return again in 2 weeks.  Diagnosis: MDD    [name], [facility] 03/09/2017

## 2017-03-19 DIAGNOSIS — N179 Acute kidney failure, unspecified: Secondary | ICD-10-CM

## 2017-03-19 HISTORY — DX: Acute kidney failure, unspecified: N17.9

## 2017-03-22 ENCOUNTER — Ambulatory Visit (INDEPENDENT_AMBULATORY_CARE_PROVIDER_SITE_OTHER): Payer: Medicare Other | Admitting: Pharmacist Clinician (PhC)/ Clinical Pharmacy Specialist

## 2017-03-22 DIAGNOSIS — I480 Paroxysmal atrial fibrillation: Secondary | ICD-10-CM | POA: Diagnosis not present

## 2017-03-22 DIAGNOSIS — Z7901 Long term (current) use of anticoagulants: Secondary | ICD-10-CM

## 2017-03-22 LAB — POCT INR: INR: 1

## 2017-03-24 ENCOUNTER — Other Ambulatory Visit: Payer: Self-pay | Admitting: Cardiovascular Disease

## 2017-03-24 ENCOUNTER — Other Ambulatory Visit: Payer: Self-pay | Admitting: Emergency Medicine

## 2017-03-24 ENCOUNTER — Ambulatory Visit (INDEPENDENT_AMBULATORY_CARE_PROVIDER_SITE_OTHER): Payer: No Typology Code available for payment source | Admitting: Psychology

## 2017-03-24 DIAGNOSIS — F330 Major depressive disorder, recurrent, mild: Secondary | ICD-10-CM

## 2017-03-24 NOTE — Progress Notes (Signed)
   THERAPIST PROGRESS NOTE  Session Time: 2.32pm-3.20pm  Participation Level: Active  Behavioral Response: Well GroomedAlertaffect wnl  Type of Therapy: Individual Therapy  Treatment Goals addressed: Diagnosis: MDD and gaol 1.  Interventions: Strength-based and Supportive  Summary: Michele [name] is a 67 y.o. female who presents with affect wnl.  Thought process slow- but clear and coherent.  Pt does report tired today.  Pt reports that she has a lot to be thankful for and discussed care of her daughter, sister's and feeling positive about what she has given over the years to her children and grandchildren.  Pt discussed that she hasn't had any other falls- has worries about.  Pt has a "helper" that daughter set up to come mid day 3 days a week.  Pt discussed that she is enjoying visits w/ sisters when needs to get out of the house.   Suicidal/Homicidal: Nowithout intent/plan  Therapist Response: Assessed pt current functioning per pt report.  Processed w/pt coping with stressors of medical and aging.  Explored w/pt positives and connections that meaningful.    Plan: Return again in 2 weeks.  Diagnosis: MDD, mild    Alda Gaultney, LPC 03/24/2017

## 2017-03-24 NOTE — Telephone Encounter (Signed)
REFILL 

## 2017-03-31 ENCOUNTER — Ambulatory Visit (HOSPITAL_COMMUNITY): Payer: No Typology Code available for payment source | Admitting: Psychiatry

## 2017-04-06 DIAGNOSIS — M5136 Other intervertebral disc degeneration, lumbar region: Secondary | ICD-10-CM | POA: Diagnosis not present

## 2017-04-06 DIAGNOSIS — G894 Chronic pain syndrome: Secondary | ICD-10-CM | POA: Diagnosis not present

## 2017-04-06 DIAGNOSIS — B029 Zoster without complications: Secondary | ICD-10-CM | POA: Diagnosis not present

## 2017-04-08 ENCOUNTER — Ambulatory Visit (INDEPENDENT_AMBULATORY_CARE_PROVIDER_SITE_OTHER): Payer: Medicare Other | Admitting: Pharmacist

## 2017-04-08 DIAGNOSIS — Z7901 Long term (current) use of anticoagulants: Secondary | ICD-10-CM | POA: Diagnosis not present

## 2017-04-08 DIAGNOSIS — I480 Paroxysmal atrial fibrillation: Secondary | ICD-10-CM | POA: Diagnosis not present

## 2017-04-08 LAB — POCT INR: INR: 1.7

## 2017-04-11 ENCOUNTER — Emergency Department (HOSPITAL_COMMUNITY): Payer: Medicare Other

## 2017-04-11 ENCOUNTER — Encounter (HOSPITAL_COMMUNITY): Payer: Self-pay | Admitting: Internal Medicine

## 2017-04-11 ENCOUNTER — Inpatient Hospital Stay (HOSPITAL_COMMUNITY)
Admission: EM | Admit: 2017-04-11 | Discharge: 2017-04-21 | DRG: 682 | Disposition: A | Discharge status: Home / Self Care | Payer: Medicare Other | Attending: Internal Medicine | Admitting: Internal Medicine

## 2017-04-11 DIAGNOSIS — J9611 Chronic respiratory failure with hypoxia: Secondary | ICD-10-CM | POA: Diagnosis present

## 2017-04-11 DIAGNOSIS — W19XXXA Unspecified fall, initial encounter: Secondary | ICD-10-CM | POA: Diagnosis not present

## 2017-04-11 DIAGNOSIS — B029 Zoster without complications: Secondary | ICD-10-CM | POA: Diagnosis present

## 2017-04-11 DIAGNOSIS — R9431 Abnormal electrocardiogram [ECG] [EKG]: Secondary | ICD-10-CM | POA: Diagnosis not present

## 2017-04-11 DIAGNOSIS — Z9109 Other allergy status, other than to drugs and biological substances: Secondary | ICD-10-CM | POA: Diagnosis not present

## 2017-04-11 DIAGNOSIS — I480 Paroxysmal atrial fibrillation: Secondary | ICD-10-CM | POA: Diagnosis present

## 2017-04-11 DIAGNOSIS — I2510 Atherosclerotic heart disease of native coronary artery without angina pectoris: Secondary | ICD-10-CM | POA: Diagnosis not present

## 2017-04-11 DIAGNOSIS — E7849 Other hyperlipidemia: Secondary | ICD-10-CM | POA: Diagnosis not present

## 2017-04-11 DIAGNOSIS — I10 Essential (primary) hypertension: Secondary | ICD-10-CM | POA: Diagnosis present

## 2017-04-11 DIAGNOSIS — R4182 Altered mental status, unspecified: Secondary | ICD-10-CM | POA: Diagnosis not present

## 2017-04-11 DIAGNOSIS — Z9861 Coronary angioplasty status: Secondary | ICD-10-CM

## 2017-04-11 DIAGNOSIS — R0789 Other chest pain: Secondary | ICD-10-CM | POA: Diagnosis not present

## 2017-04-11 DIAGNOSIS — Z7984 Long term (current) use of oral hypoglycemic drugs: Secondary | ICD-10-CM

## 2017-04-11 DIAGNOSIS — Z794 Long term (current) use of insulin: Secondary | ICD-10-CM | POA: Diagnosis not present

## 2017-04-11 DIAGNOSIS — E785 Hyperlipidemia, unspecified: Secondary | ICD-10-CM | POA: Diagnosis present

## 2017-04-11 DIAGNOSIS — G20 Parkinson's disease: Secondary | ICD-10-CM | POA: Diagnosis not present

## 2017-04-11 DIAGNOSIS — D509 Iron deficiency anemia, unspecified: Secondary | ICD-10-CM | POA: Diagnosis present

## 2017-04-11 DIAGNOSIS — Z79899 Other long term (current) drug therapy: Secondary | ICD-10-CM | POA: Diagnosis not present

## 2017-04-11 DIAGNOSIS — G4733 Obstructive sleep apnea (adult) (pediatric): Secondary | ICD-10-CM | POA: Diagnosis not present

## 2017-04-11 DIAGNOSIS — F29 Unspecified psychosis not due to a substance or known physiological condition: Secondary | ICD-10-CM | POA: Diagnosis not present

## 2017-04-11 DIAGNOSIS — Z9981 Dependence on supplemental oxygen: Secondary | ICD-10-CM | POA: Diagnosis not present

## 2017-04-11 DIAGNOSIS — Z955 Presence of coronary angioplasty implant and graft: Secondary | ICD-10-CM | POA: Diagnosis not present

## 2017-04-11 DIAGNOSIS — G9341 Metabolic encephalopathy: Secondary | ICD-10-CM | POA: Diagnosis present

## 2017-04-11 DIAGNOSIS — Z6841 Body Mass Index (BMI) 40.0 and over, adult: Secondary | ICD-10-CM | POA: Diagnosis not present

## 2017-04-11 DIAGNOSIS — I509 Heart failure, unspecified: Secondary | ICD-10-CM | POA: Diagnosis not present

## 2017-04-11 DIAGNOSIS — Z7901 Long term (current) use of anticoagulants: Secondary | ICD-10-CM | POA: Diagnosis not present

## 2017-04-11 DIAGNOSIS — J4520 Mild intermittent asthma, uncomplicated: Secondary | ICD-10-CM | POA: Diagnosis present

## 2017-04-11 DIAGNOSIS — M6282 Rhabdomyolysis: Secondary | ICD-10-CM

## 2017-04-11 DIAGNOSIS — R2689 Other abnormalities of gait and mobility: Secondary | ICD-10-CM | POA: Diagnosis not present

## 2017-04-11 DIAGNOSIS — B999 Unspecified infectious disease: Secondary | ICD-10-CM | POA: Diagnosis not present

## 2017-04-11 DIAGNOSIS — Z8249 Family history of ischemic heart disease and other diseases of the circulatory system: Secondary | ICD-10-CM

## 2017-04-11 DIAGNOSIS — R4020 Unspecified coma: Secondary | ICD-10-CM | POA: Diagnosis not present

## 2017-04-11 DIAGNOSIS — F339 Major depressive disorder, recurrent, unspecified: Secondary | ICD-10-CM | POA: Diagnosis not present

## 2017-04-11 DIAGNOSIS — B020 Zoster encephalitis: Secondary | ICD-10-CM | POA: Diagnosis not present

## 2017-04-11 DIAGNOSIS — F329 Major depressive disorder, single episode, unspecified: Secondary | ICD-10-CM | POA: Diagnosis present

## 2017-04-11 DIAGNOSIS — G0490 Encephalitis and encephalomyelitis, unspecified: Secondary | ICD-10-CM | POA: Diagnosis not present

## 2017-04-11 DIAGNOSIS — S3993XA Unspecified injury of pelvis, initial encounter: Secondary | ICD-10-CM | POA: Diagnosis not present

## 2017-04-11 DIAGNOSIS — N179 Acute kidney failure, unspecified: Principal | ICD-10-CM

## 2017-04-11 DIAGNOSIS — M6281 Muscle weakness (generalized): Secondary | ICD-10-CM | POA: Diagnosis not present

## 2017-04-11 DIAGNOSIS — A86 Unspecified viral encephalitis: Secondary | ICD-10-CM | POA: Diagnosis present

## 2017-04-11 DIAGNOSIS — F0390 Unspecified dementia without behavioral disturbance: Secondary | ICD-10-CM | POA: Diagnosis present

## 2017-04-11 DIAGNOSIS — W1830XA Fall on same level, unspecified, initial encounter: Secondary | ICD-10-CM | POA: Diagnosis present

## 2017-04-11 DIAGNOSIS — N171 Acute kidney failure with acute cortical necrosis: Secondary | ICD-10-CM | POA: Diagnosis not present

## 2017-04-11 DIAGNOSIS — E1140 Type 2 diabetes mellitus with diabetic neuropathy, unspecified: Secondary | ICD-10-CM | POA: Diagnosis not present

## 2017-04-11 DIAGNOSIS — E875 Hyperkalemia: Secondary | ICD-10-CM | POA: Diagnosis not present

## 2017-04-11 DIAGNOSIS — E860 Dehydration: Secondary | ICD-10-CM | POA: Diagnosis present

## 2017-04-11 DIAGNOSIS — Z7982 Long term (current) use of aspirin: Secondary | ICD-10-CM

## 2017-04-11 DIAGNOSIS — E119 Type 2 diabetes mellitus without complications: Secondary | ICD-10-CM

## 2017-04-11 DIAGNOSIS — E11620 Type 2 diabetes mellitus with diabetic dermatitis: Secondary | ICD-10-CM | POA: Diagnosis not present

## 2017-04-11 HISTORY — DX: Acute kidney failure, unspecified: N17.9

## 2017-04-11 LAB — I-STAT CHEM 8, ED
BUN: 50 mg/dL — ABNORMAL HIGH (ref 6–20)
CALCIUM ION: 1 mmol/L — AB (ref 1.15–1.40)
Chloride: 98 mmol/L — ABNORMAL LOW (ref 101–111)
Creatinine, Ser: 2.6 mg/dL — ABNORMAL HIGH (ref 0.44–1.00)
GLUCOSE: 104 mg/dL — AB (ref 65–99)
HCT: 38 % (ref 36.0–46.0)
Hemoglobin: 12.9 g/dL (ref 12.0–15.0)
Potassium: 5.4 mmol/L — ABNORMAL HIGH (ref 3.5–5.1)
SODIUM: 134 mmol/L — AB (ref 135–145)
TCO2: 29 mmol/L (ref 22–32)

## 2017-04-11 LAB — COMPREHENSIVE METABOLIC PANEL
ALT: 25 U/L (ref 14–54)
AST: 51 U/L — AB (ref 15–41)
Albumin: 4.1 g/dL (ref 3.5–5.0)
Alkaline Phosphatase: 104 U/L (ref 38–126)
Anion gap: 12 (ref 5–15)
BUN: 50 mg/dL — AB (ref 6–20)
CALCIUM: 9.8 mg/dL (ref 8.9–10.3)
CO2: 27 mmol/L (ref 22–32)
CREATININE: 2.41 mg/dL — AB (ref 0.44–1.00)
Chloride: 96 mmol/L — ABNORMAL LOW (ref 101–111)
GFR calc non Af Amer: 20 mL/min — ABNORMAL LOW (ref >60)
GFR, EST AFRICAN AMERICAN: 23 mL/min — AB (ref >60)
Glucose, Bld: 98 mg/dL (ref 65–99)
Potassium: 5.2 mmol/L — ABNORMAL HIGH (ref 3.5–5.1)
SODIUM: 135 mmol/L (ref 135–145)
Total Bilirubin: 0.3 mg/dL (ref 0.3–1.2)
Total Protein: 7.9 g/dL (ref 6.5–8.1)

## 2017-04-11 LAB — DIFFERENTIAL
Basophils Absolute: 0 10*3/uL (ref 0.0–0.1)
Basophils Relative: 0 %
Eosinophils Absolute: 0.3 10*3/uL (ref 0.0–0.7)
Eosinophils Relative: 4 %
LYMPHS PCT: 26 %
Lymphs Abs: 2.3 10*3/uL (ref 0.7–4.0)
Monocytes Absolute: 0.6 10*3/uL (ref 0.1–1.0)
Monocytes Relative: 7 %
NEUTROS ABS: 5.5 10*3/uL (ref 1.7–7.7)
NEUTROS PCT: 63 %

## 2017-04-11 LAB — I-STAT VENOUS BLOOD GAS, ED
Acid-Base Excess: 2 mmol/L (ref 0.0–2.0)
Bicarbonate: 29.5 mmol/L — ABNORMAL HIGH (ref 20.0–28.0)
O2 Saturation: 38 %
PCO2 VEN: 61.6 mmHg — AB (ref 44.0–60.0)
PH VEN: 7.288 (ref 7.250–7.430)
PO2 VEN: 25 mmHg — AB (ref 32.0–45.0)
TCO2: 31 mmol/L (ref 22–32)

## 2017-04-11 LAB — PROTIME-INR
INR: 3.45
Prothrombin Time: 34.5 seconds — ABNORMAL HIGH (ref 11.4–15.2)

## 2017-04-11 LAB — I-STAT TROPONIN, ED: Troponin i, poc: 0.01 ng/mL (ref 0.00–0.08)

## 2017-04-11 LAB — URINALYSIS, ROUTINE W REFLEX MICROSCOPIC
BILIRUBIN URINE: NEGATIVE
Glucose, UA: NEGATIVE mg/dL
Hgb urine dipstick: NEGATIVE
KETONES UR: NEGATIVE mg/dL
Leukocytes, UA: NEGATIVE
Nitrite: NEGATIVE
Protein, ur: NEGATIVE mg/dL
SPECIFIC GRAVITY, URINE: 1.016 (ref 1.005–1.030)
pH: 5 (ref 5.0–8.0)

## 2017-04-11 LAB — CBG MONITORING, ED: Glucose-Capillary: 85 mg/dL (ref 65–99)

## 2017-04-11 LAB — APTT: aPTT: 46 seconds — ABNORMAL HIGH (ref 24–36)

## 2017-04-11 LAB — CBC
HCT: 34.9 % — ABNORMAL LOW (ref 36.0–46.0)
Hemoglobin: 10.4 g/dL — ABNORMAL LOW (ref 12.0–15.0)
MCH: 20.8 pg — AB (ref 26.0–34.0)
MCHC: 29.8 g/dL — ABNORMAL LOW (ref 30.0–36.0)
MCV: 69.7 fL — ABNORMAL LOW (ref 78.0–100.0)
Platelets: 311 10*3/uL (ref 150–400)
RBC: 5.01 MIL/uL (ref 3.87–5.11)
RDW: 17 % — ABNORMAL HIGH (ref 11.5–15.5)
WBC: 8.7 10*3/uL (ref 4.0–10.5)

## 2017-04-11 LAB — SODIUM, URINE, RANDOM: SODIUM UR: 28 mmol/L

## 2017-04-11 LAB — LACTIC ACID, PLASMA: Lactic Acid, Venous: 1.9 mmol/L (ref 0.5–1.9)

## 2017-04-11 LAB — CK: CK TOTAL: 1803 U/L — AB (ref 38–234)

## 2017-04-11 LAB — CREATININE, URINE, RANDOM: CREATININE, URINE: 212.27 mg/dL

## 2017-04-11 LAB — TROPONIN I: Troponin I: <0.03 ng/mL (ref <0.03)

## 2017-04-11 MED ORDER — ONDANSETRON HCL 4 MG/2ML IJ SOLN
4.0000 mg | Freq: Four times a day (QID) | INTRAMUSCULAR | Status: DC | PRN
  Administered 2017-04-14 – 2017-04-15 (×2): 4 mg via INTRAVENOUS
  Filled 2017-04-11 (×3): qty 2

## 2017-04-11 MED ORDER — ONDANSETRON HCL 4 MG PO TABS
4.0000 mg | ORAL_TABLET | Freq: Four times a day (QID) | ORAL | Status: DC | PRN
  Administered 2017-04-15: 4 mg via ORAL
  Filled 2017-04-11: qty 1

## 2017-04-11 MED ORDER — HYDRALAZINE HCL 20 MG/ML IJ SOLN
10.0000 mg | INTRAMUSCULAR | Status: DC | PRN
  Administered 2017-04-14 – 2017-04-17 (×3): 10 mg via INTRAVENOUS
  Filled 2017-04-11 (×3): qty 1

## 2017-04-11 MED ORDER — SERTRALINE HCL 100 MG PO TABS
200.0000 mg | ORAL_TABLET | Freq: Every day | ORAL | Status: DC
  Administered 2017-04-12 – 2017-04-21 (×10): 200 mg via ORAL
  Filled 2017-04-11 (×10): qty 2

## 2017-04-11 MED ORDER — MOMETASONE FURO-FORMOTEROL FUM 200-5 MCG/ACT IN AERO
2.0000 | INHALATION_SPRAY | Freq: Two times a day (BID) | RESPIRATORY_TRACT | Status: DC
  Administered 2017-04-12 – 2017-04-20 (×18): 2 via RESPIRATORY_TRACT
  Filled 2017-04-11 (×2): qty 8.8

## 2017-04-11 MED ORDER — VALACYCLOVIR HCL 500 MG PO TABS
1000.0000 mg | ORAL_TABLET | Freq: Every day | ORAL | Status: DC
  Administered 2017-04-12 – 2017-04-13 (×3): 1000 mg via ORAL
  Filled 2017-04-11 (×4): qty 2

## 2017-04-11 MED ORDER — SODIUM CHLORIDE 0.9 % IV SOLN
INTRAVENOUS | Status: AC
  Administered 2017-04-12 (×2): via INTRAVENOUS

## 2017-04-11 MED ORDER — SODIUM CHLORIDE 0.9 % IV SOLN
Freq: Once | INTRAVENOUS | Status: AC
  Administered 2017-04-11: 19:00:00 via INTRAVENOUS

## 2017-04-11 MED ORDER — ALBUTEROL SULFATE (2.5 MG/3ML) 0.083% IN NEBU
3.0000 mL | INHALATION_SOLUTION | Freq: Four times a day (QID) | RESPIRATORY_TRACT | Status: DC | PRN
Start: 2017-04-11 — End: 2017-04-21

## 2017-04-11 MED ORDER — BENZTROPINE MESYLATE 0.5 MG PO TABS
0.5000 mg | ORAL_TABLET | Freq: Every day | ORAL | Status: DC
  Administered 2017-04-13 – 2017-04-20 (×8): 0.5 mg via ORAL
  Filled 2017-04-11 (×10): qty 1

## 2017-04-11 MED ORDER — ASPIRIN EC 81 MG PO TBEC
81.0000 mg | DELAYED_RELEASE_TABLET | Freq: Every day | ORAL | Status: DC
  Administered 2017-04-12 – 2017-04-16 (×5): 81 mg via ORAL
  Filled 2017-04-11 (×5): qty 1

## 2017-04-11 MED ORDER — ACETAMINOPHEN 650 MG RE SUPP: 650.0000 mg | Freq: Four times a day (QID) | RECTAL | Status: DC | PRN

## 2017-04-11 MED ORDER — SODIUM CHLORIDE 0.9 % IV BOLUS (SEPSIS)
500.0000 mL | Freq: Once | INTRAVENOUS | Status: AC
  Administered 2017-04-12: 500 mL via INTRAVENOUS

## 2017-04-11 MED ORDER — NITROGLYCERIN 0.4 MG SL SUBL
0.4000 mg | SUBLINGUAL_TABLET | SUBLINGUAL | Status: DC | PRN
  Administered 2017-04-13 – 2017-04-18 (×3): 0.4 mg via SUBLINGUAL
  Filled 2017-04-11 (×3): qty 1

## 2017-04-11 MED ORDER — ARIPIPRAZOLE 5 MG PO TABS
5.0000 mg | ORAL_TABLET | Freq: Every day | ORAL | Status: DC
  Administered 2017-04-12 – 2017-04-21 (×10): 5 mg via ORAL
  Filled 2017-04-11 (×10): qty 1

## 2017-04-11 MED ORDER — ACETAMINOPHEN 325 MG PO TABS
650.0000 mg | ORAL_TABLET | Freq: Four times a day (QID) | ORAL | Status: DC | PRN
  Administered 2017-04-12 – 2017-04-21 (×4): 650 mg via ORAL
  Filled 2017-04-11 (×5): qty 2

## 2017-04-11 NOTE — ED Provider Notes (Signed)
[facility] DEPT Provider Note   CSN: [id number] Arrival date & time: 04/11/17  1553     History   Chief Complaint Chief Complaint  Patient presents with  . Weakness    HPI Michele [name] is a 66 y.o. female.  The history is provided by the patient and a relative. No language interpreter was used.    Michele [name] is a 66 y.o. female who presents to the Emergency Department complaining of stroke like symptoms.  She is brought in by her family for evaluation of 2 days of stroke like symptoms. 2 days ago she had difficulty getting back to her bed from the bathroom and was found lying on the floor. She was helped back to the bed. Since that time she has had speech difficulties with confusion and frequent falling. Family states that she has tremor-like movements and gait instability. They brought her to her PCPs office today who referred her to the emergency department. A week ago she was diagnosed with shingles.  Past Medical History:  Diagnosis Date  . Allergic rhinitis   . 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 Asthma   . CAD (coronary artery disease)   . CAD (coronary artery disease) 01/30/2007   stents trivial coronary artery disease diffusely, with a recent deployment od a intracoronary artery stent, 3.5 x 12 mm driver stent with no more than 20-30% in- stents restenosis.done by Dr [name] with re-look on novenber 10 2008 revealing a widely patent stent with otherwise trival CAD and normal LV function  . Carpal tunnel syndrome, right   . Chest pain 07/17/12009   2 D Echo EF >55%  . Depression   . Diabetes mellitus type II   . Gait instability   . Hyperlipidemia   . Hypertension   . Iron deficiency anemia 07/13/2016  . Obesity   . Paroxysmal A-fib ([place])   . PTSD (post-traumatic stress disorder)   . Sleep apnea     Patient Active Problem List   Diagnosis Date Noted  . AKI (acute kidney injury) ([place]) 04/11/2017  . AF (paroxysmal atrial  fibrillation) ([place]) 04/11/2017  . Anticoagulated 04/11/2017  . Hyperkalemia 04/11/2017  . Rhabdomyolysis 04/11/2017  . ARF (acute renal failure) ([place]) 04/11/2017  . Iron deficiency anemia 07/13/2016  . Stroke ([place]) 11/19/2015  . Dyspnea on exertion 07/31/2015  . Abnormality of gait 05/22/2015  . Spinal stenosis in cervical region 05/22/2015  . Hypotension 10-Apr-202016  . Hyperlipidemia 07/17/2013  . Chest pain on exertion 12/14/2012  . Long term current use of anticoagulant therapy 10/03/2012  . Major depressive disorder, recurrent episode, moderate ([place]) 09/02/2011  . Menopausal and perimenopausal disorder 05/27/2011  . Depression 05/27/2011  . Urethrocele(618.03) 05/27/2011  . Anemia, iron deficiency 05/27/2011  . Paroxysmal A-fib ([place]) 05/27/2011  . Vitamin D deficiency 05/27/2011  . PTSD (post-traumatic stress disorder) 05/27/2011  . Abnormal brain MRI 05/27/2011  . PAD (peripheral artery disease) ([place]) 05/27/2011  . PFO (patent foramen ovale) 05/27/2011  . Diabetes mellitus, type II ([place])   . DM 12/16/2009  . 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 Obstructive sleep apnea 05/10/2007  . Essential hypertension 05/10/2007  . Coronary atherosclerosis 05/10/2007  . Seasonal and perennial allergic rhinitis 05/10/2007  . Asthma, mild intermittent 05/10/2007  . REFLUX, ESOPHAGEAL 05/10/2007    Past Surgical History:  Procedure Laterality Date  . CHOLECYSTECTOMY    . GALLBLADDER SURGERY    . HEMORRHOID SURGERY    . Kidney stones    .  Left groin cyst    . multilevel lumbar degenerative disc disease    . stress myocardial perfusion study  09/08/2010   normal; EF 75%    OB History    No data available       Home Medications    Prior to Admission medications   Medication Sig Start Date End Date Taking? Authorizing Provider  ADVAIR DISKUS 250-50 MCG/DOSE AEPB INHALE ONE PUFF TWICE DAILY 06/03/16  Yes Young, Clinton D, MD  albuterol (VENTOLIN HFA) 108 (90 Base) MCG/ACT  inhaler Inhale 1 puff into the lungs every 6 (six) hours as needed for wheezing or shortness of breath. 10/18/16  Yes Curt Bears, MD  ARIPiprazole (ABILIFY) 5 MG tablet TAKE ONE (1) TABLET BY MOUTH EVERY DAY Patient taking differently: Take 5 mg by mouth daily.  12/30/16  Yes Arfeen, Arlyce Harman, MD  aspirin EC 81 MG tablet Take 81 mg by mouth daily.   Yes [provider]  benztropine (COGENTIN) 0.5 MG tablet Take 1 tablet (0.5 mg total) by mouth at bedtime. 12/30/16  Yes Arfeen, Arlyce Harman, MD  diltiazem (CARDIZEM CD) 180 MG 24 hr capsule TAKE ONE CAPSULE BY MOUTH DAILY Patient taking differently: TAKE ONE CAPSULE (180 MG) BY MOUTH DAILY 01/12/17  Yes Forrest Moron, MD  furosemide (LASIX) 40 MG tablet TAKE ONE (1) TABLET BY MOUTH EVERY DAY Patient taking differently: TAKE ONE (1) TABLET (40 MG) BY MOUTH EVERY DAY 08/15/14  Yes Lorretta Harp, MD  gabapentin (NEURONTIN) 300 MG capsule Take 300 mg by mouth at bedtime. 07/04/15  Yes [provider]  irbesartan-hydrochlorothiazide (AVALIDE) 150-12.5 MG tablet TAKE 1 TABLET BY MOUTH DAILY. NEEDS APPOINTMENT BEFORE ANYMORE REFILLS. 03/24/17  Yes Lorretta Harp, MD  isosorbide mononitrate (IMDUR) 30 MG 24 hr tablet Take 1 tablet (30 mg total) by mouth daily. Need appointment before any other refills 02/01/17  Yes Lorretta Harp, MD  metFORMIN (GLUCOPHAGE-XR) 500 MG 24 hr tablet TAKE ONE (1) TABLET BY MOUTH EVERY DAY WITH BREAKFAST Patient taking differently: TAKE ONE (1) TABLET (500 MG) BY MOUTH EVERY DAY WITH BREAKFAST 11/28/15  Yes Daub, Loura Back, MD  metoprolol tartrate (LOPRESSOR) 100 MG tablet TAKE 1 TABLET BY MOUTH 2 TIMES DAILY Patient taking differently: TAKE 1 TABLET (100 MG) BY MOUTH 2 TIMES DAILY 03/24/17  Yes Lorretta Harp, MD  nitroGLYCERIN (NITROSTAT) 0.4 MG SL tablet Place 1 tablet (0.4 mg total) under the tongue every 5 (five) minutes as needed. Patient taking differently: Place 0.4 mg under the tongue every 5 (five)  minutes as needed for chest pain.  05/12/15  Yes Lorretta Harp, MD  OXYGEN Inhale 4 L into the lungs continuous.   Yes [provider]  PRESCRIPTION MEDICATION Inhale into the lungs at bedtime. CPAP   Yes [provider]  rosuvastatin (CRESTOR) 10 MG tablet TAKE ONE (1) TABLET BY MOUTH EVERY DAY Patient taking differently: TAKE ONE (1) TABLET (10 MG)  BY MOUTH EVERY DAY 07/13/16  Yes Jaynee Eagles, PA-C  sertraline (ZOLOFT) 100 MG tablet TAKE TWO (2) TABLETS BY MOUTH DAILY Patient taking differently: Take 200 mg by mouth daily.  12/30/16  Yes Arfeen, Arlyce Harman, MD  warfarin (COUMADIN) 7.5 MG tablet TAKE ONE AND ONE-HALF (1 & 1/2) TO TWO (2) TABLETS BY MOUTH DAILY AS DIRECTED Patient taking differently: TAKE ONE TABLET (7.5 MG) BY MOUTH ON MONDAYS & FRIDAYS, TAKE ONE AND ONE-HALF TABLET (11.25 MG) ON ALL OTHER DAYS OF THE WEEK 07/15/16  Yes Lorretta Harp, MD  albuterol (PROVENTIL) (2.5 MG/3ML) 0.083% nebulizer solution Take 3 mLs (2.5 mg total) by nebulization every 6 (six) hours as needed. Patient not taking: Reported on 04/11/2017 10/25/14 04/20/17  Deneise Lever, MD  fexofenadine (ALLEGRA) 60 MG tablet Take 1 tablet (60 mg total) by mouth daily. Take 60 mg by mouth daily. Patient not taking: Reported on 04/11/2017 12/16/10   Deneise Lever, MD  HYDROcodone-acetaminophen [facility]) 10-325 MG per tablet Take 1 tablet by mouth 2 (two) times daily as needed (pain).  09/19/14   [provider]  irbesartan (AVAPRO) 150 MG tablet TAKE ONE (1) TABLET BY MOUTH EVERY DAY Patient not taking: Reported on 04/11/2017 11/11/16   Tereasa Coop, PA-C  isosorbide mononitrate (IMDUR) 30 MG 24 hr tablet TAKE ONE (1) TABLET BY MOUTH EVERY DAY Patient not taking: Reported on 04/11/2017 01/20/17   Lorretta Harp, MD  mometasone (NASONEX) 50 MCG/ACT nasal spray Place 2 sprays into the nose daily. Patient not taking: Reported on 04/11/2017 10/23/12   Deneise Lever, MD  Trospium Chloride 60 MG  CP24 TAKE ONE CAPSULE BY MOUTH DAILY Patient not taking: Reported on 04/11/2017 11/06/13   Collene Leyden, PA-C    Family History Family History  Problem Relation Age of Onset  . Coronary artery disease Mother   . Coronary artery disease Father   . Depression Father     Social History Social History  Substance Use Topics  . Smoking status: Never Smoker  . Smokeless tobacco: Never Used  . Alcohol use No     Allergies   Meprobamate   Review of Systems Review of Systems  All other systems reviewed and are negative.    Physical Exam Updated Vital Signs BP 100/63   Pulse (!) 52   Temp 98.2 F (36.8 C)   Resp 14   Ht 5' 2"  (1.575 m)   Wt 127 kg (280 lb)   SpO2 100%   BMI 51.21 kg/m   Physical Exam  Constitutional: She is oriented to person, place, and time. She appears well-developed and well-nourished.  HENT:  Head: Normocephalic and atraumatic.  Cardiovascular: Normal rate and regular rhythm.   No murmur heard. Pulmonary/Chest: Effort normal and breath sounds normal. No respiratory distress.  Abdominal: Soft. There is no tenderness. There is no rebound and no guarding.  Right hemi-abdomen with healing ulcerative lesions of vesicular lesions consistent with recent zoster  Musculoskeletal: She exhibits no edema or tenderness.  Neurological: She is alert and oriented to person, place, and time.  Slow speech. Mild expressive aphasia. 5 out of 5 strength in all 4 extremities.  Skin: Skin is warm and dry.  Psychiatric: She has a normal mood and affect. Her behavior is normal.  Nursing note and vitals reviewed.    ED Treatments / Results  Labs (all labs ordered are listed, but only abnormal results are displayed) Labs Reviewed  PROTIME-INR - Abnormal; Notable for the following:       Result Value   Prothrombin Time 34.5 (*)    All other components within normal limits  APTT - Abnormal; Notable for the following:    aPTT 46 (*)    All other components  within normal limits  CBC - Abnormal; Notable for the following:    Hemoglobin 10.4 (*)    HCT 34.9 (*)    MCV 69.7 (*)    MCH 20.8 (*)    MCHC 29.8 (*)    RDW 17.0 (*)  All other components within normal limits  COMPREHENSIVE METABOLIC PANEL - Abnormal; Notable for the following:    Potassium 5.2 (*)    Chloride 96 (*)    BUN 50 (*)    Creatinine, Ser 2.41 (*)    AST 51 (*)    GFR calc non Af Amer 20 (*)    GFR calc Af Amer 23 (*)    All other components within normal limits  URINALYSIS, ROUTINE W REFLEX MICROSCOPIC - Abnormal; Notable for the following:    APPearance HAZY (*)    All other components within normal limits  CK - Abnormal; Notable for the following:    Total CK 1,803 (*)    All other components within normal limits  I-STAT CHEM 8, ED - Abnormal; Notable for the following:    Sodium 134 (*)    Potassium 5.4 (*)    Chloride 98 (*)    BUN 50 (*)    Creatinine, Ser 2.60 (*)    Glucose, Bld 104 (*)    Calcium, Ion 1.00 (*)    All other components within normal limits  I-STAT VENOUS BLOOD GAS, ED - Abnormal; Notable for the following:    pCO2, Ven 61.6 (*)    pO2, Ven 25.0 (*)    Bicarbonate 29.5 (*)    All other components within normal limits  DIFFERENTIAL  LACTIC ACID, PLASMA  TROPONIN I  TROPONIN I  TROPONIN I  SODIUM, URINE, RANDOM  CREATININE, URINE, RANDOM  CK  BASIC METABOLIC PANEL  CBC  TSH  HEPATIC FUNCTION PANEL  MAGNESIUM  LACTIC ACID, PLASMA  I-STAT TROPONIN, ED  CBG MONITORING, ED    EKG  EKG Interpretation  Date/Time:  Monday April 11 2017 16:10:57 EDT Ventricular Rate:  62 PR Interval:    QRS Duration: 103 QT Interval:  418 QTC Calculation: 421 R Axis:   65 Text Interpretation:  Sinus rhythm Low voltage, precordial leads Nonspecific T abnormalities, lateral leads Confirmed by Quintella Reichert [phone]) on 04/11/2017 8:33:06 PM       Radiology Dg Chest 2 View  Result Date: 04/11/2017 CLINICAL DATA:  Altered mental  status EXAM: CHEST  2 VIEW COMPARISON:  05/03/2016 FINDINGS: Low lung volumes. The cardio pericardial silhouette is enlarged. There is pulmonary vascular congestion without overt pulmonary edema. The lungs are clear without focal pneumonia, edema, pneumothorax or pleural effusion. IMPRESSION: Low volume film with cardiomegaly and pulmonary vascular congestion. Electronically Signed   By: Misty Stanley M.D.   On: 04/11/2017 20:23   Ct Head Wo Contrast  Result Date: 04/11/2017 CLINICAL DATA:  66 year old female with altered level of consciousness. EXAM: CT HEAD WITHOUT CONTRAST TECHNIQUE: Contiguous axial images were obtained from the base of the skull through the vertex without intravenous contrast. COMPARISON:  05/15/2015 brain MR and 04/28/2015 head CT a FINDINGS: Brain: No evidence of acute infarction, hemorrhage, hydrocephalus, extra-axial collection or mass lesion/mass effect. Chronic small-vessel white matter ischemic changes and remote left basal ganglia infarct again noted. Vascular: No hyperdense vessel or unexpected calcification. Skull: Normal. Negative for fracture or focal lesion. Sinuses/Orbits: No acute finding. Other: None. IMPRESSION: 1. No evidence of acute intracranial abnormality 2. Chronic small-vessel white matter ischemic changes and remote left basal ganglia infarct. Electronically Signed   By: Margarette Canada M.D.   On: 04/11/2017 17:20    Procedures Procedures (including critical care time)  Medications Ordered in ED Medications  aspirin EC tablet 81 mg (not administered)  ARIPiprazole (ABILIFY) tablet 5 mg (not administered)  benztropine (COGENTIN) tablet 0.5 mg (not administered)  sertraline (ZOLOFT) tablet 200 mg (not administered)  albuterol (PROVENTIL) (2.5 MG/3ML) 0.083% nebulizer solution 3 mL (not administered)  mometasone-formoterol (DULERA) 200-5 MCG/ACT inhaler 2 puff (not administered)  nitroGLYCERIN (NITROSTAT) SL tablet 0.4 mg (not administered)  hydrALAZINE  (APRESOLINE) injection 10 mg (not administered)  valACYclovir (VALTREX) tablet 1,000 mg (not administered)  acetaminophen (TYLENOL) tablet 650 mg (not administered)    Or  acetaminophen (TYLENOL) suppository 650 mg (not administered)  ondansetron (ZOFRAN) tablet 4 mg (not administered)    Or  ondansetron (ZOFRAN) injection 4 mg (not administered)  0.9 %  sodium chloride infusion (not administered)  sodium chloride 0.9 % bolus 500 mL (not administered)  0.9 %  sodium chloride infusion ( Intravenous New Bag/Given 04/11/17 1837)     Initial Impression / Assessment and Plan / ED Course  I have reviewed the triage vital signs and the nursing notes.  Pertinent labs & imaging results that were available during my care of the patient were reviewed by me and considered in my medical decision making (see chart for details).   patient here for altered mental status for the last 2 days. She is encephalopathic on examination with no focal neurologic findings. Labs demonstrate acute renal failure. Providing IV fluid hydration. Hospitalist consulted for admission for ongoing workup and treatment.  Final Clinical Impressions(s) / ED Diagnoses   Final diagnoses:  AKI (acute kidney injury) ([place])  Altered mental status, unspecified altered mental status type    New Prescriptions New Prescriptions   No medications on file     Quintella Reichert, MD 04/11/17 2355

## 2017-04-11 NOTE — H&P (Signed)
History and Physical    Michele [name] NWG:[id number] DOB: Sep 17, 1950 DOA: 04/11/2017  PCP: [name], MD  Patient coming from: home.  Chief Complaint: Fall.  HPI: Michele [name] is a 66 y.o. female with history of paroxysmal atrial fibrillation, CAD status post stenting, sleep apnea, diabetes mellitus, chronic anemia, hypertension was brought to the ER the patient had a fall and was on the floor. As per patient's daughter patient was found on the floor this morning when patient's daughter was about to go to work. Patient did not lose consciousness. Last seen normal was the previous night. Patient as per the daughter was diagnosed with shingles last week and was on topical antibiotics. Since the diagnosis of shingles patient has not been eating well. Denies any nausea vomiting or diarrhea or chest pain or shortness of breath.   ED Course: in the ER patient was mildly confused initially. CT head was unremarkable. Patient was mildly hypotensive for which patient was given fluid bolus. Labs revealed acute renal failure. CK levels were elevated. Patient denies any chest pain or shortness of breath. On my exam patient is back to her baseline mental status as per the daughter. Patient is being admitted for acute renal failure and low normal blood pressure.  Review of Systems: As per HPI, rest all negative.   Past Medical History:  Diagnosis Date  . Allergic rhinitis   . 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 Asthma   . CAD (coronary artery disease)   . CAD (coronary artery disease) 01/30/2007   stents trivial coronary artery disease diffusely, with a recent deployment od a intracoronary artery stent, 3.5 x 12 mm driver stent with no more than 20-30% in- stents restenosis.done by Dr [name] with re-look on novenber 10 2008 revealing a widely patent stent with otherwise trival CAD and normal LV function  . Carpal tunnel syndrome, right   . Chest pain 07/17/12009   2 D Echo  EF >55%  . Depression   . Diabetes mellitus type II   . Gait instability   . Hyperlipidemia   . Hypertension   . Iron deficiency anemia 07/13/2016  . Obesity   . Paroxysmal A-fib ([place])   . PTSD (post-traumatic stress disorder)   . Sleep apnea     Past Surgical History:  Procedure Laterality Date  . CHOLECYSTECTOMY    . GALLBLADDER SURGERY    . HEMORRHOID SURGERY    . Kidney stones    . Left groin cyst    . multilevel lumbar degenerative disc disease    . stress myocardial perfusion study  09/08/2010   normal; EF 75%     reports that she has never smoked. She has never used smokeless tobacco. She reports that she does not drink alcohol or use drugs.  Allergies  Allergen Reactions  . Meprobamate Nausea And Vomiting    Family History  Problem Relation Age of Onset  . Coronary artery disease Mother   . Coronary artery disease Father   . Depression Father     Prior to Admission medications   Medication Sig Start Date End Date Taking? Authorizing Provider  ADVAIR DISKUS 250-50 MCG/DOSE AEPB INHALE ONE PUFF TWICE DAILY 06/03/16  Yes Young, Clinton D, MD  albuterol (VENTOLIN HFA) 108 (90 Base) MCG/ACT inhaler Inhale 1 puff into the lungs every 6 (six) hours as needed for wheezing or shortness of breath. 10/18/16  Yes Curt Bears, MD  ARIPiprazole (ABILIFY) 5 MG tablet TAKE  ONE (1) TABLET BY MOUTH EVERY DAY Patient taking differently: Take 5 mg by mouth daily.  12/30/16  Yes Arfeen, Arlyce Harman, MD  aspirin EC 81 MG tablet Take 81 mg by mouth daily.   Yes [provider]  benztropine (COGENTIN) 0.5 MG tablet Take 1 tablet (0.5 mg total) by mouth at bedtime. 12/30/16  Yes Arfeen, Arlyce Harman, MD  diltiazem (CARDIZEM CD) 180 MG 24 hr capsule TAKE ONE CAPSULE BY MOUTH DAILY Patient taking differently: TAKE ONE CAPSULE (180 MG) BY MOUTH DAILY 01/12/17  Yes Forrest Moron, MD  furosemide (LASIX) 40 MG tablet TAKE ONE (1) TABLET BY MOUTH EVERY DAY Patient taking differently: TAKE  ONE (1) TABLET (40 MG) BY MOUTH EVERY DAY 08/15/14  Yes Lorretta Harp, MD  gabapentin (NEURONTIN) 300 MG capsule Take 300 mg by mouth at bedtime. 07/04/15  Yes [provider]  irbesartan-hydrochlorothiazide (AVALIDE) 150-12.5 MG tablet TAKE 1 TABLET BY MOUTH DAILY. NEEDS APPOINTMENT BEFORE ANYMORE REFILLS. 03/24/17  Yes Lorretta Harp, MD  isosorbide mononitrate (IMDUR) 30 MG 24 hr tablet Take 1 tablet (30 mg total) by mouth daily. Need appointment before any other refills 02/01/17  Yes Lorretta Harp, MD  metFORMIN (GLUCOPHAGE-XR) 500 MG 24 hr tablet TAKE ONE (1) TABLET BY MOUTH EVERY DAY WITH BREAKFAST Patient taking differently: TAKE ONE (1) TABLET (500 MG) BY MOUTH EVERY DAY WITH BREAKFAST 11/28/15  Yes Daub, Loura Back, MD  metoprolol tartrate (LOPRESSOR) 100 MG tablet TAKE 1 TABLET BY MOUTH 2 TIMES DAILY Patient taking differently: TAKE 1 TABLET (100 MG) BY MOUTH 2 TIMES DAILY 03/24/17  Yes Lorretta Harp, MD  nitroGLYCERIN (NITROSTAT) 0.4 MG SL tablet Place 1 tablet (0.4 mg total) under the tongue every 5 (five) minutes as needed. Patient taking differently: Place 0.4 mg under the tongue every 5 (five) minutes as needed for chest pain.  05/12/15  Yes Lorretta Harp, MD  OXYGEN Inhale 4 L into the lungs continuous.   Yes [provider]  PRESCRIPTION MEDICATION Inhale into the lungs at bedtime. CPAP   Yes [provider]  rosuvastatin (CRESTOR) 10 MG tablet TAKE ONE (1) TABLET BY MOUTH EVERY DAY Patient taking differently: TAKE ONE (1) TABLET (10 MG)  BY MOUTH EVERY DAY 07/13/16  Yes Jaynee Eagles, PA-C  sertraline (ZOLOFT) 100 MG tablet TAKE TWO (2) TABLETS BY MOUTH DAILY Patient taking differently: Take 200 mg by mouth daily.  12/30/16  Yes Arfeen, Arlyce Harman, MD  warfarin (COUMADIN) 7.5 MG tablet TAKE ONE AND ONE-HALF (1 & 1/2) TO TWO (2) TABLETS BY MOUTH DAILY AS DIRECTED Patient taking differently: TAKE ONE TABLET (7.5 MG) BY MOUTH ON MONDAYS & FRIDAYS, TAKE  ONE AND ONE-HALF TABLET (11.25 MG) ON ALL OTHER DAYS OF THE WEEK 07/15/16  Yes Lorretta Harp, MD  albuterol (PROVENTIL) (2.5 MG/3ML) 0.083% nebulizer solution Take 3 mLs (2.5 mg total) by nebulization every 6 (six) hours as needed. Patient not taking: Reported on 04/11/2017 10/25/14 04/20/17  Deneise Lever, MD  fexofenadine (ALLEGRA) 60 MG tablet Take 1 tablet (60 mg total) by mouth daily. Take 60 mg by mouth daily. Patient not taking: Reported on 04/11/2017 12/16/10   Deneise Lever, MD  HYDROcodone-acetaminophen [facility]) 10-325 MG per tablet Take 1 tablet by mouth 2 (two) times daily as needed (pain).  09/19/14   [provider]  irbesartan (AVAPRO) 150 MG tablet TAKE ONE (1) TABLET BY MOUTH EVERY DAY Patient not taking: Reported on 04/11/2017 11/11/16  Tereasa Coop, PA-C  isosorbide mononitrate (IMDUR) 30 MG 24 hr tablet TAKE ONE (1) TABLET BY MOUTH EVERY DAY Patient not taking: Reported on 04/11/2017 01/20/17   Lorretta Harp, MD  mometasone (NASONEX) 50 MCG/ACT nasal spray Place 2 sprays into the nose daily. Patient not taking: Reported on 04/11/2017 10/23/12   Baird Lyons D, MD  Trospium Chloride 60 MG CP24 TAKE ONE CAPSULE BY MOUTH DAILY Patient not taking: Reported on 04/11/2017 11/06/13   Collene Leyden, [place]    Physical Exam: Vitals:   04/11/17 2156 04/11/17 2159 04/11/17 2200 04/11/17 2230  BP:   (!) 108/59 (!) 98/55  Pulse: (!) 59 (!) 58 (!) 58 (!) 54  Resp: 13 13 14 13   Temp:      TempSrc:      SpO2: 100% 100% 100% 97%  Weight:      Height:          Constitutional: moderately built and nourished. Vitals:   04/11/17 2156 04/11/17 2159 04/11/17 2200 04/11/17 2230  BP:   (!) 108/59 (!) 98/55  Pulse: (!) 59 (!) 58 (!) 58 (!) 54  Resp: 13 13 14 13   Temp:      TempSrc:      SpO2: 100% 100% 100% 97%  Weight:      Height:       Eyes: anicteric mild pallor. ENMT: no discharge from the ears eyes nose or mouth. Neck: no mass felt. No neck  rigidity. Respiratory: no rhonchi or crepitations. Cardiovascular: S1-S2 heard no murmurs appreciated. Abdomen: soft nontender bowel sounds present. Musculoskeletal: no edema. No joint effusion. Skin: rash on the right upper quadrant. Neurologic:alert awake oriented to time place and person. Moves all extremities. Psychiatric: up as normal. Normal affect.   Labs on Admission: I have personally reviewed following labs and imaging studies  CBC:  Recent Labs Lab 04/11/17 1659 04/11/17 1725  WBC  --  8.7  NEUTROABS  --  5.5  HGB 12.9 10.4*  HCT 38.0 34.9*  MCV  --  69.7*  PLT  --  595   Basic Metabolic Panel:  Recent Labs Lab 04/11/17 1659 04/11/17 1725  NA 134* 135  K 5.4* 5.2*  CL 98* 96*  CO2  --  27  GLUCOSE 104* 98  BUN 50* 50*  CREATININE 2.60* 2.41*  CALCIUM  --  9.8   GFR: Estimated Creatinine Clearance: 29.3 mL/min (A) (by C-G formula based on SCr of 2.41 mg/dL (H)). Liver Function Tests:  Recent Labs Lab 04/11/17 1725  AST 51*  ALT 25  ALKPHOS 104  BILITOT 0.3  PROT 7.9  ALBUMIN 4.1   No results for input(s): LIPASE, AMYLASE in the last 168 hours. No results for input(s): AMMONIA in the last 168 hours. Coagulation Profile:  Recent Labs Lab 04/08/17 1328 04/11/17 1725  INR 1.7 3.45   Cardiac Enzymes:  Recent Labs Lab 04/11/17 1725 04/11/17 2135  CKTOTAL 1,803*  --   TROPONINI  --  <0.03   BNP (last 3 results) No results for input(s): PROBNP in the last 8760 hours. HbA1C: No results for input(s): HGBA1C in the last 72 hours. CBG:  Recent Labs Lab 04/11/17 1957  GLUCAP 85   Lipid Profile: No results for input(s): CHOL, HDL, LDLCALC, TRIG, CHOLHDL, LDLDIRECT in the last 72 hours. Thyroid Function Tests: No results for input(s): TSH, T4TOTAL, FREET4, T3FREE, THYROIDAB in the last 72 hours. Anemia Panel: No results for input(s): VITAMINB12, FOLATE, FERRITIN, TIBC, IRON, RETICCTPCT in  the last 72 hours. Urine analysis:     Component Value Date/Time   COLORURINE YELLOW 04/11/2017 2200   APPEARANCEUR HAZY (A) 04/11/2017 2200   LABSPEC 1.016 04/11/2017 2200   LABSPEC 1.025 06/23/2007 0832   PHURINE 5.0 04/11/2017 2200   GLUCOSEU NEGATIVE 04/11/2017 2200   HGBUR NEGATIVE 04/11/2017 2200   BILIRUBINUR NEGATIVE 04/11/2017 2200   BILIRUBINUR negative 04/28/2015 1157   BILIRUBINUR neg 12/08/2014 1227   BILIRUBINUR Negative 06/23/2007 0832   KETONESUR NEGATIVE 04/11/2017 2200   PROTEINUR NEGATIVE 04/11/2017 2200   UROBILINOGEN 0.2 04/28/2015 1157   UROBILINOGEN 0.2 01/30/2009 0628   NITRITE NEGATIVE 04/11/2017 2200   LEUKOCYTESUR NEGATIVE 04/11/2017 2200   LEUKOCYTESUR Negative 06/23/2007 0832   Sepsis Labs: @LABRCNTIP (procalcitonin:4,lacticidven:4) )No results found for this or any previous visit (from the past 240 hour(s)).   Radiological Exams on Admission: Dg Chest 2 View  Result Date: 04/11/2017 CLINICAL DATA:  Altered mental status EXAM: CHEST  2 VIEW COMPARISON:  05/03/2016 FINDINGS: Low lung volumes. The cardio pericardial silhouette is enlarged. There is pulmonary vascular congestion without overt pulmonary edema. The lungs are clear without focal pneumonia, edema, pneumothorax or pleural effusion. IMPRESSION: Low volume film with cardiomegaly and pulmonary vascular congestion. Electronically Signed   By: Misty Stanley M.D.   On: 04/11/2017 20:23   Ct Head Wo Contrast  Result Date: 04/11/2017 CLINICAL DATA:  66 year old female with altered level of consciousness. EXAM: CT HEAD WITHOUT CONTRAST TECHNIQUE: Contiguous axial images were obtained from the base of the skull through the vertex without intravenous contrast. COMPARISON:  05/15/2015 brain MR and 04/28/2015 head CT a FINDINGS: Brain: No evidence of acute infarction, hemorrhage, hydrocephalus, extra-axial collection or mass lesion/mass effect. Chronic small-vessel white matter ischemic changes and remote left basal ganglia infarct again noted.  Vascular: No hyperdense vessel or unexpected calcification. Skull: Normal. Negative for fracture or focal lesion. Sinuses/Orbits: No acute finding. Other: None. IMPRESSION: 1. No evidence of acute intracranial abnormality 2. Chronic small-vessel white matter ischemic changes and remote left basal ganglia infarct. Electronically Signed   By: Margarette Canada M.D.   On: 04/11/2017 17:20    EKG: Independently reviewed. ormal sinus rhythm.  Assessment/Plan Principal Problem:   AKI (acute kidney injury) ([place]) Active Problems:   Obstructive sleep apnea   Essential hypertension   Coronary atherosclerosis   Asthma, mild intermittent   Diabetes mellitus, type II (HCC)   Anemia, iron deficiency   Paroxysmal A-fib (HCC)   AF (paroxysmal atrial fibrillation) (HCC)   Anticoagulated   Hyperkalemia   Rhabdomyolysis   ARF (acute renal failure) ([place])    1. Acute renal failure - likely from poor oral intake and dehydration and hypotension with patient continuing to take her ARB's and diuretics. At this time we will hold off ARB diuretics and continue with fluids and check FENa. Closely follow intake and output and metabolic panel. 2. Mild rhabdomyolysis likely from fall - hold statins. Hydrate and recheck CK levels. 3. Shingles - I have discussed with pharmacy and place patient on Valtrex dose according to pharmacy. 4. Paroxysmal atrial fibrillation - since patient's blood pressure is in the low-normal I'm holding all patients rate limiting antihypertensives. On Coumadin. Which will be dosed per pharmacy. 5. Sleep apnea on CPAP. 6. History of depression continue with Abilify and Cogentin. 7. Acute encephalopathy essentially improved likely from renal failure and medications - holding patient's Neurontin pain medications at this time.Closely observe. 8. Hypertension - holding antihypertensives due to low-normal blood pressure. 9. CAD denies  any chest pain.  X-ray pelvis is pending.   DVT prophylaxis:  Coumadin. Code Status: full code.  Family Communication:  patient's daughter.  Disposition Plan: home.  Consults called: physical therapy.  Admission status: inpatient.    Rise Patience MD [facility] Pager [phone].  If 7PM-7AM, please contact night-coverage [email or web address] Password [email or web address]  04/11/2017, 11:13 PM

## 2017-04-11 NOTE — ED Notes (Signed)
Patient transported to X-ray 

## 2017-04-11 NOTE — ED Notes (Signed)
Hospitalist at bedside 

## 2017-04-11 NOTE — ED Triage Notes (Signed)
Patient sent from MD office for stroke-like symptoms, >2d. Speak altered, patient very weak, unable to stand

## 2017-04-11 NOTE — ED Notes (Signed)
Pt's daughter Lavonia Drafts would like to be updated with any bed assignment. #[phone]

## 2017-04-12 ENCOUNTER — Inpatient Hospital Stay (HOSPITAL_COMMUNITY): Payer: Medicare Other

## 2017-04-12 ENCOUNTER — Other Ambulatory Visit: Payer: Self-pay

## 2017-04-12 DIAGNOSIS — W19XXXA Unspecified fall, initial encounter: Secondary | ICD-10-CM

## 2017-04-12 DIAGNOSIS — I480 Paroxysmal atrial fibrillation: Secondary | ICD-10-CM

## 2017-04-12 DIAGNOSIS — R4182 Altered mental status, unspecified: Secondary | ICD-10-CM

## 2017-04-12 LAB — CBC
HCT: 30.1 % — ABNORMAL LOW (ref 36.0–46.0)
HEMOGLOBIN: 9 g/dL — AB (ref 12.0–15.0)
MCH: 20.8 pg — AB (ref 26.0–34.0)
MCHC: 29.9 g/dL — ABNORMAL LOW (ref 30.0–36.0)
MCV: 69.5 fL — AB (ref 78.0–100.0)
Platelets: 300 10*3/uL (ref 150–400)
RBC: 4.33 MIL/uL (ref 3.87–5.11)
RDW: 17.4 % — ABNORMAL HIGH (ref 11.5–15.5)
WBC: 5.4 10*3/uL (ref 4.0–10.5)

## 2017-04-12 LAB — BASIC METABOLIC PANEL
ANION GAP: 8 (ref 5–15)
BUN: 45 mg/dL — ABNORMAL HIGH (ref 6–20)
CHLORIDE: 99 mmol/L — AB (ref 101–111)
CO2: 28 mmol/L (ref 22–32)
Calcium: 9.3 mg/dL (ref 8.9–10.3)
Creatinine, Ser: 1.39 mg/dL — ABNORMAL HIGH (ref 0.44–1.00)
GFR calc non Af Amer: 39 mL/min — ABNORMAL LOW (ref >60)
GFR, EST AFRICAN AMERICAN: 45 mL/min — AB (ref >60)
Glucose, Bld: 148 mg/dL — ABNORMAL HIGH (ref 65–99)
POTASSIUM: 5 mmol/L (ref 3.5–5.1)
Sodium: 135 mmol/L (ref 135–145)

## 2017-04-12 LAB — HEPATIC FUNCTION PANEL
ALBUMIN: 3.7 g/dL (ref 3.5–5.0)
ALT: 25 U/L (ref 14–54)
AST: 46 U/L — AB (ref 15–41)
Alkaline Phosphatase: 97 U/L (ref 38–126)
BILIRUBIN TOTAL: 0.5 mg/dL (ref 0.3–1.2)
Total Protein: 7.6 g/dL (ref 6.5–8.1)

## 2017-04-12 LAB — MAGNESIUM: Magnesium: 2 mg/dL (ref 1.7–2.4)

## 2017-04-12 LAB — TROPONIN I

## 2017-04-12 LAB — CK: Total CK: 1177 U/L — ABNORMAL HIGH (ref 38–234)

## 2017-04-12 LAB — TSH: TSH: 1.734 u[IU]/mL (ref 0.350–4.500)

## 2017-04-12 LAB — LACTIC ACID, PLASMA: Lactic Acid, Venous: 0.6 mmol/L (ref 0.5–1.9)

## 2017-04-12 NOTE — Progress Notes (Signed)
Pt is alert and confused with sister at bedside, off the monitor vital stable.

## 2017-04-12 NOTE — Progress Notes (Signed)
PROGRESS NOTE    Michele [name]  IOE:[id number] DOB: Apr 04, 1951 DOA: 04/11/2017 PCP: [name], MD    Brief Narrative:  66 year old female presented after a fall. Patient does have the past medical history of paroxysmal atrial dilation, coronary disease status post stenting, sleep apnea, type 2 diabetes mellitus, chronic anemia and hypertension. The morning of the day of admission, patient was found on the floor, apparently she fell the night before and was unable to stand back on her feet. Recent diagnosis of herpes zoster. On initial physical examination blood pressure 108/59, 98/55, heart rate 59, 54, respiratory 13, 14, oxygen saturation 100%. Lungs were clear to auscultation bilaterally, heart S1-S2 present and rhythmic, abdomen was soft nontender, no lower extremity edema, neurologically nonfocal. Sodium 135, potassium 5.2, chloride 96, bicarbonate 27, glucose 98, BUN 50, creatinine 2.41, total CPK 1803, white count 8.7, hemoglobin 13.4, hematocrit 34.9, platelets 311, INR 3.4, urinalysis negative for infection.  Head CT with no acte findings, chronic small vessel white matter ischemic changes and remote left basal ganglia infarct. Chest x-ray with increase in lung markings bilaterally, hilar congestion. EKG normal sinus rhythm, normal axis, normal intervals.  Patient admitted to the hospital with working diagnosis of rhabdomyolysis due to trauma complicated by acute kidney injury.    Assessment & Plan:   Principal Problem:   AKI (acute kidney injury) ([place]) Active Problems:   Obstructive sleep apnea   Essential hypertension   Coronary atherosclerosis   Asthma, mild intermittent   Diabetes mellitus, type II (HCC)   Anemia, iron deficiency   Paroxysmal A-fib (HCC)   AF (paroxysmal atrial fibrillation) (HCC)   Anticoagulated   Hyperkalemia   Rhabdomyolysis   ARF (acute renal failure) ([place])   1. Rhabdomyolysis due to mechanical fall. Will continue hydration, follow on cp in  am, renal function with serum cr trending down, will continue with isotonic saline at 75 ml per hour, no signs of volume overload.  2. Metabolic encephalopathy in the setting of dementia. Features of delirium, in the setting of dementia, will continue supportive medical care, doubt viral encephalitis, but will continue to monitor. Will continue ariprazole, cogentin, and sertraline per home regimen.    3. Acute kidney injury. Patient responding well to hydration, will continue with normal saline and will follow on renal panel in am, avoid hypotension or nephrotoxic medications.   4. Paroxysmal atrial fibrillation. Will continue rate control, will hold on telemetry monitoring.   5. Obstructive sleep apnea. Will continue oxymetry monitoring and supplemental oxygent per [place].   6. Hypertension. Will continue blood pressure monitoring  7. Herpes zoster. Pain control and acyclovir, some lesions have been crusted, few with still blisters. Continue droplet and contact precautions.    DVT prophylaxis: warfarin  Code Status: full Family Communication:  Disposition Plan:    Consultants:     Procedures:     Antimicrobials:       Subjective: Patient is very confused and disorientated, no agitation. Most information from patient's family and care giver at the beside, at home with mild dementia. No neck pain, or headache.   Objective: Vitals:   04/12/17 0105 04/12/17 0614 04/12/17 0734 04/12/17 0809  BP: (!) 122/95 (!) 111/97 131/71   Pulse: 63 (!) 59 77   Resp: 18 18 18    Temp: 98 F (36.7 C) 98.5 F (36.9 C)    TempSrc: Oral Oral    SpO2: 98% 100% 98% 98%  Weight: 128.5 kg (283 lb 6.4 oz)     Height:  5' 2"  (1.575 m)       Intake/Output Summary (Last 24 hours) at 04/12/17 1038 Last data filed at 04/12/17 0653  Gross per 24 hour  Intake                0 ml  Output                0 ml  Net                0 ml   Filed Weights   04/11/17 1612 04/12/17 0105  Weight: 127 kg  (280 lb) 128.5 kg (283 lb 6.4 oz)    Examination:  General: deconditioned Neurology: Awake, confused and disorienatated E ENT: mild pallor, no icterus, oral mucosa moist Cardiovascular: No JVD. S1-S2 present, rhythmic, no gallops, rubs, or murmurs. No lower extremity edema. Pulmonary: vesicular breath sounds bilaterally, adequate air movement, no wheezing, rhonchi or rales. Gastrointestinal. Abdomen flat, no organomegaly, non tender, no rebound or guarding Skin.  Right flan, band like rash, ulcerative, different stages, some crusted, no associated erythema.  Musculoskeletal: no joint deformities     Data Reviewed: I have personally reviewed following labs and imaging studies  CBC:  Recent Labs Lab 04/11/17 1659 04/11/17 1725 04/12/17 0912  WBC  --  8.7 5.4  NEUTROABS  --  5.5  --   HGB 12.9 10.4* 9.0*  HCT 38.0 34.9* 30.1*  MCV  --  69.7* 69.5*  PLT  --  311 170   Basic Metabolic Panel:  Recent Labs Lab 04/11/17 1659 04/11/17 1725 04/12/17 0126 04/12/17 0912  NA 134* 135  --  135  K 5.4* 5.2*  --  5.0  CL 98* 96*  --  99*  CO2  --  27  --  28  GLUCOSE 104* 98  --  148*  BUN 50* 50*  --  45*  CREATININE 2.60* 2.41*  --  1.39*  CALCIUM  --  9.8  --  9.3  MG  --   --  2.0  --    GFR: Estimated Creatinine Clearance: 51.2 mL/min (A) (by C-G formula based on SCr of 1.39 mg/dL (H)). Liver Function Tests:  Recent Labs Lab 04/11/17 1725 04/12/17 0912  AST 51* 46*  ALT 25 25  ALKPHOS 104 97  BILITOT 0.3 0.5  PROT 7.9 7.6  ALBUMIN 4.1 3.7   No results for input(s): LIPASE, AMYLASE in the last 168 hours. No results for input(s): AMMONIA in the last 168 hours. Coagulation Profile:  Recent Labs Lab 04/08/17 1328 04/11/17 1725  INR 1.7 3.45   Cardiac Enzymes:  Recent Labs Lab 04/11/17 1725 04/11/17 2135 04/12/17 0126 04/12/17 0912  CKTOTAL 1,803*  --   --  1,177*  TROPONINI  --  <0.03 <0.03 <0.03   BNP (last 3 results) No results for input(s):  PROBNP in the last 8760 hours. HbA1C: No results for input(s): HGBA1C in the last 72 hours. CBG:  Recent Labs Lab 04/11/17 1957  GLUCAP 85   Lipid Profile: No results for input(s): CHOL, HDL, LDLCALC, TRIG, CHOLHDL, LDLDIRECT in the last 72 hours. Thyroid Function Tests:  Recent Labs  04/12/17 0126  TSH 1.734   Anemia Panel: No results for input(s): VITAMINB12, FOLATE, FERRITIN, TIBC, IRON, RETICCTPCT in the last 72 hours.    Radiology Studies: I have reviewed all of the imaging during this hospital visit personally     Scheduled Meds: . ARIPiprazole  5 mg Oral Daily  . aspirin EC  81 mg Oral Daily  . benztropine  0.5 mg Oral QHS  . mometasone-formoterol  2 puff Inhalation BID  . sertraline  200 mg Oral Daily  . valACYclovir  1,000 mg Oral Daily   Continuous Infusions: . sodium chloride 125 mL/hr at 04/12/17 1021     LOS: 1 day        Tawni Millers, MD [facility] Pager [phone]

## 2017-04-12 NOTE — Progress Notes (Signed)
Patient is very confused. Claiming people had sex with her during the night "a bunch of white nurses". Patient thinks she is dying. She is alert to self, but disoriented to time and situation. She thinks she is being kept here "while they let everyone else go". Patient's family will be contacted and made aware of the the patient's fall and current mental status.

## 2017-04-12 NOTE — Plan of Care (Signed)
Problem: Nutrition: Goal: Adequate nutrition will be maintained Outcome: Progressing Poor appetite temp 99.0 denies nausea

## 2017-04-12 NOTE — Progress Notes (Signed)
Pt refused to allow me (RT) to stick her for an arterial blood gas ordered by the MD.  RN made aware and is working on Paging MD for further instructions.

## 2017-04-12 NOTE — Plan of Care (Signed)
Problem: Education: Goal: Knowledge of disease or condition will improve Outcome: Progressing Improvement with cognition. With loose thoughts follows simple commands.

## 2017-04-12 NOTE — ED Notes (Signed)
Daughter updated on pt's bed assignment.

## 2017-04-12 NOTE — Progress Notes (Signed)
Unable to do history, pt confused.

## 2017-04-12 NOTE — Progress Notes (Signed)
Pt is alert and confused 2-3. Was going to the bathroom and lost balance soft bump to back of head with no visible injury. No complaints of pain. md paged and notified.

## 2017-04-12 NOTE — Plan of Care (Signed)
Problem: Respiratory: Goal: Ability to maintain a clear airway will improve Outcome: Progressing [place] Cough deep breath

## 2017-04-13 ENCOUNTER — Encounter (HOSPITAL_COMMUNITY): Payer: Self-pay | Admitting: General Practice

## 2017-04-13 DIAGNOSIS — Z794 Long term (current) use of insulin: Secondary | ICD-10-CM

## 2017-04-13 DIAGNOSIS — G0490 Encephalitis and encephalomyelitis, unspecified: Secondary | ICD-10-CM

## 2017-04-13 DIAGNOSIS — E11620 Type 2 diabetes mellitus with diabetic dermatitis: Secondary | ICD-10-CM

## 2017-04-13 DIAGNOSIS — E875 Hyperkalemia: Secondary | ICD-10-CM

## 2017-04-13 LAB — PROTIME-INR
INR: 3.26
Prothrombin Time: 33 seconds — ABNORMAL HIGH (ref 11.4–15.2)

## 2017-04-13 LAB — BASIC METABOLIC PANEL
ANION GAP: 9 (ref 5–15)
BUN: 17 mg/dL (ref 6–20)
CALCIUM: 9.8 mg/dL (ref 8.9–10.3)
CO2: 28 mmol/L (ref 22–32)
Chloride: 100 mmol/L — ABNORMAL LOW (ref 101–111)
Creatinine, Ser: 0.72 mg/dL (ref 0.44–1.00)
GFR calc Af Amer: >60 mL/min (ref >60)
GLUCOSE: 144 mg/dL — AB (ref 65–99)
POTASSIUM: 4 mmol/L (ref 3.5–5.1)
Sodium: 137 mmol/L (ref 135–145)

## 2017-04-13 LAB — TROPONIN I: Troponin I: 0.09 ng/mL (ref <0.03)

## 2017-04-13 LAB — CK: Total CK: 976 U/L — ABNORMAL HIGH (ref 38–234)

## 2017-04-13 MED ORDER — DEXTROSE IN LACTATED RINGERS 5 % IV SOLN
INTRAVENOUS | Status: DC
  Administered 2017-04-13 – 2017-04-14 (×3): via INTRAVENOUS
  Filled 2017-04-13: qty 1000

## 2017-04-13 MED ORDER — ASPIRIN 81 MG PO CHEW
324.0000 mg | CHEWABLE_TABLET | Freq: Once | ORAL | Status: AC
  Administered 2017-04-13: 324 mg via ORAL
  Filled 2017-04-13: qty 4

## 2017-04-13 MED ORDER — METOPROLOL TARTRATE 25 MG PO TABS
25.0000 mg | ORAL_TABLET | Freq: Two times a day (BID) | ORAL | Status: DC
Start: 2017-04-13 — End: 2017-04-21
  Administered 2017-04-13 – 2017-04-21 (×17): 25 mg via ORAL
  Filled 2017-04-13 (×17): qty 1

## 2017-04-13 MED ORDER — DEXTROSE 5 % IV SOLN
1250.0000 mg | Freq: Three times a day (TID) | INTRAVENOUS | Status: DC
  Administered 2017-04-13 – 2017-04-21 (×23): 1250 mg via INTRAVENOUS
  Filled 2017-04-13 (×2): qty 25
  Filled 2017-04-13: qty 20
  Filled 2017-04-13 (×2): qty 25
  Filled 2017-04-13: qty 20
  Filled 2017-04-13: qty 10
  Filled 2017-04-13 (×2): qty 25
  Filled 2017-04-13 (×3): qty 20
  Filled 2017-04-13: qty 25
  Filled 2017-04-13: qty 20
  Filled 2017-04-13: qty 25
  Filled 2017-04-13: qty 20
  Filled 2017-04-13 (×2): qty 25
  Filled 2017-04-13 (×2): qty 20
  Filled 2017-04-13 (×2): qty 25
  Filled 2017-04-13: qty 20
  Filled 2017-04-13: qty 25
  Filled 2017-04-13 (×2): qty 20

## 2017-04-13 NOTE — Progress Notes (Signed)
RT placed pt on CPAP Pressure of 5 with 4L bled in. RT added H2O. Pt tolerating well and RT will vont to monitor, RT advised pt to call if needed anything.

## 2017-04-13 NOTE — Progress Notes (Addendum)
CRITICAL VALUE ALERT  Critical Value:  Troponin 0.09  Date & Time Notied:  04/13/17 2309  Provider Notified: Dr. Myna Hidalgo  Orders Received/Actions taken: Aspirin 324 mg

## 2017-04-13 NOTE — Progress Notes (Signed)
This RN called in to the room with the daughter. Pt complains of chest pain with pain scale 5/ 10. VS and EKG done. Nitroglycerin SL given x1 with relief. MD updated. Will continue to monitor pt.

## 2017-04-13 NOTE — Progress Notes (Signed)
PROGRESS NOTE    Michele [name]  OIZ:[id number] DOB: Jun 16, 1951 DOA: 04/11/2017 PCP: [name], MD    Brief Narrative:  66 year old female presented after a fall. Patient does have the past medical history of paroxysmal atrial dilation, coronary disease status post stenting, sleep apnea, type 2 diabetes mellitus, chronic anemia and hypertension. The morning of the day of admission, patient was found on the floor, apparently she fell the night before and was unable to stand back on her feet. Recent diagnosis of herpes zoster. On initial physical examination blood pressure 108/59, 98/55, heart rate 59, 54, respiratory 13, 14, oxygen saturation 100%. Lungs were clear to auscultation bilaterally, heart S1-S2 present and rhythmic, abdomen was soft nontender, no lower extremity edema, neurologically nonfocal. Sodium 135, potassium 5.2, chloride 96, bicarbonate 27, glucose 98, BUN 50, creatinine 2.41, total CPK 1803, white count 8.7, hemoglobin 13.4, hematocrit 34.9, platelets 311, INR 3.4, urinalysis negative for infection.  Head CT with no acte findings, chronic small vessel white matter ischemic changes and remote left basal ganglia infarct. Chest x-ray with increase in lung markings bilaterally, hilar congestion. EKG normal sinus rhythm, normal axis, normal intervals.  Patient admitted to the hospital with working diagnosis of rhabdomyolysis due to trauma complicated by acute kidney injury   Assessment & Plan:   Principal Problem:   AKI (acute kidney injury) ([place]) Active Problems:   Obstructive sleep apnea   Essential hypertension   Coronary atherosclerosis   Asthma, mild intermittent   Diabetes mellitus, type II (HCC)   Anemia, iron deficiency   Paroxysmal A-fib (HCC)   AF (paroxysmal atrial fibrillation) (HCC)   Anticoagulated   Hyperkalemia   Rhabdomyolysis   ARF (acute renal failure) ([place])   1. Rhabdomyolysis due to mechanical fall. Hydration, with balanced electrolyte  solutions and dextrose, cpk trendinf down, renal functio has improved, will follow ck in am, continue fall precautions and physical therapy.   2. 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 Patient continue to be agitated and at times not coherent, non focal and no facial rash, no cranial nerve dysfunction. No significant improvement from yesterday, will change antibiotic therapy to IV acyclovir and will follow on response. Continue neuro checks and supportive medical therapy.     3. Acute kidney injury.  Improved renal function with serum cr down to 0.72 with K at 4.0, will follow on renal panel in am, avoid hypotension and nephrotoxic medications, will continue hydration with balanced electrolyte solutions.   4. Paroxysmal atrial fibrillation.  Will resume low dose b blocker to prevent atrial fibrillation, patient on admission was on sinus rhythm. Continue to hold on diltiazem to prevent bradycardia, which was present on admission.   5. Obstructive sleep apnea. Oxymetry monitoring and supplemental oxygent per [place].   6. Hypertension. Stable blood pressure with systolic 338 to 250, will start on metoprolol. Continue to hold on irbersatan, hctz, isosorbide,   7. Herpes zoster. Pain control well controlled, most lesions with crust, with some still open ulcers, no blisters. will continue with IV acyclovir. Continue with droplet precautions.   DVT prophylaxis: warfarin  Code Status: full Family Communication:  Disposition Plan:    Consultants:     Procedures:     Antimicrobials:      Subjective: Patient with persistent confusion, and disorientation, no fever or chills, most information from patient's sister at the bedside, patient still not at her baseline.   Objective: Vitals:   04/13/17 5397 04/13/17 0248 04/13/17 0335  04/13/17 1407  BP: 132/70  (!) 157/89 (!) 147/77  Pulse: 93  94 89  Resp: 20  18 20     Temp: 98.5 F (36.9 C)  98.7 F (37.1 C) 98.8 F (37.1 C)  TempSrc: Oral  Oral Oral  SpO2: 99%  100% 100%  Weight:  127.6 kg (281 lb 6.4 oz)    Height:        Intake/Output Summary (Last 24 hours) at 04/13/17 1450 Last data filed at 04/13/17 0943  Gross per 24 hour  Intake              960 ml  Output             1500 ml  Net             -540 ml   Filed Weights   04/11/17 1612 04/12/17 0105 04/13/17 0248  Weight: 127 kg (280 lb) 128.5 kg (283 lb 6.4 oz) 127.6 kg (281 lb 6.4 oz)    Examination:  General: deconditioned Neurology: Awake and alert, non focal, confused. Orientated to person and placed, positive lack of attention and disorganized thinking.   E ENT: mild pallor, no icterus, oral mucosa moist Cardiovascular: No JVD. S1-S2 present, rhythmic, no gallops, rubs, or murmurs. No lower extremity edema. Pulmonary: vesicular breath sounds bilaterally, adequate air movement, no wheezing, rhonchi or rales. Gastrointestinal. Abdomen flat, no organomegaly, non tender, no rebound or guarding Skin. Rash at the right flank, crusted, with no blisters, positive open ulcers.  Musculoskeletal: no joint deformities     Data Reviewed: I have personally reviewed following labs and imaging studies  CBC:  Recent Labs Lab 04/11/17 1659 04/11/17 1725 04/12/17 0912  WBC  --  8.7 5.4  NEUTROABS  --  5.5  --   HGB 12.9 10.4* 9.0*  HCT 38.0 34.9* 30.1*  MCV  --  69.7* 69.5*  PLT  --  311 403   Basic Metabolic Panel:  Recent Labs Lab 04/11/17 1659 04/11/17 1725 04/12/17 0126 04/12/17 0912 04/13/17 0819  NA 134* 135  --  135 137  K 5.4* 5.2*  --  5.0 4.0  CL 98* 96*  --  99* 100*  CO2  --  27  --  28 28  GLUCOSE 104* 98  --  148* 144*  BUN 50* 50*  --  45* 17  CREATININE 2.60* 2.41*  --  1.39* 0.72  CALCIUM  --  9.8  --  9.3 9.8  MG  --   --  2.0  --   --    GFR: Estimated Creatinine Clearance: 88.6 mL/min (by C-G formula based on SCr of 0.72 mg/dL). Liver Function  Tests:  Recent Labs Lab 04/11/17 1725 04/12/17 0912  AST 51* 46*  ALT 25 25  ALKPHOS 104 97  BILITOT 0.3 0.5  PROT 7.9 7.6  ALBUMIN 4.1 3.7   No results for input(s): LIPASE, AMYLASE in the last 168 hours. No results for input(s): AMMONIA in the last 168 hours. Coagulation Profile:  Recent Labs Lab 04/08/17 1328 04/11/17 1725 04/13/17 0819  INR 1.7 3.45 3.26   Cardiac Enzymes:  Recent Labs Lab 04/11/17 1725 04/11/17 2135 04/12/17 0126 04/12/17 0912 04/13/17 0819  CKTOTAL 1,803*  --   --  1,177* 976*  TROPONINI  --  <0.03 <0.03 <0.03  --    BNP (last 3 results) No results for input(s): PROBNP in the last 8760 hours. HbA1C: No results for input(s): HGBA1C in the last 72 hours.  CBG:  Recent Labs Lab 04/11/17 1957  GLUCAP 85   Lipid Profile: No results for input(s): CHOL, HDL, LDLCALC, TRIG, CHOLHDL, LDLDIRECT in the last 72 hours. Thyroid Function Tests:  Recent Labs  04/12/17 0126  TSH 1.734   Anemia Panel: No results for input(s): VITAMINB12, FOLATE, FERRITIN, TIBC, IRON, RETICCTPCT in the last 72 hours.    Radiology Studies: I have reviewed all of the imaging during this hospital visit personally     Scheduled Meds: . ARIPiprazole  5 mg Oral Daily  . aspirin EC  81 mg Oral Daily  . benztropine  0.5 mg Oral QHS  . metoprolol tartrate  25 mg Oral BID  . mometasone-formoterol  2 puff Inhalation BID  . sertraline  200 mg Oral Daily   Continuous Infusions: . acyclovir 1,250 mg (04/13/17 1400)  . dextrose 5% lactated ringers 75 mL/hr at 04/13/17 1115     LOS: 2 days        Tawni Millers, MD [facility] Pager [phone]

## 2017-04-13 NOTE — Progress Notes (Signed)
[facility] PROGRESS NOTE  I saw and evaluated the patient, I personally obtain the key portions of the history and physical exam, I reviewed the physician assistant student documentation and agree with the physician assistant student medical decision-making. Further assessment and plan are as follows:  Please see attending's daily progress note.  Michele [name] M.D.  YEILYN GENT ESL:[id number] DOB: March 04, 1951 DOA: 04/11/2017 PCP: [name], MD  Brief Narrative Michele [name] is a 66 year old female who presented to the ED on 9/24 after a mechanical fall with a history of OSA, HTN, asthma, DM II, myelofibrosis, paroxysmal atrial fibrillation, CAD s/p stenting  Her daughter found her on the floor as she was unable to get up by herself. 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 While in the ED, she was found to have an acute kidney injury and rhabdomyolysis. She was started on IV fluids and admitted.   Assessment/Plan:  Principal Problem:   AKI (acute kidney injury) ([place]) Active Problems:   Obstructive sleep apnea   Essential hypertension   Coronary atherosclerosis   Asthma, mild intermittent   Diabetes mellitus, type II (HCC)   Anemia, iron deficiency   Paroxysmal A-fib (HCC)   Anticoagulated   Hyperkalemia   Rhabdomyolysis   1. Rhabdomyolysis due to mechanical fall -CK improving today to 976  from 1,803 on 9/24. Continue to monitor -Continue IV fluids at 47m/hr of D5LR  2. Acute kidney injury -Resolved. Cr today 0.72 from 2.60 on 9/24  3. 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 -Delirium likely aggravated by hospitalization. Continue supportive treatment.  -Continue home medications: aripiprazole,cogentin, sertraline  4. Herpes Zoster -On right flank, improving. Changed to IV Acyclovir  5. Paroxysmal atrial fibrillation -INR 3.26 this morning.  Pharmacy monitoring coumadin -Restarted on beta blocker.  6. HTN - Continue current medications. Restarted on beta blocker.  7. Asthma  -Stable at this time. Continue Dulera daily and Albuterol PRN  8. DM II -Continue diabetic diet.   9. CAD s/p stenting -Continue nitro PRN and Imdur for ongoing chest pain.   Code Status: Full Family Communication: Daughter at bedside Disposition Plan: Once medically stable  Consultants:  None  Procedures:  None  Antibiotics:  None  HPI/Subjective: History difficult to obtain due to confusion. Patient states rash is still painful and that she wants to use the restroom.   Objective: Vitals:   04/13/17 0108 04/13/17 0335  BP: 132/70 (!) 157/89  Pulse: 93 94  Resp: 20 18  Temp: 98.5 F (36.9 C) 98.7 F (37.1 C)  SpO2: 99% 100%    Intake/Output Summary (Last 24 hours) at 04/13/17 1014 Last data filed at 04/13/17 0943  Gross per 24 hour  Intake             1200 ml  Output             1500 ml  Net             -300 ml   Filed Weights   04/11/17 1612 04/12/17 0105 04/13/17 0248  Weight: 127 kg (280 lb) 128.5 kg (283 lb 6.4 oz) 127.6 kg (281 lb 6.4 oz)   Exam:  General:  No acute distress, appears anxious and confused  Cardiovascular: Regular rate and rhythm. No murmurs/rubs/gallops. 2+ peripheral pulses.   Respiratory: Clear to auscultation bilaterally. No increased work of breathing.   Abdomen: Soft, non distended, non tender.  Skin: right flank ulcerative rash, does not cross dermatome, mostly crusted with no erythema.   Musculoskeletal: No obvious joint deformity.    Data Reviewed: Basic Metabolic Panel:  Recent Labs Lab 04/11/17 1659 04/11/17 1725 04/12/17 0126 04/12/17 0912 04/13/17 0819  NA 134* 135  --  135 137  K 5.4* 5.2*  --  5.0 4.0  CL 98* 96*  --  99* 100*  CO2  --  27  --  28 28  GLUCOSE 104* 98  --  148* 144*  BUN 50* 50*  --  45* 17  CREATININE 2.60* 2.41*  --  1.39* 0.72  CALCIUM  --   9.8  --  9.3 9.8  MG  --   --  2.0  --   --    Liver Function Tests:  Recent Labs Lab 04/11/17 1725 04/12/17 0912  AST 51* 46*  ALT 25 25  ALKPHOS 104 97  BILITOT 0.3 0.5  PROT 7.9 7.6  ALBUMIN 4.1 3.7   CBC:  Recent Labs Lab 04/11/17 1659 04/11/17 1725 04/12/17 0912  WBC  --  8.7 5.4  NEUTROABS  --  5.5  --   HGB 12.9 10.4* 9.0*  HCT 38.0 34.9* 30.1*  MCV  --  69.7* 69.5*  PLT  --  311 300   Cardiac Enzymes:  Recent Labs Lab 04/11/17 1725 04/11/17 2135 04/12/17 0126 04/12/17 0912 04/13/17 0819  CKTOTAL 1,803*  --   --  1,177* 37*  TROPONINI  --  <0.03 <0.03 <0.03  --    CBG:  Recent Labs Lab 04/11/17 1957  GLUCAP 85    Studies: Dg Chest 2 View  Result Date: 04/11/2017 CLINICAL DATA:  Altered mental status EXAM: CHEST  2 VIEW COMPARISON:  05/03/2016 FINDINGS: Low lung volumes. The cardio pericardial silhouette is enlarged. There is pulmonary vascular congestion without overt pulmonary edema. The lungs are clear without focal pneumonia, edema, pneumothorax or pleural effusion. IMPRESSION: Low volume film with cardiomegaly and pulmonary vascular congestion. Electronically Signed   By: Misty Stanley M.D.   On: 04/11/2017 20:23   Dg Pelvis 1-2 Views  Result Date: 04/12/2017 CLINICAL DATA:  66 year old with confusion who fell earlier today. Initial encounter. EXAM: PELVIS - 1-2 VIEW COMPARISON:  Visualized pelvis on abdominal x-rays 11/27/2006. FINDINGS: No acute fractures identified involving the pelvis or either proximal femur. Hip joints anatomically aligned with well-preserved joint spaces for age. Sacroiliac joints and symphysis pubis intact. Degenerative changes involving the visualized lower lumbar spine. IMPRESSION: No acute osseous abnormality. Electronically Signed   By: Evangeline Dakin M.D.   On: 04/12/2017 10:04   Ct Head Wo Contrast  Result Date: 04/11/2017 CLINICAL DATA:  66 year old female with altered level of consciousness. EXAM: CT HEAD  WITHOUT CONTRAST TECHNIQUE: Contiguous axial images were obtained from the base of the skull through the vertex without intravenous contrast. COMPARISON:  05/15/2015 brain MR and 04/28/2015 head CT a FINDINGS: Brain: No evidence of acute infarction, hemorrhage, hydrocephalus, extra-axial collection or mass lesion/mass effect. Chronic small-vessel white matter ischemic changes and remote left basal ganglia infarct again noted. Vascular: No hyperdense vessel or unexpected calcification. Skull: Normal. Negative for fracture or focal lesion. Sinuses/Orbits: No acute finding. Other: None. IMPRESSION: 1. No evidence of acute intracranial abnormality 2. Chronic small-vessel white matter ischemic changes and remote left basal ganglia infarct. Electronically Signed   By: Margarette Canada M.D.   On: 04/11/2017 17:20   Scheduled Meds: . ARIPiprazole  5 mg Oral Daily  .  aspirin EC  81 mg Oral Daily  . benztropine  0.5 mg Oral QHS  . mometasone-formoterol  2 puff Inhalation BID  . sertraline  200 mg Oral Daily  . valACYclovir  1,000 mg Oral Daily   Time spent: 25 minutes  Minda Ditto, PA-S  [facility] If 7PM-7AM, please contact night-coverage at [email or web address], password [facility] 04/13/2017, 10:13 AM  LOS: 2 days

## 2017-04-13 NOTE — Progress Notes (Signed)
Pt lying in bed AAOx2  disorganized thinking and thoughts.Family at the bedside.

## 2017-04-14 LAB — CBC WITH DIFFERENTIAL/PLATELET
Basophils Absolute: 0 10*3/uL (ref 0.0–0.1)
Basophils Relative: 0 %
Eosinophils Absolute: 0.1 10*3/uL (ref 0.0–0.7)
Eosinophils Relative: 1 %
HEMATOCRIT: 30.1 % — AB (ref 36.0–46.0)
Hemoglobin: 9.3 g/dL — ABNORMAL LOW (ref 12.0–15.0)
LYMPHS ABS: 1.9 10*3/uL (ref 0.7–4.0)
Lymphocytes Relative: 25 %
MCH: 20.9 pg — AB (ref 26.0–34.0)
MCHC: 30.9 g/dL (ref 30.0–36.0)
MCV: 67.5 fL — AB (ref 78.0–100.0)
MONOS PCT: 9 %
Monocytes Absolute: 0.7 10*3/uL (ref 0.1–1.0)
NEUTROS ABS: 4.8 10*3/uL (ref 1.7–7.7)
Neutrophils Relative %: 65 %
Platelets: 338 10*3/uL (ref 150–400)
RBC: 4.46 MIL/uL (ref 3.87–5.11)
RDW: 16.8 % — AB (ref 11.5–15.5)
WBC: 7.5 10*3/uL (ref 4.0–10.5)

## 2017-04-14 LAB — BASIC METABOLIC PANEL
Anion gap: 9 (ref 5–15)
BUN: 7 mg/dL (ref 6–20)
CHLORIDE: 98 mmol/L — AB (ref 101–111)
CO2: 31 mmol/L (ref 22–32)
CREATININE: 0.62 mg/dL (ref 0.44–1.00)
Calcium: 9.8 mg/dL (ref 8.9–10.3)
GFR calc Af Amer: >60 mL/min (ref >60)
GFR calc non Af Amer: >60 mL/min (ref >60)
Glucose, Bld: 109 mg/dL — ABNORMAL HIGH (ref 65–99)
Potassium: 3.6 mmol/L (ref 3.5–5.1)
Sodium: 138 mmol/L (ref 135–145)

## 2017-04-14 LAB — PROTIME-INR
INR: 2.75
PROTHROMBIN TIME: 28.8 s — AB (ref 11.4–15.2)

## 2017-04-14 LAB — TROPONIN I: Troponin I: 0.08 ng/mL (ref <0.03)

## 2017-04-14 LAB — CK: Total CK: 587 U/L — ABNORMAL HIGH (ref 38–234)

## 2017-04-14 MED ORDER — WARFARIN SODIUM 5 MG PO TABS
5.0000 mg | ORAL_TABLET | Freq: Once | ORAL | Status: AC
  Administered 2017-04-14: 5 mg via ORAL
  Filled 2017-04-14: qty 1

## 2017-04-14 MED ORDER — ALUM & MAG HYDROXIDE-SIMETH 200-200-20 MG/5ML PO SUSP
30.0000 mL | Freq: Four times a day (QID) | ORAL | Status: DC | PRN
  Administered 2017-04-14 – 2017-04-15 (×2): 30 mL via ORAL
  Filled 2017-04-14 (×2): qty 30

## 2017-04-14 MED ORDER — WARFARIN - PHARMACIST DOSING INPATIENT
Freq: Every day | Status: DC
  Administered 2017-04-14: 1
  Administered 2017-04-15 – 2017-04-18 (×4)

## 2017-04-14 MED ORDER — PROMETHAZINE HCL 25 MG/ML IJ SOLN
12.5000 mg | Freq: Four times a day (QID) | INTRAMUSCULAR | Status: DC | PRN
  Administered 2017-04-15: 12.5 mg via INTRAVENOUS
  Filled 2017-04-14: qty 1

## 2017-04-14 MED ORDER — HYDROCODONE-ACETAMINOPHEN 10-325 MG PO TABS
1.0000 | ORAL_TABLET | Freq: Four times a day (QID) | ORAL | Status: DC | PRN
  Administered 2017-04-15 – 2017-04-19 (×5): 1 via ORAL
  Filled 2017-04-14 (×5): qty 1

## 2017-04-14 NOTE — Progress Notes (Signed)
Holding off on CPAP for the moment due to pt throwing up and being nauseous. RT to cont to monitor and RT advised family members to have RN call later to place pt on if she felt better.

## 2017-04-14 NOTE — Progress Notes (Signed)
ANTICOAGULATION CONSULT NOTE - Initial Consult  Pharmacy Consult for warfarin Indication: atrial fibrillation  Allergies  Allergen Reactions  . Meprobamate Nausea And Vomiting    Patient Measurements: Height: 5\' 2"  (157.5 cm) Weight: 284 lb (128.8 kg) (bed; pt states im hurting im bleeding leave me alone) IBW/kg (Calculated) : 50.1  Vital Signs: Temp: 98.6 F (37 C) (09/27 0610) Temp Source: Oral (09/27 0610) BP: 170/89 (09/27 1048) Pulse Rate: 84 (09/27 1048)  Labs:  Recent Labs  04/11/17 1725  04/12/17 0912 04/13/17 0819 04/13/17 2158 04/14/17 1109  HGB 10.4*  --  9.0*  --   --  9.3*  HCT 34.9*  --  30.1*  --   --  30.1*  PLT 311  --  300  --   --  338  APTT 46*  --   --   --   --   --   LABPROT 34.5*  --   --  33.0*  --  28.8*  INR 3.45  --   --  3.26  --  2.75  CREATININE 2.41*  --  1.39* 0.72  --  0.62  CKTOTAL 1,803*  --  1,177* 976*  --  587*  TROPONINI  --   < > <0.03  --  0.09* 0.08*  < > = values in this interval not displayed.  Estimated Creatinine Clearance: 89.1 mL/min (by C-G formula based on SCr of 0.62 mg/dL).   Medical History: Past Medical History:  Diagnosis Date  . Allergic rhinitis   . ARF (acute renal failure) ([place]) 03/2017  . 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 Asthma   . CAD (coronary artery disease)   . CAD (coronary artery disease) 01/30/2007   stents trivial coronary artery disease diffusely, with a recent deployment od a intracoronary artery stent, 3.5 x 12 mm driver stent with no more than 20-30% in- stents restenosis.done by Dr [name] with re-look on novenber 10 2008 revealing a widely patent stent with otherwise trival CAD and normal LV function  . Carpal tunnel syndrome, right   . Chest pain 07/17/12009   2 D Echo EF >55%  . Depression   . Diabetes mellitus type II   . Gait instability   . Hyperlipidemia   . Hypertension   . Iron deficiency anemia 07/13/2016  . Obesity   . Paroxysmal A-fib ([place])   . PTSD  (post-traumatic stress disorder)   . Sleep apnea     Medications:  Scheduled:  . ARIPiprazole  5 mg Oral Daily  . aspirin EC  81 mg Oral Daily  . benztropine  0.5 mg Oral QHS  . metoprolol tartrate  25 mg Oral BID  . mometasone-formoterol  2 puff Inhalation BID  . sertraline  200 mg Oral Daily  . warfarin  5 mg Oral ONCE-1800  . Warfarin - Pharmacist Dosing Inpatient   Does not apply q1800    Assessment: 62 yof has been on warfarin prior to admission for atrial fibrillation. Home regimen is 11.25 mg daily except 7.5 mg on Monday and Friday. INR at last appointment at 9/21 was subtherapeutic at 1.7. INR on admission was 3.45. At admission, patient was experiencing renal dysfunction after fall and dehydration, which could impact INR sensitivity. INR today is 2.75. CBC is stable. No signs/symptoms of bleeding.  Goal of Therapy:  INR 2-3 Monitor platelets by anticoagulation protocol: Yes   Plan:  Warfarin 5 mg tonight Daily INR and CBC as needed  Joelene Millin  Dara Lords, PharmD Clinical Pharmacist  Phone: [phone] 04/14/2017,3:07 PM

## 2017-04-14 NOTE — Progress Notes (Signed)
PROGRESS NOTE    Michele [name]  NLG:[id number] DOB: 1950-09-22 DOA: 04/11/2017 PCP: [name], MD    Brief Narrative:  66 year old female presented after a fall. Patient does have the past medical history of paroxysmal atrial dilation, coronary disease status post stenting, sleep apnea, type 2 diabetes mellitus, chronic anemia and hypertension. The morning of the day of admission, patient was found on the floor, apparently she fell the night before and was unable to stand back on her feet. Recent diagnosis of herpes zoster. On initial physical examination blood pressure 108/59, 98/55, heart rate 59, 54, respiratory 13, 14, oxygen saturation 100%. Lungs were clear to auscultation bilaterally, heart S1-S2 present and rhythmic, abdomen was soft nontender, no lower extremity edema, neurologically nonfocal. Sodium 135, potassium 5.2, chloride 96, bicarbonate 27, glucose 98, BUN 50, creatinine 2.41, total CPK 1803, white count 8.7, hemoglobin 13.4, hematocrit 34.9, platelets 311, INR 3.4, urinalysis negative for infection. Head CT with no acte findings, chronic small vessel white matter ischemic changes and remote left basal ganglia infarct. Chest x-ray with increase in lung markings bilaterally, hilar congestion. EKG normal sinus rhythm, normal axis, normal intervals.  Patient admitted to the hospital with working diagnosis of rhabdomyolysis due to trauma complicated by acute kidney injury   Assessment & Plan:   Principal Problem:   AKI (acute kidney injury) ([place]) Active Problems:   Obstructive sleep apnea   Essential hypertension   Coronary atherosclerosis   Asthma, mild intermittent   Diabetes mellitus, type II (HCC)   Anemia, iron deficiency   Paroxysmal A-fib (HCC)   AF (paroxysmal atrial fibrillation) (HCC)   Anticoagulated   Hyperkalemia   Rhabdomyolysis   ARF (acute renal failure) ([place])   1. 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 Changed antibiotic therapy to IV acyclovir, patient with mild improvement in mentation, improved attention but continue to have disorganized thinking, will continue supportive medical care with IV fluids, physical therapy and will request nutritional consult. Continue pain control for zoster. Continue sertraline for now.   3. Acute kidney injury with rhabdomyolysis. Patient has responded well to IV fluids, renal function with serum cr down to 0,.63 with K at 3,6 and Na at 138, will continue hydration with balanced electrolyte solutions LR, and will follow on renal panel in am. CK trending down.   4. Paroxysmal atrial fibrillation. Tolerating well metoprolol 25 mg po bid, rate continue to be controlled, will hold on diltiazem. Pharmacy consultation to follow up on INR and warfarin dosing.   5. Obstructive sleep apnea. Continue oxymetry monitoring.   6. Hypertension. Blood pressure 130 to 170, will continue with metoprolol. If worsening will consider to resume isosorbide or losartan. Hold diuretics for now.  7. Herpes zoster. Patient continue to complain of pain at the site of the rash, will continue analgesia with acetaminophen and will add morphine for severe pain.  DVT prophylaxis:warfarin Code Status:full Family Communication:I spoke with patient's sister at the beside and all questions were addressed.  Disposition Plan:home    Consultants:    Procedures:    Antimicrobials:   IV acyclovir    Subjective: Patient continue to complains of abdominal pain at the rash region, has been more calm, and responding to simple questions, poor oral intake, continue confused and disorientated with no significant episodes of agitation.   Objective: Vitals:   04/13/17 2041 04/14/17 0610 04/14/17 0840 04/14/17 1048  BP:  138/74  (!) 170/89  Pulse: 89 63  84  Resp: 18     Temp:  98.6 F (37 C)    TempSrc:  Oral    SpO2: 100% 98% 96%   Weight:   128.8 kg (284 lb)    Height:        Intake/Output Summary (Last 24 hours) at 04/14/17 1311 Last data filed at 04/14/17 1258  Gross per 24 hour  Intake          2006.25 ml  Output             1150 ml  Net           856.25 ml   Filed Weights   04/12/17 0105 04/13/17 0248 04/14/17 0610  Weight: 128.5 kg (283 lb 6.4 oz) 127.6 kg (281 lb 6.4 oz) 128.8 kg (284 lb)    Examination:  General: deconditioned Neurology: Awake and alert, non focal  E ENT: mild  pallor, no icterus, oral mucosa moist Cardiovascular: No JVD. S1-S2 present, rhythmic, no gallops, rubs, or murmurs. No lower extremity edema. Pulmonary: vesicular breath sounds bilaterally, adequate air movement, no wheezing, rhonchi or rales. Gastrointestinal. Abdomen flat, no organomegaly, non tender, no rebound or guarding Skin. Rash at the right flank extending into the anterior abdomen with no erythema, no blisters, positive open wounds.  Musculoskeletal: no joint deformities     Data Reviewed: I have personally reviewed following labs and imaging studies  CBC:  Recent Labs Lab 04/11/17 1659 04/11/17 1725 04/12/17 0912 04/14/17 1109  WBC  --  8.7 5.4 7.5  NEUTROABS  --  5.5  --  PENDING  HGB 12.9 10.4* 9.0* 9.3*  HCT 38.0 34.9* 30.1* 30.1*  MCV  --  69.7* 69.5* 67.5*  PLT  --  311 300 025   Basic Metabolic Panel:  Recent Labs Lab 04/11/17 1659 04/11/17 1725 04/12/17 0126 04/12/17 0912 04/13/17 0819 04/14/17 1109  NA 134* 135  --  135 137 138  K 5.4* 5.2*  --  5.0 4.0 3.6  CL 98* 96*  --  99* 100* 98*  CO2  --  27  --  28 28 31   GLUCOSE 104* 98  --  148* 144* 109*  BUN 50* 50*  --  45* 17 7  CREATININE 2.60* 2.41*  --  1.39* 0.72 0.62  CALCIUM  --  9.8  --  9.3 9.8 9.8  MG  --   --  2.0  --   --   --    GFR: Estimated Creatinine Clearance: 89.1 mL/min (by C-G formula based on SCr of 0.62 mg/dL). Liver Function Tests:  Recent Labs Lab 04/11/17 1725 04/12/17 0912  AST 51* 46*  ALT 25 25    ALKPHOS 104 97  BILITOT 0.3 0.5  PROT 7.9 7.6  ALBUMIN 4.1 3.7   No results for input(s): LIPASE, AMYLASE in the last 168 hours. No results for input(s): AMMONIA in the last 168 hours. Coagulation Profile:  Recent Labs Lab 04/08/17 1328 04/11/17 1725 04/13/17 0819 04/14/17 1109  INR 1.7 3.45 3.26 2.75   Cardiac Enzymes:  Recent Labs Lab 04/11/17 1725 04/11/17 2135 04/12/17 0126 04/12/17 0912 04/13/17 0819 04/13/17 2158 04/14/17 1109  CKTOTAL 1,803*  --   --  1,177* 976*  --  587*  TROPONINI  --  <0.03 <0.03 <0.03  --  0.09* 0.08*   BNP (last 3 results) No results for input(s): PROBNP in the last 8760 hours. HbA1C: No results for input(s): HGBA1C in the last 72 hours. CBG:  Recent Labs  Lab 04/11/17 1957  GLUCAP 85   Lipid Profile: No results for input(s): CHOL, HDL, LDLCALC, TRIG, CHOLHDL, LDLDIRECT in the last 72 hours. Thyroid Function Tests:  Recent Labs  04/12/17 0126  TSH 1.734   Anemia Panel: No results for input(s): VITAMINB12, FOLATE, FERRITIN, TIBC, IRON, RETICCTPCT in the last 72 hours.    Radiology Studies: I have reviewed all of the imaging during this hospital visit personally     Scheduled Meds: . ARIPiprazole  5 mg Oral Daily  . aspirin EC  81 mg Oral Daily  . benztropine  0.5 mg Oral QHS  . metoprolol tartrate  25 mg Oral BID  . mometasone-formoterol  2 puff Inhalation BID  . sertraline  200 mg Oral Daily   Continuous Infusions: . acyclovir Stopped (04/14/17 0636)  . dextrose 5% lactated ringers 75 mL/hr at 04/14/17 0112     LOS: 3 days        Tawni Millers, MD [facility] Pager [phone]

## 2017-04-14 NOTE — Progress Notes (Signed)
Nutrition Brief Note  Patient identified on the Malnutrition Screening Tool (MST) Report  Wt Readings from Last 15 Encounters:  04/14/17 284 lb (128.8 kg)  10/18/16 295 lb 4.8 oz (133.9 kg)  07/16/16 297 lb (134.7 kg)  07/13/16 295 lb 6.4 oz (134 kg)  05/20/16 299 lb 12.8 oz (136 kg)  05/04/16 297 lb 6.4 oz (134.9 kg)  03/16/16 (!) 303 lb (137.4 kg)  01/08/16 (!) 302 lb 3.2 oz (137.1 kg)  01/07/16 (!) 300 lb 11.2 oz (136.4 kg)  11/25/15 298 lb (135.2 kg)  11/19/15 298 lb 12 oz (135.5 kg)  11/06/15 299 lb 12.8 oz (136 kg)  08/08/15 293 lb (132.9 kg)  08/04/15 290 lb 12.8 oz (131.9 kg)  07/31/15 288 lb 6.4 oz (130.8 kg)   Michele [name] is a 66 yo female with PMH of CAD s/p stent, sleep apnea, DM, HTN, HLD, presents with fall, metabolic encephalopathy in setting of dementia, rhabdomyolysis and AKI  Spoke with patient's sister at bedside. Patient is confused, unable to provide history. Sister states patient was moved to her daughter's house in July and has lost weight since then due to a more controlled diet. No other concerns at this time. Ate [place] with gravy, broccoli, and creamed potatoes for lunch.   Body mass index is 51.94 kg/m. Patient meets criteria for obese class III based on current BMI.   Current diet order is heart healthy/carb modified, patient is consuming approximately 75-100% of meals at this time. Labs and medications reviewed.   No nutrition interventions warranted at this time. If nutrition issues arise, please consult RD.   Michele [name]. Michele Cartwright, MS, RD LDN Inpatient Clinical Dietitian Pager [phone]

## 2017-04-15 DIAGNOSIS — I10 Essential (primary) hypertension: Secondary | ICD-10-CM

## 2017-04-15 LAB — CBC
HEMATOCRIT: 29.7 % — AB (ref 36.0–46.0)
Hemoglobin: 9.3 g/dL — ABNORMAL LOW (ref 12.0–15.0)
MCH: 20.8 pg — ABNORMAL LOW (ref 26.0–34.0)
MCHC: 31.3 g/dL (ref 30.0–36.0)
MCV: 66.4 fL — ABNORMAL LOW (ref 78.0–100.0)
Platelets: 366 10*3/uL (ref 150–400)
RBC: 4.47 MIL/uL (ref 3.87–5.11)
RDW: 17 % — ABNORMAL HIGH (ref 11.5–15.5)
WBC: 8.7 10*3/uL (ref 4.0–10.5)

## 2017-04-15 LAB — BASIC METABOLIC PANEL
Anion gap: 7 (ref 5–15)
BUN: 7 mg/dL (ref 6–20)
CALCIUM: 9.4 mg/dL (ref 8.9–10.3)
CO2: 31 mmol/L (ref 22–32)
Chloride: 96 mmol/L — ABNORMAL LOW (ref 101–111)
Creatinine, Ser: 0.77 mg/dL (ref 0.44–1.00)
GFR calc Af Amer: >60 mL/min (ref >60)
GLUCOSE: 155 mg/dL — AB (ref 65–99)
POTASSIUM: 3.7 mmol/L (ref 3.5–5.1)
SODIUM: 134 mmol/L — AB (ref 135–145)

## 2017-04-15 LAB — PROTIME-INR
INR: 2.47
Prothrombin Time: 26.5 seconds — ABNORMAL HIGH (ref 11.4–15.2)

## 2017-04-15 MED ORDER — ONDANSETRON HCL 4 MG/2ML IJ SOLN
4.0000 mg | INTRAMUSCULAR | Status: DC | PRN
  Administered 2017-04-15: 4 mg via INTRAVENOUS

## 2017-04-15 MED ORDER — POTASSIUM CHLORIDE 20 MEQ PO PACK
40.0000 meq | PACK | Freq: Once | ORAL | Status: AC
  Administered 2017-04-15: 40 meq via ORAL
  Filled 2017-04-15 (×2): qty 2

## 2017-04-15 MED ORDER — BOOST / RESOURCE BREEZE PO LIQD
1.0000 | Freq: Three times a day (TID) | ORAL | Status: DC
  Administered 2017-04-15: 1 via ORAL
  Administered 2017-04-15: 237 mL via ORAL
  Administered 2017-04-16 – 2017-04-21 (×12): 1 via ORAL

## 2017-04-15 MED ORDER — WARFARIN SODIUM 7.5 MG PO TABS
7.5000 mg | ORAL_TABLET | Freq: Once | ORAL | Status: AC
  Administered 2017-04-15: 7.5 mg via ORAL
  Filled 2017-04-15: qty 1

## 2017-04-15 MED ORDER — KETOROLAC TROMETHAMINE 15 MG/ML IJ SOLN
15.0000 mg | Freq: Four times a day (QID) | INTRAMUSCULAR | Status: DC | PRN
  Administered 2017-04-15: 15 mg via INTRAVENOUS
  Filled 2017-04-15: qty 1

## 2017-04-15 NOTE — Progress Notes (Signed)
ANTICOAGULATION CONSULT NOTE - Initial Consult  Pharmacy Consult for warfarin Indication: atrial fibrillation  Allergies  Allergen Reactions  . Meprobamate Nausea And Vomiting    Patient Measurements: Height: 5\' 2"  (157.5 cm) Weight: 279 lb (126.6 kg) IBW/kg (Calculated) : 50.1  Vital Signs: Temp: 98.4 F (36.9 C) (09/28 0430) Temp Source: Axillary (09/28 0430) BP: 169/98 (09/28 0430) Pulse Rate: 86 (09/28 0430)  Labs:  Recent Labs  04/12/17 0912 04/13/17 0819 04/13/17 2158 04/14/17 1109 04/15/17 0526  HGB 9.0*  --   --  9.3* 9.3*  HCT 30.1*  --   --  30.1* 29.7*  PLT 300  --   --  338 366  LABPROT  --  33.0*  --  28.8* 26.5*  INR  --  3.26  --  2.75 2.47  CREATININE 1.39* 0.72  --  0.62 0.77  CKTOTAL 1,177* 976*  --  587*  --   TROPONINI <0.03  --  0.09* 0.08*  --     Estimated Creatinine Clearance: 88.1 mL/min (by C-G formula based on SCr of 0.77 mg/dL).   Medical History: Past Medical History:  Diagnosis Date  . Allergic rhinitis   . ARF (acute renal failure) ([place]) 03/2017  . 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 Asthma   . CAD (coronary artery disease)   . CAD (coronary artery disease) 01/30/2007   stents trivial coronary artery disease diffusely, with a recent deployment od a intracoronary artery stent, 3.5 x 12 mm driver stent with no more than 20-30% in- stents restenosis.done by Dr [name] with re-look on novenber 10 2008 revealing a widely patent stent with otherwise trival CAD and normal LV function  . Carpal tunnel syndrome, right   . Chest pain 07/17/12009   2 D Echo EF >55%  . Depression   . Diabetes mellitus type II   . Gait instability   . Hyperlipidemia   . Hypertension   . Iron deficiency anemia 07/13/2016  . Obesity   . Paroxysmal A-fib ([place])   . PTSD (post-traumatic stress disorder)   . Sleep apnea     Medications:  Scheduled:  . ARIPiprazole  5 mg Oral Daily  . aspirin EC  81 mg Oral Daily  . benztropine  0.5 mg  Oral QHS  . metoprolol tartrate  25 mg Oral BID  . mometasone-formoterol  2 puff Inhalation BID  . sertraline  200 mg Oral Daily  . Warfarin - Pharmacist Dosing Inpatient   Does not apply q1800    Assessment: 61 yof has been on warfarin prior to admission for atrial fibrillation. Home regimen is 11.25 mg daily except 7.5 mg on Monday and Friday. INR on admission was 3.45. INR today is 2.47. CBC is stable. No signs/symptoms of bleeding.  Goal of Therapy:  INR 2-3 Monitor platelets by anticoagulation protocol: Yes   Plan:  Warfarin 7.5 mg tonight Daily INR and CBC as needed  Doylene Canard, PharmD Clinical Pharmacist  Phone: [phone] 04/15/2017,8:15 AM

## 2017-04-15 NOTE — Progress Notes (Signed)
Patent resting well on CPAP 5 cmH2O with 2L O2 added.

## 2017-04-15 NOTE — Progress Notes (Signed)
Pt is alert withdrawn, poor appetite, up to chair with therapy today. IV fluids cont. With shingles lesions open with crusted edges. plan to continue IV antivirals.

## 2017-04-15 NOTE — Progress Notes (Signed)
PROGRESS NOTE    Michele [name]  FWY:[id number] DOB: 08/16/1950 DOA: 04/11/2017 PCP: [name], MD    Brief Narrative:  66 year old female presented after a fall. Patient does have the past medical history of paroxysmal atrial dilation, coronary disease status post stenting, sleep apnea, type 2 diabetes mellitus, chronic anemia and hypertension. The morning of the day of admission, patient was found on the floor, apparently she fell the night before and was unable to stand back on her feet. Recent diagnosis of herpes zoster. On initial physical examination blood pressure 108/59, 98/55, heart rate 59, 54, respiratory 13, 14, oxygen saturation 100%. Lungs were clear to auscultation bilaterally, heart S1-S2 present and rhythmic, abdomen was soft nontender, no lower extremity edema, neurologically nonfocal. Sodium 135, potassium 5.2, chloride 96, bicarbonate 27, glucose 98, BUN 50, creatinine 2.41, total CPK 1803, white count 8.7, hemoglobin 13.4, hematocrit 34.9, platelets 311, INR 3.4, urinalysis negative for infection. Head CT with no acte findings, chronic small vessel white matter ischemic changes and remote left basal ganglia infarct. Chest x-ray with increase in lung markings bilaterally, hilar congestion. EKG normal sinus rhythm, normal axis, normal intervals.  Patient admitted to the hospital with working diagnosis of rhabdomyolysis due to trauma complicated by acute kidney injury   Assessment & Plan:   Principal Problem:   AKI (acute kidney injury) ([place]) Active Problems:   Obstructive sleep apnea   Essential hypertension   Coronary atherosclerosis   Asthma, mild intermittent   Diabetes mellitus, type II (HCC)   Anemia, iron deficiency   Paroxysmal A-fib (HCC)   AF (paroxysmal atrial fibrillation) (HCC)   Anticoagulated   Hyperkalemia   Rhabdomyolysis   ARF (acute renal failure) ([place])   1. 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 Clinically encephalopathy has improved, patient more coherent, improved attention and organized thinking, will continue with IV acyclovir for now, neuro checks and physical therapy evaluation, will continue with IV dextrose and will request a nutrition consultation. Will add ketorolac for pain control, non sedating analgesia.   3. Acute kidney injury with rhabdomyolysis.Renal function has recovered well, will decrease rate of IV fluids and will follow on renal panel in am. Avoid hypotension.   4. Paroxysmal atrial fibrillation. Continue with low dose metoprolol 25 mg po bid, at home on 200 mg of metoprolol plus diltiazem. Resumed warfarin per pharmacy protocol.  5. Obstructive sleep apnea. Continue supplemental 02 per [place] as needed.   6. Hypertension. Blood pressure 160 to 170, tolerating well metoprolol.   7. Herpes zoster. Will use ketorolac and morphine for pain control. Most lesions have crusted will consider to remove isolation, will discuss with infection control.   DVT prophylaxis:warfarin Code Status:full Family Communication:I spoke with patient's sister at the beside and all questions were addressed.  Disposition Plan:home    Consultants:    Procedures:    Antimicrobials:   IV acyclovir    Subjective: Patient had a significant improvement in her mentation, decreased confusion and disorientation, continue to have pain on the right side   Objective: Vitals:   04/14/17 2224 04/14/17 2318 04/15/17 0430 04/15/17 0919  BP:  (!) 157/91 (!) 169/98   Pulse: 92 (!) 101 86   Resp: 18 18 20    Temp:  99.2 F (37.3 C) 98.4 F (36.9 C)   TempSrc:  Oral Axillary   SpO2: 95% 98% 99% 99%  Weight:   126.6 kg (279 lb)   Height:  Intake/Output Summary (Last 24 hours) at 04/15/17 1122 Last data filed at 04/15/17 0700  Gross per 24 hour  Intake             2825 ml  Output             1750 ml  Net             1075 ml    Filed Weights   04/13/17 0248 04/14/17 0610 04/15/17 0430  Weight: 127.6 kg (281 lb 6.4 oz) 128.8 kg (284 lb) 126.6 kg (279 lb)    Examination:  General: Not in pain or dyspnea Neurology: Awake and alert, non focal  E ENT: no pallor, no icterus, oral mucosa moist Cardiovascular: No JVD. S1-S2 present, rhythmic, no gallops, rubs, or murmurs. No lower extremity edema. Pulmonary: vesicular breath sounds bilaterally, adequate air movement, no wheezing, rhonchi or rales. Gastrointestinal. Abdomen flat, no organomegaly, non tender, no rebound or guarding Skin. No rashes Musculoskeletal: no joint deformities     Data Reviewed: I have personally reviewed following labs and imaging studies  CBC:  Recent Labs Lab 04/11/17 1659 04/11/17 1725 04/12/17 0912 04/14/17 1109 04/15/17 0526  WBC  --  8.7 5.4 7.5 8.7  NEUTROABS  --  5.5  --  4.8  --   HGB 12.9 10.4* 9.0* 9.3* 9.3*  HCT 38.0 34.9* 30.1* 30.1* 29.7*  MCV  --  69.7* 69.5* 67.5* 66.4*  PLT  --  311 300 338 761   Basic Metabolic Panel:  Recent Labs Lab 04/11/17 1725 04/12/17 0126 04/12/17 0912 04/13/17 0819 04/14/17 1109 04/15/17 0526  NA 135  --  135 137 138 134*  K 5.2*  --  5.0 4.0 3.6 3.7  CL 96*  --  99* 100* 98* 96*  CO2 27  --  28 28 31 31   GLUCOSE 98  --  148* 144* 109* 155*  BUN 50*  --  45* 17 7 7   CREATININE 2.41*  --  1.39* 0.72 0.62 0.77  CALCIUM 9.8  --  9.3 9.8 9.8 9.4  MG  --  2.0  --   --   --   --    GFR: Estimated Creatinine Clearance: 88.1 mL/min (by C-G formula based on SCr of 0.77 mg/dL). Liver Function Tests:  Recent Labs Lab 04/11/17 1725 04/12/17 0912  AST 51* 46*  ALT 25 25  ALKPHOS 104 97  BILITOT 0.3 0.5  PROT 7.9 7.6  ALBUMIN 4.1 3.7   No results for input(s): LIPASE, AMYLASE in the last 168 hours. No results for input(s): AMMONIA in the last 168 hours. Coagulation Profile:  Recent Labs Lab 04/08/17 1328 04/11/17 1725 04/13/17 0819 04/14/17 1109 04/15/17 0526   INR 1.7 3.45 3.26 2.75 2.47   Cardiac Enzymes:  Recent Labs Lab 04/11/17 1725 04/11/17 2135 04/12/17 0126 04/12/17 0912 04/13/17 0819 04/13/17 2158 04/14/17 1109  CKTOTAL 1,803*  --   --  1,177* 976*  --  587*  TROPONINI  --  <0.03 <0.03 <0.03  --  0.09* 0.08*   BNP (last 3 results) No results for input(s): PROBNP in the last 8760 hours. HbA1C: No results for input(s): HGBA1C in the last 72 hours. CBG:  Recent Labs Lab 04/11/17 1957  GLUCAP 85   Lipid Profile: No results for input(s): CHOL, HDL, LDLCALC, TRIG, CHOLHDL, LDLDIRECT in the last 72 hours. Thyroid Function Tests: No results for input(s): TSH, T4TOTAL, FREET4, T3FREE, THYROIDAB in the last 72 hours. Anemia Panel: No results for  input(s): VITAMINB12, FOLATE, FERRITIN, TIBC, IRON, RETICCTPCT in the last 72 hours.    Radiology Studies: I have reviewed all of the imaging during this hospital visit personally     Scheduled Meds: . ARIPiprazole  5 mg Oral Daily  . aspirin EC  81 mg Oral Daily  . benztropine  0.5 mg Oral QHS  . metoprolol tartrate  25 mg Oral BID  . mometasone-formoterol  2 puff Inhalation BID  . sertraline  200 mg Oral Daily  . warfarin  7.5 mg Oral ONCE-1800  . Warfarin - Pharmacist Dosing Inpatient   Does not apply q1800   Continuous Infusions: . acyclovir Stopped (04/15/17 0556)  . dextrose 5% lactated ringers 75 mL/hr at 04/14/17 2114     LOS: 4 days        Tawni Millers, MD [facility] Pager [phone]

## 2017-04-15 NOTE — Plan of Care (Signed)
Problem: Fluid Volume: Goal: Ability to maintain a balanced intake and output will improve Outcome: Progressing Iv fluids running with po intake

## 2017-04-15 NOTE — Plan of Care (Signed)
Problem: Education: Goal: Knowledge of the prescribed therapeutic regimen will improve Outcome: Progressing Teaching on IV fluids and medications given for shingles.

## 2017-04-15 NOTE — Evaluation (Signed)
Physical Therapy Evaluation Patient Details Name: Michele [name] MRN: [id number] DOB: 08/01/1950 Today's Date: 04/15/2017   History of Present Illness  Michele [name] is a 66 year old female who presented to the ED on 9/24 after a mechanical fall with a history of OSA, HTN, asthma, DM II, myelofibrosis, paroxysmal atrial fibrillation, CAD s/p stenting  Her daughter found her on the floor as she was unable to get up by herself. 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 While in the ED, she was found to have an acute kidney injury and rhabdomyolysis. She was started on IV fluids and admitted. 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   Clinical Impression  Pt admitted with above diagnosis. Pt currently with functional limitations due to the deficits listed below (see PT Problem List). Pt cognition is returning and she is able to follow single step commands consistently. Pt currently, minA for bed mobility, transfers and 2 feet ambulation to recliner. Pt will benefit from skilled PT to increase their independence and safety with mobility to allow discharge to the venue listed below.       Follow Up Recommendations Home health PT;Supervision/Assistance - 24 hour    Equipment Recommendations  Standard walker;3in1 (PT)    Recommendations for Other Services OT consult     Precautions / Restrictions Precautions Precautions: None Restrictions Weight Bearing Restrictions: No      Mobility  Bed Mobility Overal bed mobility: Needs Assistance Bed Mobility: Supine to Sit     Supine to sit: Min assist     General bed mobility comments: minA for trunk to upright and pad scoot to EoB  Transfers Overall transfer level: Needs assistance Equipment used: 1 person hand held assist Transfers: Sit to/from Stand Sit to Stand: Min assist;From elevated surface         General transfer comment: minA for  steadying in upright, good power up from bed   Ambulation/Gait Ambulation/Gait assistance: Min assist Ambulation Distance (Feet): 2 Feet Assistive device: 1 person hand held assist Gait Pattern/deviations: Step-to pattern;Decreased step length - right;Decreased step length - left;Shuffle Gait velocity: slowed Gait velocity interpretation: Below normal speed for age/gender General Gait Details: 6 slow, shuffling steps to recliner, minA for steadying with gait      Balance Overall balance assessment: Needs assistance Sitting-balance support: Feet supported Sitting balance-Leahy Scale: Fair     Standing balance support: Single extremity supported Standing balance-Leahy Scale: Fair Standing balance comment: min HHA for steadying in upright                              Pertinent Vitals/Pain Pain Assessment: 0-10 Pain Score: 2  Pain Location: shingles rash site on flank Pain Descriptors / Indicators: Radiating    Home Living Family/patient expects to be discharged to:: Private residence Living Arrangements: Children;Other relatives Available Help at Discharge: Personal care attendant (3-4 hrs per day) Type of Home: House Home Access: Stairs to enter Entrance Stairs-Rails: None (wall) Entrance Stairs-Number of Steps: 6 Home Layout: One level Home Equipment: Cane - single point;Shower seat - built in;Hand held shower head;Grab bars - tub/shower;Walker - standard      Prior Function Level of Independence: Needs assistance   Gait / Transfers Assistance Needed: walks with standard walker household distanced  ADL's / Homemaking Assistance Needed: health aid helps with bathing and grooming, daughter helps withiADLs  Extremity/Trunk Assessment   Upper Extremity Assessment Upper Extremity Assessment: Generalized weakness    Lower Extremity Assessment Lower Extremity Assessment: Generalized weakness       Communication   Communication: No  difficulties  Cognition Arousal/Alertness: Lethargic Behavior During Therapy: WFL for tasks assessed/performed Overall Cognitive Status: Impaired/Different from baseline Area of Impairment: Memory;Following commands;Awareness;Orientation                 Orientation Level: Disoriented to;Time   Memory: Decreased short-term memory Following Commands: Follows one step commands consistently   Awareness: Emergent   General Comments: per sister improving since admission      General Comments General comments (skin integrity, edema, etc.): Pt on 3L O2/min via nasal cannula SaO2 100%, BP 158/99, HR 72 bpm, RR 18bpm         Assessment/Plan    PT Assessment Patient needs continued PT services  PT Problem List Decreased strength;Decreased range of motion;Decreased activity tolerance;Decreased balance;Decreased mobility;Decreased cognition;Cardiopulmonary status limiting activity;Pain       PT Treatment Interventions DME instruction;Gait training;Stair training;Functional mobility training;Therapeutic activities;Therapeutic exercise;Balance training;Cognitive remediation;Patient/family education    PT Goals (Current goals can be found in the Care Plan section)  Acute Rehab PT Goals Patient Stated Goal: feel better PT Goal Formulation: With patient Time For Goal Achievement: 04/29/17 Potential to Achieve Goals: Fair    Frequency Min 3X/week    AM-PAC PT "6 Clicks" Daily Activity  Outcome Measure Difficulty turning over in bed (including adjusting bedclothes, sheets and blankets)?: Unable Difficulty moving from lying on back to sitting on the side of the bed? : Unable Difficulty sitting down on and standing up from a chair with arms (e.g., wheelchair, bedside commode, etc,.)?: Unable Help needed moving to and from a bed to chair (including a wheelchair)?: Total Help needed walking in hospital room?: Total Help needed climbing 3-5 steps with a railing? : Total 6 Click Score:  6    End of Session Equipment Utilized During Treatment: Gait belt;Oxygen Activity Tolerance: Patient limited by lethargy;Other (comment) (limited by nausea) Patient left: in chair;with call bell/phone within reach;with chair alarm set;with family/visitor present Nurse Communication: Mobility status PT Visit Diagnosis: Unsteadiness on feet (R26.81);Other abnormalities of gait and mobility (R26.89);Repeated falls (R29.6);Muscle weakness (generalized) (M62.81);Difficulty in walking, not elsewhere classified (R26.2);History of falling (Z91.81);Pain Pain - part of body:  (trunk from shingles)    Time: 1310-1353 PT Time Calculation (min) (ACUTE ONLY): 43 min   Charges:   PT Evaluation $PT Eval Low Complexity: 1 Low PT Treatments $Therapeutic Activity: 23-37 mins   PT G Codes:        Laronica Bhagat B. Migdalia Dk PT, DPT Acute Rehabilitation  [phone] Pager [phone]    [place] 04/15/2017, 2:56 PM

## 2017-04-15 NOTE — Progress Notes (Signed)
Nutrition Brief Note  RD consulted for assessment. Per discussion with patient and daughter(?) at bedside, patient had been eating well, but had an acute change and developed nausea overnight. Suspect this new nausea was reason for consult.   RD asked what sounded good to the patient. She could not mention anything. However, when RD listed foods, she was able to choose a couple.   She had no interest in her Lunch tray that was delivered to her.   RD spoke with dietary to order new lunch tray and place order for Dinner.   Pt has a Boost breeze at bedside. Her recent BGs have been 110-155. Given new nausea, will order this TID.    Burtis Junes RD, LDN, CNSC Clinical Nutrition Pager: [phone] 04/15/2017 2:01 PM

## 2017-04-15 NOTE — Progress Notes (Signed)
[facility] PROGRESS NOTE I saw and evaluated the patient, I personally obtain the key portions of the history and physical exam, I reviewed the physician assistant student documentation and agree with the physician assistant student medical decision-making. Further assessment and plan are as follows:  Please see attendings daily progress note.  Keisuke Hollabaugh M.D.  KESI PERROW ZOX:[id number] DOB: 31-Jul-1950 DOA: 04/11/2017 PCP: [name], MD  Brief Narrative Ms. Schill is a 66 year old female who presented to the ED on 9/24 after a mechanical fall with a history of OSA, HTN, asthma, DM II, myelofibrosis, paroxysmal atrial fibrillation, CAD s/p stenting  Her daughter found her on the floor as she was unable to get up by herself. 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 While in the ED, she was found to have an acute kidney injury and rhabdomyolysis. She was started on IV fluids and admitted. 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   Assessment/Plan: Principal Problem:   AKI (acute kidney injury) ([place]) Active Problems:   Obstructive sleep apnea   Essential hypertension   Coronary atherosclerosis   Asthma, mild intermittent   Diabetes mellitus, type II (HCC)   Anemia, iron deficiency   Paroxysmal A-fib (HCC)   AF (paroxysmal atrial fibrillation) (HCC)   Anticoagulated   Hyperkalemia   Rhabdomyolysis   ARF (acute renal failure) ([place])  1. Zoster Viral Encephalitis -Improving with IV acyclovir. Adequate attention and more organized thinking. -Delirium likely aggravated by hospitalization- improving. Continue supportive treatment.  -Continue home medications: aripiprazole,cogentin, sertraline -Consult with nutrition and PT.  2. Herpes Zoster -On right flank, improving. Changed to IV Acyclovir on 9/26 -Patient reporting nausea and increased pain- added IV Ketorolac and  Zofran  -Contacted Infection Control pertaining to contact precautions.  3. Rhabdomyolysis due to mechanical fall -CK improved from 1,803 on 9/24 to 587 by 9/27.  4. Acute kidney injury -Resolved. Cr today 0.77 from 2.60 on 9/24  5. Paroxysmal atrial fibrillation -INR 2.47 this morning. Pharmacy monitoring coumadin -Restarted on beta blocker.  6. HTN - Likely elevated due to pain. Continue current medications.   7. Asthma  -Stable at this time. Continue Dulera daily and Albuterol PRN  8. DM II -Continue diabetic diet.   9. CAD s/p stenting -Continue nitro PRN and Imdur for ongoing chest pain.   Code Status: FULL Family Communication: Sister at bedside. Disposition Plan: Once medically stable  Consultants:  None  Procedures:  None  Antibiotics:  None  HPI/Subjective: Patient's mentation is markedly improved today. Complaining of pain in her back and on her side where she has shingles. Nausea leading to poor appetite this morning. Didn't sleep well last night, so is very somnolent today.   Objective: Vitals:   04/14/17 2318 04/15/17 0430  BP: (!) 157/91 (!) 169/98  Pulse: (!) 101 86  Resp: 18 20  Temp: 99.2 F (37.3 C) 98.4 F (36.9 C)  SpO2: 98% 99%    Intake/Output Summary (Last 24 hours) at 04/15/17 0920 Last data filed at 04/15/17 0700  Gross per 24 hour  Intake             2825 ml  Output             2100 ml  Net              725 ml   Filed Weights   04/13/17 0248 04/14/17 0610 04/15/17 0430  Weight:  127.6 kg (281 lb 6.4 oz) 128.8 kg (284 lb) 126.6 kg (279 lb)   Exam:  General:  No acute distress. Laying very uncomfortably in bed and appears very tired.   Cardiovascular: Regular rate and rhythm. No murmurs/rubs/gallops. 2+ peripheral pulses.   Respiratory: Clear to auscultation bilaterally. No increased work of breathing  Abdomen: Soft, non distended, tenderness over right flank where rash is.   Skin: right flank ulcerative rash,  does not cross dermatome, mostly crusted with no erythema. Slowly improving.   Musculoskeletal: No obvious deformity.   Data Reviewed: Basic Metabolic Panel:  Recent Labs Lab 04/11/17 1725 04/12/17 0126 04/12/17 0912 04/13/17 0819 04/14/17 1109 04/15/17 0526  NA 135  --  135 137 138 134*  K 5.2*  --  5.0 4.0 3.6 3.7  CL 96*  --  99* 100* 98* 96*  CO2 27  --  28 28 31 31   GLUCOSE 98  --  148* 144* 109* 155*  BUN 50*  --  45* 17 7 7   CREATININE 2.41*  --  1.39* 0.72 0.62 0.77  CALCIUM 9.8  --  9.3 9.8 9.8 9.4  MG  --  2.0  --   --   --   --    Liver Function Tests:  Recent Labs Lab 04/11/17 1725 04/12/17 0912  AST 51* 46*  ALT 25 25  ALKPHOS 104 97  BILITOT 0.3 0.5  PROT 7.9 7.6  ALBUMIN 4.1 3.7   CBC:  Recent Labs Lab 04/11/17 1659 04/11/17 1725 04/12/17 0912 04/14/17 1109 04/15/17 0526  WBC  --  8.7 5.4 7.5 8.7  NEUTROABS  --  5.5  --  4.8  --   HGB 12.9 10.4* 9.0* 9.3* 9.3*  HCT 38.0 34.9* 30.1* 30.1* 29.7*  MCV  --  69.7* 69.5* 67.5* 66.4*  PLT  --  311 300 338 366   Cardiac Enzymes:  Recent Labs Lab 04/11/17 1725 04/11/17 2135 04/12/17 0126 04/12/17 0912 04/13/17 0819 04/13/17 2158 04/14/17 1109  CKTOTAL 1,803*  --   --  1,177* 976*  --  587*  TROPONINI  --  <0.03 <0.03 <0.03  --  0.09* 0.08*   CBG:  Recent Labs Lab 04/11/17 1957  GLUCAP 85   Scheduled Meds: . ARIPiprazole  5 mg Oral Daily  . aspirin EC  81 mg Oral Daily  . benztropine  0.5 mg Oral QHS  . metoprolol tartrate  25 mg Oral BID  . mometasone-formoterol  2 puff Inhalation BID  . sertraline  200 mg Oral Daily  . warfarin  7.5 mg Oral ONCE-1800  . Warfarin - Pharmacist Dosing Inpatient   Does not apply q1800   Continuous Infusions: . acyclovir Stopped (04/15/17 0556)  . dextrose 5% lactated ringers 75 mL/hr at 04/14/17 2114   Time spent: 25 minutes  Minda Ditto, PA-S  [facility] If 7PM-7AM, please contact night-coverage at [email or web address], password  [facility] 04/15/2017, 9:20 AM  LOS: 4 days

## 2017-04-16 LAB — CBC WITH DIFFERENTIAL/PLATELET
BASOS ABS: 0 10*3/uL (ref 0.0–0.1)
Basophils Relative: 0 %
EOS ABS: 0.1 10*3/uL (ref 0.0–0.7)
EOS PCT: 1 %
HCT: 32.2 % — ABNORMAL LOW (ref 36.0–46.0)
Hemoglobin: 9.9 g/dL — ABNORMAL LOW (ref 12.0–15.0)
LYMPHS ABS: 2.3 10*3/uL (ref 0.7–4.0)
Lymphocytes Relative: 22 %
MCH: 20.5 pg — ABNORMAL LOW (ref 26.0–34.0)
MCHC: 30.7 g/dL (ref 30.0–36.0)
MCV: 66.5 fL — ABNORMAL LOW (ref 78.0–100.0)
MONO ABS: 1.1 10*3/uL — AB (ref 0.1–1.0)
Monocytes Relative: 11 %
NEUTROS PCT: 66 %
Neutro Abs: 6.9 10*3/uL (ref 1.7–7.7)
PLATELETS: 374 10*3/uL (ref 150–400)
RBC: 4.84 MIL/uL (ref 3.87–5.11)
RDW: 16.6 % — AB (ref 11.5–15.5)
WBC: 10.4 10*3/uL (ref 4.0–10.5)

## 2017-04-16 LAB — BASIC METABOLIC PANEL
ANION GAP: 8 (ref 5–15)
BUN: 11 mg/dL (ref 6–20)
CALCIUM: 9.3 mg/dL (ref 8.9–10.3)
CO2: 30 mmol/L (ref 22–32)
Chloride: 96 mmol/L — ABNORMAL LOW (ref 101–111)
Creatinine, Ser: 0.99 mg/dL (ref 0.44–1.00)
GFR, EST NON AFRICAN AMERICAN: 58 mL/min — AB (ref >60)
GLUCOSE: 107 mg/dL — AB (ref 65–99)
POTASSIUM: 4.1 mmol/L (ref 3.5–5.1)
SODIUM: 134 mmol/L — AB (ref 135–145)

## 2017-04-16 LAB — PROTIME-INR
INR: 2.44
Prothrombin Time: 26.3 seconds — ABNORMAL HIGH (ref 11.4–15.2)

## 2017-04-16 MED ORDER — WARFARIN SODIUM 7.5 MG PO TABS
7.5000 mg | ORAL_TABLET | Freq: Once | ORAL | Status: AC
  Administered 2017-04-16: 7.5 mg via ORAL
  Filled 2017-04-16: qty 1

## 2017-04-16 NOTE — Progress Notes (Addendum)
Pt is alert and oriented this time, vitals stable, IV fluid and antiviral continue for shingles, Airborne precautions continue, daughter is in bed side and concerned about her Mother. RN answered her all of the questions bothering her. Shingles are crusted and dry on right back side and little woozy under right breast folds, RN put some foam over there, will continue to monitor

## 2017-04-16 NOTE — Progress Notes (Signed)
PROGRESS NOTE    Michele [name]  STM:[id number] DOB: September 06, 1950 DOA: 04/11/2017 PCP: [name], MD    Brief Narrative:  66 year old female presented after a fall. Patient does have the past medical history of paroxysmal atrial dilation, coronary disease status post stenting, sleep apnea, type 2 diabetes mellitus, chronic anemia and hypertension. The morning of the day of admission, patient was found on the floor, apparently she fell the night before and was unable to stand back on her feet. Recent diagnosis of herpes zoster. On initial physical examination blood pressure 108/59, 98/55, heart rate 59, 54, respiratory 13, 14, oxygen saturation 100%. Lungs were clear to auscultation bilaterally, heart S1-S2 present and rhythmic, abdomen was soft nontender, no lower extremity edema, neurologically nonfocal. Sodium 135, potassium 5.2, chloride 96, bicarbonate 27, glucose 98, BUN 50, creatinine 2.41, total CPK 1803, white count 8.7, hemoglobin 13.4, hematocrit 34.9, platelets 311, INR 3.4, urinalysis negative for infection. Head CT with no acte findings, chronic small vessel white matter ischemic changes and remote left basal ganglia infarct. Chest x-ray with increase in lung markings bilaterally, hilar congestion. EKG normal sinus rhythm, normal axis, normal intervals.  Patient admitted to the hospital with working diagnosis of rhabdomyolysis due to trauma complicated by acute kidney injury. 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 Clinically encephalopathy continue to improved,  more coherent, no agitation. Continue with IV acyclovir for a total of 14 days. Continue neuro checks and physical therapy evaluation. Pain control with ketorolac, follow nutrition recommendations, physical therapy evaluation and out of bed as tolerated. Will consult wound care to evaluate ulcerated lesion under the right breast. Continue ariprazole, benztropin, and sertraline for now.   3. Acute kidney injury with rhabdomyolysis. Resolved, renal function preserved with serum cr at 0,99 with K at 4,1 and Na at 134. Will continue hydration with dextrose and lactate ringers at 50 cc per hour.    4. Paroxysmal atrial fibrillation. Tolerating well metoprolol 25 mg po bid. Will continue telemetry monitoring for now. Continue warfarin for anticoagulation, follow INR per pharmacy protocol.   5. Obstructive sleep apnea. Continue supplemental 02 per [place] as needed, now tolerating well cpap.   6. Hypertension. Blood pressure systolic continue to be stable at 160 to 170.  7. Herpes zoster.  Tolerating well ketorolac for pain control will hold on morphine for now. Will wait for rash to have crusted an all lesions before discontinuing isolation and contact precautions. Follow up with wound care recommendations.   DVT prophylaxis:warfarin Code Status:full Family Communication:I spoke with patient's sister at the beside and all questions were addressed.  Disposition Plan:home    Consultants:    Procedures:    Antimicrobials:   IV acyclovir #3/14   Subjective: Continue to improve mentation, improved appetite and side pain, mild nausea but no vomiting. More orientated and much less confused.   Objective: Vitals:   04/15/17 2035  04/16/17  0304 04/16/17 0637 04/16/17 0807  BP: (!) 165/98 (!) 170/83 (!) 156/96   Pulse: 79 82 81   Resp: 18  18   Temp:   98.1 F (36.7 C)   TempSrc:   Axillary   SpO2: 98% 95% 96% 97%  Weight:   127.1 kg (280 lb 3.2 oz)   Height:        Intake/Output Summary (Last 24 hours) at 04/16/17 1029 Last data filed at 04/16/17 0657  Gross per 24 hour  Intake          2321.17 ml  Output             1350 ml  Net           971.17 ml   Filed Weights   04/14/17 0610 04/15/17 0430 04/16/17 0637  Weight: 128.8 kg (284 lb) 126.6 kg (279 lb) 127.1 kg (280 lb 3.2 oz)    Examination:  General: Not in pain or dyspnea, deconditioned Neurology: Awake and alert, non focal  E ENT: mild  pallor, no icterus, oral mucosa moist Cardiovascular: No JVD. S1-S2 present, rhythmic, no gallops, rubs, or murmurs. No lower extremity edema. Pulmonary:  breath sounds bilaterally decreased bilaterly, adequate air movement, no wheezing, rhonchi or rales. Gastrointestinal. Abdomen flat, no organomegaly, non tender, no rebound or guarding Skin. No rashes Musculoskeletal: no joint deformities     Data Reviewed: I have personally reviewed following labs and imaging studies  CBC:  Recent Labs Lab 04/11/17 1725 04/12/17 0912 04/14/17 1109 04/15/17 0526 04/16/17 0509  WBC 8.7 5.4 7.5 8.7 10.4  NEUTROABS 5.5  --  4.8  --  6.9  HGB 10.4* 9.0* 9.3* 9.3* 9.9*  HCT 34.9* 30.1* 30.1* 29.7* 32.2*  MCV 69.7* 69.5* 67.5* 66.4* 66.5*  PLT 311 300 338 366 650   Basic Metabolic Panel:  Recent Labs Lab 04/12/17 0126 04/12/17 0912 04/13/17 0819 04/14/17 1109 04/15/17 0526 04/16/17 0509  NA  --  135 137 138 134* 134*  K  --  5.0 4.0 3.6 3.7 4.1  CL  --  99* 100* 98* 96* 96*  CO2  --  28 28 31 31 30   GLUCOSE  --  148* 144* 109* 155* 107*  BUN  --  45* 17 7 7 11   CREATININE  --  1.39* 0.72 0.62 0.77 0.99  CALCIUM  --  9.3 9.8 9.8 9.4 9.3  MG 2.0  --   --   --   --   --    GFR: Estimated Creatinine Clearance:  71.4 mL/min (by C-G formula based on SCr of 0.99 mg/dL). Liver Function Tests:  Recent Labs Lab 04/11/17 1725 04/12/17 0912  AST 51* 46*  ALT 25 25  ALKPHOS 104 97  BILITOT 0.3 0.5  PROT 7.9 7.6  ALBUMIN 4.1 3.7   No results for input(s): LIPASE, AMYLASE in the last 168 hours. No results for input(s): AMMONIA in the last 168 hours. Coagulation Profile:  Recent Labs Lab 04/11/17 1725 04/13/17 0819 04/14/17 1109 04/15/17 0526 04/16/17 0509  INR 3.45 3.26 2.75 2.47 2.44   Cardiac Enzymes:  Recent Labs Lab 04/11/17 1725 04/11/17 2135 04/12/17 0126 04/12/17 0912 04/13/17 0819 04/13/17 2158 04/14/17 1109  CKTOTAL 1,803*  --   --  1,177* 976*  --  587*  TROPONINI  --  <0.03 <0.03 <0.03  --  0.09* 0.08*   BNP (last 3 results) No results for input(s): PROBNP in the last 8760 hours. HbA1C: No results  for input(s): HGBA1C in the last 72 hours. CBG:  Recent Labs Lab 04/11/17 1957  GLUCAP 85   Lipid Profile: No results for input(s): CHOL, HDL, LDLCALC, TRIG, CHOLHDL, LDLDIRECT in the last 72 hours. Thyroid Function Tests: No results for input(s): TSH, T4TOTAL, FREET4, T3FREE, THYROIDAB in the last 72 hours. Anemia Panel: No results for input(s): VITAMINB12, FOLATE, FERRITIN, TIBC, IRON, RETICCTPCT in the last 72 hours.    Radiology Studies: I have reviewed all of the imaging during this hospital visit personally     Scheduled Meds: . ARIPiprazole  5 mg Oral Daily  . aspirin EC  81 mg Oral Daily  . benztropine  0.5 mg Oral QHS  . feeding supplement  1 Container Oral TID BM  . metoprolol tartrate  25 mg Oral BID  . mometasone-formoterol  2 puff Inhalation BID  . sertraline  200 mg Oral Daily  . Warfarin - Pharmacist Dosing Inpatient   Does not apply q1800   Continuous Infusions: . acyclovir Stopped (04/16/17 0800)  . dextrose 5% lactated ringers 50 mL/hr at 04/15/17 1308     LOS: 5 days        Feliciano Wynter Gerome Apley, MD [facility] Pager [phone]

## 2017-04-16 NOTE — Progress Notes (Signed)
ANTICOAGULATION CONSULT NOTE - Follow Up Consult  Pharmacy Consult for Coumadin Indication: atrial fibrillation  Allergies  Allergen Reactions  . Meprobamate Nausea And Vomiting    Patient Measurements: Height: 5\' 2"  (157.5 cm) Weight: 280 lb 3.2 oz (127.1 kg) IBW/kg (Calculated) : 50.1   Vital Signs: Temp: 98.7 F (37.1 C) (09/29 1217) Temp Source: Oral (09/29 1217) BP: 159/82 (09/29 1217) Pulse Rate: 88 (09/29 1217)  Labs:  Recent Labs  04/13/17 2158  04/14/17 1109 04/15/17 0526 04/16/17 0509  HGB  --   < > 9.3* 9.3* 9.9*  HCT  --   --  30.1* 29.7* 32.2*  PLT  --   --  338 366 374  LABPROT  --   --  28.8* 26.5* 26.3*  INR  --   --  2.75 2.47 2.44  CREATININE  --   --  0.62 0.77 0.99  CKTOTAL  --   --  587*  --   --   TROPONINI 0.09*  --  0.08*  --   --   < > = values in this interval not displayed.  Estimated Creatinine Clearance: 71.4 mL/min (by C-G formula based on SCr of 0.99 mg/dL).   Assessment:  Anticoag: PAF on Warfarin. INR 2.44. CBC stable. PTA: 11.25 daily except 7.5 mg MF as of anti-coag visit on 9/21, INR 3.45 on admit   Goal of Therapy:  INR 2-3 Monitor platelets by anticoagulation protocol: Yes   Plan:  Repeat Coumadin 7.5mg  po x 1 tonight Daily INR  Michele [name], PharmD, BCPS Clinical Staff Pharmacist Pager [phone]  Michele [name] 04/16/2017,12:43 PM

## 2017-04-16 NOTE — Progress Notes (Signed)
Pt's care started from 3:00pm

## 2017-04-16 NOTE — Progress Notes (Signed)
RN saw patient on Non-Tele, Double verified with MD via text paging as pt is in Metoprolol and h/o A-fib, does she really need to be off monitor, waiting for the response

## 2017-04-17 LAB — CBC
HEMATOCRIT: 32.6 % — AB (ref 36.0–46.0)
HEMOGLOBIN: 10 g/dL — AB (ref 12.0–15.0)
MCH: 20.6 pg — ABNORMAL LOW (ref 26.0–34.0)
MCHC: 30.7 g/dL (ref 30.0–36.0)
MCV: 67.1 fL — ABNORMAL LOW (ref 78.0–100.0)
Platelets: 366 10*3/uL (ref 150–400)
RBC: 4.86 MIL/uL (ref 3.87–5.11)
RDW: 16.8 % — ABNORMAL HIGH (ref 11.5–15.5)
WBC: 8.3 10*3/uL (ref 4.0–10.5)

## 2017-04-17 LAB — BASIC METABOLIC PANEL
ANION GAP: 8 (ref 5–15)
BUN: 12 mg/dL (ref 6–20)
CHLORIDE: 98 mmol/L — AB (ref 101–111)
CO2: 29 mmol/L (ref 22–32)
CREATININE: 0.94 mg/dL (ref 0.44–1.00)
Calcium: 9.3 mg/dL (ref 8.9–10.3)
GFR calc non Af Amer: >60 mL/min (ref >60)
Glucose, Bld: 122 mg/dL — ABNORMAL HIGH (ref 65–99)
POTASSIUM: 4 mmol/L (ref 3.5–5.1)
SODIUM: 135 mmol/L (ref 135–145)

## 2017-04-17 LAB — PROTIME-INR
INR: 2.57
PROTHROMBIN TIME: 27.4 s — AB (ref 11.4–15.2)

## 2017-04-17 MED ORDER — WARFARIN SODIUM 7.5 MG PO TABS
7.5000 mg | ORAL_TABLET | Freq: Once | ORAL | Status: AC
  Administered 2017-04-17: 7.5 mg via ORAL
  Filled 2017-04-17: qty 1

## 2017-04-17 NOTE — Progress Notes (Addendum)
PROGRESS NOTE    Michele [name]  IOM:[id number] DOB: [birth year] DOA: 04/11/2017 PCP: [name], MD    Brief Narrative:  66 year old female presented after a fall. Patient does have the past medical history of paroxysmal atrial dilation, coronary disease status post stenting, sleep apnea, type 2 diabetes mellitus, chronic anemia and hypertension. The morning of the day of admission, patient was found on the floor, apparently she fell the night before and was unable to stand back on her feet. Recent diagnosis of herpes zoster. On initial physical examination blood pressure 108/59, 98/55, heart rate 59, 54, respiratory 13, 14, oxygen saturation 100%. Lungs were clear to auscultation bilaterally, heart S1-S2 present and rhythmic, abdomen was soft nontender, no lower extremity edema, neurologically nonfocal. Sodium 135, potassium 5.2, chloride 96, bicarbonate 27, glucose 98, BUN 50, creatinine 2.41, total CPK 1803, white count 8.7, hemoglobin 13.4, hematocrit 34.9, platelets 311, INR 3.4, urinalysis negative for infection. Head CT with no acte findings, chronic small vessel white matter ischemic changes and remote left basal ganglia infarct. Chest x-ray with increase in lung markings bilaterally, hilar congestion. EKG normal sinus rhythm, normal axis, normal intervals.  Patient admitted to the hospital with working diagnosis of rhabdomyolysis due to trauma complicated by acute kidney injury. 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 Persistent improvement in patient's mentation, out of bed to the chair with help of physical therapy. Will plan to continue with IV acyclovir for a total of 14 days. Neuro checks and physical therapy evaluation. Pain control with ketorolac, avoid narcotics. Continue ariprazole, benztropin, and sertraline per her home regimen.   2. Paroxysmal atrial fibrillation. Continue metoprolol 25 mg po bid. Off telemetry. Continue warfarin for anticoagulation, follow INR per pharmacy protocol, INR at 2,57.   3. Obstructive sleep apnea. Continue cpap at night with good toleration, plus supplemental 02 per [place] as needed,   4. Hypertension. Systolic blood pressure continue to be stable at 150 to 160. Continue low dose metoprolol.   5. Herpes zoster.  Continue pain control, and local wound care, not all lesions have crusted.    6. Acute kidney injury with rhabdomyolysis/ resolved.  Improved po intake, will follow a restrictive IV fluids strategy and will discontinue IV fluids. Continue to encourage po intake.    7. Morbid obesity with a BMI of 51.   DVT prophylaxis:warfarin Code Status:full Family Communication:I spoke with patient's sister at the beside and all questions were addressed.  Disposition Plan:home    Consultants:    Procedures:    Antimicrobials:   IV acyclovir #4/14  Subjective: Patient able to sleep well, out of bed to the chair with physical therapy, continue to have low apatite, mild nausea but no vomiting, pain at the zoster rash is controlled with analgesics.    Objective: Vitals:   04/16/17 2157 04/17/17 0045 04/17/17 0627 04/17/17 0707  BP: Marland Kitchen)  167/88 (!) 166/86 (!) 159/89   Pulse: 87 88 79   Resp: 20 20 20    Temp: 98.5 F (36.9 C)  98.9 F (37.2  C)   TempSrc: Axillary  Oral   SpO2: 99% 100% 93%   Weight:    126.5 kg (278 lb 12.8 oz)  Height:        Intake/Output Summary (Last 24 hours) at 04/17/17 0953 Last data filed at 04/17/17 4098  Gross per 24 hour  Intake             2387 ml  Output             2000 ml  Net              387 ml   Filed Weights   04/15/17 0430 04/16/17 0637 04/17/17 0707  Weight: 126.6 kg (279 lb) 127.1 kg (280 lb 3.2 oz) 126.5 kg (278 lb 12.8 oz)    Examination:  General: Not in pain or dyspnea, deconditioned Neurology: Awake and alert, non focal  E ENT: mild pallor, no icterus, oral mucosa moist Cardiovascular: No JVD. S1-S2 present, rhythmic, no gallops, rubs, or murmurs. No lower extremity edema. Pulmonary: vesicular breath sounds bilaterally, adequate air movement, no wheezing, rhonchi or rales. Gastrointestinal. Abdomen flat, no organomegaly, non tender, no rebound or guarding Skin. Zoster lesion with open wounds in the back and front, side ulcers have crusted.  Musculoskeletal: no joint deformities     Data Reviewed: I have personally reviewed following labs and imaging studies  CBC:  Recent Labs Lab 04/11/17 1725 04/12/17 0912 04/14/17 1109 04/15/17 0526 04/16/17 0509 04/17/17 0420  WBC 8.7 5.4 7.5 8.7 10.4 8.3  NEUTROABS 5.5  --  4.8  --  6.9  --   HGB 10.4* 9.0* 9.3* 9.3* 9.9* 10.0*  HCT 34.9* 30.1* 30.1* 29.7* 32.2* 32.6*  MCV 69.7* 69.5* 67.5* 66.4* 66.5* 67.1*  PLT 311 300 338 366 374 119   Basic Metabolic Panel:  Recent Labs Lab 04/12/17 0126  04/13/17 0819 04/14/17 1109 04/15/17 0526 04/16/17 0509 04/17/17 0420  NA  --   < > 137 138 134* 134* 135  K  --   < > 4.0 3.6 3.7 4.1 4.0  CL  --   < > 100* 98* 96* 96* 98*  CO2  --   < > 28 31 31 30 29   GLUCOSE  --   < > 144* 109* 155* 107* 122*  BUN  --   < > 17 7 7 11 12   CREATININE  --   < > 0.72 0.62 0.77 0.99 0.94  CALCIUM  --   < > 9.8 9.8 9.4 9.3 9.3  MG 2.0  --   --   --   --   --   --   < > = values in  this interval not displayed. GFR: Estimated Creatinine Clearance: 75 mL/min (by C-G formula based on SCr of 0.94 mg/dL). Liver Function Tests:  Recent Labs Lab 04/11/17 1725 04/12/17 0912  AST 51* 46*  ALT 25 25  ALKPHOS 104 97  BILITOT 0.3 0.5  PROT 7.9 7.6  ALBUMIN 4.1 3.7   No results for input(s): LIPASE, AMYLASE in the last 168 hours. No results for input(s): AMMONIA in the last 168 hours. Coagulation Profile:  Recent Labs Lab 04/13/17 0819 04/14/17 1109 04/15/17 0526 04/16/17 0509 04/17/17 0420  INR 3.26 2.75 2.47 2.44 2.57   Cardiac Enzymes:  Recent Labs Lab 04/11/17 1725 04/11/17  2135 04/12/17 0126 04/12/17 0912 04/13/17 0819 04/13/17 2158 04/14/17 1109  CKTOTAL 1,803*  --   --  1,177* 976*  --  587*  TROPONINI  --  <0.03 <0.03 <0.03  --  0.09* 0.08*   BNP (last 3 results) No results for input(s): PROBNP in the last 8760 hours. HbA1C: No results for input(s): HGBA1C in the last 72 hours. CBG:  Recent Labs Lab 04/11/17 1957  GLUCAP 85   Lipid Profile: No results for input(s): CHOL, HDL, LDLCALC, TRIG, CHOLHDL, LDLDIRECT in the last 72 hours. Thyroid Function Tests: No results for input(s): TSH, T4TOTAL, FREET4, T3FREE, THYROIDAB in the last 72 hours. Anemia Panel: No results for input(s): VITAMINB12, FOLATE, FERRITIN, TIBC, IRON, RETICCTPCT in the last 72 hours.    Radiology Studies: I have reviewed all of the imaging during this hospital visit personally     Scheduled Meds: . ARIPiprazole  5 mg Oral Daily  . benztropine  0.5 mg Oral QHS  . feeding supplement  1 Container Oral TID BM  . metoprolol tartrate  25 mg Oral BID  . mometasone-formoterol  2 puff Inhalation BID  . sertraline  200 mg Oral Daily  . Warfarin - Pharmacist Dosing Inpatient   Does not apply q1800   Continuous Infusions: . acyclovir Stopped (04/17/17 0850)  . dextrose 5% lactated ringers 50 mL/hr at 04/15/17 1308     LOS: 6 days        Hollie Wojahn Gerome Apley, MD [facility] Pager [phone]

## 2017-04-17 NOTE — Discharge Instructions (Signed)

## 2017-04-17 NOTE — Progress Notes (Signed)
Pt is alert and oriented this time, vitals stable, IV fluid and antiviral continue for shingles, Airborne precautions continue, Family member is in bed side and concerned. RN addresses her all of the questions bothering her. Shingles are crusted and dry on right back side and little woozy under right breast folds, RN put some foam over there, will continue to monitor

## 2017-04-17 NOTE — Progress Notes (Addendum)
Placed patient on CPAP for HS via FFM, previous settings ( auto titrate max 20.0, min 5.0) cm H20 with 2 lpm Pm O2 bleed in. Chamber filled with distilled H20 for humidity.  Tolerating well at this time.

## 2017-04-17 NOTE — Progress Notes (Signed)
ANTICOAGULATION CONSULT NOTE - Follow Up Consult  Pharmacy Consult for Coumadin Indication: atrial fibrillation  Allergies  Allergen Reactions  . Meprobamate Nausea And Vomiting    Patient Measurements: Height: 5\' 2"  (157.5 cm) Weight: 278 lb 12.8 oz (126.5 kg) IBW/kg (Calculated) : 50.1   Vital Signs: Temp: 98.5 F (36.9 C) (09/30 1210) Temp Source: Oral (09/30 1210) BP: 118/92 (09/30 1210) Pulse Rate: 78 (09/30 1210)  Labs:  Recent Labs  04/15/17 0526 04/16/17 0509 04/17/17 0420  HGB 9.3* 9.9* 10.0*  HCT 29.7* 32.2* 32.6*  PLT 366 374 366  LABPROT 26.5* 26.3* 27.4*  INR 2.47 2.44 2.57  CREATININE 0.77 0.99 0.94    Estimated Creatinine Clearance: 75 mL/min (by C-G formula based on SCr of 0.94 mg/dL).   Assessment:   Anticoag: PAF on Warfarin. INR 2.57. CBC stable. PTA: 11.25 daily except 7.5 mg MF as of anti-coag visit on 9/21, INR 3.45 on admit   Goal of Therapy:  INR 2-3 Monitor platelets by anticoagulation protocol: Yes   Plan:  Repeat Coumadin 7.5mg  po x 1 tonight Daily INR  Hayli Milligan S. Alford Highland, PharmD, BCPS Clinical Staff Pharmacist Pager [phone]  Eilene Ghazi Stillinger 04/17/2017,12:50 PM

## 2017-04-18 LAB — CBC
HCT: 37.1 % (ref 36.0–46.0)
HEMOGLOBIN: 11.3 g/dL — AB (ref 12.0–15.0)
MCH: 20.5 pg — ABNORMAL LOW (ref 26.0–34.0)
MCHC: 30.5 g/dL (ref 30.0–36.0)
MCV: 67.3 fL — ABNORMAL LOW (ref 78.0–100.0)
Platelets: 312 10*3/uL (ref 150–400)
RBC: 5.51 MIL/uL — ABNORMAL HIGH (ref 3.87–5.11)
RDW: 16.9 % — ABNORMAL HIGH (ref 11.5–15.5)
WBC: 8.2 10*3/uL (ref 4.0–10.5)

## 2017-04-18 LAB — PROTIME-INR
INR: 2.55
Prothrombin Time: 27.2 seconds — ABNORMAL HIGH (ref 11.4–15.2)

## 2017-04-18 MED ORDER — SODIUM CHLORIDE 0.9% FLUSH
10.0000 mL | INTRAVENOUS | Status: DC | PRN
  Administered 2017-04-19: 20 mL
  Administered 2017-04-21: 10 mL
  Filled 2017-04-18 (×2): qty 40

## 2017-04-18 MED ORDER — WARFARIN SODIUM 7.5 MG PO TABS
7.5000 mg | ORAL_TABLET | Freq: Once | ORAL | Status: AC
  Administered 2017-04-18: 7.5 mg via ORAL
  Filled 2017-04-18: qty 1

## 2017-04-18 MED ORDER — HYDROCHLOROTHIAZIDE 12.5 MG PO CAPS
12.5000 mg | ORAL_CAPSULE | Freq: Every day | ORAL | Status: DC
  Administered 2017-04-18 – 2017-04-21 (×4): 12.5 mg via ORAL
  Filled 2017-04-18 (×4): qty 1

## 2017-04-18 NOTE — Progress Notes (Signed)
PROGRESS NOTE    Michele [name]  INO:[id number] DOB: 08/13/1950 DOA: 04/11/2017 PCP: [name], MD    Brief Narrative:  66 year old female presented after a fall. Patient does have the past medical history of paroxysmal atrial dilation, coronary disease status post stenting, sleep apnea, type 2 diabetes mellitus, chronic anemia and hypertension. The morning of the day of admission, patient was found on the floor, apparently she fell the night before and was unable to stand back on her feet. Recent diagnosis of herpes zoster. On initial physical examination blood pressure 108/59, 98/55, heart rate 59, 54, respiratory 13, 14, oxygen saturation 100%. Lungs were clear to auscultation bilaterally, heart S1-S2 present and rhythmic, abdomen was soft nontender, no lower extremity edema, neurologically nonfocal. Sodium 135, potassium 5.2, chloride 96, bicarbonate 27, glucose 98, BUN 50, creatinine 2.41, total CPK 1803, white count 8.7, hemoglobin 13.4, hematocrit 34.9, platelets 311, INR 3.4, urinalysis negative for infection. Head CT with no acte findings, chronic small vessel white matter ischemic changes and remote left basal ganglia infarct. Chest x-ray with increase in lung markings bilaterally, hilar congestion. EKG normal sinus rhythm, normal axis, normal intervals.  Patient admitted to the hospital with working diagnosis of rhabdomyolysis due to trauma complicated by acute kidney injury. 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 Mentation close to baseline, will continue IV acyclovir for a total of 14 days, will order to place picc line and will arrange for home antibiotic therapy. Will Development worker, international aid. Improved appetite. Tolerating well ariprazole, benztropin, and sertraline. No agitation or disorientation.    2. Paroxysmal atrial fibrillation. Tolerating low dose of metoprolol 25 mg po bid. Continue anticoagulation with warfarin with INR at 2,55.   3. Obstructive sleep apnea.  Cpap at night  plus supplemental 02 per [place] as needed,   4. Hypertension. Systolic blood pressure 096 to 170, will continue metoprolol and will resume diuretic therapy with HCTZ. Patient off IV fluids.   5. Herpes zoster. Pain control, with reasonable control, continue local wound care. Persistent open ulcerated lesions on the right flank and under the right breast. No associated erythema or purulence.     6. Acute kidney injury with rhabdomyolysis/ resolved. Will avoid nephrotoxic medications, resume hctx for better blood pressure control. Avoid hypotension.    7. Morbid obesity with a BMI of 51.   DVT prophylaxis:warfarin Code Status:full Family Communication:I spoke with patient's sister at the beside and all questions were addressed.  Disposition Plan:home    Consultants:    Procedures:    Antimicrobials:   IV acyclovir #4/14   Subjective: Patient feeling better, out of bed to the chair, improved strength and mentation. Improved appetite.   Objective: Vitals:   04/17/17 1949 04/17/17 2004 04/18/17 0635 04/18/17 0639  BP: (!) 177/89  (!) 167/96   Pulse: 88  80   Resp: 20  20   Temp:  99 F (37.2 C)  98.5 F (36.9 C)   TempSrc: Oral  Axillary   SpO2: 97% 95% 97%   Weight:    121.3 kg (267 lb 8 oz)    Height:        Intake/Output Summary (Last 24 hours) at 04/18/17 1240 Last data filed at 04/18/17 0636  Gross per 24 hour  Intake             1267 ml  Output             2500 ml  Net            -1233 ml   Filed Weights   04/16/17 0637 04/17/17 0707 04/18/17 0639  Weight: 127.1 kg (280 lb 3.2 oz) 126.5 kg (278 lb 12.8 oz) 121.3 kg (267 lb 8 oz)    Examination:  General: Not in pain or dyspnea, deconditioned Neurology: Awake and alert, non focal, adequate attention and organized thinking.   E ENT: mild pallor, no icterus, oral mucosa moist Cardiovascular: No JVD. S1-S2 present, rhythmic, no gallops, rubs, or murmurs. No lower extremity edema. Pulmonary: vesicular breath sounds bilaterally, adequate air movement, no wheezing, rhonchi or rales. Gastrointestinal. Abdomen flat, no organomegaly, non tender, no rebound or guarding Skin. Open ulcerated wounds at the right flank and under the right breast. No erythema or purulence.  Musculoskeletal: no joint deformities     Data Reviewed: I have personally reviewed following labs and imaging studies  CBC:  Recent Labs Lab 04/11/17 1725  04/14/17 1109 04/15/17 0526 04/16/17 0509 04/17/17 0420 04/18/17 0703  WBC 8.7  < > 7.5 8.7 10.4 8.3 8.2  NEUTROABS 5.5  --  4.8  --  6.9  --   --   HGB 10.4*  < > 9.3* 9.3* 9.9* 10.0* 11.3*  HCT 34.9*  < > 30.1* 29.7* 32.2* 32.6* 37.1  MCV 69.7*  < > 67.5* 66.4* 66.5* 67.1* 67.3*  PLT 311  < > 338 366 374 366 312  < > = values in this interval not displayed. Basic Metabolic Panel:  Recent Labs Lab 04/12/17 0126  04/13/17 0819 04/14/17 1109 04/15/17 0526 04/16/17 0509 04/17/17 0420  NA  --   < > 137 138 134* 134* 135  K  --   < > 4.0 3.6 3.7 4.1 4.0  CL  --   < > 100* 98* 96* 96* 98*  CO2  --   < > 28 31 31 30 29   GLUCOSE  --   < > 144* 109* 155* 107* 122*  BUN  --   < > 17 7 7 11 12   CREATININE  --   < > 0.72 0.62 0.77 0.99 0.94  CALCIUM  --   < > 9.8 9.8 9.4 9.3 9.3  MG 2.0   --   --   --   --   --   --   < > = values in this interval not displayed. GFR: Estimated Creatinine Clearance: 73 mL/min (by C-G formula based on SCr of 0.94 mg/dL). Liver Function Tests:  Recent Labs Lab 04/11/17 1725 04/12/17 0912  AST 51* 46*  ALT 25 25  ALKPHOS 104 97  BILITOT 0.3 0.5  PROT 7.9 7.6  ALBUMIN 4.1 3.7   No results for input(s): LIPASE, AMYLASE in the last 168 hours. No results for input(s): AMMONIA in the last 168 hours. Coagulation Profile:  Recent Labs Lab 04/14/17 1109 04/15/17 0526 04/16/17 0509 04/17/17 0420 04/18/17 0703  INR  2.75 2.47 2.44 2.57 2.55   Cardiac Enzymes:  Recent Labs Lab 04/11/17 1725 04/11/17 2135 04/12/17 0126 04/12/17 0912 04/13/17 0819 04/13/17 2158 04/14/17 1109  CKTOTAL 1,803*  --   --  1,177* 976*  --  587*  TROPONINI  --  <0.03 <0.03 <0.03  --  0.09* 0.08*   BNP (last 3 results) No results for input(s): PROBNP in the last 8760 hours. HbA1C: No results for input(s): HGBA1C in the last 72 hours. CBG:  Recent Labs Lab 04/11/17 1957  GLUCAP 85   Lipid Profile: No results for input(s): CHOL, HDL, LDLCALC, TRIG, CHOLHDL, LDLDIRECT in the last 72 hours. Thyroid Function Tests: No results for input(s): TSH, T4TOTAL, FREET4, T3FREE, THYROIDAB in the last 72 hours. Anemia Panel: No results for input(s): VITAMINB12, FOLATE, FERRITIN, TIBC, IRON, RETICCTPCT in the last 72 hours.    Radiology Studies: I have reviewed all of the imaging during this hospital visit personally     Scheduled Meds: . ARIPiprazole  5 mg Oral Daily  . benztropine  0.5 mg Oral QHS  . feeding supplement  1 Container Oral TID BM  . hydrochlorothiazide  12.5 mg Oral Daily  . metoprolol tartrate  25 mg Oral BID  . mometasone-formoterol  2 puff Inhalation BID  . sertraline  200 mg Oral Daily  . warfarin  7.5 mg Oral ONCE-1800  . Warfarin - Pharmacist Dosing Inpatient   Does not apply q1800   Continuous Infusions: . acyclovir  Stopped (04/18/17 0750)     LOS: 7 days        Mauricio Gerome Apley, MD [facility] Pager [phone]

## 2017-04-18 NOTE — Progress Notes (Signed)
ANTICOAGULATION CONSULT NOTE - Follow Up Consult  Pharmacy Consult for Coumadin Indication: atrial fibrillation  Allergies  Allergen Reactions  . Meprobamate Nausea And Vomiting   Patient Measurements: Height: 5\' 2"  (157.5 cm) Weight: 267 lb 8 oz (121.3 kg) IBW/kg (Calculated) : 50.1   Vital Signs: Temp: 98.5 F (36.9 C) (10/01 0635) Temp Source: Axillary (10/01 0635) BP: 167/96 (10/01 0635) Pulse Rate: 80 (10/01 0635)  Labs:  Recent Labs  04/16/17 0509 04/17/17 0420 04/18/17 0703  HGB 9.9* 10.0* 11.3*  HCT 32.2* 32.6* 37.1  PLT 374 366 312  LABPROT 26.3* 27.4* 27.2*  INR 2.44 2.57 2.55  CREATININE 0.99 0.94  --    Estimated Creatinine Clearance: 73 mL/min (by C-G formula based on SCr of 0.94 mg/dL).  Assessment:  Anticoag: 66 yof has been on warfarin PTA for atrial fibrillation.  PTA: 11.25 daily except 7.5 mg MF as of anti-coag visit on 9/21, INR 3.45 on admit   INR therapeutic  2.55; CBC stable, not bleeding documented    Goal of Therapy:  INR 2-3 Monitor platelets by anticoagulation protocol: Yes   Plan:  Warfarin 7.5mg  x1 tonight Daily INR Monitor for s/sx of bleeding  Georga Bora, PharmD Clinical Pharmacist 04/18/2017 11:08 AM

## 2017-04-18 NOTE — Progress Notes (Signed)
Pt. With with CP 8/10. Nitro given x 2, O2 via [place] at 2L. Pt. Now states pain is a 2/10 and is easing off. States she feels better now. Pt. Also states she has a history of acid reflux. VSS, pt. In stable condition. On call NP, K. Schorr of [place] paged to make aware. RN will continue to monitor.

## 2017-04-18 NOTE — Progress Notes (Signed)
[facility] PROGRESS NOTE  I saw and evaluated the patient, I personally obtain the key portions of the history and physical exam, I reviewed the physician assistant student documentation and agree with the physician assistant student medical decision-making. Further assessment and plan are as follows:  Please see attending's daily progress note.  Michele [name] M.D.  Michele [name] WJX:[id number] DOB: 1950/07/27 DOA: 04/11/2017 PCP: [name], MD  Brief Narrative Michele [name] is a 66 year old female who presented to the ED on 9/24 after a mechanical fall with a history of OSA, HTN, asthma, DM II, myelofibrosis, paroxysmal atrial fibrillation, CAD s/p stenting Her daughter found her on the floor as she was unable to get up by herself. Prior to admission, she was diagnosed with herpes zoster on right flank and started on topical antivirals. While in the ED, she was found to have acute kidney injury and rhabdomyolysis. She was started on IV fluids and admitted. 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  Assessment/Plan: Principal Problem:   AKI (acute kidney injury) ([place]) Active Problems:   Obstructive sleep apnea   Essential hypertension   Coronary atherosclerosis   Asthma, mild intermittent   Diabetes mellitus, type II (HCC)   Anemia, iron deficiency   Paroxysmal A-fib (HCC)   AF (paroxysmal atrial fibrillation) (HCC)   Anticoagulated   Hyperkalemia   Rhabdomyolysis   ARF (acute renal failure) ([place])  1. Zoster Viral Encephalitis -Improving with IV acyclovir. Able to answer all mental status questions appropriately.  -Continue home medications: aripiprazole,cogentin, sertraline -Plan for 10-14 days of IV acyclovir. Currently on day 5. Plan for PICC line insertion today for continued medical therapy at home.  2. Herpes Zoster -On right flank, improving. Changed to IV Acyclovir on 9/26 -Patient reporting pain well controlled with  tylenol and norco 10-325  3. Acute kidney injury -Resolved. Cr today 0.77 from 2.60 on 9/24  4. Paroxysmal atrial fibrillation -INR 2.55 this morning. Pharmacy monitoring coumadin  5. HTN - Elevated this morning. Restarted HCTZ 12.5. Continue metoprolol  7. Asthma -Stable at this time. Continue Dulera daily and Albuterol PRN  8. DM II -Continue diabetic diet.   9. CAD s/p stenting -Continue nitro PRN and Imdur for ongoing chest pain.   Code Status: Full Family Communication: Sister at bedside and spoke to daughter on phone while in the room Disposition Plan: Once medically stable, likely tomorrow.   Consultants:  None  Procedures:  None  Antibiotics:  IV Acyclovir 9/27 - (needs 10-14 days)  HPI/Subjective: Patient's mentation continues to improve. Complaining of pain associated with shingles. Denies chest pain, shortness of breath, nausea, vomiting, or diarrhea. No other complaints today.   Objective: Vitals:   04/17/17 2004 04/18/17 0635  BP:  (!) 167/96  Pulse:  80  Resp:  20  Temp:  98.5 F (36.9 C)  SpO2: 95% 97%    Intake/Output Summary (Last 24 hours) at 04/18/17 1117 Last data filed at 04/18/17 0636  Gross per 24 hour  Intake             1267 ml  Output             3200 ml  Net            -1933 ml   Filed Weights   04/16/17 0637 04/17/17 0707 04/18/17 0639  Weight: 127.1 kg (280 lb 3.2 oz) 126.5 kg (278 lb 12.8 oz) 121.3 kg (267 lb 8 oz)   Exam:  General:  No acute distress. Sitting comfortably in chair  Cardiovascular: Regular rate and rhythm. No murmurs/rubs/gallops. 2+ peripheral pulses  Respiratory: Clear to auscultation bilaterally. No increased work of breathing.  Abdomen: Soft, non distended, non tender.   Skin: right flank ulcerative rash. One area of crusting, but areas of ulceration on posterior flank and under right breast. No erythema.  Musculoskeletal: No obvious deformity   Data Reviewed: Basic Metabolic  Panel:  Recent Labs Lab 04/12/17 0126  04/13/17 0819 04/14/17 1109 04/15/17 0526 04/16/17 0509 04/17/17 0420  NA  --   < > 137 138 134* 134* 135  K  --   < > 4.0 3.6 3.7 4.1 4.0  CL  --   < > 100* 98* 96* 96* 98*  CO2  --   < > 28 31 31 30 29   GLUCOSE  --   < > 144* 109* 155* 107* 122*  BUN  --   < > 17 7 7 11 12   CREATININE  --   < > 0.72 0.62 0.77 0.99 0.94  CALCIUM  --   < > 9.8 9.8 9.4 9.3 9.3  MG 2.0  --   --   --   --   --   --   < > = values in this interval not displayed. Liver Function Tests:  Recent Labs Lab 04/11/17 1725 04/12/17 0912  AST 51* 46*  ALT 25 25  ALKPHOS 104 97  BILITOT 0.3 0.5  PROT 7.9 7.6  ALBUMIN 4.1 3.7   CBC:  Recent Labs Lab 04/11/17 1725  04/14/17 1109 04/15/17 0526 04/16/17 0509 04/17/17 0420 04/18/17 0703  WBC 8.7  < > 7.5 8.7 10.4 8.3 8.2  NEUTROABS 5.5  --  4.8  --  6.9  --   --   HGB 10.4*  < > 9.3* 9.3* 9.9* 10.0* 11.3*  HCT 34.9*  < > 30.1* 29.7* 32.2* 32.6* 37.1  MCV 69.7*  < > 67.5* 66.4* 66.5* 67.1* 67.3*  PLT 311  < > 338 366 374 366 312  < > = values in this interval not displayed. Cardiac Enzymes:  Recent Labs Lab 04/11/17 1725 04/11/17 2135 04/12/17 0126 04/12/17 0912 04/13/17 0819 04/13/17 2158 04/14/17 1109  CKTOTAL 1,803*  --   --  1,177* 976*  --  587*  TROPONINI  --  <0.03 <0.03 <0.03  --  0.09* 0.08*   CBG:  Recent Labs Lab 04/11/17 1957  GLUCAP 85   Scheduled Meds: . ARIPiprazole  5 mg Oral Daily  . benztropine  0.5 mg Oral QHS  . feeding supplement  1 Container Oral TID BM  . metoprolol tartrate  25 mg Oral BID  . mometasone-formoterol  2 puff Inhalation BID  . sertraline  200 mg Oral Daily  . warfarin  7.5 mg Oral ONCE-1800  . Warfarin - Pharmacist Dosing Inpatient   Does not apply q1800   Continuous Infusions: . acyclovir Stopped (04/18/17 0750)   Time spent: 25 minutes  Minda Ditto, PA-S  [facility] If 7PM-7AM, please contact night-coverage at [email or web address],  password [facility] 04/18/2017, 11:17 AM  LOS: 7 days

## 2017-04-18 NOTE — Clinical Social Work Note (Signed)
Insurance will require OT evaluation for authorization review. Patient also has telesitter orders from 9/25 and per secretary, still has the telesitter. CSW paged MD regarding putting in OT order and that telesitter will have to be dc'ed for 24 hours prior to discharge to SNF.  Michele [name], Michele [name]

## 2017-04-18 NOTE — Progress Notes (Signed)
Physical Therapy Treatment Patient Details Name: Michele [name] MRN: [id number] DOB: 1951/07/08 Today's Date: 04/18/2017    History of Present Illness Michele [name] is a 66 year old female who presented to the ED on 9/24 after a mechanical fall with a history of OSA, HTN, asthma, DM II, myelofibrosis, paroxysmal atrial fibrillation, CAD s/p stenting  Her daughter found her on the floor as she was unable to get up by herself. 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 While in the ED, she was found to have an acute kidney injury and rhabdomyolysis. She was started on IV fluids and admitted. 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     PT Comments    Pt cognition continues to improve and she is making slow progress towards her mobility goals. Pt currently requires, minA for transfers and ambulation of 6 feet with RW. Pt ambulated to [facility] from EoB and then to recliner where she was performing standing therex when she requested to walk back to [facility]. Pt currently does not have enough LE to safely climb the 6 steps to enter her home at discharge and PT recommends discharge to SNF for improving strength and endurance. PT will continue to treat in acute setting to improve strength and mobility until discharge.     Follow Up Recommendations  Supervision/Assistance - 24 hour;SNF     Equipment Recommendations  Standard walker;3in1 (PT)    Recommendations for Other Services OT consult     Precautions / Restrictions Precautions Precautions: None Restrictions Weight Bearing Restrictions: No    Mobility  Bed Mobility               General bed mobility comments: seated EoB at entry  Transfers Overall transfer level: Needs assistance Equipment used: 1 person hand held assist;Rolling walker (2 wheeled) Transfers: Sit to/from Stand Sit to Stand: Min assist;From elevated surface         General  transfer comment: minA for steadying in upright, vc for hand placement for power up to RW, good power up with positioning,   Ambulation/Gait Ambulation/Gait assistance: Min assist Ambulation Distance (Feet): 2 Feet Assistive device: 1 person hand held assist Gait Pattern/deviations: Step-to pattern;Decreased step length - right;Decreased step length - left;Shuffle Gait velocity: slowed Gait velocity interpretation: Below normal speed for age/gender General Gait Details: slow shuffling steps from bed to [facility] to recliner and back to [facility], minA for HHA from bed to [facility], pt with more stability with RW ambulation to chair but still requires minA         Balance Overall balance assessment: Needs assistance Sitting-balance support: Feet supported Sitting balance-Leahy Scale: Fair     Standing balance support: Single extremity supported Standing balance-Leahy Scale: Fair Standing balance comment: min for steadying in upright                             Cognition Arousal/Alertness: Awake/alert Behavior During Therapy: WFL for tasks assessed/performed Overall Cognitive Status: Impaired/Different from baseline Area of Impairment: Memory;Following commands;Awareness;Orientation                     Memory: Decreased short-term memory Following Commands: Follows one step commands consistently   Awareness: Emergent   General Comments: continues to improve      Exercises General Exercises - Lower Extremity Hip Flexion/Marching: AROM;Both;10 reps;Standing    General Comments General comments (  skin integrity, edema, etc.): VSS      Pertinent Vitals/Pain Pain Assessment: 0-10 Pain Score: 3  Pain Location: shingles rash site on flank Pain Descriptors / Indicators: Radiating Pain Intervention(s): Limited activity within patient's tolerance;Monitored during session    Home Living Family/patient expects to be discharged to:: Private residence Living Arrangements:  Children;Other relatives Available Help at Discharge: Personal care attendant (3-4 hrs per day) Type of Home: House Home Access: Stairs to enter Entrance Stairs-Rails: None (wall) Home Layout: One level Home Equipment: Cane - single point;Shower seat - built in;Hand held shower head;Grab bars - tub/shower;Walker - standard      Prior Function Level of Independence: Needs assistance  Gait / Transfers Assistance Needed: walks with standard walker household distanced ADL's / Homemaking Assistance Needed: health aid helps with bathing and grooming, daughter helps withiADLs     PT Goals (current goals can now be found in the care plan section) Acute Rehab PT Goals Patient Stated Goal: feel better PT Goal Formulation: With patient Time For Goal Achievement: 04/29/17 Potential to Achieve Goals: Fair    Frequency    Min 3X/week      PT Plan Discharge plan needs to be updated       AM-PAC PT "6 Clicks" Daily Activity  Outcome Measure  Difficulty turning over in bed (including adjusting bedclothes, sheets and blankets)?: Unable Difficulty moving from lying on back to sitting on the side of the bed? : Unable Difficulty sitting down on and standing up from a chair with arms (e.g., wheelchair, bedside commode, etc,.)?: Unable Help needed moving to and from a bed to chair (including a wheelchair)?: Total Help needed walking in hospital room?: Total Help needed climbing 3-5 steps with a railing? : Total 6 Click Score: 6    End of Session Equipment Utilized During Treatment: Gait belt Activity Tolerance: Patient tolerated treatment well (limited by nausea) Patient left: with call bell/phone within reach;with chair alarm set;with family/visitor present;Other (comment) (pt on [facility], pt told to call for transfer, nurse tech notified) Nurse Communication: Mobility status PT Visit Diagnosis: Unsteadiness on feet (R26.81);Other abnormalities of gait and mobility (R26.89);Repeated falls  (R29.6);Muscle weakness (generalized) (M62.81);Difficulty in walking, not elsewhere classified (R26.2);History of falling (Z91.81);Pain Pain - part of body:  (trunk from shingles)     Time: 9937-1696 PT Time Calculation (min) (ACUTE ONLY): 27 min  Charges:  $Therapeutic Exercise: 8-22 mins $Therapeutic Activity: 8-22 mins                    G Codes:       Emmarie Sannes B. Migdalia Dk PT, DPT Acute Rehabilitation  [phone] Pager [phone]     [place] 04/18/2017, 10:02 AM

## 2017-04-18 NOTE — Consult Note (Signed)
WOC consulted for pressure injuries, arms. Discussed with bedside nurse patient has shingles that are drying up. No need for [place] consultation.   Re consult if needed, will not follow at this time. Thanks  Hanna Aultman [employer], RN,CWOCN, CNS, [place] [phone])

## 2017-04-18 NOTE — Progress Notes (Signed)
Placed patient on CPAP via FFM, auto settings (max 20, min 5.0) with 2 lpm O2 bleed in.

## 2017-04-18 NOTE — Clinical Social Work Note (Signed)
Clinical Social Work Assessment  Patient Details  Name: Michele [name] MRN: [id number] Date of Birth: August 30, 1950  Date of referral:  04/18/17               Reason for consult:  Facility Placement, Discharge Planning                Permission sought to share information with:  Facility Sport and exercise psychologist, Family Supports Permission granted to share information::  Yes, Verbal Permission Granted  Name::     Chiropractor::  SNF's  Relationship::  Daughter  Contact Information:  [phone]  Housing/Transportation Living arrangements for the past 2 months:  Single Family Home Source of Information:  Medical Team, Adult Children Patient Interpreter Needed:  None Criminal Activity/Legal Involvement Pertinent to Current Situation/Hospitalization:  No - Comment as needed Significant Relationships:  Adult Children, Siblings Lives with:  Adult Children Do you feel safe going back to the place where you live?  Yes Need for family participation in patient care:  Yes (Comment)  Care giving concerns:  PT changing recommendation from HHPT to SNF once medically stable for discharge.   Social Worker assessment / plan:  Physical therapist notified CSW of change in recommendations from [place] to SNF. Patient oriented to self only. Sister at bedside. CSW called patient's daughter. CSW introduced role and explained that PT recommendations would be discussed. Patient's daughter is agreeable to SNF placement. She does not have a preference, just wants the best facility. CSW explained that the referral could not be sent out until she is off airborne precautions to avoid denials. Patient's daughter anticipates patient will return home with home health after SNF. No further concerns. CSW encouraged patient's daughter to contact CSW as needed. CSW will continue to follow patient and her daughter for support and facilitate discharge to SNF once medically stable.  Employment status:   Retired Nurse, adult PT Recommendations:  [place] / Referral to community resources:  [place]  Patient/Family's Response to care:  Patient oriented to self only. Patient's daughter agreeable to SNF placement. Patient's daughter and sister supportive and involved in patient's care. Patient's daughter appreciated social work intervention.  Patient/Family's Understanding of and Emotional Response to Diagnosis, Current Treatment, and Prognosis:  Patient oriented to self only. Patient's daughter has a good understanding of the reason for admission and her need for rehab prior to returning home. Patient's daughter appears happy with hospital care.  Emotional Assessment Appearance:  Appears stated age Attitude/Demeanor/Rapport:  Unable to Assess Affect (typically observed):  Unable to Assess Orientation:  Oriented to Self Alcohol / Substance use:  Never Used Psych involvement (Current and /or in the community):  No (Comment)  Discharge Needs  Concerns to be addressed:  Care Coordination Readmission within the last 30 days:  No Current discharge risk:  Cognitively Impaired, Dependent with Mobility Barriers to Discharge:  Continued Medical Work up, Ship broker, Programmer, applications ([place])   Candie Chroman, LCSW 04/18/2017, 9:48 AM

## 2017-04-18 NOTE — Progress Notes (Signed)
Peripherally Inserted Central Catheter/Midline Placement  The IV Nurse has discussed with the patient and/or persons authorized to consent for the patient, the purpose of this procedure and the potential benefits and risks involved with this procedure.  The benefits include less needle sticks, lab draws from the catheter, and the patient may be discharged home with the catheter. Risks include, but not limited to, infection, bleeding, blood clot (thrombus formation), and puncture of an artery; nerve damage and irregular heartbeat and possibility to perform a PICC exchange if needed/ordered by physician.  Alternatives to this procedure were also discussed.  Bard Power PICC patient education guide, fact sheet on infection prevention and patient information card has been provided to patient /or left at bedside.    PICC/Midline Placement Documentation  PICC Single Lumen 04/18/17 PICC Right Brachial 43 cm (Active)  Indication for Insertion or Continuance of Line Home intravenous therapies (PICC only) 04/18/2017  4:00 PM  Site Assessment Clean;Dry;Intact 04/18/2017  4:00 PM  Line Status Flushed;Blood return noted 04/18/2017  4:00 PM  Dressing Type Transparent 04/18/2017  4:00 PM  Dressing Status Clean;Dry;Intact;Antimicrobial disc in place 04/18/2017  4:00 PM  Dressing Intervention New dressing 04/18/2017  4:00 PM  Dressing Change Due 04/25/17 04/18/2017  4:00 PM       Jule Economy [place] 04/18/2017, 4:43 PM

## 2017-04-19 ENCOUNTER — Ambulatory Visit (HOSPITAL_COMMUNITY): Payer: Self-pay | Admitting: Psychiatry

## 2017-04-19 ENCOUNTER — Ambulatory Visit: Payer: Self-pay | Admitting: Oncology

## 2017-04-19 DIAGNOSIS — B020 Zoster encephalitis: Secondary | ICD-10-CM

## 2017-04-19 DIAGNOSIS — Z7901 Long term (current) use of anticoagulants: Secondary | ICD-10-CM

## 2017-04-19 LAB — CBC
HEMATOCRIT: 27.5 % — AB (ref 36.0–46.0)
Hemoglobin: 8.5 g/dL — ABNORMAL LOW (ref 12.0–15.0)
MCH: 20.5 pg — ABNORMAL LOW (ref 26.0–34.0)
MCHC: 30.5 g/dL (ref 30.0–36.0)
MCV: 67.1 fL — ABNORMAL LOW (ref 78.0–100.0)
Platelets: 283 10*3/uL (ref 150–400)
RBC: 4.1 MIL/uL (ref 3.87–5.11)
RDW: 16.6 % — AB (ref 11.5–15.5)
WBC: 8.4 10*3/uL (ref 4.0–10.5)

## 2017-04-19 LAB — PROTIME-INR
INR: 2.48
Prothrombin Time: 26.6 seconds — ABNORMAL HIGH (ref 11.4–15.2)

## 2017-04-19 MED ORDER — WARFARIN SODIUM 7.5 MG PO TABS
7.5000 mg | ORAL_TABLET | Freq: Once | ORAL | Status: AC
  Administered 2017-04-19: 7.5 mg via ORAL
  Filled 2017-04-19: qty 1

## 2017-04-19 NOTE — NC FL2 (Signed)
[place] MEDICAID FL2 LEVEL OF CARE SCREENING TOOL     IDENTIFICATION  Patient Name: Michele [name] Birthdate: [birth year] Sex: female Admission Date (Current Location): 04/11/2017  [facility] and [place] Number:  Herbalist and Address:  [facility], [place] [address] [place], [place] [address]      Provider Number: [id number]  Attending Physician Name and Address:  Tawni Millers  Relative Name and Phone Number:       Current Level of Care: Hospital Recommended Level of Care: [place] Prior Approval Number:    Date Approved/Denied:   PASRR Number: Manual review  Discharge Plan: SNF    Current Diagnoses: Patient Active Problem List   Diagnosis Date Noted  . AKI (acute kidney injury) ([place]) 04/11/2017  . AF (paroxysmal atrial fibrillation) ([place]) 04/11/2017  . Anticoagulated 04/11/2017  . Hyperkalemia 04/11/2017  . Rhabdomyolysis 04/11/2017  . ARF (acute renal failure) ([place]) 04/11/2017  . Iron deficiency anemia 07/13/2016  . Stroke ([place]) 11/19/2015  . Dyspnea on exertion 07/31/2015  . Abnormality of gait 05/22/2015  . Spinal stenosis in cervical region 05/22/2015  . Hypotension 06/25/2015  . Hyperlipidemia 07/17/2013  . Chest pain on exertion 12/14/2012  . Long term current use of anticoagulant therapy 10/03/2012  . Major depressive disorder, recurrent episode, moderate ([place]) 09/02/2011  . Menopausal and perimenopausal disorder 05/27/2011  . Depression 05/27/2011  . Urethrocele(618.03) 05/27/2011  . Anemia, iron deficiency 05/27/2011  . Paroxysmal A-fib ([place]) 05/27/2011  . Vitamin D deficiency 05/27/2011  . PTSD (post-traumatic stress disorder) 05/27/2011  . Abnormal brain MRI 05/27/2011  . PAD (peripheral artery disease) ([place]) 05/27/2011  . PFO (patent foramen ovale) 05/27/2011  . Diabetes mellitus, type II ([place])   . DM 12/16/2009  . 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 Obstructive sleep apnea  05/10/2007  . Essential hypertension 05/10/2007  . Coronary atherosclerosis 05/10/2007  . Seasonal and perennial allergic rhinitis 05/10/2007  . Asthma, mild intermittent 05/10/2007  . REFLUX, ESOPHAGEAL 05/10/2007    Orientation RESPIRATION BLADDER Height & Weight     Self  O2, Other (Comment) (Nasal Canula 2L. Cpap at night: auto settings (max 20, min 5.0) with 2 lpm O2 bleed in. ) Incontinent, External catheter Weight: 263 lb 9.6 oz (119.6 kg) Height:  5\' 2"  (157.5 cm)  BEHAVIORAL SYMPTOMS/MOOD NEUROLOGICAL BOWEL NUTRITION STATUS   (Cooperative, calm)  (History of stroke) Incontinent Diet (Heart healthy/carb modified)  AMBULATORY STATUS COMMUNICATION OF NEEDS Skin   Limited Assist Verbally Other (Comment) (Shingles)                       Personal Care Assistance Level of Assistance  Bathing, Feeding, Dressing Bathing Assistance: Limited assistance Feeding assistance: Independent Dressing Assistance: Limited assistance     Functional Limitations Info  Sight, Hearing, Speech Sight Info: Adequate Hearing Info: Adequate Speech Info: Adequate    SPECIAL CARE FACTORS FREQUENCY  PT (By licensed PT), OT (By licensed OT), Blood pressure     PT Frequency: 5 x week OT Frequency: 5 x week            Contractures Contractures Info: Not present    Additional Factors Info  Code Status, Allergies, Psychotropic, Isolation Precautions Code Status Info: Full Allergies Info: Meprobamate Psychotropic Info: Depression, PTSD: Abilify 5 mg PO daily, Cogentin 0.5 mg PO QHS, Zoloft 200 mg PO daily   Isolation Precautions Info: Airborne/Contact: Shingles     Current Medications (04/19/2017):  This is the current hospital active medication list Current Facility-Administered Medications  Medication Dose Route Frequency Provider Last Rate Last Dose  . acetaminophen (TYLENOL) tablet 650 mg  650 mg Oral Q6H PRN Rise Patience, MD   650 mg at 04/17/17 2213   Or  . acetaminophen  (TYLENOL) suppository 650 mg  650 mg Rectal Q6H PRN Rise Patience, MD      . acyclovir (ZOVIRAX) 1,250 mg in dextrose 5 % 250 mL IVPB  1,250 mg Intravenous Q8H Arrien, Jimmy Picket, MD   Stopped at 04/18/17 2240  . albuterol (PROVENTIL) (2.5 MG/3ML) 0.083% nebulizer solution 3 mL  3 mL Inhalation Q6H PRN Rise Patience, MD      . alum & mag hydroxide-simeth (MAALOX/MYLANTA) 200-200-20 MG/5ML suspension 30 mL  30 mL Oral Q6H PRN Arrien, Jimmy Picket, MD   30 mL at 04/15/17 0450  . ARIPiprazole (ABILIFY) tablet 5 mg  5 mg Oral Daily Rise Patience, MD   5 mg at 04/18/17 0953  . benztropine (COGENTIN) tablet 0.5 mg  0.5 mg Oral QHS Rise Patience, MD   0.5 mg at 04/18/17 2049  . feeding supplement (BOOST / RESOURCE BREEZE) liquid 1 Container  1 Container Oral TID BM Arrien, Jimmy Picket, MD   1 Container at 04/18/17 2140  . hydrALAZINE (APRESOLINE) injection 10 mg  10 mg Intravenous Q4H PRN Rise Patience, MD   10 mg at 04/17/17 0100  . hydrochlorothiazide (MICROZIDE) capsule 12.5 mg  12.5 mg Oral Daily Arrien, Jimmy Picket, MD   12.5 mg at 04/18/17 1742  . HYDROcodone-acetaminophen (NORCO) 10-325 MG per tablet 1 tablet  1 tablet Oral Q6H PRN Opyd, Ilene Qua, MD   1 tablet at 04/19/17 0241  . metoprolol tartrate (LOPRESSOR) tablet 25 mg  25 mg Oral BID Tawni Millers, MD   25 mg at 04/18/17 2049  . mometasone-formoterol (DULERA) 200-5 MCG/ACT inhaler 2 puff  2 puff Inhalation BID Rise Patience, MD   2 puff at 04/19/17 [phone]  . nitroGLYCERIN (NITROSTAT) SL tablet 0.4 mg  0.4 mg Sublingual Q5 min PRN Rise Patience, MD   0.4 mg at 04/18/17 2055  . ondansetron (ZOFRAN) injection 4 mg  4 mg Intravenous Q4H PRN Arrien, Jimmy Picket, MD   4 mg at 04/15/17 1118  . ondansetron (ZOFRAN) tablet 4 mg  4 mg Oral Q6H PRN Rise Patience, MD   4 mg at 04/15/17 2040  . promethazine (PHENERGAN) injection 12.5 mg  12.5 mg Intravenous Q6H PRN Opyd,  Ilene Qua, MD   12.5 mg at 04/15/17 0031  . sertraline (ZOLOFT) tablet 200 mg  200 mg Oral Daily Rise Patience, MD   200 mg at 04/18/17 0953  . sodium chloride flush (NS) 0.9 % injection 10-40 mL  10-40 mL Intracatheter PRN Arrien, Jimmy Picket, MD   20 mL at 04/19/17 0502  . Warfarin - Pharmacist Dosing Inpatient   Does not apply q1800 Arrien, Jimmy Picket, MD         Discharge Medications: Please see discharge summary for a list of discharge medications.  Relevant Imaging Results:  Relevant Lab Results:   Additional Information SS#: [id number]  Candie Chroman, LCSW

## 2017-04-19 NOTE — Evaluation (Signed)
Occupational Therapy Evaluation Patient Details Name: Michele [name] MRN: [id number] DOB: 1950-08-01 Today's Date: 04/19/2017    History of Present Illness Michele [name] is a 66 year old female who presented to the ED on 9/24 after a mechanical fall with a history of OSA, HTN, asthma, DM II, myelofibrosis, paroxysmal atrial fibrillation, CAD s/p stenting  Her daughter found her on the floor as she was unable to get up by herself. 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 While in the ED, she was found to have an acute kidney injury and rhabdomyolysis. She was started on IV fluids and admitted. 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    Clinical Impression   Pt has assist for self care and all IADL at baseline. She reports walking with a "4 prong cane." Pt is a questionable historian as her responses today differed from PT eval. Pt presents with impaired cognition, decreased balance and generalized weakness. She requires min assist for ADL and demonstrates decreased activity tolerance. Will follow acutely, recommending SNF for ST rehab.     Follow Up Recommendations  SNF;Supervision/Assistance - 24 hour    Equipment Recommendations  3 in 1 bedside commode    Recommendations for Other Services       Precautions / Restrictions Precautions Precautions: Fall Precaution Comments: airborne precautions Restrictions Weight Bearing Restrictions: No      Mobility Bed Mobility               General bed mobility comments: pt in chair  Transfers Overall transfer level: Needs assistance Equipment used: 1 person hand held assist Transfers: Sit to/from Stand Sit to Stand: Min assist         General transfer comment: increased time, steadying assist     Balance Overall balance assessment: Needs assistance   Sitting balance-Leahy Scale: Good Sitting balance - Comments: no LOB with  donning socks   Standing balance support: Single extremity supported Standing balance-Leahy Scale: Fair Standing balance comment: min for steadying in upright                            ADL either performed or assessed with clinical judgement   ADL Overall ADL's : Needs assistance/impaired Eating/Feeding: Independent;Sitting   Grooming: Wash/dry hands;Wash/dry face;Oral care;Sitting;Set up   Upper Body Bathing: Minimal assistance;Sitting   Lower Body Bathing: Minimal assistance;Sit to/from stand   Upper Body Dressing : Set up;Sitting   Lower Body Dressing: Minimal assistance;Sit to/from stand   Toilet Transfer: Minimal assistance;Stand-pivot;BSC   Toileting- Water quality scientist and Hygiene: Min guard;Sit to/from stand               Vision Baseline Vision/History: Wears glasses Wears Glasses: Reading only Patient Visual Report: No change from baseline       Perception     Praxis      Pertinent Vitals/Pain Pain Assessment: No/denies pain     Hand Dominance Right   Extremity/Trunk Assessment Upper Extremity Assessment Upper Extremity Assessment: Generalized weakness   Lower Extremity Assessment Lower Extremity Assessment: Defer to PT evaluation       Communication Communication Communication: No difficulties   Cognition Arousal/Alertness: Awake/alert Behavior During Therapy: WFL for tasks assessed/performed Overall Cognitive Status: Impaired/Different from baseline Area of Impairment: Memory;Following commands;Awareness;Orientation                 Orientation Level: Disoriented to;Time  Memory: Decreased short-term memory Following Commands: Follows one step commands with increased time       General Comments: pt repeats instructions prior to following through, inconsistencies with PLOF and home set up from PT eval, no family available to confirm   General Comments       Exercises     Shoulder Instructions      Home  Living Family/patient expects to be discharged to:: Private residence Living Arrangements: Children;Other relatives Available Help at Discharge: Personal care attendant (3-4 hours Tues and Thurs) Type of Home: House Home Access: Stairs to enter [employer] of Steps: 6 Entrance Stairs-Rails: None (wall) Home Layout: Two level;Able to live on main level with bedroom/bathroom     Bathroom Shower/Tub: Occupational psychologist: Standard     Home Equipment: Shower seat - built in;Hand held shower head;Grab bars - tub/shower;Walker - standard;Cane - quad;Cane - single point          Prior Functioning/Environment Level of Independence: Needs assistance  Gait / Transfers Assistance Needed: reports she walks with a "4 prong cane" ADL's / Homemaking Assistance Needed: health aid helps with bathing and grooming, but pt reports she can do it when the aide is not there, daughter helps withiADLs            OT Problem List: Decreased strength;Decreased activity tolerance;Impaired balance (sitting and/or standing);Decreased cognition;Decreased knowledge of use of DME or AE;Cardiopulmonary status limiting activity      OT Treatment/Interventions: Self-care/ADL training;DME and/or AE instruction;Therapeutic activities;Cognitive remediation/compensation;Patient/family education;Balance training    OT Goals(Current goals can be found in the care plan section) Acute Rehab OT Goals Patient Stated Goal: feel better OT Goal Formulation: With patient Time For Goal Achievement: 05/03/17 Potential to Achieve Goals: Good ADL Goals Pt Will Perform Grooming: with supervision;standing Pt Will Perform Lower Body Bathing: with supervision;sit to/from stand Pt Will Perform Lower Body Dressing: with supervision;sit to/from stand Pt Will Transfer to Toilet: with supervision;ambulating;bedside commode (over toilet) Pt Will Perform Toileting - Clothing Manipulation and hygiene: with  supervision;sit to/from stand Pt Will Perform Tub/Shower Transfer: Shower transfer;with supervision;ambulating;shower seat  OT Frequency: Min 2X/week   Barriers to D/C:            Co-evaluation              AM-PAC PT "6 Clicks" Daily Activity     Outcome Measure Help from another person eating meals?: None Help from another person taking care of personal grooming?: A Little Help from another person toileting, which includes using toliet, bedpan, or urinal?: A Little Help from another person bathing (including washing, rinsing, drying)?: A Little Help from another person to put on and taking off regular upper body clothing?: None Help from another person to put on and taking off regular lower body clothing?: A Little 6 Click Score: 20   End of Session Equipment Utilized During Treatment: Gait belt;Oxygen  Activity Tolerance: Patient tolerated treatment well Patient left: in chair;with call bell/phone within reach;with nursing/sitter in room  OT Visit Diagnosis: Unsteadiness on feet (R26.81);Muscle weakness (generalized) (M62.81);Other abnormalities of gait and mobility (R26.89);Repeated falls (R29.6);Other symptoms and signs involving cognitive function                Time: 2542-7062 OT Time Calculation (min): 25 min Charges:  OT General Charges $OT Visit: 1 Visit OT Evaluation $OT Eval Moderate Complexity: 1 Mod OT Treatments $Self Care/Home Management : 8-22 mins G-Codes:     Malka So 04/19/2017,  9:14 AM  04/19/2017 Nestor Lewandowsky, OTR/L Pager: [phone]

## 2017-04-19 NOTE — Clinical Social Work Note (Addendum)
CSW faxed H&P, FL2, and 30 day note to [facility] for PASARR review. CSW will update FL2 and fax out referral once patient is off airborne precautions.  Dayton Scrape, [place]  2:05 pm PASARR obtained: [phone] [id number]. It expires on 11/1.  Dayton Scrape, [place]

## 2017-04-19 NOTE — Progress Notes (Signed)
PROGRESS NOTE    SHANEEN REESER  IRC:[id number] DOB: Jan 05, 1951 DOA: 04/11/2017 PCP: [name], MD    Brief Narrative:  66 year old female presented after a fall. Patient does have the past medical history of paroxysmal atrial dilation, coronary disease status post stenting, sleep apnea, type 2 diabetes mellitus, chronic anemia and hypertension. The morning of the day of admission, patient was found on the floor, apparently she fell the night before and was unable to stand back on her feet. Recent diagnosis of herpes zoster. On initial physical examination blood pressure 108/59, 98/55, heart rate 59, 54, respiratory 13, 14, oxygen saturation 100%. Lungs were clear to auscultation bilaterally, heart S1-S2 present and rhythmic, abdomen was soft nontender, no lower extremity edema, neurologically nonfocal. Sodium 135, potassium 5.2, chloride 96, bicarbonate 27, glucose 98, BUN 50, creatinine 2.41, total CPK 1803, white count 8.7, hemoglobin 13.4, hematocrit 34.9, platelets 311, INR 3.4, urinalysis negative for infection. Head CT with no acte findings, chronic small vessel white matter ischemic changes and remote left basal ganglia infarct. Chest x-ray with increase in lung markings bilaterally, hilar congestion. EKG normal sinus rhythm, normal axis, normal intervals.  Patient admitted to the hospital with working diagnosis of rhabdomyolysis due to trauma complicated by acute kidney injury. 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   Assessment & Plan:   Principal Problem:   AKI (acute kidney injury) ([place]) Active Problems:   Obstructive sleep apnea   Essential hypertension   Coronary atherosclerosis   Asthma, mild intermittent   Diabetes mellitus, type II (HCC)   Anemia, iron deficiency   Paroxysmal A-fib (HCC)   AF (paroxysmal atrial fibrillation) (HCC)   Anticoagulated   Hyperkalemia   Rhabdomyolysis   ARF (acute renal failure) ([place])  1. Varicella-zoster encephalitis. Patient showed persistent confusion, disorientation, hallucinations, the diagnosis of zoster encephalitis was made, patient was placed on IV acyclovir, with significant improvement of her symptoms. No lumbar puncture was performed due to significant improvement of symptoms with IV acyclovir, and evident typical zoster rash, highly suggestive diagnosis of viral encephalitis. Patient received IV fluids, neuro checks every 4 hours, aspiration precautions, physical therapy, nutrition evaluation and appropriate analgesics. Patient will complete 14 days of IV acyclovir, PICC line was placed, patient will complete a acyclovir on October 12. Patient's family will aid in administration of antibiotics, home health, home physical therapy has been arranged. By the time of discharge patient was awake and alert, no further confusion or disorientation.  2. Acute kidney injury with rhabdomyolysis. Patient received IV fluids, crystalloid isotonic solutions, kidney function improved, her discharge creatinine 0.94, potassium 4.0, serum bicarbonate 29. Her CPK trended down with no major complications.  3. Paroxysmal atrial fibrillation. Her AV blockers were held during the first days of hospitalization, she was slowly reintroduced to low-dose metoprolol with good toleration. Her INR was followed closely, warfarin was resumed once INR below 3. Patient will resume her AV blockade discharge per her home regimen, her discharge INR is 2.48.  4. Hypertension. Her antihypertensive agents were initially held, there were slowly introduced during her observation stay, was low-dose metoprolol and hydrochlorothiazide. Patient will resume irbesartan and isosorbide at discharge.  5. Acute herpes zoster. Patient received analgesics and local wound care, no signs of overt bacterial infection. By the time of discharge,  about 70% of her lesions are crusted, still open ulcers on the right flank and underneath her right breast. She  was placed on contact and droplet precautions, while in hospital.  6. Obstructive sleep apnea and morbid obesity, BMI 51. Patient was placed on CPAP, with good toleration. She will need to have close follow-up as an outpatient, recommend lifestyle modifications.   Late entry: Patient's family had decided to transfer pansfer to skilled facility to continue IV antibiotic therapy and physical therapy. Patient will need to be off droplet precautions before a bed can be requested. Currently his lesions are 100% crusted.    DVT prophylaxis: enoxaparin  Code Status: full Family Communication: no family at the bedside Disposition Plan: SNF when off droplet precautions.   Consultants:     Procedures:   PICC 10/01  Antimicrobials:   IV Acyclovir  #5/14   Subjective: Patient feeling better, no nausea or vomiting, no chest pain or dyspnea.   Objective: Vitals:   04/18/17 2245 04/19/17 0638 04/19/17 0939 04/19/17 1021  BP:  131/72  125/60  Pulse:  99 77 75  Resp:  20 18 18   Temp:  98.5 F (36.9 C)  97.8 F (36.6 C)  TempSrc:  Oral  Oral  SpO2: 99% 97% 99% 100%  Weight:  119.6 kg (263 lb 9.6 oz)    Height:        Intake/Output Summary (Last 24 hours) at 04/19/17 1422 Last data filed at 04/19/17 0600  Gross per 24 hour  Intake             1305 ml  Output             1800 ml  Net             -495 ml   Filed Weights   04/17/17 0707 04/18/17 0639 04/19/17 7001  Weight: 126.5 kg (278 lb 12.8 oz) 121.3 kg (267 lb 8 oz) 119.6 kg (263 lb 9.6 oz)    Examination:  General: Pt is alert, awake, not in acute distress Neurology: patient is awake and alert, non focal, no confusion or disorientation.  E ENT: mid pallor, no icterus.  Cardiovascular: RRR, S1/S2 +, no rubs, no gallops Respiratory: CTA bilaterally, no wheezing, no rhonchi Abdominal: Soft, NT, ND, bowel sounds  + Extremities: no edema, no cyanosis Skin. Open dry sub- centimeter dry ulcers on the right flank and under the right breast, right lateral side lesions have crusted.       Data Reviewed: I have personally reviewed following labs and imaging studies  CBC:  Recent Labs Lab 04/14/17 1109 04/15/17 0526 04/16/17 0509 04/17/17 0420 04/18/17 0703 04/19/17 0448  WBC 7.5 8.7 10.4 8.3 8.2 8.4  NEUTROABS 4.8  --  6.9  --   --   --   HGB 9.3* 9.3* 9.9* 10.0* 11.3* 8.5*  HCT 30.1* 29.7* 32.2* 32.6* 37.1 27.5*  MCV 67.5* 66.4* 66.5* 67.1* 67.3* 67.1*  PLT 338 366 374 366 312 749   Basic Metabolic Panel:  Recent Labs Lab 04/13/17 0819 04/14/17 1109 04/15/17 0526 04/16/17 0509 04/17/17 0420  NA 137 138 134* 134* 135  K 4.0 3.6 3.7 4.1 4.0  CL 100* 98* 96* 96* 98*  CO2 28 31 31 30 29   GLUCOSE 144* 109* 155* 107* 122*  BUN 17 7 7 11 12   CREATININE 0.72 0.62 0.77 0.99 0.94  CALCIUM 9.8 9.8 9.4 9.3 9.3   GFR: Estimated Creatinine Clearance: 72.4 mL/min (by C-G formula based on SCr of 0.94 mg/dL). Liver Function Tests: No results for input(s): AST, ALT, ALKPHOS, BILITOT, PROT, ALBUMIN in the last  168 hours. No results for input(s): LIPASE, AMYLASE in the last 168 hours. No results for input(s): AMMONIA in the last 168 hours. Coagulation Profile:  Recent Labs Lab 04/15/17 0526 04/16/17 0509 04/17/17 0420 04/18/17 0703 04/19/17 0448  INR 2.47 2.44 2.57 2.55 2.48   Cardiac Enzymes:  Recent Labs Lab 04/13/17 0819 04/13/17 2158 04/14/17 1109  CKTOTAL 976*  --  587*  TROPONINI  --  0.09* 0.08*   BNP (last 3 results) No results for input(s): PROBNP in the last 8760 hours. HbA1C: No results for input(s): HGBA1C in the last 72 hours. CBG: No results for input(s): GLUCAP in the last 168 hours. Lipid Profile: No results for input(s): CHOL, HDL, LDLCALC, TRIG, CHOLHDL, LDLDIRECT in the last 72 hours. Thyroid Function Tests: No results for input(s): TSH, T4TOTAL, FREET4,  T3FREE, THYROIDAB in the last 72 hours. Anemia Panel: No results for input(s): VITAMINB12, FOLATE, FERRITIN, TIBC, IRON, RETICCTPCT in the last 72 hours.    Radiology Studies: I have reviewed all of the imaging during this hospital visit personally     Scheduled Meds: . ARIPiprazole  5 mg Oral Daily  . benztropine  0.5 mg Oral QHS  . feeding supplement  1 Container Oral TID BM  . hydrochlorothiazide  12.5 mg Oral Daily  . metoprolol tartrate  25 mg Oral BID  . mometasone-formoterol  2 puff Inhalation BID  . sertraline  200 mg Oral Daily  . warfarin  7.5 mg Oral ONCE-1800  . Warfarin - Pharmacist Dosing Inpatient   Does not apply q1800   Continuous Infusions: . acyclovir 1,250 mg (04/19/17 1338)     LOS: 8 days        Aizlyn Schifano Gerome Apley, MD [facility] Pager [phone]

## 2017-04-19 NOTE — Clinical Social Work Placement (Signed)
   CLINICAL SOCIAL WORK PLACEMENT  NOTE  Date:  04/19/2017  Patient Details  Name: Michele [name] MRN: [id number] Date of Birth: [birth year]  Clinical Social Work is seeking post-discharge placement for this patient at the [place] level of care (*CSW will initial, date and re-position this form in  chart as items are completed):  Yes   Patient/family provided with [facility] Work Department's list of facilities offering this level of care within the geographic area requested by the patient (or if unable, by the patient's family).      Patient/family informed of their freedom to choose among providers that offer the needed level of care, that participate in Medicare, Medicaid or managed care program needed by the patient, have an available bed and are willing to accept the patient.  Yes   Patient/family informed of [facility]'s ownership interest in [facility] and [facility], as well as of the fact that they are under no obligation to receive care at these facilities.  PASRR submitted to EDS on 04/18/17     PASRR number received on 04/19/17     Existing PASRR number confirmed on       FL2 transmitted to all facilities in geographic area requested by pt/family on 04/19/17     FL2 transmitted to all facilities within larger geographic area on       Patient informed that his/her managed care company has contracts with or will negotiate with certain facilities, including the following:            Patient/family informed of bed offers received.  Patient chooses bed at       Physician recommends and patient chooses bed at      Patient to be transferred to   on  .  Patient to be transferred to facility by       Patient family notified on   of transfer.  Name of family member notified:        PHYSICIAN Please sign FL2     Additional Comment:    _______________________________________________ Candie Chroman, LCSW 04/19/2017,  3:34 PM

## 2017-04-19 NOTE — NC FL2 (Signed)
[place] MEDICAID FL2 LEVEL OF CARE SCREENING TOOL     IDENTIFICATION  Patient Name: Michele [name] Birthdate: Oct 25, 1950 Sex: female Admission Date (Current Location): 04/11/2017  [facility] and [place] Number:  Herbalist and Address:  [facility], [place] [address] [place], [place] [address]      Provider Number: [id number]  Attending Physician Name and Address:  Tawni Millers  Relative Name and Phone Number:       Current Level of Care: Hospital Recommended Level of Care: [place] Prior Approval Number:    Date Approved/Denied:   PASRR Number: [id number] E. Expires 11/1  Discharge Plan: SNF    Current Diagnoses: Patient Active Problem List   Diagnosis Date Noted  . AKI (acute kidney injury) ([place]) 04/11/2017  . AF (paroxysmal atrial fibrillation) ([place]) 04/11/2017  . Anticoagulated 04/11/2017  . Hyperkalemia 04/11/2017  . Rhabdomyolysis 04/11/2017  . ARF (acute renal failure) ([place]) 04/11/2017  . Iron deficiency anemia 07/13/2016  . Stroke ([place]) 11/19/2015  . Dyspnea on exertion 07/31/2015  . Abnormality of gait 05/22/2015  . Spinal stenosis in cervical region 05/22/2015  . Hypotension 2020/09/2014  . Hyperlipidemia 07/17/2013  . Chest pain on exertion 12/14/2012  . Long term current use of anticoagulant therapy 10/03/2012  . Major depressive disorder, recurrent episode, moderate ([place]) 09/02/2011  . Menopausal and perimenopausal disorder 05/27/2011  . Depression 05/27/2011  . Urethrocele(618.03) 05/27/2011  . Anemia, iron deficiency 05/27/2011  . Paroxysmal A-fib ([place]) 05/27/2011  . Vitamin D deficiency 05/27/2011  . PTSD (post-traumatic stress disorder) 05/27/2011  . Abnormal brain MRI 05/27/2011  . PAD (peripheral artery disease) ([place]) 05/27/2011  . PFO (patent foramen ovale) 05/27/2011  . Diabetes mellitus, type II ([place])   . DM 12/16/2009  . 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 Obstructive  sleep apnea 05/10/2007  . Essential hypertension 05/10/2007  . Coronary atherosclerosis 05/10/2007  . Seasonal and perennial allergic rhinitis 05/10/2007  . Asthma, mild intermittent 05/10/2007  . REFLUX, ESOPHAGEAL 05/10/2007    Orientation RESPIRATION BLADDER Height & Weight     Self  O2, Other (Comment) (Nasal Canula 2L. Cpap at night: auto settings (max 20, min 5.0) with 2 lpm O2 bleed in. ) Incontinent, External catheter Weight: 263 lb 9.6 oz (119.6 kg) Height:  5\' 2"  (157.5 cm)  BEHAVIORAL SYMPTOMS/MOOD NEUROLOGICAL BOWEL NUTRITION STATUS   (Cooperative, calm)  (History of stroke) Incontinent Diet (Heart healthy/carb modified)  AMBULATORY STATUS COMMUNICATION OF NEEDS Skin   Limited Assist Verbally Other (Comment) (Shingles)                       Personal Care Assistance Level of Assistance  Bathing, Feeding, Dressing Bathing Assistance: Limited assistance Feeding assistance: Independent Dressing Assistance: Limited assistance     Functional Limitations Info  Sight, Hearing, Speech Sight Info: Adequate Hearing Info: Adequate Speech Info: Adequate    SPECIAL CARE FACTORS FREQUENCY  PT (By licensed PT), OT (By licensed OT), Blood pressure     PT Frequency: 5 x week OT Frequency: 5 x week            Contractures Contractures Info: Not present    Additional Factors Info  Isolation Precautions Code Status Info: Full Allergies Info: Meprobamate Psychotropic Info: Depression, PTSD: Abilify 5 mg PO daily, Cogentin 0.5 mg PO QHS, Zoloft 200 mg PO daily   Isolation Precautions Info: Airborne/Contact for shingles. Per RN, they are almost completely scabbed over and should  be off precautions in 1-2 days.     Current Medications (04/19/2017):  This is the current hospital active medication list Current Facility-Administered Medications  Medication Dose Route Frequency Provider Last Rate Last Dose  . acetaminophen (TYLENOL) tablet 650 mg  650 mg Oral Q6H PRN  Rise Patience, MD   650 mg at 04/17/17 2213   Or  . acetaminophen (TYLENOL) suppository 650 mg  650 mg Rectal Q6H PRN Rise Patience, MD      . acyclovir (ZOVIRAX) 1,250 mg in dextrose 5 % 250 mL IVPB  1,250 mg Intravenous Q8H Arrien, Jimmy Picket, MD   Stopped at 04/19/17 1438  . albuterol (PROVENTIL) (2.5 MG/3ML) 0.083% nebulizer solution 3 mL  3 mL Inhalation Q6H PRN Rise Patience, MD      . alum & mag hydroxide-simeth (MAALOX/MYLANTA) 200-200-20 MG/5ML suspension 30 mL  30 mL Oral Q6H PRN Arrien, Jimmy Picket, MD   30 mL at 04/15/17 0450  . ARIPiprazole (ABILIFY) tablet 5 mg  5 mg Oral Daily Rise Patience, MD   5 mg at 04/19/17 1021  . benztropine (COGENTIN) tablet 0.5 mg  0.5 mg Oral QHS Rise Patience, MD   0.5 mg at 04/18/17 2049  . feeding supplement (BOOST / RESOURCE BREEZE) liquid 1 Container  1 Container Oral TID BM Arrien, Jimmy Picket, MD   1 Container at 04/19/17 1400  . hydrALAZINE (APRESOLINE) injection 10 mg  10 mg Intravenous Q4H PRN Rise Patience, MD   10 mg at 04/17/17 0100  . hydrochlorothiazide (MICROZIDE) capsule 12.5 mg  12.5 mg Oral Daily Arrien, Jimmy Picket, MD   12.5 mg at 04/19/17 1021  . HYDROcodone-acetaminophen (NORCO) 10-325 MG per tablet 1 tablet  1 tablet Oral Q6H PRN Opyd, Ilene Qua, MD   1 tablet at 04/19/17 0241  . metoprolol tartrate (LOPRESSOR) tablet 25 mg  25 mg Oral BID Tawni Millers, MD   25 mg at 04/19/17 1021  . mometasone-formoterol (DULERA) 200-5 MCG/ACT inhaler 2 puff  2 puff Inhalation BID Rise Patience, MD   2 puff at 04/19/17 [phone]  . nitroGLYCERIN (NITROSTAT) SL tablet 0.4 mg  0.4 mg Sublingual Q5 min PRN Rise Patience, MD   0.4 mg at 04/18/17 2055  . ondansetron (ZOFRAN) injection 4 mg  4 mg Intravenous Q4H PRN Arrien, Jimmy Picket, MD   4 mg at 04/15/17 1118  . ondansetron (ZOFRAN) tablet 4 mg  4 mg Oral Q6H PRN Rise Patience, MD   4 mg at 04/15/17 2040  .  promethazine (PHENERGAN) injection 12.5 mg  12.5 mg Intravenous Q6H PRN Opyd, Ilene Qua, MD   12.5 mg at 04/15/17 0031  . sertraline (ZOLOFT) tablet 200 mg  200 mg Oral Daily Rise Patience, MD   200 mg at 04/19/17 1021  . sodium chloride flush (NS) 0.9 % injection 10-40 mL  10-40 mL Intracatheter PRN Arrien, Jimmy Picket, MD   20 mL at 04/19/17 0502  . warfarin (COUMADIN) tablet 7.5 mg  7.5 mg Oral ONCE-1800 Rudisill, Jodean Lima, RPH      . Warfarin - Pharmacist Dosing Inpatient   Does not apply q1800 Arrien, Jimmy Picket, MD         Discharge Medications: Please see discharge summary for a list of discharge medications.  Relevant Imaging Results:  Relevant Lab Results:   Additional Information SS#: [id number]  Candie Chroman, LCSW

## 2017-04-19 NOTE — Progress Notes (Signed)
ANTICOAGULATION CONSULT NOTE - Follow Up Consult  Pharmacy Consult for Coumadin Indication: atrial fibrillation  Allergies  Allergen Reactions  . Meprobamate Nausea And Vomiting   Patient Measurements: Height: 5\' 2"  (157.5 cm) Weight: 263 lb 9.6 oz (119.6 kg) IBW/kg (Calculated) : 50.1   Vital Signs: Temp: 97.8 F (36.6 C) (10/02 1021) Temp Source: Oral (10/02 1021) BP: 125/60 (10/02 1021) Pulse Rate: 75 (10/02 1021)  Labs:  Recent Labs  04/17/17 0420 04/18/17 0703 04/19/17 0448  HGB 10.0* 11.3* 8.5*  HCT 32.6* 37.1 27.5*  PLT 366 312 283  LABPROT 27.4* 27.2* 26.6*  INR 2.57 2.55 2.48  CREATININE 0.94  --   --    Estimated Creatinine Clearance: 72.4 mL/min (by C-G formula based on SCr of 0.94 mg/dL).  Assessment:  Anticoag: 69 yof has been on warfarin PTA for atrial fibrillation.  PTA: 11.25 daily except 7.5 mg MF as of anti-coag visit on 9/21, INR 3.45 on admit   INR therapeutic  2.48; CBC stable, no bleeding documented    Goal of Therapy:  INR 2-3 Monitor platelets by anticoagulation protocol: Yes   Plan:  Warfarin 7.5mg  x1 tonight Daily INR Monitor for s/sx of bleeding  Georga Bora, PharmD Clinical Pharmacist 04/19/2017 1:10 PM

## 2017-04-20 ENCOUNTER — Ambulatory Visit: Payer: Medicare Other | Admitting: Cardiovascular Disease

## 2017-04-20 ENCOUNTER — Telehealth: Payer: Self-pay | Admitting: Internal Medicine

## 2017-04-20 DIAGNOSIS — G9341 Metabolic encephalopathy: Secondary | ICD-10-CM

## 2017-04-20 DIAGNOSIS — D509 Iron deficiency anemia, unspecified: Secondary | ICD-10-CM

## 2017-04-20 LAB — CBC
HCT: 29.1 % — ABNORMAL LOW (ref 36.0–46.0)
Hemoglobin: 9 g/dL — ABNORMAL LOW (ref 12.0–15.0)
MCH: 21 pg — ABNORMAL LOW (ref 26.0–34.0)
MCHC: 30.9 g/dL (ref 30.0–36.0)
MCV: 67.8 fL — ABNORMAL LOW (ref 78.0–100.0)
PLATELETS: 301 10*3/uL (ref 150–400)
RBC: 4.29 MIL/uL (ref 3.87–5.11)
RDW: 17.2 % — ABNORMAL HIGH (ref 11.5–15.5)
WBC: 7.1 10*3/uL (ref 4.0–10.5)

## 2017-04-20 LAB — PROTIME-INR
INR: 2.11
PROTHROMBIN TIME: 23.5 s — AB (ref 11.4–15.2)

## 2017-04-20 MED ORDER — POLYETHYLENE GLYCOL 3350 17 G PO PACK
17.0000 g | PACK | Freq: Every day | ORAL | Status: DC | PRN
Start: 2017-04-20 — End: 2017-04-21

## 2017-04-20 MED ORDER — INSULIN ASPART 100 UNIT/ML ~~LOC~~ SOLN: 0.0000 [IU] | Freq: Three times a day (TID) | SUBCUTANEOUS | Status: DC

## 2017-04-20 MED ORDER — WARFARIN SODIUM 7.5 MG PO TABS
11.2500 mg | ORAL_TABLET | Freq: Once | ORAL | Status: AC
  Administered 2017-04-20: 11.25 mg via ORAL
  Filled 2017-04-20: qty 1.5

## 2017-04-20 NOTE — Clinical Social Work Note (Addendum)
CSW called patient's daughter and provided bed offers. Preferences (in order) are [facility], [facility], and [facility]. [facility] referrals are still pending. CSW called [facility] admissions coordinator. She will review referral and call CSW back. CSW notified her that patient is still on airborne precautions.  Dayton Scrape, [place] [phone]  2:42 pm Received call from patient's daughter. She said since we have not heard back from [facility] yet, she would like to go ahead and accept [facility]'s bed offer. Patient's daughter stated that the patient is on cpap at home so CSW told her that she would need to bring her machine to the facility once she is discharged there. CSW notified SNF hospital liaison that she had accepted the bed offer.  Dayton Scrape, [place]

## 2017-04-20 NOTE — Progress Notes (Signed)
Patient on CPAP via [facility], auto settings, 2 lpm bleed in.

## 2017-04-20 NOTE — Progress Notes (Signed)
ANTICOAGULATION CONSULT NOTE - Follow Up Consult  Pharmacy Consult for Coumadin Indication: atrial fibrillation  Allergies  Allergen Reactions  . Meprobamate Nausea And Vomiting   Patient Measurements: Height: 5\' 2"  (157.5 cm) Weight: 275 lb (124.7 kg) IBW/kg (Calculated) : 50.1   Vital Signs: Temp: 98.5 F (36.9 C) (10/03 0653) Temp Source: Oral (10/03 0653) BP: 128/77 (10/03 0653) Pulse Rate: 74 (10/03 0653)  Labs:  Recent Labs  04/18/17 0703 04/19/17 0448 04/20/17 0401  HGB 11.3* 8.5* 9.0*  HCT 37.1 27.5* 29.1*  PLT 312 283 301  LABPROT 27.2* 26.6* 23.5*  INR 2.55 2.48 2.11   Estimated Creatinine Clearance: 74.3 mL/min (by C-G formula based on SCr of 0.94 mg/dL).  Assessment:  Anticoag: 57 yof has been on warfarin PTA for atrial fibrillation.  PTA: 11.25 daily except 7.5 mg MF as of anti-coag visit on 9/21, INR 3.45 on admit   INR trending down but remains therapeutic 2.48>> 2.11; CBC stable, no bleeding documented    Goal of Therapy:  INR 2-3 Monitor platelets by anticoagulation protocol: Yes   Plan:  Warfarin 11.25 mg x1 tonight Daily INR Monitor for s/sx of bleeding  Georga Bora, PharmD Clinical Pharmacist 04/20/2017 10:58 AM

## 2017-04-20 NOTE — Progress Notes (Signed)
Physical Therapy Treatment Patient Details Name: CARLIN MAMONE MRN: [id number] DOB: 06-24-1951 Today's Date: 04/20/2017    History of Present Illness Ms. Osterloh is a 66 year old female who presented to the ED on 9/24 after a mechanical fall with a history of OSA, HTN, asthma, DM II, myelofibrosis, paroxysmal atrial fibrillation, CAD s/p stenting  Her daughter found her on the floor as she was unable to get up by herself. 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 While in the ED, she was found to have an acute kidney injury and rhabdomyolysis. She was started on IV fluids and admitted. 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     PT Comments    Patient making good progress with mobility and gait.  Agree with need for SNF at d/c for continued therapy.   Follow Up Recommendations  SNF;Supervision/Assistance - 24 hour     Equipment Recommendations  Standard walker;3in1 (PT)    Recommendations for Other Services       Precautions / Restrictions Precautions Precautions: Fall Precaution Comments: airborne precautions Restrictions Weight Bearing Restrictions: No    Mobility  Bed Mobility               General bed mobility comments: Patient in recliner as PT entered room.  Transfers Overall transfer level: Needs assistance Equipment used: Rolling walker (2 wheeled) Transfers: Sit to/from Stand Sit to Stand: Min assist         General transfer comment: Verbal cues for hand placement.  Min assist to rise to stance and to steady on first attempt.  On second attempt, patient required only min guard assist.  Patient able to take 30 marching steps in place with RW and min guard assist.  Patient performed 20 shallow squats, and sitting LE exercises.    Ambulation/Gait Ambulation/Gait assistance: Min guard Ambulation Distance (Feet): 30 Feet (30 marching steps with each  foot) Assistive device: Rolling walker (2 wheeled)           Stairs Stairs:  (Verbally reviewed/demonstrated safe negotiation of stairs.)          Wheelchair Mobility    Modified Rankin (Stroke Patients Only)       Balance           Standing balance support: No upper extremity supported Standing balance-Leahy Scale: Fair Standing balance comment: Maintains static standing balance.                            Cognition Arousal/Alertness: Awake/alert Behavior During Therapy: WFL for tasks assessed/performed Overall Cognitive Status: Impaired/Different from baseline Area of Impairment: Memory;Following commands;Awareness;Orientation                 Orientation Level: Disoriented to;Time   Memory: Decreased short-term memory Following Commands: Follows one step commands with increased time   Awareness: Emergent          Exercises General Exercises - Lower Extremity Long Arc Quad: AROM;Both;10 reps;Seated Hip Flexion/Marching: AROM;Both;20 reps;Seated Toe Raises: AROM;Both;15 reps;Seated Heel Raises: AROM;Both;15 reps;Seated Mini-Sqauts: AROM;Both;20 reps;Standing    General Comments        Pertinent Vitals/Pain Pain Assessment: 0-10 Pain Score: 3  Pain Location: shingles rash site on flank Pain Descriptors / Indicators: Radiating Pain Intervention(s): Monitored during session    Home Living  Prior Function            PT Goals (current goals can now be found in the care plan section) Acute Rehab PT Goals Patient Stated Goal: feel better Progress towards PT goals: Progressing toward goals    Frequency    Min 3X/week      PT Plan Current plan remains appropriate    Co-evaluation              AM-PAC PT "6 Clicks" Daily Activity  Outcome Measure  Difficulty turning over in bed (including adjusting bedclothes, sheets and blankets)?: Unable Difficulty moving from lying on back to  sitting on the side of the bed? : Unable Difficulty sitting down on and standing up from a chair with arms (e.g., wheelchair, bedside commode, etc,.)?: Unable Help needed moving to and from a bed to chair (including a wheelchair)?: A Little Help needed walking in hospital room?: A Little Help needed climbing 3-5 steps with a railing? : A Lot 6 Click Score: 11    End of Session   Activity Tolerance: Patient tolerated treatment well Patient left: in chair;with call bell/phone within reach;with chair alarm set;with family/visitor present Nurse Communication: Mobility status PT Visit Diagnosis: Unsteadiness on feet (R26.81);Muscle weakness (generalized) (M62.81);Other abnormalities of gait and mobility (R26.89);Repeated falls (R29.6)     Time: 3329-5188 PT Time Calculation (min) (ACUTE ONLY): 20 min  Charges:  $Therapeutic Activity: 8-22 mins                    G Codes:       Carita Pian. Sanjuana Kava, [facility] Acute Rehab Services Pager Heard 04/20/2017, 12:28 PM

## 2017-04-20 NOTE — Telephone Encounter (Signed)
Scheduled appts per 10/2 sch msg. Left voicemail for patient regarding this information - sending her a confirmation letter in the mail.

## 2017-04-20 NOTE — Progress Notes (Addendum)
PROGRESS NOTE        PATIENT DETAILS Name: Michele [name] Age: 66 y.o. Sex: female Date of Birth: 1951-02-18 Admit Date: 04/11/2017 Admitting Physician Rise Patience, MD LOV:FIEPPIR, Bailey Mech, MD  Brief Narrative: Patient is a 66 y.o. female past medical history of paroxysmal atrial fibrillation on anticoagulation, history of CAD status post remote PCI, anemia of chronic disease admitted with encephalopathy in a setting of acute renal failure and herpes zoster infection. See below for further details  Subjective: Awake and alert-per sister at bedside-although improved-she's still not back to her usual baseline.  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 Lumbar puncture was never pursued-as patient probably was on anticoagulation. Case discussed with infectious disease-Dr. Lucianne Lei dam-he recommends a MRI of the brain-which has been ordered. In the interim, we will continue with IV acyclovir.  Acute kidney injury: Likely hemodynamically mediated-resolved with supportive care.  Rhabdomyolysis: Resolved with supportive care.  Paroxysmal atrial fibrillation: Rate controlled with metoprolol, and Coumadin-INR therapeutic.  History of iron deficiency Anemia: This appears stable-probably worsened by acute illness-follow CBC periodically. No evidence of blood loss. Prior iron panel in March 2018 shows a significantly elevated ferritin.  Herpes zoster to right flank/below right breast area: Most of these lesions have crusted-continue with airborne isolation for another day or so-will reassess tomorrow  Hypertension: Controlled, continue metoprolol and HCTZ  DM-2: Metformin remains on hold, start CBG monitoring and SSI  History of major depressive disorder: Mood appears to be stable, continue Zoloft,  Cogentin and Abilify  DVT Prophylaxis: Full dose anticoagulation with Coumadin  Code Status: Full code   Family Communication: Sister at bedside  Disposition Plan: Remain inpatient-SNF on discharge  Antimicrobial agents: Anti-infectives    Start     Dose/Rate Route Frequency Ordered Stop   04/13/17 1400  acyclovir (ZOVIRAX) 1,250 mg in dextrose 5 % 250 mL IVPB     1,250 mg 275 mL/hr over 60 Minutes Intravenous Every 8 hours 04/13/17 1108     04/11/17 2315  valACYclovir (VALTREX) tablet 1,000 mg  Status:  Discontinued     1,000 mg Oral Daily 04/11/17 2313 04/13/17 1108      Procedures: None  CONSULTS:  None  Time spent: 25 minutes-Greater than 50% of this time was spent in counseling, explanation of diagnosis, planning of further management, and coordination of care.  MEDICATIONS: Scheduled Meds: . ARIPiprazole  5 mg Oral Daily  . benztropine  0.5 mg Oral QHS  . feeding supplement  1 Container Oral TID BM  . hydrochlorothiazide  12.5 mg Oral Daily  . metoprolol tartrate  25 mg Oral BID  . mometasone-formoterol  2 puff Inhalation BID  . sertraline  200 mg Oral Daily  . warfarin  11.25 mg Oral ONCE-1800  . Warfarin - Pharmacist Dosing Inpatient   Does not apply q1800   Continuous Infusions: . acyclovir Stopped (04/20/17 1453)   PRN Meds:.acetaminophen **OR** acetaminophen, albuterol, alum & mag hydroxide-simeth, hydrALAZINE, HYDROcodone-acetaminophen, nitroGLYCERIN, ondansetron (ZOFRAN) IV, ondansetron **OR** [DISCONTINUED] ondansetron (ZOFRAN) IV, polyethylene glycol, promethazine, sodium chloride flush   PHYSICAL EXAM: Vital signs: Vitals:   04/20/17 0330 04/20/17 0653 04/20/17 0945 04/20/17 1425  BP:  128/77  138/75  Pulse: 88 74  76  Resp: 16 20  20  Temp:  98.5 F (36.9 C)  98.4 F (36.9 C)  TempSrc:  Oral  Oral  SpO2: 97% 100% 100% 100%  Weight:  124.7 kg (275 lb)    Height:       Filed Weights   04/18/17 0639 04/19/17 0638 04/20/17 0653    Weight: 121.3 kg (267 lb 8 oz) 119.6 kg (263 lb 9.6 oz) 124.7 kg (275 lb)   Body mass index is 50.3 kg/m.   General appearance :Awake, alert, not in any distress. Speech Clear.  Eyes:, pupils equally reactive to light and accomodation,no scleral icterus. HEENT: Atraumatic and Normocephalic Neck: supple, no JVD. No cervical lymphadenopathy.  Resp:Good air entry bilaterally, no added sounds  CVS: S1 S2 regular, no murmurs.  GI: Bowel sounds present, Non tender and not distended with no gaurding, rigidity or rebound.No organomegaly Extremities: B/L Lower Ext shows no edema, both legs are warm to touch Neurology:  speech clear,Non focal, sensation is grossly intact. Psychiatric: Normal judgment and insight. Alert and oriented x 3 Musculoskeletal:No digital cyanosis Skin: Mostly crusted zoster lesions in the right flank and an below right breast  Wounds:N/A  I have personally reviewed following labs and imaging studies  LABORATORY DATA: CBC:  Recent Labs Lab 04/14/17 1109  04/16/17 0509 04/17/17 0420 04/18/17 0703 04/19/17 0448 04/20/17 0401  WBC 7.5  < > 10.4 8.3 8.2 8.4 7.1  NEUTROABS 4.8  --  6.9  --   --   --   --   HGB 9.3*  < > 9.9* 10.0* 11.3* 8.5* 9.0*  HCT 30.1*  < > 32.2* 32.6* 37.1 27.5* 29.1*  MCV 67.5*  < > 66.5* 67.1* 67.3* 67.1* 67.8*  PLT 338  < > 374 366 312 283 301  < > = values in this interval not displayed.  Basic Metabolic Panel:  Recent Labs Lab 04/14/17 1109 04/15/17 0526 04/16/17 0509 04/17/17 0420  NA 138 134* 134* 135  K 3.6 3.7 4.1 4.0  CL 98* 96* 96* 98*  CO2 31 31 30 29   GLUCOSE 109* 155* 107* 122*  BUN 7 7 11 12   CREATININE 0.62 0.77 0.99 0.94  CALCIUM 9.8 9.4 9.3 9.3    GFR: Estimated Creatinine Clearance: 74.3 mL/min (by C-G formula based on SCr of 0.94 mg/dL).  Liver Function Tests: No results for input(s): AST, ALT, ALKPHOS, BILITOT, PROT, ALBUMIN in the last 168 hours. No results for input(s): LIPASE, AMYLASE in the last  168 hours. No results for input(s): AMMONIA in the last 168 hours.  Coagulation Profile:  Recent Labs Lab 04/16/17 0509 04/17/17 0420 04/18/17 0703 04/19/17 0448 04/20/17 0401  INR 2.44 2.57 2.55 2.48 2.11    Cardiac Enzymes:  Recent Labs Lab 04/13/17 2158 04/14/17 1109  CKTOTAL  --  587*  TROPONINI 0.09* 0.08*    BNP (last 3 results) No results for input(s): PROBNP in the last 8760 hours.  HbA1C: No results for input(s): HGBA1C in the last 72 hours.  CBG: No results for input(s): GLUCAP in the last 168 hours.  Lipid Profile: No results for input(s): CHOL, HDL, LDLCALC, TRIG, CHOLHDL, LDLDIRECT in the last 72 hours.  Thyroid Function Tests: No results for input(s): TSH, T4TOTAL, FREET4, T3FREE, THYROIDAB in the last 72 hours.  Anemia Panel: No results for input(s): VITAMINB12, FOLATE, FERRITIN, TIBC, IRON, RETICCTPCT in the last 72 hours.  Urine analysis:    Component Value Date/Time   COLORURINE YELLOW 04/11/2017 2200   APPEARANCEUR HAZY (A) 04/11/2017 2200   LABSPEC  1.016 04/11/2017 2200   LABSPEC 1.025 06/23/2007 0832   PHURINE 5.0 04/11/2017 2200   GLUCOSEU NEGATIVE 04/11/2017 2200   HGBUR NEGATIVE 04/11/2017 2200   BILIRUBINUR NEGATIVE 04/11/2017 2200   BILIRUBINUR negative 04/28/2015 1157   BILIRUBINUR neg 12/08/2014 1227   BILIRUBINUR Negative 06/23/2007 0832   KETONESUR NEGATIVE 04/11/2017 2200   PROTEINUR NEGATIVE 04/11/2017 2200   UROBILINOGEN 0.2 04/28/2015 1157   UROBILINOGEN 0.2 01/30/2009 0628   NITRITE NEGATIVE 04/11/2017 2200   LEUKOCYTESUR NEGATIVE 04/11/2017 2200   LEUKOCYTESUR Negative 06/23/2007 0832    Sepsis Labs: Lactic Acid, Venous    Component Value Date/Time   LATICACIDVEN 0.6 04/12/2017 0126    MICROBIOLOGY: No results found for this or any previous visit (from the past 240 hour(s)).  RADIOLOGY STUDIES/RESULTS: Dg Chest 2 View  Result Date: 04/11/2017 CLINICAL DATA:  Altered mental status EXAM: CHEST  2 VIEW  COMPARISON:  05/03/2016 FINDINGS: Low lung volumes. The cardio pericardial silhouette is enlarged. There is pulmonary vascular congestion without overt pulmonary edema. The lungs are clear without focal pneumonia, edema, pneumothorax or pleural effusion. IMPRESSION: Low volume film with cardiomegaly and pulmonary vascular congestion. Electronically Signed   By: Misty Stanley M.D.   On: 04/11/2017 20:23   Dg Pelvis 1-2 Views  Result Date: 04/12/2017 CLINICAL DATA:  66 year old with confusion who fell earlier today. Initial encounter. EXAM: PELVIS - 1-2 VIEW COMPARISON:  Visualized pelvis on abdominal x-rays 11/27/2006. FINDINGS: No acute fractures identified involving the pelvis or either proximal femur. Hip joints anatomically aligned with well-preserved joint spaces for age. Sacroiliac joints and symphysis pubis intact. Degenerative changes involving the visualized lower lumbar spine. IMPRESSION: No acute osseous abnormality. Electronically Signed   By: Evangeline Dakin M.D.   On: 04/12/2017 10:04   Ct Head Wo Contrast  Result Date: 04/11/2017 CLINICAL DATA:  66 year old female with altered level of consciousness. EXAM: CT HEAD WITHOUT CONTRAST TECHNIQUE: Contiguous axial images were obtained from the base of the skull through the vertex without intravenous contrast. COMPARISON:  05/15/2015 brain MR and 04/28/2015 head CT a FINDINGS: Brain: No evidence of acute infarction, hemorrhage, hydrocephalus, extra-axial collection or mass lesion/mass effect. Chronic small-vessel white matter ischemic changes and remote left basal ganglia infarct again noted. Vascular: No hyperdense vessel or unexpected calcification. Skull: Normal. Negative for fracture or focal lesion. Sinuses/Orbits: No acute finding. Other: None. IMPRESSION: 1. No evidence of acute intracranial abnormality 2. Chronic small-vessel white matter ischemic changes and remote left basal ganglia infarct. Electronically Signed   By: Margarette Canada M.D.    On: 04/11/2017 17:20     LOS: 9 days   Oren Binet, MD  [facility] Pager:[phone]  If 7PM-7AM, please contact night-coverage [email or web address] Password [facility] 04/20/2017, 4:43 PM

## 2017-04-21 ENCOUNTER — Inpatient Hospital Stay (HOSPITAL_COMMUNITY): Payer: Medicare Other

## 2017-04-21 DIAGNOSIS — N171 Acute kidney failure with acute cortical necrosis: Secondary | ICD-10-CM

## 2017-04-21 DIAGNOSIS — B999 Unspecified infectious disease: Secondary | ICD-10-CM | POA: Diagnosis not present

## 2017-04-21 DIAGNOSIS — F29 Unspecified psychosis not due to a substance or known physiological condition: Secondary | ICD-10-CM | POA: Diagnosis not present

## 2017-04-21 DIAGNOSIS — I10 Essential (primary) hypertension: Secondary | ICD-10-CM | POA: Diagnosis not present

## 2017-04-21 DIAGNOSIS — I509 Heart failure, unspecified: Secondary | ICD-10-CM | POA: Diagnosis not present

## 2017-04-21 DIAGNOSIS — M6281 Muscle weakness (generalized): Secondary | ICD-10-CM | POA: Diagnosis not present

## 2017-04-21 DIAGNOSIS — J45909 Unspecified asthma, uncomplicated: Secondary | ICD-10-CM | POA: Diagnosis not present

## 2017-04-21 DIAGNOSIS — I480 Paroxysmal atrial fibrillation: Secondary | ICD-10-CM | POA: Diagnosis not present

## 2017-04-21 DIAGNOSIS — D500 Iron deficiency anemia secondary to blood loss (chronic): Secondary | ICD-10-CM | POA: Diagnosis not present

## 2017-04-21 DIAGNOSIS — E119 Type 2 diabetes mellitus without complications: Secondary | ICD-10-CM | POA: Diagnosis not present

## 2017-04-21 DIAGNOSIS — R2689 Other abnormalities of gait and mobility: Secondary | ICD-10-CM | POA: Diagnosis not present

## 2017-04-21 DIAGNOSIS — R4182 Altered mental status, unspecified: Secondary | ICD-10-CM | POA: Diagnosis not present

## 2017-04-21 DIAGNOSIS — E7849 Other hyperlipidemia: Secondary | ICD-10-CM | POA: Diagnosis not present

## 2017-04-21 DIAGNOSIS — G20 Parkinson's disease: Secondary | ICD-10-CM | POA: Diagnosis not present

## 2017-04-21 DIAGNOSIS — G9341 Metabolic encephalopathy: Secondary | ICD-10-CM | POA: Diagnosis not present

## 2017-04-21 DIAGNOSIS — E1140 Type 2 diabetes mellitus with diabetic neuropathy, unspecified: Secondary | ICD-10-CM | POA: Diagnosis not present

## 2017-04-21 DIAGNOSIS — F339 Major depressive disorder, recurrent, unspecified: Secondary | ICD-10-CM | POA: Diagnosis not present

## 2017-04-21 DIAGNOSIS — N179 Acute kidney failure, unspecified: Secondary | ICD-10-CM | POA: Diagnosis not present

## 2017-04-21 DIAGNOSIS — G4733 Obstructive sleep apnea (adult) (pediatric): Secondary | ICD-10-CM | POA: Diagnosis not present

## 2017-04-21 DIAGNOSIS — D509 Iron deficiency anemia, unspecified: Secondary | ICD-10-CM | POA: Diagnosis not present

## 2017-04-21 LAB — CBC
HCT: 24.6 % — ABNORMAL LOW (ref 36.0–46.0)
Hemoglobin: 7.5 g/dL — ABNORMAL LOW (ref 12.0–15.0)
MCH: 20.5 pg — AB (ref 26.0–34.0)
MCHC: 30.5 g/dL (ref 30.0–36.0)
MCV: 67.4 fL — ABNORMAL LOW (ref 78.0–100.0)
Platelets: 250 10*3/uL (ref 150–400)
RBC: 3.65 MIL/uL — ABNORMAL LOW (ref 3.87–5.11)
RDW: 17.2 % — AB (ref 11.5–15.5)
WBC: 6.1 10*3/uL (ref 4.0–10.5)

## 2017-04-21 LAB — PROTIME-INR
INR: 2.09
PROTHROMBIN TIME: 23.3 s — AB (ref 11.4–15.2)

## 2017-04-21 LAB — BASIC METABOLIC PANEL
Anion gap: 8 (ref 5–15)
BUN: 17 mg/dL (ref 6–20)
CHLORIDE: 99 mmol/L — AB (ref 101–111)
CO2: 32 mmol/L (ref 22–32)
CREATININE: 0.81 mg/dL (ref 0.44–1.00)
Calcium: 8.8 mg/dL — ABNORMAL LOW (ref 8.9–10.3)
GLUCOSE: 101 mg/dL — AB (ref 65–99)
Potassium: 3.5 mmol/L (ref 3.5–5.1)
SODIUM: 139 mmol/L (ref 135–145)

## 2017-04-21 LAB — GLUCOSE, CAPILLARY
GLUCOSE-CAPILLARY: 134 mg/dL — AB (ref 65–99)
Glucose-Capillary: 127 mg/dL — ABNORMAL HIGH (ref 65–99)

## 2017-04-21 MED ORDER — METOPROLOL TARTRATE 25 MG PO TABS: 25.0000 mg | ORAL_TABLET | Freq: Two times a day (BID) | ORAL | Status: DC

## 2017-04-21 MED ORDER — HYDROCHLOROTHIAZIDE 12.5 MG PO CAPS: 12.5000 mg | ORAL_CAPSULE | Freq: Every day | ORAL | Status: DC

## 2017-04-21 MED ORDER — BOOST / RESOURCE BREEZE PO LIQD: 1.0000 | Freq: Three times a day (TID) | ORAL | 0 refills | Status: DC

## 2017-04-21 MED ORDER — GADOBENATE DIMEGLUMINE 529 MG/ML IV SOLN
20.0000 mL | Freq: Once | INTRAVENOUS | Status: AC
  Administered 2017-04-21: 20 mL via INTRAVENOUS

## 2017-04-21 MED ORDER — WARFARIN SODIUM 7.5 MG PO TABS: 7.5000 mg | ORAL_TABLET | Freq: Once | ORAL | Status: DC

## 2017-04-21 MED ORDER — HYDROCODONE-ACETAMINOPHEN 10-325 MG PO TABS: 1.0000 | ORAL_TABLET | Freq: Two times a day (BID) | ORAL | 0 refills | Status: DC | PRN

## 2017-04-21 NOTE — Clinical Social Work Note (Addendum)
Per MD, patient will likely discharge to SNF today. CSW notified SNF and faxed clinicals to [facility] for insurance authorization review.  Dayton Scrape, CSW [phone]  11:59 am CSW confirmed [facility] received the clinicals.  Dayton Scrape, CSW [phone]  12:17 pm CSW called and notified patient's daughter of plan for discharge to [facility] once insurance authorization obtained. CSW reminded her to take the cpap machine to the facility.  Dayton Scrape, [place] [phone]  1:00 pm Insurance authorization approved: [id number], rug level RVB for 568 minutes per week.  Dayton Scrape, CSW [phone]  3:01 pm CSW spoke with patient's daughter at 1:46 pm to notify that authorization had been obtained. CSW asked if I could let patient's sister know plan because based on discussion earlier this morning, she was not aware that patient was going to SNF. Patient's daughter said she had not updated her yet but would do so at that time. Transport has just arrived and patient's sister is still not aware of plan for SNF. CSW called patient's daughter and asked about it. She said she texted the patient's sister but will call her now. RN updated.  Dayton Scrape, [place]

## 2017-04-21 NOTE — Discharge Summary (Addendum)
PATIENT DETAILS Name: Michele [name] Age: 66 y.o. Sex: female Date of Birth: 1951/06/16 MRN: [id number]. Admitting Physician: Rise Patience, MD TOI:[id number], Bailey Mech, MD  Admit Date: 04/11/2017 Discharge date: 04/21/2017  Recommendations for Outpatient Follow-up:  1. Follow up with PCP in 1-2 weeks 2. Please obtain BMP/CBC in one week 3. On Coumadin-please continue to check INR periodically and adjust dosing of Coumadin.  Admitted From:  Home  Disposition: SNF   Home Health: No  Equipment/Devices: None   Discharge Condition: Stable  CODE STATUS: FULL CODE  Diet recommendation:  Heart Healthy / Carb Modified  Brief Summary: See H&P, Labs, Consult and Test reports for all details in brief, Patient is a 66 y.o. female past medical history of paroxysmal atrial fibrillation on anticoagulation, history of CAD status post remote PCI, anemia of chronic disease admitted with encephalopathy in a setting of acute renal failure and herpes zoster infection. 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 Lumbar puncture was never pursued-as patient probably was on anticoagulation. Case discussed with infectious disease-Dr. Lucianne Lei dam 10/3, he recommended a MRI of the brain-this was subsequently performed on 10/4, no evidence of herpes zoster infection. Case was subsequently again discussed with infectious disease-Dr. Lucianne Lei dam over the phone on 10/4-since low clinical probability-and since MRI is clean-he does not recommend further antiviral therapy. Subsequently spoke with patient and her sister at bedside, explained above, they understand that she will not be discharged on antiviral therapy at this time. Suspect that encephalopathy was mostly metabolic-and she has rapidly improved and  is back to her usual baseline-at the time of discharge (completely awake alert and oriented 3)  Acute kidney injury: Likely hemodynamically mediated-resolved with supportive care.  Rhabdomyolysis: Resolved with supportive care.  Paroxysmal atrial fibrillation: Rate controlled with metoprolol, and Coumadin-INR therapeutic. Please continue to check INR periodically while at SNF and adjust Coumadin dosing accordingly.  History of iron deficiency Anemia: This appears stable-probably worsened by acute illness-follow CBC periodically. No evidence of blood loss. Prior iron panel in March 2018 shows a significantly elevated ferritin.  Herpes zoster to right flank/below right breast area:  no active vesicles seen this morning-all prior lesions have crusted. Has completed antiviral therapy-does not need to be on further antiviral therapy at this time. See above.  Hypertension: Controlled, continue metoprolol and HCTZ. She previously was on Imdur, irbesartan, Cardizem-all of these are currently on hold-please reassess at next visit whether these need to be resumed.  DM-2: CBGs were stable with SSI-metformin will be resumed on discharge.    History of major depressive disorder: Mood appears to be stable, continue Zoloft, Cogentin and Abilify  OSA/history of asthma/chronic hypoxemic respiratory failure: Resume CPAP and oxygen on discharge.  Procedures/Studies: None  Discharge Diagnoses:  Principal Problem:   AKI (acute kidney injury) ([place]) Active Problems:   Obstructive sleep apnea   Essential hypertension   Coronary atherosclerosis   Asthma, mild intermittent   Diabetes mellitus, type II (HCC)   Anemia, iron deficiency   Paroxysmal A-fib (HCC)   AF (paroxysmal atrial fibrillation) (HCC)   Anticoagulated   Hyperkalemia   Rhabdomyolysis   ARF (acute renal failure) ([place])   Discharge Instructions:  Activity:  As tolerated with Full fall precautions use walker/cane & assistance as  needed   Discharge Instructions    Diet - low sodium heart  healthy    Complete by:  As directed    Increase activity slowly    Complete by:  As directed      Allergies as of 04/21/2017      Reactions   Meprobamate Nausea And Vomiting      Medication List    STOP taking these medications   diltiazem 180 MG 24 hr capsule Commonly known as:  CARDIZEM CD   fexofenadine 60 MG tablet Commonly known as:  ALLEGRA   furosemide 40 MG tablet Commonly known as:  LASIX   irbesartan 150 MG tablet Commonly known as:  AVAPRO   irbesartan-hydrochlorothiazide 150-12.5 MG tablet Commonly known as:  AVALIDE   isosorbide mononitrate 30 MG 24 hr tablet Commonly known as:  IMDUR     TAKE these medications   ADVAIR DISKUS 250-50 MCG/DOSE Aepb Generic drug:  Fluticasone-Salmeterol INHALE ONE PUFF TWICE DAILY   albuterol (2.5 MG/3ML) 0.083% nebulizer solution Commonly known as:  PROVENTIL Take 3 mLs (2.5 mg total) by nebulization every 6 (six) hours as needed.   albuterol 108 (90 Base) MCG/ACT inhaler Commonly known as:  VENTOLIN HFA Inhale 1 puff into the lungs every 6 (six) hours as needed for wheezing or shortness of breath.   ARIPiprazole 5 MG tablet Commonly known as:  ABILIFY TAKE ONE (1) TABLET BY MOUTH EVERY DAY What changed:  how much to take  how to take this  when to take this  additional instructions   aspirin EC 81 MG tablet Take 81 mg by mouth daily.   benztropine 0.5 MG tablet Commonly known as:  COGENTIN Take 1 tablet (0.5 mg total) by mouth at bedtime.   feeding supplement Liqd Take 1 Container by mouth 3 (three) times daily between meals.   gabapentin 300 MG capsule Commonly known as:  NEURONTIN Take 300 mg by mouth at bedtime.   hydrochlorothiazide 12.5 MG capsule Commonly known as:  MICROZIDE Take 1 capsule (12.5 mg total) by mouth daily.   HYDROcodone-acetaminophen 10-325 MG tablet Commonly known as:  NORCO Take 1 tablet by mouth 2 (two)  times daily as needed (pain).   metFORMIN 500 MG 24 hr tablet Commonly known as:  GLUCOPHAGE-XR TAKE ONE (1) TABLET BY MOUTH EVERY DAY WITH BREAKFAST What changed:  See the new instructions.   metoprolol tartrate 25 MG tablet Commonly known as:  LOPRESSOR Take 1 tablet (25 mg total) by mouth 2 (two) times daily. What changed:  medication strength  See the new instructions.   mometasone 50 MCG/ACT nasal spray Commonly known as:  NASONEX Place 2 sprays into the nose daily.   nitroGLYCERIN 0.4 MG SL tablet Commonly known as:  NITROSTAT Place 1 tablet (0.4 mg total) under the tongue every 5 (five) minutes as needed. What changed:  reasons to take this   OXYGEN Inhale 4 L into the lungs continuous.   PRESCRIPTION MEDICATION Inhale into the lungs at bedtime. CPAP   rosuvastatin 10 MG tablet Commonly known as:  CRESTOR TAKE ONE (1) TABLET BY MOUTH EVERY DAY What changed:  See the new instructions.   sertraline 100 MG tablet Commonly known as:  ZOLOFT TAKE TWO (2) TABLETS BY MOUTH DAILY What changed:  how much to take  how to take this  when to take this  additional instructions   Trospium Chloride 60 MG Cp24 TAKE ONE CAPSULE BY MOUTH DAILY   warfarin 7.5 MG tablet Commonly known as:  COUMADIN TAKE ONE AND ONE-HALF (1 & 1/2) TO TWO (2) TABLETS  BY MOUTH DAILY AS DIRECTED What changed:  See the new instructions.       Contact information for follow-up providers    [name], MD Follow up.   Specialty:  Internal Medicine Contact information: [address] [place] [address] [phone]            Contact information for after-discharge care    [facility] Follow up.   Specialty:  Skilled Nursing Facility Contact information: [address] [address] [phone]                 Allergies  Allergen Reactions  . Meprobamate Nausea And Vomiting     Consultations:   None  Other Procedures/Studies: Dg Chest 2 View  Result Date: 04/11/2017 CLINICAL DATA:  Altered mental status EXAM: CHEST  2 VIEW COMPARISON:  05/03/2016 FINDINGS: Low lung volumes. The cardio pericardial silhouette is enlarged. There is pulmonary vascular congestion without overt pulmonary edema. The lungs are clear without focal pneumonia, edema, pneumothorax or pleural effusion. IMPRESSION: Low volume film with cardiomegaly and pulmonary vascular congestion. Electronically Signed   By: Misty Stanley M.D.   On: 04/11/2017 20:23   Dg Pelvis 1-2 Views  Result Date: 04/12/2017 CLINICAL DATA:  66 year old with confusion who fell earlier today. Initial encounter. EXAM: PELVIS - 1-2 VIEW COMPARISON:  Visualized pelvis on abdominal x-rays 11/27/2006. FINDINGS: No acute fractures identified involving the pelvis or either proximal femur. Hip joints anatomically aligned with well-preserved joint spaces for age. Sacroiliac joints and symphysis pubis intact. Degenerative changes involving the visualized lower lumbar spine. IMPRESSION: No acute osseous abnormality. Electronically Signed   By: Evangeline Dakin M.D.   On: 04/12/2017 10:04   Ct Head Wo Contrast  Result Date: 04/11/2017 CLINICAL DATA:  66 year old female with altered level of consciousness. EXAM: CT HEAD WITHOUT CONTRAST TECHNIQUE: Contiguous axial images were obtained from the base of the skull through the vertex without intravenous contrast. COMPARISON:  05/15/2015 brain MR and 04/28/2015 head CT a FINDINGS: Brain: No evidence of acute infarction, hemorrhage, hydrocephalus, extra-axial collection or mass lesion/mass effect. Chronic small-vessel white matter ischemic changes and remote left basal ganglia infarct again noted. Vascular: No hyperdense vessel or unexpected calcification. Skull: Normal. Negative for fracture or focal lesion. Sinuses/Orbits: No acute finding. Other: None. IMPRESSION: 1. No evidence of acute  intracranial abnormality 2. Chronic small-vessel white matter ischemic changes and remote left basal ganglia infarct. Electronically Signed   By: Margarette Canada M.D.   On: 04/11/2017 17:20   Mr Jeri Cos ZH Contrast  Result Date: 04/21/2017 CLINICAL DATA:  Metabolic encephalopathy. Renal failure. Zoster infection. EXAM: MRI HEAD WITHOUT AND WITH CONTRAST TECHNIQUE: Multiplanar, multiecho pulse sequences of the brain and surrounding structures were obtained without and with intravenous contrast. CONTRAST:  74m MULTIHANCE GADOBENATE DIMEGLUMINE 529 MG/ML IV SOLN COMPARISON:  CT 04/11/2017.  MRI 05/15/2015. FINDINGS: Brain: Diffusion imaging does not show any acute or subacute infarction. There are mild chronic small-vessel ischemic changes of the pons. No focal cerebellar insult. Cerebral hemispheres show pronounced chronic small-vessel ischemic changes affecting the thalami, basal ganglia and hemispheric deep white matter. No cortical or large vessel territory insult. No mass lesion, hemorrhage, hydrocephalus or extra-axial collection. After contrast administration, no abnormal enhancement occurs. Vascular: Major vessels at the base of the brain show flow. Skull and upper cervical spine: Negative Sinuses/Orbits: Clear/normal Other: None IMPRESSION: No acute or reversible finding. No evidence of herpes infection. Chronic small-vessel ischemic  changes throughout the brain. Electronically Signed   By: Nelson Chimes M.D.   On: 04/21/2017 11:21      TODAY-DAY OF DISCHARGE:  Subjective:   Michele [name] today has no headache,no chest abdominal pain,no new weakness tingling or numbness, feels much better wants to go home today.  Objective:   Blood pressure (!) 142/77, pulse 73, temperature 97.7 F (36.5 C), temperature source Oral, resp. rate 18, height 5' 2"  (1.575 m), weight 119.5 kg (263 lb 6.4 oz), SpO2 100 %.  Intake/Output Summary (Last 24 hours) at 04/21/17 1202 Last data filed at 04/21/17 0955   Gross per 24 hour  Intake             1827 ml  Output              500 ml  Net             1327 ml   Filed Weights   04/19/17 0638 04/20/17 0653 04/21/17 0551  Weight: 119.6 kg (263 lb 9.6 oz) 124.7 kg (275 lb) 119.5 kg (263 lb 6.4 oz)    Exam: Awake Alert, Oriented *3, No new F.N deficits, Normal affect Baker.AT,PERRAL Supple Neck,No JVD, No cervical lymphadenopathy appriciated.  Symmetrical Chest wall movement, Good air movement bilaterally, CTAB RRR,No Gallops,Rubs or new Murmurs, No Parasternal Heave +ve B.Sounds, Abd Soft, Non tender, No organomegaly appriciated, No rebound -guarding or rigidity. No Cyanosis, Clubbing or edema, No new Rash or bruise   PERTINENT RADIOLOGIC STUDIES: Dg Chest 2 View  Result Date: 04/11/2017 CLINICAL DATA:  Altered mental status EXAM: CHEST  2 VIEW COMPARISON:  05/03/2016 FINDINGS: Low lung volumes. The cardio pericardial silhouette is enlarged. There is pulmonary vascular congestion without overt pulmonary edema. The lungs are clear without focal pneumonia, edema, pneumothorax or pleural effusion. IMPRESSION: Low volume film with cardiomegaly and pulmonary vascular congestion. Electronically Signed   By: Misty Stanley M.D.   On: 04/11/2017 20:23   Dg Pelvis 1-2 Views  Result Date: 04/12/2017 CLINICAL DATA:  66 year old with confusion who fell earlier today. Initial encounter. EXAM: PELVIS - 1-2 VIEW COMPARISON:  Visualized pelvis on abdominal x-rays 11/27/2006. FINDINGS: No acute fractures identified involving the pelvis or either proximal femur. Hip joints anatomically aligned with well-preserved joint spaces for age. Sacroiliac joints and symphysis pubis intact. Degenerative changes involving the visualized lower lumbar spine. IMPRESSION: No acute osseous abnormality. Electronically Signed   By: Evangeline Dakin M.D.   On: 04/12/2017 10:04   Ct Head Wo Contrast  Result Date: 04/11/2017 CLINICAL DATA:  66 year old female with altered level of  consciousness. EXAM: CT HEAD WITHOUT CONTRAST TECHNIQUE: Contiguous axial images were obtained from the base of the skull through the vertex without intravenous contrast. COMPARISON:  05/15/2015 brain MR and 04/28/2015 head CT a FINDINGS: Brain: No evidence of acute infarction, hemorrhage, hydrocephalus, extra-axial collection or mass lesion/mass effect. Chronic small-vessel white matter ischemic changes and remote left basal ganglia infarct again noted. Vascular: No hyperdense vessel or unexpected calcification. Skull: Normal. Negative for fracture or focal lesion. Sinuses/Orbits: No acute finding. Other: None. IMPRESSION: 1. No evidence of acute intracranial abnormality 2. Chronic small-vessel white matter ischemic changes and remote left basal ganglia infarct. Electronically Signed   By: Margarette Canada M.D.   On: 04/11/2017 17:20   Mr Jeri Cos MG Contrast  Result Date: 04/21/2017 CLINICAL DATA:  Metabolic encephalopathy. Renal failure. Zoster infection. EXAM: MRI HEAD WITHOUT AND WITH CONTRAST TECHNIQUE: Multiplanar, multiecho pulse sequences of the brain and surrounding  structures were obtained without and with intravenous contrast. CONTRAST:  53m MULTIHANCE GADOBENATE DIMEGLUMINE 529 MG/ML IV SOLN COMPARISON:  CT 04/11/2017.  MRI 05/15/2015. FINDINGS: Brain: Diffusion imaging does not show any acute or subacute infarction. There are mild chronic small-vessel ischemic changes of the pons. No focal cerebellar insult. Cerebral hemispheres show pronounced chronic small-vessel ischemic changes affecting the thalami, basal ganglia and hemispheric deep white matter. No cortical or large vessel territory insult. No mass lesion, hemorrhage, hydrocephalus or extra-axial collection. After contrast administration, no abnormal enhancement occurs. Vascular: Major vessels at the base of the brain show flow. Skull and upper cervical spine: Negative Sinuses/Orbits: Clear/normal Other: None IMPRESSION: No acute or reversible  finding. No evidence of herpes infection. Chronic small-vessel ischemic changes throughout the brain. Electronically Signed   By: MNelson ChimesM.D.   On: 04/21/2017 11:21     PERTINENT LAB RESULTS: CBC:  Recent Labs  04/20/17 0401 04/21/17 0441  WBC 7.1 6.1  HGB 9.0* 7.5*  HCT 29.1* 24.6*  PLT 301 250   CMET CMP     Component Value Date/Time   NA 139 04/21/2017 0441   NA 144 10/02/2012 0905   K 3.5 04/21/2017 0441   K 3.4 (L) 10/02/2012 0905   CL 99 (L) 04/21/2017 0441   CL 101 10/02/2012 0905   CO2 32 04/21/2017 0441   CO2 31 (H) 10/02/2012 0905   GLUCOSE 101 (H) 04/21/2017 0441   GLUCOSE 107 (H) 10/02/2012 0905   BUN 17 04/21/2017 0441   BUN 13.9 10/02/2012 0905   CREATININE 0.81 04/21/2017 0441   CREATININE 0.77 12/08/2014 1153   CREATININE 0.8 10/02/2012 0905   CALCIUM 8.8 (L) 04/21/2017 0441   CALCIUM 9.9 10/02/2012 0905   PROT 7.6 04/12/2017 0912   ALBUMIN 3.7 04/12/2017 0912   AST 46 (H) 04/12/2017 0912   ALT 25 04/12/2017 0912   ALKPHOS 97 04/12/2017 0912   BILITOT 0.5 04/12/2017 0912   GFRNONAA >60 04/21/2017 0441   GFRNONAA >89 01/08/2014 1204   GFRAA >60 04/21/2017 0441   GFRAA >89 01/08/2014 1204    GFR Estimated Creatinine Clearance: 84 mL/min (by C-G formula based on SCr of 0.81 mg/dL). No results for input(s): LIPASE, AMYLASE in the last 72 hours. No results for input(s): CKTOTAL, CKMB, CKMBINDEX, TROPONINI in the last 72 hours. Invalid input(s): POCBNP No results for input(s): DDIMER in the last 72 hours. No results for input(s): HGBA1C in the last 72 hours. No results for input(s): CHOL, HDL, LDLCALC, TRIG, CHOLHDL, LDLDIRECT in the last 72 hours. No results for input(s): TSH, T4TOTAL, T3FREE, THYROIDAB in the last 72 hours.  Invalid input(s): FREET3 No results for input(s): VITAMINB12, FOLATE, FERRITIN, TIBC, IRON, RETICCTPCT in the last 72 hours. Coags:  Recent Labs  04/20/17 0401 04/21/17 0441  INR 2.11 2.09   Microbiology: No  results found for this or any previous visit (from the past 240 hour(s)).  FURTHER DISCHARGE INSTRUCTIONS:  Get Medicines reviewed and adjusted: Please take all your medications with you for your next visit with your Primary MD  Laboratory/radiological data: Please request your Primary MD to go over all hospital tests and procedure/radiological results at the follow up, please ask your Primary MD to get all Hospital records sent to his/her office.  In some cases, they will be blood work, cultures and biopsy results pending at the time of your discharge. Please request that your primary care M.D. goes through all the records of your hospital data and follows up on  these results.  Also Note the following: If you experience worsening of your admission symptoms, develop shortness of breath, life threatening emergency, suicidal or homicidal thoughts you must seek medical attention immediately by calling 911 or calling your MD immediately  if symptoms less severe.  You must read complete instructions/literature along with all the possible adverse reactions/side effects for all the Medicines you take and that have been prescribed to you. Take any new Medicines after you have completely understood and accpet all the possible adverse reactions/side effects.   Do not drive when taking Pain medications or sleeping medications (Benzodaizepines)  Do not take more than prescribed Pain, Sleep and Anxiety Medications. It is not advisable to combine anxiety,sleep and pain medications without talking with your primary care practitioner  Special Instructions: If you have smoked or chewed Tobacco  in the last 2 yrs please stop smoking, stop any regular Alcohol  and or any Recreational drug use.  Wear Seat belts while driving.  Please note: You were cared for by a hospitalist during your hospital stay. Once you are discharged, your primary care physician will handle any further medical issues. Please note that  NO REFILLS for any discharge medications will be authorized once you are discharged, as it is imperative that you return to your primary care physician (or establish a relationship with a primary care physician if you do not have one) for your post hospital discharge needs so that they can reassess your need for medications and monitor your lab values.  Total Time spent coordinating discharge including counseling, education and face to face time equals 45 minutes.  Signed: [place] 04/21/2017 12:02 PM

## 2017-04-21 NOTE — H&P (Signed)
Called report to [employer]. Transport here to pick up patient.

## 2017-04-21 NOTE — Clinical Social Work Placement (Signed)
   CLINICAL SOCIAL WORK PLACEMENT  NOTE  Date:  04/21/2017  Patient Details  Name: Michele [name] MRN: [id number] Date of Birth: [birth year]  Clinical Social Work is seeking post-discharge placement for this patient at the [place] level of care (*CSW will initial, date and re-position this form in  chart as items are completed):  Yes   Patient/family provided with [facility] Work Department's list of facilities offering this level of care within the geographic area requested by the patient (or if unable, by the patient's family).      Patient/family informed of their freedom to choose among providers that offer the needed level of care, that participate in Medicare, Medicaid or managed care program needed by the patient, have an available bed and are willing to accept the patient.  Yes   Patient/family informed of [employer] [facility]'s ownership interest in [facility] and [facility], as well as of the fact that they are under no obligation to receive care at these facilities.  PASRR submitted to EDS on 04/18/17     PASRR number received on 04/19/17     Existing PASRR number confirmed on       FL2 transmitted to all facilities in geographic area requested by pt/family on 04/19/17     FL2 transmitted to all facilities within larger geographic area on       Patient informed that his/her managed care company has contracts with or will negotiate with certain facilities, including the following:        Yes   Patient/family informed of bed offers received.  Patient chooses bed at [facility]     Physician recommends and patient chooses bed at      Patient to be transferred to [facility] on 04/21/17.  Patient to be transferred to facility by [facility]     Patient family notified on 04/21/17 of transfer.  Name of family member notified:  [name]     PHYSICIAN       Additional Comment:     _______________________________________________ Candie Chroman, LCSW 04/21/2017, 1:56 PM

## 2017-04-21 NOTE — Progress Notes (Signed)
Pt assessed with MD, no visible lesions on patient at this time.

## 2017-04-21 NOTE — Progress Notes (Signed)
ANTICOAGULATION CONSULT NOTE - Follow Up Consult  Pharmacy Consult for Coumadin Indication: atrial fibrillation  Allergies  Allergen Reactions  . Meprobamate Nausea And Vomiting   Patient Measurements: Height: 5\' 2"  (157.5 cm) Weight: 263 lb 6.4 oz (119.5 kg) (scale c) IBW/kg (Calculated) : 50.1   Vital Signs: Temp: 97.7 F (36.5 C) (10/04 0551) Temp Source: Oral (10/04 0551) BP: 142/77 (10/04 0551) Pulse Rate: 73 (10/04 0551)  Labs:  Recent Labs  04/19/17 0448 04/20/17 0401 04/21/17 0441  HGB 8.5* 9.0* 7.5*  HCT 27.5* 29.1* 24.6*  PLT 283 301 250  LABPROT 26.6* 23.5* 23.3*  INR 2.48 2.11 2.09  CREATININE  --   --  0.81   Estimated Creatinine Clearance: 84 mL/min (by C-G formula based on SCr of 0.81 mg/dL).  Assessment:  Anticoag: 89 yof has been on warfarin PTA for atrial fibrillation.  PTA: 11.25 daily except 7.5 mg MF as of anti-coag visit on 9/21, INR 3.45 on admit  INR trending down but remains therapeutic 2.09, given larger dose last night; Hgb dec 9 > 7.5, no overt bleeding or complications documented.  Goal of Therapy:  INR 2-3 Monitor platelets by anticoagulation protocol: Yes   Plan:  Warfarin 7.5 mg x 1 tonight. Daily INR Monitor for s/sx of bleeding  Uvaldo Rising, BCPS  Clinical Pharmacist Pager [phone]  04/21/2017 10:29 AM

## 2017-04-21 NOTE — Clinical Social Work Note (Signed)
CSW facilitated patient discharge including contacting patient family and facility to confirm patient discharge plans. Clinical information faxed to facility and family agreeable with plan. CSW arranged ambulance transport via [facility] to [facility]. RN to call report prior to discharge ([phone]).  CSW will sign off for now as social work intervention is no longer needed. Please consult us again if new needs arise.  Iann Rodier, CSW [phone]   

## 2017-04-22 DIAGNOSIS — I10 Essential (primary) hypertension: Secondary | ICD-10-CM | POA: Diagnosis not present

## 2017-04-22 DIAGNOSIS — I480 Paroxysmal atrial fibrillation: Secondary | ICD-10-CM | POA: Diagnosis not present

## 2017-04-22 DIAGNOSIS — D500 Iron deficiency anemia secondary to blood loss (chronic): Secondary | ICD-10-CM | POA: Diagnosis not present

## 2017-04-22 DIAGNOSIS — E119 Type 2 diabetes mellitus without complications: Secondary | ICD-10-CM | POA: Diagnosis not present

## 2017-04-28 ENCOUNTER — Ambulatory Visit: Payer: Self-pay | Admitting: Oncology

## 2017-04-28 ENCOUNTER — Other Ambulatory Visit: Payer: Self-pay

## 2017-04-28 ENCOUNTER — Ambulatory Visit (HOSPITAL_COMMUNITY): Payer: Self-pay | Admitting: Psychology

## 2017-04-28 ENCOUNTER — Other Ambulatory Visit: Payer: Self-pay | Admitting: Cardiovascular Disease

## 2017-05-04 DIAGNOSIS — G4733 Obstructive sleep apnea (adult) (pediatric): Secondary | ICD-10-CM | POA: Diagnosis not present

## 2017-05-04 DIAGNOSIS — J45909 Unspecified asthma, uncomplicated: Secondary | ICD-10-CM | POA: Diagnosis not present

## 2017-05-05 ENCOUNTER — Telehealth: Payer: Self-pay | Admitting: Cardiovascular Disease

## 2017-05-05 NOTE — Telephone Encounter (Signed)
Patient called in b/c she was in the hospital and her warfarin dose was changed and she needs an INR check. Upon chart review, she is also due for an appt with MD - last seen 11/2015. Scheduled to see MD on 10/19 (ok'ed per taylor). Will staff message pharmacy to notify them that patient needs INR check at visit.

## 2017-05-05 NOTE — Telephone Encounter (Signed)
New message    Pt pharmacy is calling. She said pt is out of rehab center and her medications were changed. She would like to talk with Dr. Gwenlyn Found about this.

## 2017-05-06 ENCOUNTER — Encounter: Payer: Self-pay | Admitting: Cardiovascular Disease

## 2017-05-06 ENCOUNTER — Ambulatory Visit (INDEPENDENT_AMBULATORY_CARE_PROVIDER_SITE_OTHER): Payer: Medicare Other | Admitting: Pharmacist Clinician (PhC)/ Clinical Pharmacy Specialist

## 2017-05-06 ENCOUNTER — Ambulatory Visit (INDEPENDENT_AMBULATORY_CARE_PROVIDER_SITE_OTHER): Payer: Medicare Other | Admitting: Cardiovascular Disease

## 2017-05-06 DIAGNOSIS — E1140 Type 2 diabetes mellitus with diabetic neuropathy, unspecified: Secondary | ICD-10-CM | POA: Diagnosis not present

## 2017-05-06 DIAGNOSIS — I480 Paroxysmal atrial fibrillation: Secondary | ICD-10-CM

## 2017-05-06 DIAGNOSIS — I2510 Atherosclerotic heart disease of native coronary artery without angina pectoris: Secondary | ICD-10-CM

## 2017-05-06 DIAGNOSIS — E7800 Pure hypercholesterolemia, unspecified: Secondary | ICD-10-CM

## 2017-05-06 DIAGNOSIS — Z7901 Long term (current) use of anticoagulants: Secondary | ICD-10-CM

## 2017-05-06 DIAGNOSIS — I110 Hypertensive heart disease with heart failure: Secondary | ICD-10-CM | POA: Diagnosis not present

## 2017-05-06 DIAGNOSIS — I10 Essential (primary) hypertension: Secondary | ICD-10-CM

## 2017-05-06 LAB — POCT INR: INR: 1.6

## 2017-05-06 NOTE — Assessment & Plan Note (Signed)
History of essential hypertension blood pressure measured 108/71. She is on irbesartan, hydrochlorothiazide and metoprolol. Continue current meds at current dosing

## 2017-05-06 NOTE — Assessment & Plan Note (Signed)
History of paroxysmal A. fib with sinus rhythm demonstrated during her last hospitalization although she did have some PAF. She is on Coumadin anticoagulation.

## 2017-05-06 NOTE — Progress Notes (Signed)
05/06/2017 Michele [name]   Mar 19, 1951  [id number]  Primary Physician [name], MD Primary Cardiologist: Lorretta Harp MD FACP, [place], [place], [place]  HPI:  Michele [name] is a 66 y.o. female morbidly obese and who has a history of coronary disease status post stent to the proximal RCA in 2008. Her history also includes diabetes mellitus type 2, hypertension, sleep apnea, asthma, paroxysmal intrafibrillation, hyperlipidemia, arthritis. I last saw her in the office 07/24/14. Her last nuclear stress test was 12/20/12 was nonischemic. She has normal LV function.. She has occasional atypical chest pain as well as dyspnea on exertion when walking up an incline.. Since I saw her a year ago she's remained chronically stable without change of her symptoms in either frequency or severity. She does have a history of paroxysmal A. Fib maintaining sinus rhythm on Coumadin anticoagulation. We talked about weight reduction which has been on major problem with her over the years. Unfortunately that she has not been able to lose weight despite seeing a dietitian. Since I saw her over a year ago she's remained clinically stable until September when she was admitted to [facility] with altered mental status and metabolic encephalopathy.   Current Meds  Medication Sig  . ADVAIR DISKUS 250-50 MCG/DOSE AEPB INHALE ONE PUFF TWICE DAILY  . albuterol (PROVENTIL HFA;VENTOLIN HFA) 108 (90 Base) MCG/ACT inhaler Inhale 1 puff into the lungs as directed.  Marland Kitchen albuterol (VENTOLIN HFA) 108 (90 Base) MCG/ACT inhaler Inhale 1 puff into the lungs every 6 (six) hours as needed for wheezing or shortness of breath.  . ARIPiprazole (ABILIFY) 5 MG tablet TAKE ONE (1) TABLET BY MOUTH EVERY DAY (Patient taking differently: Take 5 mg by mouth daily. )  . aspirin EC 81 MG tablet Take 81 mg by mouth daily.  . benztropine (COGENTIN) 0.5 MG tablet Take 1 tablet (0.5 mg total) by mouth at bedtime.  . feeding supplement  (BOOST / RESOURCE BREEZE) LIQD Take 1 Container by mouth 3 (three) times daily between meals.  . gabapentin (NEURONTIN) 300 MG capsule Take 300 mg by mouth at bedtime.  . hydrochlorothiazide (MICROZIDE) 12.5 MG capsule Take 1 capsule (12.5 mg total) by mouth daily.  Marland Kitchen HYDROcodone-acetaminophen (NORCO) 10-325 MG tablet Take 1 tablet by mouth 2 (two) times daily as needed (pain).  . irbesartan-hydrochlorothiazide (AVALIDE) 150-12.5 MG tablet TAKE ONE (1) TABLET BY MOUTH EVERY DAY (NEEDS APPT. FOR MORE REFILLS)  . metFORMIN (GLUCOPHAGE-XR) 500 MG 24 hr tablet TAKE ONE (1) TABLET BY MOUTH EVERY DAY WITH BREAKFAST (Patient taking differently: TAKE ONE (1) TABLET (500 MG) BY MOUTH EVERY DAY WITH BREAKFAST)  . metoprolol tartrate (LOPRESSOR) 25 MG tablet Take 1 tablet (25 mg total) by mouth 2 (two) times daily.  . mometasone (NASONEX) 50 MCG/ACT nasal spray Place 2 sprays into the nose daily.  . nitroGLYCERIN (NITROSTAT) 0.4 MG SL tablet Place 1 tablet (0.4 mg total) under the tongue every 5 (five) minutes as needed. (Patient taking differently: Place 0.4 mg under the tongue every 5 (five) minutes as needed for chest pain. )  . OXYGEN Inhale 4 L into the lungs continuous.  Marland Kitchen PRESCRIPTION MEDICATION Inhale into the lungs at bedtime. CPAP  . rosuvastatin (CRESTOR) 10 MG tablet TAKE ONE (1) TABLET BY MOUTH EVERY DAY (Patient taking differently: TAKE ONE (1) TABLET (10 MG)  BY MOUTH EVERY DAY)  . sertraline (ZOLOFT) 100 MG tablet TAKE TWO (2) TABLETS BY MOUTH DAILY (Patient taking differently: Take 200 mg  by mouth daily. )  . Trospium Chloride 60 MG CP24 TAKE ONE CAPSULE BY MOUTH DAILY  . warfarin (COUMADIN) 7.5 MG tablet TAKE ONE AND ONE-HALF (1 & 1/2) TO TWO (2) TABLETS BY MOUTH DAILY AS DIRECTED (Patient taking differently: TAKE ONE TABLET (7.5 MG) BY MOUTH ON MONDAYS & FRIDAYS, TAKE ONE AND ONE-HALF TABLET (11.25 MG) ON ALL OTHER DAYS OF THE WEEK)     Allergies  Allergen Reactions  . Meprobamate Nausea  And Vomiting    Social History   Social History  . Marital status: Divorced    Spouse name: N/A  . Number of children: 2  . Years of education: 14   Occupational History  . 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 Smoking status: Never Smoker  . Smokeless tobacco: Never Used  . Alcohol use No  . Drug use: No  . Sexual activity: Not Currently   Other Topics Concern  . Not on file   Social History Narrative   Lives at home alone.   Right-handed.   Occasional caffeine use.   Retired [employer] since 2014. (Rehab).       Review of Systems: General: negative for chills, fever, night sweats or weight changes.  Cardiovascular: negative for chest pain, dyspnea on exertion, edema, orthopnea, palpitations, paroxysmal nocturnal dyspnea or shortness of breath Dermatological: negative for rash Respiratory: negative for cough or wheezing Urologic: negative for hematuria Abdominal: negative for nausea, vomiting, diarrhea, bright red blood per rectum, melena, or hematemesis Neurologic: negative for visual changes, syncope, or dizziness All other systems reviewed and are otherwise negative except as noted above.    Blood pressure 108/71, pulse 63, height 5' 2"  (1.575 m), weight 277 lb (125.6 kg), SpO2 98 %.  General appearance: alert and no distress Neck: no adenopathy, no carotid bruit, no JVD, supple, symmetrical, trachea midline and thyroid not enlarged, symmetric, no tenderness/mass/nodules Lungs: clear to auscultation bilaterally Heart: regular rate and rhythm, S1, S2 normal, no murmur, click, rub or gallop Extremities: extremities normal, atraumatic, no cyanosis or edema Pulses: 2+ and symmetric Skin: Skin color, texture, turgor normal. No rashes or lesions Neurologic: Alert and oriented X 3, normal strength and tone. Normal symmetric reflexes. Normal coordination and gait  EKG not performed today.  ASSESSMENT AND PLAN:   Essential  hypertension History of essential hypertension blood pressure measured 108/71. She is on irbesartan, hydrochlorothiazide and metoprolol. Continue current meds at current dosing  Coronary atherosclerosis History of CAD status post RCA stenting in 2008. Her last stress test performed 12/20/12 was nonischemic. She denies chest pain or shortness of breath.  Paroxysmal A-fib (HCC) History of paroxysmal A. fib with sinus rhythm demonstrated during her last hospitalization although she did have some PAF. She is on Coumadin anticoagulation.  Hyperlipidemia History of hyperlipidemia on statin therapy followed by her PCP      Lorretta Harp MD [facility], [facility] 05/06/2017 11:09 AM

## 2017-05-06 NOTE — Patient Instructions (Signed)
Medication Instructions: Your physician recommends that you continue on your current medications as directed. Please refer to the Current Medication list given to you today.  Follow-Up: We request that you follow-up in: 6 months with an extender and in 12 months with Dr Andria Rhein will receive a reminder letter in the mail two months in advance. If you don't receive a letter, please call our office to schedule the follow-up appointment.  If you need a refill on your cardiac medications before your next appointment, please call your pharmacy.

## 2017-05-06 NOTE — Assessment & Plan Note (Signed)
History of hyperlipidemia on statin therapy followed by her PCP. 

## 2017-05-06 NOTE — Assessment & Plan Note (Signed)
History of CAD status post RCA stenting in 2008. Her last stress test performed 12/20/12 was nonischemic. She denies chest pain or shortness of breath.

## 2017-05-11 DIAGNOSIS — D649 Anemia, unspecified: Secondary | ICD-10-CM | POA: Diagnosis not present

## 2017-05-11 DIAGNOSIS — E039 Hypothyroidism, unspecified: Secondary | ICD-10-CM | POA: Diagnosis not present

## 2017-05-11 DIAGNOSIS — I4891 Unspecified atrial fibrillation: Secondary | ICD-10-CM | POA: Diagnosis not present

## 2017-05-11 DIAGNOSIS — Z09 Encounter for follow-up examination after completed treatment for conditions other than malignant neoplasm: Secondary | ICD-10-CM | POA: Diagnosis not present

## 2017-05-11 DIAGNOSIS — G9340 Encephalopathy, unspecified: Secondary | ICD-10-CM | POA: Diagnosis not present

## 2017-05-12 ENCOUNTER — Ambulatory Visit (INDEPENDENT_AMBULATORY_CARE_PROVIDER_SITE_OTHER): Payer: No Typology Code available for payment source | Admitting: Psychology

## 2017-05-12 ENCOUNTER — Telehealth: Payer: Self-pay | Admitting: Cardiovascular Disease

## 2017-05-12 DIAGNOSIS — F4310 Post-traumatic stress disorder, unspecified: Secondary | ICD-10-CM | POA: Diagnosis not present

## 2017-05-12 DIAGNOSIS — F329 Major depressive disorder, single episode, unspecified: Secondary | ICD-10-CM

## 2017-05-12 DIAGNOSIS — F331 Major depressive disorder, recurrent, moderate: Secondary | ICD-10-CM

## 2017-05-12 MED ORDER — METOPROLOL TARTRATE 25 MG PO TABS: 25.0000 mg | ORAL_TABLET | Freq: Two times a day (BID) | ORAL | Status: DC

## 2017-05-12 NOTE — Telephone Encounter (Signed)
Spoke with Michele [name] he states that he needs a new rx from Dr Gwenlyn Found  Since they changed the dose when she was at the rehab facility. Reviewed note and per note dated 05-06-17: She is on irbesartan, hydrochlorothiazide and metoprolol. Continue current meds at current dosing,  New rx sent.

## 2017-05-12 NOTE — Telephone Encounter (Signed)
Michele [name]( [place]) is calling because  Mrs. Babe  was in a Rehab facility and they changed some of her Medication and she is wanting to verify if he wants that done ? Please call

## 2017-05-12 NOTE — Progress Notes (Signed)
   THERAPIST PROGRESS NOTE  Session Time: 12.30pm-1.05pm  Participation Level: Active  Behavioral Response: Well GroomedAlertAnxious  Type of Therapy: Individual Therapy  Treatment Goals addressed: Diagnosis: MDD, PTSD and goal 1  Interventions: CBT and Supportive  Summary: Michele [name] is a 66 y.o. female who presents with affect congruent w/ report of anxiety.  Pt discussed recent stressors of being in the hospital and then rehab center for 1.5 weeks.  Pt reported that there are a couple of days she has no memory re: and hearing how she was acting is surprising to her.  Pt was able to increase awareness of this not being unusually given medical issues she was dealing w/. Pt reported she has felt more anxious- "can't put finger on why".  Daughter called during session to remind pt that home nurse coming at 1:30pm and needs to be home. Pt reported that she is forgetting things and feels family isn't understanding of this. Pt expressed understanding about transition to new therapist and expressed will miss this therapeutic relationship.   Suicidal/Homicidal: Nowithout intent/plan  Therapist Response: Assessed pt current functioning per pt report.  Processed w/pt anxiety- validated and normalized for transitions and stressors of not remembering things well.  Discussed w/pt utilizing supports to assist w/ keeping appointments and things to do straight and no negative self talk about.  Discussed need for transition to LCSW due to credentialing issues w/ medicare.    Plan: Return again in 2 weeks. Rescheduled appointment w/ Dr. Adele Schilder that missed while in hosptial.   Diagnosis: MDD, PTSD   [name],[name], [facility] 05/12/2017

## 2017-05-17 ENCOUNTER — Other Ambulatory Visit: Payer: Self-pay | Admitting: Cardiovascular Disease

## 2017-05-17 ENCOUNTER — Ambulatory Visit (INDEPENDENT_AMBULATORY_CARE_PROVIDER_SITE_OTHER): Payer: Medicare Other | Admitting: Pharmacist

## 2017-05-17 DIAGNOSIS — I480 Paroxysmal atrial fibrillation: Secondary | ICD-10-CM | POA: Diagnosis not present

## 2017-05-17 DIAGNOSIS — Z7901 Long term (current) use of anticoagulants: Secondary | ICD-10-CM

## 2017-05-17 LAB — POCT INR: INR: 1.1

## 2017-05-18 ENCOUNTER — Telehealth: Payer: Self-pay | Admitting: *Deleted

## 2017-05-18 NOTE — Telephone Encounter (Signed)
Message from pt requesting to schedule a follow up visit.  Message to schedulers to call pt with appt. (Missed appt this month due to hospitalization and rehab.) Returned call to pt, she understands schedulers will contact her with new appt.

## 2017-05-18 NOTE — Telephone Encounter (Signed)
REFILL 

## 2017-05-19 ENCOUNTER — Telehealth: Payer: Self-pay | Admitting: Oncology

## 2017-05-19 DIAGNOSIS — H40013 Open angle with borderline findings, low risk, bilateral: Secondary | ICD-10-CM | POA: Diagnosis not present

## 2017-05-19 DIAGNOSIS — H2513 Age-related nuclear cataract, bilateral: Secondary | ICD-10-CM | POA: Diagnosis not present

## 2017-05-19 DIAGNOSIS — H1851 Endothelial corneal dystrophy: Secondary | ICD-10-CM | POA: Diagnosis not present

## 2017-05-19 DIAGNOSIS — E119 Type 2 diabetes mellitus without complications: Secondary | ICD-10-CM | POA: Diagnosis not present

## 2017-05-19 NOTE — Telephone Encounter (Signed)
Patient called to reschedule appointment. Patient scheduled per 10/31 sch message.

## 2017-05-20 ENCOUNTER — Ambulatory Visit (INDEPENDENT_AMBULATORY_CARE_PROVIDER_SITE_OTHER): Payer: Medicare Other | Admitting: Pharmacist Clinician (PhC)/ Clinical Pharmacy Specialist

## 2017-05-20 DIAGNOSIS — Z7901 Long term (current) use of anticoagulants: Secondary | ICD-10-CM

## 2017-05-20 DIAGNOSIS — I480 Paroxysmal atrial fibrillation: Secondary | ICD-10-CM | POA: Diagnosis not present

## 2017-05-20 LAB — POCT INR: INR: 1.1

## 2017-05-25 ENCOUNTER — Telehealth: Payer: Self-pay | Admitting: Pharmacist

## 2017-05-25 ENCOUNTER — Ambulatory Visit (INDEPENDENT_AMBULATORY_CARE_PROVIDER_SITE_OTHER): Payer: Medicare Other | Admitting: Psychiatry

## 2017-05-25 ENCOUNTER — Encounter (HOSPITAL_COMMUNITY): Payer: Self-pay | Admitting: Psychiatry

## 2017-05-25 DIAGNOSIS — F331 Major depressive disorder, recurrent, moderate: Secondary | ICD-10-CM

## 2017-05-25 DIAGNOSIS — Z818 Family history of other mental and behavioral disorders: Secondary | ICD-10-CM | POA: Diagnosis not present

## 2017-05-25 MED ORDER — ARIPIPRAZOLE 5 MG PO TABS: 5.0000 mg | ORAL_TABLET | Freq: Every day | ORAL | 0 refills | Status: DC

## 2017-05-25 MED ORDER — BENZTROPINE MESYLATE 0.5 MG PO TABS: 0.5000 mg | ORAL_TABLET | Freq: Every day | ORAL | 0 refills | Status: DC

## 2017-05-25 MED ORDER — SERTRALINE HCL 100 MG PO TABS: ORAL_TABLET | ORAL | 0 refills | Status: DC
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
## 2017-05-25 NOTE — Telephone Encounter (Signed)
RN was unable to get enough blood from patient for PT/INR.    Orders given to try again tomorrow after proper hydration or call back for coumadin clinic f/u on Friday at 9:30am

## 2017-05-26 DIAGNOSIS — R791 Abnormal coagulation profile: Secondary | ICD-10-CM | POA: Diagnosis not present

## 2017-05-26 LAB — PROTIME-INR: INR: 1.1 (ref 0.9–1.1)

## 2017-05-27 ENCOUNTER — Other Ambulatory Visit (HOSPITAL_BASED_OUTPATIENT_CLINIC_OR_DEPARTMENT_OTHER): Payer: Medicare Other

## 2017-05-27 ENCOUNTER — Telehealth: Payer: Self-pay | Admitting: Cardiovascular Disease

## 2017-05-27 ENCOUNTER — Ambulatory Visit (INDEPENDENT_AMBULATORY_CARE_PROVIDER_SITE_OTHER): Payer: Medicare Other | Admitting: Pharmacist

## 2017-05-27 ENCOUNTER — Ambulatory Visit: Payer: Medicare Other | Admitting: Oncology

## 2017-05-27 ENCOUNTER — Telehealth: Payer: Self-pay | Admitting: Internal Medicine

## 2017-05-27 VITALS — BP 108/66 | HR 61 | Temp 98.6°F | Resp 18 | Ht 62.0 in | Wt 286.4 lb

## 2017-05-27 DIAGNOSIS — D508 Other iron deficiency anemias: Secondary | ICD-10-CM

## 2017-05-27 DIAGNOSIS — Z7901 Long term (current) use of anticoagulants: Secondary | ICD-10-CM

## 2017-05-27 DIAGNOSIS — I480 Paroxysmal atrial fibrillation: Secondary | ICD-10-CM

## 2017-05-27 DIAGNOSIS — D509 Iron deficiency anemia, unspecified: Secondary | ICD-10-CM | POA: Diagnosis not present

## 2017-05-27 LAB — FERRITIN: Ferritin: 353 ng/ml — ABNORMAL HIGH (ref 9–269)

## 2017-05-27 LAB — CBC WITH DIFFERENTIAL/PLATELET
BASO%: 1.4 % (ref 0.0–2.0)
BASOS ABS: 0.1 10*3/uL (ref 0.0–0.1)
EOS ABS: 0.3 10*3/uL (ref 0.0–0.5)
EOS%: 4.1 % (ref 0.0–7.0)
HCT: 31.7 % — ABNORMAL LOW (ref 34.8–46.6)
HGB: 9.9 g/dL — ABNORMAL LOW (ref 11.6–15.9)
LYMPH%: 39.4 % (ref 14.0–49.7)
MCH: 20.8 pg — AB (ref 25.1–34.0)
MCHC: 31.3 g/dL — AB (ref 31.5–36.0)
MCV: 66.4 fL — AB (ref 79.5–101.0)
MONO#: 0.6 10*3/uL (ref 0.1–0.9)
MONO%: 8.5 % (ref 0.0–14.0)
NEUT#: 3.2 10*3/uL (ref 1.5–6.5)
NEUT%: 46.6 % (ref 38.4–76.8)
Platelets: 310 10*3/uL (ref 145–400)
RBC: 4.77 10*6/uL (ref 3.70–5.45)
RDW: 19.9 % — ABNORMAL HIGH (ref 11.2–14.5)
WBC: 6.8 10*3/uL (ref 3.9–10.3)
lymph#: 2.7 10*3/uL (ref 0.9–3.3)

## 2017-05-27 LAB — IRON AND TIBC
%SAT: 18 % — AB (ref 21–57)
Iron: 50 ug/dL (ref 41–142)
TIBC: 278 ug/dL (ref 236–444)
UIBC: 227 ug/dL (ref 120–384)

## 2017-05-27 NOTE — Patient Instructions (Signed)
Begin ferrous sulfate 325 mg daily. You may use stool softeners over the counter if needed for constipation. We will follow up on your iron studies from today and if you need IV iron we will call you.

## 2017-05-27 NOTE — Telephone Encounter (Signed)
Gave avs and calendar for December

## 2017-05-27 NOTE — Telephone Encounter (Signed)
Patient states that she is returning a call to our office.

## 2017-05-27 NOTE — Telephone Encounter (Signed)
Returned the call to the patient. She was informed that there was not any note where someone had called.

## 2017-05-29 ENCOUNTER — Encounter: Payer: Self-pay | Admitting: Oncology

## 2017-05-29 NOTE — Progress Notes (Signed)
[facility] OFFICE PROGRESS NOTE  [name], [address] [address]  DIAGNOSIS: Unspecified anemia questionable for anemia of chronic disease/iron deficiency.   PRIOR THERAPY: Status post Feraheme infusion 500 mg IV weekly 2 doses , last dose was given 07/24/2016.  CURRENT THERAPY: Over-the-counter oral iron tablets. 1 tablets daily 2-3 times a week.  INTERVAL HISTORY: Michele [name] 66 y.o. female returns for a routine follow up visit by herself. She was recently hospitalized for AKI and encephalopathy. She was discharged to a SNF and is now home. She has no specific complaints today. She denied having any chest pain, shortness of breath, cough or hemoptysis. Denies fevers and chills. Denies nausea, vomiting, constipation, or diarrhea. She is supposed to be taking oral iron tablets 1 tablet daily, but has stopped them. She had repeat CBC and iron study performed today and she is here for evaluation and discussion of her lab results.  MEDICAL HISTORY: Past Medical History:  Diagnosis Date  . Allergic rhinitis   . ARF (acute renal failure) ([place]) 03/2017  . 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 Asthma   . CAD (coronary artery disease)   . CAD (coronary artery disease) 01/30/2007   stents trivial coronary artery disease diffusely, with a recent deployment od a intracoronary artery stent, 3.5 x 12 mm driver stent with no more than 20-30% in- stents restenosis.done by Dr [name] with re-look on novenber 10 2008 revealing a widely patent stent with otherwise trival CAD and normal LV function  . Carpal tunnel syndrome, right   . Chest pain 07/17/12009   2 D Echo EF >55%  . Depression   . Diabetes mellitus type II   . Gait instability   . Hyperlipidemia   . Hypertension   . Iron deficiency anemia 07/13/2016  . Obesity   . Paroxysmal A-fib ([place])   . PTSD (post-traumatic stress disorder)   . Sleep apnea     ALLERGIES:  is  allergic to meprobamate.  MEDICATIONS:  Current Outpatient Medications  Medication Sig Dispense Refill  . ADVAIR DISKUS 250-50 MCG/DOSE AEPB INHALE ONE PUFF TWICE DAILY 60 each 5  . albuterol (PROVENTIL HFA;VENTOLIN HFA) 108 (90 Base) MCG/ACT inhaler Inhale 1 puff into the lungs as directed.    Marland Kitchen albuterol (VENTOLIN HFA) 108 (90 Base) MCG/ACT inhaler Inhale 1 puff into the lungs every 6 (six) hours as needed for wheezing or shortness of breath. 18 g 4  . ARIPiprazole (ABILIFY) 5 MG tablet Take 1 tablet (5 mg total) daily by mouth. 90 tablet 0  . aspirin EC 81 MG tablet Take 81 mg by mouth daily.    . benztropine (COGENTIN) 0.5 MG tablet Take 1 tablet (0.5 mg total) at bedtime by mouth. 90 tablet 0  . feeding supplement (BOOST / RESOURCE BREEZE) LIQD Take 1 Container by mouth 3 (three) times daily between meals.  0  . gabapentin (NEURONTIN) 300 MG capsule Take 300 mg by mouth at bedtime.  0  . hydrochlorothiazide (MICROZIDE) 12.5 MG capsule Take 1 capsule (12.5 mg total) by mouth daily.    Marland Kitchen HYDROcodone-acetaminophen (NORCO) 10-325 MG tablet Take 1 tablet by mouth 2 (two) times daily as needed (pain). 10 tablet 0  . irbesartan-hydrochlorothiazide (AVALIDE) 150-12.5 MG tablet Take 1 tablet by mouth daily. 30 tablet 11  . isosorbide mononitrate (IMDUR) 30 MG 24 hr tablet TAKE ONE TABLET BY MOUTH EVERY DAY. 30 tablet 11  . metFORMIN (GLUCOPHAGE-XR)  500 MG 24 hr tablet TAKE ONE (1) TABLET BY MOUTH EVERY DAY WITH BREAKFAST (Patient taking differently: TAKE ONE (1) TABLET (500 MG) BY MOUTH EVERY DAY WITH BREAKFAST) 90 tablet 3  . metoprolol tartrate (LOPRESSOR) 25 MG tablet Take 1 tablet (25 mg total) by mouth 2 (two) times daily.    . mometasone (NASONEX) 50 MCG/ACT nasal spray Place 2 sprays into the nose daily. 17 g 2  . nitroGLYCERIN (NITROSTAT) 0.4 MG SL tablet Place 1 tablet (0.4 mg total) under the tongue every 5 (five) minutes as needed. (Patient taking differently: Place 0.4 mg under the tongue  every 5 (five) minutes as needed for chest pain. ) 25 tablet 3  . OXYGEN Inhale 4 L into the lungs continuous.    Marland Kitchen PRESCRIPTION MEDICATION Inhale into the lungs at bedtime. CPAP    . rosuvastatin (CRESTOR) 10 MG tablet TAKE ONE (1) TABLET BY MOUTH EVERY DAY (Patient taking differently: TAKE ONE (1) TABLET (10 MG)  BY MOUTH EVERY DAY) 90 tablet 1  . sertraline (ZOLOFT) 100 MG tablet TAKE TWO (2) TABLETS BY MOUTH DAILY 180 tablet 0  . Trospium Chloride 60 MG CP24 TAKE ONE CAPSULE BY MOUTH DAILY 90 each 1  . warfarin (COUMADIN) 7.5 MG tablet TAKE ONE AND ONE-HALF (1 & 1/2) TO TWO (2) TABLETS BY MOUTH DAILY AS DIRECTED (Patient taking differently: TAKE ONE TABLET (7.5 MG) BY MOUTH ON MONDAYS & FRIDAYS, TAKE ONE AND ONE-HALF TABLET (11.25 MG) ON ALL OTHER DAYS OF THE WEEK) 165 tablet 0   No current facility-administered medications for this visit.     SURGICAL HISTORY:  Past Surgical History:  Procedure Laterality Date  . CHOLECYSTECTOMY    . GALLBLADDER SURGERY    . HEMORRHOID SURGERY    . Kidney stones    . Left groin cyst    . multilevel lumbar degenerative disc disease    . stress myocardial perfusion study  09/08/2010   normal; EF 75%    REVIEW OF SYSTEMS:   Review of Systems  Constitutional: Negative for appetite change, chills, fatigue, fever and unexpected weight change.  HENT:   Negative for mouth sores, nosebleeds, sore throat and trouble swallowing.   Eyes: Negative for eye problems and icterus.  Respiratory: Negative for cough, hemoptysis, shortness of breath and wheezing.   Cardiovascular: Negative for chest pain and leg swelling.  Gastrointestinal: Negative for abdominal pain, constipation, diarrhea, nausea and vomiting.  Genitourinary: Negative for bladder incontinence, difficulty urinating, dysuria, frequency and hematuria.   Musculoskeletal: Negative for back pain, gait problem, neck pain and neck stiffness.  Skin: Negative for itching and rash.  Neurological: Negative  for dizziness, extremity weakness, gait problem, headaches, light-headedness and seizures.  Hematological: Negative for adenopathy. Does not bruise/bleed easily.  Psychiatric/Behavioral: Negative for confusion, depression and sleep disturbance. The patient is not nervous/anxious.     PHYSICAL EXAMINATION:  Blood pressure 108/66, pulse 61, temperature 98.6 F (37 C), temperature source Oral, resp. rate 18, height 5\' 2"  (1.575 m), weight 286 lb 6.4 oz (129.9 kg), SpO2 99 %.  Physical Exam  Constitutional: Oriented to person, place, and time and well-developed, well-nourished, and in no distress. No distress.  HENT:  Head: Normocephalic and atraumatic.  Mouth/Throat: Oropharynx is clear and moist. No oropharyngeal exudate.  Eyes: Conjunctivae are normal. Right eye exhibits no discharge. Left eye exhibits no discharge. No scleral icterus.  Neck: Normal range of motion. Neck supple.  Cardiovascular: Normal rate, regular rhythm, normal heart sounds and intact distal  pulses.   Pulmonary/Chest: Effort normal and breath sounds normal. No respiratory distress. No wheezes. No rales.  Abdominal: Soft. Bowel sounds are normal. Exhibits no distension and no mass. There is no tenderness.  Musculoskeletal: Normal range of motion. Exhibits no edema.  Lymphadenopathy:    No cervical adenopathy.  Neurological: Alert and oriented to person, place, and time. Exhibits normal muscle tone. Gait normal. Coordination normal.  Skin: Skin is warm and dry. No rash noted. Not diaphoretic. No erythema. No pallor.  Psychiatric: Mood, memory and judgment normal.  Vitals reviewed.  LABORATORY DATA: Lab Results  Component Value Date   WBC 6.8 05/27/2017   HGB 9.9 (L) 05/27/2017   HCT 31.7 (L) 05/27/2017   MCV 66.4 (L) 05/27/2017   PLT 310 05/27/2017      Chemistry      Component Value Date/Time   NA 139 04/21/2017 0441   NA 144 10/02/2012 0905   K 3.5 04/21/2017 0441   K 3.4 (L) 10/02/2012 0905   CL 99 (L)  04/21/2017 0441   CL 101 10/02/2012 0905   CO2 32 04/21/2017 0441   CO2 31 (H) 10/02/2012 0905   BUN 17 04/21/2017 0441   BUN 13.9 10/02/2012 0905   CREATININE 0.81 04/21/2017 0441   CREATININE 0.77 12/08/2014 1153   CREATININE 0.8 10/02/2012 0905      Component Value Date/Time   CALCIUM 8.8 (L) 04/21/2017 0441   CALCIUM 9.9 10/02/2012 0905   ALKPHOS 97 04/12/2017 0912   AST 46 (H) 04/12/2017 0912   ALT 25 04/12/2017 0912   BILITOT 0.5 04/12/2017 0912       RADIOGRAPHIC STUDIES:  No results found.   ASSESSMENT/PLAN:  Iron deficiency anemia This is a very pleasant 66 year old African-American female with iron deficiency anemia plus/minus anemia of chronic disease status post Feraheme infusion and she is currently supposed to be on oral iron tablets once daily. The patient is feeling much better. Her hemoglobin and hematocrit are lower compared to 6 months ago, but she has not been taking her oral iron and had a recent hospitalization for AKI. Iron studies are pending.  I discussed the CBC with the patient today. I recommended for her to restart on the oral iron tablets once daily for now.  I will see her back for follow-up visit in 4-6 weeks for evaluation with repeat CBC, iron study and ferritin to be sure her labs are improving.  She was advised to call immediately if she has any concerning symptoms in the interval. The patient voices understanding of current disease status and treatment options and is in agreement with the current care plan. All questions were answered. The patient knows to call the clinic with any problems, questions or concerns. We can certainly see the patient much sooner if necessary.  Orders Placed This Encounter  Procedures  . CBC with Differential/Platelet    Standing Status:   Future    Standing Expiration Date:   05/27/2018  . Ferritin    Standing Status:   Future    Standing Expiration Date:   05/27/2018  . Iron and TIBC    Standing Status:    Future    Standing Expiration Date:   05/27/2018   Mikey Bussing, DNP, AGPCNP-BC, AOCNP 05/29/17

## 2017-05-29 NOTE — Assessment & Plan Note (Signed)
This is a very pleasant 66 year old African-American female with iron deficiency anemia plus/minus anemia of chronic disease status post Feraheme infusion and she is currently supposed to be on oral iron tablets once daily. The patient is feeling much better. Her hemoglobin and hematocrit are lower compared to 6 months ago, but she has not been taking her oral iron and had a recent hospitalization for AKI. Iron studies are pending.  I discussed the CBC with the patient today. I recommended for her to restart on the oral iron tablets once daily for now.  I will see her back for follow-up visit in 4-6 weeks for evaluation with repeat CBC, iron study and ferritin to be sure her labs are improving.  She was advised to call immediately if she has any concerning symptoms in the interval. The patient voices understanding of current disease status and treatment options and is in agreement with the current care plan. All questions were answered. The patient knows to call the clinic with any problems, questions or concerns. We can certainly see the patient much sooner if necessary.

## 2017-05-30 ENCOUNTER — Ambulatory Visit (INDEPENDENT_AMBULATORY_CARE_PROVIDER_SITE_OTHER): Payer: Medicare Other | Admitting: Pharmacist Clinician (PhC)/ Clinical Pharmacy Specialist

## 2017-05-30 DIAGNOSIS — Z7901 Long term (current) use of anticoagulants: Secondary | ICD-10-CM | POA: Diagnosis not present

## 2017-05-30 DIAGNOSIS — I480 Paroxysmal atrial fibrillation: Secondary | ICD-10-CM | POA: Diagnosis not present

## 2017-05-30 DIAGNOSIS — R791 Abnormal coagulation profile: Secondary | ICD-10-CM | POA: Diagnosis not present

## 2017-05-30 LAB — PROTIME-INR: INR: 0.9 (ref 0.9–1.1)

## 2017-06-02 ENCOUNTER — Ambulatory Visit (INDEPENDENT_AMBULATORY_CARE_PROVIDER_SITE_OTHER): Payer: Medicare Other | Admitting: Pharmacist Clinician (PhC)/ Clinical Pharmacy Specialist

## 2017-06-02 DIAGNOSIS — I480 Paroxysmal atrial fibrillation: Secondary | ICD-10-CM

## 2017-06-02 DIAGNOSIS — Z7901 Long term (current) use of anticoagulants: Secondary | ICD-10-CM

## 2017-06-02 LAB — PROTIME-INR: INR: 1.1 (ref <=1.1)

## 2017-06-06 ENCOUNTER — Ambulatory Visit (INDEPENDENT_AMBULATORY_CARE_PROVIDER_SITE_OTHER): Payer: Medicare Other | Admitting: Pharmacist

## 2017-06-06 ENCOUNTER — Other Ambulatory Visit: Payer: Self-pay | Admitting: Family Medicine

## 2017-06-06 DIAGNOSIS — I480 Paroxysmal atrial fibrillation: Secondary | ICD-10-CM | POA: Diagnosis not present

## 2017-06-06 DIAGNOSIS — Z7901 Long term (current) use of anticoagulants: Secondary | ICD-10-CM

## 2017-06-06 LAB — POCT INR: INR: 2

## 2017-06-06 NOTE — Telephone Encounter (Signed)
No visit to PCP in over 1 year; requesting refills for HCTZ and Aspirin 81 mg; does pt need to be seen before meds can be refilled?

## 2017-06-13 ENCOUNTER — Ambulatory Visit (HOSPITAL_COMMUNITY): Payer: Self-pay | Admitting: Licensed Clinical Social Worker

## 2017-06-14 ENCOUNTER — Ambulatory Visit (INDEPENDENT_AMBULATORY_CARE_PROVIDER_SITE_OTHER): Payer: Medicare Other | Admitting: Pharmacist Clinician (PhC)/ Clinical Pharmacy Specialist

## 2017-06-14 DIAGNOSIS — Z7901 Long term (current) use of anticoagulants: Secondary | ICD-10-CM | POA: Diagnosis not present

## 2017-06-14 DIAGNOSIS — I480 Paroxysmal atrial fibrillation: Secondary | ICD-10-CM

## 2017-06-14 LAB — POCT INR: INR: 3

## 2017-06-20 ENCOUNTER — Telehealth: Payer: Self-pay | Admitting: Cardiovascular Disease

## 2017-06-20 NOTE — Telephone Encounter (Signed)
Patient calling, states that she received a missed call from our office.

## 2017-06-21 ENCOUNTER — Ambulatory Visit (INDEPENDENT_AMBULATORY_CARE_PROVIDER_SITE_OTHER): Payer: Medicare Other | Admitting: Pharmacist Clinician (PhC)/ Clinical Pharmacy Specialist

## 2017-06-21 DIAGNOSIS — I480 Paroxysmal atrial fibrillation: Secondary | ICD-10-CM

## 2017-06-21 DIAGNOSIS — Z7901 Long term (current) use of anticoagulants: Secondary | ICD-10-CM | POA: Diagnosis not present

## 2017-06-21 LAB — POCT INR: INR: 1.7

## 2017-06-21 NOTE — Telephone Encounter (Signed)
Cannot find that anyone called her.

## 2017-06-27 ENCOUNTER — Ambulatory Visit: Payer: Medicare Other | Admitting: Internal Medicine

## 2017-06-27 ENCOUNTER — Other Ambulatory Visit: Payer: Medicare Other

## 2017-06-29 ENCOUNTER — Ambulatory Visit (INDEPENDENT_AMBULATORY_CARE_PROVIDER_SITE_OTHER): Payer: Medicare Other | Admitting: Pharmacist Clinician (PhC)/ Clinical Pharmacy Specialist

## 2017-06-29 DIAGNOSIS — I480 Paroxysmal atrial fibrillation: Secondary | ICD-10-CM | POA: Diagnosis not present

## 2017-06-29 DIAGNOSIS — Z7901 Long term (current) use of anticoagulants: Secondary | ICD-10-CM | POA: Diagnosis not present

## 2017-06-29 LAB — POCT INR: INR: 2.3

## 2017-07-01 ENCOUNTER — Other Ambulatory Visit: Payer: Self-pay | Admitting: Cardiovascular Disease

## 2017-07-01 NOTE — Telephone Encounter (Signed)
Please review for refill, thanks ! 

## 2017-07-04 ENCOUNTER — Ambulatory Visit (INDEPENDENT_AMBULATORY_CARE_PROVIDER_SITE_OTHER): Payer: Medicare Other | Admitting: Licensed Clinical Social Worker

## 2017-07-04 ENCOUNTER — Ambulatory Visit (INDEPENDENT_AMBULATORY_CARE_PROVIDER_SITE_OTHER): Payer: Medicare Other | Admitting: Pharmacist

## 2017-07-04 ENCOUNTER — Encounter (HOSPITAL_COMMUNITY): Payer: Self-pay | Admitting: Licensed Clinical Social Worker

## 2017-07-04 DIAGNOSIS — Z7901 Long term (current) use of anticoagulants: Secondary | ICD-10-CM | POA: Diagnosis not present

## 2017-07-04 DIAGNOSIS — F331 Major depressive disorder, recurrent, moderate: Secondary | ICD-10-CM

## 2017-07-04 DIAGNOSIS — I480 Paroxysmal atrial fibrillation: Secondary | ICD-10-CM

## 2017-07-04 LAB — POCT INR: INR: 3.2

## 2017-07-04 NOTE — Progress Notes (Signed)
   THERAPIST PROGRESS NOTE  Session Time: 11:00am-12:00pm  Participation Level: Active  Behavioral Response: NeatAlertDepressed  Type of Therapy: Individual Therapy  Treatment Goals addressed: Diagnosis: Major Depressive Disorder, recurrent episode, moderate  Interventions: CBT and Motivational Interviewing  Summary: Michele [name] is a 66 y.o. female who presents with Major Depressive Disorder, recurrent episode, moderate.   Suicidal/Homicidal: Nowithout intent/plan  Therapist Response: [name] engaged well in [name]. [name] is transitioning from [name], [name] for ongoing treatment on MDD. She reports she is often effected by weather in her depression. She also reports increased pain in her body, which increases depression. [name] reports a long history of trauma and losses. However, she denies PTSD at this time. This will continue to be assessed as time goes on.   Plan: Return again in 2-3 weeks.  Diagnosis: Axis I:Major Depressive Disorder, recurrent episode, moderate  Mindi Curling 07/04/2017
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
## 2017-07-08 ENCOUNTER — Telehealth: Payer: Self-pay | Admitting: Cardiovascular Disease

## 2017-07-08 NOTE — Telephone Encounter (Signed)
Please call,she needs to go over pt's medication list.

## 2017-07-08 NOTE — Telephone Encounter (Signed)
Returned call to [place] with [facility].She was calling to review patient's medications to update their list.Medications reviewed.

## 2017-07-13 ENCOUNTER — Telehealth: Payer: Self-pay | Admitting: Pharmacist Clinician (PhC)/ Clinical Pharmacy Specialist

## 2017-07-13 ENCOUNTER — Ambulatory Visit (INDEPENDENT_AMBULATORY_CARE_PROVIDER_SITE_OTHER): Payer: Medicare Other | Admitting: Pharmacist Clinician (PhC)/ Clinical Pharmacy Specialist

## 2017-07-13 DIAGNOSIS — R791 Abnormal coagulation profile: Secondary | ICD-10-CM | POA: Diagnosis not present

## 2017-07-13 DIAGNOSIS — I480 Paroxysmal atrial fibrillation: Secondary | ICD-10-CM

## 2017-07-13 DIAGNOSIS — Z7901 Long term (current) use of anticoagulants: Secondary | ICD-10-CM | POA: Diagnosis not present

## 2017-07-13 LAB — POCT INR: INR: 4.9

## 2017-07-13 NOTE — Telephone Encounter (Signed)
Received fax, INR noted, spoke with patient daughter about dose

## 2017-07-13 NOTE — Telephone Encounter (Signed)
Darlina Guys Micheline Maze) calling from [facility] to see if you received documents regarding INR that she faxed today for this patient.

## 2017-07-15 DIAGNOSIS — E1140 Type 2 diabetes mellitus with diabetic neuropathy, unspecified: Secondary | ICD-10-CM | POA: Diagnosis not present

## 2017-07-15 DIAGNOSIS — Z7982 Long term (current) use of aspirin: Secondary | ICD-10-CM | POA: Diagnosis not present

## 2017-07-15 DIAGNOSIS — I2510 Atherosclerotic heart disease of native coronary artery without angina pectoris: Secondary | ICD-10-CM | POA: Diagnosis not present

## 2017-07-15 DIAGNOSIS — I110 Hypertensive heart disease with heart failure: Secondary | ICD-10-CM | POA: Diagnosis not present

## 2017-07-20 ENCOUNTER — Ambulatory Visit (INDEPENDENT_AMBULATORY_CARE_PROVIDER_SITE_OTHER): Payer: Medicare Other | Admitting: Pharmacist Clinician (PhC)/ Clinical Pharmacy Specialist

## 2017-07-20 DIAGNOSIS — Z7901 Long term (current) use of anticoagulants: Secondary | ICD-10-CM | POA: Diagnosis not present

## 2017-07-20 DIAGNOSIS — I480 Paroxysmal atrial fibrillation: Secondary | ICD-10-CM | POA: Diagnosis not present

## 2017-07-20 LAB — POCT INR: INR: 1.3

## 2017-07-27 ENCOUNTER — Inpatient Hospital Stay: Payer: Medicare Other | Admitting: Internal Medicine

## 2017-07-27 ENCOUNTER — Telehealth: Payer: Self-pay | Admitting: Internal Medicine

## 2017-07-27 ENCOUNTER — Encounter: Payer: Self-pay | Admitting: Internal Medicine

## 2017-07-27 ENCOUNTER — Ambulatory Visit (INDEPENDENT_AMBULATORY_CARE_PROVIDER_SITE_OTHER): Payer: Medicare Other | Admitting: Pharmacist Clinician (PhC)/ Clinical Pharmacy Specialist

## 2017-07-27 ENCOUNTER — Inpatient Hospital Stay: Payer: Medicare Other | Attending: Internal Medicine

## 2017-07-27 VITALS — BP 115/74 | HR 78 | Temp 97.7°F | Resp 16 | Ht 62.0 in | Wt 288.9 lb

## 2017-07-27 DIAGNOSIS — I10 Essential (primary) hypertension: Secondary | ICD-10-CM | POA: Insufficient documentation

## 2017-07-27 DIAGNOSIS — Z7901 Long term (current) use of anticoagulants: Secondary | ICD-10-CM

## 2017-07-27 DIAGNOSIS — E785 Hyperlipidemia, unspecified: Secondary | ICD-10-CM | POA: Diagnosis not present

## 2017-07-27 DIAGNOSIS — D509 Iron deficiency anemia, unspecified: Secondary | ICD-10-CM | POA: Diagnosis not present

## 2017-07-27 DIAGNOSIS — I480 Paroxysmal atrial fibrillation: Secondary | ICD-10-CM

## 2017-07-27 DIAGNOSIS — E119 Type 2 diabetes mellitus without complications: Secondary | ICD-10-CM | POA: Insufficient documentation

## 2017-07-27 DIAGNOSIS — R062 Wheezing: Secondary | ICD-10-CM | POA: Diagnosis not present

## 2017-07-27 DIAGNOSIS — J40 Bronchitis, not specified as acute or chronic: Secondary | ICD-10-CM | POA: Insufficient documentation

## 2017-07-27 DIAGNOSIS — R7309 Other abnormal glucose: Secondary | ICD-10-CM | POA: Diagnosis not present

## 2017-07-27 DIAGNOSIS — J449 Chronic obstructive pulmonary disease, unspecified: Secondary | ICD-10-CM | POA: Diagnosis not present

## 2017-07-27 DIAGNOSIS — D508 Other iron deficiency anemias: Secondary | ICD-10-CM | POA: Diagnosis not present

## 2017-07-27 DIAGNOSIS — Z79899 Other long term (current) drug therapy: Secondary | ICD-10-CM | POA: Diagnosis not present

## 2017-07-27 LAB — CBC WITH DIFFERENTIAL/PLATELET
Abs Granulocyte: 5.1 10*3/uL (ref 1.5–6.5)
Basophils Absolute: 0 10*3/uL (ref 0.0–0.1)
Basophils Relative: 1 %
EOS ABS: 0.6 10*3/uL — AB (ref 0.0–0.5)
EOS PCT: 8 %
HCT: 29.5 % — ABNORMAL LOW (ref 34.8–46.6)
Hemoglobin: 8.9 g/dL — ABNORMAL LOW (ref 11.6–15.9)
LYMPHS PCT: 21 %
Lymphs Abs: 1.7 10*3/uL (ref 0.9–3.3)
MCH: 21.4 pg — ABNORMAL LOW (ref 25.1–34.0)
MCHC: 30.2 g/dL — ABNORMAL LOW (ref 31.5–36.0)
MCV: 71.1 fL — AB (ref 79.5–101.0)
MONO ABS: 0.6 10*3/uL (ref 0.1–0.9)
Monocytes Relative: 8 %
NEUTROS PCT: 62 %
Neutro Abs: 5.1 10*3/uL (ref 1.5–6.5)
PLATELETS: 275 10*3/uL (ref 145–400)
RBC: 4.15 MIL/uL (ref 3.70–5.45)
RDW: 19.6 % — AB (ref 11.2–16.1)
WBC: 8.1 10*3/uL (ref 3.9–10.3)

## 2017-07-27 LAB — IRON AND TIBC
Iron: 27 ug/dL — ABNORMAL LOW (ref 41–142)
Saturation Ratios: 9 % — ABNORMAL LOW (ref 21–57)
TIBC: 288 ug/dL (ref 236–444)
UIBC: 261 ug/dL

## 2017-07-27 LAB — POCT INR: INR: 2.3

## 2017-07-27 LAB — FERRITIN: FERRITIN: 277 ng/mL — AB (ref 9–269)

## 2017-07-27 MED ORDER — LEVOFLOXACIN 500 MG PO TABS: 500.0000 mg | ORAL_TABLET | Freq: Every day | ORAL | 0 refills | Status: DC

## 2017-07-27 MED ORDER — METHYLPREDNISOLONE 4 MG PO TBPK: ORAL_TABLET | ORAL | 0 refills | Status: DC

## 2017-07-27 MED ORDER — ALBUTEROL SULFATE (2.5 MG/3ML) 0.083% IN NEBU
2.5000 mg | INHALATION_SOLUTION | RESPIRATORY_TRACT | Status: DC
  Filled 2017-07-27: qty 3

## 2017-07-27 NOTE — Progress Notes (Signed)
[name]   [phone]  OFFICE PROGRESS NOTE  [name], [name] [place] [address]  DIAGNOSIS: Unspecified anemia questionable for anemia of chronic disease/iron deficiency.   PRIOR THERAPY: Status post Feraheme infusion 500 mg IV weekly 2 doses , last dose was given 07/24/2016.  CURRENT THERAPY: Over-the-counter oral iron tablets. 1 tablets daily 2-3 times a week.  INTERVAL HISTORY: Michele [name] 67 y.o. female returns to the clinic today for follow-up visit.  The patient is complaining of chest congestion, wheezes as well as shortness of breath.  She had flulike symptoms recently and now she has cough productive of greenish brownish sputum.  She denied having any recent fever or chills.  She denied having any chest pain or hemoptysis.  She continues to complain of increasing fatigue with exertion.  She denied having any recent weight loss or night sweats.  She has no nausea, vomiting, diarrhea or constipation.  She is not taking the oral iron tablets as prescribed but she is drinking boost with iron supplements.  She is here today for evaluation and repeat CBC, iron study and ferritin.  MEDICAL HISTORY: Past Medical History:  Diagnosis Date  . Allergic rhinitis   . ARF (acute renal failure) ([place]) 03/2017  . 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 Asthma   . CAD (coronary artery disease)   . CAD (coronary artery disease) 01/30/2007   stents trivial coronary artery disease diffusely, with a recent deployment od a intracoronary artery stent, 3.5 x 12 mm driver stent with no more than 20-30% in- stents restenosis.done by Dr [name] with re-look on novenber 10 2008 revealing a widely patent stent with otherwise trival CAD and normal LV function  . Carpal tunnel syndrome, right   . Chest pain 07/17/12009   2 D Echo EF >55%  . Depression   . Diabetes mellitus type II   . Gait instability     . Hyperlipidemia   . Hypertension   . Iron deficiency anemia 07/13/2016  . Obesity   . Paroxysmal A-fib ([place])   . PTSD (post-traumatic stress disorder)   . Sleep apnea     ALLERGIES:  is allergic to meprobamate.  MEDICATIONS:  Current Outpatient Medications  Medication Sig Dispense Refill  . ADVAIR DISKUS 250-50 MCG/DOSE AEPB INHALE ONE PUFF TWICE DAILY 60 each 5  . albuterol (PROVENTIL HFA;VENTOLIN HFA) 108 (90 Base) MCG/ACT inhaler Inhale 1 puff into the lungs as directed.    Marland Kitchen albuterol (VENTOLIN HFA) 108 (90 Base) MCG/ACT inhaler Inhale 1 puff into the lungs every 6 (six) hours as needed for wheezing or shortness of breath. 18 g 4  . ARIPiprazole (ABILIFY) 5 MG tablet Take 1 tablet (5 mg total) daily by mouth. 90 tablet 0  . benztropine (COGENTIN) 0.5 MG tablet Take 1 tablet (0.5 mg total) at bedtime by mouth. 90 tablet 0  . feeding supplement (BOOST / RESOURCE BREEZE) LIQD Take 1 Container by mouth 3 (three) times daily between meals.  0  . gabapentin (NEURONTIN) 300 MG capsule Take 300 mg by mouth at bedtime.  0  . hydrochlorothiazide (MICROZIDE) 12.5 MG capsule TAKE ONE CAPSULE BY MOUTH DAILY 30 capsule 3  . HYDROcodone-acetaminophen (NORCO) 10-325 MG tablet Take 1 tablet by mouth 2 (two) times daily as needed (pain). 10 tablet 0  . irbesartan-hydrochlorothiazide (AVALIDE) 150-12.5 MG tablet Take 1 tablet by mouth daily. 30 tablet 11  .  isosorbide mononitrate (IMDUR) 30 MG 24 hr tablet TAKE ONE TABLET BY MOUTH EVERY DAY. 30 tablet 11  . metFORMIN (GLUCOPHAGE-XR) 500 MG 24 hr tablet TAKE ONE (1) TABLET BY MOUTH EVERY DAY WITH BREAKFAST (Patient taking differently: TAKE ONE (1) TABLET (500 MG) BY MOUTH EVERY DAY WITH BREAKFAST) 90 tablet 3  . metoprolol tartrate (LOPRESSOR) 25 MG tablet Take 1 tablet (25 mg total) by mouth 2 (two) times daily.    . mometasone (NASONEX) 50 MCG/ACT nasal spray Place 2 sprays into the nose daily. 17 g 2  . nitroGLYCERIN (NITROSTAT) 0.4 MG SL tablet  PLACE 1 TABLET UNDER THE TONGUE EVERY 5 MINUTES AS NEEDED FOR CHEST PAIN.  MAY REPEAT 3 TIMES.  IF NO RELIEF CALL DOCTOR 25 tablet 11  . OXYGEN Inhale 4 L into the lungs continuous.    Marland Kitchen PRESCRIPTION MEDICATION Inhale into the lungs at bedtime. CPAP    . QC LO-DOSE ASPIRIN 81 MG EC tablet TAKE ONE (1) TABLET BY MOUTH EVERY DAY 30 tablet 3  . rosuvastatin (CRESTOR) 10 MG tablet TAKE ONE (1) TABLET BY MOUTH EVERY DAY (Patient taking differently: TAKE ONE (1) TABLET (10 MG)  BY MOUTH EVERY DAY) 90 tablet 1  . sertraline (ZOLOFT) 100 MG tablet TAKE TWO (2) TABLETS BY MOUTH DAILY 180 tablet 0  . Trospium Chloride 60 MG CP24 TAKE ONE CAPSULE BY MOUTH DAILY 90 each 1  . warfarin (COUMADIN) 7.5 MG tablet TAKE ONE AND ONE-HALF TO TWO TABLETS BY MOUTH DAILY AS DIRECTED 150 tablet 0   No current facility-administered medications for this visit.     SURGICAL HISTORY:  Past Surgical History:  Procedure Laterality Date  . CHOLECYSTECTOMY    . GALLBLADDER SURGERY    . HEMORRHOID SURGERY    . Kidney stones    . Left groin cyst    . multilevel lumbar degenerative disc disease    . stress myocardial perfusion study  09/08/2010   normal; EF 75%    REVIEW OF SYSTEMS:  Constitutional: positive for fatigue Eyes: negative Ears, nose, mouth, throat, and face: negative Respiratory: positive for cough, dyspnea on exertion, sputum and wheezing Cardiovascular: negative Gastrointestinal: negative Genitourinary:negative Integument/breast: negative Hematologic/lymphatic: negative Musculoskeletal:positive for muscle weakness Neurological: negative Behavioral/Psych: negative Endocrine: negative Allergic/Immunologic: negative   PHYSICAL EXAMINATION: General appearance: alert, cooperative, fatigued and no distress Head: Normocephalic, without obvious abnormality, atraumatic Neck: no adenopathy, no JVD, supple, symmetrical, trachea midline and thyroid not enlarged, symmetric, no tenderness/mass/nodules Lymph  nodes: Cervical, supraclavicular, and axillary nodes normal. Resp: wheezes bilaterally Back: symmetric, no curvature. ROM normal. No CVA tenderness. Cardio: regular rate and rhythm, S1, S2 normal, no murmur, click, rub or gallop GI: soft, non-tender; bowel sounds normal; no masses,  no organomegaly Extremities: extremities normal, atraumatic, no cyanosis or edema Neurologic: Alert and oriented X 3, normal strength and tone. Normal symmetric reflexes. Normal coordination and gait  ECOG PERFORMANCE STATUS: 1 - Symptomatic but completely ambulatory  Blood pressure 115/74, pulse 78, temperature 97.7 F (36.5 C), temperature source Oral, resp. rate 16, height 5\' 2"  (1.575 m), weight 288 lb 14.4 oz (131 kg), SpO2 98 %.  LABORATORY DATA: Lab Results  Component Value Date   WBC 8.1 07/27/2017   HGB 8.9 (L) 07/27/2017   HCT 29.5 (L) 07/27/2017   MCV 71.1 (L) 07/27/2017   PLT 275 07/27/2017      Chemistry      Component Value Date/Time   NA 139 04/21/2017 0441   NA 144 10/02/2012 0905  K 3.5 04/21/2017 0441   K 3.4 (L) 10/02/2012 0905   CL 99 (L) 04/21/2017 0441   CL 101 10/02/2012 0905   CO2 32 04/21/2017 0441   CO2 31 (H) 10/02/2012 0905   BUN 17 04/21/2017 0441   BUN 13.9 10/02/2012 0905   CREATININE 0.81 04/21/2017 0441   CREATININE 0.77 12/08/2014 1153   CREATININE 0.8 10/02/2012 0905      Component Value Date/Time   CALCIUM 8.8 (L) 04/21/2017 0441   CALCIUM 9.9 10/02/2012 0905   ALKPHOS 97 04/12/2017 0912   AST 46 (H) 04/12/2017 0912   ALT 25 04/12/2017 0912   BILITOT 0.5 04/12/2017 0912     Iron study: Serum iron 27, total iron binding capacity 288, iron saturation 9%. Serum ferritin 277.  RADIOGRAPHIC STUDIES: No results found.  ASSESSMENT AND PLAN:  This is a very pleasant 67 years old African-American female with iron deficiency anemia plus/minus anemia of chronic disease status post Feraheme infusion and she is currently on oral iron tablets once daily.  She  was not compliant with her oral iron tablets recently.  CBC today showed worsening of her anemia and the iron study showed iron deficiency.  Ferritin is a still elevated but this is most likely reactive in nature. I had a lengthy discussion with the patient today about her condition.  I recommended for her to continue with the oral iron tablets for now.  I would also arrange for the patient to have Feraheme infusion for 2 doses in the next few weeks. For the recent bronchitis and wheezing, I arranged for the patient to receive albuterol nebulizer today.  I would also start her on Levaquin 500 mg p.o. daily for 7 days in addition to Medrol Dosepak. I will arrange for the patient to come back for follow-up visit in 3 months for evaluation with repeat CBC, iron study and ferritin. She was advised to call immediately if she has any concerning symptoms in the interval. The patient voices understanding of current disease status and treatment options and is in agreement with the current care plan. All questions were answered. The patient knows to call the clinic with any problems, questions or concerns. We can certainly see the patient much sooner if necessary. I spent 15 minutes counseling the patient face to face. The total time spent in the appointment was 25 minutes.  Disclaimer: This note was dictated with voice recognition software. Similar sounding words can inadvertently be transcribed and may not be corrected upon review.

## 2017-07-27 NOTE — Telephone Encounter (Signed)
Gave avs and calendar for april

## 2017-07-29 ENCOUNTER — Ambulatory Visit (INDEPENDENT_AMBULATORY_CARE_PROVIDER_SITE_OTHER): Payer: Medicare Other | Admitting: Pharmacist

## 2017-07-29 ENCOUNTER — Telehealth: Payer: Self-pay | Admitting: Internal Medicine

## 2017-07-29 ENCOUNTER — Telehealth: Payer: Self-pay | Admitting: Cardiovascular Disease

## 2017-07-29 DIAGNOSIS — E785 Hyperlipidemia, unspecified: Secondary | ICD-10-CM | POA: Diagnosis not present

## 2017-07-29 DIAGNOSIS — I480 Paroxysmal atrial fibrillation: Secondary | ICD-10-CM

## 2017-07-29 DIAGNOSIS — I4891 Unspecified atrial fibrillation: Secondary | ICD-10-CM | POA: Diagnosis not present

## 2017-07-29 DIAGNOSIS — R791 Abnormal coagulation profile: Secondary | ICD-10-CM | POA: Diagnosis not present

## 2017-07-29 DIAGNOSIS — G4730 Sleep apnea, unspecified: Secondary | ICD-10-CM | POA: Diagnosis not present

## 2017-07-29 DIAGNOSIS — Z7901 Long term (current) use of anticoagulants: Secondary | ICD-10-CM

## 2017-07-29 DIAGNOSIS — I10 Essential (primary) hypertension: Secondary | ICD-10-CM | POA: Diagnosis not present

## 2017-07-29 LAB — POCT INR: INR: 7.6

## 2017-07-29 LAB — PROTIME-INR: INR: 6.6 — AB (ref 0.9–1.1)

## 2017-07-29 NOTE — Telephone Encounter (Signed)
Note not needed . Thanks

## 2017-07-29 NOTE — Telephone Encounter (Signed)
Called regarding 1/18 and 1/25 Michele [name] said ok

## 2017-08-02 ENCOUNTER — Ambulatory Visit (INDEPENDENT_AMBULATORY_CARE_PROVIDER_SITE_OTHER): Payer: Medicare Other | Admitting: Pharmacist

## 2017-08-02 ENCOUNTER — Telehealth: Payer: Self-pay | Admitting: Cardiovascular Disease

## 2017-08-02 DIAGNOSIS — Z7901 Long term (current) use of anticoagulants: Secondary | ICD-10-CM

## 2017-08-02 DIAGNOSIS — I480 Paroxysmal atrial fibrillation: Secondary | ICD-10-CM

## 2017-08-02 LAB — POCT INR: INR: 2.6

## 2017-08-02 NOTE — Telephone Encounter (Signed)
New Message   Izora Gala is calling from [facility] about a request for a medication transfer of Losartan. But they have no record of the patient being on this medication and would like to confirm. Please call.

## 2017-08-02 NOTE — Telephone Encounter (Signed)
SPOKE TO NANCY AT [facility] - PATIENT HAS BEEN ON IRBESARTAN -HCTZ , NO RECORD OF TAKING LOSARTAN BEING GIVEN TO PATIENT. PER NANCY SHE WILL LET  PHARMACY COMPANY- [facility] BE AWARE.

## 2017-08-04 DIAGNOSIS — G894 Chronic pain syndrome: Secondary | ICD-10-CM | POA: Insufficient documentation

## 2017-08-04 DIAGNOSIS — M545 Low back pain: Secondary | ICD-10-CM

## 2017-08-04 DIAGNOSIS — G8929 Other chronic pain: Secondary | ICD-10-CM | POA: Insufficient documentation

## 2017-08-04 DIAGNOSIS — Z79891 Long term (current) use of opiate analgesic: Secondary | ICD-10-CM | POA: Diagnosis not present

## 2017-08-05 ENCOUNTER — Inpatient Hospital Stay: Payer: Medicare Other

## 2017-08-05 VITALS — BP 104/56 | HR 64 | Temp 98.4°F | Resp 17

## 2017-08-05 DIAGNOSIS — D509 Iron deficiency anemia, unspecified: Secondary | ICD-10-CM | POA: Diagnosis not present

## 2017-08-05 DIAGNOSIS — D508 Other iron deficiency anemias: Secondary | ICD-10-CM

## 2017-08-05 MED ORDER — SODIUM CHLORIDE 0.9 % IV SOLN
510.0000 mg | Freq: Once | INTRAVENOUS | Status: AC
  Administered 2017-08-05: 510 mg via INTRAVENOUS
  Filled 2017-08-05: qty 17

## 2017-08-05 MED ORDER — SODIUM CHLORIDE 0.9 % IV SOLN
Freq: Once | INTRAVENOUS | Status: AC
  Administered 2017-08-05: 09:00:00 via INTRAVENOUS

## 2017-08-05 NOTE — Patient Instructions (Signed)

## 2017-08-09 ENCOUNTER — Ambulatory Visit (INDEPENDENT_AMBULATORY_CARE_PROVIDER_SITE_OTHER): Payer: Medicare Other | Admitting: Pharmacist Clinician (PhC)/ Clinical Pharmacy Specialist

## 2017-08-09 DIAGNOSIS — I480 Paroxysmal atrial fibrillation: Secondary | ICD-10-CM

## 2017-08-09 DIAGNOSIS — Z7901 Long term (current) use of anticoagulants: Secondary | ICD-10-CM | POA: Diagnosis not present

## 2017-08-09 LAB — POCT INR: INR: 5

## 2017-08-09 NOTE — Patient Instructions (Signed)
Description   Hold today, January 22nd. Take 0.5 tablet (3.75 mg) tomorrow, January 23rd. Take 1 tablet (7.5 mg) on Mondays and Fridays and 1.5 tablets (11.25 mg) all other days. Repeat INR in 2 weeks.

## 2017-08-12 ENCOUNTER — Inpatient Hospital Stay: Payer: Medicare Other

## 2017-08-12 VITALS — BP 105/56 | HR 78 | Temp 98.5°F | Resp 18

## 2017-08-12 DIAGNOSIS — D509 Iron deficiency anemia, unspecified: Secondary | ICD-10-CM | POA: Diagnosis not present

## 2017-08-12 DIAGNOSIS — D508 Other iron deficiency anemias: Secondary | ICD-10-CM

## 2017-08-12 MED ORDER — SODIUM CHLORIDE 0.9 % IV SOLN
510.0000 mg | Freq: Once | INTRAVENOUS | Status: AC
  Administered 2017-08-12: 510 mg via INTRAVENOUS
  Filled 2017-08-12: qty 17

## 2017-08-12 NOTE — Patient Instructions (Signed)

## 2017-08-16 ENCOUNTER — Ambulatory Visit (INDEPENDENT_AMBULATORY_CARE_PROVIDER_SITE_OTHER): Payer: Medicare Other | Admitting: Licensed Clinical Social Worker

## 2017-08-16 ENCOUNTER — Ambulatory Visit (HOSPITAL_COMMUNITY): Payer: Self-pay | Admitting: Licensed Clinical Social Worker

## 2017-08-16 ENCOUNTER — Encounter (HOSPITAL_COMMUNITY): Payer: Self-pay | Admitting: Licensed Clinical Social Worker

## 2017-08-16 DIAGNOSIS — F331 Major depressive disorder, recurrent, moderate: Secondary | ICD-10-CM

## 2017-08-16 NOTE — Progress Notes (Signed)
   THERAPIST PROGRESS NOTE  Session Time: 12:30pm-1:30pm  Participation Level: Active  Behavioral Response: Well GroomedConfusedDysphoric  Type of Therapy: Individual Therapy  Treatment Goals addressed: Improve Psychiatric Symptoms, elevate mood (increased self-esteem, increased self-compassion, increased interaction), improve unhelpful thought patterns, controlled behavior, moderate mood, deliberate speech and thought process(improved social functioning, healthy adjustment to living situation), Learn about diagnosis, healthy coping skills  Interventions: Motivational Interviewing, CBT, Grounding & Mindfulness Techniques, psychoeducation  Summary: Michele [name] is a 67 y.o. female who presents with Major Depressive Disorder, recurrent episode, moderate  Suicidal/Homicidal: No - without intent/plan  Therapist Response: Enid Derry met with clinician for an individual session.  [name] discussed her psychiatric symptoms, her current life events and her homework. [name] shared ongoing concerns about her health, memory, and aging process. [name] processed depressed mood and noted that she has been doing a bit better. [name] reports she relies heavily on her CNA, who cares for her on several days per week and assists with transportation. [name] identified some memories of past trauma, as well as interactions with her children that bother her. Clinician discussed radical acceptance and encouraged [name] to express gratitude and happiness for what she can do. Clinician and [name] worked together to create a Recruitment consultant. [name] identified concerns about her memory, depression, and acceptance of her aging process as goals.   Plan: Return again in 3-4 weeks  Diagnosis:     Axis I: Major Depressive Disorder, recurrent episode, moderate   Mindi Curling, LCSW 08/16/2017

## 2017-08-18 DIAGNOSIS — M5416 Radiculopathy, lumbar region: Secondary | ICD-10-CM | POA: Diagnosis not present

## 2017-08-24 ENCOUNTER — Ambulatory Visit (INDEPENDENT_AMBULATORY_CARE_PROVIDER_SITE_OTHER): Payer: Medicare Other | Admitting: Pharmacist

## 2017-08-24 DIAGNOSIS — I480 Paroxysmal atrial fibrillation: Secondary | ICD-10-CM | POA: Diagnosis not present

## 2017-08-24 DIAGNOSIS — Z7901 Long term (current) use of anticoagulants: Secondary | ICD-10-CM

## 2017-08-24 LAB — POCT INR: INR: 2.7

## 2017-08-24 NOTE — Patient Instructions (Signed)
Call coumadin clinic at [phone] if medication changes or start new supplement

## 2017-08-25 ENCOUNTER — Ambulatory Visit (INDEPENDENT_AMBULATORY_CARE_PROVIDER_SITE_OTHER): Payer: Medicare Other | Admitting: Psychiatry

## 2017-08-25 ENCOUNTER — Encounter (HOSPITAL_COMMUNITY): Payer: Self-pay | Admitting: Psychiatry

## 2017-08-25 DIAGNOSIS — F331 Major depressive disorder, recurrent, moderate: Secondary | ICD-10-CM | POA: Diagnosis not present

## 2017-08-25 DIAGNOSIS — M2550 Pain in unspecified joint: Secondary | ICD-10-CM | POA: Diagnosis not present

## 2017-08-25 DIAGNOSIS — Z818 Family history of other mental and behavioral disorders: Secondary | ICD-10-CM | POA: Diagnosis not present

## 2017-08-25 DIAGNOSIS — F4310 Post-traumatic stress disorder, unspecified: Secondary | ICD-10-CM | POA: Diagnosis not present

## 2017-08-25 DIAGNOSIS — M549 Dorsalgia, unspecified: Secondary | ICD-10-CM

## 2017-08-25 DIAGNOSIS — Z9149 Other personal history of psychological trauma, not elsewhere classified: Secondary | ICD-10-CM | POA: Diagnosis not present

## 2017-08-25 MED ORDER — ARIPIPRAZOLE 5 MG PO TABS: 5.0000 mg | ORAL_TABLET | Freq: Every day | ORAL | 0 refills | Status: DC

## 2017-08-25 MED ORDER — BENZTROPINE MESYLATE 0.5 MG PO TABS: 0.5000 mg | ORAL_TABLET | Freq: Every day | ORAL | 0 refills | Status: DC

## 2017-08-25 MED ORDER — SERTRALINE HCL 100 MG PO TABS: ORAL_TABLET | ORAL | 0 refills | Status: DC
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
## 2017-09-10 DIAGNOSIS — M5136 Other intervertebral disc degeneration, lumbar region: Secondary | ICD-10-CM | POA: Diagnosis not present

## 2017-09-10 DIAGNOSIS — M545 Low back pain: Secondary | ICD-10-CM | POA: Diagnosis not present

## 2017-09-13 ENCOUNTER — Telehealth: Payer: Self-pay | Admitting: Cardiovascular Disease

## 2017-09-13 NOTE — Telephone Encounter (Signed)
These were signed by Dr. Gwenlyn Found today and have been faxed.

## 2017-09-13 NOTE — Telephone Encounter (Signed)
New Message   Amy from [facility] is calling about some orders for Dr. Gwenlyn Found to sign off for on this patient. The orders was for PT and INR results. As well as physician communication and interim order. Please call to discuss.

## 2017-09-14 ENCOUNTER — Ambulatory Visit (INDEPENDENT_AMBULATORY_CARE_PROVIDER_SITE_OTHER): Payer: Medicare Other | Admitting: Pharmacist

## 2017-09-14 DIAGNOSIS — Z7901 Long term (current) use of anticoagulants: Secondary | ICD-10-CM | POA: Diagnosis not present

## 2017-09-14 DIAGNOSIS — I480 Paroxysmal atrial fibrillation: Secondary | ICD-10-CM | POA: Diagnosis not present

## 2017-09-14 LAB — POCT INR: INR: 3.4

## 2017-09-24 ENCOUNTER — Inpatient Hospital Stay (HOSPITAL_COMMUNITY)
Admission: EM | Admit: 2017-09-24 | Discharge: 2017-09-29 | DRG: 811 | Disposition: A | Discharge status: Home Health | Payer: Medicare Other | Attending: Internal Medicine | Admitting: Internal Medicine

## 2017-09-24 ENCOUNTER — Encounter (HOSPITAL_COMMUNITY): Payer: Self-pay | Admitting: Emergency Medicine

## 2017-09-24 ENCOUNTER — Other Ambulatory Visit: Payer: Self-pay

## 2017-09-24 ENCOUNTER — Emergency Department (HOSPITAL_COMMUNITY): Payer: Medicare Other

## 2017-09-24 DIAGNOSIS — Z452 Encounter for adjustment and management of vascular access device: Secondary | ICD-10-CM | POA: Diagnosis not present

## 2017-09-24 DIAGNOSIS — I248 Other forms of acute ischemic heart disease: Secondary | ICD-10-CM | POA: Diagnosis present

## 2017-09-24 DIAGNOSIS — F4310 Post-traumatic stress disorder, unspecified: Secondary | ICD-10-CM | POA: Diagnosis not present

## 2017-09-24 DIAGNOSIS — J4520 Mild intermittent asthma, uncomplicated: Secondary | ICD-10-CM | POA: Diagnosis not present

## 2017-09-24 DIAGNOSIS — K921 Melena: Secondary | ICD-10-CM | POA: Diagnosis not present

## 2017-09-24 DIAGNOSIS — E785 Hyperlipidemia, unspecified: Secondary | ICD-10-CM | POA: Diagnosis present

## 2017-09-24 DIAGNOSIS — I69318 Other symptoms and signs involving cognitive functions following cerebral infarction: Secondary | ICD-10-CM | POA: Diagnosis not present

## 2017-09-24 DIAGNOSIS — R1111 Vomiting without nausea: Secondary | ICD-10-CM | POA: Diagnosis not present

## 2017-09-24 DIAGNOSIS — D500 Iron deficiency anemia secondary to blood loss (chronic): Secondary | ICD-10-CM | POA: Diagnosis not present

## 2017-09-24 DIAGNOSIS — D649 Anemia, unspecified: Secondary | ICD-10-CM | POA: Diagnosis not present

## 2017-09-24 DIAGNOSIS — Z8249 Family history of ischemic heart disease and other diseases of the circulatory system: Secondary | ICD-10-CM | POA: Diagnosis not present

## 2017-09-24 DIAGNOSIS — Z9049 Acquired absence of other specified parts of digestive tract: Secondary | ICD-10-CM

## 2017-09-24 DIAGNOSIS — I2510 Atherosclerotic heart disease of native coronary artery without angina pectoris: Secondary | ICD-10-CM | POA: Diagnosis present

## 2017-09-24 DIAGNOSIS — A419 Sepsis, unspecified organism: Secondary | ICD-10-CM | POA: Diagnosis not present

## 2017-09-24 DIAGNOSIS — K922 Gastrointestinal hemorrhage, unspecified: Secondary | ICD-10-CM | POA: Diagnosis not present

## 2017-09-24 DIAGNOSIS — R112 Nausea with vomiting, unspecified: Secondary | ICD-10-CM | POA: Diagnosis present

## 2017-09-24 DIAGNOSIS — G9341 Metabolic encephalopathy: Secondary | ICD-10-CM | POA: Diagnosis present

## 2017-09-24 DIAGNOSIS — I480 Paroxysmal atrial fibrillation: Secondary | ICD-10-CM | POA: Diagnosis not present

## 2017-09-24 DIAGNOSIS — R079 Chest pain, unspecified: Secondary | ICD-10-CM | POA: Diagnosis not present

## 2017-09-24 DIAGNOSIS — R197 Diarrhea, unspecified: Secondary | ICD-10-CM

## 2017-09-24 DIAGNOSIS — K920 Hematemesis: Secondary | ICD-10-CM | POA: Diagnosis present

## 2017-09-24 DIAGNOSIS — I10 Essential (primary) hypertension: Secondary | ICD-10-CM | POA: Diagnosis not present

## 2017-09-24 DIAGNOSIS — Z7951 Long term (current) use of inhaled steroids: Secondary | ICD-10-CM

## 2017-09-24 DIAGNOSIS — D62 Acute posthemorrhagic anemia: Secondary | ICD-10-CM | POA: Diagnosis not present

## 2017-09-24 DIAGNOSIS — N179 Acute kidney failure, unspecified: Secondary | ICD-10-CM | POA: Diagnosis present

## 2017-09-24 DIAGNOSIS — D509 Iron deficiency anemia, unspecified: Secondary | ICD-10-CM | POA: Diagnosis present

## 2017-09-24 DIAGNOSIS — Z7982 Long term (current) use of aspirin: Secondary | ICD-10-CM | POA: Diagnosis not present

## 2017-09-24 DIAGNOSIS — Z955 Presence of coronary angioplasty implant and graft: Secondary | ICD-10-CM

## 2017-09-24 DIAGNOSIS — K2971 Gastritis, unspecified, with bleeding: Secondary | ICD-10-CM

## 2017-09-24 DIAGNOSIS — Z8673 Personal history of transient ischemic attack (TIA), and cerebral infarction without residual deficits: Secondary | ICD-10-CM | POA: Diagnosis present

## 2017-09-24 DIAGNOSIS — K2970 Gastritis, unspecified, without bleeding: Secondary | ICD-10-CM | POA: Diagnosis not present

## 2017-09-24 DIAGNOSIS — F331 Major depressive disorder, recurrent, moderate: Secondary | ICD-10-CM | POA: Diagnosis present

## 2017-09-24 DIAGNOSIS — K5710 Diverticulosis of small intestine without perforation or abscess without bleeding: Secondary | ICD-10-CM | POA: Diagnosis not present

## 2017-09-24 DIAGNOSIS — I959 Hypotension, unspecified: Secondary | ICD-10-CM | POA: Diagnosis present

## 2017-09-24 DIAGNOSIS — E1151 Type 2 diabetes mellitus with diabetic peripheral angiopathy without gangrene: Secondary | ICD-10-CM | POA: Diagnosis present

## 2017-09-24 DIAGNOSIS — J45909 Unspecified asthma, uncomplicated: Secondary | ICD-10-CM | POA: Diagnosis not present

## 2017-09-24 DIAGNOSIS — G4733 Obstructive sleep apnea (adult) (pediatric): Secondary | ICD-10-CM | POA: Diagnosis not present

## 2017-09-24 DIAGNOSIS — Z7984 Long term (current) use of oral hypoglycemic drugs: Secondary | ICD-10-CM

## 2017-09-24 DIAGNOSIS — R0602 Shortness of breath: Secondary | ICD-10-CM | POA: Diagnosis not present

## 2017-09-24 DIAGNOSIS — E860 Dehydration: Secondary | ICD-10-CM | POA: Diagnosis not present

## 2017-09-24 DIAGNOSIS — Z9981 Dependence on supplemental oxygen: Secondary | ICD-10-CM

## 2017-09-24 DIAGNOSIS — K429 Umbilical hernia without obstruction or gangrene: Secondary | ICD-10-CM | POA: Diagnosis present

## 2017-09-24 DIAGNOSIS — G9340 Encephalopathy, unspecified: Secondary | ICD-10-CM | POA: Diagnosis not present

## 2017-09-24 DIAGNOSIS — K219 Gastro-esophageal reflux disease without esophagitis: Secondary | ICD-10-CM | POA: Diagnosis present

## 2017-09-24 DIAGNOSIS — R4182 Altered mental status, unspecified: Secondary | ICD-10-CM | POA: Diagnosis not present

## 2017-09-24 DIAGNOSIS — Z6841 Body Mass Index (BMI) 40.0 and over, adult: Secondary | ICD-10-CM

## 2017-09-24 DIAGNOSIS — D508 Other iron deficiency anemias: Secondary | ICD-10-CM | POA: Diagnosis not present

## 2017-09-24 DIAGNOSIS — Z7901 Long term (current) use of anticoagulants: Secondary | ICD-10-CM

## 2017-09-24 HISTORY — DX: Other complications of anesthesia, initial encounter: T88.59XA

## 2017-09-24 HISTORY — DX: Adverse effect of unspecified anesthetic, initial encounter: T41.45XA

## 2017-09-24 LAB — CBC
HCT: 14.6 % — ABNORMAL LOW (ref 36.0–46.0)
HCT: 14.7 % — ABNORMAL LOW (ref 36.0–46.0)
Hemoglobin: 4.3 g/dL — CL (ref 12.0–15.0)
Hemoglobin: 4.4 g/dL — CL (ref 12.0–15.0)
MCH: 21.7 pg — ABNORMAL LOW (ref 26.0–34.0)
MCH: 22.4 pg — AB (ref 26.0–34.0)
MCHC: 29.5 g/dL — ABNORMAL LOW (ref 30.0–36.0)
MCHC: 29.9 g/dL — ABNORMAL LOW (ref 30.0–36.0)
MCV: 73.7 fL — AB (ref 78.0–100.0)
MCV: 75 fL — AB (ref 78.0–100.0)
Platelets: 352 10*3/uL (ref 150–400)
Platelets: 304 10*3/uL (ref 150–400)
RBC: 1.98 MIL/uL — AB (ref 3.87–5.11)
RBC: 1.96 MIL/uL — AB (ref 3.87–5.11)
RDW: 20.6 % — ABNORMAL HIGH (ref 11.5–15.5)
RDW: 21.1 % — AB (ref 11.5–15.5)
WBC: 23.9 10*3/uL — AB (ref 4.0–10.5)
WBC: 28.4 10*3/uL — AB (ref 4.0–10.5)

## 2017-09-24 LAB — COMPREHENSIVE METABOLIC PANEL
ALT: 17 U/L (ref 14–54)
AST: 28 U/L (ref 15–41)
Albumin: 3.4 g/dL — ABNORMAL LOW (ref 3.5–5.0)
Alkaline Phosphatase: 61 U/L (ref 38–126)
Anion gap: 18 — ABNORMAL HIGH (ref 5–15)
BUN: 41 mg/dL — AB (ref 6–20)
CO2: 18 mmol/L — AB (ref 22–32)
Calcium: 8.7 mg/dL — ABNORMAL LOW (ref 8.9–10.3)
Chloride: 95 mmol/L — ABNORMAL LOW (ref 101–111)
Creatinine, Ser: 1.14 mg/dL — ABNORMAL HIGH (ref 0.44–1.00)
GFR, EST AFRICAN AMERICAN: 57 mL/min — AB (ref >60)
GFR, EST NON AFRICAN AMERICAN: 49 mL/min — AB (ref >60)
Glucose, Bld: 269 mg/dL — ABNORMAL HIGH (ref 65–99)
POTASSIUM: 4.1 mmol/L (ref 3.5–5.1)
SODIUM: 131 mmol/L — AB (ref 135–145)
Total Bilirubin: 0.5 mg/dL (ref 0.3–1.2)
Total Protein: 6.2 g/dL — ABNORMAL LOW (ref 6.5–8.1)

## 2017-09-24 LAB — PROTIME-INR
INR: 2.92
PROTHROMBIN TIME: 30.3 s — AB (ref 11.4–15.2)

## 2017-09-24 LAB — LIPASE, BLOOD: LIPASE: 26 U/L (ref 11–51)

## 2017-09-24 LAB — I-STAT TROPONIN, ED: Troponin i, poc: 0.01 ng/mL (ref 0.00–0.08)

## 2017-09-24 LAB — PREPARE RBC (CROSSMATCH)

## 2017-09-24 LAB — BRAIN NATRIURETIC PEPTIDE: B Natriuretic Peptide: 16.7 pg/mL (ref 0.0–100.0)

## 2017-09-24 LAB — POC OCCULT BLOOD, ED: Fecal Occult Bld: POSITIVE — AB

## 2017-09-24 MED ORDER — INSULIN ASPART 100 UNIT/ML ~~LOC~~ SOLN
0.0000 [IU] | Freq: Three times a day (TID) | SUBCUTANEOUS | Status: DC
Start: 2017-09-25 — End: 2017-09-25

## 2017-09-24 MED ORDER — ACETAMINOPHEN 325 MG PO TABS
650.0000 mg | ORAL_TABLET | Freq: Four times a day (QID) | ORAL | Status: DC | PRN
  Administered 2017-09-26 – 2017-09-29 (×6): 650 mg via ORAL
  Filled 2017-09-24 (×8): qty 2

## 2017-09-24 MED ORDER — ONDANSETRON HCL 4 MG PO TABS: 4.0000 mg | ORAL_TABLET | Freq: Four times a day (QID) | ORAL | Status: DC | PRN

## 2017-09-24 MED ORDER — SODIUM CHLORIDE 0.9 % IV BOLUS (SEPSIS)
1500.0000 mL | Freq: Once | INTRAVENOUS | Status: AC
  Administered 2017-09-24: 1500 mL via INTRAVENOUS

## 2017-09-24 MED ORDER — SODIUM CHLORIDE 0.9 % IV SOLN: Freq: Once | INTRAVENOUS | Status: DC

## 2017-09-24 MED ORDER — MORPHINE SULFATE (PF) 4 MG/ML IV SOLN: 2.0000 mg | INTRAVENOUS | Status: DC | PRN

## 2017-09-24 MED ORDER — METRONIDAZOLE IN NACL 5-0.79 MG/ML-% IV SOLN
500.0000 mg | Freq: Three times a day (TID) | INTRAVENOUS | Status: DC
  Filled 2017-09-24: qty 100

## 2017-09-24 MED ORDER — SODIUM CHLORIDE 0.9 % IV SOLN: 10.0000 mL/h | Freq: Once | INTRAVENOUS | Status: DC

## 2017-09-24 MED ORDER — FUROSEMIDE 10 MG/ML IJ SOLN
40.0000 mg | Freq: Once | INTRAMUSCULAR | Status: AC
  Administered 2017-09-25: 40 mg via INTRAVENOUS
  Filled 2017-09-24: qty 4

## 2017-09-24 MED ORDER — ACETAMINOPHEN 650 MG RE SUPP: 650.0000 mg | Freq: Four times a day (QID) | RECTAL | Status: DC | PRN

## 2017-09-24 MED ORDER — SODIUM CHLORIDE 0.9 % IV BOLUS (SEPSIS): 500.0000 mL | Freq: Once | INTRAVENOUS | Status: DC

## 2017-09-24 MED ORDER — HYDRALAZINE HCL 20 MG/ML IJ SOLN
5.0000 mg | INTRAMUSCULAR | Status: DC | PRN
Start: 2017-09-24 — End: 2017-09-25

## 2017-09-24 MED ORDER — CIPROFLOXACIN IN D5W 400 MG/200ML IV SOLN
400.0000 mg | Freq: Two times a day (BID) | INTRAVENOUS | Status: DC
  Administered 2017-09-25: 400 mg via INTRAVENOUS
  Filled 2017-09-24 (×2): qty 200

## 2017-09-24 MED ORDER — METOPROLOL TARTRATE 5 MG/5ML IV SOLN: 5.0000 mg | INTRAVENOUS | Status: DC | PRN

## 2017-09-24 MED ORDER — PANTOPRAZOLE SODIUM 40 MG IV SOLR: 40.0000 mg | Freq: Two times a day (BID) | INTRAVENOUS | Status: DC

## 2017-09-24 MED ORDER — ONDANSETRON HCL 4 MG/2ML IJ SOLN
4.0000 mg | Freq: Four times a day (QID) | INTRAMUSCULAR | Status: DC | PRN
  Administered 2017-09-25 (×2): 4 mg via INTRAVENOUS
  Filled 2017-09-24 (×2): qty 2

## 2017-09-24 MED ORDER — SODIUM CHLORIDE 0.9 % IV SOLN
8.0000 mg/h | INTRAVENOUS | Status: DC
  Administered 2017-09-25: 8 mg/h via INTRAVENOUS
  Filled 2017-09-24 (×3): qty 80

## 2017-09-24 MED ORDER — SODIUM CHLORIDE 0.9 % IV SOLN
80.0000 mg | Freq: Once | INTRAVENOUS | Status: AC
  Administered 2017-09-25: 80 mg via INTRAVENOUS
  Filled 2017-09-24: qty 80

## 2017-09-24 MED ORDER — VITAMIN K1 10 MG/ML IJ SOLN
5.0000 mg | Freq: Once | INTRAVENOUS | Status: AC
  Administered 2017-09-24: 5 mg via INTRAVENOUS
  Filled 2017-09-24: qty 0.5

## 2017-09-24 MED ORDER — INSULIN ASPART 100 UNIT/ML ~~LOC~~ SOLN
0.0000 [IU] | Freq: Every day | SUBCUTANEOUS | Status: DC
Start: 2017-09-24 — End: 2017-09-29

## 2017-09-24 MED ORDER — SODIUM CHLORIDE 0.9 % IV SOLN
INTRAVENOUS | Status: DC
  Administered 2017-09-26 – 2017-09-29 (×4): via INTRAVENOUS

## 2017-09-24 NOTE — ED Notes (Signed)
No lab draw at this time,  Pt receiving blood. 

## 2017-09-24 NOTE — ED Notes (Signed)
PLASMA READY

## 2017-09-24 NOTE — ED Provider Notes (Signed)
[facility] EMERGENCY DEPARTMENT Provider Note   CSN: [id number] Arrival date & time: 09/24/17  1924     History   Chief Complaint Chief Complaint  Patient presents with  . Chest Pain  . Diarrhea    HPI Michele [name] is a 67 y.o. female.  HPI Level 5 caveat due to altered mental status.  Most history comes from patient's daughter. Patient presents with nausea vomiting diarrhea.  Has had chest pain shortness of breath.  Has some mild baseline confusion after a stroke but much more slow to answer more confused.  History of chronic anemia.  However patient looks more pale than her baseline.  She is on Coumadin. Past Medical History:  Diagnosis Date  . Allergic rhinitis   . ARF (acute renal failure) ([place]) 03/2017  . 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 Asthma   . CAD (coronary artery disease)   . CAD (coronary artery disease) 01/30/2007   stents trivial coronary artery disease diffusely, with a recent deployment od a intracoronary artery stent, 3.5 x 12 mm driver stent with no more than 20-30% in- stents restenosis.done by Dr [name] with re-look on novenber 10 2008 revealing a widely patent stent with otherwise trival CAD and normal LV function  . Carpal tunnel syndrome, right   . Chest pain 07/17/12009   2 D Echo EF >55%  . Depression   . Diabetes mellitus type II   . Gait instability   . Hyperlipidemia   . Hypertension   . Iron deficiency anemia 07/13/2016  . Obesity   . Paroxysmal A-fib ([place])   . PTSD (post-traumatic stress disorder)   . Sleep apnea     Patient Active Problem List   Diagnosis Date Noted  . AKI (acute kidney injury) ([place]) 04/11/2017  . AF (paroxysmal atrial fibrillation) ([place]) 04/11/2017  . Anticoagulated 04/11/2017  . Hyperkalemia 04/11/2017  . Rhabdomyolysis 04/11/2017  . ARF (acute renal failure) ([place]) 04/11/2017  . Iron deficiency anemia 07/13/2016  . Stroke ([place]) 11/19/2015  . Dyspnea on exertion  07/31/2015  . Abnormality of gait 05/22/2015  . Spinal stenosis in cervical region 05/22/2015  . Hypotension 07-Apr-202016  . Hyperlipidemia 07/17/2013  . Chest pain on exertion 12/14/2012  . Long term current use of anticoagulant therapy 10/03/2012  . Major depressive disorder, recurrent episode, moderate ([place]) 09/02/2011  . Menopausal and perimenopausal disorder 05/27/2011  . Depression 05/27/2011  . Urethrocele(618.03) 05/27/2011  . Anemia, iron deficiency 05/27/2011  . Paroxysmal A-fib ([place]) 05/27/2011  . Vitamin D deficiency 05/27/2011  . PTSD (post-traumatic stress disorder) 05/27/2011  . Abnormal brain MRI 05/27/2011  . PAD (peripheral artery disease) ([place]) 05/27/2011  . PFO (patent foramen ovale) 05/27/2011  . Diabetes mellitus, type II ([place])   . DM 12/16/2009  . 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 Obstructive sleep apnea 05/10/2007  . Essential hypertension 05/10/2007  . Coronary atherosclerosis 05/10/2007  . Seasonal and perennial allergic rhinitis 05/10/2007  . Asthma, mild intermittent 05/10/2007  . REFLUX, ESOPHAGEAL 05/10/2007    Past Surgical History:  Procedure Laterality Date  . CHOLECYSTECTOMY    . GALLBLADDER SURGERY    . HEMORRHOID SURGERY    . Kidney stones    . Left groin cyst    . multilevel lumbar degenerative disc disease    . stress myocardial perfusion study  09/08/2010   normal; EF 75%    OB History    No data available  Home Medications    Prior to Admission medications   Medication Sig Start Date End Date Taking? Authorizing Provider  ADVAIR DISKUS 250-50 MCG/DOSE AEPB INHALE ONE PUFF TWICE DAILY 06/03/16   Baird Lyons D, MD  albuterol (PROVENTIL HFA;VENTOLIN HFA) 108 (90 Base) MCG/ACT inhaler Inhale 1 puff into the lungs as directed.    [provider]  albuterol (VENTOLIN HFA) 108 (90 Base) MCG/ACT inhaler Inhale 1 puff into the lungs every 6 (six) hours as needed for wheezing or shortness of breath. 10/18/16   Curt Bears, MD  ARIPiprazole (ABILIFY) 5 MG tablet Take 1 tablet (5 mg total) by mouth daily. 08/25/17   Arfeen, Arlyce Harman, MD  benztropine (COGENTIN) 0.5 MG tablet Take 1 tablet (0.5 mg total) by mouth at bedtime. 08/25/17   Arfeen, Arlyce Harman, MD  feeding supplement (BOOST / RESOURCE BREEZE) LIQD Take 1 Container by mouth 3 (three) times daily between meals. 04/21/17   Ghimire, Henreitta Leber, MD  gabapentin (NEURONTIN) 300 MG capsule Take 300 mg by mouth at bedtime. 07/04/15   [provider]  hydrochlorothiazide (MICROZIDE) 12.5 MG capsule TAKE ONE CAPSULE BY MOUTH DAILY 07/01/17   Lorretta Harp, MD  HYDROcodone-acetaminophen [facility]) 10-325 MG tablet Take 1 tablet by mouth 2 (two) times daily as needed (pain). 04/21/17   Ghimire, Henreitta Leber, MD  irbesartan-hydrochlorothiazide (AVALIDE) 150-12.5 MG tablet Take 1 tablet by mouth daily. 05/18/17   Lorretta Harp, MD  isosorbide mononitrate (IMDUR) 30 MG 24 hr tablet TAKE ONE TABLET BY MOUTH EVERY DAY. 05/18/17   Lorretta Harp, MD  levofloxacin (LEVAQUIN) 500 MG tablet Take 1 tablet (500 mg total) by mouth daily. 07/27/17   Curt Bears, MD  metFORMIN (GLUCOPHAGE-XR) 500 MG 24 hr tablet TAKE ONE (1) TABLET BY MOUTH EVERY DAY WITH BREAKFAST Patient taking differently: TAKE ONE (1) TABLET (500 MG) BY MOUTH EVERY DAY WITH BREAKFAST 11/28/15   [name], MD  methylPREDNISolone (MEDROL DOSEPAK) 4 MG TBPK tablet Use as instructed 07/27/17   Curt Bears, MD  metoprolol tartrate (LOPRESSOR) 25 MG tablet Take 1 tablet (25 mg total) by mouth 2 (two) times daily. 05/12/17   Lorretta Harp, MD  mometasone (NASONEX) 50 MCG/ACT nasal spray Place 2 sprays into the nose daily. 10/23/12   Baird Lyons D, MD  nitroGLYCERIN (NITROSTAT) 0.4 MG SL tablet PLACE 1 TABLET UNDER THE TONGUE EVERY 5 MINUTES AS NEEDED FOR CHEST PAIN.  MAY REPEAT 3 TIMES.  IF NO RELIEF CALL DOCTOR 07/01/17   Lorretta Harp, MD  OXYGEN Inhale 4 L into the lungs continuous.     [provider]  PRESCRIPTION MEDICATION Inhale into the lungs at bedtime. CPAP    [provider]  QC LO-DOSE ASPIRIN 81 MG EC tablet TAKE ONE (1) TABLET BY MOUTH EVERY DAY 07/01/17   Lorretta Harp, MD  rosuvastatin (CRESTOR) 10 MG tablet TAKE ONE (1) TABLET BY MOUTH EVERY DAY Patient taking differently: TAKE ONE (1) TABLET (10 MG)  BY MOUTH EVERY DAY 07/13/16   Jaynee Eagles, PA-C  sertraline (ZOLOFT) 100 MG tablet TAKE TWO (2) TABLETS BY MOUTH DAILY 08/25/17   Arfeen, Arlyce Harman, MD  Trospium Chloride 60 MG CP24 TAKE ONE CAPSULE BY MOUTH DAILY 11/06/13   Georgiann Mccoy M, PA-C  warfarin (COUMADIN) 7.5 MG tablet TAKE ONE AND ONE-HALF TO TWO TABLETS BY MOUTH DAILY AS DIRECTED 07/01/17   Lorretta Harp, MD  SANCTURA XR 60 MG CP24 Take 1 capsule by mouth  daily. 10/20/10 03/12/13  [provider]    Family History Family History  Problem Relation Age of Onset  . Coronary artery disease Mother   . Coronary artery disease Father   . Depression Father     Social History Social History   Tobacco Use  . Smoking status: Never Smoker  . Smokeless tobacco: Never Used  Substance Use Topics  . Alcohol use: No    Alcohol/week: 0.0 oz  . Drug use: No     Allergies   Meprobamate   Review of Systems Review of Systems  Unable to perform ROS: Mental status change     Physical Exam Updated Vital Signs BP (!) 90/54 (BP Location: Left Arm)   Pulse (!) 128   Temp 100.1 F (37.8 C) (Rectal)   Resp 11   SpO2 99%   Physical Exam  Constitutional: She appears well-developed.  HENT:  Head: Atraumatic.  Eyes: Pupils are equal, round, and reactive to light.  Cardiovascular:  Regular tachycardia  Pulmonary/Chest: Effort normal.  Abdominal: There is no tenderness.  Musculoskeletal:       Right lower leg: She exhibits no tenderness.       Left lower leg: She exhibits no tenderness.  Neurological: She is alert.  Awake and able answer questions, but slow to answer.   Able to identify her daughter at the bedside.  Skin: There is pallor.     ED Treatments / Results  Labs (all labs ordered are listed, but only abnormal results are displayed) Labs Reviewed  COMPREHENSIVE METABOLIC PANEL - Abnormal; Notable for the following components:      Result Value   Sodium 131 (*)    Chloride 95 (*)    CO2 18 (*)    Glucose, Bld 269 (*)    BUN 41 (*)    Creatinine, Ser 1.14 (*)    Calcium 8.7 (*)    Total Protein 6.2 (*)    Albumin 3.4 (*)    GFR calc non Af Amer 49 (*)    GFR calc Af Amer 57 (*)    Anion gap 18 (*)    All other components within normal limits  CBC - Abnormal; Notable for the following components:   RBC 1.98 (*)    Hemoglobin 4.3 (*)    HCT 14.6 (*)    MCV 73.7 (*)    MCH 21.7 (*)    MCHC 29.5 (*)    RDW 20.6 (*)    All other components within normal limits  POC OCCULT BLOOD, ED - Abnormal; Notable for the following components:   Fecal Occult Bld POSITIVE (*)    All other components within normal limits  LIPASE, BLOOD  URINALYSIS, ROUTINE W REFLEX MICROSCOPIC  PROTIME-INR  I-STAT TROPONIN, ED    EKG  EKG Interpretation  Date/Time:  Saturday September 24 2017 19:34:34 EST Ventricular Rate:  131 PR Interval:  126 QRS Duration: 76 QT Interval:  324 QTC Calculation: 478 R Axis:   27 Text Interpretation:  Sinus tachycardia Cannot rule out Anterior infarct , age undetermined Abnormal ECG Confirmed by Davonna Belling [phone]) on 09/24/2017 8:27:11 PM       Radiology Dg Chest 2 View  Result Date: 09/24/2017 CLINICAL DATA:  Central chest pain starting 2 days ago, nausea, vomiting and diarrhea. Shortness of breath, malaise, fatigue and altered mental status. EXAM: CHEST - 2 VIEW COMPARISON:  Chest x-ray dated 04/11/2017 FINDINGS: Hypoinspiratory exam. Heart size and mediastinal contours are stable. Given the low lung  volumes, lungs appear clear. No pleural effusion or pneumothorax seen. No acute or suspicious osseous finding.  IMPRESSION: Low lung volumes. No active cardiopulmonary disease. No evidence of pneumonia or pulmonary edema. Electronically Signed   By: Franki Cabot M.D.   On: 09/24/2017 19:57    Procedures Procedures (including critical care time)  Medications Ordered in ED Medications  sodium chloride 0.9 % bolus 500 mL (not administered)     Initial Impression / Assessment and Plan / ED Course  I have reviewed the triage vital signs and the nursing notes.  Pertinent labs & imaging results that were available during my care of the patient were reviewed by me and considered in my medical decision making (see chart for details).     Patient with nausea vomiting diarrhea.  Black stool on rectal exam that is grossly guaiac positive.  Has a worsening acute on chronic anemia.  The hemoglobin of 4.  On Coumadin but INR pending.  Will transfuse and admit to hospitalist.  CRITICAL CARE Performed by: Davonna Belling Total critical care time: 30 minutes Critical care time was exclusive of separately billable procedures and treating other patients. Critical care was necessary to treat or prevent imminent or life-threatening deterioration. Critical care was time spent personally by me on the following activities: development of treatment plan with patient and/or surrogate as well as nursing, discussions with consultants, evaluation of patient's response to treatment, examination of patient, obtaining history from patient or surrogate, ordering and performing treatments and interventions, ordering and review of laboratory studies, ordering and review of radiographic studies, pulse oximetry and re-evaluation of patient's condition.   Final Clinical Impressions(s) / ED Diagnoses   Final diagnoses:  Dehydration  Anemia, unspecified type  Gastrointestinal hemorrhage, unspecified gastrointestinal hemorrhage type  Encephalopathy    ED Discharge Orders    None       Davonna Belling, MD 09/24/17  2135

## 2017-09-24 NOTE — Progress Notes (Addendum)
Pharmacy Antibiotic Note  JARAE NEMMERS is a 67 y.o. female admitted on 09/24/2017 with colitis.  Pharmacy has been consulted for Cipro dosing.  Also started on Flagyl.  Plan: Cipro 400mg  IV Q12H.  Height: 5\' 2"  (157.5 cm) Weight: 275 lb (124.7 kg) IBW/kg (Calculated) : 50.1  Temp (24hrs), Avg:98.6 F (37 C), Min:97.8 F (36.6 C), Max:100.1 F (37.8 C)  Recent Labs  Lab 09/24/17 2009 09/24/17 2219  WBC 23.9* 28.4*  CREATININE 1.14*  --     Estimated Creatinine Clearance: 61.2 mL/min (A) (by C-G formula based on SCr of 1.14 mg/dL (H)).    Allergies  Allergen Reactions  . Meprobamate Nausea And Vomiting     Thank you for allowing pharmacy to be a part of this patient's care.  Wynona Neat, PharmD, BCPS  09/24/2017 11:12 PM

## 2017-09-24 NOTE — H&P (Addendum)
History and Physical    [name]:[id number] DOB: 05/07/1951 DOA: 09/24/2017  Referring MD/NP/PA:   PCP: [name], MD   Patient coming from:  The patient is coming from home.  At baseline, pt is independent for most of ADL.  Chief Complaint: Chest pain, nausea, vomiting, abdominal pain, bloody diarrhea, altered mental status  HPI: Michele [name] is a 67 y.o. female with medical history significant of hypertension, hyperlipidemia, diabetes mellitus, asthma, stroke, depression, OSA on CPAP, atrial fibrillation on Coumadin, CAD, s/p of stent placement, PTSD, who presents with present with chest pain, nausea, vomiting, bloody diarrhea, abdominal pain, altered mental status.  Patient has AMS, appears to be a poor historian and is unable to provide accurate medical history, therefore, most of the history is obtained from daughter and sister.   Per her daughter, pt started having nausea, vomiting, bloody stool today. She vomited several times, but no blood in the vomitus noted. Her stool is dark in color. Patient seems to have mild abdominal pain. She complains of chest pain, shortness of breath, generalized fatigue. She moves all extremities normally. No facial droop and slurred speech. Patient has mild confusion at baseline, but today she is more confused than her baseline per her daughter. No neck rigidity.   ED Course: pt was found to have positive FPBT, hemoglobin dropped from 8.9 on 07/28/11 to 4.3, WBC 28.4, INR 2.92, negative troponin, lipase 26, acute renal injury with creatinine 1.14, BUN 41, potassium normal, temperature 100.1, soft blood pressure, tachycardia, no tachypnea, oxygen saturation section 99% on room air, negative chest x-ray. Patient is admitted to stepdown as inpatient.  Review of Systems: Could not be reviewed accurately due to altered mental status.  Allergy:  Allergies  Allergen Reactions  . Meprobamate Nausea And Vomiting    Past Medical History:   Diagnosis Date  . Allergic rhinitis   . ARF (acute renal failure) ([place]) 03/2017  . 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 Asthma   . CAD (coronary artery disease)   . CAD (coronary artery disease) 01/30/2007   stents trivial coronary artery disease diffusely, with a recent deployment od a intracoronary artery stent, 3.5 x 12 mm driver stent with no more than 20-30% in- stents restenosis.done by Dr [name] with re-look on novenber 10 2008 revealing a widely patent stent with otherwise trival CAD and normal LV function  . Carpal tunnel syndrome, right   . Chest pain 07/17/12009   2 D Echo EF >55%  . Depression   . Diabetes mellitus type II   . Gait instability   . Hyperlipidemia   . Hypertension   . Iron deficiency anemia 07/13/2016  . Obesity   . Paroxysmal A-fib ([place])   . PTSD (post-traumatic stress disorder)   . Sleep apnea     Past Surgical History:  Procedure Laterality Date  . CHOLECYSTECTOMY    . GALLBLADDER SURGERY    . HEMORRHOID SURGERY    . Kidney stones    . Left groin cyst    . multilevel lumbar degenerative disc disease    . stress myocardial perfusion study  09/08/2010   normal; EF 75%    Social History:  reports that  has never smoked. she has never used smokeless tobacco. She reports that she does not drink alcohol or use drugs.  Family History:  Family History  Problem Relation Age of Onset  . Coronary artery disease Mother   . Coronary artery disease Father   .  Depression Father      Prior to Admission medications   Medication Sig Start Date End Date Taking? Authorizing Provider  ADVAIR DISKUS 250-50 MCG/DOSE AEPB INHALE ONE PUFF TWICE DAILY 06/03/16   Baird Lyons D, MD  albuterol (PROVENTIL HFA;VENTOLIN HFA) 108 (90 Base) MCG/ACT inhaler Inhale 1 puff into the lungs as directed.    [provider]  albuterol (VENTOLIN HFA) 108 (90 Base) MCG/ACT inhaler Inhale 1 puff into the lungs every 6 (six) hours as needed for wheezing or  shortness of breath. 10/18/16   Curt Bears, MD  ARIPiprazole (ABILIFY) 5 MG tablet Take 1 tablet (5 mg total) by mouth daily. 08/25/17   Arfeen, Arlyce Harman, MD  benztropine (COGENTIN) 0.5 MG tablet Take 1 tablet (0.5 mg total) by mouth at bedtime. 08/25/17   Arfeen, Arlyce Harman, MD  feeding supplement (BOOST / RESOURCE BREEZE) LIQD Take 1 Container by mouth 3 (three) times daily between meals. 04/21/17   Ghimire, Henreitta Leber, MD  gabapentin (NEURONTIN) 300 MG capsule Take 300 mg by mouth at bedtime. 07/04/15   [provider]  hydrochlorothiazide (MICROZIDE) 12.5 MG capsule TAKE ONE CAPSULE BY MOUTH DAILY 07/01/17   Lorretta Harp, MD  HYDROcodone-acetaminophen [facility]) 10-325 MG tablet Take 1 tablet by mouth 2 (two) times daily as needed (pain). 04/21/17   Ghimire, Henreitta Leber, MD  irbesartan-hydrochlorothiazide (AVALIDE) 150-12.5 MG tablet Take 1 tablet by mouth daily. 05/18/17   Lorretta Harp, MD  isosorbide mononitrate (IMDUR) 30 MG 24 hr tablet TAKE ONE TABLET BY MOUTH EVERY DAY. 05/18/17   Lorretta Harp, MD  levofloxacin (LEVAQUIN) 500 MG tablet Take 1 tablet (500 mg total) by mouth daily. 07/27/17   Curt Bears, MD  metFORMIN (GLUCOPHAGE-XR) 500 MG 24 hr tablet TAKE ONE (1) TABLET BY MOUTH EVERY DAY WITH BREAKFAST Patient taking differently: TAKE ONE (1) TABLET (500 MG) BY MOUTH EVERY DAY WITH BREAKFAST 11/28/15   [name], MD  methylPREDNISolone (MEDROL DOSEPAK) 4 MG TBPK tablet Use as instructed 07/27/17   Curt Bears, MD  metoprolol tartrate (LOPRESSOR) 25 MG tablet Take 1 tablet (25 mg total) by mouth 2 (two) times daily. 05/12/17   Lorretta Harp, MD  mometasone (NASONEX) 50 MCG/ACT nasal spray Place 2 sprays into the nose daily. 10/23/12   Baird Lyons D, MD  nitroGLYCERIN (NITROSTAT) 0.4 MG SL tablet PLACE 1 TABLET UNDER THE TONGUE EVERY 5 MINUTES AS NEEDED FOR CHEST PAIN.  MAY REPEAT 3 TIMES.  IF NO RELIEF CALL DOCTOR 07/01/17   Lorretta Harp, MD  OXYGEN Inhale 4  L into the lungs continuous.    [provider]  PRESCRIPTION MEDICATION Inhale into the lungs at bedtime. CPAP    [provider]  QC LO-DOSE ASPIRIN 81 MG EC tablet TAKE ONE (1) TABLET BY MOUTH EVERY DAY 07/01/17   Lorretta Harp, MD  rosuvastatin (CRESTOR) 10 MG tablet TAKE ONE (1) TABLET BY MOUTH EVERY DAY Patient taking differently: TAKE ONE (1) TABLET (10 MG)  BY MOUTH EVERY DAY 07/13/16   Jaynee Eagles, PA-C  sertraline (ZOLOFT) 100 MG tablet TAKE TWO (2) TABLETS BY MOUTH DAILY 08/25/17   Arfeen, Arlyce Harman, MD  Trospium Chloride 60 MG CP24 TAKE ONE CAPSULE BY MOUTH DAILY 11/06/13   Georgiann Mccoy M, PA-C  warfarin (COUMADIN) 7.5 MG tablet TAKE ONE AND ONE-HALF TO TWO TABLETS BY MOUTH DAILY AS DIRECTED 07/01/17   Lorretta Harp, MD  SANCTURA XR 60 MG CP24 Take 1 capsule  by mouth daily. 10/20/10 03/12/13  [provider]    Physical Exam: Vitals:   09/24/17 2243 09/24/17 2300 09/24/17 2302 09/25/17 0000  BP: 121/67 123/73 123/73 104/61  Pulse: (!) 111  (!) 111   Resp: (!) 26 (!) 24 (!) 24 (!) 24  Temp: 98.1 F (36.7 C)  97.8 F (36.6 C)   TempSrc: Oral     SpO2: 99%  100%   Weight:   124.7 kg (275 lb)   Height:   5' 2"  (1.575 m)    General: Not in acute distress. Pale looking. HEENT:       Eyes: PERRL, EOMI, no scleral icterus.       ENT: No discharge from the ears and nose, no pharynx injection, no tonsillar enlargement.        Neck: No JVD, no bruit, no mass felt. Heme: No neck lymph node enlargement. Cardiac: S1/S2, RRR, No murmurs, No gallops or rubs. Respiratory: No rales, wheezing, rhonchi or rubs. GI: Soft, nondistended, has mild diffused tenderness, no organomegaly, BS present. GU: No hematuria Ext: No pitting leg edema bilaterally. 2+DP/PT pulse bilaterally. Musculoskeletal: No joint deformities, No joint redness or warmth, no limitation of ROM in spin. Skin: No rashes.  Neuro: confused, knows her own name and recognizes her daughter and  sister, not oriented to time and place. cranial nerves II-XII grossly intact, moves all extremities. Neck is supple.  Psych: Patient is not psychotic, no suicidal or hemocidal ideation.  Labs on Admission: I have personally reviewed following labs and imaging studies  CBC: Recent Labs  Lab 09/24/17 2009 09/24/17 2219  WBC 23.9* 28.4*  HGB 4.3* 4.4*  HCT 14.6* 14.7*  MCV 73.7* 75.0*  PLT 352 350   Basic Metabolic Panel: Recent Labs  Lab 09/24/17 2009  NA 131*  K 4.1  CL 95*  CO2 18*  GLUCOSE 269*  BUN 41*  CREATININE 1.14*  CALCIUM 8.7*   GFR: Estimated Creatinine Clearance: 61.2 mL/min (A) (by C-G formula based on SCr of 1.14 mg/dL (H)). Liver Function Tests: Recent Labs  Lab 09/24/17 2009  AST 28  ALT 17  ALKPHOS 61  BILITOT 0.5  PROT 6.2*  ALBUMIN 3.4*   Recent Labs  Lab 09/24/17 2009  LIPASE 26   No results for input(s): AMMONIA in the last 168 hours. Coagulation Profile: Recent Labs  Lab 09/24/17 2110  INR 2.92   Cardiac Enzymes: No results for input(s): CKTOTAL, CKMB, CKMBINDEX, TROPONINI in the last 168 hours. BNP (last 3 results) No results for input(s): PROBNP in the last 8760 hours. HbA1C: No results for input(s): HGBA1C in the last 72 hours. CBG: No results for input(s): GLUCAP in the last 168 hours. Lipid Profile: No results for input(s): CHOL, HDL, LDLCALC, TRIG, CHOLHDL, LDLDIRECT in the last 72 hours. Thyroid Function Tests: No results for input(s): TSH, T4TOTAL, FREET4, T3FREE, THYROIDAB in the last 72 hours. Anemia Panel: No results for input(s): VITAMINB12, FOLATE, FERRITIN, TIBC, IRON, RETICCTPCT in the last 72 hours. Urine analysis:    Component Value Date/Time   COLORURINE YELLOW 04/11/2017 2200   APPEARANCEUR HAZY (A) 04/11/2017 2200   LABSPEC 1.016 04/11/2017 2200   LABSPEC 1.025 06/23/2007 0832   PHURINE 5.0 04/11/2017 2200   GLUCOSEU NEGATIVE 04/11/2017 2200   HGBUR NEGATIVE 04/11/2017 2200   BILIRUBINUR NEGATIVE  04/11/2017 2200   BILIRUBINUR negative 04/28/2015 1157   BILIRUBINUR neg 12/08/2014 1227   BILIRUBINUR Negative 06/23/2007 [place] 04/11/2017 2200   PROTEINUR  NEGATIVE 04/11/2017 2200   UROBILINOGEN 0.2 04/28/2015 1157   UROBILINOGEN 0.2 01/30/2009 0628   NITRITE NEGATIVE 04/11/2017 2200   LEUKOCYTESUR NEGATIVE 04/11/2017 2200   LEUKOCYTESUR Negative 06/23/2007 0832   Sepsis Labs: @LABRCNTIP (procalcitonin:4,lacticidven:4) )No results found for this or any previous visit (from the past 240 hour(s)).   Radiological Exams on Admission: Dg Chest 2 View  Result Date: 09/24/2017 CLINICAL DATA:  Central chest pain starting 2 days ago, nausea, vomiting and diarrhea. Shortness of breath, malaise, fatigue and altered mental status. EXAM: CHEST - 2 VIEW COMPARISON:  Chest x-ray dated 04/11/2017 FINDINGS: Hypoinspiratory exam. Heart size and mediastinal contours are stable. Given the low lung volumes, lungs appear clear. No pleural effusion or pneumothorax seen. No acute or suspicious osseous finding. IMPRESSION: Low lung volumes. No active cardiopulmonary disease. No evidence of pneumonia or pulmonary edema. Electronically Signed   By: Franki Cabot M.D.   On: 09/24/2017 19:57     EKG: Independently reviewed.  Sinus rhythm, QTC 469, low voltage, nonspecific T-wave change.  Assessment/Plan Principal Problem:   GIB (gastrointestinal bleeding) Active Problems:   Essential hypertension   Asthma, mild intermittent   Paroxysmal A-fib (HCC)   Major depressive disorder, recurrent episode, moderate (HCC)   Hyperlipidemia   Stroke (HCC)   Iron deficiency anemia   AKI (acute kidney injury) (HCC)   Symptomatic anemia   Acute metabolic encephalopathy   Nausea vomiting and diarrhea   Sepsis ([place])   GIB (gastrointestinal bleeding): hgb 8.9 on 07/27/17-->4.3. Currently hemodynamically stable. Chart review revealed that the patient had negative colonoscopy 9 years ago. Now she has  bloody stool diarrhea, with INR 2.92. I  consulted Dr. Watt Climes of GI, will see pt in AM.  - will admit toSDU as inpt - reverse INR with Vk 39m by IV and 2 U of FFP - transfuse 4 units of blood now - will give 40 mg of IV lasix after first U of blood - NPO - IVF: 1.5L NS bolus, then at 100 mL/hr - Start IV pantoprazole gtt - Zofran IV for nausea - Avoid NSAIDs and SQ heparin - Maintain IV access (2 large bore IVs if possible). - Monitor closely and follow q6h cbc, transfuse as necessary, if Hgb<7.0 - LaB: INR, PTT and type screen  Atrial Fibrillation: CHA2DS2-VASc Score is 7, needs oral anticoagulation. Patient is on Coumadin. INR is 2.92 on admission. Heart rate is 111-120s -hold oral metoprolol due to altered mental status -prn IV metoprolol  -Hold her Coumadin  Chest pain: Most likely due to demand ischemia secondary to severe anemia. - cycle CE q6 x3 and repeat EKG in the am  - prn Morphine - Risk factor stratification: will check FLP and A1C  - 2d echo - cannot give oral meds now due to AMS.  Essential hypertension:  -hold oral med due to altered mental status -IV hydralazine when necessary  Asthma, mild intermittent: stable. No wheezing. -prn Xopenex nebs  Depression: no suicidal or homicidal ideations. -hold oral medications unitl mental status improves.  HLD: -hold crestor unitl mental status improves.  Stroke ([facility]: -hold crestor unitl mental status improves.  Mild AKI:  Cre 1.14, bun 41, gfr 57. Likely due to prerenal secondary to dehydration and continuation of aRB, diuretics - IVF as above - Follow up renal function by BMP - Hold Avalide  Symptomatic anemia due to Iron deficiency and blood loss: -Blood transfusion as above  Sepsis: Patient meets criteria for sepsis with leukocytosis, tachycardia, fever. Pending lactic acid. Currently  hemodynamically stable. Etiology is not clear. Chest x-rays negative. Due to nausea, vomiting, bloody diarrhea, colitis is a  potential differential diagnosis. Will also need to rule out urinary tract infection, pending urinalysis. Pt's neck is supple, low suspicion for meningitis.  -will get c diff pcr -CT-abd/pelvis -start Cipro and Flagyl empirically -Follow-up blood culture -f/u UA and Ux  Acute metabolic encephalopathy: Likely multifactorial etiology, including sepsis, GI bleeding, worsening renal function -CT-head  DVT ppx: SCD Code Status: Full code Family Communication:  Yes, patient's daughter and sister at bed side Disposition Plan:  Anticipate discharge back to previous home environment Consults called:  GI, Dr. Watt Climes Admission status:   SDU/inpation       Date of Service 09/25/2017    [facility] Pager [phone]  If 7PM-7AM, please contact night-coverage [email or web address] Password [facility] 09/25/2017, 12:42 AM

## 2017-09-24 NOTE — ED Triage Notes (Signed)
Patient presents to ED for assessment of central chest pain starting two days ago, nausea, vomiting and diarrhea.  C/o shortness of breath, malaise, fatigue, and altered mental status (less responsive than normal)

## 2017-09-25 ENCOUNTER — Inpatient Hospital Stay (HOSPITAL_COMMUNITY): Payer: Medicare Other

## 2017-09-25 ENCOUNTER — Inpatient Hospital Stay: Payer: Self-pay

## 2017-09-25 ENCOUNTER — Inpatient Hospital Stay (HOSPITAL_COMMUNITY): Payer: Medicare Other | Admitting: Certified Registered"

## 2017-09-25 ENCOUNTER — Other Ambulatory Visit: Payer: Self-pay

## 2017-09-25 ENCOUNTER — Encounter (HOSPITAL_COMMUNITY): Payer: Self-pay | Admitting: Certified Registered"

## 2017-09-25 ENCOUNTER — Encounter (HOSPITAL_COMMUNITY)
Admission: EM | Disposition: A | Discharge status: Home Health | Payer: Self-pay | Source: Home / Self Care | Attending: Internal Medicine

## 2017-09-25 DIAGNOSIS — E860 Dehydration: Secondary | ICD-10-CM

## 2017-09-25 DIAGNOSIS — I480 Paroxysmal atrial fibrillation: Secondary | ICD-10-CM

## 2017-09-25 DIAGNOSIS — D62 Acute posthemorrhagic anemia: Secondary | ICD-10-CM

## 2017-09-25 HISTORY — PX: ESOPHAGOGASTRODUODENOSCOPY: SHX5428

## 2017-09-25 LAB — CBC
HEMATOCRIT: 23.4 % — AB (ref 36.0–46.0)
Hemoglobin: 7.6 g/dL — ABNORMAL LOW (ref 12.0–15.0)
MCH: 26.3 pg (ref 26.0–34.0)
MCHC: 32.5 g/dL (ref 30.0–36.0)
MCV: 81 fL (ref 78.0–100.0)
PLATELETS: 263 10*3/uL (ref 150–400)
RBC: 2.89 MIL/uL — ABNORMAL LOW (ref 3.87–5.11)
RDW: 19.6 % — AB (ref 11.5–15.5)
WBC: 25.7 10*3/uL — ABNORMAL HIGH (ref 4.0–10.5)

## 2017-09-25 LAB — URINALYSIS, ROUTINE W REFLEX MICROSCOPIC
BILIRUBIN URINE: NEGATIVE
Bacteria, UA: NONE SEEN
GLUCOSE, UA: NEGATIVE mg/dL
Ketones, ur: NEGATIVE mg/dL
LEUKOCYTES UA: NEGATIVE
NITRITE: NEGATIVE
PH: 5 (ref 5.0–8.0)
Protein, ur: NEGATIVE mg/dL
SPECIFIC GRAVITY, URINE: 1.009 (ref 1.005–1.030)

## 2017-09-25 LAB — LIPID PANEL
CHOLESTEROL: 103 mg/dL (ref 0–200)
HDL: 31 mg/dL — AB (ref >40)
LDL Cholesterol: 40 mg/dL (ref 0–99)
TRIGLYCERIDES: 160 mg/dL — AB (ref <150)
Total CHOL/HDL Ratio: 3.3 RATIO
VLDL: 32 mg/dL (ref 0–40)

## 2017-09-25 LAB — BASIC METABOLIC PANEL
Anion gap: 16 — ABNORMAL HIGH (ref 5–15)
BUN: 34 mg/dL — ABNORMAL HIGH (ref 6–20)
CALCIUM: 8.1 mg/dL — AB (ref 8.9–10.3)
CO2: 22 mmol/L (ref 22–32)
Chloride: 96 mmol/L — ABNORMAL LOW (ref 101–111)
Creatinine, Ser: 0.92 mg/dL (ref 0.44–1.00)
GFR calc Af Amer: >60 mL/min (ref >60)
Glucose, Bld: 138 mg/dL — ABNORMAL HIGH (ref 65–99)
Potassium: 3.6 mmol/L (ref 3.5–5.1)
SODIUM: 134 mmol/L — AB (ref 135–145)

## 2017-09-25 LAB — ECHOCARDIOGRAM COMPLETE
HEIGHTINCHES: 62 in
Weight: 4400 oz

## 2017-09-25 LAB — TROPONIN I
TROPONIN I: 0.03 ng/mL — AB (ref <0.03)
Troponin I: 0.04 ng/mL (ref <0.03)

## 2017-09-25 LAB — GLUCOSE, CAPILLARY
Glucose-Capillary: 108 mg/dL — ABNORMAL HIGH (ref 65–99)
Glucose-Capillary: 110 mg/dL — ABNORMAL HIGH (ref 65–99)
Glucose-Capillary: 120 mg/dL — ABNORMAL HIGH (ref 65–99)
Glucose-Capillary: 115 mg/dL — ABNORMAL HIGH (ref 65–99)

## 2017-09-25 LAB — PROTIME-INR
INR: 1.4
PROTHROMBIN TIME: 17 s — AB (ref 11.4–15.2)

## 2017-09-25 LAB — HEMOGLOBIN A1C
Hgb A1c MFr Bld: 4.9 % (ref 4.8–5.6)
MEAN PLASMA GLUCOSE: 93.93 mg/dL

## 2017-09-25 LAB — ABO/RH: ABO/RH(D): O POS

## 2017-09-25 LAB — MRSA PCR SCREENING: MRSA BY PCR: NEGATIVE

## 2017-09-25 LAB — LACTIC ACID, PLASMA: Lactic Acid, Venous: 2.1 mmol/L (ref 0.5–1.9)

## 2017-09-25 LAB — PROCALCITONIN: PROCALCITONIN: 0.52 ng/mL

## 2017-09-25 SURGERY — EGD (ESOPHAGOGASTRODUODENOSCOPY)
Anesthesia: Monitor Anesthesia Care

## 2017-09-25 MED ORDER — SODIUM CHLORIDE 0.9 % IV SOLN
INTRAVENOUS | Status: DC
  Administered 2017-09-25: 11:00:00 via INTRAVENOUS

## 2017-09-25 MED ORDER — METOPROLOL TARTRATE 25 MG PO TABS
25.0000 mg | ORAL_TABLET | Freq: Two times a day (BID) | ORAL | Status: DC
  Administered 2017-09-25 – 2017-09-29 (×8): 25 mg via ORAL
  Filled 2017-09-25 (×8): qty 1

## 2017-09-25 MED ORDER — PANTOPRAZOLE SODIUM 40 MG PO TBEC
40.0000 mg | DELAYED_RELEASE_TABLET | Freq: Every day | ORAL | Status: DC
  Administered 2017-09-25 – 2017-09-29 (×5): 40 mg via ORAL
  Filled 2017-09-25 (×5): qty 1

## 2017-09-25 MED ORDER — LACTATED RINGERS IV SOLN
INTRAVENOUS | Status: DC
  Administered 2017-09-25: 11:00:00 via INTRAVENOUS

## 2017-09-25 MED ORDER — NITROGLYCERIN 0.4 MG SL SUBL
0.4000 mg | SUBLINGUAL_TABLET | SUBLINGUAL | Status: DC | PRN
  Administered 2017-09-26 (×3): 0.4 mg via SUBLINGUAL
  Filled 2017-09-25: qty 1

## 2017-09-25 MED ORDER — SUCCINYLCHOLINE CHLORIDE 200 MG/10ML IV SOSY
PREFILLED_SYRINGE | INTRAVENOUS | Status: DC | PRN
  Administered 2017-09-25: 120 mg via INTRAVENOUS

## 2017-09-25 MED ORDER — LEVALBUTEROL HCL 1.25 MG/0.5ML IN NEBU
1.2500 mg | INHALATION_SOLUTION | Freq: Four times a day (QID) | RESPIRATORY_TRACT | Status: DC
  Filled 2017-09-25 (×2): qty 0.5

## 2017-09-25 MED ORDER — SERTRALINE HCL 100 MG PO TABS
100.0000 mg | ORAL_TABLET | Freq: Every day | ORAL | Status: DC
  Administered 2017-09-25 – 2017-09-29 (×5): 100 mg via ORAL
  Filled 2017-09-25 (×5): qty 1

## 2017-09-25 MED ORDER — BENZTROPINE MESYLATE 0.5 MG PO TABS
0.5000 mg | ORAL_TABLET | Freq: Every day | ORAL | Status: DC
  Administered 2017-09-26 – 2017-09-28 (×3): 0.5 mg via ORAL
  Filled 2017-09-25 (×4): qty 1

## 2017-09-25 MED ORDER — ARIPIPRAZOLE 5 MG PO TABS
5.0000 mg | ORAL_TABLET | Freq: Every day | ORAL | Status: DC
  Administered 2017-09-25 – 2017-09-29 (×5): 5 mg via ORAL
  Filled 2017-09-25 (×5): qty 1

## 2017-09-25 MED ORDER — LEVALBUTEROL HCL 1.25 MG/0.5ML IN NEBU: 1.2500 mg | INHALATION_SOLUTION | Freq: Four times a day (QID) | RESPIRATORY_TRACT | Status: DC | PRN

## 2017-09-25 MED ORDER — INSULIN ASPART 100 UNIT/ML ~~LOC~~ SOLN
0.0000 [IU] | SUBCUTANEOUS | Status: DC
  Administered 2017-09-29: 2 [IU] via SUBCUTANEOUS

## 2017-09-25 MED ORDER — ONDANSETRON HCL 4 MG/2ML IJ SOLN
INTRAMUSCULAR | Status: DC | PRN
  Administered 2017-09-25: 4 mg via INTRAVENOUS

## 2017-09-25 MED ORDER — MORPHINE SULFATE (PF) 2 MG/ML IV SOLN
2.0000 mg | Freq: Once | INTRAVENOUS | Status: AC
  Administered 2017-09-26: 2 mg via INTRAVENOUS
  Filled 2017-09-25: qty 1

## 2017-09-25 MED ORDER — LIDOCAINE 2% (20 MG/ML) 5 ML SYRINGE
INTRAMUSCULAR | Status: DC | PRN
  Administered 2017-09-25: 60 mg via INTRAVENOUS

## 2017-09-25 MED ORDER — PANTOPRAZOLE SODIUM 40 MG PO TBEC: 40.0000 mg | DELAYED_RELEASE_TABLET | Freq: Every day | ORAL | Status: DC

## 2017-09-25 MED ORDER — PROPOFOL 10 MG/ML IV BOLUS
INTRAVENOUS | Status: DC | PRN
  Administered 2017-09-25: 100 mg via INTRAVENOUS
  Administered 2017-09-25: 140 mg via INTRAVENOUS

## 2017-09-25 NOTE — ED Notes (Signed)
Ordered hospital bed 

## 2017-09-25 NOTE — ED Notes (Signed)
Starling Manns ( Daughter) [phone]; Please call daughter with any updates or when patient is assigned a room;

## 2017-09-25 NOTE — Progress Notes (Signed)
Patient transported to Endoscopy via bed.  Daughter with patient to assist with consent.

## 2017-09-25 NOTE — Progress Notes (Signed)
[facility]  Contacted Dr Dwyane Dee to request central line. Then contacted RN on behalf of Dr Dwyane Dee to have size 8 gloves, central line kit and consent ready for him to perform a  line.   Debbe Odea, MD

## 2017-09-25 NOTE — Anesthesia Preprocedure Evaluation (Addendum)
Anesthesia Evaluation  Patient identified by MRN, date of birth, ID band Patient awake    Reviewed: Allergy & Precautions, NPO status   Airway Mallampati: II  TM Distance: >3 FB     Dental   Pulmonary asthma , sleep apnea ,    breath sounds clear to auscultation       Cardiovascular hypertension, + CAD and + Peripheral Vascular Disease   Rhythm:Regular Rate:Normal     Neuro/Psych    GI/Hepatic Neg liver ROS, GERD  ,  Endo/Other  diabetes  Renal/GU Renal disease     Musculoskeletal  (+) Arthritis ,   Abdominal   Peds  Hematology  (+) anemia ,   Anesthesia Other Findings   Reproductive/Obstetrics                             Anesthesia Physical Anesthesia Plan  ASA: III  Anesthesia Plan: General   Post-op Pain Management:    Induction: Intravenous, Rapid sequence and Cricoid pressure planned  PONV Risk Score and Plan: 2 and Treatment may vary due to age or medical condition  Airway Management Planned: Oral ETT  Additional Equipment:   Intra-op Plan:   Post-operative Plan: Possible Post-op intubation/ventilation  Informed Consent: I have reviewed the patients History and Physical, chart, labs and discussed the procedure including the risks, benefits and alternatives for the proposed anesthesia with the patient or authorized representative who has indicated his/her understanding and acceptance.   Dental advisory given  Plan Discussed with: CRNA, Anesthesiologist and Surgeon  Anesthesia Plan Comments:        Anesthesia Quick Evaluation

## 2017-09-25 NOTE — Anesthesia Procedure Notes (Signed)
Procedure Name: Intubation Date/Time: 09/25/2017 11:48 AM Performed by: Myna Bright, CRNA Pre-anesthesia Checklist: Patient identified, Emergency Drugs available, Suction available and Patient being monitored Patient Re-evaluated:Patient Re-evaluated prior to induction Oxygen Delivery Method: Circle system utilized Preoxygenation: Pre-oxygenation with 100% oxygen Induction Type: IV induction, Rapid sequence and Cricoid Pressure applied Laryngoscope Size: Glidescope and 4 Tube type: Oral Tube size: 7.5 mm Number of attempts: 1 Airway Equipment and Method: Stylet Placement Confirmation: ETT inserted through vocal cords under direct vision,  positive ETCO2 and breath sounds checked- equal and bilateral Secured at: 22 cm Tube secured with: Tape Dental Injury: Teeth and Oropharynx as per pre-operative assessment

## 2017-09-25 NOTE — ED Notes (Signed)
Per RN pt still receiving blood and have 2 more units to receive.

## 2017-09-25 NOTE — Anesthesia Postprocedure Evaluation (Signed)
Anesthesia Post Note  Patient: Michele [name]  Procedure(s) Performed: ESOPHAGOGASTRODUODENOSCOPY (EGD) (N/A )     Patient location during evaluation: Endoscopy Anesthesia Type: MAC Level of consciousness: awake Pain management: pain level controlled Vital Signs Assessment: post-procedure vital signs reviewed and stable Respiratory status: spontaneous breathing Cardiovascular status: stable Anesthetic complications: no    Last Vitals:  Vitals:   09/25/17 1245 09/25/17 1317  BP:  137/88  Pulse: (!) 104   Resp:  15  Temp: 36.7 C 37 C  SpO2: 100%     Last Pain:  Vitals:   09/25/17 1317  TempSrc: Oral  PainSc:                  Sedalia Greeson

## 2017-09-25 NOTE — Op Note (Signed)
[facility] Patient Name: Michele [name] Procedure Date : 09/25/2017 MRN: [id number] Attending MD: Clarene Essex , MD Date of Birth: 07-Dec-1950 CSN: [id number] Age: 67 Admit Type: Inpatient Procedure:                Upper GI endoscopy Indications:              Hematemesis, Melena Providers:                Clarene Essex, MD, Vista Lawman, RN, Laurena Spies,                            Technician, Nevin Bloodgood, Technician, Barrett Henle, CRNA Referring MD:              Medicines:                General Anesthesia Complications:            No immediate complications. Estimated Blood Loss:     Estimated blood loss: none. Estimated blood loss:                            none. Procedure:                Pre-Anesthesia Assessment:                           - Prior to the procedure, a History and Physical                            was performed, and patient medications and                            allergies were reviewed. The patient's tolerance of                            previous anesthesia was also reviewed. The risks                            and benefits of the procedure and the sedation                            options and risks were discussed with the patient.                            All questions were answered, and informed consent                            was obtained. Prior Anticoagulants: The patient has                            taken Coumadin (warfarin), last dose was 1 day                            prior to procedure. ASA Grade  Assessment: III - A                            patient with severe systemic disease. After                            reviewing the risks and benefits, the patient was                            deemed in satisfactory condition to undergo the                            procedure.                           After obtaining informed consent, the endoscope was                            passed under  direct vision. Throughout the                            procedure, the patient's blood pressure, pulse, and                            oxygen saturations were monitored continuously. The                            ([id number]) [id number] was introduced through the                            mouth, and advanced to the third part of duodenum.                            The upper GI endoscopy was accomplished without                            difficulty. The patient tolerated the procedure                            well. Scope In: Scope Out: Findings:      The examined esophagus was normal.      Localized mild inflammation characterized by erythema was found in the       gastric antrum.      A medium diverticulum was found in the third portion of the duodenum.      Clotted blood And dark material was found on the greater curvature of       the stomach which was all washed and suctioned.      The exam was otherwise without abnormality.      Clotted blood was found in the third portion of the duodenum. which was       washed into the distal duodenum without obvious underlying lesion Impression:               - Normal esophagus.                           -  Gastritis.                           - Duodenal diverticulum.                           - Clotted blood in the greater curvature of the                            stomach.                           - The examination was otherwise normal.                           - Blood in the third portion of the duodenum.                           - No specimens collected. no signs of active                            bleeding Moderate Sedation:      moderate sedation-none Recommendation:           - Patient has a contact number available for                            emergencies. The signs and symptoms of potential                            delayed complications were discussed with the                            patient. Return to normal activities  tomorrow.                            Written discharge instructions were provided to the                            patient.                           - Clear liquid diet today. will use oral once a day                            pump inhibitor and she will need repeat evaluation                            on true blood thinner and aspirin needs but doubt                            she needs both Coumadin and aspirin going forward                           - Continue present medications. observe for further  bleeding but with old blood seen in the stomach do                            not think distal lesion is responsible                           - Return to GI clinic PRN. expect hemoglobin to                            drop some with BUN and creatinine dropping as well                           - Telephone GI clinic if symptomatic PRN. Procedure Code(s):        --- Professional ---                           [phone], Esophagogastroduodenoscopy, flexible,                            transoral; diagnostic, including collection of                            specimen(s) by brushing or washing, when performed                            (separate procedure) Diagnosis Code(s):        --- Professional ---                           K29.70, Gastritis, unspecified, without bleeding                           K92.2, Gastrointestinal hemorrhage, unspecified                           K92.0, Hematemesis                           K92.1, Melena (includes Hematochezia)                           K57.10, Diverticulosis of small intestine without                            perforation or abscess without bleeding CPT copyright 2016 [employer]. All rights reserved. The codes documented in this report are preliminary and upon coder review may  be revised to meet current compliance requirements. Clarene Essex, MD 09/25/2017 12:21:17 PM This report has been signed  electronically. Number of Addenda: 0

## 2017-09-25 NOTE — Progress Notes (Signed)
After the completion of PRBC (3rd total unit), patient had accidentally pulled out EJ IV.  Attempted to insert peripheral IV x 2, but unsuccessful.  Notified Dr. Wynelle Cleveland.

## 2017-09-25 NOTE — Consult Note (Addendum)
Reason for Consult: GI bleeding Referring Physician: Hospital team  Michele [name] is an 67 y.o. female.  HPI: Patient seen and examined and case discussed with her daughter and the hospital team and the anesthesia team and she has not had much GI problems until the last 2 days with increased black bowel movements and some dark vomiting and she's had some increased altered mental status worse than her baseline and she did have a colonoscopy 10 years ago and has a history of ulcers and hemorrhoids but no other obvious GI problems and she is on both Coumadin and aspirin at home but no extra over-the-counter nonsteroidals and has no other specific complaints  Past Medical History:  Diagnosis Date  . Allergic rhinitis   . ARF (acute renal failure) ([place]) 03/2017  . 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 Asthma   . CAD (coronary artery disease)   . CAD (coronary artery disease) 01/30/2007   stents trivial coronary artery disease diffusely, with a recent deployment od a intracoronary artery stent, 3.5 x 12 mm driver stent with no more than 20-30% in- stents restenosis.done by Dr [name] with re-look on novenber 10 2008 revealing a widely patent stent with otherwise trival CAD and normal LV function  . Carpal tunnel syndrome, right   . Chest pain 07/17/12009   2 D Echo EF >55%  . Complication of anesthesia    slow to wake up  . Depression   . Diabetes mellitus type II   . Gait instability   . Hyperlipidemia   . Hypertension   . Iron deficiency anemia 07/13/2016  . Obesity   . Paroxysmal A-fib ([place])   . PTSD (post-traumatic stress disorder)   . Sleep apnea     Past Surgical History:  Procedure Laterality Date  . CHOLECYSTECTOMY    . GALLBLADDER SURGERY    . HEMORRHOID SURGERY    . Kidney stones    . Left groin cyst    . multilevel lumbar degenerative disc disease    . stress myocardial perfusion study  09/08/2010   normal; EF 75%    Family History  Problem Relation  Age of Onset  . Coronary artery disease Mother   . Coronary artery disease Father   . Depression Father     Social History:  reports that  has never smoked. she has never used smokeless tobacco. She reports that she does not drink alcohol or use drugs.  Allergies:  Allergies  Allergen Reactions  . Meprobamate Nausea And Vomiting    Medications: I have reviewed the patient's current medications.  Results for orders placed or performed during the hospital encounter of 09/24/17 (from the past 48 hour(s))  Lipase, blood     Status: None   Collection Time: 09/24/17  8:09 PM  Result Value Ref Range   Lipase 26 11 - 51 U/L    Comment: Performed at [facility], [place] [address], [place], [place] [address]  Comprehensive metabolic panel     Status: Abnormal   Collection Time: 09/24/17  8:09 PM  Result Value Ref Range   Sodium 131 (L) 135 - 145 mmol/L   Potassium 4.1 3.5 - 5.1 mmol/L   Chloride 95 (L) 101 - 111 mmol/L   CO2 18 (L) 22 - 32 mmol/L   Glucose, Bld 269 (H) 65 - 99 mg/dL   BUN 41 (H) 6 - 20 mg/dL   Creatinine, Ser 1.14 (H) 0.44 - 1.00 mg/dL  Calcium 8.7 (L) 8.9 - 10.3 mg/dL   Total Protein 6.2 (L) 6.5 - 8.1 g/dL   Albumin 3.4 (L) 3.5 - 5.0 g/dL   AST 28 15 - 41 U/L   ALT 17 14 - 54 U/L   Alkaline Phosphatase 61 38 - 126 U/L   Total Bilirubin 0.5 0.3 - 1.2 mg/dL   GFR calc non Af Amer 49 (L) >60 mL/min   GFR calc Af Amer 57 (L) >60 mL/min    Comment: (NOTE) The eGFR has been calculated using the CKD EPI equation. This calculation has not been validated in all clinical situations. eGFR's persistently <60 mL/min signify possible Chronic Kidney Disease.    Anion gap 18 (H) 5 - 15    Comment: Performed at [facility], [place] [address], [place] [address]  CBC     Status: Abnormal   Collection Time: 09/24/17  8:09 PM  Result Value Ref Range   WBC 23.9 (H) 4.0 - 10.5 K/uL    Comment: REPEATED TO VERIFY WHITE COUNT CONFIRMED ON SMEAR    RBC 1.98 (L)  3.87 - 5.11 MIL/uL   Hemoglobin 4.3 (LL) 12.0 - 15.0 g/dL    Comment: REPEATED TO VERIFY CRITICAL RESULT CALLED TO, READ BACK BY AND VERIFIED WITH: MATT CABLE,RN AT 2058 09/24/17 BY ZBEECH.    HCT 14.6 (L) 36.0 - 46.0 %   MCV 73.7 (L) 78.0 - 100.0 fL   MCH 21.7 (L) 26.0 - 34.0 pg   MCHC 29.5 (L) 30.0 - 36.0 g/dL   RDW 20.6 (H) 11.5 - 15.5 %   Platelets 352 150 - 400 K/uL    Comment: REPEATED TO VERIFY PLATELET COUNT CONFIRMED BY SMEAR Performed at [facility] [address] [place], [place] [address]   I-Stat Troponin, ED (not at [facility])     Status: None   Collection Time: 09/24/17  8:23 PM  Result Value Ref Range   Troponin i, poc 0.01 0.00 - 0.08 ng/mL   Comment 3            Comment: Due to the release kinetics of cTnI, a negative result within the first hours of the onset of symptoms does not rule out myocardial infarction with certainty. If myocardial infarction is still suspected, repeat the test at appropriate intervals.   Protime-INR     Status: Abnormal   Collection Time: 09/24/17  9:10 PM  Result Value Ref Range   Prothrombin Time 30.3 (H) 11.4 - 15.2 seconds   INR 2.92     Comment: Performed at [facility] [address] [place], [place] [address]  Type and screen [place]     Status: None (Preliminary result)   Collection Time: 09/24/17  9:10 PM  Result Value Ref Range   ABO/RH(D) O POS    Antibody Screen NEG    Sample Expiration 09/27/2017    Unit Number [id number]    Blood Component Type RED CELLS,LR    Unit division 00    Status of Unit ISSUED    Transfusion Status OK TO TRANSFUSE    Crossmatch Result Compatible    Unit Number [id number]    Blood Component Type RED CELLS,LR    Unit division 00    Status of Unit ISSUED    Transfusion Status OK TO TRANSFUSE    Crossmatch Result      Compatible Performed at [facility], [place] [address], [place], [place] [address]  Unit Number [id number]    Blood  Component Type RED CELLS,LR    Unit division 00    Status of Unit ALLOCATED    Transfusion Status OK TO TRANSFUSE    Crossmatch Result Compatible    Unit Number [id number]    Blood Component Type RBC LR PHER1    Unit division 00    Status of Unit ALLOCATED    Transfusion Status OK TO TRANSFUSE    Crossmatch Result Compatible   ABO/Rh     Status: None (Preliminary result)   Collection Time: 09/24/17  9:10 PM  Result Value Ref Range   ABO/RH(D)      O POS Performed at [facility], [place] [address], [place], [name] [address]   POC occult blood, ED Provider will collect     Status: Abnormal   Collection Time: 09/24/17  9:33 PM  Result Value Ref Range   Fecal Occult Bld POSITIVE (A) NEGATIVE  Prepare RBC     Status: None   Collection Time: 09/24/17  9:59 PM  Result Value Ref Range   Order Confirmation      ORDER PROCESSED BY BLOOD BANK Performed at [facility], [place] [address] [place], [place] [address]   CBC     Status: Abnormal   Collection Time: 09/24/17 10:19 PM  Result Value Ref Range   WBC 28.4 (H) 4.0 - 10.5 K/uL   RBC 1.96 (L) 3.87 - 5.11 MIL/uL   Hemoglobin 4.4 (LL) 12.0 - 15.0 g/dL    Comment: REPEATED TO VERIFY SPECIMEN CHECKED FOR CLOTS CRITICAL VALUE NOTED.  VALUE IS CONSISTENT WITH PREVIOUSLY REPORTED AND CALLED VALUE.    HCT 14.7 (L) 36.0 - 46.0 %   MCV 75.0 (L) 78.0 - 100.0 fL   MCH 22.4 (L) 26.0 - 34.0 pg   MCHC 29.9 (L) 30.0 - 36.0 g/dL   RDW 21.1 (H) 11.5 - 15.5 %   Platelets 304 150 - 400 K/uL    Comment: Performed at [facility] [address], [place], [place] [address]  Brain natriuretic peptide     Status: None   Collection Time: 09/24/17 10:23 PM  Result Value Ref Range   B Natriuretic Peptide 16.7 0.0 - 100.0 pg/mL    Comment: Performed at [facility] [address], [place], [place] [address]  Prepare fresh frozen plasma     Status: None (Preliminary result)   Collection Time: 09/24/17 10:27 PM  Result Value  Ref Range   Unit Number [id number]    Blood Component Type THAWED PLASMA    Unit division 00    Status of Unit REL FROM [facility]    Transfusion Status OK TO TRANSFUSE    Unit Number [id number]    Blood Component Type THAWED PLASMA    Unit division 00    Status of Unit REL FROM [facility]    Transfusion Status OK TO TRANSFUSE    Unit Number [id number]    Blood Component Type THAWED PLASMA    Unit division 00    Status of Unit ALLOCATED    Transfusion Status      OK TO TRANSFUSE Performed at [facility], [place] [address] [place], [place] [address]    Unit Number [id number]    Blood Component Type THAWED PLASMA    Unit division 00    Status of Unit ALLOCATED    Transfusion Status OK TO TRANSFUSE   Urinalysis, Routine w reflex microscopic  Status: Abnormal   Collection Time: 09/25/17  6:40 AM  Result Value Ref Range   Color, Urine STRAW (A) YELLOW   APPearance CLEAR CLEAR   Specific Gravity, Urine 1.009 1.005 - 1.030   pH 5.0 5.0 - 8.0   Glucose, UA NEGATIVE NEGATIVE mg/dL   Hgb urine dipstick MODERATE (A) NEGATIVE   Bilirubin Urine NEGATIVE NEGATIVE   Ketones, ur NEGATIVE NEGATIVE mg/dL   Protein, ur NEGATIVE NEGATIVE mg/dL   Nitrite NEGATIVE NEGATIVE   Leukocytes, UA NEGATIVE NEGATIVE   RBC / HPF 0-5 0 - 5 RBC/hpf   WBC, UA 0-5 0 - 5 WBC/hpf   Bacteria, UA NONE SEEN NONE SEEN   Squamous Epithelial / LPF 0-5 (A) NONE SEEN   Hyaline Casts, UA PRESENT     Comment: Performed at [facility], [address] [place], [place] [address]  CBC     Status: Abnormal   Collection Time: 09/25/17 10:07 AM  Result Value Ref Range   WBC 25.7 (H) 4.0 - 10.5 K/uL   RBC 2.89 (L) 3.87 - 5.11 MIL/uL   Hemoglobin 7.6 (L) 12.0 - 15.0 g/dL    Comment: POST TRANSFUSION SPECIMEN   HCT 23.4 (L) 36.0 - 46.0 %   MCV 81.0 78.0 - 100.0 fL    Comment: POST TRANSFUSION SPECIMEN   MCH 26.3 26.0 - 34.0 pg   MCHC 32.5 30.0 - 36.0 g/dL   RDW 19.6 (H) 11.5 - 15.5 %   Platelets 263  150 - 400 K/uL    Comment: Performed at [facility], [place] [address], [place], [place] [address]  Protime-INR     Status: Abnormal   Collection Time: 09/25/17 10:07 AM  Result Value Ref Range   Prothrombin Time 17.0 (H) 11.4 - 15.2 seconds   INR 1.40     Comment: Performed at [facility] [address] [place], [place] [address]  Glucose, capillary     Status: Abnormal   Collection Time: 09/25/17 11:08 AM  Result Value Ref Range   Glucose-Capillary 108 (H) 65 - 99 mg/dL    Ct Abdomen Pelvis Wo Contrast  Result Date: 09/25/2017 CLINICAL DATA:  Acute onset of central chest pain, nausea, vomiting and diarrhea. EXAM: CT ABDOMEN AND PELVIS WITHOUT CONTRAST TECHNIQUE: Multidetector CT imaging of the abdomen and pelvis was performed following the standard protocol without IV contrast. COMPARISON:  Abdominal ultrasound performed 05/29/2007 FINDINGS: Lower chest: The visualized lung bases are grossly clear. Mild coronary artery calcification is noted. Hepatobiliary: The liver is unremarkable in appearance. The patient is status post cholecystectomy, with clips noted at the gallbladder fossa. The common bile duct remains normal in caliber. Pancreas: The pancreas is within normal limits. Spleen: The spleen is unremarkable in appearance. Adrenals/Urinary Tract: The adrenal glands are unremarkable in appearance. The kidneys are within normal limits. There is no evidence of hydronephrosis. No renal or ureteral stones are identified. No perinephric stranding is seen. Stomach/Bowel: The stomach is unremarkable in appearance. The small bowel is within normal limits. The appendix is normal in caliber, without evidence of appendicitis. The colon is unremarkable in appearance. Vascular/Lymphatic: The abdominal aorta is unremarkable in appearance. The inferior vena cava is grossly unremarkable. No retroperitoneal lymphadenopathy is seen. No pelvic sidewall lymphadenopathy is identified. Reproductive: The  bladder is mildly distended and grossly unremarkable. The uterus is unremarkable in appearance. The ovaries are relatively symmetric. No suspicious adnexal masses are seen. Other: A small umbilical hernia is noted, containing only  fat. Musculoskeletal: No acute osseous abnormalities are identified. Multilevel vacuum phenomenon is noted along the lower thoracic and lumbar spine. The visualized musculature is unremarkable in appearance. IMPRESSION: 1. No acute abnormality seen within the abdomen or pelvis. 2. Small umbilical hernia, containing only fat. 3. Mild coronary artery calcification. Electronically Signed   By: Garald Balding M.D.   On: 09/25/2017 03:09   Dg Chest 2 View  Result Date: 09/24/2017 CLINICAL DATA:  Central chest pain starting 2 days ago, nausea, vomiting and diarrhea. Shortness of breath, malaise, fatigue and altered mental status. EXAM: CHEST - 2 VIEW COMPARISON:  Chest x-ray dated 04/11/2017 FINDINGS: Hypoinspiratory exam. Heart size and mediastinal contours are stable. Given the low lung volumes, lungs appear clear. No pleural effusion or pneumothorax seen. No acute or suspicious osseous finding. IMPRESSION: Low lung volumes. No active cardiopulmonary disease. No evidence of pneumonia or pulmonary edema. Electronically Signed   By: Franki Cabot M.D.   On: 09/24/2017 19:57   Ct Head Wo Contrast  Result Date: 09/25/2017 CLINICAL DATA:  Chest pain nausea vomiting fatigue and altered mental status EXAM: CT HEAD WITHOUT CONTRAST TECHNIQUE: Contiguous axial images were obtained from the base of the skull through the vertex without intravenous contrast. COMPARISON:  MRI 04/21/2017, head CT 04/11/2017 FINDINGS: Brain: No acute territorial infarction, hemorrhage, or intracranial mass. Mild small vessel ischemic changes of the white matter. 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 Stable ventricle size Vascular: No hyperdense vessels.  Scattered carotid calcification Skull: Normal. Negative  for fracture or focal lesion. Sinuses/Orbits: No acute finding. Other: None IMPRESSION: 1. No CT evidence for acute intracranial abnormality. 2. Mild small vessel ischemic changes of the white matter. Electronically Signed   By: Donavan Foil M.D.   On: 09/25/2017 01:56    ROS negative except above Blood pressure (!) 135/49, pulse (!) 101, temperature 98 F (36.7 C), temperature source Oral, resp. rate 19, height 5' 2"  (1.575 m), weight 126.7 kg (279 lb 5.2 oz), SpO2 100 %. Physical Exam vital signs stable currently lying comfortably in the bed will answer occasional questions lungs are clear heart regular rate and rhythm abdomen is soft nontender occasional bowel sounds labs CT chest x-ray reviewed INR improved hemoglobin improved both with transfusions increased white count questionable etiology increased BUN and creatinine above baseline  Assessment/Plan: GI bleeding in a patient on aspirin and Coumadin Plan: We'll proceed with endoscopy this morning with anesthesia assistance and the procedure including the risks was discussed with the daughter with further workup and plans pending those findings  Michele [name] 09/25/2017, 11:37 AM

## 2017-09-25 NOTE — Transfer of Care (Signed)
Immediate Anesthesia Transfer of Care Note  Patient: Michele [name]  Procedure(s) Performed: ESOPHAGOGASTRODUODENOSCOPY (EGD) (N/A )  Patient Location: PACU  Anesthesia Type:General  Level of Consciousness: sedated, lethargic and responds to stimulation  Airway & Oxygen Therapy: Patient Spontanous Breathing and Patient connected to nasal cannula oxygen  Post-op Assessment: Report given to RN and Patient moving all extremities  Post vital signs: Reviewed and stable  Last Vitals:  Vitals:   09/25/17 1100 09/25/17 1215  BP: (!) 135/49 (P) 132/82  Pulse: (!) 101 (!) (P) 112  Resp: 19   Temp: 36.7 C (P) 36.6 C  SpO2: 100% (P) 95%    Last Pain:  Vitals:   09/25/17 1100  TempSrc: Oral  PainSc:          Complications: No apparent anesthesia complications

## 2017-09-25 NOTE — ED Notes (Signed)
Patient transported to CT 

## 2017-09-25 NOTE — Progress Notes (Signed)
  Echocardiogram 2D Echocardiogram has been performed.  Michele [name] G Michele [name] 09/25/2017, 9:31 AM

## 2017-09-25 NOTE — Plan of Care (Signed)
  Clinical Measurements: Cardiovascular complication will be avoided 09/25/2017 2028 - Progressing by Irish Lack, RN   Clinical Measurements: Ability to maintain clinical measurements within normal limits will improve 09/25/2017 2028 - Progressing by Irish Lack, RN

## 2017-09-25 NOTE — Progress Notes (Signed)
Patient admitted to 6E03 from ED via stretcher.  Bed in low position, wheels locked.  Patient lethargic, but able to state her name and DOB. Telemetry monitor applied.  Daughter at bedside.  Phlebotomy drawing CBC, INR, BMP, Lactic Acid, HgA1C, and blood cultures.

## 2017-09-25 NOTE — Progress Notes (Addendum)
PROGRESS NOTE    Michele [name]   XQJ:[id number]  DOB: July 19, 1951  DOA: 09/24/2017 PCP: [name], MD   Brief Narrative:  Michele [name] with a 67 y/o female with DM2, A-fib on Coumadin, CAD s/p stent, asthma, HTN, OSA, PTSD who presents for chest pain, nausea, vomiting, bloody diarrhea and confusion.  ER findings: Hb 4.3, INR 2.92, temp 100.1, tachycardia, WBC 23.9, sodium 131, Cr 1.14, Glucose 269, FOB +.    Subjective: Quite sleepy and not talking enough. She has no complaints.    Assessment & Plan:   Principal Problem:   GIB (gastrointestinal bleeding) Acute blood loss anemia   - CT abd/pelvis w/o contrast is normal - 3 u PRBC ordered to transfuse - INR reversed with Vit K-  - Cipro and Flagyl started in ER- called by RN later that patient and daughter state that she has not had any diarrhea- will d/c C diff PCR - s/p EGD - gastritis and blood noted in stomach and duodenum but no active bleed  - GI recommends daily Protonix and clear liquids - cont to follow H/H Q 12  Active Problems:   Essential hypertension - on HCTZ, Cardizem, Imdur, Loperssor at home - cont Lopressor only for now    Paroxysmal A-fib  - HR rapid possibly due to volume loss- replacing blood - hold Coumadin - cont Lopressor   - can resume Cardizem in AM if BP stable - ECHO noted below-   Acute metabolic encephalopathy - due to acute illness- cont to follow    Major depressive disorder, recurrent episode, moderate  - cont Zoloft, Cogentin and Abilify  DM2 - hold Metformin- cont SSI   DVT prophylaxis: SCDs Code Status: Full code Family Communication:  Disposition Plan: cont to follow Consultants:   GI Procedures:  EGD: Normal esophagus. - Gastritis. - Duodenal diverticulum. - Clotted blood in the greater curvature of the stomach. - The examination was otherwise normal. - Blood in the third portion of the duodenum. - No specimens collected. no signs of active  bleeding  2 D ECHO Impressions:  - Normal LV size with mild LV hypertrophy. EF 65-70%, vigorous   systolic function. Normal RV size and systolic function. No   significant valvular abnormalities. Antimicrobials:  Anti-infectives (From admission, onward)   Start     Dose/Rate Route Frequency Ordered Stop   09/24/17 2315  metroNIDAZOLE (FLAGYL) IVPB 500 mg     500 mg 100 mL/hr over 60 Minutes Intravenous Every 8 hours 09/24/17 2254     09/24/17 2315  ciprofloxacin (CIPRO) IVPB 400 mg     400 mg 200 mL/hr over 60 Minutes Intravenous Every 12 hours 09/24/17 2313         Objective: Vitals:   09/25/17 0336 09/25/17 0356 09/25/17 0432 09/25/17 0500  BP:  94/75 122/78 110/61  Pulse: (!) 115 (!) 106    Resp: 18 16 19    Temp: 98.1 F (36.7 C) 98.2 F (36.8 C)    TempSrc:  Oral    SpO2: 100% 100%    Weight:      Height:        Intake/Output Summary (Last 24 hours) at 09/25/2017 0751 Last data filed at 09/25/2017 4481 Gross per 24 hour  Intake 2438 ml  Output -  Net 2438 ml   Filed Weights   09/24/17 2302  Weight: 124.7 kg (275 lb)    Examination: General exam: Appears comfortable  HEENT: PERRLA, oral mucosa moist, no sclera icterus or  thrush Respiratory system: Clear to auscultation. Respiratory effort normal. Cardiovascular system: S1 & S2 heard, RRR.  No murmurs  Gastrointestinal system: Abdomen soft, non-tender, nondistended. Normal bowel sound. No organomegaly Central nervous system: Alert No focal neurological deficits. Extremities: No cyanosis, clubbing or edema Skin: No rashes or ulcers   Data Reviewed: I have personally reviewed following labs and imaging studies  CBC: Recent Labs  Lab 09/24/17 2009 09/24/17 2219  WBC 23.9* 28.4*  HGB 4.3* 4.4*  HCT 14.6* 14.7*  MCV 73.7* 75.0*  PLT 352 027   Basic Metabolic Panel: Recent Labs  Lab 09/24/17 2009  NA 131*  K 4.1  CL 95*  CO2 18*  GLUCOSE 269*  BUN 41*  CREATININE 1.14*  CALCIUM 8.7*    GFR: Estimated Creatinine Clearance: 61.2 mL/min (A) (by C-G formula based on SCr of 1.14 mg/dL (H)). Liver Function Tests: Recent Labs  Lab 09/24/17 2009  AST 28  ALT 17  ALKPHOS 61  BILITOT 0.5  PROT 6.2*  ALBUMIN 3.4*   Recent Labs  Lab 09/24/17 2009  LIPASE 26   No results for input(s): AMMONIA in the last 168 hours. Coagulation Profile: Recent Labs  Lab 09/24/17 2110  INR 2.92   Cardiac Enzymes: No results for input(s): CKTOTAL, CKMB, CKMBINDEX, TROPONINI in the last 168 hours. BNP (last 3 results) No results for input(s): PROBNP in the last 8760 hours. HbA1C: No results for input(s): HGBA1C in the last 72 hours. CBG: No results for input(s): GLUCAP in the last 168 hours. Lipid Profile: No results for input(s): CHOL, HDL, LDLCALC, TRIG, CHOLHDL, LDLDIRECT in the last 72 hours. Thyroid Function Tests: No results for input(s): TSH, T4TOTAL, FREET4, T3FREE, THYROIDAB in the last 72 hours. Anemia Panel: No results for input(s): VITAMINB12, FOLATE, FERRITIN, TIBC, IRON, RETICCTPCT in the last 72 hours. Urine analysis:    Component Value Date/Time   COLORURINE STRAW (A) 09/25/2017 0640   APPEARANCEUR CLEAR 09/25/2017 0640   LABSPEC 1.009 09/25/2017 0640   LABSPEC 1.025 06/23/2007 0832   PHURINE 5.0 09/25/2017 0640   GLUCOSEU NEGATIVE 09/25/2017 0640   HGBUR MODERATE (A) 09/25/2017 0640   BILIRUBINUR NEGATIVE 09/25/2017 0640   BILIRUBINUR negative 04/28/2015 1157   BILIRUBINUR neg 12/08/2014 1227   BILIRUBINUR Negative 06/23/2007 0832   KETONESUR NEGATIVE 09/25/2017 0640   PROTEINUR NEGATIVE 09/25/2017 0640   UROBILINOGEN 0.2 04/28/2015 1157   UROBILINOGEN 0.2 01/30/2009 0628   NITRITE NEGATIVE 09/25/2017 0640   LEUKOCYTESUR NEGATIVE 09/25/2017 0640   LEUKOCYTESUR Negative 06/23/2007 0832   Sepsis Labs: @LABRCNTIP (procalcitonin:4,lacticidven:4) )No results found for this or any previous visit (from the past 240 hour(s)).       Radiology  Studies: Ct Abdomen Pelvis Wo Contrast  Result Date: 09/25/2017 CLINICAL DATA:  Acute onset of central chest pain, nausea, vomiting and diarrhea. EXAM: CT ABDOMEN AND PELVIS WITHOUT CONTRAST TECHNIQUE: Multidetector CT imaging of the abdomen and pelvis was performed following the standard protocol without IV contrast. COMPARISON:  Abdominal ultrasound performed 05/29/2007 FINDINGS: Lower chest: The visualized lung bases are grossly clear. Mild coronary artery calcification is noted. Hepatobiliary: The liver is unremarkable in appearance. The patient is status post cholecystectomy, with clips noted at the gallbladder fossa. The common bile duct remains normal in caliber. Pancreas: The pancreas is within normal limits. Spleen: The spleen is unremarkable in appearance. Adrenals/Urinary Tract: The adrenal glands are unremarkable in appearance. The kidneys are within normal limits. There is no evidence of hydronephrosis. No renal or ureteral stones are identified. No perinephric  stranding is seen. Stomach/Bowel: The stomach is unremarkable in appearance. The small bowel is within normal limits. The appendix is normal in caliber, without evidence of appendicitis. The colon is unremarkable in appearance. Vascular/Lymphatic: The abdominal aorta is unremarkable in appearance. The inferior vena cava is grossly unremarkable. No retroperitoneal lymphadenopathy is seen. No pelvic sidewall lymphadenopathy is identified. Reproductive: The bladder is mildly distended and grossly unremarkable. The uterus is unremarkable in appearance. The ovaries are relatively symmetric. No suspicious adnexal masses are seen. Other: A small umbilical hernia is noted, containing only fat. Musculoskeletal: No acute osseous abnormalities are identified. Multilevel vacuum phenomenon is noted along the lower thoracic and lumbar spine. The visualized musculature is unremarkable in appearance. IMPRESSION: 1. No acute abnormality seen within the abdomen  or pelvis. 2. Small umbilical hernia, containing only fat. 3. Mild coronary artery calcification. Electronically Signed   By: Garald Balding M.D.   On: 09/25/2017 03:09   Dg Chest 2 View  Result Date: 09/24/2017 CLINICAL DATA:  Central chest pain starting 2 days ago, nausea, vomiting and diarrhea. Shortness of breath, malaise, fatigue and altered mental status. EXAM: CHEST - 2 VIEW COMPARISON:  Chest x-ray dated 04/11/2017 FINDINGS: Hypoinspiratory exam. Heart size and mediastinal contours are stable. Given the low lung volumes, lungs appear clear. No pleural effusion or pneumothorax seen. No acute or suspicious osseous finding. IMPRESSION: Low lung volumes. No active cardiopulmonary disease. No evidence of pneumonia or pulmonary edema. Electronically Signed   By: Franki Cabot M.D.   On: 09/24/2017 19:57   Ct Head Wo Contrast  Result Date: 09/25/2017 CLINICAL DATA:  Chest pain nausea vomiting fatigue and altered mental status EXAM: CT HEAD WITHOUT CONTRAST TECHNIQUE: Contiguous axial images were obtained from the base of the skull through the vertex without intravenous contrast. COMPARISON:  MRI 04/21/2017, head CT 04/11/2017 FINDINGS: Brain: No acute territorial infarction, hemorrhage, or intracranial mass. Mild small vessel ischemic changes of the white matter. 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 Stable ventricle size Vascular: No hyperdense vessels.  Scattered carotid calcification Skull: Normal. Negative for fracture or focal lesion. Sinuses/Orbits: No acute finding. Other: None IMPRESSION: 1. No CT evidence for acute intracranial abnormality. 2. Mild small vessel ischemic changes of the white matter. Electronically Signed   By: Donavan Foil M.D.   On: 09/25/2017 01:56      Scheduled Meds: . insulin aspart  0-5 Units Subcutaneous QHS  . insulin aspart  0-9 Units Subcutaneous TID WC  . levalbuterol  1.25 mg Nebulization Q6H  . [START ON 09/28/2017] pantoprazole  40 mg Intravenous Q12H    Continuous Infusions: . sodium chloride    . sodium chloride    . sodium chloride    . ciprofloxacin 400 mg (09/25/17 6387)  . metronidazole    . pantoprozole (PROTONIX) infusion 8 mg/hr (09/25/17 0635)     LOS: 1 day    Time spent in minutes: 35    Debbe Odea, MD [facility] Pager: [email or web address] Password [facility] 09/25/2017, 7:51 AM

## 2017-09-26 ENCOUNTER — Telehealth: Payer: Self-pay | Admitting: Cardiovascular Disease

## 2017-09-26 ENCOUNTER — Inpatient Hospital Stay (HOSPITAL_COMMUNITY): Payer: Medicare Other

## 2017-09-26 DIAGNOSIS — D508 Other iron deficiency anemias: Secondary | ICD-10-CM

## 2017-09-26 LAB — CBC
HEMATOCRIT: 24.8 % — AB (ref 36.0–46.0)
HEMATOCRIT: 24.3 % — AB (ref 36.0–46.0)
Hemoglobin: 8 g/dL — ABNORMAL LOW (ref 12.0–15.0)
Hemoglobin: 7.4 g/dL — ABNORMAL LOW (ref 12.0–15.0)
MCH: 26.8 pg (ref 26.0–34.0)
MCH: 25.7 pg — AB (ref 26.0–34.0)
MCHC: 32.3 g/dL (ref 30.0–36.0)
MCHC: 30.5 g/dL (ref 30.0–36.0)
MCV: 83.2 fL (ref 78.0–100.0)
MCV: 84.4 fL (ref 78.0–100.0)
Platelets: 237 10*3/uL (ref 150–400)
Platelets: 225 10*3/uL (ref 150–400)
RBC: 2.98 MIL/uL — ABNORMAL LOW (ref 3.87–5.11)
RBC: 2.88 MIL/uL — ABNORMAL LOW (ref 3.87–5.11)
RDW: 19.8 % — ABNORMAL HIGH (ref 11.5–15.5)
RDW: 19.9 % — AB (ref 11.5–15.5)
WBC: 14.8 10*3/uL — ABNORMAL HIGH (ref 4.0–10.5)
WBC: 12 10*3/uL — ABNORMAL HIGH (ref 4.0–10.5)

## 2017-09-26 LAB — BLOOD CULTURE ID PANEL (REFLEXED)
ACINETOBACTER BAUMANNII: NOT DETECTED
Candida albicans: NOT DETECTED
Candida glabrata: NOT DETECTED
Candida krusei: NOT DETECTED
Candida parapsilosis: NOT DETECTED
Candida tropicalis: NOT DETECTED
ENTEROCOCCUS SPECIES: NOT DETECTED
Enterobacter cloacae complex: NOT DETECTED
Enterobacteriaceae species: NOT DETECTED
Escherichia coli: NOT DETECTED
Haemophilus influenzae: NOT DETECTED
Klebsiella oxytoca: NOT DETECTED
Klebsiella pneumoniae: NOT DETECTED
LISTERIA MONOCYTOGENES: NOT DETECTED
METHICILLIN RESISTANCE: NOT DETECTED
NEISSERIA MENINGITIDIS: NOT DETECTED
PROTEUS SPECIES: NOT DETECTED
PSEUDOMONAS AERUGINOSA: NOT DETECTED
SERRATIA MARCESCENS: NOT DETECTED
STAPHYLOCOCCUS SPECIES: DETECTED — AB
STREPTOCOCCUS AGALACTIAE: NOT DETECTED
STREPTOCOCCUS SPECIES: NOT DETECTED
Staphylococcus aureus (BCID): NOT DETECTED
Streptococcus pneumoniae: NOT DETECTED
Streptococcus pyogenes: NOT DETECTED

## 2017-09-26 LAB — BASIC METABOLIC PANEL
Anion gap: 10 (ref 5–15)
BUN: 28 mg/dL — AB (ref 6–20)
CO2: 29 mmol/L (ref 22–32)
CREATININE: 0.92 mg/dL (ref 0.44–1.00)
Calcium: 8.3 mg/dL — ABNORMAL LOW (ref 8.9–10.3)
Chloride: 96 mmol/L — ABNORMAL LOW (ref 101–111)
Glucose, Bld: 135 mg/dL — ABNORMAL HIGH (ref 65–99)
Potassium: 3.4 mmol/L — ABNORMAL LOW (ref 3.5–5.1)
SODIUM: 135 mmol/L (ref 135–145)

## 2017-09-26 LAB — PROTIME-INR
INR: 1.17
PROTHROMBIN TIME: 14.9 s (ref 11.4–15.2)

## 2017-09-26 LAB — GLUCOSE, CAPILLARY
Glucose-Capillary: 113 mg/dL — ABNORMAL HIGH (ref 65–99)
Glucose-Capillary: 120 mg/dL — ABNORMAL HIGH (ref 65–99)
Glucose-Capillary: 100 mg/dL — ABNORMAL HIGH (ref 65–99)
Glucose-Capillary: 89 mg/dL (ref 65–99)
Glucose-Capillary: 111 mg/dL — ABNORMAL HIGH (ref 65–99)
Glucose-Capillary: 90 mg/dL (ref 65–99)

## 2017-09-26 LAB — LACTIC ACID, PLASMA: Lactic Acid, Venous: 1.9 mmol/L (ref 0.5–1.9)

## 2017-09-26 LAB — TROPONIN I: Troponin I: <0.03 ng/mL (ref <0.03)

## 2017-09-26 MED ORDER — SODIUM CHLORIDE 0.9% FLUSH: 10.0000 mL | INTRAVENOUS | Status: DC | PRN

## 2017-09-26 MED ORDER — LIDOCAINE HCL 1 % IJ SOLN
INTRAMUSCULAR | Status: AC
  Filled 2017-09-26: qty 20

## 2017-09-26 MED ORDER — POTASSIUM CHLORIDE CRYS ER 20 MEQ PO TBCR
40.0000 meq | EXTENDED_RELEASE_TABLET | ORAL | Status: AC
  Administered 2017-09-26 (×2): 40 meq via ORAL
  Filled 2017-09-26: qty 2

## 2017-09-26 MED ORDER — METOPROLOL TARTRATE 5 MG/5ML IV SOLN: 5.0000 mg | INTRAVENOUS | Status: DC | PRN

## 2017-09-26 MED ORDER — LIDOCAINE HCL (PF) 1 % IJ SOLN
INTRAMUSCULAR | Status: AC | PRN
  Administered 2017-09-26: 5 mL

## 2017-09-26 MED ORDER — SODIUM CHLORIDE 0.9% FLUSH
10.0000 mL | Freq: Two times a day (BID) | INTRAVENOUS | Status: DC
  Administered 2017-09-26: 20 mL
  Administered 2017-09-27 – 2017-09-28 (×4): 10 mL

## 2017-09-26 MED ORDER — GI COCKTAIL ~~LOC~~
30.0000 mL | Freq: Three times a day (TID) | ORAL | Status: DC | PRN
  Administered 2017-09-26: 30 mL via ORAL
  Filled 2017-09-26: qty 30

## 2017-09-26 NOTE — Progress Notes (Signed)
Spoke with Respiratory Therapist about applying CPAP when patient is asleep, not just at night, as patient is more lethargic than this morning.  Respiratory will come to room to apply CPAP. Updated Dr. Wynelle Cleveland.

## 2017-09-26 NOTE — Progress Notes (Signed)
Assisted patient OOB to chair at 1430.  Able to walk a few steps and pivot with minimal assistance.

## 2017-09-26 NOTE — Care Management Note (Addendum)
Case Management Note  Patient Details  Name: Michele [name] MRN: [id number] Date of Birth: May 11, 1951  Subjective/Objective: Pt presented for Chest Pian, N/V and Bloody Diarrhea. PTA from home with support of daughter and her children. Pt's daughter works and she has personal care services MWF no set times. Pt has [employer] for DME at home. CM did ask for PT/OT consult for recommendations. Pt was at [facility] in Sept.                    Action/Plan: CM will continue to monitor for additional needs.   Expected Discharge Date:                  Expected Discharge Plan:  [place]  In-House Referral:  Clinical Social Work  Discharge planning Services  CM Consult  Post Acute Care Choice:   Home Health Choice offered to:   Patient, Adult Children  DME Arranged:   N/A DME Agency:   N/A  HH Arranged:   RN,PT [place] Agency:    [facility] (Former[facility] [employer])  Status of Service:  Completed, signed off  If discussed at [employer], dates discussed:    Additional Comments: [id number] 09-27-17 Jacqlyn Krauss, RN,BSN [phone] CM did speak with patient and daughter- both agreeable to [place]. Agency List provided to family and both chose [facility]. CM did make referral to [facility]- able to take the patient and SOC to begin within 24-48 hours post d/c. Daughter wants RN to do lab draws for INR. PT for evaluate and treat. CM will continue to monitor.    Bethena Roys, RN 09/26/2017, 10:48 AM

## 2017-09-26 NOTE — Progress Notes (Signed)
PHARMACY - PHYSICIAN COMMUNICATION CRITICAL VALUE ALERT - BLOOD CULTURE IDENTIFICATION (BCID)  Michele [name] is an 67 y.o. female who presented to [facility] on [facility]/24/2017 with a chief complaint of CP, N/V, abdominal pain, blood diarrhea, and AMS  Assessment: [name] being evaluated and worked up for GIB - currently not on antibiotics and now with 1 of 2 blood cultures showing Coag negative Staph likely representative of a contaminant.   Name of physician (or Provider) Contacted: Rizwan  Current antibiotics: None  Changes to prescribed antibiotics recommended:  Patient is on recommended antibiotics - No changes needed  Results for orders placed or performed during the hospital encounter of 09/24/17  Blood Culture ID Panel (Reflexed) (Collected: 09/25/2017 10:07 AM)  Result Value Ref Range   Enterococcus species NOT DETECTED NOT DETECTED   Listeria monocytogenes NOT DETECTED NOT DETECTED   Staphylococcus species DETECTED (A) NOT DETECTED   Staphylococcus aureus NOT DETECTED NOT DETECTED   Methicillin resistance NOT DETECTED NOT DETECTED   Streptococcus species NOT DETECTED NOT DETECTED   Streptococcus agalactiae NOT DETECTED NOT DETECTED   Streptococcus pneumoniae NOT DETECTED NOT DETECTED   Streptococcus pyogenes NOT DETECTED NOT DETECTED   Acinetobacter baumannii NOT DETECTED NOT DETECTED   Enterobacteriaceae species NOT DETECTED NOT DETECTED   Enterobacter cloacae complex NOT DETECTED NOT DETECTED   Escherichia coli NOT DETECTED NOT DETECTED   Klebsiella oxytoca NOT DETECTED NOT DETECTED   Klebsiella pneumoniae NOT DETECTED NOT DETECTED   Proteus species NOT DETECTED NOT DETECTED   Serratia marcescens NOT DETECTED NOT DETECTED   Haemophilus influenzae NOT DETECTED NOT DETECTED   Neisseria meningitidis NOT DETECTED NOT DETECTED   Pseudomonas aeruginosa NOT DETECTED NOT DETECTED   Candida albicans NOT DETECTED NOT DETECTED   Candida glabrata NOT DETECTED NOT DETECTED   Candida krusei NOT DETECTED NOT DETECTED   Candida parapsilosis NOT DETECTED NOT DETECTED   Candida tropicalis NOT DETECTED NOT DETECTED    Lawson Radar 09/26/2017  1:05 PM

## 2017-09-26 NOTE — Progress Notes (Signed)
PROGRESS NOTE    Michele [name]   HYI:[id number]  DOB: 08-11-1950  DOA: 09/24/2017 PCP: [name], MD   Brief Narrative:  Michele [name] with a 67 y/o female with DM2, A-fib on Coumadin, CAD s/p stent, asthma, HTN, OSA, PTSD who presents for chest pain, nausea, vomiting, bloody diarrhea and confusion and lethargy ER findings: Hb 4.3, INR 2.92, temp 100.1, tachycardia, WBC 23.9, sodium 131, Cr 1.14, Glucose 269, FOB +.    Subjective:  Sleepy this AM. She was up in the middle of the night with a complaint of chest pain and receive Morphine.    Assessment & Plan:   Principal Problem:   GIB (gastrointestinal bleeding) Acute blood loss anemia   - CT abd/pelvis w/o contrast shows only a small umbilical hernia - 3 u PRBC transfused- Hb 8.0 now- she was 8.9 in 1/9 and is therefore close to her baseline - INR reversed with Vit K- now 1.17 - Cipro and Flagyl started in ER- called by RN later that patient and daughter state that she has not had any diarrhea-  d/c C diff PCR - s/p EGD - gastritis and blood noted in stomach and duodenum but no active bleeding and therefore source undetermined  - GI recommends daily Protonix   - cont to follow H/H Q 12 & cont to follow for further bleeding - anemia panel in 07/27/17 revealed elevated Ferritin, low Iron saturation and normal Iron binding, normal folate-  - full liquids today   Active Problems: Fever - very low grade at 100.1 while in ER- no obvious cause- has not recurred - UA negative- CXR negative- no respiratory symptoms - noted to have gr + cocci in 1 set of cultures, anaerobic bottle (? Contaminant)    Essential hypertension - on HCTZ, Cardizem, Imdur, Loperssor at home - cont Lopressor only for now    Paroxysmal A-fib  CHA2DS2-VASc Score at least 4 - Sinus tach possibly due to volume loss-  - holding Coumadin- GI to recommend when to resume anticoagulation - cont Lopressor   - HR much improved today - ECHO noted  below-   Acute metabolic encephalopathy - due to acute illness- per caretaker, she has been very somnolent since last Friday prior to the GI symptoms - received Morphine last night and was up in the middle of the night with chest pain per chart and may be sleepy because of this- cont to follow- avoid narcotics and other sedatives    Major depressive disorder, recurrent episode, moderate  - cont Zoloft, Cogentin and Abilify  DM2 - hold Metformin- cont SSI   DVT prophylaxis: SCDs Code Status: Full code Family Communication:  Disposition Plan: cont to follow Consultants:   GI Procedures:  EGD: Normal esophagus. - Gastritis. - Duodenal diverticulum. - Clotted blood in the greater curvature of the stomach. - The examination was otherwise normal. - Blood in the third portion of the duodenum. - No specimens collected. no signs of active bleeding  2 D ECHO Impressions:  - Normal LV size with mild LV hypertrophy. EF 65-70%, vigorous   systolic function. Normal RV size and systolic function. No   significant valvular abnormalities.  Antimicrobials:  Anti-infectives (From admission, onward)   Start     Dose/Rate Route Frequency Ordered Stop   09/24/17 2315  metroNIDAZOLE (FLAGYL) IVPB 500 mg  Status:  Discontinued     500 mg 100 mL/hr over 60 Minutes Intravenous Every 8 hours 09/24/17 2254 09/25/17 1245   09/24/17 2315  ciprofloxacin (CIPRO) IVPB 400 mg  Status:  Discontinued     400 mg 200 mL/hr over 60 Minutes Intravenous Every 12 hours 09/24/17 2313 09/25/17 1245       Objective: Vitals:   09/26/17 0503 09/26/17 0644 09/26/17 0737 09/26/17 0740  BP: (!) 95/48 95/80 118/71 118/71  Pulse: 88 86  82  Resp: 17 17  15   Temp: 98.9 F (37.2 C)  97.6 F (36.4 C)   TempSrc: Oral  Oral   SpO2: 96% 100%  100%  Weight: 128.8 kg (283 lb 15.2 oz)     Height:        Intake/Output Summary (Last 24 hours) at 09/26/2017 0842 Last data filed at 09/26/2017 0700 Gross per 24 hour   Intake 1682.09 ml  Output 650 ml  Net 1032.09 ml   Filed Weights   09/24/17 2302 09/25/17 0948 09/26/17 0503  Weight: 124.7 kg (275 lb) 126.7 kg (279 lb 5.2 oz) 128.8 kg (283 lb 15.2 oz)    Examination: General exam: Appears comfortable  Respiratory system: Clear to auscultation. Respiratory effort normal. Cardiovascular system: S1 & S2 heard,  No murmurs  Gastrointestinal system: Abdomen soft, non-tender, nondistended. Normal bowel sound. No organomegaly Central nervous system: sleepy- No focal neurological deficits. Extremities: No cyanosis, clubbing or edema Skin: No rashes or ulcers     Data Reviewed: I have personally reviewed following labs and imaging studies  CBC: Recent Labs  Lab 09/24/17 2009 09/24/17 2219 09/25/17 1007  WBC 23.9* 28.4* 25.7*  HGB 4.3* 4.4* 7.6*  HCT 14.6* 14.7* 23.4*  MCV 73.7* 75.0* 81.0  PLT 352 304 194   Basic Metabolic Panel: Recent Labs  Lab 09/24/17 2009 09/25/17 1007 09/26/17 0218  NA 131* 134* 135  K 4.1 3.6 3.4*  CL 95* 96* 96*  CO2 18* 22 29  GLUCOSE 269* 138* 135*  BUN 41* 34* 28*  CREATININE 1.14* 0.92 0.92  CALCIUM 8.7* 8.1* 8.3*   GFR: Estimated Creatinine Clearance: 77.5 mL/min (by C-G formula based on SCr of 0.92 mg/dL). Liver Function Tests: Recent Labs  Lab 09/24/17 2009  AST 28  ALT 17  ALKPHOS 61  BILITOT 0.5  PROT 6.2*  ALBUMIN 3.4*   Recent Labs  Lab 09/24/17 2009  LIPASE 26   No results for input(s): AMMONIA in the last 168 hours. Coagulation Profile: Recent Labs  Lab 09/24/17 2110 09/25/17 1007 09/26/17 0218  INR 2.92 1.40 1.17   Cardiac Enzymes: Recent Labs  Lab 09/25/17 1007 09/25/17 2146 09/26/17 0218  TROPONINI 0.03* 0.04* <0.03   BNP (last 3 results) No results for input(s): PROBNP in the last 8760 hours. HbA1C: Recent Labs    09/25/17 1007  HGBA1C 4.9   CBG: Recent Labs  Lab 09/25/17 1656 09/25/17 2007 09/25/17 2316 09/26/17 0423 09/26/17 0740  GLUCAP 110*  120* 115* 120* 100*   Lipid Profile: Recent Labs    09/25/17 1007  CHOL 103  HDL 31*  LDLCALC 40  TRIG 160*  CHOLHDL 3.3   Thyroid Function Tests: No results for input(s): TSH, T4TOTAL, FREET4, T3FREE, THYROIDAB in the last 72 hours. Anemia Panel: No results for input(s): VITAMINB12, FOLATE, FERRITIN, TIBC, IRON, RETICCTPCT in the last 72 hours. Urine analysis:    Component Value Date/Time   COLORURINE STRAW (A) 09/25/2017 0640   APPEARANCEUR CLEAR 09/25/2017 0640   LABSPEC 1.009 09/25/2017 0640   LABSPEC 1.025 06/23/2007 0832   PHURINE 5.0 09/25/2017 0640   GLUCOSEU NEGATIVE 09/25/2017 0640   HGBUR MODERATE (  A) 09/25/2017 0640   BILIRUBINUR NEGATIVE 09/25/2017 0640   BILIRUBINUR negative 04/28/2015 1157   BILIRUBINUR neg 12/08/2014 1227   BILIRUBINUR Negative 06/23/2007 [place] 09/25/2017 0640   PROTEINUR NEGATIVE 09/25/2017 0640   UROBILINOGEN 0.2 04/28/2015 1157   UROBILINOGEN 0.2 01/30/2009 0628   NITRITE NEGATIVE 09/25/2017 0640   LEUKOCYTESUR NEGATIVE 09/25/2017 0640   LEUKOCYTESUR Negative 06/23/2007 0832   Sepsis Labs: @LABRCNTIP (procalcitonin:4,lacticidven:4) ) Recent Results (from the past 240 hour(s))  MRSA PCR Screening     Status: None   Collection Time: 09/25/17  6:52 PM  Result Value Ref Range Status   MRSA by PCR NEGATIVE NEGATIVE Final    Comment:        The GeneXpert MRSA Assay (FDA approved for NASAL specimens only), is one component of a comprehensive MRSA colonization surveillance program. It is not intended to diagnose MRSA infection nor to guide or monitor treatment for MRSA infections. Performed at [facility], [place] [address], [place], [place] [address]          Radiology Studies: Ct Abdomen Pelvis Wo Contrast  Result Date: 09/25/2017 CLINICAL DATA:  Acute onset of central chest pain, nausea, vomiting and diarrhea. EXAM: CT ABDOMEN AND PELVIS WITHOUT CONTRAST TECHNIQUE: Multidetector CT imaging of the  abdomen and pelvis was performed following the standard protocol without IV contrast. COMPARISON:  Abdominal ultrasound performed 05/29/2007 FINDINGS: Lower chest: The visualized lung bases are grossly clear. Mild coronary artery calcification is noted. Hepatobiliary: The liver is unremarkable in appearance. The patient is status post cholecystectomy, with clips noted at the gallbladder fossa. The common bile duct remains normal in caliber. Pancreas: The pancreas is within normal limits. Spleen: The spleen is unremarkable in appearance. Adrenals/Urinary Tract: The adrenal glands are unremarkable in appearance. The kidneys are within normal limits. There is no evidence of hydronephrosis. No renal or ureteral stones are identified. No perinephric stranding is seen. Stomach/Bowel: The stomach is unremarkable in appearance. The small bowel is within normal limits. The appendix is normal in caliber, without evidence of appendicitis. The colon is unremarkable in appearance. Vascular/Lymphatic: The abdominal aorta is unremarkable in appearance. The inferior vena cava is grossly unremarkable. No retroperitoneal lymphadenopathy is seen. No pelvic sidewall lymphadenopathy is identified. Reproductive: The bladder is mildly distended and grossly unremarkable. The uterus is unremarkable in appearance. The ovaries are relatively symmetric. No suspicious adnexal masses are seen. Other: A small umbilical hernia is noted, containing only fat. Musculoskeletal: No acute osseous abnormalities are identified. Multilevel vacuum phenomenon is noted along the lower thoracic and lumbar spine. The visualized musculature is unremarkable in appearance. IMPRESSION: 1. No acute abnormality seen within the abdomen or pelvis. 2. Small umbilical hernia, containing only fat. 3. Mild coronary artery calcification. Electronically Signed   By: Garald Balding M.D.   On: 09/25/2017 03:09   Dg Chest 2 View  Result Date: 09/24/2017 CLINICAL DATA:   Central chest pain starting 2 days ago, nausea, vomiting and diarrhea. Shortness of breath, malaise, fatigue and altered mental status. EXAM: CHEST - 2 VIEW COMPARISON:  Chest x-ray dated 04/11/2017 FINDINGS: Hypoinspiratory exam. Heart size and mediastinal contours are stable. Given the low lung volumes, lungs appear clear. No pleural effusion or pneumothorax seen. No acute or suspicious osseous finding. IMPRESSION: Low lung volumes. No active cardiopulmonary disease. No evidence of pneumonia or pulmonary edema. Electronically Signed   By: Franki Cabot M.D.   On: 09/24/2017 19:57   Ct Head Wo Contrast  Result Date: 09/25/2017  CLINICAL DATA:  Chest pain nausea vomiting fatigue and altered mental status EXAM: CT HEAD WITHOUT CONTRAST TECHNIQUE: Contiguous axial images were obtained from the base of the skull through the vertex without intravenous contrast. COMPARISON:  MRI 04/21/2017, head CT 04/11/2017 FINDINGS: Brain: No acute territorial infarction, hemorrhage, or intracranial mass. Mild small vessel ischemic changes of the white matter. 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 Stable ventricle size Vascular: No hyperdense vessels.  Scattered carotid calcification Skull: Normal. Negative for fracture or focal lesion. Sinuses/Orbits: No acute finding. Other: None IMPRESSION: 1. No CT evidence for acute intracranial abnormality. 2. Mild small vessel ischemic changes of the white matter. Electronically Signed   By: Donavan Foil M.D.   On: 09/25/2017 01:56   [place] Ekg Site Rite  Result Date: 09/25/2017 If Site Rite image not attached, placement could not be confirmed due to current cardiac rhythm.     Scheduled Meds: . ARIPiprazole  5 mg Oral Daily  . benztropine  0.5 mg Oral QHS  . insulin aspart  0-5 Units Subcutaneous QHS  . insulin aspart  0-9 Units Subcutaneous Q4H  . metoprolol tartrate  25 mg Oral BID  . pantoprazole  40 mg Oral Daily  . sertraline  100 mg Oral Daily   Continuous  Infusions: . sodium chloride 50 mL/hr at 09/26/17 0030     LOS: 2 days    Time spent in minutes: 35    Debbe Odea, MD [facility] Pager: [email or web address] [facility] 09/26/2017, 8:42 AM

## 2017-09-26 NOTE — Progress Notes (Addendum)
Pt drowsy but responsive and oriented to person, place, and situation. Pt C/O new onset CP but couldn't describe the CP or quantify CP numerically. She was able to tell me CP was at right chest and High. Denied SOB. BP=115/79, HR=87, POX=100% on 3L [place]. Temp 99.5. Troponin level was 0.04. Lactic Acid level=2.1 Arby Barrette was texted paged, and ordered Nitro 0.4mg  SLx3 Q5 mins and morphine 2mg IV once and EKG.

## 2017-09-26 NOTE — Progress Notes (Signed)
[facility] Progress Note  Michele [name] 67 y.o. [birth year]  CC:  GI bleed   Subjective: Patient resting comfortably. Denied any GI symptoms. Caregiver at bedside. She denied any further bleeding episodes. Also discussed with nursing staff. No bowel movement since yesterday. No evidence of active bleeding.  ROS : Had chest pain yesterday which is resolved now.   Objective: Vital signs in last 24 hours: Vitals:   09/26/17 0737 09/26/17 0740  BP: 118/71 118/71  Pulse:  82  Resp:  15  Temp: 97.6 F (36.4 C)   SpO2:  100%    Physical Exam:  Gen. Resting comfortably in the bed. Not in acute distress Heart. Regular rhythm regular. Abdomen. Soft, nontender, nondistended, bowel sounds present. No peritoneal signs  Lab Results: Recent Labs    09/25/17 1007 09/26/17 0218  NA 134* 135  K 3.6 3.4*  CL 96* 96*  CO2 22 29  GLUCOSE 138* 135*  BUN 34* 28*  CREATININE 0.92 0.92  CALCIUM 8.1* 8.3*   Recent Labs    09/24/17 2009  AST 28  ALT 17  ALKPHOS 61  BILITOT 0.5  PROT 6.2*  ALBUMIN 3.4*   Recent Labs    09/24/17 2219 09/25/17 1007  WBC 28.4* 25.7*  HGB 4.4* 7.6*  HCT 14.7* 23.4*  MCV 75.0* 81.0  PLT 304 263   Recent Labs    09/25/17 1007 09/26/17 0218  LABPROT 17.0* 14.9  INR 1.40 1.17      Assessment/Plan: - Coffee-ground emesis and melena while on aspirin and Coumadin. EGD yesterday showed old clotted blood in the stomach as well as third portion of the duodenum. No bleeding lesion was identified. - Paroxysmal atrial fibrillation. Coumadin currently on hold  Recommendations --------------------------- - Repeat CBC pending this morning. BUN is trending down. Discussed with the nursing staff. No further melena. No evidence of active bleeding at this time. - Continue clear liquid diet for now. Okay to advance to full liquid if CBC showed improvement in hemoglobin. - Continue PPI - GI will follow  Otis Brace MD,  [place] 09/26/2017, 8:52 AM  Contact #  [phone]

## 2017-09-26 NOTE — Progress Notes (Signed)
Pt BP=95/80 (BP slowly declining). HR=86 POX=100% on oxygen. No CBC was ordered for this morning. [name] was texted page about the above and ordered CBC for this morning.

## 2017-09-26 NOTE — Progress Notes (Signed)
Patient c/o 'chest pain' (epigastric area).  Denies nausea.  GI cocktail given.  Will reassess shortly.

## 2017-09-26 NOTE — Progress Notes (Signed)
Patient slept several hours with CPAP.  Now awake, alert, speaking in full sentences.  Reported to off-going shift to ensure Respiratory places patient on CPAP tonight.

## 2017-09-26 NOTE — Progress Notes (Signed)
Patient alert, sitting up in bed eating.  Does not report any nausea.  Daughter and caregiver at bedside.

## 2017-09-26 NOTE — Telephone Encounter (Signed)
Patient is in the [facility] and wanted to make Dr. Gwenlyn Found aware.

## 2017-09-26 NOTE — Progress Notes (Signed)
Pt CP went for high to low after 3 doses of nitro SL and morphine. EKG was negative. Arby Barrette, NP was texted page with update on pt's condition.

## 2017-09-26 NOTE — Telephone Encounter (Signed)
Returned call, patient current admit at [facility], request to make Dr. Gwenlyn Found aware.    Message routed to Dr. Gwenlyn Found to make aware.

## 2017-09-26 NOTE — Progress Notes (Signed)
Pt stated that CP was gone.

## 2017-09-27 ENCOUNTER — Encounter (HOSPITAL_COMMUNITY): Payer: Self-pay | Admitting: Gastroenterology

## 2017-09-27 LAB — BASIC METABOLIC PANEL
Anion gap: 7 (ref 5–15)
BUN: 14 mg/dL (ref 6–20)
CHLORIDE: 102 mmol/L (ref 101–111)
CO2: 29 mmol/L (ref 22–32)
Calcium: 8.3 mg/dL — ABNORMAL LOW (ref 8.9–10.3)
Creatinine, Ser: 0.72 mg/dL (ref 0.44–1.00)
GFR calc Af Amer: >60 mL/min (ref >60)
GFR calc non Af Amer: >60 mL/min (ref >60)
Glucose, Bld: 92 mg/dL (ref 65–99)
POTASSIUM: 3.3 mmol/L — AB (ref 3.5–5.1)
SODIUM: 138 mmol/L (ref 135–145)

## 2017-09-27 LAB — PREPARE FRESH FROZEN PLASMA
UNIT DIVISION: 0
UNIT DIVISION: 0
Unit division: 0
Unit division: 0

## 2017-09-27 LAB — PREPARE RBC (CROSSMATCH)

## 2017-09-27 LAB — CBC
HCT: 23.8 % — ABNORMAL LOW (ref 36.0–46.0)
HEMATOCRIT: 26.9 % — AB (ref 36.0–46.0)
HEMOGLOBIN: 7.3 g/dL — AB (ref 12.0–15.0)
Hemoglobin: 8.5 g/dL — ABNORMAL LOW (ref 12.0–15.0)
MCH: 26.1 pg (ref 26.0–34.0)
MCH: 27.2 pg (ref 26.0–34.0)
MCHC: 30.7 g/dL (ref 30.0–36.0)
MCHC: 31.6 g/dL (ref 30.0–36.0)
MCV: 85 fL (ref 78.0–100.0)
MCV: 85.9 fL (ref 78.0–100.0)
PLATELETS: 225 10*3/uL (ref 150–400)
Platelets: 213 10*3/uL (ref 150–400)
RBC: 2.8 MIL/uL — AB (ref 3.87–5.11)
RBC: 3.13 MIL/uL — AB (ref 3.87–5.11)
RDW: 19.9 % — ABNORMAL HIGH (ref 11.5–15.5)
RDW: 18.9 % — ABNORMAL HIGH (ref 11.5–15.5)
WBC: 10.7 10*3/uL — AB (ref 4.0–10.5)
WBC: 10.8 10*3/uL — AB (ref 4.0–10.5)

## 2017-09-27 LAB — BPAM FFP
BLOOD PRODUCT EXPIRATION DATE: 201903142359
BLOOD PRODUCT EXPIRATION DATE: 201903142359
BLOOD PRODUCT EXPIRATION DATE: 201903152359
Blood Product Expiration Date: 201903152359
ISSUE DATE / TIME: 201903100740
ISSUE DATE / TIME: 201903100740
UNIT TYPE AND RH: 5100
UNIT TYPE AND RH: 5100
Unit Type and Rh: 5100
Unit Type and Rh: 5100

## 2017-09-27 LAB — GLUCOSE, CAPILLARY
GLUCOSE-CAPILLARY: 108 mg/dL — AB (ref 65–99)
GLUCOSE-CAPILLARY: 80 mg/dL (ref 65–99)
GLUCOSE-CAPILLARY: 81 mg/dL (ref 65–99)
Glucose-Capillary: 105 mg/dL — ABNORMAL HIGH (ref 65–99)
Glucose-Capillary: 98 mg/dL (ref 65–99)
Glucose-Capillary: 77 mg/dL (ref 65–99)

## 2017-09-27 LAB — HEMOGLOBIN AND HEMATOCRIT, BLOOD
HEMATOCRIT: 26.9 % — AB (ref 36.0–46.0)
Hemoglobin: 8.5 g/dL — ABNORMAL LOW (ref 12.0–15.0)

## 2017-09-27 MED ORDER — SODIUM CHLORIDE 0.9 % IV SOLN
Freq: Once | INTRAVENOUS | Status: AC
  Administered 2017-09-27: 11:00:00 via INTRAVENOUS

## 2017-09-27 MED ORDER — TRAMADOL HCL 50 MG PO TABS
50.0000 mg | ORAL_TABLET | Freq: Once | ORAL | Status: AC
  Administered 2017-09-27: 50 mg via ORAL
  Filled 2017-09-27: qty 1

## 2017-09-27 NOTE — Progress Notes (Signed)
[facility] Progress Note  Michele [name] 67 y.o. 1951/03/29  CC:  GI bleed   Subjective: Patient sitting comfortably in the recliner. Denied any GI symptoms. No further bleeding episodes.  ROS : Negative for chest pain or shortness of breath   Objective: Vital signs in last 24 hours: Vitals:   09/27/17 0852 09/27/17 1050  BP: (!) 108/59 (!) 107/54  Pulse: 80   Resp:  13  Temp:    SpO2:      Physical Exam:  Gen. Elderly.. Not in acute distress Heart. Regular rhythm regular. Abdomen. Soft, nontender, nondistended, bowel sounds present. No peritoneal signs Neuro : A/O X 3  Lab Results: Recent Labs    09/25/17 1007 09/26/17 0218  NA 134* 135  K 3.6 3.4*  CL 96* 96*  CO2 22 29  GLUCOSE 138* 135*  BUN 34* 28*  CREATININE 0.92 0.92  CALCIUM 8.1* 8.3*   Recent Labs    09/24/17 2009  AST 28  ALT 17  ALKPHOS 61  BILITOT 0.5  PROT 6.2*  ALBUMIN 3.4*   Recent Labs    09/26/17 2226 09/27/17 0854  WBC 12.0* 10.7*  HGB 7.4* 7.3*  HCT 24.3* 23.8*  MCV 84.4 85.0  PLT 225 225   Recent Labs    09/25/17 1007 09/26/17 0218  LABPROT 17.0* 14.9  INR 1.40 1.17      Assessment/Plan: - Coffee-ground emesis and melena while on aspirin and Coumadin. EGD 03/10  showed old  blood in the stomach as well as third portion of the duodenum. No bleeding lesion was identified. - Paroxysmal atrial fibrillation. Coumadin currently on hold  Recommendations --------------------------- - Hemoglobin relatively stable. BUN is trending down.  No further melena. No evidence of active bleeding at this time. - Advance diet to soft  - If continues to drop hemoglobin, may consider repeat EGD tomorrow prior to restarting Coumadin. - We'll keep nothing by mouth past midnight - Continue PPI - GI will follow  Otis Brace MD, [place] 09/27/2017, 10:51 AM  Contact #  [phone]

## 2017-09-27 NOTE — Evaluation (Signed)
Physical Therapy Evaluation Patient Details Name: Michele [name] MRN: [id number] DOB: Dec 24, 1950 Today's Date: 09/27/2017   History of Present Illness   67 y/o female with DM2, A-fib on Coumadin, CAD s/p stent, asthma, HTN, OSA, PTSD who presents for chest pain, nausea, vomiting, bloody diarrhea and confusion and lethargy. Dx of GIB.   Clinical Impression  Pt admitted with above diagnosis. Pt currently with functional limitations due to the deficits listed below (see PT Problem List). Pt ambulated 10' +15' with RW and min/guard assist 2* h/o frequent falls. No dyspnea with ambulation. Pt would benefit from HHPT upon acute DC.  Pt will benefit from skilled PT to increase their independence and safety with mobility to allow discharge to the venue listed below.       Follow Up Recommendations Home health PT    Equipment Recommendations  None recommended by PT    Recommendations for Other Services       Precautions / Restrictions Precautions Precautions: Fall Precaution Comments: falls about every 2 weeks per daughter, L knee "gives way" Restrictions Weight Bearing Restrictions: No      Mobility  Bed Mobility Overal bed mobility: Modified Independent             General bed mobility comments: supine to sit with HOB up 45*, used rail  Transfers Overall transfer level: Needs assistance Equipment used: Rolling walker (2 wheeled) Transfers: Sit to/from Stand Sit to Stand: Mod assist         General transfer comment: min A from elevated bed, mod A from low commode  Ambulation/Gait Ambulation/Gait assistance: Min guard Ambulation Distance (Feet): 25 Feet(10' + 15' with seated rest on commode) Assistive device: Rolling walker (2 wheeled) Gait Pattern/deviations: Step-through pattern;Decreased stride length   Gait velocity interpretation: Below normal speed for age/gender General Gait Details: distance limited by fatigue, no SOB, no loss of balance, poor waveform on  pulse ox so no reliable SaO2 reading -it was 95% on room air at rest  Stairs            Wheelchair Mobility    Modified Rankin (Stroke Patients Only)       Balance Overall balance assessment: History of Falls(family reports pt falls about every 2 weeks 2* LLE buckling)                                           Pertinent Vitals/Pain Pain Assessment: No/denies pain    Home Living Family/patient expects to be discharged to:: Private residence Living Arrangements: Children Available Help at Discharge: Available 24 hours/day;Family Type of Home: House Home Access: Stairs to enter Entrance Stairs-Rails: Chemical engineer of Steps: 6 Home Layout: Two level;Able to live on main level with bedroom/bathroom Home Equipment: Shower seat - built in;Hand held shower head;Grab bars - tub/shower;Walker - standard;Cane - quad;Cane - single point;Walker - 2 wheels Additional Comments: on 2L O2  24* at home    Prior Function Level of Independence: Needs assistance   Gait / Transfers Assistance Needed: walks with quad cane or RW  ADL's / Homemaking Assistance Needed: health aid helps with bathing and grooming, but pt reports she can do it when the aide is not there, daughter helps withiADLs; aide comes 3x/week        Hand Dominance   Dominant Hand: Right    Extremity/Trunk Assessment   Upper Extremity Assessment Upper Extremity Assessment:  Overall [facility] for tasks assessed    Lower Extremity Assessment Lower Extremity Assessment: Generalized weakness(B knee ext 4/5; h/o L knee buckling (daughter reports 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 but not candidate for TKA 2* her weight; decreased sensation to light touch B feet medially (this is baseline))    Cervical / Trunk Assessment Cervical / Trunk Assessment: Normal  Communication   Communication: No difficulties  Cognition Arousal/Alertness: Awake/alert Behavior During Therapy: WFL for tasks  assessed/performed Overall Cognitive Status: Within Functional Limits for tasks assessed                                        General Comments      Exercises     Assessment/Plan    PT Assessment Patient needs continued PT services  PT Problem List Decreased balance;Decreased activity tolerance;Decreased mobility       PT Treatment Interventions Gait training;Therapeutic activities;Therapeutic exercise;Balance training;Patient/family education    PT Goals (Current goals can be found in the Care Plan section)  Acute Rehab PT Goals Patient Stated Goal: likes to shop PT Goal Formulation: With patient/family Time For Goal Achievement: 10/11/17 Potential to Achieve Goals: Good    Frequency Min 3X/week   Barriers to discharge        Co-evaluation               AM-PAC PT "6 Clicks" Daily Activity  Outcome Measure Difficulty turning over in bed (including adjusting bedclothes, sheets and blankets)?: A Little Difficulty moving from lying on back to sitting on the side of the bed? : A Lot Difficulty sitting down on and standing up from a chair with arms (e.g., wheelchair, bedside commode, etc,.)?: Unable Help needed moving to and from a bed to chair (including a wheelchair)?: A Little Help needed walking in hospital room?: A Little Help needed climbing 3-5 steps with a railing? : A Lot 6 Click Score: 14    End of Session Equipment Utilized During Treatment: Gait belt Activity Tolerance: Patient tolerated treatment well Patient left: in chair;with call bell/phone within reach;with family/visitor present Nurse Communication: Mobility status PT Visit Diagnosis: Repeated falls (R29.6);Difficulty in walking, not elsewhere classified (R26.2)    Time: 6659-9357 PT Time Calculation (min) (ACUTE ONLY): 53 min   Charges:   PT Evaluation $PT Eval Low Complexity: 1 Low PT Treatments $Gait Training: 8-22 mins $Therapeutic Activity: 8-22 mins   PT G  Codes:          [name] 09/27/2017, 10:38 AM [phone]

## 2017-09-27 NOTE — Progress Notes (Signed)
Patient complaining of 8/10 right rib cage pain stating hurts worse with inspiration.  RN gave Tylenol at 2301 for this pain, ordered q6h PRN, no other pain PRN's currently ordered.  Also patient's hemoglobin at 0842 on 09/26/2017 was 8.0.  Hemoglobin at 2226 on 09/26/2017 was 7.4.  [facility] p[facility] with this information.

## 2017-09-27 NOTE — Evaluation (Signed)
Occupational Therapy Evaluation Patient Details Name: Michele [name] MRN: [id number] DOB: 08/16/1950 Today's Date: 09/27/2017    History of Present Illness  67 y/o female with DM2, A-fib on Coumadin, CAD s/p stent, asthma, HTN, OSA, PTSD who presents for chest pain, nausea, vomiting, bloody diarrhea and confusion and lethargy. Dx of GIB.    Clinical Impression   PTA, pt had assistance for dressing and bathing tasks 3 times per week from home health aide and lived with her daughter. She reports utilizing RW for functional mobility but does neglect to use this at times. Pt demonstrates poor motor planning skills, decreased executive functioning, decreased awareness, generalized weakness, and decreased activity tolerance for ADL. She is highly unstable on her feet and requires min assist for toilet transfers, min assist for LB ADL, and min assist for standing grooming tasks. Pt would benefit from continued OT services while admitted. Due to history of multiple falls and current functional deficits, recommend 24 hour assistance as well as home health OT follow-up post-acute D/C to maximize safety.    Follow Up Recommendations  Home health OT;Supervision/Assistance - 24 hour    Equipment Recommendations  3 in 1 bedside commode    Recommendations for Other Services       Precautions / Restrictions Precautions Precautions: Fall Precaution Comments: falls about every 2 weeks per daughter, L knee "gives way" Restrictions Weight Bearing Restrictions: No      Mobility Bed Mobility Overal bed mobility: Modified Independent             General bed mobility comments: Able to pull herself back up in bed.   Transfers Overall transfer level: Needs assistance Equipment used: Rolling walker (2 wheeled) Transfers: Sit to/from Stand Sit to Stand: Min assist         General transfer comment: Min assist for stability and motor planning.     Balance Overall balance assessment: History  of Falls(falls very frequently)                                         ADL either performed or assessed with clinical judgement   ADL Overall ADL's : Needs assistance/impaired Eating/Feeding: Set up;Sitting   Grooming: Min guard;Standing   Upper Body Bathing: Set up;Sitting   Lower Body Bathing: Sit to/from stand;Minimal assistance   Upper Body Dressing : Set up;Sitting   Lower Body Dressing: Minimal assistance;Sit to/from stand   Toilet Transfer: Minimal assistance;Ambulation Toilet Transfer Details (indicate cue type and reason): Holding IV pole. Daughter assisting pt back to bed from bathroom on my arrival.  Toileting- Water quality scientist and Hygiene: Minimal assistance;Sit to/from stand       Functional mobility during ADLs: Minimal assistance General ADL Comments: Pt unstable and with poor motor planning skills for gross motor tasks.      Vision Baseline Vision/History: Wears glasses Wears Glasses: At all times Additional Comments: Reports poor depth perception. WIll continue to assess.      Perception Perception Spatial deficits: Decreased depth perception and she reports that this impacts her falling.     Praxis      Pertinent Vitals/Pain Pain Assessment: No/denies pain     Hand Dominance Right   Extremity/Trunk Assessment Upper Extremity Assessment Upper Extremity Assessment: Generalized weakness   Lower Extremity Assessment Lower Extremity Assessment: Generalized weakness   Cervical / Trunk Assessment Cervical / Trunk Assessment: Normal   Communication Communication Communication:  No difficulties   Cognition Arousal/Alertness: Awake/alert Behavior During Therapy: WFL for tasks assessed/performed Overall Cognitive Status: Impaired/Different from baseline Area of Impairment: Problem solving;Awareness                           Awareness: Emergent Problem Solving: Slow processing;Difficulty sequencing General  Comments: Difficulty with motor planning. Decreased executive functioning.    General Comments  Family is not interested in SNF    Exercises     Shoulder Instructions      Home Living Family/patient expects to be discharged to:: Private residence Living Arrangements: Children Available Help at Discharge: Available 24 hours/day;Family Type of Home: House Home Access: Stairs to enter [employer] of Steps: 6 Entrance Stairs-Rails: Left;Right Home Layout: Two level;Able to live on main level with bedroom/bathroom     Bathroom Shower/Tub: Occupational psychologist: Handicapped height     Home Equipment: Shower seat - built in;Hand held shower head;Grab bars - tub/shower;Walker - standard;Cane - quad;Cane - single point;Walker - 2 wheels          Prior Functioning/Environment Level of Independence: Needs assistance  Gait / Transfers Assistance Needed: walks with quad cane or RW ADL's / Homemaking Assistance Needed: Clara aide assists with bathing and grooming (3 days per week). Daughter does meal prep and home management.             OT Problem List: Decreased strength;Decreased range of motion;Decreased activity tolerance;Impaired balance (sitting and/or standing);Decreased safety awareness;Decreased knowledge of use of DME or AE;Decreased knowledge of precautions;Decreased cognition      OT Treatment/Interventions: Self-care/ADL training;Therapeutic exercise;Energy conservation;DME and/or AE instruction;Patient/family education;Balance training;Therapeutic activities;Cognitive remediation/compensation    OT Goals(Current goals can be found in the care plan section) Acute Rehab OT Goals Patient Stated Goal: wants to go home OT Goal Formulation: With patient Time For Goal Achievement: 10/11/17 Potential to Achieve Goals: Good ADL Goals Pt Will Perform Grooming: standing;with supervision Pt Will Perform Lower Body Dressing: with supervision;sit to/from  stand Pt Will Transfer to Toilet: with supervision;ambulating;bedside commode(BSC over toilet) Pt Will Perform Toileting - Clothing Manipulation and hygiene: with supervision;sit to/from stand Additional ADL Goal #1: Pt will demonstrate anticipatory awareness during standing grooming tasks. Additional ADL Goal #2: Pt will implement 2 external memory aides into daily routine in order to complete simple meal preparation.  OT Frequency: Min 2X/week   Barriers to D/C:            Co-evaluation              AM-PAC PT "6 Clicks" Daily Activity     Outcome Measure Help from another person eating meals?: A Little Help from another person taking care of personal grooming?: A Little Help from another person toileting, which includes using toliet, bedpan, or urinal?: A Little Help from another person bathing (including washing, rinsing, drying)?: A Little Help from another person to put on and taking off regular upper body clothing?: A Little Help from another person to put on and taking off regular lower body clothing?: A Little 6 Click Score: 18   End of Session Nurse Communication: Mobility status  Activity Tolerance: Patient tolerated treatment well Patient left: in bed;with call bell/phone within reach;with family/visitor present  OT Visit Diagnosis: Other abnormalities of gait and mobility (R26.89);Other symptoms and signs involving cognitive function                Time: 8416-6063 OT Time Calculation (min): 13  min Charges:  OT General Charges $OT Visit: 1 Visit OT Evaluation $OT Eval Moderate Complexity: 1 Mod G-Codes:     Norman Herrlich, MS OTR/L  Pager: [place] 09/27/2017, 2:02 PM

## 2017-09-27 NOTE — Progress Notes (Signed)
PROGRESS NOTE    Michele [name]   XIP:[id number]  DOB: 12-07-1950  DOA: 09/24/2017 PCP: [name], MD   Brief Narrative:  Michele [name] with a 67 y/o female with DM2, A-fib on Coumadin, CAD s/p stent, asthma, HTN, OSA, PTSD who presents for chest pain, nausea, vomiting, bloody diarrhea and confusion and lethargy ER findings: Hb 4.3, INR 2.92, temp 100.1, tachycardia, WBC 23.9, sodium 131, Cr 1.14, Glucose 269, FOB +.    Subjective: Alert today and per daughter, mental status is back to baseline. She has no complaints. She has not had a BM yet in the hospital    Assessment & Plan:   Principal Problem:   GIB (gastrointestinal bleeding) Acute blood loss anemia   - presented with hematemesis - CT abd/pelvis w/o contrast shows only a small umbilical hernia - 3 u PRBC transfused- Hb was 8.9 in 1/9   - INR reversed with Vit K- now 1.17 - Cipro and Flagyl started in ER- called by RN later that patient and daughter state that she has not had any diarrhea-  d/c C diff PCR - s/p EGD - gastritis and blood noted in stomach and duodenum but no active bleeding and therefore source undetermined  - GI recommends daily Protonix   - cont to follow H/H Q 12 & cont to follow for further bleeding - anemia panel in 07/27/17 revealed elevated Ferritin, low Iron saturation and normal Iron binding, normal folate-  - 3/12- Hb drop to 7.3 noted - will give 1 U PRBC   Active Problems: Fever - very low grade at 100.1 while in ER- no obvious cause- has not recurred - UA negative- CXR negative- no respiratory symptoms - noted to have gr + cocci in 1 set of cultures, anaerobic bottle (? Contaminant)   Hypotension with h/o Essential hypertension - on HCTZ, Cardizem, Imdur, Loperssor at home - cont Lopressor only for now (due to A-fib history)    Paroxysmal A-fib  CHA2DS2-VASc Score at least 4 - Sinus tach in ER possibly due to volume loss-  - holding Coumadin- GI to recommend when to resume  anticoagulation - cont Lopressor   - HR much improved now - ECHO noted below-   Acute metabolic encephalopathy - due to acute illness- per caretaker, she has been very somnolent since last Friday prior to the GI symptoms - 3/11- received Morphine last night and was up in the middle of the night with chest pain per chart and may be sleepy because of this- cont to follow- avoid narcotics and other sedatives - 3/12- has returned to baseline    Major depressive disorder, recurrent episode, moderate  - cont Zoloft, Cogentin and Abilify  DM2 - hold Metformin- cont SSI- sugars quite stable   DVT prophylaxis: SCDs Code Status: Full code Family Communication: with daughter Disposition Plan: cont to follow HB Consultants:   GI Procedures:  EGD: Normal esophagus. - Gastritis. - Duodenal diverticulum. - Clotted blood in the greater curvature of the stomach. - The examination was otherwise normal. - Blood in the third portion of the duodenum. - No specimens collected. no signs of active bleeding  2 D ECHO Impressions:  - Normal LV size with mild LV hypertrophy. EF 65-70%, vigorous   systolic function. Normal RV size and systolic function. No   significant valvular abnormalities.  Antimicrobials:  Anti-infectives (From admission, onward)   Start     Dose/Rate Route Frequency Ordered Stop   09/24/17 2315  metroNIDAZOLE (FLAGYL) IVPB 500  mg  Status:  Discontinued     500 mg 100 mL/hr over 60 Minutes Intravenous Every 8 hours 09/24/17 2254 09/25/17 1245   09/24/17 2315  ciprofloxacin (CIPRO) IVPB 400 mg  Status:  Discontinued     400 mg 200 mL/hr over 60 Minutes Intravenous Every 12 hours 09/24/17 2313 09/25/17 1245       Objective: Vitals:   09/27/17 0144 09/27/17 0415 09/27/17 0845 09/27/17 0852  BP:  109/60 (!) 108/59 (!) 108/59  Pulse:  72  80  Resp:  (!) 22    Temp:  97.8 F (36.6 C) 98.6 F (37 C)   TempSrc:  Axillary Oral   SpO2: 98% 99% 97%   Weight:  127.9 kg  (281 lb 15.5 oz)    Height:        Intake/Output Summary (Last 24 hours) at 09/27/2017 0951 Last data filed at 09/27/2017 0645 Gross per 24 hour  Intake 1894.16 ml  Output 975 ml  Net 919.16 ml   Filed Weights   09/25/17 0948 09/26/17 0503 09/27/17 0415  Weight: 126.7 kg (279 lb 5.2 oz) 128.8 kg (283 lb 15.2 oz) 127.9 kg (281 lb 15.5 oz)    Examination: General exam: Appears comfortable  HEENT: PERRLA, oral mucosa moist, no sclera icterus or thrush Respiratory system: Clear to auscultation. Respiratory effort normal. Cardiovascular system: S1 & S2 heard,  No murmurs  Gastrointestinal system: Abdomen soft, non-tender, nondistended. Normal bowel sound. No organomegaly Central nervous system: Alert and oriented. No focal neurological deficits. Extremities: No cyanosis, clubbing or edema Skin: No rashes or ulcers Psychiatry:  Mood & affect appropriate.      Data Reviewed: I have personally reviewed following labs and imaging studies  CBC: Recent Labs  Lab 09/24/17 2219 09/25/17 1007 09/26/17 0842 09/26/17 2226 09/27/17 0854  WBC 28.4* 25.7* 14.8* 12.0* 10.7*  HGB 4.4* 7.6* 8.0* 7.4* 7.3*  HCT 14.7* 23.4* 24.8* 24.3* 23.8*  MCV 75.0* 81.0 83.2 84.4 85.0  PLT 304 263 237 225 633   Basic Metabolic Panel: Recent Labs  Lab 09/24/17 2009 09/25/17 1007 09/26/17 0218  NA 131* 134* 135  K 4.1 3.6 3.4*  CL 95* 96* 96*  CO2 18* 22 29  GLUCOSE 269* 138* 135*  BUN 41* 34* 28*  CREATININE 1.14* 0.92 0.92  CALCIUM 8.7* 8.1* 8.3*   GFR: Estimated Creatinine Clearance: 77.1 mL/min (by C-G formula based on SCr of 0.92 mg/dL). Liver Function Tests: Recent Labs  Lab 09/24/17 2009  AST 28  ALT 17  ALKPHOS 61  BILITOT 0.5  PROT 6.2*  ALBUMIN 3.4*   Recent Labs  Lab 09/24/17 2009  LIPASE 26   No results for input(s): AMMONIA in the last 168 hours. Coagulation Profile: Recent Labs  Lab 09/24/17 2110 09/25/17 1007 09/26/17 0218  INR 2.92 1.40 1.17   Cardiac  Enzymes: Recent Labs  Lab 09/25/17 1007 09/25/17 2146 09/26/17 0218  TROPONINI 0.03* 0.04* <0.03   BNP (last 3 results) No results for input(s): PROBNP in the last 8760 hours. HbA1C: Recent Labs    09/25/17 1007  HGBA1C 4.9   CBG: Recent Labs  Lab 09/26/17 1637 09/26/17 2156 09/27/17 0119 09/27/17 0414 09/27/17 0741  GLUCAP 111* 90 105* 108* 98   Lipid Profile: Recent Labs    09/25/17 1007  CHOL 103  HDL 31*  LDLCALC 40  TRIG 160*  CHOLHDL 3.3   Thyroid Function Tests: No results for input(s): TSH, T4TOTAL, FREET4, T3FREE, THYROIDAB in the last 72 hours.  Anemia Panel: No results for input(s): VITAMINB12, FOLATE, FERRITIN, TIBC, IRON, RETICCTPCT in the last 72 hours. Urine analysis:    Component Value Date/Time   COLORURINE STRAW (A) 09/25/2017 0640   APPEARANCEUR CLEAR 09/25/2017 0640   LABSPEC 1.009 09/25/2017 0640   LABSPEC 1.025 06/23/2007 0832   PHURINE 5.0 09/25/2017 0640   GLUCOSEU NEGATIVE 09/25/2017 0640   HGBUR MODERATE (A) 09/25/2017 0640   BILIRUBINUR NEGATIVE 09/25/2017 0640   BILIRUBINUR negative 04/28/2015 1157   BILIRUBINUR neg 12/08/2014 1227   BILIRUBINUR Negative 06/23/2007 0832   KETONESUR NEGATIVE 09/25/2017 0640   PROTEINUR NEGATIVE 09/25/2017 0640   UROBILINOGEN 0.2 04/28/2015 1157   UROBILINOGEN 0.2 01/30/2009 0628   NITRITE NEGATIVE 09/25/2017 0640   LEUKOCYTESUR NEGATIVE 09/25/2017 0640   LEUKOCYTESUR Negative 06/23/2007 0832   Sepsis Labs: @LABRCNTIP (procalcitonin:4,lacticidven:4) ) Recent Results (from the past 240 hour(s))  Culture, blood (x 2)     Status: Abnormal (Preliminary result)   Collection Time: 09/25/17 10:07 AM  Result Value Ref Range Status   Specimen Description BLOOD RIGHT ARM  Final   Special Requests   Final    BOTTLES DRAWN AEROBIC AND ANAEROBIC Blood Culture results may not be optimal due to an inadequate volume of blood received in culture bottles   Culture  Setup Time   Final    [place] CRITICAL RESULT CALLED TO, READ BACK BY AND VERIFIED WITH: E MARTIN,PHARMD AT 1301 09/26/17 BY L BENFIELD    Culture (A)  Final    STAPHYLOCOCCUS SPECIES (COAGULASE NEGATIVE) THE SIGNIFICANCE OF ISOLATING THIS ORGANISM FROM A SINGLE SET OF BLOOD CULTURES WHEN MULTIPLE SETS ARE DRAWN IS UNCERTAIN. PLEASE NOTIFY THE MICROBIOLOGY DEPARTMENT WITHIN ONE WEEK IF SPECIATION AND SENSITIVITIES ARE REQUIRED. Performed at [facility], [place] [address], [place], [place] [address]    Report Status PENDING  Incomplete  Blood Culture ID Panel (Reflexed)     Status: Abnormal   Collection Time: 09/25/17 10:07 AM  Result Value Ref Range Status   Enterococcus species NOT DETECTED NOT DETECTED Final   Listeria monocytogenes NOT DETECTED NOT DETECTED Final   Staphylococcus species DETECTED (A) NOT DETECTED Final    Comment: Methicillin (oxacillin) susceptible coagulase negative staphylococcus. Possible blood culture contaminant (unless isolated from more than one blood culture draw or clinical case suggests pathogenicity). No antibiotic treatment is indicated for blood  culture contaminants. CRITICAL RESULT CALLED TO, READ BACK BY AND VERIFIED WITH: E MARTIN,PHARMD AT 1301 09/26/17 BY L BENFIELD    Staphylococcus aureus NOT DETECTED NOT DETECTED Final   Methicillin resistance NOT DETECTED NOT DETECTED Final   Streptococcus species NOT DETECTED NOT DETECTED Final   Streptococcus agalactiae NOT DETECTED NOT DETECTED Final   Streptococcus pneumoniae NOT DETECTED NOT DETECTED Final   Streptococcus pyogenes NOT DETECTED NOT DETECTED Final   Acinetobacter baumannii NOT DETECTED NOT DETECTED Final   Enterobacteriaceae species NOT DETECTED NOT DETECTED Final   Enterobacter cloacae complex NOT DETECTED NOT DETECTED Final   Escherichia coli NOT DETECTED NOT DETECTED Final   Klebsiella oxytoca NOT DETECTED NOT DETECTED Final   Klebsiella pneumoniae NOT DETECTED NOT DETECTED Final    Proteus species NOT DETECTED NOT DETECTED Final   Serratia marcescens NOT DETECTED NOT DETECTED Final   Haemophilus influenzae NOT DETECTED NOT DETECTED Final   Neisseria meningitidis NOT DETECTED NOT DETECTED Final   Pseudomonas aeruginosa NOT DETECTED NOT DETECTED Final   Candida albicans NOT DETECTED NOT DETECTED Final   Candida glabrata NOT  DETECTED NOT DETECTED Final   Candida krusei NOT DETECTED NOT DETECTED Final   Candida parapsilosis NOT DETECTED NOT DETECTED Final   Candida tropicalis NOT DETECTED NOT DETECTED Final    Comment: Performed at [facility], [place] [address], [place], [place] [address]  Culture, blood (x 2)     Status: None (Preliminary result)   Collection Time: 09/25/17 10:23 AM  Result Value Ref Range Status   Specimen Description BLOOD LEFT HAND  Final   Special Requests IN PEDIATRIC BOTTLE Blood Culture adequate volume  Final   Culture   Final    NO GROWTH 1 DAY Performed at [facility], [place] [address], [place], [place] [address]    Report Status PENDING  Incomplete  MRSA PCR Screening     Status: None   Collection Time: 09/25/17  6:52 PM  Result Value Ref Range Status   MRSA by PCR NEGATIVE NEGATIVE Final    Comment:        The GeneXpert MRSA Assay (FDA approved for NASAL specimens only), is one component of a comprehensive MRSA colonization surveillance program. It is not intended to diagnose MRSA infection nor to guide or monitor treatment for MRSA infections. Performed at [facility], [place] [address] [place], [place] [address]          Radiology Studies: Ir Picc Placement Right >5 Yrs Inc Img Guide  Result Date: 09/26/2017 CLINICAL DATA:  GI bleed, anemia and poor intravenous access. EXAM: PICC LINE PLACEMENT WITH ULTRASOUND AND FLUOROSCOPIC GUIDANCE FLUOROSCOPY TIME:  18 seconds.  2.0 mGy. PROCEDURE: The patient was advised of the possible risks and complications and agreed to undergo the procedure. The patient was then  brought to the angiographic suite for the procedure. The right/left arm was prepped with chlorhexidine, draped in the usual sterile fashion using maximum barrier technique (cap and mask, sterile gown, sterile gloves, large sterile sheet, hand hygiene and cutaneous antisepsis) and infiltrated locally with 1% Lidocaine. Ultrasound demonstrated patency of the right basilic vein, and this was documented with an image. Under real-time ultrasound guidance, this vein was accessed with a 21 gauge micropuncture needle and image documentation was performed. A 0.018 wire was introduced in to the vein. Over this, a 5.0 [place] dual lumen power injectable PICC was advanced to the lower SVC/right atrial junction. Fluoroscopy during the procedure and fluoro spot radiograph confirms appropriate catheter position. The catheter was flushed and covered with a sterile dressing. Catheter length: 39 cm COMPLICATIONS: None IMPRESSION: Successful right arm power injectable PICC line placement with ultrasound and fluoroscopic guidance. The catheter is ready for use. Electronically Signed   By: Aletta Edouard M.D.   On: 09/26/2017 12:04   [place] Ekg Site Rite  Result Date: 09/25/2017 If Site Rite image not attached, placement could not be confirmed due to current cardiac rhythm.     Scheduled Meds: . ARIPiprazole  5 mg Oral Daily  . benztropine  0.5 mg Oral QHS  . insulin aspart  0-5 Units Subcutaneous QHS  . insulin aspart  0-9 Units Subcutaneous Q4H  . metoprolol tartrate  25 mg Oral BID  . pantoprazole  40 mg Oral Daily  . sertraline  100 mg Oral Daily  . sodium chloride flush  10-40 mL Intracatheter Q12H   Continuous Infusions: . sodium chloride 50 mL/hr at 09/27/17 0643  . sodium chloride       LOS: 3 days    Time spent in minutes: 35  Debbe Odea, MD [facility] Pager: [email or web address] Password [email or web address] 09/27/2017, 9:51 AM

## 2017-09-28 DIAGNOSIS — K922 Gastrointestinal hemorrhage, unspecified: Secondary | ICD-10-CM

## 2017-09-28 DIAGNOSIS — G9341 Metabolic encephalopathy: Secondary | ICD-10-CM

## 2017-09-28 DIAGNOSIS — D62 Acute posthemorrhagic anemia: Principal | ICD-10-CM

## 2017-09-28 DIAGNOSIS — D649 Anemia, unspecified: Secondary | ICD-10-CM

## 2017-09-28 DIAGNOSIS — N179 Acute kidney failure, unspecified: Secondary | ICD-10-CM

## 2017-09-28 LAB — BPAM RBC
BLOOD PRODUCT EXPIRATION DATE: 201904042359
Blood Product Expiration Date: 201904042359
Blood Product Expiration Date: 201904052359
Blood Product Expiration Date: 201904052359
ISSUE DATE / TIME: 201903092233
ISSUE DATE / TIME: 201903101310
ISSUE DATE / TIME: 201903121024
ISSUE DATE / TIME: 201903100330
UNIT TYPE AND RH: 5100
Unit Type and Rh: 5100
Unit Type and Rh: 5100
Unit Type and Rh: 5100

## 2017-09-28 LAB — BASIC METABOLIC PANEL
Anion gap: 8 (ref 5–15)
BUN: 12 mg/dL (ref 6–20)
CO2: 28 mmol/L (ref 22–32)
CREATININE: 0.71 mg/dL (ref 0.44–1.00)
Calcium: 8.3 mg/dL — ABNORMAL LOW (ref 8.9–10.3)
Chloride: 104 mmol/L (ref 101–111)
Glucose, Bld: 96 mg/dL (ref 65–99)
Potassium: 3.5 mmol/L (ref 3.5–5.1)
SODIUM: 140 mmol/L (ref 135–145)

## 2017-09-28 LAB — TYPE AND SCREEN
ABO/RH(D): O POS
ANTIBODY SCREEN: NEGATIVE
UNIT DIVISION: 0
Unit division: 0
Unit division: 0
Unit division: 0

## 2017-09-28 LAB — CULTURE, BLOOD (ROUTINE X 2)

## 2017-09-28 LAB — GLUCOSE, CAPILLARY
GLUCOSE-CAPILLARY: 151 mg/dL — AB (ref 65–99)
GLUCOSE-CAPILLARY: 70 mg/dL (ref 65–99)
GLUCOSE-CAPILLARY: 88 mg/dL (ref 65–99)
GLUCOSE-CAPILLARY: 90 mg/dL (ref 65–99)
Glucose-Capillary: 88 mg/dL (ref 65–99)
Glucose-Capillary: 100 mg/dL — ABNORMAL HIGH (ref 65–99)

## 2017-09-28 LAB — CBC
HCT: 26.1 % — ABNORMAL LOW (ref 36.0–46.0)
Hemoglobin: 8.3 g/dL — ABNORMAL LOW (ref 12.0–15.0)
MCH: 27.5 pg (ref 26.0–34.0)
MCHC: 31.8 g/dL (ref 30.0–36.0)
MCV: 86.4 fL (ref 78.0–100.0)
PLATELETS: 192 10*3/uL (ref 150–400)
RBC: 3.02 MIL/uL — AB (ref 3.87–5.11)
RDW: 19.1 % — AB (ref 11.5–15.5)
WBC: 8.6 10*3/uL (ref 4.0–10.5)

## 2017-09-28 LAB — HEPARIN LEVEL (UNFRACTIONATED): Heparin Unfractionated: <0.1 IU/mL — ABNORMAL LOW (ref 0.30–0.70)

## 2017-09-28 MED ORDER — HEPARIN (PORCINE) IN NACL 100-0.45 UNIT/ML-% IJ SOLN
1200.0000 [IU]/h | INTRAMUSCULAR | Status: DC
  Administered 2017-09-28: 1000 [IU]/h via INTRAVENOUS
  Filled 2017-09-28: qty 250

## 2017-09-28 NOTE — Plan of Care (Signed)
  Progressing Clinical Measurements: Will remain free from infection 09/28/2017 0603 - Progressing by Colonel Bald, RN Note No s/s of infection noted. Respiratory complications will improve 09/28/2017 0603 - Progressing by Colonel Bald, RN Note No s/s of respiratory complications. Cardiovascular complication will be avoided 09/28/2017 0603 - Progressing by Colonel Bald, RN Note No s/s of cardiovascular complications.

## 2017-09-28 NOTE — Progress Notes (Signed)
ANTICOAGULATION CONSULT NOTE  Pharmacy Consult:  Heparin  Indication: atrial fibrillation  Allergies  Allergen Reactions  . Meprobamate Nausea And Vomiting    Patient Measurements: Height: 5\' 2"  (157.5 cm) Weight: 283 lb 9.6 oz (128.6 kg) IBW/kg (Calculated) : 50.1 Heparin Dosing Weight: 82  kg  Vital Signs: Temp: 99.1 F (37.3 C) (03/13 1645) Temp Source: Oral (03/13 1645) BP: 113/64 (03/13 1929) Pulse Rate: 82 (03/13 1929)  Labs: Recent Labs    09/25/17 2146 09/26/17 0218  09/27/17 0854 09/27/17 1048 09/27/17 1637 09/27/17 2055 09/28/17 0429 09/28/17 1832  HGB  --   --    < > 7.3*  --  8.5* 8.5* 8.3*  --   HCT  --   --    < > 23.8*  --  26.9* 26.9* 26.1*  --   PLT  --   --    < > 225  --   --  213 192  --   LABPROT  --  14.9  --   --   --   --   --   --   --   INR  --  1.17  --   --   --   --   --   --   --   HEPARINUNFRC  --   --   --   --   --   --   --   --  <0.10*  CREATININE  --  0.92  --   --  0.72  --   --  0.71  --   TROPONINI 0.04* <0.03  --   --   --   --   --   --   --    < > = values in this interval not displayed.    Estimated Creatinine Clearance: 89 mL/min (by C-G formula based on SCr of 0.71 mg/dL).    Assessment: 89 YOF on Coumadin PTA for history of Afib.  She was admitted on 09/24/17 with coffee ground emesis and melena while on aspirin and Coumadin. Coumadin held on admit and EGD on 09/25/17 showed oldblood in the stomach as well as third portion of the duodenum. No bleeding lesion was identified.  Heparin restarted today per GI recommendation and heparin level is undetectable.  RN reported that the pump was beeping/occluded since heparin was restarted so the level is likely inaccurate.  RN will address line issue.  No bleeding noted.   Goal of Therapy:  Heparin level 0.3-0.7 units/ml Monitor platelets by anticoagulation protocol: Yes    Plan:  Increase heparin gtt conservatively to 1200 units/hr, no bolus Check 6 hr heparin  level   Octavia Mottola D. Mina Marble, PharmD, BCPS Pager:  [phone] 09/28/2017, 7:51 PM

## 2017-09-28 NOTE — Progress Notes (Addendum)
Occupational Therapy Treatment Patient Details Name: Michele [name] MRN: [id number] DOB: Mar 20, 1951 Today's Date: 09/28/2017    History of present illness  67 y/o female with DM2, A-fib on Coumadin, CAD s/p stent, asthma, HTN, OSA, PTSD who presents for chest pain, nausea, vomiting, bloody diarrhea and confusion and lethargy. Dx of GIB.    OT comments  Pt demonstrating progress toward OT goals this session. She did require min assist to power up from low commode with use of grab bar this session . Pt continues to require verbal cues for motor planning during motor tasks and decreased activity tolerance for standing and ambulating tasks. Pt requiring seated rest break during ambulation in hallway for simulated IADL. She additionally demonstrated decreased memory this session. OT will continue to follow while admitted and continue to recommend home health OT follow-up.   Follow Up Recommendations  Home health OT;Supervision/Assistance - 24 hour    Equipment Recommendations  3 in 1 bedside commode    Recommendations for Other Services      Precautions / Restrictions Precautions Precautions: Fall Precaution Comments: falls about every 2 weeks per daughter, L knee "gives way" Restrictions Weight Bearing Restrictions: No       Mobility Bed Mobility Overal bed mobility: Modified Independent             General bed mobility comments: Able to pull herself back up in bed.   Transfers Overall transfer level: Needs assistance Equipment used: Rolling walker (2 wheeled) Transfers: Sit to/from Stand Sit to Stand: Min assist         General transfer comment: Min assist to power up from commode and cues for motor planning.     Balance Overall balance assessment: History of Falls(falls very frequently)                                         ADL either performed or assessed with clinical judgement   ADL Overall ADL's : Needs assistance/impaired      Grooming: Min guard;Standing                   Toilet Transfer: Minimal assistance;Ambulation;RW Toilet Transfer Details (indicate cue type and reason): Min assist to power up from toilet as this was a low surface.  Toileting- Clothing Manipulation and Hygiene: Min guard;Sitting/lateral lean Toileting - Clothing Manipulation Details (indicate cue type and reason): Increased effort.      Functional mobility during ADLs: Minimal assistance;Rolling walker General ADL Comments: Using RW this session. Pt easily distracted and with decreased memory impacting safety.      Vision       Perception     Praxis      Cognition Arousal/Alertness: Awake/alert Behavior During Therapy: WFL for tasks assessed/performed Overall Cognitive Status: Impaired/Different from baseline Area of Impairment: Problem solving;Awareness;Memory                     Memory: Decreased short-term memory;Decreased recall of precautions     Awareness: Emergent Problem Solving: Slow processing;Difficulty sequencing General Comments: Noted pt repeating herself and with increased confusion. Fluctuating responses to therapist.         Exercises     Shoulder Instructions       General Comments Sister present and engaged in session.     Pertinent Vitals/ Pain       Pain Assessment: No/denies pain  Home Living  Prior Functioning/Environment              Frequency  Min 2X/week        Progress Toward Goals  OT Goals(current goals can now be found in the care plan section)  Progress towards OT goals: Progressing toward goals  Acute Rehab OT Goals Patient Stated Goal: wants to go home OT Goal Formulation: With patient Time For Goal Achievement: 10/11/17 Potential to Achieve Goals: Good  Plan Discharge plan remains appropriate    Co-evaluation                 AM-PAC PT "6 Clicks" Daily Activity     Outcome  Measure   Help from another person eating meals?: A Little Help from another person taking care of personal grooming?: A Little Help from another person toileting, which includes using toliet, bedpan, or urinal?: A Little Help from another person bathing (including washing, rinsing, drying)?: A Little Help from another person to put on and taking off regular upper body clothing?: A Little Help from another person to put on and taking off regular lower body clothing?: A Little 6 Click Score: 18    End of Session Equipment Utilized During Treatment: Gait belt;Rolling walker  OT Visit Diagnosis: Other abnormalities of gait and mobility (R26.89);Other symptoms and signs involving cognitive function   Activity Tolerance Patient tolerated treatment well   Patient Left in bed;with call bell/phone within reach;with family/visitor present   Nurse Communication Mobility status        Time: 1450-1521 OT Time Calculation (min): 31 min  Charges: OT General Charges $OT Visit: 1 Visit OT Treatments $Self Care/Home Management : 23-37 mins  Norman Herrlich, MS OTR/L  Pager: [place] 09/28/2017, 3:28 PM

## 2017-09-28 NOTE — Care Management Important Message (Signed)
Important Message  Patient Details  Name: Michele [name] MRN: [id number] Date of Birth: [birth year]   Medicare Important Message Given:  Yes    Dinorah Masullo P [place] 09/28/2017, 2:14 PM

## 2017-09-28 NOTE — Progress Notes (Signed)
[facility] Progress Note  Michele [name] 67 y.o. 08-Oct-1950  CC:  GI bleed   Subjective: No bowel movement overnight or this morning. Had 1 episode of dark stool yesterday. Hemoglobin stable. Denied any GI symptoms.  ROS : Negative for chest pain or shortness of breath   Objective: Vital signs in last 24 hours: Vitals:   09/28/17 0541 09/28/17 0734  BP: 107/80 101/65  Pulse: 69 80  Resp: 14   Temp:  98.4 F (36.9 C)  SpO2: 97%     Physical Exam:  Gen. Elderly.. Not in acute distress Heart. RRR Abdomen. Soft, nontender, nondistended, bowel sounds present. No peritoneal signs Neuro : A/O X 3  Lab Results: Recent Labs    09/27/17 1048 09/28/17 0429  NA 138 140  K 3.3* 3.5  CL 102 104  CO2 29 28  GLUCOSE 92 96  BUN 14 12  CREATININE 0.72 0.71  CALCIUM 8.3* 8.3*   No results for input(s): AST, ALT, ALKPHOS, BILITOT, PROT, ALBUMIN in the last 72 hours. Recent Labs    09/27/17 2055 09/28/17 0429  WBC 10.8* 8.6  HGB 8.5* 8.3*  HCT 26.9* 26.1*  MCV 85.9 86.4  PLT 213 192   Recent Labs    09/25/17 1007 09/26/17 0218  LABPROT 17.0* 14.9  INR 1.40 1.17      Assessment/Plan: - Coffee-ground emesis and melena while on aspirin and Coumadin. EGD 03/10  showed old  blood in the stomach as well as third portion of the duodenum. No bleeding lesion was identified. - Paroxysmal atrial fibrillation. Coumadin currently on hold  Recommendations --------------------------- - Hemoglobin relatively stable. - Okay to start anticoagulation with heparin drip. If no further evidence of bleeding and hemoglobin remains stable, okay to switch to oral anticoagulation tomorrow. - Restart soft diet - Check CBC tomorrow morning - Continue PPI - GI will follow  Otis Brace MD, FACP 09/28/2017, 8:54 AM  Contact #  [phone]

## 2017-09-28 NOTE — Progress Notes (Signed)
[facility]                                                                              Patient Demographics  Michele [name], is a 67 y.o. female, DOB - [birth year], BMS:[id number]  Admit date - 09/24/2017   Admitting Physician Ivor Costa, MD  Outpatient Primary MD for the patient is [name], MD  Outpatient specialists:   LOS - 4  days   Medical records reviewed and are as summarized below:    Chief Complaint  Patient presents with  . Chest Pain  . Diarrhea       Brief summary   Michele [name] with a 67 y/o female with DM2, A-fib on Coumadin, CAD s/p stent, asthma, HTN, OSA, PTSD who presents for chest pain, nausea, vomiting, bloody diarrhea and confusion and lethargy ER findings: Hb 4.3, INR 2.92, temp 100.1, tachycardia, WBC 23.9, sodium 131, Cr 1.14, Glucose 269, FOB +.      Assessment & Plan    Principal Problem:   GIB (gastrointestinal bleeding) with acute blood loss anemia  -Patient presented with hematemesis and hemoglobin of 4.3 at the time of admission, FOBT positive -CT abdomen pelvis showed small umbilical hernia, was transfused 4 units packed RBCs, reversed INR with vitamin K. -GI was consulted, underwent EGD which showed gastritis, blood noted in stomach, duodenum but no active bleeding, therefore source undetermined. -GI recommended daily Protonix, seen by GI today, recommended start anti-correlation with heparin drip, if no further evidence of bleeding, H&H remained stable, switch to oral anticoagulation in a.m. -Start heparin drip without bolus with pharmacy.  Active Problems:  Fevers -Low-grade temp 100.1 degrees  in the ED, no obvious cause -UA negative, chest x-ray negative for pneumonia -Blood cultures 1/2 showed methicillin susceptible coagulase-negative staph, likely contaminant  Hypotension with history of essential hypertension -On HCTZ, Cardizem, Imdur, Lopressor at home, currently held -Continue Lopressor only  A. fib history     Asthma, mild intermittent -Stable,  no issues    Paroxysmal A-fib (HCC) - CAHDS VASC 4 Coumadin has been held due to.  GI bleed -GI recommended starting heparin drip today, if H&H remains stable, may resume oral anticoagulation tomorrow    Major depressive disorder, recurrent episode, moderate (HCC) -Continue Zoloft, Cogentin, Abilify   Acute metabolic encephalopathy -Currently improved, likely due to acute illness.  Avoid narcotics, sedatives -Currently appears to be at baseline   Diabetes mellitus type 2 -Hold metformin, continue sliding scale insulin while inpatient     Code Status: Full code DVT Prophylaxis: Heparin drip Family Communication: Discussed in detail with the patient, all imaging results, lab results explained to the patient   Disposition Plan: Not medically ready today  Time Spent in minutes 35 minutes  Procedures:  EGD: Normal esophagus. - Gastritis. - Duodenal diverticulum. - Clotted blood in the greater curvature of the stomach. - The examination was otherwise normal. - Blood in the third portion of the duodenum. - No specimens collected. no signs of active bleeding  2 D ECHO Impressions:  - Normal LV size with mild LV hypertrophy. EF 22-33%, vigorous systolic function. Normal RV size and systolic  function. No significant valvular abnormalities.    Consultants:   GI  Antimicrobials:      Medications  Scheduled Meds: . ARIPiprazole  5 mg Oral Daily  . benztropine  0.5 mg Oral QHS  . insulin aspart  0-5 Units Subcutaneous QHS  . insulin aspart  0-9 Units Subcutaneous Q4H  . metoprolol tartrate  25 mg Oral BID  . pantoprazole  40 mg Oral Daily  . sertraline  100 mg Oral Daily  . sodium chloride flush  10-40 mL Intracatheter Q12H   Continuous Infusions: . sodium chloride 50 mL/hr at 09/28/17 0609   PRN Meds:.acetaminophen **OR** acetaminophen, gi cocktail, levalbuterol, metoprolol tartrate, ondansetron  **OR** ondansetron (ZOFRAN) IV, sodium chloride flush   Antibiotics   Anti-infectives (From admission, onward)   Start     Dose/Rate Route Frequency Ordered Stop   09/24/17 2315  metroNIDAZOLE (FLAGYL) IVPB 500 mg  Status:  Discontinued     500 mg 100 mL/hr over 60 Minutes Intravenous Every 8 hours 09/24/17 2254 09/25/17 1245   09/24/17 2315  ciprofloxacin (CIPRO) IVPB 400 mg  Status:  Discontinued     400 mg 200 mL/hr over 60 Minutes Intravenous Every 12 hours 09/24/17 2313 09/25/17 1245        Subjective:   Michele [name] was seen and examined today.  Feeling a lot better today, no active bleeding overnight, no BM. Patient denies dizziness, chest pain, shortness of breath, abdominal pain, N/V/D/C, new weakness, numbess, tingling. No acute events overnight.    Objective:   Vitals:   09/28/17 0541 09/28/17 0734 09/28/17 0911 09/28/17 0917  BP: 107/80 101/65 (!) 102/54 (!) 102/54  Pulse: 69 80 64   Resp: 14   13  Temp:  98.4 F (36.9 C)    TempSrc:  Oral    SpO2: 97%     Weight: 128.6 kg (283 lb 9.6 oz)     Height:        Intake/Output Summary (Last 24 hours) at 09/28/2017 1107 Last data filed at 09/28/2017 8563 Gross per 24 hour  Intake 2227.84 ml  Output 500 ml  Net 1727.84 ml     Wt Readings from Last 3 Encounters:  09/28/17 128.6 kg (283 lb 9.6 oz)  07/27/17 131 kg (288 lb 14.4 oz)  05/27/17 129.9 kg (286 lb 6.4 oz)     Exam  General: Alert and oriented x 3, NAD  Eyes: PERRLA, EOMI, Anicteric Sclera,  HEENT:  Atraumatic, normocephalic, normal oropharynx  Cardiovascular: S1 S2 auscultated, no rubs, murmurs or gallops. Regular rate and rhythm.  Respiratory: Clear to auscultation bilaterally, no wheezing, rales or rhonchi  Gastrointestinal: Soft, nontender, nondistended, + bowel sounds  Ext: no pedal edema bilaterally  Neuro: AAOx3, Cr N's II- XII. Strength 5/5 upper and lower extremities bilaterally, speech clear, sensations grossly  intact  Musculoskeletal: No digital cyanosis, clubbing  Skin: No rashes  Psych: Normal affect and demeanor, alert and oriented x3    Data Reviewed:  I have personally reviewed following labs and imaging studies  Micro Results Recent Results (from the past 240 hour(s))  Culture, blood (x 2)     Status: Abnormal   Collection Time: 09/25/17 10:07 AM  Result Value Ref Range Status   Specimen Description BLOOD RIGHT ARM  Final   Special Requests   Final    BOTTLES DRAWN AEROBIC AND ANAEROBIC Blood Culture results may not be optimal due to an inadequate volume of blood received in culture bottles   Culture  Setup Time   Final    GRAM POSITIVE COCCI ANAEROBIC BOTTLE ONLY CRITICAL RESULT CALLED TO, READ BACK BY AND VERIFIED WITH: E MARTIN,PHARMD AT 1301 09/26/17 BY L BENFIELD    Culture (A)  Final    STAPHYLOCOCCUS SPECIES (COAGULASE NEGATIVE) THE SIGNIFICANCE OF ISOLATING THIS ORGANISM FROM A SINGLE SET OF BLOOD CULTURES WHEN MULTIPLE SETS ARE DRAWN IS UNCERTAIN. PLEASE NOTIFY THE MICROBIOLOGY DEPARTMENT WITHIN ONE WEEK IF SPECIATION AND SENSITIVITIES ARE REQUIRED. Performed at [facility], [place] [address] [place], [place] [address]    Report Status 09/28/2017 FINAL  Final  Blood Culture ID Panel (Reflexed)     Status: Abnormal   Collection Time: 09/25/17 10:07 AM  Result Value Ref Range Status   Enterococcus species NOT DETECTED NOT DETECTED Final   Listeria monocytogenes NOT DETECTED NOT DETECTED Final   Staphylococcus species DETECTED (A) NOT DETECTED Final    Comment: Methicillin (oxacillin) susceptible coagulase negative staphylococcus. Possible blood culture contaminant (unless isolated from more than one blood culture draw or clinical case suggests pathogenicity). No antibiotic treatment is indicated for blood  culture contaminants. CRITICAL RESULT CALLED TO, READ BACK BY AND VERIFIED WITH: E MARTIN,PHARMD AT 1301 09/26/17 BY L BENFIELD    Staphylococcus aureus NOT  DETECTED NOT DETECTED Final   Methicillin resistance NOT DETECTED NOT DETECTED Final   Streptococcus species NOT DETECTED NOT DETECTED Final   Streptococcus agalactiae NOT DETECTED NOT DETECTED Final   Streptococcus pneumoniae NOT DETECTED NOT DETECTED Final   Streptococcus pyogenes NOT DETECTED NOT DETECTED Final   Acinetobacter baumannii NOT DETECTED NOT DETECTED Final   Enterobacteriaceae species NOT DETECTED NOT DETECTED Final   Enterobacter cloacae complex NOT DETECTED NOT DETECTED Final   Escherichia coli NOT DETECTED NOT DETECTED Final   Klebsiella oxytoca NOT DETECTED NOT DETECTED Final   Klebsiella pneumoniae NOT DETECTED NOT DETECTED Final   Proteus species NOT DETECTED NOT DETECTED Final   Serratia marcescens NOT DETECTED NOT DETECTED Final   Haemophilus influenzae NOT DETECTED NOT DETECTED Final   Neisseria meningitidis NOT DETECTED NOT DETECTED Final   Pseudomonas aeruginosa NOT DETECTED NOT DETECTED Final   Candida albicans NOT DETECTED NOT DETECTED Final   Candida glabrata NOT DETECTED NOT DETECTED Final   Candida krusei NOT DETECTED NOT DETECTED Final   Candida parapsilosis NOT DETECTED NOT DETECTED Final   Candida tropicalis NOT DETECTED NOT DETECTED Final    Comment: Performed at [facility], [address] [place], [place] [address]  Culture, blood (x 2)     Status: None (Preliminary result)   Collection Time: 09/25/17 10:23 AM  Result Value Ref Range Status   Specimen Description BLOOD LEFT HAND  Final   Special Requests IN PEDIATRIC BOTTLE Blood Culture adequate volume  Final   Culture   Final    NO GROWTH 2 DAYS Performed at [facility], [place] [address] [place], [place] [address]    Report Status PENDING  Incomplete  MRSA PCR Screening     Status: None   Collection Time: 09/25/17  6:52 PM  Result Value Ref Range Status   MRSA by PCR NEGATIVE NEGATIVE Final    Comment:        The GeneXpert MRSA Assay (FDA approved for NASAL specimens only),  is one component of a comprehensive MRSA colonization surveillance program. It is not intended to diagnose MRSA infection nor to guide or monitor treatment for MRSA infections. Performed at [facility], [place]  [address], [place], [place] [address]     Radiology Reports Ct Abdomen Pelvis Wo Contrast  Result Date: 09/25/2017 CLINICAL DATA:  Acute onset of central chest pain, nausea, vomiting and diarrhea. EXAM: CT ABDOMEN AND PELVIS WITHOUT CONTRAST TECHNIQUE: Multidetector CT imaging of the abdomen and pelvis was performed following the standard protocol without IV contrast. COMPARISON:  Abdominal ultrasound performed 05/29/2007 FINDINGS: Lower chest: The visualized lung bases are grossly clear. Mild coronary artery calcification is noted. Hepatobiliary: The liver is unremarkable in appearance. The patient is status post cholecystectomy, with clips noted at the gallbladder fossa. The common bile duct remains normal in caliber. Pancreas: The pancreas is within normal limits. Spleen: The spleen is unremarkable in appearance. Adrenals/Urinary Tract: The adrenal glands are unremarkable in appearance. The kidneys are within normal limits. There is no evidence of hydronephrosis. No renal or ureteral stones are identified. No perinephric stranding is seen. Stomach/Bowel: The stomach is unremarkable in appearance. The small bowel is within normal limits. The appendix is normal in caliber, without evidence of appendicitis. The colon is unremarkable in appearance. Vascular/Lymphatic: The abdominal aorta is unremarkable in appearance. The inferior vena cava is grossly unremarkable. No retroperitoneal lymphadenopathy is seen. No pelvic sidewall lymphadenopathy is identified. Reproductive: The bladder is mildly distended and grossly unremarkable. The uterus is unremarkable in appearance. The ovaries are relatively symmetric. No suspicious adnexal masses are seen. Other: A small umbilical hernia is noted,  containing only fat. Musculoskeletal: No acute osseous abnormalities are identified. Multilevel vacuum phenomenon is noted along the lower thoracic and lumbar spine. The visualized musculature is unremarkable in appearance. IMPRESSION: 1. No acute abnormality seen within the abdomen or pelvis. 2. Small umbilical hernia, containing only fat. 3. Mild coronary artery calcification. Electronically Signed   By: Garald Balding M.D.   On: 09/25/2017 03:09   Dg Chest 2 View  Result Date: 09/24/2017 CLINICAL DATA:  Central chest pain starting 2 days ago, nausea, vomiting and diarrhea. Shortness of breath, malaise, fatigue and altered mental status. EXAM: CHEST - 2 VIEW COMPARISON:  Chest x-ray dated 04/11/2017 FINDINGS: Hypoinspiratory exam. Heart size and mediastinal contours are stable. Given the low lung volumes, lungs appear clear. No pleural effusion or pneumothorax seen. No acute or suspicious osseous finding. IMPRESSION: Low lung volumes. No active cardiopulmonary disease. No evidence of pneumonia or pulmonary edema. Electronically Signed   By: Franki Cabot M.D.   On: 09/24/2017 19:57   Ct Head Wo Contrast  Result Date: 09/25/2017 CLINICAL DATA:  Chest pain nausea vomiting fatigue and altered mental status EXAM: CT HEAD WITHOUT CONTRAST TECHNIQUE: Contiguous axial images were obtained from the base of the skull through the vertex without intravenous contrast. COMPARISON:  MRI 04/21/2017, head CT 04/11/2017 FINDINGS: Brain: No acute territorial infarction, hemorrhage, or intracranial mass. Mild small vessel ischemic changes of the white matter. 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 Stable ventricle size Vascular: No hyperdense vessels.  Scattered carotid calcification Skull: Normal. Negative for fracture or focal lesion. Sinuses/Orbits: No acute finding. Other: None IMPRESSION: 1. No CT evidence for acute intracranial abnormality. 2. Mild small vessel ischemic changes of the white matter.  Electronically Signed   By: Donavan Foil M.D.   On: 09/25/2017 01:56   Ir Picc Placement Right >5 Yrs Inc Img Guide  Result Date: 09/26/2017 CLINICAL DATA:  GI bleed, anemia and poor intravenous access. EXAM: PICC LINE PLACEMENT WITH ULTRASOUND AND FLUOROSCOPIC GUIDANCE FLUOROSCOPY TIME:  18 seconds.  2.0 mGy. PROCEDURE: The patient was advised of the  possible risks and complications and agreed to undergo the procedure. The patient was then brought to the angiographic suite for the procedure. The right/left arm was prepped with chlorhexidine, draped in the usual sterile fashion using maximum barrier technique (cap and mask, sterile gown, sterile gloves, large sterile sheet, hand hygiene and cutaneous antisepsis) and infiltrated locally with 1% Lidocaine. Ultrasound demonstrated patency of the right basilic vein, and this was documented with an image. Under real-time ultrasound guidance, this vein was accessed with a 21 gauge micropuncture needle and image documentation was performed. A 0.018 wire was introduced in to the vein. Over this, a 5.0 [place] dual lumen power injectable PICC was advanced to the lower SVC/right atrial junction. Fluoroscopy during the procedure and fluoro spot radiograph confirms appropriate catheter position. The catheter was flushed and covered with a sterile dressing. Catheter length: 39 cm COMPLICATIONS: None IMPRESSION: Successful right arm power injectable PICC line placement with ultrasound and fluoroscopic guidance. The catheter is ready for use. Electronically Signed   By: Aletta Edouard M.D.   On: 09/26/2017 12:04   [place] Ekg Site Rite  Result Date: 09/25/2017 If Site Rite image not attached, placement could not be confirmed due to current cardiac rhythm.   Lab Data:  CBC: Recent Labs  Lab 09/26/17 0842 09/26/17 2226 09/27/17 0854 09/27/17 1637 09/27/17 2055 09/28/17 0429  WBC 14.8* 12.0* 10.7*  --  10.8* 8.6  HGB 8.0* 7.4* 7.3* 8.5* 8.5* 8.3*  HCT 24.8* 24.3*  23.8* 26.9* 26.9* 26.1*  MCV 83.2 84.4 85.0  --  85.9 86.4  PLT 237 225 225  --  213 888   Basic Metabolic Panel: Recent Labs  Lab 09/24/17 2009 09/25/17 1007 09/26/17 0218 09/27/17 1048 09/28/17 0429  NA 131* 134* 135 138 140  K 4.1 3.6 3.4* 3.3* 3.5  CL 95* 96* 96* 102 104  CO2 18* 22 29 29 28   GLUCOSE 269* 138* 135* 92 96  BUN 41* 34* 28* 14 12  CREATININE 1.14* 0.92 0.92 0.72 0.71  CALCIUM 8.7* 8.1* 8.3* 8.3* 8.3*   GFR: Estimated Creatinine Clearance: 89 mL/min (by C-G formula based on SCr of 0.71 mg/dL). Liver Function Tests: Recent Labs  Lab 09/24/17 2009  AST 28  ALT 17  ALKPHOS 61  BILITOT 0.5  PROT 6.2*  ALBUMIN 3.4*   Recent Labs  Lab 09/24/17 2009  LIPASE 26   No results for input(s): AMMONIA in the last 168 hours. Coagulation Profile: Recent Labs  Lab 09/24/17 2110 09/25/17 1007 09/26/17 0218  INR 2.92 1.40 1.17   Cardiac Enzymes: Recent Labs  Lab 09/25/17 1007 09/25/17 2146 09/26/17 0218  TROPONINI 0.03* 0.04* <0.03   BNP (last 3 results) No results for input(s): PROBNP in the last 8760 hours. HbA1C: No results for input(s): HGBA1C in the last 72 hours. CBG: Recent Labs  Lab 09/27/17 1619 09/27/17 2028 09/28/17 0044 09/28/17 0537 09/28/17 0739  GLUCAP 81 77 151* 90 88   Lipid Profile: No results for input(s): CHOL, HDL, LDLCALC, TRIG, CHOLHDL, LDLDIRECT in the last 72 hours. Thyroid Function Tests: No results for input(s): TSH, T4TOTAL, FREET4, T3FREE, THYROIDAB in the last 72 hours. Anemia Panel: No results for input(s): VITAMINB12, FOLATE, FERRITIN, TIBC, IRON, RETICCTPCT in the last 72 hours. Urine analysis:    Component Value Date/Time   COLORURINE STRAW (A) 09/25/2017 0640   APPEARANCEUR CLEAR 09/25/2017 0640   LABSPEC 1.009 09/25/2017 0640   LABSPEC 1.025 06/23/2007 0832   PHURINE 5.0 09/25/2017 0640   GLUCOSEU  NEGATIVE 09/25/2017 0640   HGBUR MODERATE (A) 09/25/2017 0640   BILIRUBINUR NEGATIVE 09/25/2017 0640    BILIRUBINUR negative 04/28/2015 1157   BILIRUBINUR neg 12/08/2014 1227   BILIRUBINUR Negative 06/23/2007 0832   KETONESUR NEGATIVE 09/25/2017 0640   PROTEINUR NEGATIVE 09/25/2017 0640   UROBILINOGEN 0.2 04/28/2015 1157   UROBILINOGEN 0.2 01/30/2009 0628   NITRITE NEGATIVE 09/25/2017 0640   LEUKOCYTESUR NEGATIVE 09/25/2017 0640   LEUKOCYTESUR Negative 06/23/2007 0832     Ripudeep Rai M.D. [facility] 09/28/2017, 11:07 AM  Pager: [phone] Between 7am to 7pm - call Pager - [phone]  After 7pm go to [email or web address] - password [email or web address]  Call night coverage person covering after 7pm

## 2017-09-28 NOTE — Plan of Care (Signed)
  Progressing Clinical Measurements: Will remain free from infection 09/28/2017 2245 - Progressing by Colonel Bald, RN Note No s/s of infection noted. Respiratory complications will improve 09/28/2017 2245 - Progressing by Colonel Bald, RN Note No s/s of respiratory complications noted. Cardiovascular complication will be avoided 09/28/2017 2245 - Progressing by Colonel Bald, RN Note No s/s of cardiovascular complication noted. Activity: Risk for activity intolerance will decrease 09/28/2017 2245 - Progressing by Colonel Bald, RN Note Ambulating to bathroom with walker and standby assist without difficulty.

## 2017-09-28 NOTE — Progress Notes (Signed)
ANTICOAGULATION CONSULT NOTE - Initial Consult  Pharmacy Consult for Heparin  Indication: atrial fibrillation  Allergies  Allergen Reactions  . Meprobamate Nausea And Vomiting    Patient Measurements: Height: 5\' 2"  (157.5 cm) Weight: 283 lb 9.6 oz (128.6 kg) IBW/kg (Calculated) : 50.1 Heparin Dosing Weight: 82.4  kg  Vital Signs: Temp: 98.4 F (36.9 C) (03/13 0734) Temp Source: Oral (03/13 0734) BP: 102/54 (03/13 0917) Pulse Rate: 64 (03/13 0911)  Labs: Recent Labs    09/25/17 2146 09/26/17 0218  09/27/17 0854 09/27/17 1048 09/27/17 1637 09/27/17 2055 09/28/17 0429  HGB  --   --    < > 7.3*  --  8.5* 8.5* 8.3*  HCT  --   --    < > 23.8*  --  26.9* 26.9* 26.1*  PLT  --   --    < > 225  --   --  213 192  LABPROT  --  14.9  --   --   --   --   --   --   INR  --  1.17  --   --   --   --   --   --   CREATININE  --  0.92  --   --  0.72  --   --  0.71  TROPONINI 0.04* <0.03  --   --   --   --   --   --    < > = values in this interval not displayed.    Estimated Creatinine Clearance: 89 mL/min (by C-G formula based on SCr of 0.71 mg/dL).   Medical History: Past Medical History:  Diagnosis Date  . Allergic rhinitis   . ARF (acute renal failure) ([place]) 03/2017  . 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 Asthma   . CAD (coronary artery disease)   . CAD (coronary artery disease) 01/30/2007   stents trivial coronary artery disease diffusely, with a recent deployment od a intracoronary artery stent, 3.5 x 12 mm driver stent with no more than 20-30% in- stents restenosis.done by Dr [name] with re-look on novenber 10 2008 revealing a widely patent stent with otherwise trival CAD and normal LV function  . Carpal tunnel syndrome, right   . Chest pain 07/17/12009   2 D Echo EF >55%  . Complication of anesthesia    slow to wake up  . Depression   . Diabetes mellitus type II   . Gait instability   . Hyperlipidemia   . Hypertension   . Iron deficiency anemia  07/13/2016  . Obesity   . Paroxysmal A-fib ([place])   . PTSD (post-traumatic stress disorder)   . Sleep apnea     Medications:  Medications Prior to Admission  Medication Sig Dispense Refill Last Dose  . ADVAIR DISKUS 250-50 MCG/DOSE AEPB INHALE ONE PUFF TWICE DAILY 60 each 5 09/24/2017 at Unknown time  . albuterol (VENTOLIN HFA) 108 (90 Base) MCG/ACT inhaler Inhale 1 puff into the lungs every 6 (six) hours as needed for wheezing or shortness of breath. 18 g 4 Past Week at Unknown time  . ARIPiprazole (ABILIFY) 5 MG tablet Take 1 tablet (5 mg total) by mouth daily. 90 tablet 0 09/24/2017 at Unknown time  . benztropine (COGENTIN) 0.5 MG tablet Take 1 tablet (0.5 mg total) by mouth at bedtime. 90 tablet 0 09/23/2017 at Unknown time  . diltiazem (CARDIZEM CD) 180 MG 24 hr capsule Take 180 mg by mouth daily.  0 09/24/2017 at Unknown time  . gabapentin (NEURONTIN) 300 MG capsule Take 300 mg by mouth at bedtime.  0 09/23/2017 at Unknown time  . hydrochlorothiazide (MICROZIDE) 12.5 MG capsule TAKE ONE CAPSULE BY MOUTH DAILY 30 capsule 3 09/24/2017 at Unknown time  . irbesartan-hydrochlorothiazide (AVALIDE) 150-12.5 MG tablet Take 1 tablet by mouth daily. 30 tablet 11 09/24/2017 at Unknown time  . isosorbide mononitrate (IMDUR) 30 MG 24 hr tablet TAKE ONE TABLET BY MOUTH EVERY DAY. 30 tablet 11 09/24/2017 at Unknown time  . metFORMIN (GLUCOPHAGE-XR) 500 MG 24 hr tablet TAKE ONE (1) TABLET BY MOUTH EVERY DAY WITH BREAKFAST 90 tablet 3 09/24/2017 at Unknown time  . metoprolol tartrate (LOPRESSOR) 25 MG tablet Take 1 tablet (25 mg total) by mouth 2 (two) times daily.   09/24/2017 at 0800  . mometasone (NASONEX) 50 MCG/ACT nasal spray Place 2 sprays into the nose daily. 17 g 2 09/24/2017 at Unknown time  . nitroGLYCERIN (NITROSTAT) 0.4 MG SL tablet PLACE 1 TABLET UNDER THE TONGUE EVERY 5 MINUTES AS NEEDED FOR CHEST PAIN.  MAY REPEAT 3 TIMES.  IF NO RELIEF CALL DOCTOR 25 tablet 11 09/23/2017 at Unknown time  . QC LO-DOSE ASPIRIN 81  MG EC tablet TAKE ONE (1) TABLET BY MOUTH EVERY DAY 30 tablet 3 09/24/2017 at Unknown time  . rosuvastatin (CRESTOR) 10 MG tablet TAKE ONE (1) TABLET BY MOUTH EVERY DAY (Patient taking differently: TAKE ONE (1) TABLET (10 MG)  BY MOUTH EVERY DAY) 90 tablet 1 09/24/2017 at Unknown time  . sertraline (ZOLOFT) 100 MG tablet TAKE TWO (2) TABLETS BY MOUTH DAILY 180 tablet 0 09/24/2017 at Unknown time  . warfarin (COUMADIN) 7.5 MG tablet TAKE ONE AND ONE-HALF TO TWO TABLETS BY MOUTH DAILY AS DIRECTED (Patient taking differently: TAKE 11.25 MG BY MOUTH DAILY) 150 tablet 0 09/24/2017 at 1500  . feeding supplement (BOOST / RESOURCE BREEZE) LIQD Take 1 Container by mouth 3 (three) times daily between meals. (Patient not taking: Reported on 09/24/2017)  0 Not Taking at Unknown time  . HYDROcodone-acetaminophen (NORCO) 10-325 MG tablet Take 1 tablet by mouth 2 (two) times daily as needed (pain). (Patient not taking: Reported on 09/24/2017) 10 tablet 0 Completed Course at Unknown time  . levofloxacin (LEVAQUIN) 500 MG tablet Take 1 tablet (500 mg total) by mouth daily. (Patient not taking: Reported on 09/24/2017) 7 tablet 0 Completed Course at Unknown time  . methylPREDNISolone (MEDROL DOSEPAK) 4 MG TBPK tablet Use as instructed (Patient not taking: Reported on 09/24/2017) 21 tablet 0 Completed Course at Unknown time  . OXYGEN Inhale 4 L into the lungs continuous.   Taking  . PRESCRIPTION MEDICATION Inhale into the lungs at bedtime. CPAP   Taking  . Trospium Chloride 60 MG CP24 TAKE ONE CAPSULE BY MOUTH DAILY (Patient not taking: Reported on 09/24/2017) 90 each 1 Not Taking at Unknown time   Scheduled:  . ARIPiprazole  5 mg Oral Daily  . benztropine  0.5 mg Oral QHS  . insulin aspart  0-5 Units Subcutaneous QHS  . insulin aspart  0-9 Units Subcutaneous Q4H  . metoprolol tartrate  25 mg Oral BID  . pantoprazole  40 mg Oral Daily  . sertraline  100 mg Oral Daily  . sodium chloride flush  10-40 mL Intracatheter Q12H     Assessment: 67 y.o female on coumadin PTA for ho afib. Admitted 09/24/17 with coffee ground emesis and melena while on aspirin and coumadin. Coumadin held since admit for GIB with  acute blood loss anemia.  Hgb 4.4 on admit  S/p 4 units PRBCs + FFP S/p EGD 3/10 showed oldblood in the stomach as well as third portion of the duodenum. No bleeding lesion was identified. INR = 1.17 on 3/11 s/p vitamin K 5mg  IVPB x1 on admit 3/9. Hgb 8.5 yesteday , stable at 8.3 today.  GI recommended to start anticoagulation with IV heparin today and switch to oral anticoagulation in AM 09/29/17.  Pharmacy consulted to start IV heparin (no bolus) for atrial fibrillation  Obese female, BMI =51.   Goal of Therapy:  Heparin level 0.3-0.7 units/ml Monitor platelets by anticoagulation protocol: Yes   Plan:  No bolus per MD's order Start IV heparin drip at 1000 units/hr (~12 units/kg/hr) Check heparin level 6 hours after heparin started Daily heparin level and CBC while on heparin drip  Thank you for allowing pharmacy to be part of this patients care team. Nicole Cella, [place] Clinical Pharmacist Pager: [phone] or [phone] (330p-1030p) Main Rx [phone] 09/28/2017,11:27 AM

## 2017-09-29 ENCOUNTER — Encounter (HOSPITAL_COMMUNITY): Payer: Self-pay | Admitting: Gastroenterology

## 2017-09-29 LAB — CBC
HEMATOCRIT: 26.4 % — AB (ref 36.0–46.0)
Hemoglobin: 8.3 g/dL — ABNORMAL LOW (ref 12.0–15.0)
MCH: 27 pg (ref 26.0–34.0)
MCHC: 31.4 g/dL (ref 30.0–36.0)
MCV: 86 fL (ref 78.0–100.0)
PLATELETS: 224 10*3/uL (ref 150–400)
RBC: 3.07 MIL/uL — AB (ref 3.87–5.11)
RDW: 18.6 % — ABNORMAL HIGH (ref 11.5–15.5)
WBC: 10.3 10*3/uL (ref 4.0–10.5)

## 2017-09-29 LAB — PROTIME-INR
INR: 1.07
Prothrombin Time: 13.9 seconds (ref 11.4–15.2)

## 2017-09-29 LAB — GLUCOSE, CAPILLARY
GLUCOSE-CAPILLARY: 102 mg/dL — AB (ref 65–99)
GLUCOSE-CAPILLARY: 85 mg/dL (ref 65–99)
Glucose-Capillary: 116 mg/dL — ABNORMAL HIGH (ref 65–99)
Glucose-Capillary: 167 mg/dL — ABNORMAL HIGH (ref 65–99)

## 2017-09-29 LAB — HEPARIN LEVEL (UNFRACTIONATED): Heparin Unfractionated: 0.33 IU/mL (ref 0.30–0.70)

## 2017-09-29 MED ORDER — PANTOPRAZOLE SODIUM 40 MG PO TBEC: 40.0000 mg | DELAYED_RELEASE_TABLET | Freq: Two times a day (BID) | ORAL | Status: DC

## 2017-09-29 MED ORDER — WARFARIN - PHARMACIST DOSING INPATIENT: Freq: Every day | Status: DC

## 2017-09-29 MED ORDER — DILTIAZEM HCL ER COATED BEADS 180 MG PO CP24: 180.0000 mg | ORAL_CAPSULE | Freq: Every day | ORAL | 0 refills | Status: DC

## 2017-09-29 MED ORDER — PANTOPRAZOLE SODIUM 40 MG PO TBEC: 40.0000 mg | DELAYED_RELEASE_TABLET | Freq: Two times a day (BID) | ORAL | 4 refills | Status: DC

## 2017-09-29 MED ORDER — OXYCODONE-ACETAMINOPHEN 5-325 MG PO TABS
1.0000 | ORAL_TABLET | ORAL | Status: DC | PRN
  Administered 2017-09-29: 1 via ORAL
  Filled 2017-09-29: qty 1

## 2017-09-29 MED ORDER — WARFARIN SODIUM 10 MG PO TABS
11.2500 mg | ORAL_TABLET | Freq: Once | ORAL | Status: AC
  Administered 2017-09-29: 11.25 mg via ORAL
  Filled 2017-09-29 (×2): qty 1

## 2017-09-29 MED ORDER — ISOSORBIDE MONONITRATE ER 30 MG PO TB24: 30.0000 mg | ORAL_TABLET | Freq: Every day | ORAL | 11 refills | Status: DC

## 2017-09-29 NOTE — Progress Notes (Addendum)
ANTICOAGULATION CONSULT NOTE  Pharmacy Consult: Resume Coumadin Indication: atrial fibrillation  Allergies  Allergen Reactions  . Meprobamate Nausea And Vomiting    Patient Measurements: Height: 5\' 2"  (157.5 cm) Weight: 289 lb 0.4 oz (131.1 kg) IBW/kg (Calculated) : 50.1 Heparin Dosing Weight: 82  kg  Vital Signs: Temp: 98.6 F (37 C) (03/14 0736) Temp Source: Oral (03/14 0736) BP: 108/60 (03/14 0736) Pulse Rate: 72 (03/14 0802)  Labs: Recent Labs    09/27/17 1048  09/27/17 2055 09/28/17 0429 09/28/17 1832 09/29/17 0228  HGB  --    < > 8.5* 8.3*  --  8.3*  HCT  --    < > 26.9* 26.1*  --  26.4*  PLT  --   --  213 192  --  224  LABPROT  --   --   --   --   --  13.9  INR  --   --   --   --   --  1.07  HEPARINUNFRC  --   --   --   --  <0.10* 0.33  CREATININE 0.72  --   --  0.71  --   --    < > = values in this interval not displayed.    Estimated Creatinine Clearance: 90.1 mL/min (by C-G formula based on SCr of 0.71 mg/dL).  Assessment: Michele [name] on Coumadin PTA for history of Afib.  She was admitted on 09/24/17 with coffee ground emesis and melena while on aspirin and Coumadin. Coumadin held on admit and EGD on 09/25/17 showed oldblood in the stomach as well as third portion of the duodenum. No bleeding lesion was identified.  Hgb stable while on IV heparin drip.  Had a smal amount of dark-colored stool on 3/13. Pt denied black tarry stool and denied bright red blood per rectum per GI today.  GI said okay to switch from IV heparin infusion to oral coumadin today.  Dr. Tana Coast has discontinued the heparin drip this AM and consulted pharmacy to resume coumadin.  Today INR = 1.07 after off coumadin since admit 3/9, s/p vitamin K 5mg  IV on 3/9 and FFP.  Admit INR was 2.92 therapeutic on admit 3/9 on pta dose of coumadin 11.25 mg daily.   Goal of Therapy:  INR =2-3 Monitor platelets by anticoagulation protocol: Yes   Plan:  Heparin discontinued by Dr. Tana Coast Resume coumadin today  with 11.25 mg x1. Give dose this morning, none this evening.  Protime/INR daily  If patient discharged today, may resume pta dose of 11.25 mg daily and check INR by Monday 10/03/17 at the latest for further dose adjustments at  [facility] (cardiology).   Thank you for allowing pharmacy to be part of this patients care team. Nicole Cella, [place] Clinical Pharmacist Pager: [phone] or [place] [phone] 09/29/2017 11:29 AM   Addendum: correction to pta coumadin dose confirmed with patient's caregiver and per last Anticoag-visit  09/14/17 that dose was 7.5mg  qMon and Friday and 11.25 mg all other days.   RN to give 11.25mg  po coumadin now prior to discharge then instructed pt and her caregiver to take 7.5mg  daily and to call today and make appointment for INR check on Monday 3/18. Lower dose due to this admission's low Hgb, GIB hx.  May need to target lower INR of 2-2.5.  Nicole Cella, RPh Clinical Pharmacist Pager: [phone] 09/29/2017 3:06 PM

## 2017-09-29 NOTE — Progress Notes (Signed)
Patient received discharge information and acknowledged understanding of it. Patient received prescription. Patient received 3in1. RN answered all questions. PICC line was removed by IV team.

## 2017-09-29 NOTE — Discharge Instructions (Signed)

## 2017-09-29 NOTE — Progress Notes (Signed)
Verified Hart set up w [place] Forts from [facility], requested 3/1 to be delivered to room through [facility].

## 2017-09-29 NOTE — Discharge Summary (Addendum)
Physician Discharge Summary   Patient ID: Michele [name] MRN: [id number] DOB/AGE: 10-18-1950 67 y.o.  Admit date: 09/24/2017 Discharge date: 09/29/2017  Primary Care Physician:  [name], MD   Recommendations for Outpatient Follow-up:  1. Follow up with PCP in 1-2 weeks 2. Please obtain BMP/CBC in one week  Home Health: Home health PT OT, RN Equipment/Devices: 3 in 1  Discharge Condition: stable CODE STATUS: FULL  Diet recommendation: Carb modified diet   Discharge Diagnoses:    . Acute blood loss anemia  . GIB (gastrointestinal bleeding) . History of stroke ([place]) . Paroxysmal A-fib ([place]) . Iron deficiency anemia . Major depressive disorder, recurrent episode, moderate ([place]) . Hyperlipidemia . Essential hypertension . Asthma, mild intermittent . Acute metabolic encephalopathy . AKI (acute kidney injury) ([place]) . Nausea vomiting and diarrhea  Consults: Gastroenterology    Allergies:   Allergies  Allergen Reactions  . Meprobamate Nausea And Vomiting     DISCHARGE MEDICATIONS: Allergies as of 09/29/2017      Reactions   Meprobamate Nausea And Vomiting      Medication List    STOP taking these medications   hydrochlorothiazide 12.5 MG capsule Commonly known as:  MICROZIDE   irbesartan-hydrochlorothiazide 150-12.5 MG tablet Commonly known as:  AVALIDE   QC LO-DOSE ASPIRIN 81 MG EC tablet Generic drug:  aspirin     TAKE these medications   ADVAIR DISKUS 250-50 MCG/DOSE Aepb Generic drug:  Fluticasone-Salmeterol INHALE ONE PUFF TWICE DAILY   albuterol 108 (90 Base) MCG/ACT inhaler Commonly known as:  VENTOLIN HFA Inhale 1 puff into the lungs every 6 (six) hours as needed for wheezing or shortness of breath.   ARIPiprazole 5 MG tablet Commonly known as:  ABILIFY Take 1 tablet (5 mg total) by mouth daily.   benztropine 0.5 MG tablet Commonly known as:  COGENTIN Take 1 tablet (0.5 mg total) by mouth at bedtime.   diltiazem 180 MG 24 hr  capsule Commonly known as:  CARDIZEM CD Take 1 capsule (180 mg total) by mouth daily. HOLD UNTIL FOLLOW UP WITH DR Gwenlyn Found What changed:  additional instructions   gabapentin 300 MG capsule Commonly known as:  NEURONTIN Take 300 mg by mouth at bedtime.   isosorbide mononitrate 30 MG 24 hr tablet Commonly known as:  IMDUR Take 1 tablet (30 mg total) by mouth daily. HOLD UNTIL FOLLOW-UP WITH Dr. Gwenlyn Found What changed:  additional instructions   metFORMIN 500 MG 24 hr tablet Commonly known as:  GLUCOPHAGE-XR TAKE ONE (1) TABLET BY MOUTH EVERY DAY WITH BREAKFAST   metoprolol tartrate 25 MG tablet Commonly known as:  LOPRESSOR Take 1 tablet (25 mg total) by mouth 2 (two) times daily.   mometasone 50 MCG/ACT nasal spray Commonly known as:  NASONEX Place 2 sprays into the nose daily.   nitroGLYCERIN 0.4 MG SL tablet Commonly known as:  NITROSTAT PLACE 1 TABLET UNDER THE TONGUE EVERY 5 MINUTES AS NEEDED FOR CHEST PAIN.  MAY REPEAT 3 TIMES.  IF NO RELIEF CALL DOCTOR   OXYGEN Inhale 4 L into the lungs continuous.   pantoprazole 40 MG tablet Commonly known as:  PROTONIX Take 1 tablet (40 mg total) by mouth 2 (two) times daily before a meal. Take twice a day before meals for 8 weeks, then once a day   PRESCRIPTION MEDICATION Inhale into the lungs at bedtime. CPAP   rosuvastatin 10 MG tablet Commonly known as:  CRESTOR TAKE ONE (1) TABLET BY MOUTH EVERY DAY What changed:  See  the new instructions.   sertraline 100 MG tablet Commonly known as:  ZOLOFT TAKE TWO (2) TABLETS BY MOUTH DAILY   warfarin 7.5 MG tablet Commonly known as:  COUMADIN Take as directed. If you are unsure how to take this medication, talk to your nurse or doctor. Original instructions:  TAKE ONE AND ONE-HALF TO TWO TABLETS BY MOUTH DAILY AS DIRECTED What changed:  See the new instructions.        Brief H and P: For complete details please refer to admission H and P, but in brief Michele [name] a  67 y/o female with DM2, A-fib on Coumadin, CAD s/p stent, asthma, HTN, OSA, PTSD who presents for chest pain, nausea, vomiting, bloody diarrhea and confusion and lethargy ER findings: Hb 4.3, INR 2.92, temp 100.1, tachycardia, WBC 23.9, sodium 131, Cr 1.14, Glucose 269, FOB +.    Hospital Course:     GIB (gastrointestinal bleeding) with acute blood loss anemia  -Patient presented with hematemesis and hemoglobin of 4.3 at the time of admission, FOBT positive.  INR 2.9. -CT abdomen pelvis showed small umbilical hernia, was transfused 4 units packed RBCs, reversed INR with vitamin K. -GI was consulted, underwent EGD which showed gastritis, blood noted in stomach, duodenum but no active bleeding, therefore source undetermined. -GI recommended PPI twice a day for 8 weeks then daily. - patient was started on heparin drip.  She had no further bleeding and H&H remained stable.  Per GI, okay to transition to oral Coumadin.   - Follow-up outpatient with Dr. Collene Mares   Fevers -Low-grade temp 100.1 degrees  in the ED, no obvious cause -UA negative, chest x-ray negative for pneumonia -Blood cultures 1/2 showed methicillin susceptible coagulase-negative staph, likely contaminant  Hypotension with history of essential hypertension -On HCTZ, Cardizem, Imdur, Lopressor at home, currently held -Continue Lopressor only due to history of atrial fibrillation.  Patient recommended to follow-up with her cardiologist, antihypertensives may need to be adjusted at the follow-up.    Asthma, mild intermittent -Stable, no issues    Paroxysmal A-fib ([facility]) - CAHDS VASC 4 Coumadin was held due to GI bleed, INR was reversed with vitamin K.   -Patient had no bleeding with heparin drip, transition back to oral Coumadin at the time of discharge    Major depressive disorder, recurrent episode, moderate ([place]) -Continue Zoloft, Cogentin, Abilify   Acute metabolic encephalopathy -Currently improved, likely due to acute  illness.  Avoid narcotics, sedatives -Currently back to baseline   Diabetes mellitus type 2 -Metformin was held while inpatient.    Day of Discharge S: No bleeding overnight, alert and oriented.  Eager to go home.  BP (!) 105/48 (BP Location: Left Arm)   Pulse 61   Temp 98.6 F (37 C) (Oral)   Resp 15   Ht 5' 2"  (1.575 m)   Wt 131.1 kg (289 lb 0.4 oz)   SpO2 97%   BMI 52.86 kg/m   Physical Exam: General: Alert and awake oriented x3 not in any acute distress. HEENT: anicteric sclera, pupils reactive to light and accommodation CVS: S1-S2 clear no murmur rubs or gallops Chest: clear to auscultation bilaterally, no wheezing rales or rhonchi Abdomen: soft nontender, nondistended, normal bowel sounds Extremities: no cyanosis, clubbing or edema noted bilaterally Neuro: Cranial nerves II-XII intact, no focal neurological deficits   The results of significant diagnostics from this hospitalization (including imaging, microbiology, ancillary and laboratory) are listed below for reference.      Procedures/Studies:  Ct Abdomen  Pelvis Wo Contrast  Result Date: 09/25/2017 CLINICAL DATA:  Acute onset of central chest pain, nausea, vomiting and diarrhea. EXAM: CT ABDOMEN AND PELVIS WITHOUT CONTRAST TECHNIQUE: Multidetector CT imaging of the abdomen and pelvis was performed following the standard protocol without IV contrast. COMPARISON:  Abdominal ultrasound performed 05/29/2007 FINDINGS: Lower chest: The visualized lung bases are grossly clear. Mild coronary artery calcification is noted. Hepatobiliary: The liver is unremarkable in appearance. The patient is status post cholecystectomy, with clips noted at the gallbladder fossa. The common bile duct remains normal in caliber. Pancreas: The pancreas is within normal limits. Spleen: The spleen is unremarkable in appearance. Adrenals/Urinary Tract: The adrenal glands are unremarkable in appearance. The kidneys are within normal limits. There  is no evidence of hydronephrosis. No renal or ureteral stones are identified. No perinephric stranding is seen. Stomach/Bowel: The stomach is unremarkable in appearance. The small bowel is within normal limits. The appendix is normal in caliber, without evidence of appendicitis. The colon is unremarkable in appearance. Vascular/Lymphatic: The abdominal aorta is unremarkable in appearance. The inferior vena cava is grossly unremarkable. No retroperitoneal lymphadenopathy is seen. No pelvic sidewall lymphadenopathy is identified. Reproductive: The bladder is mildly distended and grossly unremarkable. The uterus is unremarkable in appearance. The ovaries are relatively symmetric. No suspicious adnexal masses are seen. Other: A small umbilical hernia is noted, containing only fat. Musculoskeletal: No acute osseous abnormalities are identified. Multilevel vacuum phenomenon is noted along the lower thoracic and lumbar spine. The visualized musculature is unremarkable in appearance. IMPRESSION: 1. No acute abnormality seen within the abdomen or pelvis. 2. Small umbilical hernia, containing only fat. 3. Mild coronary artery calcification. Electronically Signed   By: Garald Balding M.D.   On: 09/25/2017 03:09   Dg Chest 2 View  Result Date: 09/24/2017 CLINICAL DATA:  Central chest pain starting 2 days ago, nausea, vomiting and diarrhea. Shortness of breath, malaise, fatigue and altered mental status. EXAM: CHEST - 2 VIEW COMPARISON:  Chest x-ray dated 04/11/2017 FINDINGS: Hypoinspiratory exam. Heart size and mediastinal contours are stable. Given the low lung volumes, lungs appear clear. No pleural effusion or pneumothorax seen. No acute or suspicious osseous finding. IMPRESSION: Low lung volumes. No active cardiopulmonary disease. No evidence of pneumonia or pulmonary edema. Electronically Signed   By: Franki Cabot M.D.   On: 09/24/2017 19:57   Ct Head Wo Contrast  Result Date: 09/25/2017 CLINICAL DATA:  Chest pain  nausea vomiting fatigue and altered mental status EXAM: CT HEAD WITHOUT CONTRAST TECHNIQUE: Contiguous axial images were obtained from the base of the skull through the vertex without intravenous contrast. COMPARISON:  MRI 04/21/2017, head CT 04/11/2017 FINDINGS: Brain: No acute territorial infarction, hemorrhage, or intracranial mass. Mild small vessel ischemic changes of the white matter. 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 Stable ventricle size Vascular: No hyperdense vessels.  Scattered carotid calcification Skull: Normal. Negative for fracture or focal lesion. Sinuses/Orbits: No acute finding. Other: None IMPRESSION: 1. No CT evidence for acute intracranial abnormality. 2. Mild small vessel ischemic changes of the white matter. Electronically Signed   By: Donavan Foil M.D.   On: 09/25/2017 01:56   Ir Picc Placement Right >5 Yrs Inc Img Guide  Result Date: 09/26/2017 CLINICAL DATA:  GI bleed, anemia and poor intravenous access. EXAM: PICC LINE PLACEMENT WITH ULTRASOUND AND FLUOROSCOPIC GUIDANCE FLUOROSCOPY TIME:  18 seconds.  2.0 mGy. PROCEDURE: The patient was advised of the possible risks and complications and agreed to undergo the procedure. The patient  was then brought to the angiographic suite for the procedure. The right/left arm was prepped with chlorhexidine, draped in the usual sterile fashion using maximum barrier technique (cap and mask, sterile gown, sterile gloves, large sterile sheet, hand hygiene and cutaneous antisepsis) and infiltrated locally with 1% Lidocaine. Ultrasound demonstrated patency of the right basilic vein, and this was documented with an image. Under real-time ultrasound guidance, this vein was accessed with a 21 gauge micropuncture needle and image documentation was performed. A 0.018 wire was introduced in to the vein. Over this, a 5.0 [place] dual lumen power injectable PICC was advanced to the lower SVC/right atrial junction. Fluoroscopy during the procedure and  fluoro spot radiograph confirms appropriate catheter position. The catheter was flushed and covered with a sterile dressing. Catheter length: 39 cm COMPLICATIONS: None IMPRESSION: Successful right arm power injectable PICC line placement with ultrasound and fluoroscopic guidance. The catheter is ready for use. Electronically Signed   By: Aletta Edouard M.D.   On: 09/26/2017 12:04   [place] Ekg Site Rite  Result Date: 09/25/2017 If Site Rite image not attached, placement could not be confirmed due to current cardiac rhythm.      LAB RESULTS: Basic Metabolic Panel: Recent Labs  Lab 09/27/17 1048 09/28/17 0429  NA 138 140  K 3.3* 3.5  CL 102 104  CO2 29 28  GLUCOSE 92 96  BUN 14 12  CREATININE 0.72 0.71  CALCIUM 8.3* 8.3*   Liver Function Tests: Recent Labs  Lab 09/24/17 2009  AST 28  ALT 17  ALKPHOS 61  BILITOT 0.5  PROT 6.2*  ALBUMIN 3.4*   Recent Labs  Lab 09/24/17 2009  LIPASE 26   No results for input(s): AMMONIA in the last 168 hours. CBC: Recent Labs  Lab 09/28/17 0429 09/29/17 0228  WBC 8.6 10.3  HGB 8.3* 8.3*  HCT 26.1* 26.4*  MCV 86.4 86.0  PLT 192 224   Cardiac Enzymes: Recent Labs  Lab 09/25/17 2146 09/26/17 0218  TROPONINI 0.04* <0.03   BNP: Invalid input(s): POCBNP CBG: Recent Labs  Lab 09/29/17 0738 09/29/17 1127  GLUCAP 167* 85      Disposition and Follow-up:    DISPOSITION: Home with home health   [facility] Follow up.   Specialty:  Home Health Services Why:  Registered Nurse and Physical Therapy.  Contact information: [address] [place] [address] [phone]        [name], MD. Schedule an appointment as soon as possible for a visit in 2 week(s).   Specialty:  Internal Medicine Contact information: [address] [place] [address] [phone]        Juanita Craver, MD. Schedule an appointment as soon as possible for  a visit in 3 week(s).   Specialty:  Gastroenterology Contact information: [address] [place] [address] [phone]            Time coordinating discharge:  35 minutes  Signed:   Estill Cotta M.D. [facility] 09/29/2017, 12:46 PM Pager: [phone]   Coding query addendum: Patient had profound anemia Hb 4.3 with physiological response of  hypotension, tachycardia . Sepsis was ruled out.    Estill Cotta M.D. [facility] 10/03/2017, 3:39 PM  Pager: [phone]

## 2017-09-29 NOTE — Progress Notes (Signed)
[facility] Progress Note  JAYCE BOYKO 67 y.o. 1951/01/05  CC:  GI bleed   Subjective:  Patient had a small amount of dark-colored stool yesterday. Denied black tarry stool. Denied bright red blood per rectum. Denied any nausea or vomiting.  ROS : Negative for chest pain or shortness of breath   Objective: Vital signs in last 24 hours: Vitals:   09/29/17 0736 09/29/17 0802  BP: 108/60   Pulse: 68 72  Resp: 15   Temp: 98.6 F (37 C)   SpO2: 95%     Physical Exam:  Gen. Elderly.. Not in acute distress Heart. RRR Abdomen. Soft, nontender, nondistended, bowel sounds present. No peritoneal signs Neuro : A/O X 3  Lab Results: Recent Labs    09/27/17 1048 09/28/17 0429  NA 138 140  K 3.3* 3.5  CL 102 104  CO2 29 28  GLUCOSE 92 96  BUN 14 12  CREATININE 0.72 0.71  CALCIUM 8.3* 8.3*   No results for input(s): AST, ALT, ALKPHOS, BILITOT, PROT, ALBUMIN in the last 72 hours. Recent Labs    09/28/17 0429 09/29/17 0228  WBC 8.6 10.3  HGB 8.3* 8.3*  HCT 26.1* 26.4*  MCV 86.4 86.0  PLT 192 224   Recent Labs    09/29/17 0228  LABPROT 13.9  INR 1.07      Assessment/Plan: - Coffee-ground emesis and melena while on aspirin and Coumadin. EGD 03/10  showed old  blood in the stomach as well as third portion of the duodenum. No bleeding lesion was identified. - Paroxysmal atrial fibrillation. Coumadin currently on hold  Recommendations --------------------------- - Hemoglobin stable while on heparin drip. Okay to switch to oral anticoagulation from GI standpoint. - Monitor CBC. - Continue twice a day PPI for 6-8 weeks followed by once a day PPI. - Follow-up with Dr. Collene Mares after discharge in 6-8 weeks.( patient sees Dr. Collene Mares as an outpatient basis.) - GI will sign off. Call us back if needed  Otis Brace MD, [facility] 09/29/2017, 9:42 AM  Contact #  [phone]

## 2017-09-29 NOTE — Progress Notes (Signed)
ANTICOAGULATION CONSULT NOTE  Pharmacy Consult:  Heparin  Indication: atrial fibrillation  Allergies  Allergen Reactions  . Meprobamate Nausea And Vomiting    Patient Measurements: Height: 5\' 2"  (157.5 cm) Weight: 283 lb 9.6 oz (128.6 kg) IBW/kg (Calculated) : 50.1 Heparin Dosing Weight: 82  kg  Vital Signs: Temp: 97.9 F (36.6 C) (03/14 0051) Temp Source: Oral (03/14 0051) BP: 104/57 (03/14 0051) Pulse Rate: 73 (03/14 0051)  Labs: Recent Labs    09/27/17 1048  09/27/17 2055 09/28/17 0429 09/28/17 1832 09/29/17 0228  HGB  --    < > 8.5* 8.3*  --  8.3*  HCT  --    < > 26.9* 26.1*  --  26.4*  PLT  --   --  213 192  --  224  LABPROT  --   --   --   --   --  13.9  INR  --   --   --   --   --  1.07  HEPARINUNFRC  --   --   --   --  <0.10* 0.33  CREATININE 0.72  --   --  0.71  --   --    < > = values in this interval not displayed.    Estimated Creatinine Clearance: 89 mL/min (by C-G formula based on SCr of 0.71 mg/dL).  Assessment: 24 YOF on Coumadin PTA for history of Afib.  She was admitted on 09/24/17 with coffee ground emesis and melena while on aspirin and Coumadin. Coumadin held on admit and EGD on 09/25/17 showed oldblood in the stomach as well as third portion of the duodenum. No bleeding lesion was identified.  Heparin restarted yesterday per GI recommendations  3/14 AM: heparin level therapeutic x 1 after rate increase  Goal of Therapy:  Heparin level 0.3-0.7 units/ml Monitor platelets by anticoagulation protocol: Yes   Plan:  Cont heparin drip at 1200 units/hr 1200 HL  Narda Bonds, PharmD, BCPS Clinical Pharmacist Phone: [phone]

## 2017-09-30 DIAGNOSIS — E1151 Type 2 diabetes mellitus with diabetic peripheral angiopathy without gangrene: Secondary | ICD-10-CM | POA: Diagnosis not present

## 2017-09-30 DIAGNOSIS — I2510 Atherosclerotic heart disease of native coronary artery without angina pectoris: Secondary | ICD-10-CM | POA: Diagnosis not present

## 2017-09-30 DIAGNOSIS — G4733 Obstructive sleep apnea (adult) (pediatric): Secondary | ICD-10-CM | POA: Diagnosis not present

## 2017-09-30 DIAGNOSIS — M4802 Spinal stenosis, cervical region: Secondary | ICD-10-CM | POA: Diagnosis not present

## 2017-09-30 DIAGNOSIS — I10 Essential (primary) hypertension: Secondary | ICD-10-CM | POA: Diagnosis not present

## 2017-09-30 DIAGNOSIS — I480 Paroxysmal atrial fibrillation: Secondary | ICD-10-CM | POA: Diagnosis not present

## 2017-09-30 LAB — CULTURE, BLOOD (ROUTINE X 2)
CULTURE: NO GROWTH
SPECIAL REQUESTS: ADEQUATE

## 2017-09-30 NOTE — Telephone Encounter (Signed)
Follow Up   Shelly at [facility] care is calling to check on some orders that need to be signed. Please call Orders: PT INR 10/30,  3 Intrum orders dating 10/25, 11/7, and 11/10

## 2017-10-02 NOTE — Progress Notes (Signed)
Cardiology Office Note   Date:  10/03/2017   ID:  Michele [name], DOB 07-Dec-1950, MRN [id number]  PCP:  [name], MD  Cardiologist:   Chief Complaint  Patient presents with  . Hospitalization Follow-up     History of Present Illness: Michele [name] is a 67 y.o. female who presents for posthospitalization follow-up, with known history of coronary artery disease, stent to the proximal RCA in 2008, diabetes type 2, hypertension, sleep apnea, paroxysmal atrial fibrillation, hyperlipidemia, sleep apnea, and arthritis.  She was admitted for GI bleed with hematemesis, blood loss anemia with a hemoglobin of 4.3 at the time of admission, FOBT positive with an INR of 2.9.  She was transfused 4 units of packed red blood cells and INR was reversed with vitamin K.  Source of bleeding was undetermined by EGD which did show gastritis and blood in the stomach.  There was no active bleeding.  The patient was transitioned to oral Coumadin.  Due to hypotension, Lopressor was the only medication continued for rate control in atrial fibrillation, prior she was on HCTZ, Cardizem, Imdur, which were held on discharge.  Is here today with caregiver with multiple questions concerning her medications. She is oxygen dependent, 4 L via nasal cannula. She is currently undergoing physical therapy at home. Energy level is slowly returning, she denies chest pain dizziness recurrent bleeding or worsening dyspnea.  She is seen today by Tommy Medal for resumption of Coumadin therapy.   Past Medical History:  Diagnosis Date  . Allergic rhinitis   . ARF (acute renal failure) ([facility]) 03/2017  . 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 Asthma   . CAD (coronary artery disease)   . CAD (coronary artery disease) 01/30/2007   stents trivial coronary artery disease diffusely, with a recent deployment od a intracoronary artery stent, 3.5 x 12 mm driver stent with no more than 20-30% in- stents restenosis.done by  Dr [name] with re-look on novenber 10 2008 revealing a widely patent stent with otherwise trival CAD and normal LV function  . Carpal tunnel syndrome, right   . Chest pain 07/17/12009   2 D Echo EF >55%  . Complication of anesthesia    slow to wake up  . Depression   . Diabetes mellitus type II   . Gait instability   . Hyperlipidemia   . Hypertension   . Iron deficiency anemia 07/13/2016  . Obesity   . Paroxysmal A-fib ([place])   . PTSD (post-traumatic stress disorder)   . Sleep apnea     Past Surgical History:  Procedure Laterality Date  . CHOLECYSTECTOMY    . ESOPHAGOGASTRODUODENOSCOPY N/A 09/25/2017   Procedure: ESOPHAGOGASTRODUODENOSCOPY (EGD);  Surgeon: Clarene Essex, MD;  Location: [facility];  Service: Endoscopy;  Laterality: N/A;  . GALLBLADDER SURGERY    . HEMORRHOID SURGERY    . Kidney stones    . Left groin cyst    . multilevel lumbar degenerative disc disease    . stress myocardial perfusion study  09/08/2010   normal; EF 75%     Current Outpatient Medications  Medication Sig Dispense Refill  . ADVAIR DISKUS 250-50 MCG/DOSE AEPB INHALE ONE PUFF TWICE DAILY 60 each 5  . albuterol (VENTOLIN HFA) 108 (90 Base) MCG/ACT inhaler Inhale 1 puff into the lungs every 6 (six) hours as needed for wheezing or shortness of breath. 18 g 4  . ARIPiprazole (ABILIFY) 5 MG tablet Take 1 tablet (5 mg total) by mouth daily. [place]  tablet 0  . benztropine (COGENTIN) 0.5 MG tablet Take 1 tablet (0.5 mg total) by mouth at bedtime. 90 tablet 0  . gabapentin (NEURONTIN) 300 MG capsule Take 300 mg by mouth at bedtime.  0  . isosorbide mononitrate (IMDUR) 30 MG 24 hr tablet Take 1 tablet (30 mg total) by mouth daily. HOLD UNTIL FOLLOW-UP WITH Dr. Gwenlyn Found 30 tablet 11  . metFORMIN (GLUCOPHAGE-XR) 500 MG 24 hr tablet TAKE ONE (1) TABLET BY MOUTH EVERY DAY WITH BREAKFAST 90 tablet 3  . metoprolol tartrate (LOPRESSOR) 25 MG tablet Take 1 tablet (25 mg total) by mouth 2 (two) times daily. 180 tablet 1   . mometasone (NASONEX) 50 MCG/ACT nasal spray Place 2 sprays into the nose daily. 17 g 2  . nitroGLYCERIN (NITROSTAT) 0.4 MG SL tablet PLACE 1 TABLET UNDER THE TONGUE EVERY 5 MINUTES AS NEEDED FOR CHEST PAIN.  MAY REPEAT 3 TIMES.  IF NO RELIEF CALL DOCTOR 25 tablet 11  . OXYGEN Inhale 2 L into the lungs continuous.     . pantoprazole (PROTONIX) 40 MG tablet Take 1 tablet (40 mg total) by mouth 2 (two) times daily before a meal. Take twice a day before meals for 8 weeks, then once a day 60 tablet 4  . PRESCRIPTION MEDICATION Inhale into the lungs at bedtime. CPAP    . rosuvastatin (CRESTOR) 10 MG tablet TAKE ONE (1) TABLET BY MOUTH EVERY DAY (Patient taking differently: TAKE ONE (1) TABLET (10 MG)  BY MOUTH EVERY DAY) 90 tablet 1  . sertraline (ZOLOFT) 100 MG tablet TAKE TWO (2) TABLETS BY MOUTH DAILY 180 tablet 0  . warfarin (COUMADIN) 7.5 MG tablet TAKE ONE AND ONE-HALF TO TWO TABLETS BY MOUTH DAILY AS DIRECTED (Patient taking differently: Take 11.6m (1 and 1/2 tablets) daily except take 7.536mon every Monday and Friday.) 150 tablet 0   No current facility-administered medications for this visit.     Allergies:   Meprobamate    Social History:  The patient  reports that  has never smoked. she has never used smokeless tobacco. She reports that she does not drink alcohol or use drugs.   Family History:  The patient's family history includes Coronary artery disease in her father and mother; Depression in her father.    ROS: All other systems are reviewed and negative. Unless otherwise mentioned in H&P    PHYSICAL EXAM: VS:  BP 123/76   Pulse 74   Ht _0  (1.575 m)   Wt 276 lb 9.6 oz (125.5 kg)   SpO2 99%   BMI 50.59 kg/m  , BMI Body mass index is 50.59 kg/m. GEN: Well nourished, well developed, in no acute distress Obese, wearing oxygen via nasal cannula  HEENT: normal  Neck: no JVD, carotid bruits, or masses Cardiac: RRR; no murmurs, rubs, or gallops,no edema  Respiratory:   Clear to auscultation bilaterally, normal work of breathing GI: soft, nontender, nondistended, + BS MS: no deformity or atrophy  Skin: warm and dry, no rash Neuro:  Strength is diminished and sensation are intact Psych: euthymic mood, full affect,, having issues with memory   Recent Labs: 04/12/2017: Magnesium 2.0; TSH 1.734 09/24/2017: ALT 17; B Natriuretic Peptide 16.7 09/28/2017: BUN 12; Creatinine, Ser 0.71; Potassium 3.5; Sodium 140 09/29/2017: Hemoglobin 8.3; Platelets 224    Lipid Panel    Component Value Date/Time   CHOL 103 09/25/2017 1007   TRIG 160 (H) 09/25/2017 1007   HDL 31 (L) 09/25/2017 1007   CHOLHDL  3.3 09/25/2017 1007   VLDL 32 09/25/2017 1007   LDLCALC 40 09/25/2017 1007      Wt Readings from Last 3 Encounters:  10/03/17 276 lb 9.6 oz (125.5 kg)  09/29/17 289 lb 0.4 oz (131.1 kg)  07/27/17 288 lb 14.4 oz (131 kg)      Other studies Reviewed: Echocardiogram  09/25/2017 Left ventricle: The cavity size was normal. Wall thickness was   increased in a pattern of mild LVH. Systolic function was   vigorous. The estimated ejection fraction was in the range of 65%   to 70%. Wall motion was normal; there were no regional wall   motion abnormalities. Doppler parameters are consistent with   abnormal left ventricular relaxation (grade 1 diastolic   dysfunction). - Aortic valve: Poorly visualized. Probably trileaflet. There was   no stenosis. - Mitral valve: There was no significant regurgitation. - Right ventricle: The cavity size was normal. Systolic function   was normal. - Pulmonary arteries: No complete TR doppler jet so unable to   estimate PA systolic pressure. - Inferior vena cava: The vessel was normal in size. The   respirophasic diameter changes were in the normal range (>= 50%),   consistent with normal central venous pressure. - Pericardium, extracardiac: A trivial pericardial effusion was   identified.  Impressions:  - Normal LV size with  mild LV hypertrophy. EF 65-70%, vigorous   systolic function. Normal RV size and systolic function. No   significant valvular abnormalities.  ASSESSMENT AND PLAN:  1. Acute blood loss anemia: Status post hospitalization requiring 4 units of packed blood cells, EGD revealing blood in the stomach but no active bleeding. She follows up with Dr.Mann on 10/11/2017  2. Atrial fibrillation: Met with pharmacist, resumption of Coumadin today and PT/INR check. Heart rate is controlled with Lopressor 25 mg twice a day.  3. Hypotension: Noted during hospitalization during profound anemia. She was taken off of HCTZ, diltiazem, isosorbide mononitrate, blood pressure and heart rate are well controlled will not restart any of these medications. I have warned her that we may start her back on one of the antihypertensives should blood pressure rises she becomes more active.  4. CAD: History of stent to the right coronary artery 2008. She denies any cardiac symptoms at this time. Current medicines are reviewed at length with the patient today.    Labs/ tests ordered today include:  Phill Myron. [place] Pugh, ANP, AACC   10/03/2017 12:47 PM    [facility] [address] [place], [place] [address] Phone: [phone]; Fax: [phone]

## 2017-10-03 ENCOUNTER — Ambulatory Visit (INDEPENDENT_AMBULATORY_CARE_PROVIDER_SITE_OTHER): Payer: Medicare Other | Admitting: Adult Health

## 2017-10-03 ENCOUNTER — Ambulatory Visit (INDEPENDENT_AMBULATORY_CARE_PROVIDER_SITE_OTHER): Payer: Medicare Other | Admitting: Pharmacist Clinician (PhC)/ Clinical Pharmacy Specialist

## 2017-10-03 ENCOUNTER — Encounter: Payer: Self-pay | Admitting: Adult Health

## 2017-10-03 VITALS — BP 123/76 | HR 74 | Ht 62.0 in | Wt 276.6 lb

## 2017-10-03 DIAGNOSIS — I480 Paroxysmal atrial fibrillation: Secondary | ICD-10-CM

## 2017-10-03 DIAGNOSIS — I10 Essential (primary) hypertension: Secondary | ICD-10-CM

## 2017-10-03 DIAGNOSIS — Z7901 Long term (current) use of anticoagulants: Secondary | ICD-10-CM

## 2017-10-03 DIAGNOSIS — I2510 Atherosclerotic heart disease of native coronary artery without angina pectoris: Secondary | ICD-10-CM | POA: Diagnosis not present

## 2017-10-03 DIAGNOSIS — D500 Iron deficiency anemia secondary to blood loss (chronic): Secondary | ICD-10-CM | POA: Diagnosis not present

## 2017-10-03 LAB — POCT INR: INR: 1.1

## 2017-10-03 MED ORDER — METOPROLOL TARTRATE 25 MG PO TABS: 25.0000 mg | ORAL_TABLET | Freq: Two times a day (BID) | ORAL | 1 refills | Status: DC

## 2017-10-03 NOTE — Patient Instructions (Signed)
Description   Take 1.5 tablets today Monday March 18, then continue with 1 tablet each Monday and Friday, 1.5 tablets all other days.  Repeat INR Thursday or Friday.    Have Home Health call us for orders Marieta Markov/Raquel at [phone]

## 2017-10-03 NOTE — Patient Instructions (Addendum)
Medication Instructions:  TAKE METOPROLOL TARTRATE 25MG  TWICE DAILY  If you need a refill on your cardiac medications before your next appointment, please call your pharmacy.  Special Instructions: MAKE SURE TO CALL us IF BP IS >140/90  Follow-Up: Your physician wants you to follow-up in: 3 MONTHS WITH DR Gwenlyn Found.   Thank you for choosing [facility] at [facility]!!

## 2017-10-04 ENCOUNTER — Ambulatory Visit (HOSPITAL_COMMUNITY): Payer: Self-pay | Admitting: Licensed Clinical Social Worker

## 2017-10-04 ENCOUNTER — Other Ambulatory Visit: Payer: Self-pay | Admitting: Cardiovascular Disease

## 2017-10-04 ENCOUNTER — Telehealth: Payer: Self-pay | Admitting: Adult Health

## 2017-10-04 MED ORDER — METOPROLOL TARTRATE 25 MG PO TABS: 25.0000 mg | ORAL_TABLET | Freq: Two times a day (BID) | ORAL | 0 refills | Status: DC

## 2017-10-04 NOTE — Telephone Encounter (Signed)
Spoke with patients daugher, Lavonia Drafts, Per Dpr, and informed her that I would send a 2 wk supply to [facility], the local pharmacy, while patient waits for her mail order rx to be shipped. She thanked me and voiced understanding.

## 2017-10-04 NOTE — Telephone Encounter (Signed)
Per chart review, patient has Rx for metoprolol tartrate 25mg twice daily (from DNP note 10/03/17: 2. Atrial fibrillation: Met with pharmacist, resumption of Coumadin today and PT/INR check. Heart rate is controlled with Lopressor 25 mg twice a day.) AVS from 10/03/17 states take metoprolol tartrate 50mg twice daily.   Routed to M. Broome to clarify  

## 2017-10-04 NOTE — Telephone Encounter (Signed)
New Message   Pt c/o medication issue:  1. Name of Medication: metoprolol tartrate (LOPRESSOR) 25 MG tablet  2. How are you currently taking this medication (dosage and times per day)? Take 1 tablet (25 mg total) by mouth 2 (two) times daily  3. Are you having a reaction (difficulty breathing--STAT)? no  4. What is your medication issue? Sam at [facility] is needing further clarification on instructions. Please call

## 2017-10-04 NOTE — Telephone Encounter (Signed)
Discharge summary has her on metoprolol 25 mg BID. I did not increase this during last appointment

## 2017-10-04 NOTE — Telephone Encounter (Signed)
Only place it says 50 mg bid is in the patient instructions.  This is probably just a typo.  Looks like everything else says 25 mg bid.  Go with the 25.

## 2017-10-04 NOTE — Telephone Encounter (Signed)
°*  STAT* If patient is at the pharmacy, call can be transferred to refill team.   1. Which medications need to be refilled? (please list name of each medication and dose if known) Needs a prescrription called to local pharmacy until her mail order comes in for her Metoprolol  2. Which pharmacy/location (including street and city if local pharmacy) is medication to be sent to?[facility] [phone]  3. Do they need a 30 day or 90 day supply? [place]

## 2017-10-05 NOTE — Telephone Encounter (Signed)
Returned call to pill pack there were calling to verify that we d/c isosorbide, hctz, irbesartan and diltiazem, this was d/c by KL at appt, verified that metoprolol tart dose is 28m BID

## 2017-10-05 NOTE — Telephone Encounter (Signed)
(  DPR) Starling Manns, daughter NOTIFIED OF METOPROLOL TART 25MG BID DOSING-INFORMED WE ALREADY NOTIFIED [place].

## 2017-10-06 ENCOUNTER — Ambulatory Visit (INDEPENDENT_AMBULATORY_CARE_PROVIDER_SITE_OTHER): Payer: Medicare Other | Admitting: Pharmacist

## 2017-10-06 ENCOUNTER — Telehealth: Payer: Self-pay | Admitting: Adult Health

## 2017-10-06 DIAGNOSIS — I480 Paroxysmal atrial fibrillation: Secondary | ICD-10-CM

## 2017-10-06 DIAGNOSIS — I10 Essential (primary) hypertension: Secondary | ICD-10-CM | POA: Diagnosis not present

## 2017-10-06 DIAGNOSIS — Z7901 Long term (current) use of anticoagulants: Secondary | ICD-10-CM | POA: Diagnosis not present

## 2017-10-06 DIAGNOSIS — Z09 Encounter for follow-up examination after completed treatment for conditions other than malignant neoplasm: Secondary | ICD-10-CM | POA: Diagnosis not present

## 2017-10-06 DIAGNOSIS — K922 Gastrointestinal hemorrhage, unspecified: Secondary | ICD-10-CM | POA: Diagnosis not present

## 2017-10-06 DIAGNOSIS — R296 Repeated falls: Secondary | ICD-10-CM | POA: Diagnosis not present

## 2017-10-06 DIAGNOSIS — R229 Localized swelling, mass and lump, unspecified: Secondary | ICD-10-CM | POA: Diagnosis not present

## 2017-10-06 LAB — POCT INR: INR: 1.6

## 2017-10-06 NOTE — Telephone Encounter (Signed)
Spoke to [place] NP - she had questio  About patient last office visit 10/03/17. If any of patient medication were restarted, patient has some swelling on right side. ( hctz , isosorbide, diltiazem,irbesatran).  Informed  that metoprolol tartrate 25 mg twice a day was started.  [name] states will start hctz, and call on Monday to speak to Dr Purcell Nails DNP concerning  .

## 2017-10-07 NOTE — Telephone Encounter (Signed)
Ok. Didn't start the HCTZ due to hypotension in the hospital and normal  BP on follow up. Will follow

## 2017-10-08 ENCOUNTER — Other Ambulatory Visit: Payer: Self-pay | Admitting: Cardiovascular Disease

## 2017-10-10 ENCOUNTER — Ambulatory Visit (INDEPENDENT_AMBULATORY_CARE_PROVIDER_SITE_OTHER): Payer: Medicare Other | Admitting: Pharmacist Clinician (PhC)/ Clinical Pharmacy Specialist

## 2017-10-10 DIAGNOSIS — I480 Paroxysmal atrial fibrillation: Secondary | ICD-10-CM | POA: Diagnosis not present

## 2017-10-10 DIAGNOSIS — Z7901 Long term (current) use of anticoagulants: Secondary | ICD-10-CM | POA: Diagnosis not present

## 2017-10-10 LAB — POCT INR: INR: 1.1

## 2017-10-11 ENCOUNTER — Telehealth: Payer: Self-pay | Admitting: *Deleted

## 2017-10-11 ENCOUNTER — Other Ambulatory Visit: Payer: Self-pay | Admitting: Gastroenterology

## 2017-10-11 DIAGNOSIS — K625 Hemorrhage of anus and rectum: Secondary | ICD-10-CM | POA: Diagnosis not present

## 2017-10-11 DIAGNOSIS — K5900 Constipation, unspecified: Secondary | ICD-10-CM | POA: Diagnosis not present

## 2017-10-11 DIAGNOSIS — K219 Gastro-esophageal reflux disease without esophagitis: Secondary | ICD-10-CM | POA: Diagnosis not present

## 2017-10-11 DIAGNOSIS — Z1211 Encounter for screening for malignant neoplasm of colon: Secondary | ICD-10-CM | POA: Diagnosis not present

## 2017-10-11 NOTE — Telephone Encounter (Signed)
    [facility] Pre-operative Risk Assessment    Request for surgical clearance:  1. What type of surgery is being performed? COLONOSCOPY    2. When is this surgery scheduled? 11/18/17    3. What type of clearance is required (medical clearance vs. Pharmacy clearance to hold med vs. Both)? BOTH  4. Are there any medications that need to be held prior to surgery and how long?WARFARIN   5. Practice name and name of physician performing surgery? [facility]  6. What is your office phone and fax number? P [phone] F [phone]    7. Anesthesia type (None, local, MAC, general) ? PROPOFOL

## 2017-10-13 NOTE — Telephone Encounter (Signed)
Will send to pharm first

## 2017-10-14 NOTE — Telephone Encounter (Signed)
Pt takes warfarin for afib with CHADS2VASc score of 7 (age, sex, HTN, CAD, stroke, DM). Patient will require bridging with Lovenox injections in order to hold her warfarin for 5 days prior to colonoscopy due to history of afib with stroke. This will be coordinated in the [facility].

## 2017-10-17 ENCOUNTER — Ambulatory Visit (INDEPENDENT_AMBULATORY_CARE_PROVIDER_SITE_OTHER): Payer: Medicare Other | Admitting: Pharmacist

## 2017-10-17 DIAGNOSIS — Z7901 Long term (current) use of anticoagulants: Secondary | ICD-10-CM

## 2017-10-17 DIAGNOSIS — I480 Paroxysmal atrial fibrillation: Secondary | ICD-10-CM

## 2017-10-17 LAB — POCT INR: INR: 2.4

## 2017-10-17 NOTE — Telephone Encounter (Signed)
   Primary Cardiologist: Quay Burow, MD  Chart reviewed as part of pre-operative protocol coverage. Given past medical history and time since last visit, based on ACC/AHA guidelines, Michele [name] would be at acceptable risk for the planned procedure without further cardiovascular testing.   Please call with questions.  I have talked to her daughter who is her caretaker, there has been no change in symptom.  Patient is cleared from medical perspective. Will send to coumadin clinic to arrange coumadin clinic visit for INR check prior to the procedure. Note per daughter, INR obtained by home health nurse today was 2.4, and result will be sent to clinical pharmacist.   Almyra Deforest, PA 10/17/2017, 4:12 PM

## 2017-10-18 ENCOUNTER — Ambulatory Visit (HOSPITAL_COMMUNITY): Payer: Self-pay | Admitting: Licensed Clinical Social Worker

## 2017-10-18 NOTE — Telephone Encounter (Signed)
INR checks done with Home Health for now.   Need for bridging therapy already discussed with home health nurse (see anticoagulation note from 10/17/17) and will provide dosing instructions closer to procedure.  Home Health nurse will provide training to family members for enoxaparin administration.

## 2017-10-21 ENCOUNTER — Telehealth: Payer: Self-pay | Admitting: Cardiovascular Disease

## 2017-10-21 NOTE — Telephone Encounter (Signed)
Pharm D recommendations sent to Dr Minerva Areola office.

## 2017-10-21 NOTE — Telephone Encounter (Signed)
New Message    Lollie Marrow from [facility] is calling in reference to several orders that they have faxed over for Dr. Gwenlyn Found signature. These orders are from December going forward. Please call to discuss.

## 2017-10-21 NOTE — Telephone Encounter (Signed)
Routed to T. Stover CMA concerning orders

## 2017-10-24 ENCOUNTER — Ambulatory Visit (INDEPENDENT_AMBULATORY_CARE_PROVIDER_SITE_OTHER): Payer: Medicare Other | Admitting: Pharmacist Clinician (PhC)/ Clinical Pharmacy Specialist

## 2017-10-24 DIAGNOSIS — Z7901 Long term (current) use of anticoagulants: Secondary | ICD-10-CM

## 2017-10-24 DIAGNOSIS — I480 Paroxysmal atrial fibrillation: Secondary | ICD-10-CM

## 2017-10-24 LAB — POCT INR: INR: 2.8

## 2017-10-24 NOTE — Telephone Encounter (Signed)
All orders signed and faxed.

## 2017-10-25 ENCOUNTER — Inpatient Hospital Stay: Payer: Medicare Other

## 2017-10-25 ENCOUNTER — Encounter: Payer: Self-pay | Admitting: Internal Medicine

## 2017-10-25 ENCOUNTER — Inpatient Hospital Stay: Payer: Medicare Other | Attending: Internal Medicine | Admitting: Internal Medicine

## 2017-10-25 VITALS — BP 127/69 | HR 58 | Temp 97.7°F | Resp 16 | Ht 62.0 in | Wt 280.6 lb

## 2017-10-25 DIAGNOSIS — K2970 Gastritis, unspecified, without bleeding: Secondary | ICD-10-CM | POA: Insufficient documentation

## 2017-10-25 DIAGNOSIS — D508 Other iron deficiency anemias: Secondary | ICD-10-CM

## 2017-10-25 DIAGNOSIS — E119 Type 2 diabetes mellitus without complications: Secondary | ICD-10-CM | POA: Insufficient documentation

## 2017-10-25 DIAGNOSIS — D500 Iron deficiency anemia secondary to blood loss (chronic): Secondary | ICD-10-CM | POA: Insufficient documentation

## 2017-10-25 DIAGNOSIS — I10 Essential (primary) hypertension: Secondary | ICD-10-CM | POA: Diagnosis not present

## 2017-10-25 LAB — CBC WITH DIFFERENTIAL (CANCER CENTER ONLY)
BASOS ABS: 0.1 10*3/uL (ref 0.0–0.1)
Basophils Relative: 1 %
Eosinophils Absolute: 0.4 10*3/uL (ref 0.0–0.5)
Eosinophils Relative: 6 %
HEMATOCRIT: 35.8 % (ref 34.8–46.6)
Hemoglobin: 11 g/dL — ABNORMAL LOW (ref 11.6–15.9)
LYMPHS PCT: 24 %
Lymphs Abs: 1.7 10*3/uL (ref 0.9–3.3)
MCH: 24.3 pg — ABNORMAL LOW (ref 25.1–34.0)
MCHC: 30.7 g/dL — ABNORMAL LOW (ref 31.5–36.0)
MCV: 79 fL — AB (ref 79.5–101.0)
MONO ABS: 0.7 10*3/uL (ref 0.1–0.9)
Monocytes Relative: 10 %
NEUTROS ABS: 4.3 10*3/uL (ref 1.5–6.5)
Neutrophils Relative %: 59 %
Platelet Count: 323 10*3/uL (ref 145–400)
RBC: 4.53 MIL/uL (ref 3.70–5.45)
RDW: 17.9 % — AB (ref 11.2–14.5)
WBC Count: 7.2 10*3/uL (ref 3.9–10.3)

## 2017-10-25 LAB — IRON AND TIBC
Iron: 51 ug/dL (ref 41–142)
Saturation Ratios: 20 % — ABNORMAL LOW (ref 21–57)
TIBC: 254 ug/dL (ref 236–444)
UIBC: 203 ug/dL

## 2017-10-25 LAB — FERRITIN: FERRITIN: 399 ng/mL — AB (ref 9–269)

## 2017-10-25 NOTE — Progress Notes (Signed)
[place] [facility] [phone]   Fax:([phone]  OFFICE PROGRESS NOTE  [name], [place] [address] [place] [address]  DIAGNOSIS: Unspecified anemia questionable for anemia of chronic disease/iron deficiency.   PRIOR THERAPY: Status post Feraheme infusion 510 mg IV weekly on as-needed basis.  Last dose was given August 12, 2017.  CURRENT THERAPY: None  INTERVAL HISTORY: Michele [name] 67 y.o. female follow-up visit accompanied by her daughter.  The patient is feeling fine today with no specific complaints except for fatigue.  She was admitted to the hospital few weeks ago with significant fatigue and weakness.  She was found to have acute blood loss anemia with gastrointestinal bleeding.  She underwent upper endoscopy under the care of Dr. Watt Climes.  It showed gastritis with clotted blood in the greater curvature of the stomach.  She is scheduled to see Dr. Collene Mares for reevaluation.  She received 3 units of PRBCs transfusion during her hospitalization.  The patient denied having any current chest pain, shortness of breath except with exertion with no cough or hemoptysis.  She denied having any recent weight loss or night sweats.  She has no nausea, vomiting, diarrhea or constipation.  She is here today for evaluation and repeat blood work.  MEDICAL HISTORY: Past Medical History:  Diagnosis Date  . Allergic rhinitis   . ARF (acute renal failure) ([facility]) 03/2017  . 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 Asthma   . CAD (coronary artery disease)   . CAD (coronary artery disease) 01/30/2007   stents trivial coronary artery disease diffusely, with a recent deployment od a intracoronary artery stent, 3.5 x 12 mm driver stent with no more than 20-30% in- stents restenosis.done by Dr [name] with re-look on novenber 10 2008 revealing a widely patent stent with otherwise trival CAD and normal LV function  . Carpal tunnel syndrome, right   .  Chest pain 07/17/12009   2 D Echo EF >55%  . Complication of anesthesia    slow to wake up  . Depression   . Diabetes mellitus type II   . Gait instability   . Hyperlipidemia   . Hypertension   . Iron deficiency anemia 07/13/2016  . Obesity   . Paroxysmal A-fib ([place])   . PTSD (post-traumatic stress disorder)   . Sleep apnea     ALLERGIES:  is allergic to meprobamate.  MEDICATIONS:  Current Outpatient Medications  Medication Sig Dispense Refill  . ADVAIR DISKUS 250-50 MCG/DOSE AEPB INHALE ONE PUFF TWICE DAILY 60 each 5  . albuterol (VENTOLIN HFA) 108 (90 Base) MCG/ACT inhaler Inhale 1 puff into the lungs every 6 (six) hours as needed for wheezing or shortness of breath. 18 g 4  . ARIPiprazole (ABILIFY) 5 MG tablet Take 1 tablet (5 mg total) by mouth daily. 90 tablet 0  . gabapentin (NEURONTIN) 300 MG capsule Take 300 mg by mouth at bedtime.  0  . metFORMIN (GLUCOPHAGE-XR) 500 MG 24 hr tablet TAKE ONE (1) TABLET BY MOUTH EVERY DAY WITH BREAKFAST 90 tablet 3  . metoprolol tartrate (LOPRESSOR) 25 MG tablet Take 1 tablet (25 mg total) by mouth 2 (two) times daily. 15 tablet 0  . mometasone (NASONEX) 50 MCG/ACT nasal spray Place 2 sprays into the nose daily. 17 g 2  . nitroGLYCERIN (NITROSTAT) 0.4 MG SL tablet PLACE 1 TABLET UNDER THE TONGUE EVERY 5 MINUTES AS NEEDED FOR CHEST PAIN.  MAY REPEAT 3  TIMES.  IF NO RELIEF CALL DOCTOR 25 tablet 11  . OXYGEN Inhale 2 L into the lungs continuous.     . pantoprazole (PROTONIX) 40 MG tablet Take 1 tablet (40 mg total) by mouth 2 (two) times daily before a meal. Take twice a day before meals for 8 weeks, then once a day 60 tablet 4  . PRESCRIPTION MEDICATION Inhale into the lungs at bedtime. CPAP    . rosuvastatin (CRESTOR) 10 MG tablet TAKE ONE (1) TABLET BY MOUTH EVERY DAY (Patient taking differently: TAKE ONE (1) TABLET (10 MG)  BY MOUTH EVERY DAY) 90 tablet 1  . sertraline (ZOLOFT) 100 MG tablet TAKE TWO (2) TABLETS BY MOUTH DAILY 180 tablet 0    . warfarin (COUMADIN) 7.5 MG tablet TAKE ONE AND ONE-HALF TO TWO TABLETS BY MOUTH DAILY AS DIRECTED (Patient taking differently: Take 11.25mg  (1 and 1/2 tablets) daily except take 7.5mg  on every Monday and Friday.) 150 tablet 0   No current facility-administered medications for this visit.     SURGICAL HISTORY:  Past Surgical History:  Procedure Laterality Date  . CHOLECYSTECTOMY    . ESOPHAGOGASTRODUODENOSCOPY N/A 09/25/2017   Procedure: ESOPHAGOGASTRODUODENOSCOPY (EGD);  Surgeon: Clarene Essex, MD;  Location: [facility];  Service: Endoscopy;  Laterality: N/A;  . GALLBLADDER SURGERY    . HEMORRHOID SURGERY    . Kidney stones    . Left groin cyst    . multilevel lumbar degenerative disc disease    . stress myocardial perfusion study  09/08/2010   normal; EF 75%    REVIEW OF SYSTEMS:  A comprehensive review of systems was negative except for: Constitutional: positive for fatigue   PHYSICAL EXAMINATION: General appearance: alert, cooperative, fatigued and no distress Head: Normocephalic, without obvious abnormality, atraumatic Neck: no adenopathy, no JVD, supple, symmetrical, trachea midline and thyroid not enlarged, symmetric, no tenderness/mass/nodules Lymph nodes: Cervical, supraclavicular, and axillary nodes normal. Resp: clear to auscultation bilaterally Back: symmetric, no curvature. ROM normal. No CVA tenderness. Cardio: regular rate and rhythm, S1, S2 normal, no murmur, click, rub or gallop GI: soft, non-tender; bowel sounds normal; no masses,  no organomegaly Extremities: extremities normal, atraumatic, no cyanosis or edema  ECOG PERFORMANCE STATUS: 1 - Symptomatic but completely ambulatory  Blood pressure 127/69, pulse (!) 58, temperature 97.7 F (36.5 C), temperature source Oral, resp. rate 16, height 5\' 2"  (1.575 m), weight 280 lb 9.6 oz (127.3 kg), SpO2 95 %.  LABORATORY DATA: Lab Results  Component Value Date   WBC 7.2 10/25/2017   HGB 8.3 (L) 09/29/2017   HCT  35.8 10/25/2017   MCV 79.0 (L) 10/25/2017   PLT 323 10/25/2017      Chemistry      Component Value Date/Time   NA 140 09/28/2017 0429   NA 144 10/02/2012 0905   K 3.5 09/28/2017 0429   K 3.4 (L) 10/02/2012 0905   CL 104 09/28/2017 0429   CL 101 10/02/2012 0905   CO2 28 09/28/2017 0429   CO2 31 (H) 10/02/2012 0905   BUN 12 09/28/2017 0429   BUN 13.9 10/02/2012 0905   CREATININE 0.71 09/28/2017 0429   CREATININE 0.77 12/08/2014 1153   CREATININE 0.8 10/02/2012 0905      Component Value Date/Time   CALCIUM 8.3 (L) 09/28/2017 0429   CALCIUM 9.9 10/02/2012 0905   ALKPHOS 61 09/24/2017 2009   AST 28 09/24/2017 2009   ALT 17 09/24/2017 2009   BILITOT 0.5 09/24/2017 2009     Iron study and  ferritin are still pending.  RADIOGRAPHIC STUDIES: No results found.  ASSESSMENT AND PLAN:  This is a very pleasant 67 years old African-American female with iron deficiency anemia plus/minus anemia of chronic disease status post Feraheme infusion. Her last Feraheme infusion was given on August 12, 2017.  The patient had acute blood loss with severe anemia and she received 3 units of PRBCs transfusion in March 2019. CBC today showed hemoglobin of 11.0 and hematocrit 35.8%. Iron study and ferritin are still pending.  I recommended for the patient to continue on observation for now with repeat CBC, iron study and ferritin in 2 months.  If the pending iron study showed concerning iron deficiency, I would consider the patient for further heme infusion in the next 2 weeks. She was advised to call immediately if she has any concerning symptoms in the interval. The patient voices understanding of current disease status and treatment options and is in agreement with the current care plan. All questions were answered. The patient knows to call the clinic with any problems, questions or concerns. We can certainly see the patient much sooner if necessary. I spent 10 minutes counseling the patient face to  face. The total time spent in the appointment was 15 minutes.  Disclaimer: This note was dictated with voice recognition software. Similar sounding words can inadvertently be transcribed and may not be corrected upon review.

## 2017-10-31 ENCOUNTER — Ambulatory Visit (INDEPENDENT_AMBULATORY_CARE_PROVIDER_SITE_OTHER): Payer: Medicare Other | Admitting: Pharmacist Clinician (PhC)/ Clinical Pharmacy Specialist

## 2017-10-31 DIAGNOSIS — Z7901 Long term (current) use of anticoagulants: Secondary | ICD-10-CM | POA: Diagnosis not present

## 2017-10-31 DIAGNOSIS — I480 Paroxysmal atrial fibrillation: Secondary | ICD-10-CM

## 2017-10-31 LAB — POCT INR: INR: 3.5

## 2017-11-01 ENCOUNTER — Ambulatory Visit (HOSPITAL_COMMUNITY): Payer: Self-pay | Admitting: Licensed Clinical Social Worker

## 2017-11-07 ENCOUNTER — Ambulatory Visit (INDEPENDENT_AMBULATORY_CARE_PROVIDER_SITE_OTHER): Payer: Medicare Other | Admitting: Pharmacist

## 2017-11-07 ENCOUNTER — Other Ambulatory Visit (HOSPITAL_COMMUNITY): Payer: Self-pay | Admitting: Psychiatry

## 2017-11-07 ENCOUNTER — Telehealth: Payer: Self-pay | Admitting: Cardiovascular Disease

## 2017-11-07 DIAGNOSIS — I480 Paroxysmal atrial fibrillation: Secondary | ICD-10-CM

## 2017-11-07 DIAGNOSIS — Z7901 Long term (current) use of anticoagulants: Secondary | ICD-10-CM | POA: Diagnosis not present

## 2017-11-07 DIAGNOSIS — F331 Major depressive disorder, recurrent, moderate: Secondary | ICD-10-CM

## 2017-11-07 LAB — POCT INR: INR: 3.7

## 2017-11-07 MED ORDER — ENOXAPARIN SODIUM 120 MG/0.8ML ~~LOC~~ SOLN: 120.0000 mg | Freq: Two times a day (BID) | SUBCUTANEOUS | 0 refills | Status: DC

## 2017-11-07 NOTE — Patient Instructions (Addendum)
11/12/17: Last dose of Coumadin.  11/13/17: No Coumadin or Lovenox.  11/14/17: Inject Lovenox 120mg  in the fatty abdominal tissue at least 2 inches from the belly button twice a day about 12 hours apart, 8am and 8pm rotate sites. No Coumadin.  11/15/17: Inject Lovenox in the fatty tissue every 12 hours, 8am and 8pm. No Coumadin.  11/16/17: Inject Lovenox in the fatty tissue every 12 hours, 8am and 8pm. No Coumadin.  11/17/17: Inject Lovenox in the fatty tissue in the morning at 8 am (No PM dose). No Coumadin.  11/18/17: Procedure Day - No Lovenox - Resume Coumadin in the evening or as directed by doctor  11/19/17: Resume Lovenox inject in the fatty tissue every 12 hours and take Coumadin 11.25mg .  11/20/17: Inject Lovenox in the fatty tissue every 12 hours and take Coumadin 11.25mg .  11/21/17: Inject Lovenox in the fatty tissue every 12 hours and take Coumadin 11.25mg .  11/22/17: Inject Lovenox in the fatty tissue every 12 hours and take Coumadin 11.25mg .  11/23/17: Inject Lovenox in the fatty tissue every 12 hours and take Coumadin 7.5mg .  11/24/17: Check INR.

## 2017-11-07 NOTE — Telephone Encounter (Signed)
Weekly communication with [facility]). They will help patient and family with bridging plan.  RN is due to visit patient today to review injection technique and dosing for bridging.   **Instructions given to caregiver Jeani Hawking) and to [place] LPN with [facility]**

## 2017-11-07 NOTE — Telephone Encounter (Signed)
New Message:     Caryl Pina is calling from [facility] due to [place] giving patient cardiac clearance. Caryl Pina states we have not called pt to schedule a Lovanox Bridge and this needs to be done before patients procedure on 5/3.

## 2017-11-10 ENCOUNTER — Telehealth: Payer: Self-pay | Admitting: Cardiovascular Disease

## 2017-11-10 NOTE — Telephone Encounter (Signed)
New Message:      Pt is calling pharmacy stating we are supposed to send them and order for enoxaparin (LOVENOX) 120 MG/0.8ML injection. Pt states she is supposed to start this medicine on Sunday due to having a colonoscopy on 5/3. Pls advise pharmacy on what to do.

## 2017-11-10 NOTE — Telephone Encounter (Signed)
1st dose needed for Monday 4/29. Rx sent on 4/22 to mail order pharmacy.    Verbal Rx provided today to local pharmacy and [facility] rx placed on HOLD.

## 2017-11-15 ENCOUNTER — Ambulatory Visit (HOSPITAL_COMMUNITY): Payer: Self-pay | Admitting: Licensed Clinical Social Worker

## 2017-11-17 ENCOUNTER — Encounter (HOSPITAL_COMMUNITY): Payer: Self-pay

## 2017-11-17 ENCOUNTER — Other Ambulatory Visit: Payer: Self-pay

## 2017-11-18 ENCOUNTER — Encounter (HOSPITAL_COMMUNITY)
Admission: RE | Disposition: A | Discharge status: Home / Self Care | Payer: Self-pay | Source: Ambulatory Visit | Attending: Gastroenterology

## 2017-11-18 ENCOUNTER — Encounter (HOSPITAL_COMMUNITY): Payer: Self-pay | Admitting: *Deleted

## 2017-11-18 ENCOUNTER — Ambulatory Visit (HOSPITAL_COMMUNITY): Payer: Medicare Other | Admitting: Anesthesiology

## 2017-11-18 ENCOUNTER — Ambulatory Visit (HOSPITAL_COMMUNITY)
Admission: RE | Admit: 2017-11-18 | Discharge: 2017-11-18 | Disposition: A | Discharge status: Home / Self Care | Payer: Medicare Other | Source: Ambulatory Visit | Attending: Gastroenterology | Admitting: Gastroenterology

## 2017-11-18 DIAGNOSIS — Z8249 Family history of ischemic heart disease and other diseases of the circulatory system: Secondary | ICD-10-CM | POA: Diagnosis not present

## 2017-11-18 DIAGNOSIS — Z9989 Dependence on other enabling machines and devices: Secondary | ICD-10-CM | POA: Insufficient documentation

## 2017-11-18 DIAGNOSIS — F329 Major depressive disorder, single episode, unspecified: Secondary | ICD-10-CM | POA: Insufficient documentation

## 2017-11-18 DIAGNOSIS — I480 Paroxysmal atrial fibrillation: Secondary | ICD-10-CM | POA: Diagnosis not present

## 2017-11-18 DIAGNOSIS — Q438 Other specified congenital malformations of intestine: Secondary | ICD-10-CM | POA: Insufficient documentation

## 2017-11-18 DIAGNOSIS — E119 Type 2 diabetes mellitus without complications: Secondary | ICD-10-CM | POA: Insufficient documentation

## 2017-11-18 DIAGNOSIS — Z87442 Personal history of urinary calculi: Secondary | ICD-10-CM | POA: Insufficient documentation

## 2017-11-18 DIAGNOSIS — I2510 Atherosclerotic heart disease of native coronary artery without angina pectoris: Secondary | ICD-10-CM | POA: Diagnosis not present

## 2017-11-18 DIAGNOSIS — F4310 Post-traumatic stress disorder, unspecified: Secondary | ICD-10-CM | POA: Diagnosis not present

## 2017-11-18 DIAGNOSIS — I10 Essential (primary) hypertension: Secondary | ICD-10-CM | POA: Insufficient documentation

## 2017-11-18 DIAGNOSIS — Z1211 Encounter for screening for malignant neoplasm of colon: Secondary | ICD-10-CM | POA: Insufficient documentation

## 2017-11-18 DIAGNOSIS — Z7982 Long term (current) use of aspirin: Secondary | ICD-10-CM | POA: Diagnosis not present

## 2017-11-18 DIAGNOSIS — E785 Hyperlipidemia, unspecified: Secondary | ICD-10-CM | POA: Diagnosis not present

## 2017-11-18 DIAGNOSIS — J45909 Unspecified asthma, uncomplicated: Secondary | ICD-10-CM | POA: Insufficient documentation

## 2017-11-18 DIAGNOSIS — Z7901 Long term (current) use of anticoagulants: Secondary | ICD-10-CM | POA: Diagnosis not present

## 2017-11-18 DIAGNOSIS — Z955 Presence of coronary angioplasty implant and graft: Secondary | ICD-10-CM | POA: Diagnosis not present

## 2017-11-18 DIAGNOSIS — K219 Gastro-esophageal reflux disease without esophagitis: Secondary | ICD-10-CM | POA: Diagnosis not present

## 2017-11-18 DIAGNOSIS — Z888 Allergy status to other drugs, medicaments and biological substances status: Secondary | ICD-10-CM | POA: Insufficient documentation

## 2017-11-18 DIAGNOSIS — G4730 Sleep apnea, unspecified: Secondary | ICD-10-CM | POA: Diagnosis not present

## 2017-11-18 HISTORY — PX: COLONOSCOPY WITH PROPOFOL: SHX5780

## 2017-11-18 HISTORY — DX: Personal history of urinary calculi: Z87.442

## 2017-11-18 LAB — GLUCOSE, CAPILLARY: Glucose-Capillary: 83 mg/dL (ref 65–99)

## 2017-11-18 SURGERY — COLONOSCOPY WITH PROPOFOL
Anesthesia: Monitor Anesthesia Care

## 2017-11-18 MED ORDER — LACTATED RINGERS IV SOLN: INTRAVENOUS | Status: DC

## 2017-11-18 MED ORDER — ALBUTEROL SULFATE (2.5 MG/3ML) 0.083% IN NEBU
INHALATION_SOLUTION | RESPIRATORY_TRACT | Status: AC
  Filled 2017-11-18: qty 3

## 2017-11-18 MED ORDER — SODIUM CHLORIDE 0.9 % IV SOLN: INTRAVENOUS | Status: DC

## 2017-11-18 MED ORDER — PROPOFOL 10 MG/ML IV BOLUS
INTRAVENOUS | Status: AC
  Filled 2017-11-18: qty 60

## 2017-11-18 MED ORDER — ALBUTEROL SULFATE (2.5 MG/3ML) 0.083% IN NEBU
2.5000 mg | INHALATION_SOLUTION | Freq: Once | RESPIRATORY_TRACT | Status: AC
  Administered 2017-11-18: 2.5 mg via RESPIRATORY_TRACT

## 2017-11-18 SURGICAL SUPPLY — 21 items

## 2017-11-18 NOTE — Discharge Instructions (Signed)

## 2017-11-18 NOTE — Op Note (Addendum)
[facility] Patient Name: Michele [name] Procedure Date: 11/18/2017 MRN: [id number] Attending MD: Carol Ada , MD Date of Birth: 1950-12-20 CSN: [id number] Age: 67 Admit Type: Outpatient Procedure:                Colonoscopy Indications:              Screening for colorectal malignant neoplasm Providers:                Carol Ada, MD, Elmer Ramp. Tilden Dome, RN, Cletis Athens,                            Technician Referring MD:              Medicines:                 Complications:            No immediate complications. Estimated Blood Loss:     Estimated blood loss: none. Procedure:                Pre-Anesthesia Assessment:                           - Prior to the procedure, a History and Physical                            was performed, and patient medications and                            allergies were reviewed. The patient's tolerance of                            previous anesthesia was also reviewed. The risks                            and benefits of the procedure and the sedation                            options and risks were discussed with the patient.                            All questions were answered, and informed consent                            was obtained. Prior Anticoagulants: The patient has                            taken no previous anticoagulant or antiplatelet                            agents. ASA Grade Assessment: III - A patient with                            severe systemic disease. After reviewing the risks                            and  benefits, the patient was deemed in                            satisfactory condition to undergo the procedure.                           - No sedation medications were administered.                           After obtaining informed consent, the colonoscope                            was passed under direct vision. Throughout the                            procedure, the patient's blood pressure,  pulse, and                            oxygen saturations were monitored continuously. The                            [id number] ([id number]) scope was introduced through                            the anus and advanced to the the cecum, identified                            by appendiceal orifice and ileocecal valve. The                            colonoscopy was somewhat difficult due to                            significant looping. Successful completion of the                            procedure was aided by changing the patient's                            position and using manual pressure. The patient                            tolerated the procedure well. The quality of the                            bowel preparation was good. The ileocecal valve,                            appendiceal orifice, and rectum were photographed. Scope In: 9:30:05 AM Scope Out: 10:04:42 AM Scope Withdrawal Time: 0 hours 20 minutes 40 seconds  Total Procedure Duration: 0 hours 34 minutes 37 seconds  Findings:      The entire examined colon appeared normal. Impression:               -  The entire examined colon is normal.                           - No specimens collected. Moderate Sedation:      None Recommendation:           - Patient has a contact number available for                            emergencies. The signs and symptoms of potential                            delayed complications were discussed with the                            patient. Return to normal activities tomorrow.                            Written discharge instructions were provided to the                            patient.                           - Resume previous diet.                           - Continue present medications.                           - Repeat colonoscopy in 10 years for surveillance. Procedure Code(s):        --- Professional ---                           [phone], Colonoscopy, flexible; diagnostic,  including                            collection of specimen(s) by brushing or washing,                            when performed (separate procedure) Diagnosis Code(s):        --- Professional ---                           Z12.11, Encounter for screening for malignant                            neoplasm of colon CPT copyright 2017 American Medical Association. All rights reserved. The codes documented in this report are preliminary and upon coder review may  be revised to meet current compliance requirements. Carol Ada, MD Carol Ada, MD 11/18/2017 10:15:03 AM This report has been signed electronically. Number of Addenda: 0

## 2017-11-18 NOTE — Progress Notes (Signed)
Patient in recovery.  Line attempt site in good condition.  No swelling or bruising noted.

## 2017-11-18 NOTE — H&P (Signed)
Michele [name] HPI: This 67 year old black female presents to the office for a hospital follow up visit. She was accompanied by her caregiver. She was admitted to [facility] ED on 09/24/2017 with chest pain and anemia. She has a past history of iron deficiency anemia. Her hemoglobin upon admission was 8.3 gm/dl. She was given a blood tranfusion while in the hospital. She had an EGD done by Dr. Clarene Essex during that hospitalization on 09/25/2017 which revealed a normal appearing esophagus, gastritis, duodenal diverticulum and clotted blood in the greater curvature of the stomach; the exam was otherwise normal, there was also blood in the third portion of the duodenum. She is currently taking Pantoprazole for acid reflux. She was on Warfarin and Aspirin for CAD. She has 4-5 BM's per day with bouts of constipation. She has occasional rectal bleeding that she feels is from her hemorrhoids. She has some mild RLQ pain today. The pain does not radiate. She has a fairly good appetite and has lost 17 lbs since 2015. She denies having any complaints of nausea, vomiting, dysphagia or odynophagia. She denies having a family history of colon cancer, celiac sprue or IBD. Her last colonoscopy done in 2009 revealed internal hemorrhoids but was an otherwise normal exam.   Past Medical History:  Diagnosis Date  . Allergic rhinitis   . ARF (acute renal failure) ([place]) 03/2017  . 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 Asthma   . CAD (coronary artery disease)   . CAD (coronary artery disease) 01/30/2007   stents trivial coronary artery disease diffusely, with a recent deployment od a intracoronary artery stent, 3.5 x 12 mm driver stent with no more than 20-30% in- stents restenosis.done by Dr [name] with re-look on novenber 10 2008 revealing a widely patent stent with otherwise trival CAD and normal LV function  . Carpal tunnel syndrome, right   . Chest pain 07/17/12009   2 D Echo EF >55%  . Complication of  anesthesia    slow to wake up  . Depression   . Diabetes mellitus type II   . Gait instability   . History of kidney stones   . Hyperlipidemia   . Hypertension   . Iron deficiency anemia 07/13/2016  . Obesity   . Paroxysmal A-fib ([place])   . PTSD (post-traumatic stress disorder)   . Sleep apnea    cpap    Past Surgical History:  Procedure Laterality Date  . cardiac stents    . CHOLECYSTECTOMY    . ESOPHAGOGASTRODUODENOSCOPY N/A 09/25/2017   Procedure: ESOPHAGOGASTRODUODENOSCOPY (EGD);  Surgeon: Clarene Essex, MD;  Location: [place];  Service: Endoscopy;  Laterality: N/A;  . GALLBLADDER SURGERY    . HEMORRHOID SURGERY    . Kidney stones    . Left groin cyst    . multilevel lumbar degenerative disc disease    . stress myocardial perfusion study  09/08/2010   normal; EF 75%    Family History  Problem Relation Age of Onset  . Coronary artery disease Mother   . Coronary artery disease Father   . Depression Father     Social History:  reports that she has never smoked. She has never used smokeless tobacco. She reports that she does not drink alcohol or use drugs.  Allergies:  Allergies  Allergen Reactions  . Meprobamate Nausea And Vomiting    Medications: Scheduled: Continuous:  No results found for this or any previous visit (from the past 24 hour(s)).  No results found.  ROS:  As stated above in the HPI otherwise negative.  There were no vitals taken for this visit.    PE: Gen: NAD, Alert and Oriented HEENT:  [place]/AT, EOMI Neck: Supple, no LAD Lungs: CTA Bilaterally CV: RRR without M/G/R ABM: Soft, NTND, +BS Ext: No C/C/E  Assessment/Plan: 1) IDA - Colonoscopy for further work up.    Obediah Welles D 11/18/2017, 7:56 AM

## 2017-11-18 NOTE — Anesthesia Preprocedure Evaluation (Signed)
Anesthesia Evaluation  Patient identified by MRN, date of birth, ID band Patient awake    Reviewed: Allergy & Precautions, NPO status , Patient's Chart, lab work & pertinent test results  Airway Mallampati: II  TM Distance: >3 FB Neck ROM: Full    Dental no notable dental hx.    Pulmonary asthma , sleep apnea ,    Pulmonary exam normal breath sounds clear to auscultation       Cardiovascular hypertension, Pt. on medications + CAD and + Cardiac Stents  Normal cardiovascular exam+ dysrhythmias Atrial Fibrillation  Rhythm:Regular Rate:Normal     Neuro/Psych negative neurological ROS  negative psych ROS   GI/Hepatic negative GI ROS, Neg liver ROS,   Endo/Other  diabetes, Type 2  Renal/GU negative Renal ROS  negative genitourinary   Musculoskeletal negative musculoskeletal ROS (+)   Abdominal   Peds negative pediatric ROS (+)  Hematology negative hematology ROS (+)   Anesthesia Other Findings   Reproductive/Obstetrics negative OB ROS                             Anesthesia Physical Anesthesia Plan  ASA: III  Anesthesia Plan: MAC   Post-op Pain Management:    Induction:   PONV Risk Score and Plan: 2 and Ondansetron and Treatment may vary due to age or medical condition  Airway Management Planned: Nasal Cannula  Additional Equipment:   Intra-op Plan:   Post-operative Plan:   Informed Consent: I have reviewed the patients History and Physical, chart, labs and discussed the procedure including the risks, benefits and alternatives for the proposed anesthesia with the patient or authorized representative who has indicated his/her understanding and acceptance.   Dental advisory given  Plan Discussed with: CRNA  Anesthesia Plan Comments:         Anesthesia Quick Evaluation

## 2017-11-21 ENCOUNTER — Encounter (HOSPITAL_COMMUNITY): Payer: Self-pay | Admitting: Gastroenterology

## 2017-11-22 ENCOUNTER — Ambulatory Visit (HOSPITAL_COMMUNITY): Payer: Medicare Other | Admitting: Psychiatry

## 2017-11-24 ENCOUNTER — Ambulatory Visit (INDEPENDENT_AMBULATORY_CARE_PROVIDER_SITE_OTHER): Payer: Medicare Other | Admitting: Pharmacist

## 2017-11-24 ENCOUNTER — Other Ambulatory Visit (HOSPITAL_COMMUNITY): Payer: Self-pay

## 2017-11-24 ENCOUNTER — Encounter (HOSPITAL_COMMUNITY): Payer: Self-pay | Admitting: Emergency Medicine

## 2017-11-24 ENCOUNTER — Ambulatory Visit (HOSPITAL_COMMUNITY)
Admission: EM | Admit: 2017-11-24 | Discharge: 2017-11-24 | Disposition: A | Discharge status: Home / Self Care | Payer: Medicare Other | Attending: Family Medicine | Admitting: Family Medicine

## 2017-11-24 DIAGNOSIS — L03116 Cellulitis of left lower limb: Secondary | ICD-10-CM

## 2017-11-24 DIAGNOSIS — I480 Paroxysmal atrial fibrillation: Secondary | ICD-10-CM

## 2017-11-24 DIAGNOSIS — Z7901 Long term (current) use of anticoagulants: Secondary | ICD-10-CM | POA: Diagnosis not present

## 2017-11-24 DIAGNOSIS — F331 Major depressive disorder, recurrent, moderate: Secondary | ICD-10-CM

## 2017-11-24 LAB — POCT INR: INR: 1.8

## 2017-11-24 MED ORDER — DOXYCYCLINE HYCLATE 100 MG PO TABS: 100.0000 mg | ORAL_TABLET | Freq: Two times a day (BID) | ORAL | 0 refills | Status: DC

## 2017-11-24 NOTE — Discharge Instructions (Addendum)
Take 1-1/2 Coumadin tonight only.  After that, take your usual dose of 7.5 mg daily until the next INR check next week.  Take the antibiotic twice a day  Recheck as planned next week

## 2017-11-24 NOTE — ED Triage Notes (Addendum)
PT reports leg pain, swelling, and warm to touch over back of left leg above knee. This has been present for 3 days. No history of blood clot. PT is on warfarin. ** PT stopped warfarin 4/29 - 5/4 for her colonoscopy, but was on Lovenox during part of that time for coverage.

## 2017-11-24 NOTE — ED Provider Notes (Signed)
[place]   [id number] 11/24/17 Arrival Time: 7371   SUBJECTIVE:  Michele [name] is a 67 y.o. female who presents to the urgent care with complaint of left leg pain, swelling, and warm to touch over back of left leg above knee. This has been present for 3 days. No history of blood clot.   PT is on warfarin for atrial fibrillation.    PT stopped warfarin 4/29 - 5/4 for her colonoscopy, but was on Lovenox during part of that time for coverage.   She had no anesthesia for the colonoscopy.  She has never had thrombophlebitis or blood clots in her lungs.  She has no chest pain or shortness of breath currently.  She has had no fever.  Patient had an INR of 1.8 today.    Past Medical History:  Diagnosis Date  . Allergic rhinitis   . ARF (acute renal failure) ([place]) 03/2017  . 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 Asthma   . CAD (coronary artery disease)   . CAD (coronary artery disease) 01/30/2007   stents trivial coronary artery disease diffusely, with a recent deployment od a intracoronary artery stent, 3.5 x 12 mm driver stent with no more than 20-30% in- stents restenosis.done by Dr [name] with re-look on novenber 10 2008 revealing a widely patent stent with otherwise trival CAD and normal LV function  . Carpal tunnel syndrome, right   . Chest pain 07/17/12009   2 D Echo EF >55%  . Complication of anesthesia    slow to wake up  . Depression   . Diabetes mellitus type II   . Gait instability   . History of kidney stones   . Hyperlipidemia   . Hypertension   . Iron deficiency anemia 07/13/2016  . Obesity   . Paroxysmal A-fib ([place])   . PTSD (post-traumatic stress disorder)   . Sleep apnea    cpap   Family History  Problem Relation Age of Onset  . Coronary artery disease Mother   . Coronary artery disease Father   . Depression Father    Social History   Socioeconomic History  . Marital status: Divorced    Spouse name: Not on file  . Number  of children: 2  . Years of education: 23  . Highest education level: Not on file  Occupational History  . Occupation: NURSING [employer]    Employer: [employer]    Comment: Retired  Scientific laboratory technician  . Financial resource strain: Not on file  . Food insecurity:    Worry: Not on file    Inability: Not on file  . Transportation needs:    Medical: Not on file    Non-medical: Not on file  Tobacco Use  . Smoking status: Never Smoker  . Smokeless tobacco: Never Used  Substance and Sexual Activity  . Alcohol use: No    Alcohol/week: 0.0 oz  . Drug use: No  . Sexual activity: Not Currently  Lifestyle  . Physical activity:    Days per week: Not on file    Minutes per session: Not on file  . Stress: Not on file  Relationships  . Social connections:    Talks on phone: Not on file    Gets together: Not on file    Attends religious service: Not on file    Active member of club or organization: Not on file    Attends meetings of clubs or organizations: Not on file  Relationship status: Not on file  . Intimate partner violence:    Fear of current or ex partner: Not on file    Emotionally abused: Not on file    Physically abused: Not on file    Forced sexual activity: Not on file  Other Topics Concern  . Not on file  Social History Narrative   Lives at home alone.   Right-handed.   Occasional caffeine use.   Retired [employer] since 2014. (Rehab).     No outpatient medications have been marked as taking for the 11/24/17 encounter [facility] Encounter).   Allergies  Allergen Reactions  . Meprobamate Nausea And Vomiting      ROS: As per HPI, remainder of ROS negative.   OBJECTIVE:   Vitals:   11/24/17 1629 11/24/17 1630  BP:  (!) 154/98  Pulse:  70  Resp:  16  Temp:  98.3 F (36.8 C)  TempSrc:  Oral  SpO2:  100%  Weight: 282 lb (127.9 kg)      General appearance: alert; no distress Eyes: PERRL; EOMI; conjunctiva normal HENT: normocephalic; atraumatic;oral  mucosa normal Neck: supple Lungs: clear to auscultation bilaterally Heart: regular rate and rhythm Back: no CVA tenderness Extremities: no cyanosis or edema; swollen left thigh Skin: warm and dry; warm erythematous left posterior distal thigh which is showing some induration and tenderness. Neurologic: normal gait; grossly normal Psychological: alert and cooperative; normal mood and affect      Labs:  Results for orders placed or performed in visit on 11/24/17  POCT INR  Result Value Ref Range   INR 1.8     Labs Reviewed - No data to display  No results found.     ASSESSMENT & PLAN:  1. Cellulitis of left lower extremity   Take 1-1/2 Coumadin tonight only.  After that, take your usual dose of 7.5 mg daily until the next INR check next week.  Take the antibiotic twice a day  Recheck as planned next week  Meds ordered this encounter  Medications  . doxycycline (VIBRA-TABS) 100 MG tablet    Sig: Take 1 tablet (100 mg total) by mouth 2 (two) times daily.    Dispense:  20 tablet    Refill:  0    Reviewed expectations re: course of current medical issues. Questions answered. Outlined signs and symptoms indicating need for more acute intervention. Patient verbalized understanding. After Visit Summary given.    Procedures:      Robyn Haber, MD 11/24/17 [phone]

## 2017-11-25 ENCOUNTER — Telehealth: Payer: Self-pay | Admitting: Pharmacist

## 2017-11-25 NOTE — Telephone Encounter (Signed)
New antibiotic given to patient for cellulitis (doxycycline 100mg  twice daily for 10 days).  Please see anticoagulation note for dosing changes

## 2017-11-28 ENCOUNTER — Ambulatory Visit (INDEPENDENT_AMBULATORY_CARE_PROVIDER_SITE_OTHER): Payer: Medicare Other | Admitting: Pharmacist

## 2017-11-28 DIAGNOSIS — Z7901 Long term (current) use of anticoagulants: Secondary | ICD-10-CM | POA: Diagnosis not present

## 2017-11-28 DIAGNOSIS — I480 Paroxysmal atrial fibrillation: Secondary | ICD-10-CM

## 2017-11-28 LAB — POCT INR: INR: 2.6

## 2017-11-29 ENCOUNTER — Ambulatory Visit (HOSPITAL_COMMUNITY): Payer: Self-pay | Admitting: Licensed Clinical Social Worker

## 2017-12-05 ENCOUNTER — Ambulatory Visit (INDEPENDENT_AMBULATORY_CARE_PROVIDER_SITE_OTHER): Payer: Medicare Other | Admitting: Pharmacist

## 2017-12-05 DIAGNOSIS — I480 Paroxysmal atrial fibrillation: Secondary | ICD-10-CM

## 2017-12-05 DIAGNOSIS — Z7901 Long term (current) use of anticoagulants: Secondary | ICD-10-CM | POA: Diagnosis not present

## 2017-12-05 LAB — POCT INR: INR: 2.6 (ref 2.0–3.0)

## 2017-12-07 ENCOUNTER — Telehealth: Payer: Self-pay | Admitting: Internal Medicine

## 2017-12-07 DIAGNOSIS — R5383 Other fatigue: Secondary | ICD-10-CM | POA: Diagnosis not present

## 2017-12-07 DIAGNOSIS — G4730 Sleep apnea, unspecified: Secondary | ICD-10-CM | POA: Diagnosis not present

## 2017-12-07 DIAGNOSIS — I10 Essential (primary) hypertension: Secondary | ICD-10-CM | POA: Diagnosis not present

## 2017-12-07 DIAGNOSIS — L0390 Cellulitis, unspecified: Secondary | ICD-10-CM | POA: Diagnosis not present

## 2017-12-07 DIAGNOSIS — L03116 Cellulitis of left lower limb: Secondary | ICD-10-CM | POA: Diagnosis not present

## 2017-12-07 DIAGNOSIS — R7309 Other abnormal glucose: Secondary | ICD-10-CM | POA: Diagnosis not present

## 2017-12-07 DIAGNOSIS — E785 Hyperlipidemia, unspecified: Secondary | ICD-10-CM | POA: Diagnosis not present

## 2017-12-07 NOTE — Telephone Encounter (Signed)
Called and left voice msg w/ Starling Manns re change in time of appt on 12/27/17 @ 11:15 for lab and 11:45 for MM.  Calendar mailed.

## 2017-12-09 ENCOUNTER — Telehealth: Payer: Self-pay | Admitting: Adult Health

## 2017-12-09 NOTE — Telephone Encounter (Signed)
New Message    Patients caregiver Sheldon Silvan is calling to report patient BP. She states that she was advised to call whenever the patients BP go over 140/60.     Pt c/o BP issue:  1. What are your last 5 BP readings? 142/82 (at the doctors office yesterday) 2. Are you having any other symptoms (ex. Dizziness, headache, blurred vision, passed out)? 3. What is your medication issue?

## 2017-12-09 NOTE — Telephone Encounter (Signed)
Per DPR, Sheldon Silvan isn't authorized to received health information.   Spoke with patient and she called to report her BP has been ranging around 140's/90's. Patient said she was advised to contact our office if this occurs. Confirmed that patient is taking metoprolol 25 mg BID Patient advised that this message would be sent to her provider and that she should continue monitoring her BP using the same machine, same arm and preferably an hour after taking BP medication. Advised that if symptoms of lightheadedness, dizziness occurs, to go to the ED for an evaluation. Verbalized understanding of plan.

## 2017-12-13 ENCOUNTER — Other Ambulatory Visit (HOSPITAL_COMMUNITY): Payer: Self-pay

## 2017-12-13 ENCOUNTER — Ambulatory Visit (INDEPENDENT_AMBULATORY_CARE_PROVIDER_SITE_OTHER): Payer: Medicare Other | Admitting: Pharmacist Clinician (PhC)/ Clinical Pharmacy Specialist

## 2017-12-13 ENCOUNTER — Other Ambulatory Visit: Payer: Self-pay | Admitting: Cardiovascular Disease

## 2017-12-13 DIAGNOSIS — F331 Major depressive disorder, recurrent, moderate: Secondary | ICD-10-CM

## 2017-12-13 DIAGNOSIS — I480 Paroxysmal atrial fibrillation: Secondary | ICD-10-CM

## 2017-12-13 DIAGNOSIS — Z7901 Long term (current) use of anticoagulants: Secondary | ICD-10-CM

## 2017-12-13 LAB — POCT INR: INR: 2.8 (ref 2.0–3.0)

## 2017-12-13 MED ORDER — ARIPIPRAZOLE 5 MG PO TABS: 5.0000 mg | ORAL_TABLET | Freq: Every day | ORAL | 0 refills | Status: DC

## 2017-12-13 MED ORDER — SERTRALINE HCL 100 MG PO TABS: 200.0000 mg | ORAL_TABLET | Freq: Every day | ORAL | 0 refills | Status: DC

## 2017-12-20 ENCOUNTER — Ambulatory Visit (INDEPENDENT_AMBULATORY_CARE_PROVIDER_SITE_OTHER): Payer: Medicare Other | Admitting: Pharmacist

## 2017-12-20 DIAGNOSIS — Z7901 Long term (current) use of anticoagulants: Secondary | ICD-10-CM | POA: Diagnosis not present

## 2017-12-20 DIAGNOSIS — I480 Paroxysmal atrial fibrillation: Secondary | ICD-10-CM

## 2017-12-20 LAB — POCT INR: INR: 3.6 — AB (ref 2.0–3.0)

## 2017-12-23 ENCOUNTER — Ambulatory Visit: Payer: Medicare Other | Admitting: Physical Therapy

## 2017-12-27 ENCOUNTER — Inpatient Hospital Stay: Payer: Medicare Other | Attending: Internal Medicine | Admitting: Internal Medicine

## 2017-12-27 ENCOUNTER — Inpatient Hospital Stay: Payer: Medicare Other

## 2017-12-27 ENCOUNTER — Encounter: Payer: Self-pay | Admitting: Internal Medicine

## 2017-12-27 VITALS — BP 144/85 | HR 61 | Temp 97.5°F | Resp 18 | Ht 62.0 in | Wt 285.5 lb

## 2017-12-27 DIAGNOSIS — D500 Iron deficiency anemia secondary to blood loss (chronic): Secondary | ICD-10-CM

## 2017-12-27 DIAGNOSIS — I10 Essential (primary) hypertension: Secondary | ICD-10-CM | POA: Insufficient documentation

## 2017-12-27 DIAGNOSIS — D509 Iron deficiency anemia, unspecified: Secondary | ICD-10-CM | POA: Insufficient documentation

## 2017-12-27 DIAGNOSIS — E119 Type 2 diabetes mellitus without complications: Secondary | ICD-10-CM | POA: Insufficient documentation

## 2017-12-27 LAB — CBC WITH DIFFERENTIAL (CANCER CENTER ONLY)
BASOS ABS: 0.1 10*3/uL (ref 0.0–0.1)
Basophils Relative: 1 %
Eosinophils Absolute: 0.6 10*3/uL — ABNORMAL HIGH (ref 0.0–0.5)
Eosinophils Relative: 9 %
HCT: 36.7 % (ref 34.8–46.6)
HEMOGLOBIN: 10.9 g/dL — AB (ref 11.6–15.9)
LYMPHS ABS: 2.2 10*3/uL (ref 0.9–3.3)
Lymphocytes Relative: 35 %
MCH: 21.3 pg — AB (ref 25.1–34.0)
MCHC: 29.7 g/dL — AB (ref 31.5–36.0)
MCV: 71.8 fL — AB (ref 79.5–101.0)
MONOS PCT: 8 %
Monocytes Absolute: 0.5 10*3/uL (ref 0.1–0.9)
NEUTROS ABS: 3.1 10*3/uL (ref 1.5–6.5)
NEUTROS PCT: 47 %
Platelet Count: 288 10*3/uL (ref 145–400)
RBC: 5.11 MIL/uL (ref 3.70–5.45)
RDW: 19.1 % — ABNORMAL HIGH (ref 11.2–14.5)
WBC Count: 6.4 10*3/uL (ref 3.9–10.3)

## 2017-12-27 LAB — IRON AND TIBC
Iron: 49 ug/dL (ref 41–142)
Saturation Ratios: 17 % — ABNORMAL LOW (ref 21–57)
TIBC: 296 ug/dL (ref 236–444)
UIBC: 247 ug/dL

## 2017-12-27 LAB — FERRITIN: FERRITIN: 346 ng/mL — AB (ref 9–269)

## 2017-12-27 NOTE — Progress Notes (Signed)
[facility] [phone]   [phone]  OFFICE PROGRESS NOTE  [name], [address] [place] [address]  DIAGNOSIS: Unspecified anemia questionable for anemia of chronic disease/iron deficiency.   PRIOR THERAPY: Status post Feraheme infusion 510 mg IV weekly on as-needed basis.  Last dose was given August 12, 2017.  CURRENT THERAPY: None  INTERVAL HISTORY: Michele [name] 67 y.o. female returns to the clinic today for follow-up visit accompanied by a family member.  The patient is feeling fine today with no specific complaints except for mild fatigue.  She was treated recently for cellulitis of the lower extremities with doxycycline for 10 days.  She denied having any current chest pain but has shortness of breath with exertion with mild cough and no hemoptysis.  She denied having any recent weight loss or night sweats.  She has no nausea, vomiting, diarrhea or constipation.  The patient had a repeat CBC, iron study and ferritin performed recently and she is here for evaluation and discussion of her lab results.  MEDICAL HISTORY: Past Medical History:  Diagnosis Date  . Allergic rhinitis   . ARF (acute renal failure) ([place]) 03/2017  . 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 Asthma   . CAD (coronary artery disease)   . CAD (coronary artery disease) 01/30/2007   stents trivial coronary artery disease diffusely, with a recent deployment od a intracoronary artery stent, 3.5 x 12 mm driver stent with no more than 20-30% in- stents restenosis.done by Dr [name] with re-look on novenber 10 2008 revealing a widely patent stent with otherwise trival CAD and normal LV function  . Carpal tunnel syndrome, right   . Chest pain 07/17/12009   2 D Echo EF >55%  . Complication of anesthesia    slow to wake up  . Depression   . Diabetes mellitus type II   . Gait instability   . History of kidney stones   . Hyperlipidemia     . Hypertension   . Iron deficiency anemia 07/13/2016  . Obesity   . Paroxysmal A-fib ([place])   . PTSD (post-traumatic stress disorder)   . Sleep apnea    cpap    ALLERGIES:  is allergic to meprobamate.  MEDICATIONS:  Current Outpatient Medications  Medication Sig Dispense Refill  . ADVAIR DISKUS 250-50 MCG/DOSE AEPB INHALE ONE PUFF TWICE DAILY 60 each 5  . albuterol (PROVENTIL) (2.5 MG/3ML) 0.083% nebulizer solution Take 2.5 mg by nebulization every 6 (six) hours as needed for wheezing or shortness of breath.    Marland Kitchen albuterol (VENTOLIN HFA) 108 (90 Base) MCG/ACT inhaler Inhale 1 puff into the lungs every 6 (six) hours as needed for wheezing or shortness of breath. (Patient taking differently: Inhale 2 puffs into the lungs every 6 (six) hours as needed for wheezing or shortness of breath. ) 18 g 4  . ARIPiprazole (ABILIFY) 5 MG tablet Take 1 tablet (5 mg total) by mouth daily. 30 tablet 0  . doxycycline (VIBRA-TABS) 100 MG tablet Take 1 tablet (100 mg total) by mouth 2 (two) times daily. 20 tablet 0  . gabapentin (NEURONTIN) 300 MG capsule Take 300 mg by mouth 3 (three) times daily as needed (for pain).   0  . metFORMIN (GLUCOPHAGE-XR) 500 MG 24 hr tablet TAKE ONE (1) TABLET BY MOUTH EVERY DAY WITH BREAKFAST 90 tablet 3  . metoprolol tartrate (LOPRESSOR) 25 MG tablet Take 1 tablet (25  mg total) by mouth 2 (two) times daily. 15 tablet 0  . mometasone (NASONEX) 50 MCG/ACT nasal spray Place 2 sprays into the nose daily. (Patient taking differently: Place 2 sprays into the nose daily as needed (for allergies). ) 17 g 2  . nitroGLYCERIN (NITROSTAT) 0.4 MG SL tablet PLACE 1 TABLET UNDER THE TONGUE EVERY 5 MINUTES AS NEEDED FOR CHEST PAIN.  MAY REPEAT 3 TIMES.  IF NO RELIEF CALL DOCTOR 25 tablet 11  . OXYGEN Inhale 2 L into the lungs as needed (for shortness of breath).     . pantoprazole (PROTONIX) 40 MG tablet Take 1 tablet (40 mg total) by mouth 2 (two) times daily before a meal. Take twice a day  before meals for 8 weeks, then once a day (Patient taking differently: Take 40 mg by mouth See admin instructions. Take 40 mg by mouth twice daily, starting 05/01 take 40 mg by mouth once daily) 60 tablet 4  . rosuvastatin (CRESTOR) 10 MG tablet TAKE ONE (1) TABLET BY MOUTH EVERY DAY (Patient taking differently: TAKE ONE (1) TABLET (10 MG)  BY MOUTH EVERY DAY) 90 tablet 1  . sertraline (ZOLOFT) 100 MG tablet Take 2 tablets (200 mg total) by mouth daily. 60 tablet 0  . warfarin (COUMADIN) 7.5 MG tablet TAKE ONE AND ONE-HALF TO TWO TABLETS BY MOUTH DAILY AS DIRECTED 150 tablet 0   No current facility-administered medications for this visit.     SURGICAL HISTORY:  Past Surgical History:  Procedure Laterality Date  . cardiac stents    . CHOLECYSTECTOMY    . COLONOSCOPY WITH PROPOFOL N/A 11/18/2017   Procedure: COLONOSCOPY WITH PROPOFOL;  Surgeon: Carol Ada, MD;  Location: [facility];  Service: Endoscopy;  Laterality: N/A;  . ESOPHAGOGASTRODUODENOSCOPY N/A 09/25/2017   Procedure: ESOPHAGOGASTRODUODENOSCOPY (EGD);  Surgeon: Clarene Essex, MD;  Location: [facility];  Service: Endoscopy;  Laterality: N/A;  . GALLBLADDER SURGERY    . HEMORRHOID SURGERY    . Kidney stones    . Left groin cyst    . multilevel lumbar degenerative disc disease    . stress myocardial perfusion study  09/08/2010   normal; EF 75%    REVIEW OF SYSTEMS:  A comprehensive review of systems was negative except for: Constitutional: positive for fatigue Respiratory: positive for cough and dyspnea on exertion   PHYSICAL EXAMINATION: General appearance: alert, cooperative, fatigued and no distress Head: Normocephalic, without obvious abnormality, atraumatic Neck: no adenopathy, no JVD, supple, symmetrical, trachea midline and thyroid not enlarged, symmetric, no tenderness/mass/nodules Lymph nodes: Cervical, supraclavicular, and axillary nodes normal. Resp: clear to auscultation bilaterally Back: symmetric, no curvature.  ROM normal. No CVA tenderness. Cardio: regular rate and rhythm, S1, S2 normal, no murmur, click, rub or gallop GI: soft, non-tender; bowel sounds normal; no masses,  no organomegaly Extremities: extremities normal, atraumatic, no cyanosis or edema  ECOG PERFORMANCE STATUS: 1 - Symptomatic but completely ambulatory  Blood pressure (!) 144/85, pulse 61, temperature (!) 97.5 F (36.4 C), temperature source Oral, resp. rate 18, height 5\' 2"  (1.575 m), weight 285 lb 8 oz (129.5 kg), SpO2 99 %.  LABORATORY DATA: Lab Results  Component Value Date   WBC 6.4 12/27/2017   HGB 10.9 (L) 12/27/2017   HCT 36.7 12/27/2017   MCV 71.8 (L) 12/27/2017   PLT 288 12/27/2017      Chemistry      Component Value Date/Time   NA 140 09/28/2017 0429   NA 144 10/02/2012 0905   K 3.5 09/28/2017  0429   K 3.4 (L) 10/02/2012 0905   CL 104 09/28/2017 0429   CL 101 10/02/2012 0905   CO2 28 09/28/2017 0429   CO2 31 (H) 10/02/2012 0905   BUN 12 09/28/2017 0429   BUN 13.9 10/02/2012 0905   CREATININE 0.71 09/28/2017 0429   CREATININE 0.77 12/08/2014 1153   CREATININE 0.8 10/02/2012 0905      Component Value Date/Time   CALCIUM 8.3 (L) 09/28/2017 0429   CALCIUM 9.9 10/02/2012 0905   ALKPHOS 61 09/24/2017 2009   AST 28 09/24/2017 2009   ALT 17 09/24/2017 2009   BILITOT 0.5 09/24/2017 2009     Serum ferritin 346, serum iron 49, total iron-binding capacity 296 and iron saturation 17%.  RADIOGRAPHIC STUDIES: No results found.  ASSESSMENT AND PLAN:  This is a very pleasant 67 years old African-American female with iron deficiency anemia plus/minus anemia of chronic disease status post Feraheme infusion. Her last Feraheme infusion was given on August 12, 2017.   Her CBC today showed mild but stable anemia.  Iron study and ferritin are unremarkable. I discussed the lab results with the patient and recommended for her to continue on observation with repeat CBC, iron study and ferritin in 3 months. She was  advised to call immediately if she has any concerning symptoms in the interval. The patient voices understanding of current disease status and treatment options and is in agreement with the current care plan. All questions were answered. The patient knows to call the clinic with any problems, questions or concerns. We can certainly see the patient much sooner if necessary. I spent 10 minutes counseling the patient face to face. The total time spent in the appointment was 15 minutes.  Disclaimer: This note was dictated with voice recognition software. Similar sounding words can inadvertently be transcribed and may not be corrected upon review.

## 2017-12-28 ENCOUNTER — Ambulatory Visit (INDEPENDENT_AMBULATORY_CARE_PROVIDER_SITE_OTHER): Payer: Medicare Other | Admitting: Psychiatry

## 2017-12-28 ENCOUNTER — Encounter (HOSPITAL_COMMUNITY): Payer: Self-pay | Admitting: Psychiatry

## 2017-12-28 VITALS — BP 121/80 | HR 79 | Ht 62.0 in | Wt 277.0 lb

## 2017-12-28 DIAGNOSIS — Z9889 Other specified postprocedural states: Secondary | ICD-10-CM

## 2017-12-28 DIAGNOSIS — Z818 Family history of other mental and behavioral disorders: Secondary | ICD-10-CM | POA: Diagnosis not present

## 2017-12-28 DIAGNOSIS — R450 Nervousness: Secondary | ICD-10-CM

## 2017-12-28 DIAGNOSIS — Z91410 Personal history of adult physical and sexual abuse: Secondary | ICD-10-CM

## 2017-12-28 DIAGNOSIS — R2681 Unsteadiness on feet: Secondary | ICD-10-CM

## 2017-12-28 DIAGNOSIS — F331 Major depressive disorder, recurrent, moderate: Secondary | ICD-10-CM

## 2017-12-28 DIAGNOSIS — G4700 Insomnia, unspecified: Secondary | ICD-10-CM

## 2017-12-28 DIAGNOSIS — F411 Generalized anxiety disorder: Secondary | ICD-10-CM | POA: Diagnosis not present

## 2017-12-28 MED ORDER — SERTRALINE HCL 100 MG PO TABS: 200.0000 mg | ORAL_TABLET | Freq: Every day | ORAL | 0 refills | Status: DC

## 2017-12-28 MED ORDER — ARIPIPRAZOLE 5 MG PO TABS: 5.0000 mg | ORAL_TABLET | Freq: Every day | ORAL | 0 refills | Status: DC

## 2017-12-28 MED ORDER — HYDROXYZINE HCL 10 MG PO TABS: 10.0000 mg | ORAL_TABLET | Freq: Every day | ORAL | 0 refills | Status: DC
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
## 2018-01-03 ENCOUNTER — Ambulatory Visit (INDEPENDENT_AMBULATORY_CARE_PROVIDER_SITE_OTHER): Payer: Medicare Other | Admitting: Pharmacist Clinician (PhC)/ Clinical Pharmacy Specialist

## 2018-01-03 ENCOUNTER — Encounter: Payer: Self-pay | Admitting: Cardiovascular Disease

## 2018-01-03 ENCOUNTER — Ambulatory Visit: Payer: Medicare Other | Admitting: Cardiovascular Disease

## 2018-01-03 DIAGNOSIS — I10 Essential (primary) hypertension: Secondary | ICD-10-CM

## 2018-01-03 DIAGNOSIS — I480 Paroxysmal atrial fibrillation: Secondary | ICD-10-CM | POA: Diagnosis not present

## 2018-01-03 DIAGNOSIS — I2510 Atherosclerotic heart disease of native coronary artery without angina pectoris: Secondary | ICD-10-CM

## 2018-01-03 DIAGNOSIS — Z7901 Long term (current) use of anticoagulants: Secondary | ICD-10-CM | POA: Diagnosis not present

## 2018-01-03 DIAGNOSIS — E7800 Pure hypercholesterolemia, unspecified: Secondary | ICD-10-CM | POA: Diagnosis not present

## 2018-01-03 LAB — POCT INR: INR: 1.8 — AB (ref 2.0–3.0)

## 2018-01-03 NOTE — Progress Notes (Signed)
01/03/2018 Michele [name]   [birth year]  [id number]  Primary Physician [name], MD Primary Cardiologist: Lorretta Harp MD FACP, [place], [address], [place]  HPI:  Michele [name] is a 67 y.o.  morbidly obese and who has a history of coronary disease status post stent to the proximal RCA in 2008. Her history also includes diabetes mellitus type 2, hypertension, sleep apnea, asthma, paroxysmal intrafibrillation, hyperlipidemia, arthritis. I last saw her in the office 05/06/2017. Her last nuclear stress test was 12/20/12 was nonischemic. She has normal LV function.. She has occasional atypical chest pain as well as dyspnea on exertion when walking up an incline.. Since I saw her a year ago she's remained chronically stable without change of her symptoms in either frequency or severity. She does have a history of paroxysmal A. Fib maintaining sinus rhythm on Coumadin anticoagulation. We talked about weight reduction which has been on major problem with her over the years. Unfortunately that she has not been able to lose weight despite seeing a dietitian. Since I saw her over a year ago she's remained clinically stable until September when she was admitted to [facility] with altered mental status and metabolic encephalopathy. Since I saw her in the office 9 months ago she is remained stable.  She denies chest pain or shortness of breath.    Current Meds  Medication Sig  . ADVAIR DISKUS 250-50 MCG/DOSE AEPB INHALE ONE PUFF TWICE DAILY  . albuterol (PROVENTIL) (2.5 MG/3ML) 0.083% nebulizer solution Take 2.5 mg by nebulization every 6 (six) hours as needed for wheezing or shortness of breath.  Marland Kitchen albuterol (VENTOLIN HFA) 108 (90 Base) MCG/ACT inhaler Inhale 1 puff into the lungs every 6 (six) hours as needed for wheezing or shortness of breath. (Patient taking differently: Inhale 2 puffs into the lungs every 6 (six) hours as needed for wheezing or shortness of breath. )  . ARIPiprazole  (ABILIFY) 5 MG tablet Take 1 tablet (5 mg total) by mouth daily.  Marland Kitchen gabapentin (NEURONTIN) 300 MG capsule Take 300 mg by mouth 3 (three) times daily as needed (for pain).   . hydrOXYzine (ATARAX/VISTARIL) 10 MG tablet Take 1 tablet (10 mg total) by mouth at bedtime.  Marland Kitchen MAGNESIUM GLUCONATE PO Take by mouth.  . metFORMIN (GLUCOPHAGE-XR) 500 MG 24 hr tablet TAKE ONE (1) TABLET BY MOUTH EVERY DAY WITH BREAKFAST  . metoprolol tartrate (LOPRESSOR) 25 MG tablet Take 1 tablet (25 mg total) by mouth 2 (two) times daily.  . mometasone (NASONEX) 50 MCG/ACT nasal spray Place 2 sprays into the nose daily. (Patient taking differently: Place 2 sprays into the nose daily as needed (for allergies). )  . nitroGLYCERIN (NITROSTAT) 0.4 MG SL tablet PLACE 1 TABLET UNDER THE TONGUE EVERY 5 MINUTES AS NEEDED FOR CHEST PAIN.  MAY REPEAT 3 TIMES.  IF NO RELIEF CALL DOCTOR  . OXYGEN Inhale 2 L into the lungs as needed (for shortness of breath).   . pantoprazole (PROTONIX) 40 MG tablet Take 1 tablet (40 mg total) by mouth 2 (two) times daily before a meal. Take twice a day before meals for 8 weeks, then once a day (Patient taking differently: Take 40 mg by mouth See admin instructions. Take 40 mg by mouth twice daily, starting 05/01 take 40 mg by mouth once daily)  . rosuvastatin (CRESTOR) 10 MG tablet TAKE ONE (1) TABLET BY MOUTH EVERY DAY (Patient taking differently: TAKE ONE (1) TABLET (10 MG)  BY MOUTH EVERY DAY)  .  sertraline (ZOLOFT) 100 MG tablet Take 2 tablets (200 mg total) by mouth daily.  Marland Kitchen warfarin (COUMADIN) 7.5 MG tablet TAKE ONE AND ONE-HALF TO TWO TABLETS BY MOUTH DAILY AS DIRECTED     Allergies  Allergen Reactions  . Meprobamate Nausea And Vomiting    Social History   Socioeconomic History  . Marital status: Divorced    Spouse name: Not on file  . Number of children: 2  . Years of education: 40  . Highest education level: Not on file  Occupational History  . Occupation: NURSING [place]    Employer:  [employer]    Comment: Retired  Scientific laboratory technician  . Financial resource strain: Not on file  . Food insecurity:    Worry: Not on file    Inability: Not on file  . Transportation needs:    Medical: Not on file    Non-medical: Not on file  Tobacco Use  . Smoking status: Never Smoker  . Smokeless tobacco: Never Used  Substance and Sexual Activity  . Alcohol use: No    Alcohol/week: 0.0 oz  . Drug use: No  . Sexual activity: Not Currently  Lifestyle  . Physical activity:    Days per week: Not on file    Minutes per session: Not on file  . Stress: Not on file  Relationships  . Social connections:    Talks on phone: Not on file    Gets together: Not on file    Attends religious service: Not on file    Active member of club or organization: Not on file    Attends meetings of clubs or organizations: Not on file    Relationship status: Not on file  . Intimate partner violence:    Fear of current or ex partner: Not on file    Emotionally abused: Not on file    Physically abused: Not on file    Forced sexual activity: Not on file  Other Topics Concern  . Not on file  Social History Narrative   Lives at home alone.   Right-handed.   Occasional caffeine use.   Retired [employer] since 2014. (Rehab).       Review of Systems: General: negative for chills, fever, night sweats or weight changes.  Cardiovascular: negative for chest pain, dyspnea on exertion, edema, orthopnea, palpitations, paroxysmal nocturnal dyspnea or shortness of breath Dermatological: negative for rash Respiratory: negative for cough or wheezing Urologic: negative for hematuria Abdominal: negative for nausea, vomiting, diarrhea, bright red blood per rectum, melena, or hematemesis Neurologic: negative for visual changes, syncope, or dizziness All other systems reviewed and are otherwise negative except as noted above.    Blood pressure (!) 141/89, pulse 78, height 5' 2"  (1.575 m), weight 285 lb 9.6 oz  (129.5 kg), SpO2 93 %.  General appearance: alert and no distress Neck: no adenopathy, no carotid bruit, no JVD, supple, symmetrical, trachea midline and thyroid not enlarged, symmetric, no tenderness/mass/nodules Lungs: clear to auscultation bilaterally Heart: regular rate and rhythm, S1, S2 normal, no murmur, click, rub or gallop Extremities: extremities normal, atraumatic, no cyanosis or edema Pulses: 2+ and symmetric Skin: Skin color, texture, turgor normal. No rashes or lesions Neurologic: Alert and oriented X 3, normal strength and tone. Normal symmetric reflexes. Normal coordination and gait  EKG not performed today  ASSESSMENT AND PLAN:   Essential hypertension 3 of essential hypertension her blood pressure measured at 141/89.  She is on metoprolol .  Her blood pressure readings  at home have been excellent.  Coronary atherosclerosis History of CAD status post RCA stenting in 2008.  Her last Myoview performed 12/20/2012 was nonischemic.  She denies chest pain or shortness of breath.  Paroxysmal A-fib (HCC) History of paroxysmal atrial fibrillation maintaining sinus rhythm on Coumadin anticoagulation which we follow here in the office.  Hyperlipidemia History of hyperlipidemia on statin therapy with lipid profile performed 09/25/2017 revealing a total cholesterol 103, LDL 40 and HDL of 31.      Lorretta Harp MD FACP,FACC,FAHA, [facility] 01/03/2018 11:19 AM

## 2018-01-03 NOTE — Assessment & Plan Note (Signed)
History of hyperlipidemia on statin therapy with lipid profile performed 09/25/2017 revealing a total cholesterol 103, LDL 40 and HDL of 31.

## 2018-01-03 NOTE — Assessment & Plan Note (Addendum)
3 of essential hypertension her blood pressure measured at 141/89.  She is on metoprolol .  Her blood pressure readings at home have been excellent.

## 2018-01-03 NOTE — Assessment & Plan Note (Signed)
History of CAD status post RCA stenting in 2008.  Her last Myoview performed 12/20/2012 was nonischemic.  She denies chest pain or shortness of breath.

## 2018-01-03 NOTE — Patient Instructions (Signed)
Medication Instructions: Your physician recommends that you continue on your current medications as directed. Please refer to the Current Medication list given to you today.   Follow-Up: We request that you follow-up in: 6 months with an extender and in 12 months with Dr Andria Rhein will receive a reminder letter in the mail two months in advance. If you don't receive a letter, please call our office to schedule the follow-up appointment.  If you need a refill on your cardiac medications before your next appointment, please call your pharmacy.

## 2018-01-03 NOTE — Assessment & Plan Note (Signed)
History of paroxysmal atrial fibrillation maintaining sinus rhythm on Coumadin anticoagulation which we follow here in the office.

## 2018-01-11 ENCOUNTER — Ambulatory Visit (HOSPITAL_COMMUNITY): Payer: Medicare Other | Admitting: Licensed Clinical Social Worker

## 2018-01-16 ENCOUNTER — Ambulatory Visit: Payer: Medicare Other | Admitting: Pharmacist

## 2018-01-16 DIAGNOSIS — I480 Paroxysmal atrial fibrillation: Secondary | ICD-10-CM

## 2018-01-16 DIAGNOSIS — Z7901 Long term (current) use of anticoagulants: Secondary | ICD-10-CM

## 2018-01-16 LAB — POCT INR: INR: 2.6 (ref 2.0–3.0)

## 2018-01-17 DIAGNOSIS — M1711 Unilateral primary osteoarthritis, right knee: Secondary | ICD-10-CM | POA: Diagnosis not present

## 2018-01-17 DIAGNOSIS — M1712 Unilateral primary osteoarthritis, left knee: Secondary | ICD-10-CM | POA: Diagnosis not present

## 2018-01-17 DIAGNOSIS — M170 Bilateral primary osteoarthritis of knee: Secondary | ICD-10-CM | POA: Diagnosis not present

## 2018-01-25 DIAGNOSIS — M1712 Unilateral primary osteoarthritis, left knee: Secondary | ICD-10-CM | POA: Diagnosis not present

## 2018-01-25 DIAGNOSIS — M1711 Unilateral primary osteoarthritis, right knee: Secondary | ICD-10-CM | POA: Diagnosis not present

## 2018-02-01 ENCOUNTER — Other Ambulatory Visit: Payer: Self-pay | Admitting: Cardiovascular Disease

## 2018-02-01 ENCOUNTER — Ambulatory Visit: Payer: Medicare Other | Admitting: Pharmacist

## 2018-02-01 DIAGNOSIS — M1711 Unilateral primary osteoarthritis, right knee: Secondary | ICD-10-CM | POA: Diagnosis not present

## 2018-02-01 DIAGNOSIS — Z7901 Long term (current) use of anticoagulants: Secondary | ICD-10-CM | POA: Diagnosis not present

## 2018-02-01 DIAGNOSIS — I480 Paroxysmal atrial fibrillation: Secondary | ICD-10-CM

## 2018-02-01 DIAGNOSIS — M1712 Unilateral primary osteoarthritis, left knee: Secondary | ICD-10-CM | POA: Diagnosis not present

## 2018-02-01 DIAGNOSIS — M170 Bilateral primary osteoarthritis of knee: Secondary | ICD-10-CM | POA: Diagnosis not present

## 2018-02-01 LAB — POCT INR: INR: 2.3 (ref 2.0–3.0)

## 2018-02-01 NOTE — Patient Instructions (Signed)
Description   Continue taking warfarin 1.5 tables daily except for 1 tablet every Monday, Wednesday and Friday - Repeat INR in 3 weeks.

## 2018-02-04 ENCOUNTER — Other Ambulatory Visit: Payer: Self-pay | Admitting: Cardiovascular Disease

## 2018-02-22 ENCOUNTER — Ambulatory Visit: Payer: Medicare Other | Admitting: Pharmacist Clinician (PhC)/ Clinical Pharmacy Specialist

## 2018-02-22 DIAGNOSIS — Z7901 Long term (current) use of anticoagulants: Secondary | ICD-10-CM | POA: Diagnosis not present

## 2018-02-22 DIAGNOSIS — I480 Paroxysmal atrial fibrillation: Secondary | ICD-10-CM

## 2018-02-22 LAB — POCT INR: INR: 3.8 — AB (ref 2.0–3.0)

## 2018-02-22 NOTE — Patient Instructions (Signed)
Description   No warfarin today Wednesday August 7, then decrease dose to 1 tablet daily except 1.5 tablets each Sunday, Tuesday and Thursday.  Repeat INR in 2 weeks

## 2018-03-03 ENCOUNTER — Other Ambulatory Visit: Payer: Self-pay | Admitting: Internal Medicine

## 2018-03-07 ENCOUNTER — Other Ambulatory Visit: Payer: Self-pay | Admitting: Adult Health

## 2018-03-07 ENCOUNTER — Other Ambulatory Visit (HOSPITAL_COMMUNITY): Payer: Self-pay | Admitting: Psychiatry

## 2018-03-07 DIAGNOSIS — F331 Major depressive disorder, recurrent, moderate: Secondary | ICD-10-CM

## 2018-03-09 DIAGNOSIS — R7309 Other abnormal glucose: Secondary | ICD-10-CM | POA: Diagnosis not present

## 2018-03-09 DIAGNOSIS — I10 Essential (primary) hypertension: Secondary | ICD-10-CM | POA: Diagnosis not present

## 2018-03-10 ENCOUNTER — Ambulatory Visit: Payer: Medicare Other | Admitting: Pharmacist

## 2018-03-10 ENCOUNTER — Other Ambulatory Visit: Payer: Self-pay | Admitting: Internal Medicine

## 2018-03-10 DIAGNOSIS — Z7901 Long term (current) use of anticoagulants: Secondary | ICD-10-CM

## 2018-03-10 DIAGNOSIS — Z1231 Encounter for screening mammogram for malignant neoplasm of breast: Secondary | ICD-10-CM

## 2018-03-10 DIAGNOSIS — E2839 Other primary ovarian failure: Secondary | ICD-10-CM

## 2018-03-10 DIAGNOSIS — I480 Paroxysmal atrial fibrillation: Secondary | ICD-10-CM

## 2018-03-10 LAB — POCT INR: INR: 2.7 (ref 2.0–3.0)

## 2018-03-14 DIAGNOSIS — E119 Type 2 diabetes mellitus without complications: Secondary | ICD-10-CM | POA: Diagnosis not present

## 2018-03-14 DIAGNOSIS — J449 Chronic obstructive pulmonary disease, unspecified: Secondary | ICD-10-CM | POA: Diagnosis not present

## 2018-03-14 DIAGNOSIS — H1851 Endothelial corneal dystrophy: Secondary | ICD-10-CM | POA: Diagnosis not present

## 2018-03-14 DIAGNOSIS — H2513 Age-related nuclear cataract, bilateral: Secondary | ICD-10-CM | POA: Diagnosis not present

## 2018-03-14 DIAGNOSIS — H40013 Open angle with borderline findings, low risk, bilateral: Secondary | ICD-10-CM | POA: Diagnosis not present

## 2018-03-15 DIAGNOSIS — M6281 Muscle weakness (generalized): Secondary | ICD-10-CM | POA: Diagnosis not present

## 2018-03-15 DIAGNOSIS — M545 Low back pain: Secondary | ICD-10-CM | POA: Diagnosis not present

## 2018-03-15 DIAGNOSIS — R32 Unspecified urinary incontinence: Secondary | ICD-10-CM | POA: Diagnosis not present

## 2018-03-24 DIAGNOSIS — Z23 Encounter for immunization: Secondary | ICD-10-CM | POA: Diagnosis not present

## 2018-03-29 ENCOUNTER — Inpatient Hospital Stay: Payer: Medicare Other | Attending: Internal Medicine | Admitting: Internal Medicine

## 2018-03-29 ENCOUNTER — Telehealth: Payer: Self-pay | Admitting: Internal Medicine

## 2018-03-29 ENCOUNTER — Inpatient Hospital Stay: Payer: Medicare Other

## 2018-03-29 ENCOUNTER — Encounter: Payer: Self-pay | Admitting: Internal Medicine

## 2018-03-29 VITALS — BP 111/65 | HR 66 | Temp 98.5°F | Resp 18 | Ht 62.0 in | Wt 282.5 lb

## 2018-03-29 DIAGNOSIS — K5090 Crohn's disease, unspecified, without complications: Secondary | ICD-10-CM | POA: Diagnosis not present

## 2018-03-29 DIAGNOSIS — D509 Iron deficiency anemia, unspecified: Secondary | ICD-10-CM | POA: Diagnosis not present

## 2018-03-29 DIAGNOSIS — D508 Other iron deficiency anemias: Secondary | ICD-10-CM | POA: Insufficient documentation

## 2018-03-29 DIAGNOSIS — D500 Iron deficiency anemia secondary to blood loss (chronic): Secondary | ICD-10-CM

## 2018-03-29 DIAGNOSIS — D473 Essential (hemorrhagic) thrombocythemia: Secondary | ICD-10-CM | POA: Insufficient documentation

## 2018-03-29 LAB — CBC WITH DIFFERENTIAL (CANCER CENTER ONLY)
BASOS ABS: 0.1 10*3/uL (ref 0.0–0.1)
BASOS PCT: 1 %
EOS ABS: 0.4 10*3/uL (ref 0.0–0.5)
EOS PCT: 7 %
HCT: 38.5 % (ref 34.8–46.6)
Hemoglobin: 11.7 g/dL (ref 11.6–15.9)
Lymphocytes Relative: 30 %
Lymphs Abs: 1.8 10*3/uL (ref 0.9–3.3)
MCH: 21 pg — ABNORMAL LOW (ref 25.1–34.0)
MCHC: 30.4 g/dL — ABNORMAL LOW (ref 31.5–36.0)
MCV: 69.2 fL — ABNORMAL LOW (ref 79.5–101.0)
MONO ABS: 0.6 10*3/uL (ref 0.1–0.9)
Monocytes Relative: 10 %
NEUTROS ABS: 3.1 10*3/uL (ref 1.5–6.5)
Neutrophils Relative %: 52 %
PLATELETS: 299 10*3/uL (ref 145–400)
RBC: 5.56 MIL/uL — ABNORMAL HIGH (ref 3.70–5.45)
RDW: 19.9 % — AB (ref 11.2–14.5)
WBC Count: 5.9 10*3/uL (ref 3.9–10.3)

## 2018-03-29 LAB — IRON AND TIBC
IRON: 42 ug/dL (ref 41–142)
SATURATION RATIOS: 13 % — AB (ref 21–57)
TIBC: 313 ug/dL (ref 236–444)
UIBC: 270 ug/dL

## 2018-03-29 LAB — FERRITIN: Ferritin: 331 ng/mL — ABNORMAL HIGH (ref 11–307)

## 2018-03-29 NOTE — Telephone Encounter (Signed)
Gave pt avs and calendar  °

## 2018-03-29 NOTE — Progress Notes (Signed)
[place] [phone]   [phone]  OFFICE PROGRESS NOTE  [name], [address] [place] [address]  DIAGNOSIS: Unspecified anemia questionable for anemia of chronic disease/iron deficiency.   PRIOR THERAPY: Status post Feraheme infusion 510 mg IV weekly on as-needed basis.  Last dose was given August 12, 2017.  CURRENT THERAPY: None  INTERVAL HISTORY: Michele [name] 67 y.o. female returns to the clinic today for follow-up visit accompanied by her sister.  The patient is feeling fine today with no concerning complaints except for mild fatigue.  She had a colonoscopy recently and cautery and removal of colon polyps.  She denied having any chest pain, cough or hemoptysis but has shortness of breath at baseline increased with exertion.  She is currently on home oxygen.  She denied having any recent weight loss or night sweats.  She has no nausea, vomiting, diarrhea or constipation.  The patient is here today for evaluation with repeat CBC, iron study and ferritin.   MEDICAL HISTORY: Past Medical History:  Diagnosis Date  . Allergic rhinitis   . ARF (acute renal failure) ([place]) 03/2017  . 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 Asthma   . CAD (coronary artery disease)   . CAD (coronary artery disease) 01/30/2007   stents trivial coronary artery disease diffusely, with a recent deployment od a intracoronary artery stent, 3.5 x 12 mm driver stent with no more than 20-30% in- stents restenosis.done by Dr [name] with re-look on novenber 10 2008 revealing a widely patent stent with otherwise trival CAD and normal LV function  . Carpal tunnel syndrome, right   . Chest pain 07/17/12009   2 D Echo EF >55%  . Complication of anesthesia    slow to wake up  . Depression   . Diabetes mellitus type II   . Gait instability   . History of kidney stones   . Hyperlipidemia   . Hypertension   . Iron deficiency anemia  07/13/2016  . Obesity   . Paroxysmal A-fib ([place])   . PTSD (post-traumatic stress disorder)   . Sleep apnea    cpap    ALLERGIES:  is allergic to meprobamate.  MEDICATIONS:  Current Outpatient Medications  Medication Sig Dispense Refill  . ADVAIR DISKUS 250-50 MCG/DOSE AEPB INHALE ONE PUFF TWICE DAILY 60 each 5  . albuterol (ACCUNEB) 0.63 MG/3ML nebulizer solution albuterol sulfate    . albuterol (PROVENTIL) (2.5 MG/3ML) 0.083% nebulizer solution Take 2.5 mg by nebulization every 6 (six) hours as needed for wheezing or shortness of breath.    Marland Kitchen albuterol (VENTOLIN HFA) 108 (90 Base) MCG/ACT inhaler Inhale 1 puff into the lungs every 6 (six) hours as needed for wheezing or shortness of breath. (Patient taking differently: Inhale 2 puffs into the lungs every 6 (six) hours as needed for wheezing or shortness of breath. ) 18 g 4  . ARIPiprazole (ABILIFY) 5 MG tablet Take 1 tablet (5 mg total) by mouth daily. 90 tablet 0  . ASPIRIN 81 PO aspirin 81 mg tablet  Take by oral route.    . benztropine (COGENTIN) 0.5 MG tablet benztropine 0.5 mg tablet    . diltiazem (DILTIAZEM CD) 180 MG 24 hr capsule diltiazem CD 180 mg capsule,extended release 24 hr    . doxycycline (VIBRA-TABS) 100 MG tablet doxycycline hyclate 100 mg tablet    . enoxaparin (LOVENOX) 120 MG/0.8ML injection enoxaparin 120 mg/0.8 mL subcutaneous  syringe    . furosemide (LASIX) 40 MG tablet furosemide 40 mg tablet    . gabapentin (NEURONTIN) 300 MG capsule Take 300 mg by mouth 3 (three) times daily as needed (for pain).   0  . hydrochlorothiazide (MICROZIDE) 12.5 MG capsule hydrochlorothiazide 12.5 mg capsule    . HYDROcodone-acetaminophen (NORCO) 10-325 MG tablet hydrocodone 10 mg-acetaminophen 325 mg tablet    . hydrOXYzine (ATARAX/VISTARIL) 10 MG tablet Take 1 tablet (10 mg total) by mouth at bedtime. 90 tablet 0  . irbesartan-hydrochlorothiazide (AVALIDE) 150-12.5 MG tablet irbesartan 150 mg-hydrochlorothiazide 12.5 mg tablet      . isosorbide mononitrate (IMDUR) 30 MG 24 hr tablet isosorbide mononitrate ER 30 mg tablet,extended release 24 hr    . levofloxacin (LEVAQUIN) 500 MG tablet levofloxacin 500 mg tablet    . MAGNESIUM GLUCONATE PO Take by mouth.    . metFORMIN (GLUCOPHAGE-XR) 500 MG 24 hr tablet TAKE ONE (1) TABLET BY MOUTH EVERY DAY WITH BREAKFAST 90 tablet 3  . methylPREDNISolone (MEDROL) 4 MG tablet methylprednisolone 4 mg tablets in a dose pack    . metoprolol tartrate (LOPRESSOR) 25 MG tablet Take 1 tablet (25 mg total) by mouth 2 (two) times daily. 180 tablet 3  . mometasone (NASONEX) 50 MCG/ACT nasal spray Place 2 sprays into the nose daily. (Patient taking differently: Place 2 sprays into the nose daily as needed (for allergies). ) 17 g 2  . nitroGLYCERIN (NITROSTAT) 0.4 MG SL tablet PLACE 1 TABLET UNDER THE TONGUE EVERY 5 MINUTES AS NEEDED FOR CHEST PAIN.  MAY REPEAT 3 TIMES.  IF NO RELIEF CALL DOCTOR 25 tablet 11  . OXYGEN Inhale 2 L into the lungs as needed (for shortness of breath).     . pantoprazole (PROTONIX) 40 MG tablet Take 1 tablet (40 mg total) by mouth 2 (two) times daily before a meal. Take twice a day before meals for 8 weeks, then once a day (Patient taking differently: Take 40 mg by mouth See admin instructions. Take 40 mg by mouth twice daily, starting 05/01 take 40 mg by mouth once daily) 60 tablet 4  . rosuvastatin (CRESTOR) 10 MG tablet TAKE ONE (1) TABLET BY MOUTH EVERY DAY (Patient taking differently: TAKE ONE (1) TABLET (10 MG)  BY MOUTH EVERY DAY) 90 tablet 1  . sertraline (ZOLOFT) 100 MG tablet Take 2 tablets (200 mg total) by mouth daily. 90 tablet 0  . trospium (SANCTURA) 20 MG tablet trospium    . warfarin (COUMADIN) 7.5 MG tablet TAKE ONE AND ONE-HALF TO TWO TABLETS BY MOUTH DAILY AS DIRECTED 150 tablet 0  . Warfarin Sodium (COUMADIN PO) Coumadin     No current facility-administered medications for this visit.     SURGICAL HISTORY:  Past Surgical History:  Procedure  Laterality Date  . cardiac stents    . CHOLECYSTECTOMY    . COLONOSCOPY WITH PROPOFOL N/A 11/18/2017   Procedure: COLONOSCOPY WITH PROPOFOL;  Surgeon: Carol Ada, MD;  Location: [facility];  Service: Endoscopy;  Laterality: N/A;  . ESOPHAGOGASTRODUODENOSCOPY N/A 09/25/2017   Procedure: ESOPHAGOGASTRODUODENOSCOPY (EGD);  Surgeon: Clarene Essex, MD;  Location: [facility];  Service: Endoscopy;  Laterality: N/A;  . GALLBLADDER SURGERY    . HEMORRHOID SURGERY    . Kidney stones    . Left groin cyst    . multilevel lumbar degenerative disc disease    . stress myocardial perfusion study  09/08/2010   normal; EF 75%    REVIEW OF SYSTEMS:  A comprehensive review of  systems was negative except for: Constitutional: positive for fatigue Respiratory: positive for dyspnea on exertion   PHYSICAL EXAMINATION: General appearance: alert, cooperative, fatigued and no distress Head: Normocephalic, without obvious abnormality, atraumatic Neck: no adenopathy, no JVD, supple, symmetrical, trachea midline and thyroid not enlarged, symmetric, no tenderness/mass/nodules Lymph nodes: Cervical, supraclavicular, and axillary nodes normal. Resp: clear to auscultation bilaterally Back: symmetric, no curvature. ROM normal. No CVA tenderness. Cardio: regular rate and rhythm, S1, S2 normal, no murmur, click, rub or gallop GI: soft, non-tender; bowel sounds normal; no masses,  no organomegaly Extremities: extremities normal, atraumatic, no cyanosis or edema  ECOG PERFORMANCE STATUS: 1 - Symptomatic but completely ambulatory  Blood pressure 111/65, pulse 66, temperature 98.5 F (36.9 C), temperature source Oral, resp. rate 18, height 5\' 2"  (1.575 m), weight 282 lb 8 oz (128.1 kg), SpO2 97 %.  LABORATORY DATA: Lab Results  Component Value Date   WBC 5.9 03/29/2018   HGB 11.7 03/29/2018   HCT 38.5 03/29/2018   MCV 69.2 (L) 03/29/2018   PLT 299 03/29/2018      Chemistry      Component Value Date/Time   NA  140 09/28/2017 0429   NA 144 10/02/2012 0905   K 3.5 09/28/2017 0429   K 3.4 (L) 10/02/2012 0905   CL 104 09/28/2017 0429   CL 101 10/02/2012 0905   CO2 28 09/28/2017 0429   CO2 31 (H) 10/02/2012 0905   BUN 12 09/28/2017 0429   BUN 13.9 10/02/2012 0905   CREATININE 0.71 09/28/2017 0429   CREATININE 0.77 12/08/2014 1153   CREATININE 0.8 10/02/2012 0905      Component Value Date/Time   CALCIUM 8.3 (L) 09/28/2017 0429   CALCIUM 9.9 10/02/2012 0905   ALKPHOS 61 09/24/2017 2009   AST 28 09/24/2017 2009   ALT 17 09/24/2017 2009   BILITOT 0.5 09/24/2017 2009      RADIOGRAPHIC STUDIES: No results found.  ASSESSMENT AND PLAN:  This is a very pleasant 67 years old African-American female with iron deficiency anemia plus/minus anemia of chronic disease status post Feraheme infusion. Her last Feraheme infusion was given on August 12, 2017.   The patient is currently on observation and she is feeling fine except for mild fatigue.  CBC today showed normal hemoglobin and hematocrit.  Iron study and ferritin are still pending. Amended for the patient to continue on observation with repeat CBC, iron study and ferritin in 3 months.  If the pending iron study and ferritin showed severe iron deficiency, I may consider her for Feraheme infusion again. The patient was advised to call immediately if she has any concerning symptoms in the interval. The patient voices understanding of current disease status and treatment options and is in agreement with the current care plan. All questions were answered. The patient knows to call the clinic with any problems, questions or concerns. We can certainly see the patient much sooner if necessary. I spent 10 minutes counseling the patient face to face. The total time spent in the appointment was 15 minutes.  Disclaimer: This note was dictated with voice recognition software. Similar sounding words can inadvertently be transcribed and may not be corrected upon  review.

## 2018-03-31 ENCOUNTER — Ambulatory Visit: Payer: Medicare Other | Admitting: Pharmacist

## 2018-03-31 ENCOUNTER — Ambulatory Visit (HOSPITAL_COMMUNITY): Payer: Self-pay | Admitting: Psychiatry

## 2018-03-31 DIAGNOSIS — M545 Low back pain: Secondary | ICD-10-CM | POA: Diagnosis not present

## 2018-03-31 DIAGNOSIS — I480 Paroxysmal atrial fibrillation: Secondary | ICD-10-CM

## 2018-03-31 DIAGNOSIS — Z7901 Long term (current) use of anticoagulants: Secondary | ICD-10-CM

## 2018-03-31 DIAGNOSIS — M6281 Muscle weakness (generalized): Secondary | ICD-10-CM | POA: Diagnosis not present

## 2018-03-31 DIAGNOSIS — R32 Unspecified urinary incontinence: Secondary | ICD-10-CM | POA: Diagnosis not present

## 2018-03-31 LAB — POCT INR: INR: 3.5 — AB (ref 2.0–3.0)

## 2018-04-07 ENCOUNTER — Other Ambulatory Visit (HOSPITAL_COMMUNITY): Payer: Self-pay | Admitting: Psychiatry

## 2018-04-07 DIAGNOSIS — M545 Low back pain: Secondary | ICD-10-CM | POA: Diagnosis not present

## 2018-04-07 DIAGNOSIS — M6281 Muscle weakness (generalized): Secondary | ICD-10-CM | POA: Diagnosis not present

## 2018-04-07 DIAGNOSIS — F331 Major depressive disorder, recurrent, moderate: Secondary | ICD-10-CM

## 2018-04-07 DIAGNOSIS — R32 Unspecified urinary incontinence: Secondary | ICD-10-CM | POA: Diagnosis not present

## 2018-04-12 ENCOUNTER — Ambulatory Visit: Payer: Medicare Other | Admitting: Pharmacist

## 2018-04-12 DIAGNOSIS — I480 Paroxysmal atrial fibrillation: Secondary | ICD-10-CM | POA: Diagnosis not present

## 2018-04-12 DIAGNOSIS — Z7901 Long term (current) use of anticoagulants: Secondary | ICD-10-CM | POA: Diagnosis not present

## 2018-04-12 LAB — POCT INR: INR: 2 (ref 2.0–3.0)

## 2018-04-14 DIAGNOSIS — R32 Unspecified urinary incontinence: Secondary | ICD-10-CM | POA: Diagnosis not present

## 2018-04-14 DIAGNOSIS — M6281 Muscle weakness (generalized): Secondary | ICD-10-CM | POA: Diagnosis not present

## 2018-04-14 DIAGNOSIS — M545 Low back pain: Secondary | ICD-10-CM | POA: Diagnosis not present

## 2018-04-25 ENCOUNTER — Ambulatory Visit: Payer: Medicare Other | Admitting: Internal Medicine

## 2018-04-25 ENCOUNTER — Encounter: Payer: Self-pay | Admitting: Internal Medicine

## 2018-04-25 VITALS — BP 114/62 | HR 90 | Ht 62.0 in | Wt 287.4 lb

## 2018-04-25 DIAGNOSIS — Z23 Encounter for immunization: Secondary | ICD-10-CM | POA: Diagnosis not present

## 2018-04-25 DIAGNOSIS — J4530 Mild persistent asthma, uncomplicated: Secondary | ICD-10-CM | POA: Diagnosis not present

## 2018-04-25 DIAGNOSIS — G4733 Obstructive sleep apnea (adult) (pediatric): Secondary | ICD-10-CM

## 2018-04-25 DIAGNOSIS — R0609 Other forms of dyspnea: Secondary | ICD-10-CM

## 2018-04-25 DIAGNOSIS — R0600 Dyspnea, unspecified: Secondary | ICD-10-CM

## 2018-04-25 DIAGNOSIS — I480 Paroxysmal atrial fibrillation: Secondary | ICD-10-CM

## 2018-04-25 NOTE — Assessment & Plan Note (Addendum)
Office spirometry shows severe obstructive airways disease and this never smoker.  Body habitus would be strongly consistent with obesity hypoventilation and deconditioning.  She has not been using her Advair appropriately. Plan-before changing meds we will try to get maximum benefit from her current Advair 250 by using it twice daily. Plan-on return consider Trelegy and assess oxygenation.  Flu vaccine senior given today.

## 2018-04-25 NOTE — Assessment & Plan Note (Signed)
Exam today would be consistent with sinus rhythm

## 2018-04-25 NOTE — Assessment & Plan Note (Signed)
We will try to pull information in.  Not clear when she restarted CPAP but I do not have download or settings.  Eventually we will need to try to get her reassessed for both sleep apnea and oxygen needs, and if necessary, get both modalities through a single company.

## 2018-04-25 NOTE — Patient Instructions (Addendum)
Order- flu vax senior  Order- office spirometry  Dx  Asthma mild persistent   Use your purple Advair disc   Inhale 1 puff, then rinse mouth, twice daily, every day- maintenance  Use CPAP all night, every night     Order- DME [facility] overnight oximety on CPAP    Dx OSA

## 2018-04-25 NOTE — Progress Notes (Signed)
Patient ID: Michele [name], female    DOB: [birth year], 67 y.o.   MRN: [id number]  HPI F never smoker. Followed here for obstructive sleep apnea, allergic rhinitis, asthma, complicated by obesity, GERD and CAD.  NPSG 07/16/16-AHI 56.9/hour, oxygen desaturation 80%, body weight 297 pounds Office Spirometry 04/25/2018-very severe obstructive airways disease-FVC 0.7/30%, FEV1 0.4/23%, ratio 0.60/77% ---------------------------------------------------------------------------------- 05/04/2016-67 year old female never smoker followed for OSA/failed CPAP, allergic rhinitis, asthma, Kidney by obesity, GERD, CAD/stents/warfarin/ AFib, CVA, chronic anemia, depression Allergy Vaccine 1:10 GH LOV-NP-03/16/2016 for episodic dyspnea/asthma exacerbation-Z-Pak, Mucinex DM CXR 03/16/2016-Cardiomegaly, NAD X-ray left rib details 05/03/2016-no definite abnormality Lab 03/16/16- hbg 9.9, BNP 197 FOLLOW FOR: allergies not doing well, sob, sneezing, runny nose. CPAP machine very old. Falling more often and bruising No acute change in her breathing but gets very little exercise.  04/25/18- 67 year old female never smoker followed for OSA/failed CPAP, allergic rhinitis, asthma, Kidney by obesity, GERD, CAD/stents/warfarin/ AFib, CVA, chronic anemia, depression NPSG 07/16/16-AHI 56.9/hour, oxygen desaturation 80%, body weight 297 pounds CPAP/ [facility] O2 2 L/[facility] -----OSA and Asthma: Pt notes she is wearing her CPAP nighlty but no DL at this time. Pt is currently have increased wheezing and SOB.  Body weight today 287 pounds Comes today with caregiver.  Has been using CPAP "sometimes".  Oxygen and CPAP are from different companies.  She has not been contacted by either in a year according to caregiver.  Oxygen is not bled in through CPAP and she seems to arbitrarily use one or the other, or nothing. She expresses concern about easy dyspnea and wheeze with exertion.  She has been trying to do water aerobics  twice a week.  Coughs some white mucus.  Denies chest pain, palpitation, blood, fever.  Using Advair 250 1 time daily, occasional albuterol rescue inhaler. Hematology has told her anemia is not causing dyspnea. Office Spirometry 04/25/2018-very severe obstructive airways disease-FVC 0.7/30%, FEV1 0.4/23%, ratio 0.60/77% CXR 09/24/2017 IMPRESSION: Low lung volumes. No active cardiopulmonary disease. No evidence of pneumonia or pulmonary edema.  ROS-see HPI + = positive Constitutional:   No-   weight loss, night sweats, fevers, chills, +fatigue, lassitude. HEENT:   No-  headaches, difficulty swallowing, tooth/dental problems, sore throat,       No-sneezing, itching, ear ache, nasal congestion, post nasal drip,  CV:  No-   chest pain, orthopnea, PND, swelling in lower extremities, anasarca, dizziness, palpitations Resp: +shortness of breath with exertion or at rest.            productive cough,  No non-productive cough,  No- coughing up of blood.              No-   change in color of mucus. No- wheezing.   Skin: No-   rash or lesions. GI:  No-   heartburn, indigestion, abdominal pain, nausea, vomiting,  GU:  MS:  No-   joint pain or swelling.   Neuro-     nothing unusual Psych:  No- change in mood or affect. No acute  depression or anxiety.  No memory loss.  Objective:  General- Alert, Oriented, Affect-appropriate, Distress- none acute; + morbidly obese Skin- . Lymphadenopathy- none Head- atraumatic            Eyes- Gross vision intact, PERRLA, conjunctivae clear secretions            Ears- Hearing, canals-normal            Nose- +clear, no- Septal dev,  polyps, erosion, perforation  Throat- Mallampati III-IV , mucosa clear , drainage- none, tonsils- atrophic; raspy voice Neck- flexible , trachea midline, no stridor , thyroid nl, carotid no bruit Chest - symmetrical excursion , unlabored           Heart/CV- RRR- occ extra beat , no murmur , no gallop  , no rub, nl s1 s2                            - JVD- none , edema- none, stasis changes- none, varices- none           Lung- clear, shallow, wheeze- none, cough- none , dullness-none, rub- none           Chest wall-  Abd-  Br/ Gen/ Rectal- Not done, not indicated Extrem- cyanosis- none, clubbing, none, atrophy- none, strength- nl; +walks with cane Neuro- grossly intact to observation  Assessment & Plan:

## 2018-05-05 DIAGNOSIS — R32 Unspecified urinary incontinence: Secondary | ICD-10-CM | POA: Diagnosis not present

## 2018-05-05 DIAGNOSIS — M545 Low back pain: Secondary | ICD-10-CM | POA: Diagnosis not present

## 2018-05-05 DIAGNOSIS — M6281 Muscle weakness (generalized): Secondary | ICD-10-CM | POA: Diagnosis not present

## 2018-05-10 ENCOUNTER — Ambulatory Visit: Payer: Medicare Other | Admitting: Pharmacist Clinician (PhC)/ Clinical Pharmacy Specialist

## 2018-05-10 DIAGNOSIS — Z7901 Long term (current) use of anticoagulants: Secondary | ICD-10-CM | POA: Diagnosis not present

## 2018-05-10 DIAGNOSIS — I480 Paroxysmal atrial fibrillation: Secondary | ICD-10-CM

## 2018-05-10 LAB — POCT INR: INR: 2.6 (ref 2.0–3.0)

## 2018-05-15 ENCOUNTER — Other Ambulatory Visit: Payer: Self-pay | Admitting: Cardiovascular Disease

## 2018-05-16 ENCOUNTER — Encounter (HOSPITAL_COMMUNITY): Payer: Self-pay | Admitting: Psychiatry

## 2018-05-16 ENCOUNTER — Ambulatory Visit (INDEPENDENT_AMBULATORY_CARE_PROVIDER_SITE_OTHER): Payer: Medicare Other | Admitting: Psychiatry

## 2018-05-16 VITALS — BP 116/73 | HR 74 | Ht 62.0 in | Wt 286.0 lb

## 2018-05-16 DIAGNOSIS — F331 Major depressive disorder, recurrent, moderate: Secondary | ICD-10-CM | POA: Diagnosis not present

## 2018-05-16 DIAGNOSIS — F4310 Post-traumatic stress disorder, unspecified: Secondary | ICD-10-CM | POA: Diagnosis not present

## 2018-05-16 MED ORDER — ARIPIPRAZOLE 5 MG PO TABS: 5.0000 mg | ORAL_TABLET | Freq: Every day | ORAL | 0 refills | Status: DC

## 2018-05-16 MED ORDER — SERTRALINE HCL 100 MG PO TABS: 200.0000 mg | ORAL_TABLET | Freq: Every day | ORAL | 0 refills | Status: DC
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
## 2018-05-18 ENCOUNTER — Ambulatory Visit
Admission: RE | Admit: 2018-05-18 | Discharge: 2018-05-18 | Disposition: A | Discharge status: Home / Self Care | Payer: Medicare Other | Source: Ambulatory Visit | Attending: Internal Medicine | Admitting: Internal Medicine

## 2018-05-18 DIAGNOSIS — E2839 Other primary ovarian failure: Secondary | ICD-10-CM

## 2018-05-18 DIAGNOSIS — Z1231 Encounter for screening mammogram for malignant neoplasm of breast: Secondary | ICD-10-CM

## 2018-05-18 DIAGNOSIS — Z780 Asymptomatic menopausal state: Secondary | ICD-10-CM | POA: Diagnosis not present

## 2018-05-18 DIAGNOSIS — Z13820 Encounter for screening for osteoporosis: Secondary | ICD-10-CM | POA: Diagnosis not present

## 2018-05-19 DIAGNOSIS — M545 Low back pain: Secondary | ICD-10-CM | POA: Diagnosis not present

## 2018-05-19 DIAGNOSIS — R32 Unspecified urinary incontinence: Secondary | ICD-10-CM | POA: Diagnosis not present

## 2018-05-19 DIAGNOSIS — M6281 Muscle weakness (generalized): Secondary | ICD-10-CM | POA: Diagnosis not present

## 2018-05-25 ENCOUNTER — Ambulatory Visit: Payer: Medicare Other | Admitting: Nurse Practitioner

## 2018-05-25 ENCOUNTER — Encounter: Payer: Self-pay | Admitting: Nurse Practitioner

## 2018-05-25 ENCOUNTER — Other Ambulatory Visit (HOSPITAL_COMMUNITY)
Admission: RE | Admit: 2018-05-25 | Discharge: 2018-05-25 | Disposition: A | Discharge status: Home / Self Care | Payer: Medicare Other | Source: Ambulatory Visit | Attending: Nurse Practitioner | Admitting: Nurse Practitioner

## 2018-05-25 VITALS — BP 102/74 | HR 74 | Temp 98.3°F | Ht 61.0 in | Wt 285.0 lb

## 2018-05-25 DIAGNOSIS — Z0000 Encounter for general adult medical examination without abnormal findings: Secondary | ICD-10-CM | POA: Diagnosis not present

## 2018-05-25 DIAGNOSIS — Z01419 Encounter for gynecological examination (general) (routine) without abnormal findings: Secondary | ICD-10-CM | POA: Insufficient documentation

## 2018-05-25 DIAGNOSIS — N761 Subacute and chronic vaginitis: Secondary | ICD-10-CM

## 2018-05-25 DIAGNOSIS — R0981 Nasal congestion: Secondary | ICD-10-CM

## 2018-05-25 DIAGNOSIS — Z1272 Encounter for screening for malignant neoplasm of vagina: Secondary | ICD-10-CM

## 2018-05-25 DIAGNOSIS — I10 Essential (primary) hypertension: Secondary | ICD-10-CM

## 2018-05-25 LAB — POCT URINALYSIS DIPSTICK
GLUCOSE UA: NEGATIVE
Ketones, UA: NEGATIVE
LEUKOCYTES UA: NEGATIVE
Nitrite, UA: NEGATIVE
Protein, UA: POSITIVE — AB
Spec Grav, UA: >=1.03 — AB (ref 1.010–1.025)
Urobilinogen, UA: 0.2 E.U./dL
pH, UA: 5 (ref 5.0–8.0)

## 2018-05-25 LAB — POCT UA - MICROALBUMIN
Creatinine, POC: 300 mg/dL
MICROALBUMIN (UR) POC: 80 mg/L

## 2018-05-25 NOTE — Patient Instructions (Signed)

## 2018-05-25 NOTE — Progress Notes (Addendum)
Subjective:     Patient ID: Michele [name] , female    DOB: 1950/08/21 , 67 y.o.   MRN: [id number]   Chief Complaint  Patient presents with  . Annual Exam    HPI The patient states she uses post menopausal status for birth control. Last LMP was No LMP recorded. Patient is postmenopausal.. Negative for Dysmenorrhea and Negative for Menorrhagia Mammogram last done 05/18/18 Negative for: breast discharge, breast lump(s), breast pain and breast self exam.  Pertinent negatives include abnormal bleeding (hematology), anxiety, decreased libido, depression, difficulty falling sleep, dyspareunia, history of infertility, nocturia, sexual dysfunction, sleep disturbances, urinary incontinence, urinary urgency, vaginal discharge and vaginal itching. Diet regular.The patient states her exercise level is  none  . The patient's tobacco use is:  Social History   Tobacco Use  Smoking Status Never Smoker  Smokeless Tobacco Never Used  . She has been exposed to passive smoke. The patient's alcohol use is:  Social History   Substance and Sexual Activity  Alcohol Use No  . Alcohol/week: 0.0 standard drinks  . Additional information: Last pap unknown, next one scheduled for 2019.  HPI   Past Medical History:  Diagnosis Date  . Allergic rhinitis   . ARF (acute renal failure) ([place]) 03/2017  . 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 Asthma   . CAD (coronary artery disease)   . CAD (coronary artery disease) 01/30/2007   stents trivial coronary artery disease diffusely, with a recent deployment od a intracoronary artery stent, 3.5 x 12 mm driver stent with no more than 20-30% in- stents restenosis.done by Dr [name] with re-look on novenber 10 2008 revealing a widely patent stent with otherwise trival CAD and normal LV function  . Carpal tunnel syndrome, right   . Chest pain 07/17/12009   2 D Echo EF >55%  . Complication of anesthesia    slow to wake up  . Depression   . Diabetes  mellitus type II   . Gait instability   . History of kidney stones   . Hyperlipidemia   . Hypertension   . Iron deficiency anemia 07/13/2016  . Obesity   . Paroxysmal A-fib ([place])   . PTSD (post-traumatic stress disorder)   . Sleep apnea    cpap     Family History  Problem Relation Age of Onset  . Coronary artery disease Mother   . Coronary artery disease Father   . Depression Father      Current Outpatient Medications:  .  ADVAIR DISKUS 250-50 MCG/DOSE AEPB, INHALE ONE PUFF TWICE DAILY, Disp: 60 each, Rfl: 5 .  albuterol (VENTOLIN HFA) 108 (90 Base) MCG/ACT inhaler, Inhale 1 puff into the lungs every 6 (six) hours as needed for wheezing or shortness of breath. (Patient taking differently: Inhale 2 puffs into the lungs every 6 (six) hours as needed for wheezing or shortness of breath. ), Disp: 18 g, Rfl: 4 .  ARIPiprazole (ABILIFY) 5 MG tablet, Take 1 tablet (5 mg total) by mouth daily., Disp: 90 tablet, Rfl: 0 .  gabapentin (NEURONTIN) 300 MG capsule, Take 300 mg by mouth 3 (three) times daily as needed (for pain). , Disp: , Rfl: 0 .  MAGNESIUM GLUCONATE PO, Take by mouth., Disp: , Rfl:  .  metFORMIN (GLUCOPHAGE-XR) 500 MG 24 hr tablet, TAKE ONE (1) TABLET BY MOUTH EVERY DAY WITH BREAKFAST, Disp: 90 tablet, Rfl: 3 .  metoprolol tartrate (LOPRESSOR) 25 MG tablet, Take 1 tablet (25 mg  total) by mouth 2 (two) times daily., Disp: 180 tablet, Rfl: 3 .  nitroGLYCERIN (NITROSTAT) 0.4 MG SL tablet, PLACE 1 TABLET UNDER THE TONGUE EVERY 5 MINUTES AS NEEDED FOR CHEST PAIN.  MAY REPEAT 3 TIMES.  IF NO RELIEF CALL DOCTOR, Disp: 25 tablet, Rfl: 11 .  OXYGEN, Inhale 2 L into the lungs as needed (for shortness of breath). , Disp: , Rfl:  .  pantoprazole (PROTONIX) 40 MG tablet, Take 1 tablet (40 mg total) by mouth 2 (two) times daily before a meal. Take twice a day before meals for 8 weeks, then once a day (Patient taking differently: Take 40 mg by mouth See admin instructions. Take 40 mg by mouth  twice daily, starting 05/01 take 40 mg by mouth once daily), Disp: 60 tablet, Rfl: 4 .  rosuvastatin (CRESTOR) 10 MG tablet, TAKE ONE (1) TABLET BY MOUTH EVERY DAY (Patient taking differently: TAKE ONE (1) TABLET (10 MG)  BY MOUTH EVERY DAY), Disp: 90 tablet, Rfl: 1 .  sertraline (ZOLOFT) 100 MG tablet, Take 2 tablets (200 mg total) by mouth daily., Disp: 180 tablet, Rfl: 0 .  trospium (SANCTURA) 20 MG tablet, trospium, Disp: , Rfl:  .  warfarin (COUMADIN) 7.5 MG tablet, TAKE ONE AND ONE-HALF TO TWO TABLETS BY MOUTH DAILY AS DIRECTED, Disp: 150 tablet, Rfl: 0   Allergies  Allergen Reactions  . Meprobamate Nausea And Vomiting     Review of Systems  Constitutional: Negative.   HENT: Positive for congestion. Negative for sinus pressure, sinus pain, sore throat and tinnitus.   Eyes: Negative.   Respiratory: Negative.   Cardiovascular: Negative.   Gastrointestinal: Positive for abdominal pain. Negative for constipation, nausea and vomiting.  Endocrine: Negative.  Negative for polydipsia, polyphagia and polyuria.  Genitourinary: Negative for frequency and vaginal discharge.       Vaginal discharge 6 months ago thought to be yeast and treated with monistat.  Musculoskeletal: Negative.   Skin: Negative.   Neurological: Negative.   Hematological: Negative.   Psychiatric/Behavioral: Negative.      Today's Vitals   05/25/18 1041  BP: 102/74  Pulse: 74  Temp: 98.3 F (36.8 C)  TempSrc: Oral  SpO2: 91%  Weight: 285 lb (129.3 kg)  Height: 5\' 1"  (1.549 m)  PainSc: 4    Body mass index is 53.85 kg/m.   Objective:  Physical Exam  Constitutional: Vital signs are normal. She appears well-developed and well-nourished.  HENT:  Head: Normocephalic and atraumatic.  Eyes: Pupils are equal, round, and reactive to light. Conjunctivae, EOM and lids are normal.  Neck: Trachea normal and full passive range of motion without pain. No JVD present. Carotid bruit is not present. No thyroid mass and  no thyromegaly present.  Cardiovascular: Normal rate, regular rhythm, S1 normal, S2 normal, normal heart sounds and intact distal pulses.  Pulmonary/Chest: Breath sounds normal. She is in respiratory distress. She has no wheezes.  Abdominal: Soft. Normal appearance and bowel sounds are normal.  Genitourinary: Pelvic exam was performed with patient supine. There is no rash or tenderness on the right labia. There is no rash or tenderness on the left labia.  Neurological: She is alert. She has normal strength and normal reflexes. No cranial nerve deficit or sensory deficit.  Skin: Skin is warm, dry and intact. Capillary refill takes less than 2 seconds.  Psychiatric: She has a normal mood and affect. Her speech is normal and behavior is normal. Judgment and thought content normal. Cognition and memory are normal.  Assessment And Plan:     1. Health maintenance examination . Behavior modifications discussed and diet history reviewed.   . Pt will continue to exercise regularly and modify diet with low GI, plant based foods and decrease intake of processed foods.  . Recommend intake of daily multivitamin, Vitamin D, and calcium.  . Recommend mammogram and colonoscopy for preventive screenings, as well as recommend immunizations that include influenza, TDAP, and Shingles  2. Encounter for Papanicolaou smear of vagina as part of routine gynecological examination  Normal vaginal exam - Cytology -Pap Smear  3. Essential hypertension  Chronic, good control  Continue with current medications - EKG 12-Lead  4. Chronic vaginitis  No vaginal discharge noted  Will check for STD's particularly bacterial vaginosis - Cervicovaginal ancillary only  5. Head congestion  2 day history of head congestion  Encouraged to use nasal spray and antihistamine before treating more aggressively for any type of sinus infection       Minette Brine, FNP

## 2018-05-29 LAB — CERVICOVAGINAL ANCILLARY ONLY
Bacterial vaginitis: NEGATIVE
Chlamydia: NEGATIVE
Neisseria Gonorrhea: NEGATIVE
TRICH (WINDOWPATH): NEGATIVE

## 2018-05-30 ENCOUNTER — Telehealth: Payer: Self-pay

## 2018-05-30 LAB — CYTOLOGY - PAP: Diagnosis: NEGATIVE

## 2018-05-30 NOTE — Telephone Encounter (Signed)
Patient lvm stating she saw you on Friday and her cough has progressed, it is a more productive cough she wouls like something to please be sent to the pharmacy.

## 2018-06-01 ENCOUNTER — Other Ambulatory Visit: Payer: Self-pay

## 2018-06-01 MED ORDER — AMOXICILLIN 500 MG PO TABS: 500.0000 mg | ORAL_TABLET | Freq: Two times a day (BID) | ORAL | 0 refills | Status: AC

## 2018-06-02 ENCOUNTER — Telehealth: Payer: Self-pay | Admitting: Cardiovascular Disease

## 2018-06-02 NOTE — Telephone Encounter (Signed)
New Message:    Patient caregiver called to inform the Doctor that she is on antibiotic as of today. Just wanted the doctor to be aware. Please call [phone]

## 2018-06-02 NOTE — Telephone Encounter (Signed)
Routed to pharmD to make aware.  Per chart review: patient prescribed amoxicillin 500 mg BID x 7 days.   Patient on warfarin.

## 2018-06-02 NOTE — Telephone Encounter (Signed)
Okay to take amoxicillin as prescribed. No significant change expected in INR. Keep appt with coumadin clinic as previously schedule for Nov/20/2019

## 2018-06-05 NOTE — Telephone Encounter (Signed)
Spoke to daughter. Information given okay to use amoxicillin and keep appointment with [facility].

## 2018-06-06 ENCOUNTER — Encounter: Payer: Self-pay | Admitting: Nurse Practitioner

## 2018-06-07 ENCOUNTER — Ambulatory Visit: Payer: Medicare Other | Admitting: Pharmacist Clinician (PhC)/ Clinical Pharmacy Specialist

## 2018-06-07 DIAGNOSIS — Z7901 Long term (current) use of anticoagulants: Secondary | ICD-10-CM | POA: Diagnosis not present

## 2018-06-07 DIAGNOSIS — I480 Paroxysmal atrial fibrillation: Secondary | ICD-10-CM

## 2018-06-07 LAB — POCT INR: INR: 4.6 — AB (ref 2.0–3.0)

## 2018-06-07 NOTE — Patient Instructions (Signed)
Description   No warfarin Wednesday Nov 20 then only 1/2 tablet Thursday Nov 21.  After that continue taking 1 tablet daily except 1.5 tablets each Sunday, Tuesday and Thursday.  Repeat INR in 2 weeks

## 2018-06-20 ENCOUNTER — Ambulatory Visit: Payer: Medicare Other | Admitting: Pharmacist Clinician (PhC)/ Clinical Pharmacy Specialist

## 2018-06-20 DIAGNOSIS — I480 Paroxysmal atrial fibrillation: Secondary | ICD-10-CM

## 2018-06-20 DIAGNOSIS — Z7901 Long term (current) use of anticoagulants: Secondary | ICD-10-CM | POA: Diagnosis not present

## 2018-06-20 LAB — POCT INR: INR: 1.5 — AB (ref 2.0–3.0)

## 2018-06-20 NOTE — Patient Instructions (Signed)
Description   Take 2 tablets today Tuesday Dec 3, then continue taking 1 tablet daily except 1.5 tablets each Sunday, Tuesday and Thursday.  Repeat INR in 2 weeks

## 2018-06-21 ENCOUNTER — Ambulatory Visit (INDEPENDENT_AMBULATORY_CARE_PROVIDER_SITE_OTHER): Payer: Medicare Other | Admitting: Licensed Clinical Social Worker

## 2018-06-21 ENCOUNTER — Encounter (HOSPITAL_COMMUNITY): Payer: Self-pay | Admitting: Licensed Clinical Social Worker

## 2018-06-21 DIAGNOSIS — F4310 Post-traumatic stress disorder, unspecified: Secondary | ICD-10-CM

## 2018-06-21 DIAGNOSIS — F331 Major depressive disorder, recurrent, moderate: Secondary | ICD-10-CM
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
## 2018-06-21 NOTE — Progress Notes (Signed)
   THERAPIST PROGRESS NOTE  Session Time: 10:00am-11:00am  Participation Level: Active  Behavioral Response: Well GroomedAlertDepressed  Type of Therapy: Individual Therapy  Treatment Goals addressed: Diagnosis: MDD recurrent, moderate and PTSD  Interventions: CBT and Motivational Interviewing  Summary: Michele [name] is a 68 y.o. female who presents with MDD recurrent, moderate and PTSD   Suicidal/Homicidal: Nowithout intent/plan  Therapist Response: Michele [name] engaged well in updated CCA. She reported that she continues to feel depressed and have some PTSD flashbacks, especially around this time of year. Michele [name] reports difficulty sleeping, memory and concentration problems, and sadness much of the time. However, she denies any suicidality or thoughts of self-injury. She reports she does not want to be a burden to her daughter, with whom she lives.   Plan: Return again in 4-5 weeks.  Diagnosis: Axis I: MDD, recurrent, moderate and PTSD   Mindi Curling, LCSW 06/21/2018

## 2018-06-26 ENCOUNTER — Encounter: Payer: Self-pay | Admitting: Internal Medicine

## 2018-06-26 ENCOUNTER — Ambulatory Visit: Payer: Medicare Other | Admitting: Internal Medicine

## 2018-06-26 ENCOUNTER — Telehealth: Payer: Self-pay | Admitting: Internal Medicine

## 2018-06-26 VITALS — BP 128/74 | HR 86 | Temp 98.8°F | Ht 61.0 in | Wt 283.0 lb

## 2018-06-26 DIAGNOSIS — G4733 Obstructive sleep apnea (adult) (pediatric): Secondary | ICD-10-CM

## 2018-06-26 DIAGNOSIS — R0609 Other forms of dyspnea: Secondary | ICD-10-CM | POA: Diagnosis not present

## 2018-06-26 DIAGNOSIS — J441 Chronic obstructive pulmonary disease with (acute) exacerbation: Secondary | ICD-10-CM | POA: Diagnosis not present

## 2018-06-26 DIAGNOSIS — R0600 Dyspnea, unspecified: Secondary | ICD-10-CM

## 2018-06-26 MED ORDER — AMOXICILLIN-POT CLAVULANATE 875-125 MG PO TABS: 1.0000 | ORAL_TABLET | Freq: Two times a day (BID) | ORAL | 0 refills | Status: DC

## 2018-06-26 MED ORDER — LEVALBUTEROL HCL 0.63 MG/3ML IN NEBU
0.6300 mg | INHALATION_SOLUTION | Freq: Once | RESPIRATORY_TRACT | Status: AC
  Administered 2018-06-26: 0.63 mg via RESPIRATORY_TRACT

## 2018-06-26 MED ORDER — IPRATROPIUM-ALBUTEROL 0.5-2.5 (3) MG/3ML IN SOLN: 3.0000 mL | Freq: Four times a day (QID) | RESPIRATORY_TRACT | 12 refills | Status: DC | PRN

## 2018-06-26 NOTE — Patient Instructions (Signed)
Script sent for augmentin antibiotic  Script sent changing your nebulizer solution to Duoneb (ipratropium- albuterol)   Use 1 ampule in nebulizer every 6 hours if needed  Please call if we can help

## 2018-06-26 NOTE — Telephone Encounter (Signed)
Called and spoke with [facility] and they verified what was needed on new Rx since nebulizer solution is approved but can only be filled with original Rx with diagnosis code. New order placed  Nothing further needed

## 2018-06-26 NOTE — Progress Notes (Signed)
Patient ID: Michele [name], female    DOB: [birth year], 67 y.o.   MRN: [id number]  HPI F never smoker. Followed here for obstructive sleep apnea, allergic rhinitis, Asthma/COPD complicated by obesity/hypotension, GERD and CAD.  NPSG 07/16/16-AHI 56.9/hour, oxygen desaturation 80%, body weight 297 pounds Office Spirometry 04/25/2018-very severe obstructive airways disease-FVC 0.7/30%, FEV1 0.4/23%, ratio 0.60/77% ---------------------------------------------------------------------------------- 04/25/18- 67 year old female never smoker followed for OSA/failed CPAP, allergic rhinitis, asthma, Kidney by obesity, GERD, CAD/stents/warfarin/ AFib, CVA, chronic anemia, depression NPSG 07/16/16-AHI 56.9/hour, oxygen desaturation 80%, body weight 297 pounds CPAP/ [facility] O2 2 L/[facility] -----OSA and Asthma: Pt notes she is wearing her CPAP nighlty but no DL at this time. Pt is currently have increased wheezing and SOB.  Body weight today 287 pounds Comes today with caregiver.  Has been using CPAP "sometimes".  Oxygen and CPAP are from different companies.  She has not been contacted by either in a year according to caregiver.  Oxygen is not bled in through CPAP and she seems to arbitrarily use one or the other, or nothing. She expresses concern about easy dyspnea and wheeze with exertion.  She has been trying to do water aerobics twice a week.  Coughs some white mucus.  Denies chest pain, palpitation, blood, fever.  Using Advair 250 1 time daily, occasional albuterol rescue inhaler. Hematology has told her anemia is not causing dyspnea. Office Spirometry 04/25/2018-very severe obstructive airways disease-FVC 0.7/30%, FEV1 0.4/23%, ratio 0.60/77% CXR 09/24/2017 IMPRESSION: Low lung volumes. No active cardiopulmonary disease. No evidence of pneumonia or pulmonary edema.  06/26/2018- 67 year old female never smoker followed for OSA/failed CPAP, allergic rhinitis, Asthma/COPD, complicated by  obesity/hypoventilation, GERD, CAD/stents/warfarin/ AFib, CVA, chronic anemia, depression, DM 2, HBP, O2 2 L last [facility] CPAP/[facility] -not using -----pt c/o increased sob with any exertion, prod cough with brown mucus X2 mos.   Body weight today 283 pounds Advair 250, albuterol HFA, Nebulizer twice daily.  Reports cough productive thick phlegm, light brown.  Recent amoxicillin.  Bad choking cough.  Followed for anemia by Heme/Onc.  Nebulizer machine but is out of albuterol.  ROS-see HPI + = positive Constitutional:   No-   weight loss, night sweats, fevers, chills, +fatigue, lassitude. HEENT:   No-  headaches, difficulty swallowing, tooth/dental problems, sore throat,       No-sneezing, itching, ear ache, nasal congestion, post nasal drip,  CV:  No-   chest pain, orthopnea, PND, swelling in lower extremities, anasarca, dizziness, palpitations Resp: +shortness of breath with exertion or at rest.            +productive cough,  No non-productive cough,  No- coughing up of blood.             + change in color of mucus. No- wheezing.   Skin: No-   rash or lesions. GI:  No-   heartburn, indigestion, abdominal pain, nausea, vomiting,  GU:  MS:  No-   joint pain or swelling.   Neuro-     nothing unusual Psych:  No- change in mood or affect. No acute  depression or anxiety.  No memory loss.  Objective:  General- Alert, Oriented, Affect-appropriate, Distress- none acute; + morbidly obese + wheelchair Skin- . Lymphadenopathy- none Head- atraumatic            Eyes- Gross vision intact, PERRLA, conjunctivae clear secretions            Ears- Hearing, canals-normal            Nose- +clear,  no- Septal dev,  polyps, erosion, perforation             Throat- Mallampati III-IV , mucosa clear , drainage- none, tonsils- atrophic; raspy voice Neck- flexible , trachea midline, no stridor , thyroid nl, carotid no bruit Chest - symmetrical excursion , unlabored           Heart/CV- RRR- occ extra beat , no  murmur , no gallop  , no rub, nl s1 s2                           - JVD- none , edema- none, stasis changes- none, varices- none           Lung-  shallow, wheeze+ trace, cough- none , dullness-none, rub- none           Chest wall-  Abd-  Br/ Gen/ Rectal- Not done, not indicated Extrem- cyanosis- none, clubbing, none, atrophy- none, strength- nl; +walks with cane Neuro- grossly intact to observation  Assessment & Plan:

## 2018-06-27 ENCOUNTER — Other Ambulatory Visit: Payer: Self-pay | Admitting: Internal Medicine

## 2018-06-27 ENCOUNTER — Other Ambulatory Visit: Payer: Self-pay

## 2018-06-27 MED ORDER — IPRATROPIUM-ALBUTEROL 0.5-2.5 (3) MG/3ML IN SOLN: 3.0000 mL | Freq: Four times a day (QID) | RESPIRATORY_TRACT | 12 refills | Status: DC | PRN

## 2018-06-28 ENCOUNTER — Telehealth: Payer: Self-pay

## 2018-06-28 ENCOUNTER — Telehealth: Payer: Self-pay | Admitting: Internal Medicine

## 2018-06-28 ENCOUNTER — Inpatient Hospital Stay (HOSPITAL_BASED_OUTPATIENT_CLINIC_OR_DEPARTMENT_OTHER): Payer: Medicare Other | Admitting: Internal Medicine

## 2018-06-28 ENCOUNTER — Encounter: Payer: Self-pay | Admitting: Internal Medicine

## 2018-06-28 ENCOUNTER — Inpatient Hospital Stay: Payer: Medicare Other | Attending: Internal Medicine

## 2018-06-28 ENCOUNTER — Other Ambulatory Visit: Payer: Self-pay | Admitting: Internal Medicine

## 2018-06-28 VITALS — BP 119/75 | HR 77 | Temp 98.7°F | Resp 18 | Ht 61.0 in | Wt 283.8 lb

## 2018-06-28 DIAGNOSIS — D508 Other iron deficiency anemias: Secondary | ICD-10-CM | POA: Insufficient documentation

## 2018-06-28 DIAGNOSIS — I10 Essential (primary) hypertension: Secondary | ICD-10-CM | POA: Diagnosis not present

## 2018-06-28 DIAGNOSIS — Z79899 Other long term (current) drug therapy: Secondary | ICD-10-CM | POA: Diagnosis not present

## 2018-06-28 DIAGNOSIS — D509 Iron deficiency anemia, unspecified: Secondary | ICD-10-CM

## 2018-06-28 LAB — CBC WITH DIFFERENTIAL (CANCER CENTER ONLY)
Abs Immature Granulocytes: 0.01 10*3/uL (ref 0.00–0.07)
Basophils Absolute: 0.1 10*3/uL (ref 0.0–0.1)
Basophils Relative: 1 %
EOS PCT: 10 %
Eosinophils Absolute: 0.6 10*3/uL — ABNORMAL HIGH (ref 0.0–0.5)
HEMATOCRIT: 40 % (ref 36.0–46.0)
HEMOGLOBIN: 11.3 g/dL — AB (ref 12.0–15.0)
Immature Granulocytes: 0 %
Lymphocytes Relative: 29 %
Lymphs Abs: 1.7 10*3/uL (ref 0.7–4.0)
MCH: 20.8 pg — AB (ref 26.0–34.0)
MCHC: 28.3 g/dL — ABNORMAL LOW (ref 30.0–36.0)
MCV: 73.7 fL — ABNORMAL LOW (ref 80.0–100.0)
MONOS PCT: 8 %
Monocytes Absolute: 0.5 10*3/uL (ref 0.1–1.0)
Neutro Abs: 3.1 10*3/uL (ref 1.7–7.7)
Neutrophils Relative %: 52 %
Platelet Count: 298 10*3/uL (ref 150–400)
RBC: 5.43 MIL/uL — ABNORMAL HIGH (ref 3.87–5.11)
RDW: 19.5 % — ABNORMAL HIGH (ref 11.5–15.5)
WBC Count: 5.9 10*3/uL (ref 4.0–10.5)
nRBC: 0 % (ref 0.0–0.2)

## 2018-06-28 LAB — FERRITIN: Ferritin: 422 ng/mL — ABNORMAL HIGH (ref 11–307)

## 2018-06-28 LAB — IRON AND TIBC
Iron: 66 ug/dL (ref 41–142)
Saturation Ratios: 24 % (ref 21–57)
TIBC: 280 ug/dL (ref 236–444)
UIBC: 214 ug/dL (ref 120–384)

## 2018-06-28 MED ORDER — INTEGRA PLUS PO CAPS: 1.0000 | ORAL_CAPSULE | Freq: Every morning | ORAL | 2 refills | Status: DC

## 2018-06-28 NOTE — Telephone Encounter (Signed)
She stated she would call but insisted I put a message back for the nurse to call them also.

## 2018-06-28 NOTE — Progress Notes (Signed)
[place] [phone]   [phone]  OFFICE PROGRESS NOTE  [name], [address] [place] [address]  DIAGNOSIS: Unspecified anemia questionable for anemia of chronic disease/iron deficiency.   PRIOR THERAPY: Status post Feraheme infusion 510 mg IV weekly on as-needed basis.  Last dose was given August 12, 2017.  CURRENT THERAPY: Integra +1 capsule p.o. daily.  INTERVAL HISTORY: Michele [name] 67 y.o. female returns to the clinic today for follow-up visit accompanied by her daughter.  The patient is feeling fine today with no concerning complaints except for mild fatigue.  She denied having any bleeding issues.  She has no nausea, vomiting, diarrhea or constipation.  She denied having any chest pain but continues to have shortness of breath with exertion with no cough or hemoptysis.  She has no headache or visual changes.  She has been on observation since her iron infusion and January 2019.  She is here today for evaluation and repeat CBC, iron study and ferritin.   MEDICAL HISTORY: Past Medical History:  Diagnosis Date  . Allergic rhinitis   . ARF (acute renal failure) ([place]) 03/2017  . 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 Asthma   . CAD (coronary artery disease)   . CAD (coronary artery disease) 01/30/2007   stents trivial coronary artery disease diffusely, with a recent deployment od a intracoronary artery stent, 3.5 x 12 mm driver stent with no more than 20-30% in- stents restenosis.done by Dr [name] with re-look on novenber 10 2008 revealing a widely patent stent with otherwise trival CAD and normal LV function  . Carpal tunnel syndrome, right   . Chest pain 07/17/12009   2 D Echo EF >55%  . Complication of anesthesia    slow to wake up  . Depression   . Diabetes mellitus type II   . Gait instability   . History of kidney stones   . Hyperlipidemia   . Hypertension   . Iron  deficiency anemia 07/13/2016  . Obesity   . Paroxysmal A-fib ([place])   . PTSD (post-traumatic stress disorder)   . Sleep apnea    cpap    ALLERGIES:  is allergic to meprobamate.  MEDICATIONS:  Current Outpatient Medications  Medication Sig Dispense Refill  . ADVAIR DISKUS 250-50 MCG/DOSE AEPB INHALE ONE PUFF TWICE DAILY 60 each 5  . albuterol (VENTOLIN HFA) 108 (90 Base) MCG/ACT inhaler Inhale 1 puff into the lungs every 6 (six) hours as needed for wheezing or shortness of breath. (Patient taking differently: Inhale 2 puffs into the lungs every 6 (six) hours as needed for wheezing or shortness of breath. ) 18 g 4  . amoxicillin-clavulanate (AUGMENTIN) 875-125 MG tablet Take 1 tablet by mouth 2 (two) times daily. 14 tablet 0  . ARIPiprazole (ABILIFY) 5 MG tablet Take 1 tablet (5 mg total) by mouth daily. 90 tablet 0  . gabapentin (NEURONTIN) 300 MG capsule Take 300 mg by mouth 3 (three) times daily as needed (for pain).   0  . ipratropium-albuterol (DUONEB) 0.5-2.5 (3) MG/3ML SOLN USE 3 ML IN NEBULIZER EVERY 6 HOURS AS NEEDED 360 mL 12  . MAGNESIUM GLUCONATE PO Take by mouth.    . metFORMIN (GLUCOPHAGE-XR) 500 MG 24 hr tablet TAKE ONE (1) TABLET BY MOUTH EVERY DAY WITH BREAKFAST 90 tablet 3  . metoprolol tartrate (LOPRESSOR) 25 MG tablet Take 1 tablet (25 mg total) by mouth 2 (two)  times daily. 180 tablet 3  . nitroGLYCERIN (NITROSTAT) 0.4 MG SL tablet PLACE 1 TABLET UNDER THE TONGUE EVERY 5 MINUTES AS NEEDED FOR CHEST PAIN.  MAY REPEAT 3 TIMES.  IF NO RELIEF CALL DOCTOR 25 tablet 11  . OXYGEN Inhale 2 L into the lungs as needed (for shortness of breath).     . pantoprazole (PROTONIX) 40 MG tablet Take 1 tablet (40 mg total) by mouth 2 (two) times daily before a meal. Take twice a day before meals for 8 weeks, then once a day (Patient taking differently: Take 40 mg by mouth See admin instructions. Take 40 mg by mouth twice daily, starting 05/01 take 40 mg by mouth once daily) 60 tablet 4  .  rosuvastatin (CRESTOR) 10 MG tablet TAKE ONE (1) TABLET BY MOUTH EVERY DAY (Patient taking differently: TAKE ONE (1) TABLET (10 MG)  BY MOUTH EVERY DAY) 90 tablet 1  . sertraline (ZOLOFT) 100 MG tablet Take 2 tablets (200 mg total) by mouth daily. 180 tablet 0  . trospium (SANCTURA) 20 MG tablet trospium    . warfarin (COUMADIN) 7.5 MG tablet TAKE ONE AND ONE-HALF TO TWO TABLETS BY MOUTH DAILY AS DIRECTED 150 tablet 0   No current facility-administered medications for this visit.     SURGICAL HISTORY:  Past Surgical History:  Procedure Laterality Date  . cardiac stents    . CHOLECYSTECTOMY    . COLONOSCOPY WITH PROPOFOL N/A 11/18/2017   Procedure: COLONOSCOPY WITH PROPOFOL;  Surgeon: Carol Ada, MD;  Location: [facility];  Service: Endoscopy;  Laterality: N/A;  . ESOPHAGOGASTRODUODENOSCOPY N/A 09/25/2017   Procedure: ESOPHAGOGASTRODUODENOSCOPY (EGD);  Surgeon: Clarene Essex, MD;  Location: [facility];  Service: Endoscopy;  Laterality: N/A;  . GALLBLADDER SURGERY    . HEMORRHOID SURGERY    . Kidney stones    . Left groin cyst    . multilevel lumbar degenerative disc disease    . stress myocardial perfusion study  09/08/2010   normal; EF 75%    REVIEW OF SYSTEMS:  A comprehensive review of systems was negative except for: Constitutional: positive for fatigue Respiratory: positive for dyspnea on exertion   PHYSICAL EXAMINATION: General appearance: alert, cooperative, fatigued and no distress Head: Normocephalic, without obvious abnormality, atraumatic Neck: no adenopathy, no JVD, supple, symmetrical, trachea midline and thyroid not enlarged, symmetric, no tenderness/mass/nodules Lymph nodes: Cervical, supraclavicular, and axillary nodes normal. Resp: clear to auscultation bilaterally Back: symmetric, no curvature. ROM normal. No CVA tenderness. Cardio: regular rate and rhythm, S1, S2 normal, no murmur, click, rub or gallop GI: soft, non-tender; bowel sounds normal; no masses,  no  organomegaly Extremities: extremities normal, atraumatic, no cyanosis or edema  ECOG PERFORMANCE STATUS: 1 - Symptomatic but completely ambulatory  Blood pressure 119/75, pulse 77, temperature 98.7 F (37.1 C), temperature source Oral, resp. rate 18, height 5\' 1"  (1.549 m), weight 283 lb 12.8 oz (128.7 kg), SpO2 99 %.  LABORATORY DATA: Lab Results  Component Value Date   WBC 5.9 06/28/2018   HGB 11.3 (L) 06/28/2018   HCT 40.0 06/28/2018   MCV 73.7 (L) 06/28/2018   PLT 298 06/28/2018      Chemistry      Component Value Date/Time   NA 140 09/28/2017 0429   NA 144 10/02/2012 0905   K 3.5 09/28/2017 0429   K 3.4 (L) 10/02/2012 0905   CL 104 09/28/2017 0429   CL 101 10/02/2012 0905   CO2 28 09/28/2017 0429   CO2 31 (H) 10/02/2012 0240  BUN 12 09/28/2017 0429   BUN 13.9 10/02/2012 0905   CREATININE 0.71 09/28/2017 0429   CREATININE 0.77 12/08/2014 1153   CREATININE 0.8 10/02/2012 0905      Component Value Date/Time   CALCIUM 8.3 (L) 09/28/2017 0429   CALCIUM 9.9 10/02/2012 0905   ALKPHOS 61 09/24/2017 2009   AST 28 09/24/2017 2009   ALT 17 09/24/2017 2009   BILITOT 0.5 09/24/2017 2009      RADIOGRAPHIC STUDIES: No results found.  ASSESSMENT AND PLAN:  This is a very pleasant 67 years old African-American female with iron deficiency anemia plus/minus anemia of chronic disease status post Feraheme infusion. Her last Feraheme infusion was given on August 12, 2017.   She is currently on observation and feeling fine. Repeat CBC today showed mild anemia with hemoglobin of 11.3.  Iron study and ferritin are still pending. I recommended for the patient to start treatment with oral iron tablets with Integra +1 capsule p.o. daily. I will see her back for follow-up visit in 3 months for evaluation with repeat CBC, iron study and ferritin. She was advised to call immediately if she has any concerning symptoms in the interval. The patient voices understanding of current disease  status and treatment options and is in agreement with the current care plan. All questions were answered. The patient knows to call the clinic with any problems, questions or concerns. We can certainly see the patient much sooner if necessary. I spent 10 minutes counseling the patient face to face. The total time spent in the appointment was 15 minutes.  Disclaimer: This note was dictated with voice recognition software. Similar sounding words can inadvertently be transcribed and may not be corrected upon review.

## 2018-06-28 NOTE — Telephone Encounter (Signed)
Printed calendar and avs. °

## 2018-06-28 NOTE — Telephone Encounter (Signed)
Called patient unable to reach left message to give us a call back.

## 2018-06-28 NOTE — Telephone Encounter (Signed)
On a new antibiotic and needs advice on coumadin if needs to change dosage.

## 2018-06-28 NOTE — Telephone Encounter (Signed)
Patient currently taking augmentin bid x 7 days.  Previously had diarrhea on augmentin.  Advised HH aide that should she develop diarrhea on the antibiotic this time, she should hold warfarin x 1 day only.  Aide voiced understanding, they will keep appointment for Dec 23.

## 2018-06-29 DIAGNOSIS — J4520 Mild intermittent asthma, uncomplicated: Secondary | ICD-10-CM | POA: Diagnosis not present

## 2018-06-29 MED ORDER — IPRATROPIUM-ALBUTEROL 0.5-2.5 (3) MG/3ML IN SOLN: RESPIRATORY_TRACT | 5 refills | Status: AC

## 2018-06-29 NOTE — Telephone Encounter (Signed)
I called Michele [name] to let her know I spoke with pharmacy and they stated we needed to send in another RX with a DX code and no more than 6.reills on the RX. I sent in another Rx with the information requested and nothing further is needed.

## 2018-06-29 NOTE — Telephone Encounter (Signed)
lmom 

## 2018-06-29 NOTE — Telephone Encounter (Signed)
[facility] is calling back [phone]

## 2018-07-10 ENCOUNTER — Ambulatory Visit: Payer: Medicare Other | Admitting: Pharmacist

## 2018-07-10 DIAGNOSIS — Z7901 Long term (current) use of anticoagulants: Secondary | ICD-10-CM | POA: Diagnosis not present

## 2018-07-10 DIAGNOSIS — I480 Paroxysmal atrial fibrillation: Secondary | ICD-10-CM

## 2018-07-10 LAB — POCT INR: INR: 1.7 — AB (ref 2.0–3.0)

## 2018-07-11 DIAGNOSIS — M6281 Muscle weakness (generalized): Secondary | ICD-10-CM | POA: Diagnosis not present

## 2018-07-11 DIAGNOSIS — R32 Unspecified urinary incontinence: Secondary | ICD-10-CM | POA: Diagnosis not present

## 2018-07-11 DIAGNOSIS — M545 Low back pain: Secondary | ICD-10-CM | POA: Diagnosis not present

## 2018-07-24 ENCOUNTER — Ambulatory Visit (INDEPENDENT_AMBULATORY_CARE_PROVIDER_SITE_OTHER): Payer: Medicare Other | Admitting: Licensed Clinical Social Worker

## 2018-07-24 ENCOUNTER — Encounter (HOSPITAL_COMMUNITY): Payer: Self-pay | Admitting: Licensed Clinical Social Worker

## 2018-07-24 DIAGNOSIS — F331 Major depressive disorder, recurrent, moderate: Secondary | ICD-10-CM

## 2018-07-24 NOTE — Progress Notes (Signed)
   THERAPIST PROGRESS NOTE  Session Time: 11:00am-12:00pm  Participation Level: Active  Behavioral Response: NeatAlertAnxious  Type of Therapy: Individual Therapy  Treatment Goals addressed: Improve Psychiatric Symptoms, elevate mood (increased self-esteem, increased self-compassion, increased interaction), improve unhelpful thought patterns, controlled behavior, moderate mood, deliberate speech and thought process(improved social functioning, healthy adjustment to living situation), Learn about diagnosis, healthy coping skills  Interventions: Motivational Interviewing, CBT, Grounding & Mindfulness Techniques, psychoeducation  Summary: Michele [name] is a 68 y.o. female who presents with Major Depressive Disorder, recurrent episode, moderate  Suicidal/Homicidal: No - without intent/plan  Therapist Response: Enid Derry met with clinician for an individual session.  Tisha discussed her psychiatric symptoms, her current life events and her homework. [name] shared that she has been working on replacing her depression and anxiety with gratitude, especially during the holidays. She reported she saw a lot of extended family over the holidays and felt very thankful to have them in the home. She also reported that she missed her brothers, who have died, and her other family members who were unable to come in. Clinician reflected the value of gratitude in life in order to "cancel out" anxiety.  [name] reports increase in anxiety about finances. She reports her daughter has been supporting her, buying her everything she needs, hiring an aid to help her a few days per week, etc. However, [name] feels guilty and worried about money. Clinician explored how well [name] has communicated this with daughter and encouraged Blair to talk about her feelings and see if there are any ways to either reduce costs or bring in more money. Clinician provided phone number for DSS to see if case management is  available.   Plan: Return again in 2-3 weeks  Diagnosis:     Axis I: Major Depressive Disorder, recurrent episode, moderate   Mindi Curling, LCSW 07/24/2018

## 2018-07-31 ENCOUNTER — Ambulatory Visit: Payer: Medicare Other | Admitting: Pharmacist

## 2018-07-31 ENCOUNTER — Other Ambulatory Visit: Payer: Self-pay | Admitting: Cardiovascular Disease

## 2018-07-31 DIAGNOSIS — I480 Paroxysmal atrial fibrillation: Secondary | ICD-10-CM | POA: Diagnosis not present

## 2018-07-31 DIAGNOSIS — Z7901 Long term (current) use of anticoagulants: Secondary | ICD-10-CM

## 2018-07-31 LAB — POCT INR: INR: 1.5 — AB (ref 2.0–3.0)

## 2018-08-01 ENCOUNTER — Other Ambulatory Visit: Payer: Self-pay

## 2018-08-01 ENCOUNTER — Emergency Department (HOSPITAL_COMMUNITY): Payer: Medicare Other

## 2018-08-01 ENCOUNTER — Encounter (HOSPITAL_COMMUNITY): Payer: Self-pay | Admitting: Emergency Medicine

## 2018-08-01 ENCOUNTER — Inpatient Hospital Stay (HOSPITAL_COMMUNITY)
Admission: EM | Admit: 2018-08-01 | Discharge: 2018-08-10 | DRG: 190 | Disposition: A | Discharge status: Home Health | Payer: Medicare Other | Attending: Family Medicine | Admitting: Family Medicine

## 2018-08-01 DIAGNOSIS — R710 Precipitous drop in hematocrit: Secondary | ICD-10-CM | POA: Diagnosis not present

## 2018-08-01 DIAGNOSIS — E785 Hyperlipidemia, unspecified: Secondary | ICD-10-CM | POA: Diagnosis not present

## 2018-08-01 DIAGNOSIS — Z7951 Long term (current) use of inhaled steroids: Secondary | ICD-10-CM | POA: Diagnosis not present

## 2018-08-01 DIAGNOSIS — A419 Sepsis, unspecified organism: Secondary | ICD-10-CM | POA: Diagnosis not present

## 2018-08-01 DIAGNOSIS — R05 Cough: Secondary | ICD-10-CM | POA: Diagnosis not present

## 2018-08-01 DIAGNOSIS — Z6841 Body Mass Index (BMI) 40.0 and over, adult: Secondary | ICD-10-CM

## 2018-08-01 DIAGNOSIS — F329 Major depressive disorder, single episode, unspecified: Secondary | ICD-10-CM | POA: Diagnosis present

## 2018-08-01 DIAGNOSIS — R0789 Other chest pain: Secondary | ICD-10-CM

## 2018-08-01 DIAGNOSIS — F4310 Post-traumatic stress disorder, unspecified: Secondary | ICD-10-CM | POA: Diagnosis present

## 2018-08-01 DIAGNOSIS — J441 Chronic obstructive pulmonary disease with (acute) exacerbation: Principal | ICD-10-CM

## 2018-08-01 DIAGNOSIS — T380X5A Adverse effect of glucocorticoids and synthetic analogues, initial encounter: Secondary | ICD-10-CM | POA: Diagnosis not present

## 2018-08-01 DIAGNOSIS — R9439 Abnormal result of other cardiovascular function study: Secondary | ICD-10-CM | POA: Diagnosis not present

## 2018-08-01 DIAGNOSIS — E11620 Type 2 diabetes mellitus with diabetic dermatitis: Secondary | ICD-10-CM | POA: Diagnosis not present

## 2018-08-01 DIAGNOSIS — J9621 Acute and chronic respiratory failure with hypoxia: Secondary | ICD-10-CM | POA: Diagnosis not present

## 2018-08-01 DIAGNOSIS — D509 Iron deficiency anemia, unspecified: Secondary | ICD-10-CM

## 2018-08-01 DIAGNOSIS — Z9981 Dependence on supplemental oxygen: Secondary | ICD-10-CM | POA: Diagnosis not present

## 2018-08-01 DIAGNOSIS — I480 Paroxysmal atrial fibrillation: Secondary | ICD-10-CM | POA: Diagnosis present

## 2018-08-01 DIAGNOSIS — Z9119 Patient's noncompliance with other medical treatment and regimen: Secondary | ICD-10-CM

## 2018-08-01 DIAGNOSIS — K219 Gastro-esophageal reflux disease without esophagitis: Secondary | ICD-10-CM | POA: Diagnosis present

## 2018-08-01 DIAGNOSIS — R079 Chest pain, unspecified: Secondary | ICD-10-CM | POA: Diagnosis not present

## 2018-08-01 DIAGNOSIS — I10 Essential (primary) hypertension: Secondary | ICD-10-CM | POA: Diagnosis not present

## 2018-08-01 DIAGNOSIS — J45909 Unspecified asthma, uncomplicated: Secondary | ICD-10-CM | POA: Diagnosis not present

## 2018-08-01 DIAGNOSIS — Z794 Long term (current) use of insulin: Secondary | ICD-10-CM | POA: Diagnosis not present

## 2018-08-01 DIAGNOSIS — G4733 Obstructive sleep apnea (adult) (pediatric): Secondary | ICD-10-CM | POA: Diagnosis not present

## 2018-08-01 DIAGNOSIS — R7401 Elevation of levels of liver transaminase levels: Secondary | ICD-10-CM

## 2018-08-01 DIAGNOSIS — Z955 Presence of coronary angioplasty implant and graft: Secondary | ICD-10-CM

## 2018-08-01 DIAGNOSIS — Z7901 Long term (current) use of anticoagulants: Secondary | ICD-10-CM | POA: Diagnosis not present

## 2018-08-01 DIAGNOSIS — Z888 Allergy status to other drugs, medicaments and biological substances status: Secondary | ICD-10-CM

## 2018-08-01 DIAGNOSIS — E1165 Type 2 diabetes mellitus with hyperglycemia: Secondary | ICD-10-CM | POA: Diagnosis present

## 2018-08-01 DIAGNOSIS — Z7984 Long term (current) use of oral hypoglycemic drugs: Secondary | ICD-10-CM

## 2018-08-01 DIAGNOSIS — J80 Acute respiratory distress syndrome: Secondary | ICD-10-CM | POA: Diagnosis not present

## 2018-08-01 DIAGNOSIS — I25118 Atherosclerotic heart disease of native coronary artery with other forms of angina pectoris: Secondary | ICD-10-CM | POA: Diagnosis not present

## 2018-08-01 DIAGNOSIS — J9600 Acute respiratory failure, unspecified whether with hypoxia or hypercapnia: Secondary | ICD-10-CM | POA: Diagnosis present

## 2018-08-01 DIAGNOSIS — Z79899 Other long term (current) drug therapy: Secondary | ICD-10-CM

## 2018-08-01 DIAGNOSIS — R0602 Shortness of breath: Secondary | ICD-10-CM | POA: Diagnosis not present

## 2018-08-01 DIAGNOSIS — E119 Type 2 diabetes mellitus without complications: Secondary | ICD-10-CM

## 2018-08-01 DIAGNOSIS — R740 Nonspecific elevation of levels of transaminase and lactic acid dehydrogenase [LDH]: Secondary | ICD-10-CM | POA: Diagnosis not present

## 2018-08-01 HISTORY — DX: Personal history of other diseases of the digestive system: Z87.19

## 2018-08-01 HISTORY — DX: Other chronic pain: G89.29

## 2018-08-01 HISTORY — DX: Chronic obstructive pulmonary disease, unspecified: J44.9

## 2018-08-01 HISTORY — DX: Gastro-esophageal reflux disease without esophagitis: K21.9

## 2018-08-01 HISTORY — DX: Cerebral infarction, unspecified: I63.9

## 2018-08-01 HISTORY — DX: Unspecified urinary incontinence: R32

## 2018-08-01 HISTORY — DX: Other intervertebral disc degeneration, lumbar region without mention of lumbar back pain or lower extremity pain: M51.369

## 2018-08-01 HISTORY — DX: Irritable bowel syndrome, unspecified: K58.9

## 2018-08-01 HISTORY — DX: Transient cerebral ischemic attack, unspecified: G45.9

## 2018-08-01 HISTORY — DX: Other intervertebral disc degeneration, lumbar region: M51.36

## 2018-08-01 HISTORY — DX: Dependence on supplemental oxygen: Z99.81

## 2018-08-01 HISTORY — DX: Low back pain: M54.5

## 2018-08-01 HISTORY — DX: Personal history of peptic ulcer disease: Z87.11

## 2018-08-01 HISTORY — DX: Obstructive sleep apnea (adult) (pediatric): G47.33

## 2018-08-01 HISTORY — DX: Dependence on other enabling machines and devices: Z99.89

## 2018-08-01 HISTORY — DX: Low back pain, unspecified: M54.50

## 2018-08-01 LAB — CBC WITH DIFFERENTIAL/PLATELET
ABS IMMATURE GRANULOCYTES: 0.04 10*3/uL (ref 0.00–0.07)
Basophils Absolute: 0.1 10*3/uL (ref 0.0–0.1)
Basophils Relative: 1 %
Eosinophils Absolute: 0.8 10*3/uL — ABNORMAL HIGH (ref 0.0–0.5)
Eosinophils Relative: 8 %
HCT: 39.4 % (ref 36.0–46.0)
Hemoglobin: 11.5 g/dL — ABNORMAL LOW (ref 12.0–15.0)
Immature Granulocytes: 0 %
Lymphocytes Relative: 33 %
Lymphs Abs: 3.5 10*3/uL (ref 0.7–4.0)
MCH: 21.3 pg — ABNORMAL LOW (ref 26.0–34.0)
MCHC: 29.2 g/dL — ABNORMAL LOW (ref 30.0–36.0)
MCV: 73 fL — ABNORMAL LOW (ref 80.0–100.0)
Monocytes Absolute: 0.8 10*3/uL (ref 0.1–1.0)
Monocytes Relative: 7 %
NEUTROS ABS: 5.3 10*3/uL (ref 1.7–7.7)
Neutrophils Relative %: 51 %
Platelets: 259 10*3/uL (ref 150–400)
RBC: 5.4 MIL/uL — ABNORMAL HIGH (ref 3.87–5.11)
RDW: 20 % — ABNORMAL HIGH (ref 11.5–15.5)
WBC: 10.6 10*3/uL — ABNORMAL HIGH (ref 4.0–10.5)
nRBC: 0 % (ref 0.0–0.2)

## 2018-08-01 LAB — COMPREHENSIVE METABOLIC PANEL
ALT: 33 U/L (ref 0–44)
AST: 33 U/L (ref 15–41)
Albumin: 3.7 g/dL (ref 3.5–5.0)
Alkaline Phosphatase: 76 U/L (ref 38–126)
Anion gap: 11 (ref 5–15)
BUN: 10 mg/dL (ref 8–23)
CO2: 25 mmol/L (ref 22–32)
Calcium: 9.1 mg/dL (ref 8.9–10.3)
Chloride: 104 mmol/L (ref 98–111)
Creatinine, Ser: 0.74 mg/dL (ref 0.44–1.00)
GFR calc Af Amer: >60 mL/min (ref >60)
GFR calc non Af Amer: >60 mL/min (ref >60)
Glucose, Bld: 129 mg/dL — ABNORMAL HIGH (ref 70–99)
Potassium: 4 mmol/L (ref 3.5–5.1)
Sodium: 140 mmol/L (ref 135–145)
Total Bilirubin: 0.5 mg/dL (ref 0.3–1.2)
Total Protein: 7.8 g/dL (ref 6.5–8.1)

## 2018-08-01 LAB — I-STAT VENOUS BLOOD GAS, ED
Acid-Base Excess: 5 mmol/L — ABNORMAL HIGH (ref 0.0–2.0)
Bicarbonate: 31.7 mmol/L — ABNORMAL HIGH (ref 20.0–28.0)
O2 Saturation: 70 %
PO2 VEN: 39 mmHg (ref 32.0–45.0)
TCO2: 33 mmol/L — ABNORMAL HIGH (ref 22–32)
pCO2, Ven: 56.4 mmHg (ref 44.0–60.0)
pH, Ven: 7.358 (ref 7.250–7.430)

## 2018-08-01 LAB — I-STAT CG4 LACTIC ACID, ED: Lactic Acid, Venous: 2.25 mmol/L (ref 0.5–1.9)

## 2018-08-01 LAB — MRSA PCR SCREENING: MRSA by PCR: NEGATIVE

## 2018-08-01 LAB — I-STAT TROPONIN, ED: Troponin i, poc: 0.01 ng/mL (ref 0.00–0.08)

## 2018-08-01 LAB — GLUCOSE, CAPILLARY
Glucose-Capillary: 137 mg/dL — ABNORMAL HIGH (ref 70–99)
Glucose-Capillary: 137 mg/dL — ABNORMAL HIGH (ref 70–99)

## 2018-08-01 MED ORDER — SERTRALINE HCL 50 MG PO TABS
200.0000 mg | ORAL_TABLET | Freq: Every day | ORAL | Status: DC
  Administered 2018-08-01 – 2018-08-10 (×10): 200 mg via ORAL
  Filled 2018-08-01 (×3): qty 2
  Filled 2018-08-01: qty 4
  Filled 2018-08-01 (×6): qty 2

## 2018-08-01 MED ORDER — INSULIN ASPART 100 UNIT/ML ~~LOC~~ SOLN
0.0000 [IU] | Freq: Every day | SUBCUTANEOUS | Status: DC
  Administered 2018-08-08: 1 [IU] via SUBCUTANEOUS

## 2018-08-01 MED ORDER — METHYLPREDNISOLONE SODIUM SUCC 40 MG IJ SOLR
40.0000 mg | Freq: Four times a day (QID) | INTRAMUSCULAR | Status: AC
  Administered 2018-08-01 – 2018-08-02 (×4): 40 mg via INTRAVENOUS
  Filled 2018-08-01 (×4): qty 1

## 2018-08-01 MED ORDER — ALBUTEROL SULFATE (2.5 MG/3ML) 0.083% IN NEBU
5.0000 mg | INHALATION_SOLUTION | RESPIRATORY_TRACT | Status: AC
  Administered 2018-08-01 (×2): 5 mg via RESPIRATORY_TRACT
  Filled 2018-08-01: qty 6

## 2018-08-01 MED ORDER — ARIPIPRAZOLE 5 MG PO TABS
5.0000 mg | ORAL_TABLET | Freq: Every day | ORAL | Status: DC
  Administered 2018-08-01 – 2018-08-10 (×10): 5 mg via ORAL
  Filled 2018-08-01 (×10): qty 1

## 2018-08-01 MED ORDER — ACETAMINOPHEN 650 MG RE SUPP: 650.0000 mg | Freq: Four times a day (QID) | RECTAL | Status: DC | PRN

## 2018-08-01 MED ORDER — WARFARIN SODIUM 7.5 MG PO TABS
15.0000 mg | ORAL_TABLET | Freq: Once | ORAL | Status: AC
  Administered 2018-08-01: 15 mg via ORAL
  Filled 2018-08-01 (×2): qty 2

## 2018-08-01 MED ORDER — GUAIFENESIN ER 600 MG PO TB12
600.0000 mg | ORAL_TABLET | Freq: Two times a day (BID) | ORAL | Status: DC
  Administered 2018-08-01 – 2018-08-10 (×18): 600 mg via ORAL
  Filled 2018-08-01 (×19): qty 1

## 2018-08-01 MED ORDER — ALBUTEROL SULFATE (2.5 MG/3ML) 0.083% IN NEBU
2.5000 mg | INHALATION_SOLUTION | RESPIRATORY_TRACT | Status: DC | PRN
  Administered 2018-08-01: 2.5 mg via RESPIRATORY_TRACT
  Filled 2018-08-01: qty 3

## 2018-08-01 MED ORDER — ALBUTEROL (5 MG/ML) CONTINUOUS INHALATION SOLN: 10.0000 mg/h | INHALATION_SOLUTION | Freq: Once | RESPIRATORY_TRACT | Status: DC

## 2018-08-01 MED ORDER — WARFARIN - PHARMACIST DOSING INPATIENT
Freq: Every day | Status: DC
  Administered 2018-08-01 – 2018-08-03 (×3)

## 2018-08-01 MED ORDER — PANTOPRAZOLE SODIUM 40 MG PO TBEC
40.0000 mg | DELAYED_RELEASE_TABLET | Freq: Every day | ORAL | Status: DC
  Administered 2018-08-01 – 2018-08-10 (×10): 40 mg via ORAL
  Filled 2018-08-01 (×10): qty 1

## 2018-08-01 MED ORDER — PREDNISONE 20 MG PO TABS
40.0000 mg | ORAL_TABLET | Freq: Every day | ORAL | Status: DC
  Administered 2018-08-03 – 2018-08-04 (×2): 40 mg via ORAL
  Filled 2018-08-01 (×2): qty 2

## 2018-08-01 MED ORDER — ALBUTEROL SULFATE (2.5 MG/3ML) 0.083% IN NEBU
INHALATION_SOLUTION | RESPIRATORY_TRACT | Status: AC
  Administered 2018-08-01: 5 mg via RESPIRATORY_TRACT
  Filled 2018-08-01: qty 6

## 2018-08-01 MED ORDER — GABAPENTIN 300 MG PO CAPS
300.0000 mg | ORAL_CAPSULE | Freq: Three times a day (TID) | ORAL | Status: DC | PRN
  Administered 2018-08-03 (×2): 300 mg via ORAL
  Filled 2018-08-01 (×2): qty 1

## 2018-08-01 MED ORDER — ACETAMINOPHEN 325 MG PO TABS
650.0000 mg | ORAL_TABLET | Freq: Four times a day (QID) | ORAL | Status: DC | PRN
  Administered 2018-08-01 – 2018-08-08 (×4): 650 mg via ORAL
  Filled 2018-08-01 (×4): qty 2

## 2018-08-01 MED ORDER — SODIUM CHLORIDE 0.9 % IV SOLN
INTRAVENOUS | Status: DC
  Administered 2018-08-01: 09:00:00 via INTRAVENOUS

## 2018-08-01 MED ORDER — MOMETASONE FURO-FORMOTEROL FUM 200-5 MCG/ACT IN AERO
2.0000 | INHALATION_SPRAY | Freq: Two times a day (BID) | RESPIRATORY_TRACT | Status: DC
  Administered 2018-08-01 – 2018-08-10 (×17): 2 via RESPIRATORY_TRACT
  Filled 2018-08-01: qty 8.8

## 2018-08-01 MED ORDER — SODIUM CHLORIDE 0.9 % IV SOLN
1.0000 g | INTRAVENOUS | Status: DC
  Administered 2018-08-01 – 2018-08-02 (×2): 1 g via INTRAVENOUS
  Filled 2018-08-01 (×2): qty 10

## 2018-08-01 MED ORDER — IPRATROPIUM-ALBUTEROL 0.5-2.5 (3) MG/3ML IN SOLN
3.0000 mL | Freq: Four times a day (QID) | RESPIRATORY_TRACT | Status: DC
  Administered 2018-08-01 – 2018-08-05 (×16): 3 mL via RESPIRATORY_TRACT
  Filled 2018-08-01 (×16): qty 3

## 2018-08-01 MED ORDER — INSULIN ASPART 100 UNIT/ML ~~LOC~~ SOLN
0.0000 [IU] | Freq: Three times a day (TID) | SUBCUTANEOUS | Status: DC
  Administered 2018-08-01 – 2018-08-02 (×2): 1 [IU] via SUBCUTANEOUS
  Administered 2018-08-04 – 2018-08-07 (×2): 2 [IU] via SUBCUTANEOUS

## 2018-08-01 MED ORDER — METOPROLOL TARTRATE 25 MG PO TABS
25.0000 mg | ORAL_TABLET | Freq: Two times a day (BID) | ORAL | Status: DC
  Administered 2018-08-01 – 2018-08-04 (×7): 25 mg via ORAL
  Filled 2018-08-01 (×7): qty 1

## 2018-08-01 NOTE — ED Provider Notes (Signed)
[facility] EMERGENCY DEPARTMENT Provider Note   CSN: [id number] Arrival date & time: 08/01/18  0756     History   Chief Complaint Chief Complaint  Patient presents with  . Shortness of Breath    HPI Michele [name] is a 68 y.o. female.  Patient is a 68 year old female with a history of coronary artery disease, paroxysmal atrial fibrillation on Coumadin, COPD on chronic oxygen, diabetes and hypertension who is presenting today with respiratory distress.  Patient states over the last 4 weeks she has had cough congestion and wheezing.  She has been using her nebulizers and rescue inhaler at home and has followed up with her pulmonologist Dr. Annamaria Boots and had been treated with Augmentin for pneumonia.  She continues to have low-grade fevers and chills, productive cough of brown to yellow sputum and intermittent shortness of breath.  On Friday she had a bad episode that improved with nebulizers but this morning when she woke up she was unable to catch her breath.  She tried to nebulizers at home but still could not breathe and called 911.  EMS reports when they arrived patient was tripoding and satting 83% on room air.  She was given 10 of albuterol, 1 of Atrovent, 125 of Solu-Medrol and 2 g of magnesium with improvement.  Patient states she feels better than she did but she still feels short of breath.  She denies any swelling or pain in her legs, vomiting or diarrhea.  She is sore throughout her chest and upper abdomen.  The history is provided by the patient and the EMS personnel.  Shortness of Breath  Timing:  Intermittent Progression:  Worsening Chronicity:  Recurrent Risk factors comment:  Hx of COPD that has required intubation in the past   Past Medical History:  Diagnosis Date  . Allergic rhinitis   . ARF (acute renal failure) ([place]) 03/2017  . 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 Asthma   . CAD (coronary artery disease)   . CAD (coronary  artery disease) 01/30/2007   stents trivial coronary artery disease diffusely, with a recent deployment od a intracoronary artery stent, 3.5 x 12 mm driver stent with no more than 20-30% in- stents restenosis.done by Dr [name] with re-look on novenber 10 2008 revealing a widely patent stent with otherwise trival CAD and normal LV function  . Carpal tunnel syndrome, right   . Chest pain 07/17/12009   2 D Echo EF >55%  . Complication of anesthesia    slow to wake up  . Depression   . Diabetes mellitus type II   . Gait instability   . History of kidney stones   . Hyperlipidemia   . Hypertension   . Iron deficiency anemia 07/13/2016  . Obesity   . Paroxysmal A-fib ([place])   . PTSD (post-traumatic stress disorder)   . Sleep apnea    cpap    Patient Active Problem List   Diagnosis Date Noted  . Acute blood loss anemia 09/25/2017  . GIB (gastrointestinal bleeding) 09/24/2017  . Acute metabolic encephalopathy 31/49/7026  . Nausea vomiting and diarrhea 09/24/2017  . Sepsis ([place]) 09/24/2017  . Degeneration of lumbar intervertebral disc 09/10/2017  . Lumbar radiculopathy 08/18/2017  . Chronic pain syndrome 08/04/2017  . Chronic low back pain 08/04/2017  . AKI (acute kidney injury) ([place]) 04/11/2017  . AF (paroxysmal atrial fibrillation) ([place]) 04/11/2017  . Anticoagulated 04/11/2017  . Hyperkalemia 04/11/2017  . Rhabdomyolysis 04/11/2017  .  ARF (acute renal failure) ([place]) 04/11/2017  . Iron deficiency anemia 07/13/2016  . Stroke ([place]) 11/19/2015  . Dyspnea on exertion 07/31/2015  . Abnormality of gait 05/22/2015  . Spinal stenosis in cervical region 05/22/2015  . Hypotension 2020-02-915  . Hyperlipidemia 07/17/2013  . Chest pain on exertion 12/14/2012  . Long term current use of anticoagulant therapy 10/03/2012  . Major depressive disorder, recurrent episode, moderate ([place]) 09/02/2011  . Menopausal and perimenopausal disorder 05/27/2011  . Depression 05/27/2011  .  Urethrocele(618.03) 05/27/2011  . Anemia, iron deficiency 05/27/2011  . Paroxysmal A-fib ([place]) 05/27/2011  . Vitamin D deficiency 05/27/2011  . PTSD (post-traumatic stress disorder) 05/27/2011  . Abnormal brain MRI 05/27/2011  . PAD (peripheral artery disease) ([place]) 05/27/2011  . PFO (patent foramen ovale) 05/27/2011  . Diabetes mellitus, type II ([place])   . DM 12/16/2009  . 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 Obstructive sleep apnea 05/10/2007  . Essential hypertension 05/10/2007  . Coronary atherosclerosis 05/10/2007  . Seasonal and perennial allergic rhinitis 05/10/2007  . Asthma, mild intermittent 05/10/2007  . REFLUX, ESOPHAGEAL 05/10/2007    Past Surgical History:  Procedure Laterality Date  . cardiac stents    . CHOLECYSTECTOMY    . COLONOSCOPY WITH PROPOFOL N/A 11/18/2017   Procedure: COLONOSCOPY WITH PROPOFOL;  Surgeon: Carol Ada, MD;  Location: [facility];  Service: Endoscopy;  Laterality: N/A;  . ESOPHAGOGASTRODUODENOSCOPY N/A 09/25/2017   Procedure: ESOPHAGOGASTRODUODENOSCOPY (EGD);  Surgeon: Clarene Essex, MD;  Location: [facility];  Service: Endoscopy;  Laterality: N/A;  . GALLBLADDER SURGERY    . HEMORRHOID SURGERY    . Kidney stones    . Left groin cyst    . multilevel lumbar degenerative disc disease    . stress myocardial perfusion study  09/08/2010   normal; EF 75%     OB History   No obstetric history on file.      Home Medications    Prior to Admission medications   Medication Sig Start Date End Date Taking? Authorizing Provider  ADVAIR DISKUS 250-50 MCG/DOSE AEPB INHALE ONE PUFF TWICE DAILY 06/03/16   Baird Lyons D, MD  albuterol (VENTOLIN HFA) 108 (90 Base) MCG/ACT inhaler Inhale 1 puff into the lungs every 6 (six) hours as needed for wheezing or shortness of breath. Patient taking differently: Inhale 2 puffs into the lungs every 6 (six) hours as needed for wheezing or shortness of breath.  10/18/16   Curt Bears, MD    amoxicillin-clavulanate (AUGMENTIN) 875-125 MG tablet Take 1 tablet by mouth 2 (two) times daily. 06/26/18   Baird Lyons D, MD  ARIPiprazole (ABILIFY) 5 MG tablet Take 1 tablet (5 mg total) by mouth daily. 05/16/18   Arfeen, Arlyce Harman, MD  FeFum-FePoly-FA-B Cmp-C-Biot (INTEGRA PLUS) CAPS Take 1 capsule by mouth every morning. 06/28/18   Curt Bears, MD  gabapentin (NEURONTIN) 300 MG capsule Take 300 mg by mouth 3 (three) times daily as needed (for pain).  07/04/15   [provider]  ipratropium-albuterol (DUONEB) 0.5-2.5 (3) MG/3ML SOLN USE 3 ML IN NEBULIZER EVERY 6 HOURS AS NEEDED DX: J45.20 06/29/18   Parrett, Tammy S, NP  MAGNESIUM GLUCONATE PO Take by mouth.    [provider]  metFORMIN (GLUCOPHAGE-XR) 500 MG 24 hr tablet TAKE ONE (1) TABLET BY MOUTH EVERY DAY WITH BREAKFAST 11/28/15   [name], MD  metoprolol tartrate (LOPRESSOR) 25 MG tablet Take 1 tablet (25 mg total) by mouth 2 (two) times daily. 03/07/18   Lorretta Harp,  MD  nitroGLYCERIN (NITROSTAT) 0.4 MG SL tablet PLACE 1 TABLET UNDER THE TONGUE EVERY 5 MINUTES AS NEEDED FOR CHEST PAIN.  MAY REPEAT 3 TIMES.  IF NO RELIEF CALL DOCTOR 07/01/17   Lorretta Harp, MD  OXYGEN Inhale 2 L into the lungs as needed (for shortness of breath).     [provider]  pantoprazole (PROTONIX) 40 MG tablet Take 1 tablet (40 mg total) by mouth 2 (two) times daily before a meal. Take twice a day before meals for 8 weeks, then once a day Patient taking differently: Take 40 mg by mouth See admin instructions. Take 40 mg by mouth twice daily, starting 05/01 take 40 mg by mouth once daily 09/29/17   Rai, Ripudeep K, MD  rosuvastatin (CRESTOR) 10 MG tablet TAKE ONE (1) TABLET BY MOUTH EVERY DAY Patient taking differently: TAKE ONE (1) TABLET (10 MG)  BY MOUTH EVERY DAY 07/13/16   Jaynee Eagles, PA-C  sertraline (ZOLOFT) 100 MG tablet Take 2 tablets (200 mg total) by mouth daily. 05/16/18   Arfeen, Arlyce Harman, MD  trospium  (SANCTURA) 20 MG tablet trospium    [provider]  warfarin (COUMADIN) 7.5 MG tablet TAKE ONE AND ONE-HALF TO TWO TABLETS BY MOUTH DAILY AS DIRECTED 07/31/18   Lorretta Harp, MD  SANCTURA XR 60 MG CP24 Take 1 capsule by mouth daily. 10/20/10 03/12/13  [provider]    Family History Family History  Problem Relation Age of Onset  . Coronary artery disease Mother   . Coronary artery disease Father   . Depression Father     Social History Social History   Tobacco Use  . Smoking status: Never Smoker  . Smokeless tobacco: Never Used  Substance Use Topics  . Alcohol use: No    Alcohol/week: 0.0 standard drinks  . Drug use: No     Allergies   Meprobamate   Review of Systems Review of Systems  Respiratory: Positive for shortness of breath.   All other systems reviewed and are negative.    Physical Exam Updated Vital Signs BP 127/83 (BP Location: Right Arm)   Pulse (!) 104   Temp 97.7 F (36.5 C) (Axillary)   Resp (!) 27   SpO2 100%   Physical Exam Vitals signs and nursing note reviewed.  Constitutional:      General: She is in acute distress.     Appearance: She is well-developed. She is ill-appearing and diaphoretic.  HENT:     Head: Normocephalic and atraumatic.     Nose: Nose normal.     Mouth/Throat:     Mouth: Mucous membranes are dry.  Eyes:     Conjunctiva/sclera: Conjunctivae normal.     Pupils: Pupils are equal, round, and reactive to light.  Neck:     Musculoskeletal: Normal range of motion and neck supple.  Cardiovascular:     Rate and Rhythm: Regular rhythm. Tachycardia present.     Heart sounds: No murmur.  Pulmonary:     Effort: Tachypnea, accessory muscle usage, respiratory distress and retractions present.     Breath sounds: Wheezing present. No rales.  Abdominal:     General: There is no distension.     Palpations: Abdomen is soft.     Tenderness: There is no abdominal tenderness. There is no guarding or rebound.    Musculoskeletal: Normal range of motion.        General: No tenderness.     Right lower leg: No edema.  Left lower leg: No edema.  Skin:    General: Skin is warm.     Capillary Refill: Capillary refill takes 2 to 3 seconds.     Findings: No erythema or rash.  Neurological:     Mental Status: She is alert and oriented to person, place, and time. Mental status is at baseline.  Psychiatric:     Comments: Mildly distressed but acting appropriately and able to answer questions in short phrases      ED Treatments / Results  Labs (all labs ordered are listed, but only abnormal results are displayed) Labs Reviewed  CBC WITH DIFFERENTIAL/PLATELET - Abnormal; Notable for the following components:      Result Value   WBC 10.6 (*)    RBC 5.40 (*)    Hemoglobin 11.5 (*)    MCV 73.0 (*)    MCH 21.3 (*)    MCHC 29.2 (*)    RDW 20.0 (*)    Eosinophils Absolute 0.8 (*)    All other components within normal limits  COMPREHENSIVE METABOLIC PANEL - Abnormal; Notable for the following components:   Glucose, Bld 129 (*)    All other components within normal limits  I-STAT CG4 LACTIC ACID, ED - Abnormal; Notable for the following components:   Lactic Acid, Venous 2.25 (*)    All other components within normal limits  I-STAT VENOUS BLOOD GAS, ED - Abnormal; Notable for the following components:   Bicarbonate 31.7 (*)    TCO2 33 (*)    Acid-Base Excess 5.0 (*)    All other components within normal limits  I-STAT TROPONIN, ED    EKG EKG Interpretation  Date/Time:  Tuesday August 01 2018 08:02:08 EST Ventricular Rate:  112 PR Interval:    QRS Duration: 105 QT Interval:  319 QTC Calculation: 436 R Axis:   43 Text Interpretation:  Sinus tachycardia Inferior infarct, age indeterminate Lateral leads are also involved T wave inversion Inferolateral leads Confirmed by Blanchie Dessert [phone]) on 08/01/2018 8:07:50 AM   Radiology Dg Chest Port 1 View  Result Date: 08/01/2018 CLINICAL  DATA:  Shortness of breath and cough EXAM: PORTABLE CHEST 1 VIEW COMPARISON:  09/24/2017 FINDINGS: Normal heart size and mediastinal contours. No acute infiltrate or edema. No effusion or pneumothorax. No acute osseous findings. IMPRESSION: Negative chest. Electronically Signed   By: Monte Fantasia M.D.   On: 08/01/2018 08:27    Procedures Procedures (including critical care time)  Medications Ordered in ED Medications  albuterol (PROVENTIL,VENTOLIN) solution continuous neb (has no administration in time range)     Initial Impression / Assessment and Plan / ED Course  I have reviewed the triage vital signs and the nursing notes.  Pertinent labs & imaging results that were available during my care of the patient were reviewed by me and considered in my medical decision making (see chart for details).     Patient presents today in respiratory distress with diffuse wheezing, tachypnea and retractions.  Patient with a history of COPD on chronic oxygen at home and CPAP.  Recently treated for pneumonia but states has had ongoing symptoms over the last 4 weeks.  Patient does not have evidence of fluid overload at this time and doubt CHF.  Lower suspicion for ACS at this time.  EKG does show sinus tachycardia and some T wave inversions which are changed since her last EKG but similar to ones prior to that.  Patient was treated with magnesium, Solu-Medrol albuterol and Atrovent by EMS.  Upon  arrival here will start on BiPAP for work of breathing.  We will continue an hour-long nebulized treatment.  Chest x-ray, CBC, CMP, troponin, lactate, VBG pending.  Concern for pneumonia versus COPD exacerbation.  9:33 AM Patient's lactic acid was mildly elevated at 2.25 but may be related to all the albuterol, troponin within normal limits, VBG is reassuring, CBC with white count of 10 and hemoglobin of 11 which is stable, CMP without evidence of acute kidney injury.  Chest x-ray within normal limits.  After  patient was placed on BiPAP and got several more treatments she is still wheezing diffusely but work of breathing has significantly improved and patient is resting.  Will admit for COPD exacerbation.  CRITICAL CARE Performed by: Britain Saber Total critical care time: 30 minutes Critical care time was exclusive of separately billable procedures and treating other patients. Critical care was necessary to treat or prevent imminent or life-threatening deterioration. Critical care was time spent personally by me on the following activities: development of treatment plan with patient and/or surrogate as well as nursing, discussions with consultants, evaluation of patient's response to treatment, examination of patient, obtaining history from patient or surrogate, ordering and performing treatments and interventions, ordering and review of laboratory studies, ordering and review of radiographic studies, pulse oximetry and re-evaluation of patient's condition.   Final Clinical Impressions(s) / ED Diagnoses   Final diagnoses:  COPD exacerbation ([place])  Acute respiratory failure, unspecified whether with hypoxia or hypercapnia [facility])    ED Discharge Orders    None       Blanchie Dessert, MD 08/01/18 [phone]

## 2018-08-01 NOTE — ED Notes (Addendum)
RN attempted line twice. Called phlebotomy. Portable at bedside. Bipap being hooked up now

## 2018-08-01 NOTE — ED Notes (Signed)
Report given to [facility], nurse on floor

## 2018-08-01 NOTE — ED Notes (Signed)
RT called to initate bipap

## 2018-08-01 NOTE — H&P (Signed)
History and Physical    Michele [name] ZOX:[id number] DOB: [birth year] DOA: 08/01/2018  PCP: [name], MD  Patient coming from: Home.  I have personally briefly reviewed patient's old medical records in [facility] and Care everywhere.   Chief Complaint: Wheezing and shortness of breath.  HPI: Michele [name] is a 68 y.o. female with medical history significant of sleep apnea noncompliant to CPAP, paroxysmal A. 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  Patient is currently on BiPAP.  I reviewed her previous records.  Patient's part-time caregiver and daughter at the bedside.  According to the patient and caregiver, she has been having some symptoms about a month now, was wheezing and coughing most of the time, she went to see her pulmonologist and was prescribed a course of Augmentin, she will have intermittent improvement, however never got to her good health.  Patient continued to have low-grade fever and chills.  Sputum reported with occasional mucoid and yellow.  She was having frequent episodes at home that will improve with nebulizers.  Today morning, she had another attack and tried nebulizer at home but she was unable to catch her breath so she called 911.  EMS reported that patient was tripoding and was 83% on room air.  She was not wearing oxygen on her nose.  Patient was given multiple rounds of albuterol, Atrovent, 125 mg Solu-Medrol and magnesium with some improvement.  She was brought to the ER.  In the ER, she was given on the rounds of albuterol and started on BiPAP for respiratory distress and currently feeling some how better.  ED Course: Patient is  hemodynamically stable.  Her venous lactate is slightly elevated which is probably due to work of breathing. Blood pressures are stable.  WBC count is high normal. Family suspect noncompliance to scheduled inhalers. INR is subtherapeutic, probably missing doses. Review of Systems: As per HPI otherwise 10 point review of systems negative.    Past Medical History:  Diagnosis Date  . Allergic rhinitis   . ARF (acute renal failure) ([place]) 03/2017  . 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 Asthma   . CAD (coronary artery disease)   . CAD (coronary artery disease) 01/30/2007   stents trivial coronary artery disease diffusely, with a recent deployment od a intracoronary artery stent, 3.5 x 12 mm driver stent with no more than 20-30% in- stents restenosis.done by Dr [name] with re-look on novenber 10 2008 revealing a widely patent stent with otherwise trival CAD and normal LV function  . Carpal tunnel syndrome, right   . Chest pain 07/17/12009   2 D Echo EF >55%  . Complication of anesthesia    slow to wake up  . Depression   . Diabetes mellitus type II   . Gait instability   . History of kidney stones   . Hyperlipidemia   . Hypertension   . Iron deficiency anemia 07/13/2016  . Obesity   . Paroxysmal A-fib ([place])   . PTSD (post-traumatic stress disorder)   . Sleep apnea    cpap    Past Surgical History:  Procedure Laterality Date  . cardiac stents    . CHOLECYSTECTOMY    .  COLONOSCOPY WITH PROPOFOL N/A 11/18/2017   Procedure: COLONOSCOPY WITH PROPOFOL;  Surgeon: Carol Ada, MD;  Location: [facility];  Service: Endoscopy;  Laterality: N/A;  . ESOPHAGOGASTRODUODENOSCOPY N/A 09/25/2017   Procedure: ESOPHAGOGASTRODUODENOSCOPY (EGD);  Surgeon: Clarene Essex, MD;  Location: [facility];  Service: Endoscopy;  Laterality: N/A;  . GALLBLADDER SURGERY    . HEMORRHOID SURGERY    . Kidney stones    . Left groin cyst    . multilevel lumbar degenerative disc disease    . stress  myocardial perfusion study  09/08/2010   normal; EF 75%     reports that she has never smoked. She has never used smokeless tobacco. She reports that she does not drink alcohol or use drugs.  Allergies  Allergen Reactions  . Meprobamate Nausea And Vomiting    Family History  Problem Relation Age of Onset  . Coronary artery disease Mother   . Coronary artery disease Father   . Depression Father      Prior to Admission medications   Medication Sig Start Date End Date Taking? Authorizing Provider  ADVAIR DISKUS 250-50 MCG/DOSE AEPB INHALE ONE PUFF TWICE DAILY 06/03/16   Baird Lyons D, MD  albuterol (VENTOLIN HFA) 108 (90 Base) MCG/ACT inhaler Inhale 1 puff into the lungs every 6 (six) hours as needed for wheezing or shortness of breath. Patient taking differently: Inhale 2 puffs into the lungs every 6 (six) hours as needed for wheezing or shortness of breath.  10/18/16   Curt Bears, MD  amoxicillin-clavulanate (AUGMENTIN) 875-125 MG tablet Take 1 tablet by mouth 2 (two) times daily. 06/26/18   Baird Lyons D, MD  ARIPiprazole (ABILIFY) 5 MG tablet Take 1 tablet (5 mg total) by mouth daily. 05/16/18   Arfeen, Arlyce Harman, MD  FeFum-FePoly-FA-B Cmp-C-Biot (INTEGRA PLUS) CAPS Take 1 capsule by mouth every morning. 06/28/18   Curt Bears, MD  gabapentin (NEURONTIN) 300 MG capsule Take 300 mg by mouth 3 (three) times daily as needed (for pain).  07/04/15   [provider]  ipratropium-albuterol (DUONEB) 0.5-2.5 (3) MG/3ML SOLN USE 3 ML IN NEBULIZER EVERY 6 HOURS AS NEEDED DX: J45.20 06/29/18   Parrett, Tammy S, NP  MAGNESIUM GLUCONATE PO Take by mouth.    [provider]  metFORMIN (GLUCOPHAGE-XR) 500 MG 24 hr tablet TAKE ONE (1) TABLET BY MOUTH EVERY DAY WITH BREAKFAST 11/28/15   [name], MD  metoprolol tartrate (LOPRESSOR) 25 MG tablet Take 1 tablet (25 mg total) by mouth 2 (two) times daily. 03/07/18   Lorretta Harp, MD  nitroGLYCERIN (NITROSTAT) 0.4 MG SL  tablet PLACE 1 TABLET UNDER THE TONGUE EVERY 5 MINUTES AS NEEDED FOR CHEST PAIN.  MAY REPEAT 3 TIMES.  IF NO RELIEF CALL DOCTOR 07/01/17   Lorretta Harp, MD  OXYGEN Inhale 2 L into the lungs as needed (for shortness of breath).     [provider]  pantoprazole (PROTONIX) 40 MG tablet Take 1 tablet (40 mg total) by mouth 2 (two) times daily before a meal. Take twice a day before meals for 8 weeks, then once a day Patient taking differently: Take 40 mg by mouth See admin instructions. Take 40 mg by mouth twice daily, starting 05/01 take 40 mg by mouth once daily 09/29/17   Rai, Ripudeep K, MD  rosuvastatin (CRESTOR) 10 MG tablet TAKE ONE (1) TABLET BY MOUTH EVERY DAY Patient taking differently: TAKE ONE (1) TABLET (10 MG)  BY MOUTH EVERY DAY 07/13/16   Jaynee Eagles,  PA-C  sertraline (ZOLOFT) 100 MG tablet Take 2 tablets (200 mg total) by mouth daily. 05/16/18   Arfeen, Arlyce Harman, MD  trospium (SANCTURA) 20 MG tablet trospium    [provider]  warfarin (COUMADIN) 7.5 MG tablet TAKE ONE AND ONE-HALF TO TWO TABLETS BY MOUTH DAILY AS DIRECTED 07/31/18   Lorretta Harp, MD  SANCTURA XR 60 MG CP24 Take 1 capsule by mouth daily. 10/20/10 03/12/13  [provider]    Physical Exam: Vitals:   08/01/18 0935 08/01/18 0945 08/01/18 1000 08/01/18 1015  BP:  115/81 126/83 136/89  Pulse:  97 98 94  Resp:  15 16 20   Temp:      TempSrc:      SpO2: 98% 98% 98% 95%    Constitutional: NAD, calm, comfortable Vitals:   08/01/18 0935 08/01/18 0945 08/01/18 1000 08/01/18 1015  BP:  115/81 126/83 136/89  Pulse:  97 98 94  Resp:  15 16 20   Temp:      TempSrc:      SpO2: 98% 98% 98% 95%   Eyes: PERRL, lids and conjunctivae normal ENMT: Mucous membranes are moist. Posterior pharynx clear of any exudate or lesions.Normal dentition.  Neck: normal, supple, no masses, no thyromegaly Respiratory: Patient is not mild respiratory distress, currently on 40% FiO2, she was able to have some  communication but hindered by BiPAP mask.  She has extensive bilateral expiratory wheezes. Cardiovascular: Regular rate and rhythm, no murmurs / rubs / gallops. No extremity edema. 2+ pedal pulses. No carotid bruits.  Abdomen: no tenderness, no masses palpated. No hepatosplenomegaly. Bowel sounds positive.  Musculoskeletal: no clubbing / cyanosis. No joint deformity upper and lower extremities. Good ROM, no contractures. Normal muscle tone.  Skin: no rashes, lesions, ulcers. No induration Neurologic: CN 2-12 grossly intact. Sensation intact, DTR normal. Strength 5/5 in all 4.  Psychiatric: Normal judgment and insight. Alert and oriented x 3. Normal mood.     Labs on Admission: I have personally reviewed following labs and imaging studies  CBC: Recent Labs  Lab 08/01/18 0831  WBC 10.6*  NEUTROABS 5.3  HGB 11.5*  HCT 39.4  MCV 73.0*  PLT 665   Basic Metabolic Panel: Recent Labs  Lab 08/01/18 0831  NA 140  K 4.0  CL 104  CO2 25  GLUCOSE 129*  BUN 10  CREATININE 0.74  CALCIUM 9.1   GFR: CrCl cannot be calculated (Unknown ideal weight.). Liver Function Tests: Recent Labs  Lab 08/01/18 0831  AST 33  ALT 33  ALKPHOS 76  BILITOT 0.5  PROT 7.8  ALBUMIN 3.7   No results for input(s): LIPASE, AMYLASE in the last 168 hours. No results for input(s): AMMONIA in the last 168 hours. Coagulation Profile: Recent Labs  Lab 07/31/18 1127  INR 1.5*   Cardiac Enzymes: No results for input(s): CKTOTAL, CKMB, CKMBINDEX, TROPONINI in the last 168 hours. BNP (last 3 results) No results for input(s): PROBNP in the last 8760 hours. HbA1C: No results for input(s): HGBA1C in the last 72 hours. CBG: No results for input(s): GLUCAP in the last 168 hours. Lipid Profile: No results for input(s): CHOL, HDL, LDLCALC, TRIG, CHOLHDL, LDLDIRECT in the last 72 hours. Thyroid Function Tests: No results for input(s): TSH, T4TOTAL, FREET4, T3FREE, THYROIDAB in the last 72 hours. Anemia  Panel: No results for input(s): VITAMINB12, FOLATE, FERRITIN, TIBC, IRON, RETICCTPCT in the last 72 hours. Urine analysis:    Component Value Date/Time   COLORURINE STRAW (A) 09/25/2017  [place] 09/25/2017 0640   LABSPEC 1.009 09/25/2017 0640   LABSPEC 1.025 06/23/2007 0832   PHURINE 5.0 09/25/2017 0640   GLUCOSEU NEGATIVE 09/25/2017 0640   HGBUR MODERATE (A) 09/25/2017 0640   BILIRUBINUR small 05/25/2018 1249   BILIRUBINUR Negative 06/23/2007 0832   KETONESUR NEGATIVE 09/25/2017 0640   PROTEINUR Positive (A) 05/25/2018 1249   PROTEINUR NEGATIVE 09/25/2017 0640   UROBILINOGEN 0.2 05/25/2018 1249   UROBILINOGEN 0.2 01/30/2009 0628   NITRITE negative 05/25/2018 1249   NITRITE NEGATIVE 09/25/2017 0640   LEUKOCYTESUR Negative 05/25/2018 1249   LEUKOCYTESUR Negative 06/23/2007 0832    Radiological Exams on Admission: Dg Chest Port 1 View  Result Date: 08/01/2018 CLINICAL DATA:  Shortness of breath and cough EXAM: PORTABLE CHEST 1 VIEW COMPARISON:  09/24/2017 FINDINGS: Normal heart size and mediastinal contours. No acute infiltrate or edema. No effusion or pneumothorax. No acute osseous findings. IMPRESSION: Negative chest. Electronically Signed   By: Monte Fantasia M.D.   On: 08/01/2018 08:27    EKG: Independently reviewed.  Normal sinus rhythm with tachycardia.  No acute ST-T wave changes.  Assessment/Plan Principal Problem:   Acute on chronic respiratory failure with hypoxia (HCC) Active Problems:   Obstructive sleep apnea   Essential hypertension   Diabetes mellitus, type II (HCC)   Paroxysmal A-fib (HCC)   COPD with acute exacerbation (HCC)   Acute respiratory failure (HCC)   Acute on chronic respiratory failure with hypoxia due to COPD exacerbation: Continue BiPAP as needed for respiratory distress.  Keep on oxygen to keep saturations more than 90%.  Admit to stepdown unit. Given severity of symptoms, though chest x-ray does not show any evidence of  pneumonia, will treat with antibiotics. Aggressive bronchodilator therapy with scheduled and as needed nebulizers, inhalational steroids, IV steroids.  Deep breathing exercises and incentive spirometry.  Obstructive sleep apnea, noncompliant to CPAP.  I discussed with patient and family to be compliant that may help with reducing exacerbations and shortness of breath.  Type 2 diabetes: On metformin at home.  Suspect exacerbation with use of steroids.  Will use insulin coverage.  Paroxysmal A. fib: Currently sinus rhythm.  Rate controlled.  Resume metoprolol.  Subtherapeutic on Coumadin.  Pharmacy to dose.  Hypertension: Stable.  Resume home medications.  DVT prophylaxis: SCDs.  Re-dose Coumadin. Code Status: Full code. Family Communication: Daughter at bedside. Disposition Plan: Home with home care. Consults called: None. Admission status: Inpatient, stepdown because of use of BiPAP.   Barb Merino MD [facility] Pager [phone]  If 7PM-7AM, please contact night-coverage [email or web address] Password [email or web address]  08/01/2018, 10:25 AM

## 2018-08-01 NOTE — Progress Notes (Signed)
Pt transported from ED 024 to 2W01 without incident.

## 2018-08-01 NOTE — ED Triage Notes (Addendum)
Hx of COPD- flu last week. Wears 2 L [place] at home. Started Friday but was able to treat at home. This morning woke up and couldn't move air. Home meds not helping. Mag 2 G and hour long and solumedrol. When on scene pt tripoding and not moving air.

## 2018-08-01 NOTE — Progress Notes (Signed)
ANTICOAGULATION CONSULT NOTE - Initial Consult  Pharmacy Consult for Warfarin Indication: atrial fibrillation  Allergies  Allergen Reactions  . Meprobamate Nausea And Vomiting    Patient Measurements:   Vital Signs: Temp: 97.7 F (36.5 C) (01/14 0800) Temp Source: Axillary (01/14 0800) BP: 136/89 (01/14 1015) Pulse Rate: 94 (01/14 1015)  Labs: Recent Labs    07/31/18 1127 08/01/18 0831  HGB  --  11.5*  HCT  --  39.4  PLT  --  259  INR 1.5*  --   CREATININE  --  0.74    CrCl cannot be calculated (Unknown ideal weight.).   Medical History: Past Medical History:  Diagnosis Date  . Allergic rhinitis   . ARF (acute renal failure) ([place]) 03/2017  . 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 Asthma   . CAD (coronary artery disease)   . CAD (coronary artery disease) 01/30/2007   stents trivial coronary artery disease diffusely, with a recent deployment od a intracoronary artery stent, 3.5 x 12 mm driver stent with no more than 20-30% in- stents restenosis.done by Dr [name] with re-look on novenber 10 2008 revealing a widely patent stent with otherwise trival CAD and normal LV function  . Carpal tunnel syndrome, right   . Chest pain 07/17/12009   2 D Echo EF >55%  . Complication of anesthesia    slow to wake up  . Depression   . Diabetes mellitus type II   . Gait instability   . History of kidney stones   . Hyperlipidemia   . Hypertension   . Iron deficiency anemia 07/13/2016  . Obesity   . Paroxysmal A-fib ([place])   . PTSD (post-traumatic stress disorder)   . Sleep apnea    cpap    Assessment: 68 yo female admitted with a COPD exacerbation. Pharmacy consulted to restart Coumadin for atrial fibrillation. INR on admission subtherapeutic at 1.5.  Unable to clearly identify home dose - only info that I found was: warfarin (COUMADIN) 7.5 MG tablet, TAKE ONE AND ONE-HALF TO TWO TABLETS BY MOUTH DAILY AS DIRECTED  Goal of Therapy:  INR 2-3   Plan:  Warfarin  15 mg po x 1 Monitor daily INR and CBC   Alanda Slim, PharmD, [facility] Clinical Pharmacist Please see AMION for all Pharmacists' Contact Phone Numbers 08/01/2018, 10:44 AM

## 2018-08-02 ENCOUNTER — Telehealth: Payer: Self-pay | Admitting: Pharmacy Technician

## 2018-08-02 ENCOUNTER — Telehealth: Payer: Self-pay | Admitting: Internal Medicine

## 2018-08-02 DIAGNOSIS — R0609 Other forms of dyspnea: Principal | ICD-10-CM

## 2018-08-02 DIAGNOSIS — R0600 Dyspnea, unspecified: Secondary | ICD-10-CM

## 2018-08-02 DIAGNOSIS — G4733 Obstructive sleep apnea (adult) (pediatric): Secondary | ICD-10-CM

## 2018-08-02 LAB — CBC
HCT: 34.8 % — ABNORMAL LOW (ref 36.0–46.0)
Hemoglobin: 9.9 g/dL — ABNORMAL LOW (ref 12.0–15.0)
MCH: 20.8 pg — ABNORMAL LOW (ref 26.0–34.0)
MCHC: 28.4 g/dL — ABNORMAL LOW (ref 30.0–36.0)
MCV: 73 fL — ABNORMAL LOW (ref 80.0–100.0)
Platelets: 249 10*3/uL (ref 150–400)
RBC: 4.77 MIL/uL (ref 3.87–5.11)
RDW: 19.5 % — ABNORMAL HIGH (ref 11.5–15.5)
WBC: 6.3 10*3/uL (ref 4.0–10.5)
nRBC: 0 % (ref 0.0–0.2)

## 2018-08-02 LAB — RESPIRATORY PANEL BY PCR
Adenovirus: NOT DETECTED
Bordetella pertussis: NOT DETECTED
CORONAVIRUS 229E-RVPPCR: NOT DETECTED
Chlamydophila pneumoniae: NOT DETECTED
Coronavirus HKU1: NOT DETECTED
Coronavirus NL63: NOT DETECTED
Coronavirus OC43: NOT DETECTED
Influenza A: NOT DETECTED
Influenza B: NOT DETECTED
METAPNEUMOVIRUS-RVPPCR: NOT DETECTED
Mycoplasma pneumoniae: NOT DETECTED
PARAINFLUENZA VIRUS 2-RVPPCR: NOT DETECTED
PARAINFLUENZA VIRUS 3-RVPPCR: NOT DETECTED
Parainfluenza Virus 1: NOT DETECTED
Parainfluenza Virus 4: NOT DETECTED
Respiratory Syncytial Virus: NOT DETECTED
Rhinovirus / Enterovirus: NOT DETECTED

## 2018-08-02 LAB — BASIC METABOLIC PANEL
Anion gap: 10 (ref 5–15)
BUN: 13 mg/dL (ref 8–23)
CO2: 28 mmol/L (ref 22–32)
CREATININE: 0.86 mg/dL (ref 0.44–1.00)
Calcium: 9.2 mg/dL (ref 8.9–10.3)
Chloride: 102 mmol/L (ref 98–111)
GFR calc non Af Amer: >60 mL/min (ref >60)
Glucose, Bld: 141 mg/dL — ABNORMAL HIGH (ref 70–99)
Potassium: 4.7 mmol/L (ref 3.5–5.1)
Sodium: 140 mmol/L (ref 135–145)

## 2018-08-02 LAB — PROTIME-INR
INR: 1.84
Prothrombin Time: 21 seconds — ABNORMAL HIGH (ref 11.4–15.2)

## 2018-08-02 LAB — GLUCOSE, CAPILLARY
GLUCOSE-CAPILLARY: 97 mg/dL (ref 70–99)
Glucose-Capillary: 109 mg/dL — ABNORMAL HIGH (ref 70–99)
Glucose-Capillary: 124 mg/dL — ABNORMAL HIGH (ref 70–99)
Glucose-Capillary: 110 mg/dL — ABNORMAL HIGH (ref 70–99)

## 2018-08-02 LAB — HIV ANTIBODY (ROUTINE TESTING W REFLEX): HIV Screen 4th Generation wRfx: NONREACTIVE

## 2018-08-02 MED ORDER — WARFARIN SODIUM 10 MG PO TABS
10.0000 mg | ORAL_TABLET | Freq: Once | ORAL | Status: AC
  Administered 2018-08-02: 10 mg via ORAL
  Filled 2018-08-02: qty 1

## 2018-08-02 MED ORDER — MENTHOL 3 MG MT LOZG
1.0000 | LOZENGE | OROMUCOSAL | Status: DC | PRN
  Filled 2018-08-02: qty 9

## 2018-08-02 MED ORDER — BENZONATATE 100 MG PO CAPS
100.0000 mg | ORAL_CAPSULE | Freq: Three times a day (TID) | ORAL | Status: DC
  Administered 2018-08-02 – 2018-08-10 (×24): 100 mg via ORAL
  Filled 2018-08-02 (×24): qty 1

## 2018-08-02 NOTE — Telephone Encounter (Signed)
[facility],caregiver, returned call as patient is in the hospital. CB is [phone].

## 2018-08-02 NOTE — Telephone Encounter (Signed)
Called and spoke with patients caregiver, she is currently in the hospital but she is needing tubing for her CPAP machine, her nebulizer, and her oxygen. Patients caregiver said that the tubing is needed one for each of them. CY please advise, thank you.

## 2018-08-02 NOTE — Progress Notes (Signed)
Nutrition Consult/Brief Note  RD consulted via COPD Focused Protocol.  Pt admitted COPD exacerbation (HCC) [J44.1] Acute respiratory failure, unspecified whether with hypoxia or hypercapnia ([place]) [J96.00].  Wt Readings from Last 15 Encounters:  08/01/18 126.1 kg  06/28/18 128.7 kg  06/26/18 128.4 kg  05/25/18 129.3 kg  04/25/18 130.4 kg  03/29/18 128.1 kg  01/03/18 129.5 kg  12/27/17 129.5 kg  11/24/17 127.9 kg  10/25/17 127.3 kg  10/03/17 125.5 kg  09/29/17 131.1 kg  07/27/17 131 kg  05/27/17 129.9 kg  05/06/17 125.6 kg   Body mass index is 50.85 kg/m. Patient meets criteria for Obesity Class III based on current BMI.   Current diet order is Carbohydrate Modified. Labs and medications reviewed.   No nutrition interventions warranted at this time. If nutrition issues arise, please consult RD.   Arthur Holms, RD, LDN Pager #: [phone] After-Hours Pager #: [phone]

## 2018-08-02 NOTE — Progress Notes (Signed)
Occupational Therapy Evaluation Patient Details Name: Michele [name] MRN: [id number] DOB: 11-Mar-1951 Today's Date: 08/02/2018    History of Present Illness Michele [name] is a 68 y.o. female with medical history significant of sleep apnea noncompliant to CPAP, paroxysmal A. 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 She has been experiencing ongoing shortness of breath and wheezing for about a month. Patient was admitted 1/14 with COPD exacerbation.     Clinical Impression   PTA, pt was living at home with her children and required min-mod assistance from her children and home health aide (3days/wk for 3-4hours/visit) for ADL/IADL. Pt reports she used 4 wheel RW, 2 wheel RW, and SPC for functional mobility. Pt was on 2L O2 during the day and CPAP at night. Pt currently requires minA for functional mobility at RW level. Pt able to complete LB dressing while seated after set-up assist. Anticipate pt will be able to safely return home with 24/7 assist and Michele [name]. If level of assist is unavailable, pt will benefit from services such as [facility] with [facility]. Pt will continue to benefit from OT services, will continue to follow acutely.     Follow Up Recommendations  Home health OT;Supervision/Assistance - 24 hour    Equipment Recommendations  3 in 1 bedside commode    Recommendations for Other Services PT consult     Precautions / Restrictions Precautions Precautions: Fall Restrictions Weight Bearing Restrictions: No      Mobility Bed Mobility Overal bed mobility: Modified Independent             General bed mobility comments: required increased time  Transfers Overall transfer level: Needs assistance Equipment used: Rolling walker (2 wheeled) Transfers: Sit to/from Stand Sit to Stand:  Min assist         General transfer comment: minA for initial powerup and initial stability in standing    Balance Overall balance assessment: Mild deficits observed, not formally tested                                         ADL either performed or assessed with clinical judgement   ADL Overall ADL's : Needs assistance/impaired Eating/Feeding: Set up   Grooming: Minimal assistance   Upper Body Bathing: Minimal assistance;Sitting   Lower Body Bathing: Minimal assistance;Sit to/from stand   Upper Body Dressing : Set up;Sitting   Lower Body Dressing: Set up;Sit to/from stand Lower Body Dressing Details (indicate cue type and reason): pt able to don/doff socks while seated Toilet Transfer: Minimal assistance;Ambulation Toilet Transfer Details (indicate cue type and reason): minA for initial powerup and stability with initial stand Toileting- Clothing Manipulation and Hygiene: Minimal assistance;Sit to/from stand   Tub/ Banker: Minimal assistance   Functional mobility during ADLs: Minimal assistance;Rolling walker General ADL Comments: began education on energy conservation strategies,     Vision         Perception     Praxis      Pertinent Vitals/Pain Pain Assessment: No/denies pain     Hand Dominance Right   Extremity/Trunk Assessment Upper Extremity Assessment Upper Extremity Assessment: Overall WFL for tasks assessed   Lower Extremity Assessment Lower Extremity Assessment: Defer to PT evaluation       Communication Communication Communication: No  difficulties   Cognition Arousal/Alertness: Awake/alert Behavior During Therapy: WFL for tasks assessed/performed Overall Cognitive Status: Within Functional Limits for tasks assessed                                     General Comments  pt's sister present during session    Exercises     Shoulder Instructions      Home Living Family/patient expects to be  discharged to:: Private residence Living Arrangements: Children Available Help at Discharge: Family Type of Home: House Home Access: Stairs to enter Technical brewer of Steps: 6 Entrance Stairs-Rails: Left;Right Home Layout: Two level;Able to live on main level with bedroom/bathroom     Bathroom Shower/Tub: Hospital doctor Toilet: Handicapped height     Home Equipment: Environmental consultant - 4 wheels;Cane - quad;Cane - single point;Other (comment);Grab bars - tub/shower(supplemental O2 2l)   Additional Comments: on 2L O2  24* at home      Prior Functioning/Environment Level of Independence: Needs assistance  Gait / Transfers Assistance Needed: RW; pt stated she has had previous falls, but they occurred "a long time ago and were due to TIAs" she stated she has not fallen recently ADL's / Homemaking Assistance Needed: [place] aide assists with bathing, grooming, IADL (3 days per week/3-4hours per visit). Daughter does meal prep and home management.    Comments: pt stated she has had difficulty completing exercises from previous OT and PT sessions due to increased fatigue. pt stated she is incontinent and has difficulty making it to the bathroom in time, pt uses adult diapers.         OT Problem List: Decreased activity tolerance;Impaired balance (sitting and/or standing);Cardiopulmonary status limiting activity      OT Treatment/Interventions: Energy conservation;Self-care/ADL training;Patient/family education    OT Goals(Current goals can be found in the care plan section) Acute Rehab OT Goals Patient Stated Goal: to get back to cooking OT Goal Formulation: With patient Time For Goal Achievement: 08/16/18 Potential to Achieve Goals: Good  OT Frequency: Min 2X/week   Barriers to D/C:            Co-evaluation              AM-PAC OT "6 Clicks" Daily Activity     Outcome Measure Help from another person eating meals?: None Help from another person taking care of  personal grooming?: A Little Help from another person toileting, which includes using toliet, bedpan, or urinal?: A Little Help from another person bathing (including washing, rinsing, drying)?: A Little Help from another person to put on and taking off regular upper body clothing?: A Little Help from another person to put on and taking off regular lower body clothing?: A Little 6 Click Score: 19   End of Session Equipment Utilized During Treatment: Gait belt;Rolling walker;Oxygen Nurse Communication: Mobility status  Activity Tolerance: Patient tolerated treatment well Patient left: in chair;with call bell/phone within reach;with family/visitor present  OT Visit Diagnosis: History of falling (Z91.81);Other abnormalities of gait and mobility (R26.89)                Time: 8127-5170 OT Time Calculation (min): 34 min Charges:  OT General Charges $OT Visit: 1 Visit OT Evaluation $OT Eval Low Complexity: 1 Low OT Treatments $Self Care/Home Management : 8-22 mins  Dorinda Hill OTR/L Acute Rehabilitation Services Office: [place] 08/02/2018, 3:52 PM

## 2018-08-02 NOTE — Progress Notes (Signed)
Physical Therapy Evaluation Patient Details Name: Michele [name] MRN: [id number] DOB: 10-29-1950 Today's Date: 08/02/2018   History of Present Illness  Michele [name] is a 68 y.o. female with medical history significant of sleep apnea noncompliant to CPAP, paroxysmal A. 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 She has been experiencing ongoing shortness of breath and wheezing for about a month. Patient was admitted 1/14 with COPD exacerbation.      Clinical Impression  Pt admitted with above diagnosis. Pt currently with functional limitations due to the deficits listed below (see PT Problem List). PTA, pt living home with daughter, 2-3 day/week visits with aide, ambulating home with RW, w/c for longer community distances. Reports some falls in past but states it has been 3 months since last fall. Patient at this time unclear of true 24/7 support at home, stating daughter may be on medical leave but that there might be some hours of the day she is unsupervised, left note with CM to confirm. Today ambulating short distances with contact guard, reports weaker than baseline. Feel going home with HHPT and 24/7 support is best for patient, she is agreeable.    Pt will benefit from skilled PT to increase their independence and safety with mobility to allow discharge to the venue listed below.       Follow Up Recommendations Home health PT;Supervision/Assistance - 24 hour(May require [facility]. )    Equipment Recommendations  None recommended by PT    Recommendations for Other Services       Precautions / Restrictions Precautions Precautions: Fall Restrictions Weight Bearing Restrictions: No      Mobility  Bed Mobility Overal bed mobility: Modified Independent             General bed mobility  comments: required increased time  Transfers Overall transfer level: Needs assistance Equipment used: Rolling walker (2 wheeled) Transfers: Sit to/from Stand Sit to Stand: Min assist         General transfer comment: minA for initial powerup and initial stability in standing  Ambulation/Gait Ambulation/Gait assistance: Min guard Gait Distance (Feet): 25 Feet Assistive device: Rolling walker (2 wheeled) Gait Pattern/deviations: Step-to pattern;Step-through pattern Gait velocity: decreased   General Gait Details: pt ambulating with RW, mild usnteadiness, safer with contact guard. VSS on 4L   Stairs            Wheelchair Mobility    Modified Rankin (Stroke Patients Only)       Balance Overall balance assessment: Mild deficits observed, not formally tested                                           Pertinent Vitals/Pain Pain Assessment: No/denies pain    Home Living Family/patient expects to be discharged to:: Private residence Living Arrangements: Children Available Help at Discharge: Family Type of Home: House Home Access: Stairs to enter Entrance Stairs-Rails: Chemical engineer of Steps: 6 Home Layout: Two level;Able to live on main level with bedroom/bathroom Home Equipment: Walker - 4 wheels;Cane - quad;Cane - single point;Other (comment);Grab bars - tub/shower Additional Comments: on 2L O2  24* at home    Prior Function Level of Independence: Needs assistance   Gait / Transfers Assistance Needed: RW; pt stated  she has had previous falls, but they occurred "a long time ago and were due to TIAs" she stated she has not fallen recently  ADL's / [place]: [place] aide assists with bathing, grooming, IADL (3 days per week/3-4hours per visit). Daughter does meal prep and home management.   Comments: Patients daugther on medical leave right now but will be returning to work. ambulating aroud home with RW, w/c for  community ambulation      Hand Dominance   Dominant Hand: Right    Extremity/Trunk Assessment   Upper Extremity Assessment Upper Extremity Assessment: Overall WFL for tasks assessed    Lower Extremity Assessment Lower Extremity Assessment: Generalized weakness       Communication   Communication: No difficulties  Cognition Arousal/Alertness: Awake/alert Behavior During Therapy: WFL for tasks assessed/performed Overall Cognitive Status: Within Functional Limits for tasks assessed                                        General Comments General comments (skin integrity, edema, etc.): pt sister present during session    Exercises     Assessment/Plan    PT Assessment Patient needs continued PT services  PT Problem List Decreased strength       PT Treatment Interventions DME instruction;Gait training;Stair training;Functional mobility training;Therapeutic activities;Therapeutic exercise    PT Goals (Current goals can be found in the Care Plan section)  Acute Rehab PT Goals Patient Stated Goal: to go home with additional support PT Goal Formulation: With patient Potential to Achieve Goals: Good    Frequency Min 3X/week   Barriers to discharge        Co-evaluation               AM-PAC PT "6 Clicks" Mobility  Outcome Measure Help needed turning from your back to your side while in a flat bed without using bedrails?: None Help needed moving from lying on your back to sitting on the side of a flat bed without using bedrails?: None Help needed moving to and from a bed to a chair (including a wheelchair)?: A Little Help needed standing up from a chair using your arms (e.g., wheelchair or bedside chair)?: A Little Help needed to walk in hospital room?: A Little Help needed climbing 3-5 steps with a railing? : A Lot 6 Click Score: 19    End of Session Equipment Utilized During Treatment: Gait belt Activity Tolerance: Patient tolerated  treatment well Patient left: in chair;with chair alarm set;with call bell/phone within reach;with nursing/sitter in room Nurse Communication: Mobility status PT Visit Diagnosis: Unsteadiness on feet (R26.81);Difficulty in walking, not elsewhere classified (R26.2);Other abnormalities of gait and mobility (R26.89)    Time: 3151-7616 PT Time Calculation (min) (ACUTE ONLY): 34 min   Charges:   PT Evaluation $PT Eval Low Complexity: 1 Low PT Treatments $Gait Training: 8-22 mins        Reinaldo Berber, PT, DPT Acute Rehabilitation Services Pager: [phone] Office: [phone]    Reinaldo Berber 08/02/2018, 6:21 PM

## 2018-08-02 NOTE — Progress Notes (Signed)
[facility] Progress Note  Patient: Michele [name] [facility] LFY:[id number]   PCP: [name], MD DOB: 1950-09-10   DOA: 08/01/2018   DOS: 08/02/2018   Date of Service: the patient was seen and examined on 08/02/2018  Brief hospital course: Pt. with PMH of sleep apnea noncompliant to CPAP, paroxysmal A. 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 Currently further plan is continue current care.  Subjective: off of BiPAP.  No nausea no vomiting no fever no chills.  Still has shortness of breath, significant coughing reported as well.  Would like to continue BiPAP for night.  Telemetry: Sinus tachycardia with PAC.  Assessment and Plan: Acute on chronic respiratory failure with hypoxia due to COPD exacerbation: Continue BiPAP as needed for respiratory distress.  Keep on oxygen to keep saturations more than 90%. Given severity of symptoms, though chest x-ray does not show any evidence of pneumonia, will treat with antibiotics. Aggressive bronchodilator therapy with scheduled and as needed nebulizers, inhalational steroids, IV steroids.  Deep breathing exercises and incentive spirometry.  Obstructive sleep apnea, noncompliant to CPAP.  I discussed with patient and family to be compliant that may help with reducing exacerbations and shortness of breath.  Type 2 diabetes: Uncontrolled with hyperglycemia. Secondary to steroids.  On metformin at home.  Suspect exacerbation with use of steroids.  Will use insulin coverage.  Paroxysmal A. fib: Currently sinus rhythm.  Rate controlled.  Resume metoprolol.  Subtherapeutic on Coumadin.  Pharmacy to dose.  Hypertension: Stable.   Resume home medications.  Anemia appears chronic.  No acute bleeding here in the hospital.  Etiology not clear. We will get further work-up.  Morbid obesity. Body mass index is 50.85 kg/m.  Dietary consultation.  Diet: cardiac diet DVT Prophylaxis: subcutaneous Heparin  Advance goals of care discussion: full code  Family Communication: no family was present at bedside, at the time of interview.   Disposition:  Discharge to home.  Consultants: none Procedures: none  Scheduled Meds: . ARIPiprazole  5 mg Oral Daily  . benzonatate  100 mg Oral TID  . guaiFENesin  600 mg Oral BID  . insulin aspart  0-5 Units Subcutaneous QHS  . insulin aspart  0-9 Units Subcutaneous TID WC  . ipratropium-albuterol  3 mL Nebulization Q6H  . metoprolol tartrate  25 mg Oral BID  . mometasone-formoterol  2 puff Inhalation BID  . pantoprazole  40 mg Oral Daily  . [START ON 08/03/2018] predniSONE  40 mg Oral Q breakfast  . sertraline  200 mg Oral Daily  . Warfarin - Pharmacist Dosing Inpatient   Does not apply q1800   Continuous Infusions: . cefTRIAXone (ROCEPHIN)  IV 1 g (08/02/18 1803)   PRN Meds: acetaminophen **OR** acetaminophen, albuterol, gabapentin, menthol-cetylpyridinium Antibiotics: Anti-infectives (From admission, onward)   Start     Dose/Rate Route Frequency Ordered Stop   08/01/18 1700  cefTRIAXone (ROCEPHIN) 1 g in sodium chloride 0.9 % 100 mL IVPB     1 g 200 mL/hr over 30 Minutes Intravenous Every 24 hours 08/01/18 1601 08/06/18 1659       Objective: Physical Exam: Vitals:   08/02/18 0855 08/02/18 1123 08/02/18 1334 08/02/18 1642  BP:  (!) 144/67  (!) 151/73  Pulse:  74  80  Resp:  17  (!) 26  Temp:  98.2 F (36.8 C)  98 F (36.7 C)  TempSrc:  Oral  Oral  SpO2: 98% 96% 98% 92%  Weight:      Height:        Intake/Output Summary (Last 24 hours) at 08/02/2018 1905 Last data filed at 08/01/2018 2214 Gross per 24 hour  Intake 120 ml  Output -  Net 120 ml   Filed  Weights   08/01/18 1900  Weight: 126.1 kg   General: Alert, Awake and Oriented to Time, Place and Person. Appear in moderate distress, affect appropriate Eyes: PERRL, Conjunctiva normal ENT: Oral Mucosa clear moist. Neck: no JVD, no Abnormal Mass Or lumps Cardiovascular: S1 and S2 Present, no Murmur, Peripheral Pulses Present Respiratory: increased respiratory effort, Bilateral Air entry equal and Decreased, no use of accessory muscle, bilateral  Crackles, bilateral  wheezes Abdomen: Bowel Sound present, Soft and no tenderness, no hernia Skin: no redness, no Rash, non induration Extremities: no Pedal edema, no calf tenderness Neurologic: Grossly no focal neuro deficit. Bilaterally Equal motor strength  Data Reviewed: CBC: Recent Labs  Lab 08/01/18 0831 08/02/18 0216  WBC 10.6* 6.3  NEUTROABS 5.3  --   HGB 11.5* 9.9*  HCT 39.4 34.8*  MCV 73.0* 73.0*  PLT 259 660   Basic Metabolic Panel: Recent Labs  Lab 08/01/18 0831 08/02/18 0216  NA 140 140  K 4.0 4.7  CL 104 102  CO2 25 28  GLUCOSE 129* 141*  BUN 10 13  CREATININE 0.74 0.86  CALCIUM 9.1 9.2    Liver Function Tests: Recent Labs  Lab 08/01/18 0831  AST 33  ALT 33  ALKPHOS 76  BILITOT 0.5  PROT 7.8  ALBUMIN 3.7   No results for input(s): LIPASE, AMYLASE in the last 168 hours. No results for input(s): AMMONIA in the last 168 hours. Coagulation Profile: Recent Labs  Lab 07/31/18 1127 08/02/18 0216  INR 1.5* 1.84   Cardiac Enzymes: No results for input(s): CKTOTAL, CKMB, CKMBINDEX, TROPONINI in the last 168 hours. BNP (last 3 results) No results for input(s): PROBNP in the last 8760 hours. CBG: Recent Labs  Lab 08/01/18 1712 08/01/18 2214 08/02/18 0719 08/02/18 1122 08/02/18 1638  GLUCAP 137* 137* 109* 97 124*   Studies: No results found.   Time spent: 35 minutes  Author: Berle Mull, MD [facility] 08/02/2018 7:05 PM  Between 7PM-7AM, please contact night-coverage at  [email or web address]

## 2018-08-02 NOTE — Telephone Encounter (Signed)
Called patient, unable to reach left message to give us a call back. 

## 2018-08-02 NOTE — Progress Notes (Signed)
ANTICOAGULATION CONSULT NOTE - Follow-Up Consult  Pharmacy Consult for Warfarin Indication: atrial fibrillation  Allergies  Allergen Reactions  . Meprobamate Nausea And Vomiting    Patient Measurements: Height: 5\' 2"  (157.5 cm) Weight: 278 lb (126.1 kg) IBW/kg (Calculated) : 50.1 Vital Signs: Temp: 97.3 F (36.3 C) (01/15 0720) Temp Source: Axillary (01/15 0720) BP: 158/95 (01/15 0720) Pulse Rate: 66 (01/15 0720)  Labs: Recent Labs    07/31/18 1127 08/01/18 0831 08/02/18 0216  HGB  --  11.5* 9.9*  HCT  --  39.4 34.8*  PLT  --  259 249  LABPROT  --   --  21.0*  INR 1.5*  --  1.84  CREATININE  --  0.74 0.86    Estimated Creatinine Clearance: 80.7 mL/min (by C-G formula based on SCr of 0.86 mg/dL).   Medical History: Past Medical History:  Diagnosis Date  . Allergic rhinitis   . ARF (acute renal failure) ([place]) 03/2017  . 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 Asthma   . CAD (coronary artery disease) 01/30/2007   stents trivial coronary artery disease diffusely, with a recent deployment od a intracoronary artery stent, 3.5 x 12 mm driver stent with no more than 20-30% in- stents restenosis.done by Dr [name] with re-look on novenber 10 2008 revealing a widely patent stent with otherwise trival CAD and normal LV function  . Carpal tunnel syndrome, right   . Chest pain 07/17/12009   2 D Echo EF >55%  . Chronic lower back pain   . Complication of anesthesia    slow to wake up  . COPD (chronic obstructive pulmonary disease) ([place])   . DDD (degenerative disc disease), lumbar    multilevel lumbar degenerative disc disease  . Depression   . Diabetes mellitus type II   . Gait instability   . GERD (gastroesophageal reflux disease)   . History of kidney stones   . History of stomach ulcers    "years ago" (08/01/2018)  . Hyperlipidemia   . Hypertension   . IBS (irritable bowel syndrome)   . Iron deficiency anemia 07/13/2016  . Obesity   . On  home oxygen therapy    "2L 24/7" (08/01/2018)  . OSA on CPAP    "w/O2" (08/01/2018)  . Paroxysmal A-fib ([place])   . PTSD (post-traumatic stress disorder)   . Stroke [name])    "not sure when; showed up on one of my tests" (08/01/2018)  . TIA (transient ischemic attack)    "several; they say I have small ones" (08/01/2018)  . Urinary incontinence     Assessment: 68 yo female admitted with a COPD exacerbation. Pharmacy consulted to restart Coumadin for atrial fibrillation.   The patient was seen in the anti-coagulation clinic on 1/13 and was found to have a SUBtherapeutic INR of 1.5 - the patient was instructed to take 15 mg on 1/13 - then resume PTA dosing of 11.25 mg daily EXCEPT for 7.5 mg on MWF.  INR today remains SUBtherapeutic (INR 1.84, goal of 2-3). The patient  presumably took 15 mg on 1/13 and was given 15 mg yesterday. INR approaching goal - will reduce the dose today to 10 mg and monitor INR response.   Goal of Therapy:  INR 2-3   Plan:  - Warfarin 10 mg x 1 dose at 1800 today - Will continue to monitor for any signs/symptoms of bleeding and will follow up with PT/INR in the a.m.  Thank you for allowing  pharmacy to be a part of this patient's care.  Alycia Rossetti, PharmD, BCPS Clinical Pharmacist Clinical phone for 08/02/2018: [phone] 08/02/2018 10:26 AM   **Pharmacist phone directory can now be found on [email or web address] ([email or web address]).  Listed under [place].

## 2018-08-02 NOTE — Telephone Encounter (Signed)
Ok to contact her DME to order replacement for these hoses, tubing and supplies for O2 and CPAP

## 2018-08-02 NOTE — Telephone Encounter (Signed)
Called and spoke with patients caregiver, she stated that the patient is in the hospital due to a severe asthma attack. Will route this over to CY as an [place].

## 2018-08-02 NOTE — Telephone Encounter (Signed)
Order has been placed. Nothing further needed. 

## 2018-08-03 ENCOUNTER — Other Ambulatory Visit: Payer: Self-pay | Admitting: Nurse Practitioner

## 2018-08-03 DIAGNOSIS — E782 Mixed hyperlipidemia: Secondary | ICD-10-CM

## 2018-08-03 LAB — CBC
HCT: 32.7 % — ABNORMAL LOW (ref 36.0–46.0)
Hemoglobin: 9.8 g/dL — ABNORMAL LOW (ref 12.0–15.0)
MCH: 21.5 pg — AB (ref 26.0–34.0)
MCHC: 30 g/dL (ref 30.0–36.0)
MCV: 71.9 fL — ABNORMAL LOW (ref 80.0–100.0)
Platelets: 259 10*3/uL (ref 150–400)
RBC: 4.55 MIL/uL (ref 3.87–5.11)
RDW: 19.4 % — ABNORMAL HIGH (ref 11.5–15.5)
WBC: 10.3 10*3/uL (ref 4.0–10.5)
nRBC: 0 % (ref 0.0–0.2)

## 2018-08-03 LAB — BASIC METABOLIC PANEL
Anion gap: 5 (ref 5–15)
BUN: 17 mg/dL (ref 8–23)
CO2: 34 mmol/L — ABNORMAL HIGH (ref 22–32)
Calcium: 9.2 mg/dL (ref 8.9–10.3)
Chloride: 100 mmol/L (ref 98–111)
Creatinine, Ser: 0.85 mg/dL (ref 0.44–1.00)
GFR calc Af Amer: >60 mL/min (ref >60)
GFR calc non Af Amer: >60 mL/min (ref >60)
Glucose, Bld: 99 mg/dL (ref 70–99)
Potassium: 4.4 mmol/L (ref 3.5–5.1)
Sodium: 139 mmol/L (ref 135–145)

## 2018-08-03 LAB — GLUCOSE, CAPILLARY
Glucose-Capillary: 93 mg/dL (ref 70–99)
Glucose-Capillary: 103 mg/dL — ABNORMAL HIGH (ref 70–99)
Glucose-Capillary: 118 mg/dL — ABNORMAL HIGH (ref 70–99)
Glucose-Capillary: 161 mg/dL — ABNORMAL HIGH (ref 70–99)

## 2018-08-03 LAB — PROTIME-INR
INR: 2.31
Prothrombin Time: 25 seconds — ABNORMAL HIGH (ref 11.4–15.2)

## 2018-08-03 LAB — MAGNESIUM: Magnesium: 2.2 mg/dL (ref 1.7–2.4)

## 2018-08-03 MED ORDER — WARFARIN SODIUM 7.5 MG PO TABS
7.5000 mg | ORAL_TABLET | Freq: Once | ORAL | Status: AC
  Administered 2018-08-03: 7.5 mg via ORAL
  Filled 2018-08-03: qty 1

## 2018-08-03 MED ORDER — CEFDINIR 300 MG PO CAPS
300.0000 mg | ORAL_CAPSULE | Freq: Two times a day (BID) | ORAL | Status: AC
  Administered 2018-08-03 – 2018-08-05 (×6): 300 mg via ORAL
  Filled 2018-08-03 (×6): qty 1

## 2018-08-03 MED ORDER — AZITHROMYCIN 250 MG PO TABS
500.0000 mg | ORAL_TABLET | Freq: Every day | ORAL | Status: DC
  Administered 2018-08-03 – 2018-08-04 (×2): 500 mg via ORAL
  Filled 2018-08-03 (×3): qty 2

## 2018-08-03 MED ORDER — ROSUVASTATIN CALCIUM 10 MG PO TABS: ORAL_TABLET | ORAL | 1 refills | Status: DC

## 2018-08-03 NOTE — Progress Notes (Signed)
Occupational Therapy Treatment Patient Details Name: Michele [name] MRN: [id number] DOB: [birth year] Today's Date: 08/03/2018    History of present illness Michele [name] is a 68 y.o. female with medical history significant of sleep apnea noncompliant to CPAP, paroxysmal A. 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 She has been experiencing ongoing shortness of breath and wheezing for about a month. Patient was admitted 1/14 with COPD exacerbation.    OT comments  Pt progressing toward goals. Pt currently requires min guard for functional mobility during ADL. Pt continues to require cueing for problem solving energy conservation strategies to implement during ADL. Pt's [place] aide was present during session, pt's nurse and pt were still unaware of level of supervision/assistance available upon discharge. Due to pt reporting feeling weaker than baseline and requiring min guard for functional mobility, pt is at increased risk for falls and will continue to benefit from OT services to maximize safety and independence with ADL and functional mobility. Continue to recommend discharge venue listed below.    Follow Up Recommendations  Home health OT;Supervision/Assistance - 24 hour (May benefit [facility])   Equipment Recommendations  3 in 1 bedside commode    Recommendations for Other Services      Precautions / Restrictions Precautions Precautions: Fall Restrictions Weight Bearing Restrictions: No       Mobility Bed Mobility Overal bed mobility: Modified Independent                Transfers Overall transfer level: Needs assistance Equipment used: Rolling walker (2 wheeled) Transfers: Sit to/from Stand Sit to Stand: Min guard         General transfer comment: min guard for stability      Balance Overall balance assessment: Mild deficits observed, not formally tested                                         ADL either performed or assessed with clinical judgement   ADL Overall ADL's : Needs assistance/impaired     Grooming: Oral care;Standing;Supervision/safety Grooming Details (indicate cue type and reason): S for safety; pt completed oral care independently                  Toilet Transfer: Min guard;Ambulation Toilet Transfer Details (indicate cue type and reason): min guard for transfer; simulated from EOB to recliner Toileting- Clothing Manipulation and Hygiene: Min guard;Cueing for safety       Functional mobility during ADLs: Min guard;Rolling walker General ADL Comments: pt completed oral care while standing at the sink, pt required cues for problem solving energy conservation strategies.      Vision       Perception     Praxis      Cognition Arousal/Alertness: Awake/alert Behavior During Therapy: WFL for tasks assessed/performed Overall Cognitive Status: Within Functional Limits for tasks assessed                                          Exercises     Shoulder Instructions       General Comments pt's [place] aide present during session;pt verbalized wanting to maintain strength and keep up  with her aerobic exercises, educated pt on completing exercises while seated to conserve energy.     Pertinent Vitals/ Pain       Pain Assessment: No/denies pain  Home Living                                          Prior Functioning/Environment              Frequency  Min 2X/week        Progress Toward Goals  OT Goals(current goals can now be found in the care plan section)  Progress towards OT goals: Progressing toward goals  Acute Rehab OT Goals Patient Stated Goal: to go home and get stronger OT Goal Formulation: With patient Time For Goal Achievement: 08/16/18 Potential to  Achieve Goals: Good ADL Goals Pt Will Perform Lower Body Dressing: with set-up Pt Will Transfer to Toilet: with modified independence Additional ADL Goal #1: Pt will demonstrate 3 energy conservation strategies during ADL.  Plan Discharge plan remains appropriate    Co-evaluation                 AM-PAC OT "6 Clicks" Daily Activity     Outcome Measure   Help from another person eating meals?: None Help from another person taking care of personal grooming?: A Little Help from another person toileting, which includes using toliet, bedpan, or urinal?: A Little Help from another person bathing (including washing, rinsing, drying)?: A Little Help from another person to put on and taking off regular upper body clothing?: A Little Help from another person to put on and taking off regular lower body clothing?: A Little 6 Click Score: 19    End of Session Equipment Utilized During Treatment: Gait belt;Rolling walker;Oxygen(3L [place])  OT Visit Diagnosis: History of falling (Z91.81);Other abnormalities of gait and mobility (R26.89)   Activity Tolerance Patient tolerated treatment well   Patient Left in chair;with call bell/phone within reach;with nursing/sitter in room;with family/visitor present   Nurse Communication Mobility status        Time: 6803-2122 OT Time Calculation (min): 24 min  Charges: OT Treatments $Self Care/Home Management : 23-37 mins  Dorinda Hill OTR/L Acute Rehabilitation Services Office: [place] 08/03/2018, 11:07 AM

## 2018-08-03 NOTE — Care Management Note (Addendum)
Case Management Note  Patient Details  Name: Bryttney Netzer MRN: [id number] Date of Birth: 1951/05/24  Subjective/Objective:    From home with daughter, she has home oxygen  2 liters with [employer].  Acute Resp failure secondary to copd,ex, OSA, dm2, PAF (COUMADIN).  Daughter will check with her kids to make sure they will be able to be with patient while she is out of town.  NCM offered choice to patient, daughter at bedside chose [facility] for [facility], [place], [place],  From Medicare.gove list, referral given to [place] with [facility].  Awaiting call back to make sure they can take referral. Will need HHRN, HHPT, HHOT orders with [id number] prior to dc.   Has walker at home .  Tiffany with [facility] states they are able to take her.            Action/Plan: DC home with [facility] services when ready.  Expected Discharge Date:                  Expected Discharge Plan:  [place]  In-House Referral:     Discharge planning Services  CM Consult  Post Acute Care Choice:  Home Health Choice offered to:  Adult Children  DME Arranged:    DME Agency:     HH Arranged:  RN, Disease Management, PT, OT HH Agency:  [facility]  Status of Service:  Completed, signed off  If discussed at [employer] of Stay Meetings, dates discussed:    Additional Comments:  Zenon Mayo, RN 08/03/2018, 3:33 PM

## 2018-08-03 NOTE — Progress Notes (Signed)
ANTICOAGULATION CONSULT NOTE - Follow-Up Consult  Pharmacy Consult for Warfarin Indication: atrial fibrillation  Allergies  Allergen Reactions  . Meprobamate Nausea And Vomiting    Patient Measurements: Height: 5\' 2"  (157.5 cm) Weight: 278 lb (126.1 kg) IBW/kg (Calculated) : 50.1 Vital Signs: Temp: 98.6 F (37 C) (01/16 0718) Temp Source: Axillary (01/16 0718) BP: 160/88 (01/16 0718) Pulse Rate: 72 (01/16 0718)  Labs: Recent Labs    07/31/18 1127  08/01/18 0831 08/02/18 0216 08/03/18 0453  HGB  --    < > 11.5* 9.9* 9.8*  HCT  --   --  39.4 34.8* 32.7*  PLT  --   --  259 249 259  LABPROT  --   --   --  21.0* 25.0*  INR 1.5*  --   --  1.84 2.31  CREATININE  --   --  0.74 0.86 0.85   < > = values in this interval not displayed.    Estimated Creatinine Clearance: 81.6 mL/min (by C-G formula based on SCr of 0.85 mg/dL).   Medical History: Past Medical History:  Diagnosis Date  . Allergic rhinitis   . ARF (acute renal failure) ([place]) 03/2017  . 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 Asthma   . CAD (coronary artery disease) 01/30/2007   stents trivial coronary artery disease diffusely, with a recent deployment od a intracoronary artery stent, 3.5 x 12 mm driver stent with no more than 20-30% in- stents restenosis.done by Dr [name] with re-look on novenber 10 2008 revealing a widely patent stent with otherwise trival CAD and normal LV function  . Carpal tunnel syndrome, right   . Chest pain 07/17/12009   2 D Echo EF >55%  . Chronic lower back pain   . Complication of anesthesia    slow to wake up  . COPD (chronic obstructive pulmonary disease) ([place])   . DDD (degenerative disc disease), lumbar    multilevel lumbar degenerative disc disease  . Depression   . Diabetes mellitus type II   . Gait instability   . GERD (gastroesophageal reflux disease)   . History of kidney stones   . History of stomach ulcers    "years ago" (08/01/2018)  .  Hyperlipidemia   . Hypertension   . IBS (irritable bowel syndrome)   . Iron deficiency anemia 07/13/2016  . Obesity   . On home oxygen therapy    "2L 24/7" (08/01/2018)  . OSA on CPAP    "w/O2" (08/01/2018)  . Paroxysmal A-fib ([place])   . PTSD (post-traumatic stress disorder)   . Stroke [facility])    "not sure when; showed up on one of my tests" (08/01/2018)  . TIA (transient ischemic attack)    "several; they say I have small ones" (08/01/2018)  . Urinary incontinence     Assessment: 68 yo female admitted with a COPD exacerbation. Pharmacy consulted to restart Coumadin for atrial fibrillation.   The patient was seen in the anti-coagulation clinic on 1/13 and was found to have a SUBtherapeutic INR of 1.5 - the patient was instructed to take 15 mg on 1/13 - then resume PTA dosing of 11.25 mg daily EXCEPT for 7.5 mg on MWF.  INR today is therapeutic (INR 2.31, goal of 2-3). Hgb stable at 9.8 - no bleeding noted.  Goal of Therapy:  INR 2-3   Plan:  - Warfarin 7.5 mg x 1 dose at 1800 today - Will continue to monitor for any signs/symptoms of  bleeding and will follow up with PT/INR in the a.m.  Thank you for allowing pharmacy to be a part of this patient's care.  Alanda Slim, PharmD, [facility] Clinical Pharmacist Please see AMION for all Pharmacists' Contact Phone Numbers 08/03/2018, 7:49 AM

## 2018-08-03 NOTE — Progress Notes (Signed)
Physical Therapy Treatment Patient Details Name: Michele [name] MRN: [id number] DOB: 1951/01/09 Today's Date: 08/03/2018    History of Present Illness Michele [name] is a 68 y.o. female with medical history significant of sleep apnea noncompliant to CPAP, paroxysmal A. 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 She has been experiencing ongoing shortness of breath and wheezing for about a month. Patient was admitted 1/14 with COPD exacerbation.     PT Comments    Patient progressing with therapy ambulating slightly further distances with improved balance. Daughter present and discussed at length level of assistance PT recommending at home, she reports she will have patchwork of family/aide/HH services to provide level of support for her mother to maximize safety.    Follow Up Recommendations  Home health PT;Supervision/Assistance - 24 hour (max HH services).      Equipment Recommendations  None recommended by PT    Recommendations for Other Services       Precautions / Restrictions Precautions Precautions: Fall Restrictions Weight Bearing Restrictions: No    Mobility  Bed Mobility Overal bed mobility: Modified Independent                Transfers Overall transfer level: Needs assistance Equipment used: Rolling walker (2 wheeled) Transfers: Sit to/from Stand Sit to Stand: Min guard         General transfer comment: min guard for stability   Ambulation/Gait Ambulation/Gait assistance: Min guard Gait Distance (Feet): 20 Feet Assistive device: Rolling walker (2 wheeled) Gait Pattern/deviations: Step-to pattern;Step-through pattern     General Gait Details: ambulating to bathroom and back, VSS on 3L. no overt LOB, mild unsteadiness improving from yesterday, daughter present and  discussed safey concerns and considerations   Stairs             Wheelchair Mobility    Modified Rankin (Stroke Patients Only)       Balance Overall balance assessment: Mild deficits observed, not formally tested                                          Cognition Arousal/Alertness: Awake/alert Behavior During Therapy: WFL for tasks assessed/performed Overall Cognitive Status: Within Functional Limits for tasks assessed                                        Exercises      General Comments General comments (skin integrity, edema, etc.): daughter present extensive discussion over level of support at home and reducing risk of falls with home set up changes.       Pertinent Vitals/Pain Pain Assessment: No/denies pain    Home Living                      Prior Function            PT Goals (current goals can now be found in the care plan section) Acute Rehab PT Goals Patient Stated Goal: to go home and get stronger PT Goal Formulation: With patient Potential to Achieve Goals: Good Progress towards PT goals: Progressing toward goals    Frequency    Min 3X/week  PT Plan      Co-evaluation              AM-PAC PT "6 Clicks" Mobility   Outcome Measure  Help needed turning from your back to your side while in a flat bed without using bedrails?: None Help needed moving from lying on your back to sitting on the side of a flat bed without using bedrails?: None Help needed moving to and from a bed to a chair (including a wheelchair)?: A Little Help needed standing up from a chair using your arms (e.g., wheelchair or bedside chair)?: A Little Help needed to walk in hospital room?: A Little Help needed climbing 3-5 steps with a railing? : A Lot 6 Click Score: 19    End of Session Equipment Utilized During Treatment: Gait belt Activity Tolerance: Patient tolerated treatment well Patient left: in chair;with  chair alarm set;with call bell/phone within reach;with nursing/sitter in room Nurse Communication: Mobility status PT Visit Diagnosis: Unsteadiness on feet (R26.81);Difficulty in walking, not elsewhere classified (R26.2);Other abnormalities of gait and mobility (R26.89)     Time: 1530-1600 PT Time Calculation (min) (ACUTE ONLY): 30 min  Charges:  $Gait Training: 8-22 mins $Self Care/Home Management: 8-22                     Reinaldo Berber, PT, DPT Acute Rehabilitation Services Pager: [phone] Office: [phone]     Reinaldo Berber 08/03/2018, 4:07 PM

## 2018-08-03 NOTE — Progress Notes (Signed)
[facility] Progress Note  Patient: Michele [name] [facility] VHQ:[id number]   PCP: [name], MD DOB: [birth year]   DOA: 08/01/2018   DOS: 08/03/2018   Date of Service: the patient was seen and examined on 08/03/2018  Brief hospital course: Pt. with PMH of sleep apnea noncompliant to CPAP, paroxysmal A. 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 Currently further plan is continue current care.  Subjective: Breathing better, cough better nausea no vomiting.  Telemetry: Sinus tachycardia with PAC.  Assessment and Plan: Acute on chronic respiratory failure with hypoxia due to COPD exacerbation: Continue BiPAP as needed for respiratory distress.  Keep on oxygen to keep saturations more than 90%. Given severity of symptoms, though chest x-ray does not show any evidence of pneumonia, will treat with antibiotics.  Switch to oral Aggressive bronchodilator therapy with scheduled and as needed nebulizers, inhalational steroids, oral steroids.  Obstructive sleep apnea, noncompliant to CPAP.  I discussed with patient and family to be compliant that may help with reducing exacerbations and shortness of breath.  Type 2 diabetes: Uncontrolled with hyperglycemia. Secondary to steroids. On metformin at home.  Suspect exacerbation with use of steroids.  Will use insulin coverage.  Paroxysmal A. fib: Currently sinus rhythm.  Rate controlled.  Resume metoprolol.  Subtherapeutic on Coumadin.  Pharmacy to dose.  Hypertension: Stable.  Resume home medications.  Anemia appears chronic.  No acute bleeding here in the hospital.  Etiology not clear. We will get further work-up.  H&H  stable  Morbid obesity. Body mass index is 50.85 kg/m.  Dietary consultation.  Diet: cardiac diet DVT Prophylaxis: subcutaneous Heparin  Advance goals of care discussion: full code  Family Communication: family was present at bedside, at the time of interview.   Disposition:  Discharge to home.  Consultants: none Procedures: none  Scheduled Meds: . ARIPiprazole  5 mg Oral Daily  . azithromycin  500 mg Oral Daily  . benzonatate  100 mg Oral TID  . cefdinir  300 mg Oral Q12H  . guaiFENesin  600 mg Oral BID  . insulin aspart  0-5 Units Subcutaneous QHS  . insulin aspart  0-9 Units Subcutaneous TID WC  . ipratropium-albuterol  3 mL Nebulization Q6H  . metoprolol tartrate  25 mg Oral BID  . mometasone-formoterol  2 puff Inhalation BID  . pantoprazole  40 mg Oral Daily  . predniSONE  40 mg Oral Q breakfast  . sertraline  200 mg Oral Daily  . Warfarin - Pharmacist Dosing Inpatient   Does not apply q1800   Continuous Infusions:  PRN Meds: acetaminophen **OR** acetaminophen, albuterol, gabapentin, menthol-cetylpyridinium Antibiotics: Anti-infectives (From admission, onward)   Start     Dose/Rate Route Frequency Ordered Stop   08/03/18 1000  cefdinir (OMNICEF) capsule 300 mg     300 mg Oral Every 12 hours 08/03/18 0752 08/06/18 0959   08/03/18 1000  azithromycin (ZITHROMAX) tablet 500 mg     500 mg Oral Daily 08/03/18 0752 08/08/18 0959   08/01/18 1700  cefTRIAXone (ROCEPHIN) 1 g in sodium chloride 0.9 % 100 mL IVPB  Status:  Discontinued     1 g 200 mL/hr over 30 Minutes Intravenous Every 24 hours 08/01/18 1601 08/03/18 0752  Objective: Physical Exam: Vitals:   08/03/18 0305 08/03/18 0718 08/03/18 0937 08/03/18 1725  BP: (!) 144/78 (!) 160/88  (!) 130/92  Pulse: 80 72  79  Resp:  17    Temp:  98.6 F (37 C)  98.3 F (36.8 C)  TempSrc:  Axillary  Oral  SpO2:  99% (!) 89% 96%  Weight:      Height:       No intake or output data in the 24 hours ending  08/03/18 1843 Filed Weights   08/01/18 1900  Weight: 126.1 kg   General: Alert, Awake and Oriented to Time, Place and Person. Appear in moderate distress, affect appropriate Eyes: PERRL, Conjunctiva normal ENT: Oral Mucosa clear moist. Neck: no JVD, no Abnormal Mass Or lumps Cardiovascular: S1 and S2 Present, no Murmur, Peripheral Pulses Present Respiratory: increased respiratory effort, Bilateral Air entry equal and Decreased, no use of accessory muscle, bilateral  Crackles, bilateral  wheezes Abdomen: Bowel Sound present, Soft and no tenderness, no hernia Skin: no redness, no Rash, non induration Extremities: no Pedal edema, no calf tenderness Neurologic: Grossly no focal neuro deficit. Bilaterally Equal motor strength  Data Reviewed: CBC: Recent Labs  Lab 08/01/18 0831 08/02/18 0216 08/03/18 0453  WBC 10.6* 6.3 10.3  NEUTROABS 5.3  --   --   HGB 11.5* 9.9* 9.8*  HCT 39.4 34.8* 32.7*  MCV 73.0* 73.0* 71.9*  PLT 259 249 887   Basic Metabolic Panel: Recent Labs  Lab 08/01/18 0831 08/02/18 0216 08/03/18 0453  NA 140 140 139  K 4.0 4.7 4.4  CL 104 102 100  CO2 25 28 34*  GLUCOSE 129* 141* 99  BUN 10 13 17   CREATININE 0.74 0.86 0.85  CALCIUM 9.1 9.2 9.2  MG  --   --  2.2    Liver Function Tests: Recent Labs  Lab 08/01/18 0831  AST 33  ALT 33  ALKPHOS 76  BILITOT 0.5  PROT 7.8  ALBUMIN 3.7   No results for input(s): LIPASE, AMYLASE in the last 168 hours. No results for input(s): AMMONIA in the last 168 hours. Coagulation Profile: Recent Labs  Lab 07/31/18 1127 08/02/18 0216 08/03/18 0453  INR 1.5* 1.84 2.31   Cardiac Enzymes: No results for input(s): CKTOTAL, CKMB, CKMBINDEX, TROPONINI in the last 168 hours. BNP (last 3 results) No results for input(s): PROBNP in the last 8760 hours. CBG: Recent Labs  Lab 08/02/18 1638 08/02/18 2130 08/03/18 0717 08/03/18 1209 08/03/18 1709  GLUCAP 124* 110* 93 103* 118*   Studies: No results found.    Time spent: 35 minutes  Author: Berle Mull, MD [facility] 08/03/2018 6:43 PM  Between 7PM-7AM, please contact night-coverage at [email or web address]

## 2018-08-04 ENCOUNTER — Encounter (HOSPITAL_COMMUNITY): Payer: Self-pay | Admitting: Cardiology

## 2018-08-04 DIAGNOSIS — R0789 Other chest pain: Secondary | ICD-10-CM

## 2018-08-04 DIAGNOSIS — J441 Chronic obstructive pulmonary disease with (acute) exacerbation: Principal | ICD-10-CM

## 2018-08-04 DIAGNOSIS — I480 Paroxysmal atrial fibrillation: Secondary | ICD-10-CM

## 2018-08-04 DIAGNOSIS — J9621 Acute and chronic respiratory failure with hypoxia: Secondary | ICD-10-CM

## 2018-08-04 DIAGNOSIS — I10 Essential (primary) hypertension: Secondary | ICD-10-CM

## 2018-08-04 LAB — IRON AND TIBC
Iron: 33 ug/dL (ref 28–170)
Saturation Ratios: 13 % (ref 10.4–31.8)
TIBC: 260 ug/dL (ref 250–450)
UIBC: 227 ug/dL

## 2018-08-04 LAB — CBC
HCT: 33.2 % — ABNORMAL LOW (ref 36.0–46.0)
Hemoglobin: 9.9 g/dL — ABNORMAL LOW (ref 12.0–15.0)
MCH: 21.2 pg — ABNORMAL LOW (ref 26.0–34.0)
MCHC: 29.8 g/dL — ABNORMAL LOW (ref 30.0–36.0)
MCV: 71.2 fL — ABNORMAL LOW (ref 80.0–100.0)
NRBC: 0 % (ref 0.0–0.2)
Platelets: 270 10*3/uL (ref 150–400)
RBC: 4.66 MIL/uL (ref 3.87–5.11)
RDW: 19.2 % — ABNORMAL HIGH (ref 11.5–15.5)
WBC: 8.7 10*3/uL (ref 4.0–10.5)

## 2018-08-04 LAB — GLUCOSE, CAPILLARY
Glucose-Capillary: 87 mg/dL (ref 70–99)
Glucose-Capillary: 112 mg/dL — ABNORMAL HIGH (ref 70–99)
Glucose-Capillary: 151 mg/dL — ABNORMAL HIGH (ref 70–99)
Glucose-Capillary: 119 mg/dL — ABNORMAL HIGH (ref 70–99)

## 2018-08-04 LAB — PROTIME-INR
INR: 2.1
Prothrombin Time: 23.3 seconds — ABNORMAL HIGH (ref 11.4–15.2)

## 2018-08-04 LAB — TROPONIN I
Troponin I: <0.03 ng/mL (ref <0.03)
Troponin I: <0.03 ng/mL (ref <0.03)

## 2018-08-04 LAB — BASIC METABOLIC PANEL
Anion gap: 8 (ref 5–15)
BUN: 20 mg/dL (ref 8–23)
CO2: 32 mmol/L (ref 22–32)
Calcium: 9.2 mg/dL (ref 8.9–10.3)
Chloride: 101 mmol/L (ref 98–111)
Creatinine, Ser: 0.78 mg/dL (ref 0.44–1.00)
GFR calc non Af Amer: >60 mL/min (ref >60)
Glucose, Bld: 101 mg/dL — ABNORMAL HIGH (ref 70–99)
Potassium: 4.5 mmol/L (ref 3.5–5.1)
Sodium: 141 mmol/L (ref 135–145)

## 2018-08-04 LAB — FERRITIN: Ferritin: 255 ng/mL (ref 11–307)

## 2018-08-04 MED ORDER — ASPIRIN EC 81 MG PO TBEC
81.0000 mg | DELAYED_RELEASE_TABLET | Freq: Every day | ORAL | Status: DC
  Administered 2018-08-04 – 2018-08-10 (×7): 81 mg via ORAL
  Filled 2018-08-04 (×7): qty 1

## 2018-08-04 MED ORDER — ONDANSETRON HCL 4 MG/2ML IJ SOLN
4.0000 mg | Freq: Four times a day (QID) | INTRAMUSCULAR | Status: DC | PRN
  Administered 2018-08-04: 4 mg via INTRAVENOUS
  Filled 2018-08-04: qty 2

## 2018-08-04 MED ORDER — WARFARIN SODIUM 7.5 MG PO TABS
7.5000 mg | ORAL_TABLET | Freq: Once | ORAL | Status: AC
  Administered 2018-08-04: 7.5 mg via ORAL
  Filled 2018-08-04: qty 1

## 2018-08-04 MED ORDER — MORPHINE SULFATE 15 MG PO TABS
15.0000 mg | ORAL_TABLET | ORAL | Status: DC | PRN
  Administered 2018-08-04: 15 mg via ORAL
  Filled 2018-08-04: qty 1

## 2018-08-04 MED ORDER — PROMETHAZINE HCL 25 MG/ML IJ SOLN
12.5000 mg | Freq: Once | INTRAMUSCULAR | Status: AC
  Administered 2018-08-04: 12.5 mg via INTRAVENOUS
  Filled 2018-08-04: qty 1

## 2018-08-04 MED ORDER — HYDRALAZINE HCL 20 MG/ML IJ SOLN
10.0000 mg | INTRAMUSCULAR | Status: DC | PRN
  Administered 2018-08-04: 10 mg via INTRAVENOUS
  Filled 2018-08-04: qty 1

## 2018-08-04 MED ORDER — MORPHINE SULFATE 15 MG PO TABS
15.0000 mg | ORAL_TABLET | ORAL | Status: DC | PRN
  Administered 2018-08-05 – 2018-08-06 (×3): 15 mg via ORAL
  Filled 2018-08-04 (×3): qty 1

## 2018-08-04 MED ORDER — MORPHINE SULFATE (PF) 2 MG/ML IV SOLN
2.0000 mg | INTRAVENOUS | Status: DC | PRN
  Administered 2018-08-07: 2 mg via INTRAVENOUS
  Filled 2018-08-04 (×3): qty 1

## 2018-08-04 NOTE — Progress Notes (Signed)
Completed 12 lead EKG, results placed in chart. Pt c/o nausea and vomiting with chest pain. MD notified, see new orders.   08/04/18 1850  Vitals  BP (!) 175/102  BP Method Automatic  Pulse Rate 98  Pulse Rate Source Monitor  ECG Heart Rate 84

## 2018-08-04 NOTE — Care Management Important Message (Signed)
Important Message  Patient Details  Name: Michele [name] MRN: [id number] Date of Birth: 20-Feb-1951   Medicare Important Message Given:  Yes    Marylee Belzer [address] 08/04/2018, 2:46 PM

## 2018-08-04 NOTE — Progress Notes (Signed)
RT took patient off bipap and placed on 2L [place]. Patient is tolerating well and RT will continue to monitor as needed.

## 2018-08-04 NOTE — Consult Note (Addendum)
Cardiology Consultation:   Patient ID: Michele [name] MRN: [id number]; DOB: 07-22-1950  Admit date: 08/01/2018 Date of Consult: 08/04/2018  Primary Care Provider: [name], MD Primary Cardiologist: Quay Burow, MD  Primary Electrophysiologist:  None    Patient Profile:   Michele [name] is a 68 y.o. female with a hx of CAD with stent to pRCA 2008, DM-2, HTN, sleep apnea, asthma, PAF, HLD and arthritis who is being seen today for the evaluation of chest pain at the request of Dr. Posey Pronto.  History of Present Illness:   [name] with above hx and CAD with stent to pRCA in 2008.  Last nuc study was 2014 and non ischemic, normal LV function.  She had been maintaining SR on coumadin.  Now admitted with wheezing and SOB 08/01/18. + severe respiratory distress, placed on BiPap.  She had been on ABX per pulmonology.   Nebs at home did not help on the 14th so came to ER. She has improved with therapy.  She has maintained SR.     Na 141, k+ 4.5, Cr 0.78 Hgb 9.9 WBC 8.7, plts 270  INR today 2.10  Troponin <0.03  today  EKG SR with inverted  Mild T waves ant lat, new.  At home she has some chest pain at times and takes NTG sl and feels better.  With; this pain it increases with deep breath and cough.  She thought it was from her lung issues but now the chest pain seems increased.    The tylenol is not helping.  No nausea or diaphoresis and her SOB does not increase.  She has not had NTG   BP is 13/79 to 179/105.  Her metoprolol has been resumed.   Past Medical History:  Diagnosis Date  . Allergic rhinitis   . ARF (acute renal failure) ([place]) 03/2017  . 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 Asthma   . CAD (coronary artery disease) 01/30/2007   stents trivial coronary artery disease diffusely, with a recent deployment od a intracoronary artery stent, 3.5 x 12 mm driver stent with no more than 20-30% in- stents restenosis.done by Dr [name] with re-look on novenber 10 2008 revealing a widely patent stent with otherwise trival CAD and normal LV function  . Carpal tunnel syndrome, right   . Chest pain 07/17/12009   2 D Echo EF >55%  . Chronic lower back pain   . Complication of anesthesia    slow to wake up  . COPD (chronic obstructive pulmonary disease) ([place])   . DDD (degenerative disc disease), lumbar    multilevel lumbar degenerative disc disease  . Depression   . Diabetes mellitus type II   . Gait instability   . GERD (gastroesophageal reflux disease)   . History of kidney stones   . History of stomach ulcers    "years ago" (08/01/2018)  . Hyperlipidemia   . Hypertension   . IBS (irritable bowel syndrome)   . Iron deficiency anemia 07/13/2016  . Obesity   . On home oxygen therapy    "2L 24/7" (08/01/2018)  . OSA on CPAP    "w/O2" (08/01/2018)  . Paroxysmal A-fib ([place])   . PTSD (post-traumatic stress disorder)   . Stroke [facility])    "not sure when; showed up on one of my tests" (08/01/2018)  . TIA (transient ischemic attack)    "several; they say I have small ones" (08/01/2018)  . Urinary incontinence  Past Surgical History:  Procedure Laterality Date  . CARDIAC CATHETERIZATION    . CHOLECYSTECTOMY    . COLONOSCOPY WITH PROPOFOL N/A 11/18/2017   Procedure: COLONOSCOPY WITH PROPOFOL;  Surgeon: Carol Ada, MD;  Location: [facility];  Service: Endoscopy;  Laterality: N/A;  . CORONARY ANGIOPLASTY WITH STENT PLACEMENT    . CYST EXCISION Left    groin  . CYSTOSCOPY W/ STONE MANIPULATION    . ESOPHAGOGASTRODUODENOSCOPY N/A 09/25/2017   Procedure: ESOPHAGOGASTRODUODENOSCOPY (EGD);  Surgeon: Clarene Essex, MD;  Location: [facility];  Service: Endoscopy;  Laterality: N/A;  . EXCISIONAL HEMORRHOIDECTOMY    . stress myocardial perfusion study  09/08/2010   normal; EF 75%     Home Medications:  Prior to Admission medications   Medication Sig Start Date End Date Taking? Authorizing Provider  ADVAIR DISKUS  250-50 MCG/DOSE AEPB INHALE ONE PUFF TWICE DAILY 06/03/16  Yes Young, Clinton D, MD  albuterol (VENTOLIN HFA) 108 (90 Base) MCG/ACT inhaler Inhale 1 puff into the lungs every 6 (six) hours as needed for wheezing or shortness of breath. Patient taking differently: Inhale 2 puffs into the lungs every 6 (six) hours as needed for wheezing or shortness of breath.  10/18/16  Yes Curt Bears, MD  ARIPiprazole (ABILIFY) 5 MG tablet Take 1 tablet (5 mg total) by mouth daily. 05/16/18  Yes Arfeen, Arlyce Harman, MD  FeFum-FePoly-FA-B Cmp-C-Biot (INTEGRA PLUS) CAPS Take 1 capsule by mouth every morning. 06/28/18  Yes Curt Bears, MD  gabapentin (NEURONTIN) 300 MG capsule Take 300 mg by mouth 3 (three) times daily as needed (for pain).  07/04/15  Yes [provider]  ipratropium-albuterol (DUONEB) 0.5-2.5 (3) MG/3ML SOLN USE 3 ML IN NEBULIZER EVERY 6 HOURS AS NEEDED DX: J45.20 06/29/18  Yes Parrett, Tammy S, NP  MAGNESIUM GLUCONATE PO Take by mouth.   Yes [provider]  metFORMIN (GLUCOPHAGE-XR) 500 MG 24 hr tablet TAKE ONE (1) TABLET BY MOUTH EVERY DAY WITH BREAKFAST Patient taking differently: Take 500 mg by mouth daily with breakfast.  11/28/15  Yes Daub, Loura Back, MD  metoprolol tartrate (LOPRESSOR) 25 MG tablet Take 1 tablet (25 mg total) by mouth 2 (two) times daily. 03/07/18  Yes Lorretta Harp, MD  nitroGLYCERIN (NITROSTAT) 0.4 MG SL tablet PLACE 1 TABLET UNDER THE TONGUE EVERY 5 MINUTES AS NEEDED FOR CHEST PAIN.  MAY REPEAT 3 TIMES.  IF NO RELIEF CALL DOCTOR Patient taking differently: Place 0.4 mg under the tongue every 5 (five) minutes as needed for chest pain.  07/01/17  Yes Lorretta Harp, MD  OXYGEN Inhale 2 L into the lungs as needed (for shortness of breath).    Yes [provider]  pantoprazole (PROTONIX) 40 MG tablet Take 1 tablet (40 mg total) by mouth 2 (two) times daily before a meal. Take twice a day before meals for 8 weeks, then once a day Patient taking  differently: Take 40 mg by mouth See admin instructions. Take 40 mg by mouth twice daily, starting 05/01 take 40 mg by mouth once daily 09/29/17  Yes Rai, Ripudeep K, MD  sertraline (ZOLOFT) 100 MG tablet Take 2 tablets (200 mg total) by mouth daily. 05/16/18  Yes Arfeen, Arlyce Harman, MD  trospium (SANCTURA) 20 MG tablet Take 20 mg by mouth at bedtime.    Yes [provider]  warfarin (COUMADIN) 7.5 MG tablet TAKE ONE AND ONE-HALF TO TWO TABLETS BY MOUTH DAILY AS DIRECTED Patient taking differently: Take 7.5-11.25 mg by mouth See admin instructions.  Taking one tablet (7.51m) on Monday, Wed, Fri. All other days taking one & one-half tablet=11.239m1/13/20  Yes BeLorretta HarpMD  amoxicillin-clavulanate (AUGMENTIN) 875-125 MG tablet Take 1 tablet by mouth 2 (two) times daily. Patient not taking: Reported on 08/01/2018 06/26/18   YoBaird Lyons, MD  rosuvastatin (CRESTOR) 10 MG tablet TAKE ONE (1) TABLET BY MOUTH EVERY DAY 08/03/18   MoMinette BrineFNP  SANCTURA XR 60 MG CP24 Take 1 capsule by mouth daily. 10/20/10 03/12/13  [provider]    Inpatient Medications: Scheduled Meds: . ARIPiprazole  5 mg Oral Daily  . aspirin EC  81 mg Oral Daily  . benzonatate  100 mg Oral TID  . cefdinir  300 mg Oral Q12H  . guaiFENesin  600 mg Oral BID  . insulin aspart  0-5 Units Subcutaneous QHS  . insulin aspart  0-9 Units Subcutaneous TID WC  . ipratropium-albuterol  3 mL Nebulization Q6H  . metoprolol tartrate  25 mg Oral BID  . mometasone-formoterol  2 puff Inhalation BID  . pantoprazole  40 mg Oral Daily  . predniSONE  40 mg Oral Q breakfast  . sertraline  200 mg Oral Daily  . warfarin  7.5 mg Oral ONCE-1800  . Warfarin - Pharmacist Dosing Inpatient   Does not apply q1800   Continuous Infusions:  PRN Meds: acetaminophen **OR** acetaminophen, albuterol, gabapentin, menthol-cetylpyridinium, morphine  Allergies:    Allergies  Allergen Reactions  . Meprobamate Nausea And Vomiting     Social History:   Social History   Socioeconomic History  . Marital status: Divorced    Spouse name: Not on file  . Number of children: 2  . Years of education: 1466. Highest education level: Not on file  Occupational History  . 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 Financial resource strain: Not on file  . Food insecurity:    Worry: Not on file    Inability: Not on file  . Transportation needs:    Medical: Not on file    Non-medical: Not on file  Tobacco Use  . Smoking status: Never Smoker  . Smokeless tobacco: Never Used  Substance and Sexual Activity  . Alcohol use: No    Alcohol/week: 0.0 standard drinks  . Drug use: No  . Sexual activity: Not Currently  Lifestyle  . Physical activity:    Days per week: Not on file    Minutes per session: Not on file  . Stress: Not on file  Relationships  . Social connections:    Talks on phone: Not on file    Gets together: Not on file    Attends religious service: Not on file    Active member of club or organization: Not on file    Attends meetings of clubs or organizations: Not on file    Relationship status: Not on file  . Intimate partner violence:    Fear of current or ex partner: Not on file    Emotionally abused: Not on file    Physically abused: Not on file    Forced sexual activity: Not on file  Other Topics Concern  . Not on file  Social History Narrative   Lives at home alone.   Right-handed.   Occasional caffeine use.   Retired [employer]. (Rehab).      Family History:    Family History  Problem Relation Age  of Onset  . Coronary artery disease Mother   . Coronary artery disease Father   . Depression Father      ROS:  Please see the history of present illness.  General:no colds or fevers, no weight changes Skin:no rashes or ulcers HEENT:no blurred vision, no congestion CV:see HPI PUL:see HPI GI:no diarrhea constipation or  melena, no indigestion GU:no hematuria, no dysuria MS:+ joint pain, no claudication Neuro:no syncope, no lightheadedness Endo:+ diabetes, no thyroid disease  All other ROS reviewed and negative.     Physical Exam/Data:   Vitals:   08/04/18 0209 08/04/18 0742 08/04/18 0921 08/04/18 1404  BP: 135/82  131/79   Pulse: 72  93   Resp:   20   Temp:   98.3 F (36.8 C)   TempSrc:   Oral   SpO2: 100% 98% 96% 96%  Weight:      Height:        Intake/Output Summary (Last 24 hours) at 08/04/2018 1627 Last data filed at 08/04/2018 0900 Gross per 24 hour  Intake 360 ml  Output 925 ml  Net -565 ml   Last 3 Weights 08/01/2018 06/28/2018 06/26/2018  Weight (lbs) 278 lb 283 lb 12.8 oz 283 lb  Weight (kg) 126.1 kg 128.731 kg 128.368 kg  Some encounter information is confidential and restricted. Go to Review Flowsheets activity to see all data.     Body mass index is 50.85 kg/m.  General:  Well nourished, well developed, in no acute distress HEENT: normal Lymph: no adenopathy Neck: no JVD Endocrine:  No thryomegaly Vascular: No carotid bruits; pedal pulses 2+ bilaterally  Cardiac:  normal S1, S2; RRR; no murmur gallup rub or click Lungs:  clear to diminished to auscultation bilaterally, no wheezing, rhonchi or rales  Abd: soft, nontender, no hepatomegaly  Ext: no edema Musculoskeletal:  No deformities, BUE and BLE strength normal and equal Skin: warm and dry  Neuro:  CNs 2-12 intact, no focal abnormalities noted Psych:  Normal affect     Relevant CV Studies: nuc in 2008.  Laboratory Data:  Chemistry Recent Labs  Lab 08/02/18 0216 08/03/18 0453 08/04/18 0208  NA 140 139 141  K 4.7 4.4 4.5  CL 102 100 101  CO2 28 34* 32  GLUCOSE 141* 99 101*  BUN 13 17 20   CREATININE 0.86 0.85 0.78  CALCIUM 9.2 9.2 9.2  GFRNONAA >60 >60 >60  GFRAA >60 >60 >60  ANIONGAP 10 5 8     Recent Labs  Lab 08/01/18 0831  PROT 7.8  ALBUMIN 3.7  AST 33  ALT 33  ALKPHOS 76  BILITOT 0.5    Hematology Recent Labs  Lab 08/02/18 0216 08/03/18 0453 08/04/18 0208  WBC 6.3 10.3 8.7  RBC 4.77 4.55 4.66  HGB 9.9* 9.8* 9.9*  HCT 34.8* 32.7* 33.2*  MCV 73.0* 71.9* 71.2*  MCH 20.8* 21.5* 21.2*  MCHC 28.4* 30.0 29.8*  RDW 19.5* 19.4* 19.2*  PLT 249 259 270   Cardiac Enzymes Recent Labs  Lab 08/04/18 1147  TROPONINI <0.03    Recent Labs  Lab 08/01/18 0838  TROPIPOC 0.01    BNPNo results for input(s): BNP, PROBNP in the last 168 hours.  DDimer No results for input(s): DDIMER in the last 168 hours.  Radiology/Studies:  Dg Chest Port 1 View  Result Date: 08/01/2018 CLINICAL DATA:  Shortness of breath and cough EXAM: PORTABLE CHEST 1 VIEW COMPARISON:  09/24/2017 FINDINGS: Normal heart size and mediastinal contours. No acute infiltrate or edema. No  effusion or pneumothorax. No acute osseous findings. IMPRESSION: Negative chest. Electronically Signed   By: Monte Fantasia M.D.   On: 08/01/2018 08:27    Assessment and Plan:   1. Chest pain with abnormal EKG  Neg troponin.  Will add back NTG SL for pain and do lexiscan myoivew tomorrow.   2. Cad with hx of stent to RCA.      For questions or updates, please contact [place] Please consult [email or web address] for contact info under     Signed, Cecilie Kicks, NP  08/04/2018 4:27 PM   Agree with note written by Cecilie Kicks RNP   We were asked to see Ms. Hollifield for evaluation of chest pain.  She is a clinic patient of mine.  She has a history of remote RCA stenting, normal LV function and negative Myoview in 2014.  She has PAF on Coumadin anticoagulation as well as hyperlipidemia, diabetes, obstructive sleep apnea.  She was admitted with bronchitis.  She initially required BiPAP.  She is complained of some pleuritic chest pain since admission.  Her enzymes are negative.  EKG shows nonspecific changes with some mild anterior T wave inversion.  Her exam is benign.  I suspect her pain is more pleuritic and not  ischemically mediated however I think it is reasonable given her EKG changes to perform pharmacologic Myoview stress testing to further evaluate.   Quay Burow 08/04/2018 5:39 PM

## 2018-08-04 NOTE — Progress Notes (Signed)
Physical Therapy Treatment Patient Details Name: Michele [name] MRN: [id number] DOB: 08/14/1950 Today's Date: 08/04/2018    History of Present Illness Michele [name] is a 68 y.o. female with medical history significant of sleep apnea noncompliant to CPAP, paroxysmal A. 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 She has been experiencing ongoing shortness of breath and wheezing for about a month. Patient was admitted 1/14 with COPD exacerbation.     PT Comments    Patient progressing well with therapy today, now performing stair training with min guard. Discussed proper sequencing and pt demonstrating, will need family supervision/guarding for safety at home entry.    Follow Up Recommendations  Home health PT;Supervision/Assistance - 24 hour     Equipment Recommendations  None recommended by PT    Recommendations for Other Services       Precautions / Restrictions Precautions Precautions: Fall Restrictions Weight Bearing Restrictions: No    Mobility  Bed Mobility Overal bed mobility: Modified Independent                Transfers Overall transfer level: Needs assistance Equipment used: Rolling walker (2 wheeled) Transfers: Sit to/from Stand Sit to Stand: Min guard         General transfer comment: min guard for stability   Ambulation/Gait Ambulation/Gait assistance: Min guard Gait Distance (Feet): 50 Feet Assistive device: Rolling walker (2 wheeled) Gait Pattern/deviations: Step-to pattern;Step-through pattern Gait velocity: decreased       Stairs Stairs: Yes Stairs assistance: Min guard;Supervision Stair Management: One rail Right;One rail Left       Wheelchair Mobility    Modified Rankin (Stroke Patients Only)       Balance Overall balance assessment: Mild  deficits observed, not formally tested                                          Cognition Arousal/Alertness: Awake/alert Behavior During Therapy: WFL for tasks assessed/performed Overall Cognitive Status: Within Functional Limits for tasks assessed                                        Exercises      General Comments        Pertinent Vitals/Pain Pain Assessment: No/denies pain    Home Living                      Prior Function            PT Goals (current goals can now be found in the care plan section) Acute Rehab PT Goals Patient Stated Goal: to go home and get stronger PT Goal Formulation: With patient Potential to Achieve Goals: Good Progress towards PT goals: Progressing toward goals    Frequency    Min 3X/week      PT Plan Current plan remains appropriate    Co-evaluation              AM-PAC PT "6 Clicks" Mobility   Outcome Measure  Help needed turning from your back to your side while in a flat bed without using bedrails?: None Help needed moving from lying on your back to sitting on  the side of a flat bed without using bedrails?: None Help needed moving to and from a bed to a chair (including a wheelchair)?: A Little Help needed standing up from a chair using your arms (e.g., wheelchair or bedside chair)?: A Little Help needed to walk in hospital room?: A Little Help needed climbing 3-5 steps with a railing? : A Lot 6 Click Score: 19    End of Session Equipment Utilized During Treatment: Gait belt Activity Tolerance: Patient tolerated treatment well Patient left: in chair;with chair alarm set;with call bell/phone within reach;with nursing/sitter in room Nurse Communication: Mobility status PT Visit Diagnosis: Unsteadiness on feet (R26.81);Difficulty in walking, not elsewhere classified (R26.2);Other abnormalities of gait and mobility (R26.89)     Time: 0569-7948 PT Time Calculation (min) (ACUTE  ONLY): 25 min  Charges:  $Gait Training: 23-37 mins                    Reinaldo Berber, PT, DPT Acute Rehabilitation Services Pager: [phone] Office: [phone]     Reinaldo Berber 08/04/2018, 10:56 AM

## 2018-08-04 NOTE — Progress Notes (Addendum)
ANTICOAGULATION CONSULT NOTE - Follow-Up Consult  Pharmacy Consult for Warfarin Indication: atrial fibrillation  Allergies  Allergen Reactions  . Meprobamate Nausea And Vomiting    Patient Measurements: Height: 5\' 2"  (157.5 cm) Weight: 278 lb (126.1 kg) IBW/kg (Calculated) : 50.1 Vital Signs: Temp: 98.3 F (36.8 C) (01/17 0921) Temp Source: Oral (01/17 0921) BP: 131/79 (01/17 0921) Pulse Rate: 93 (01/17 0921)  Labs: Recent Labs    08/02/18 0216 08/03/18 0453 08/04/18 0208  HGB 9.9* 9.8* 9.9*  HCT 34.8* 32.7* 33.2*  PLT 249 259 270  LABPROT 21.0* 25.0* 23.3*  INR 1.84 2.31 2.10  CREATININE 0.86 0.85 0.78    Estimated Creatinine Clearance: 86.7 mL/min (by C-G formula based on SCr of 0.78 mg/dL).   Medical History: Past Medical History:  Diagnosis Date  . Allergic rhinitis   . ARF (acute renal failure) ([place]) 03/2017  . 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 Asthma   . CAD (coronary artery disease) 01/30/2007   stents trivial coronary artery disease diffusely, with a recent deployment od a intracoronary artery stent, 3.5 x 12 mm driver stent with no more than 20-30% in- stents restenosis.done by Dr [name] with re-look on novenber 10 2008 revealing a widely patent stent with otherwise trival CAD and normal LV function  . Carpal tunnel syndrome, right   . Chest pain 07/17/12009   2 D Echo EF >55%  . Chronic lower back pain   . Complication of anesthesia    slow to wake up  . COPD (chronic obstructive pulmonary disease) ([place])   . DDD (degenerative disc disease), lumbar    multilevel lumbar degenerative disc disease  . Depression   . Diabetes mellitus type II   . Gait instability   . GERD (gastroesophageal reflux disease)   . History of kidney stones   . History of stomach ulcers    "years ago" (08/01/2018)  . Hyperlipidemia   . Hypertension   . IBS (irritable bowel syndrome)   . Iron deficiency anemia 07/13/2016  . Obesity   . On home  oxygen therapy    "2L 24/7" (08/01/2018)  . OSA on CPAP    "w/O2" (08/01/2018)  . Paroxysmal A-fib ([place])   . PTSD (post-traumatic stress disorder)   . Stroke [facility])    "not sure when; showed up on one of my tests" (08/01/2018)  . TIA (transient ischemic attack)    "several; they say I have small ones" (08/01/2018)  . Urinary incontinence     Assessment: 68 yo female admitted with a COPD exacerbation. Pharmacy consulted to restart Coumadin for atrial fibrillation.   The patient was seen in the anti-coagulation clinic on 1/13 and was found to have a SUBtherapeutic INR of 1.5 - the patient was instructed to take 15 mg on 1/13 - then resume PTA dosing of 11.25 mg daily EXCEPT for 7.5 mg on MWF.  INR today is therapeutic at 2.10 after warfarin doses of 15mg , 10mg , 7.5mg  for past 3 days since admission.  +DDI potential likely w/ Azithromycin 1/16>> (1/20) Hgb low/stable 9.9. MD noted that anemia appears chronic, etiololgy unclear, plans to get further work up. No acute bleeding noted. PLTC wnl.  Goal of Therapy:  INR 2-3   Plan:  - Warfarin 7.5 mg x 1 dose at 1800 today - Will continue to monitor for any signs/symptoms of bleeding and will follow up with PT/INR in the a.m.  Thank you for allowing pharmacy to be a part  of this patient's care.  Nicole Cella, RPh Clinical Pharmacist Please see AMION for all Pharmacists' Contact Phone Numbers 08/04/2018, 10:21 AM

## 2018-08-04 NOTE — Progress Notes (Addendum)
[facility] Progress Note  Patient: Michele [name] [facility] FFM:[id number]   PCP: [name], MD DOB: 12/22/1950   DOA: 08/01/2018   DOS: 08/04/2018   Date of Service: the patient was seen and examined on 08/04/2018  Brief hospital course: Pt. with PMH of sleep apnea noncompliant to CPAP, paroxysmal A. 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 Currently further plan is continue current care.  Subjective: Reports having chest pain.  No nausea no vomiting.  No fever no chills.  Also had shortness of breath.  Chest pain is reported as started at 3 AM in the morning continuous with intermittent worsening and pressure-like sensation.  No stabbing no burning.  Telemetry: Sinus tachycardia with PAC.  Assessment and Plan: Acute on chronic respiratory failure with hypoxia due to COPD exacerbation: Continue BiPAP as needed for respiratory distress.  Keep on oxygen to keep saturations more than 90%. Given severity of symptoms, though chest x-ray does not show any evidence of pneumonia, will treat with antibiotics.  Switch to oral Aggressive bronchodilator therapy with scheduled and as needed nebulizers, inhalational steroids, oral steroids.  Obstructive sleep apnea, noncompliant to CPAP.  I discussed with patient and family to be compliant that may help with reducing exacerbations and shortness of breath.  Type 2 diabetes: Uncontrolled with hyperglycemia. Secondary to steroids. On metformin at home.  Suspect exacerbation with use of steroids.  Will use insulin coverage.  Paroxysmal A. fib: Currently sinus rhythm.  Rate controlled.  Resume metoprolol.  Subtherapeutic on Coumadin.   Pharmacy to dose.  Hypertension: Stable.  Resume home medications.  Anemia appears chronic. Initial acute drop appears to be dilutional in nature. Repeat H&H for last 3 days have remained stable. No acute bleeding here in the hospital.  Iron studies are also unremarkable.  CAD Chest pain. Patient does have significant coronary disease. Her pain while atypical in nature is not reproducible. EKG shows some T wave inversions in anterolateral leads as well as subtle changes in the inferior leads. New symptoms were present on admission as well but not present on prior EKG. Troponin is negative x1. We will add aspirin. Cardiology consult for further assistance. Monitor on telemetry.  Morbid obesity. Body mass index is 50.85 kg/m.  Dietary consultation.  Diet: cardiac diet DVT Prophylaxis: subcutaneous Heparin  Advance goals of care discussion: full code  Family Communication: family was present at bedside, at the time of interview.   Disposition:  Discharge to home.  Consultants: none Procedures: none  Scheduled Meds: . ARIPiprazole  5 mg Oral Daily  . aspirin EC  81 mg Oral Daily  . benzonatate  100 mg Oral TID  . cefdinir  300 mg Oral Q12H  . guaiFENesin  600 mg Oral BID  . insulin aspart  0-5 Units Subcutaneous QHS  . insulin aspart  0-9 Units Subcutaneous TID WC  . ipratropium-albuterol  3 mL Nebulization Q6H  . metoprolol tartrate  25 mg Oral BID  . mometasone-formoterol  2 puff Inhalation BID  . pantoprazole  40 mg Oral Daily  . predniSONE  40 mg Oral Q breakfast  . sertraline  200 mg Oral Daily  . warfarin  7.5 mg Oral ONCE-1800  . Warfarin - Pharmacist Dosing Inpatient  Does not apply q1800   Continuous Infusions:  PRN Meds: acetaminophen **OR** acetaminophen, albuterol, gabapentin, menthol-cetylpyridinium, morphine Antibiotics: Anti-infectives (From admission, onward)   Start     Dose/Rate Route Frequency Ordered Stop   08/03/18 1000  cefdinir  (OMNICEF) capsule 300 mg     300 mg Oral Every 12 hours 08/03/18 0752 08/06/18 0959   08/03/18 1000  azithromycin (ZITHROMAX) tablet 500 mg  Status:  Discontinued     500 mg Oral Daily 08/03/18 0752 08/04/18 1324   08/01/18 1700  cefTRIAXone (ROCEPHIN) 1 g in sodium chloride 0.9 % 100 mL IVPB  Status:  Discontinued     1 g 200 mL/hr over 30 Minutes Intravenous Every 24 hours 08/01/18 1601 08/03/18 0752       Objective: Physical Exam: Vitals:   08/04/18 0209 08/04/18 0742 08/04/18 0921 08/04/18 1404  BP: 135/82  131/79   Pulse: 72  93   Resp:   20   Temp:   98.3 F (36.8 C)   TempSrc:   Oral   SpO2: 100% 98% 96% 96%  Weight:      Height:        Intake/Output Summary (Last 24 hours) at 08/04/2018 1654 Last data filed at 08/04/2018 0900 Gross per 24 hour  Intake 360 ml  Output 925 ml  Net -565 ml   Filed Weights   08/01/18 1900  Weight: 126.1 kg   General: Alert, Awake and Oriented to Time, Place and Person. Appear in moderate distress, affect appropriate Eyes: PERRL, Conjunctiva normal ENT: Oral Mucosa clear moist. Neck: no JVD, no Abnormal Mass Or lumps Cardiovascular: S1 and S2 Present, no Murmur, Peripheral Pulses Present Respiratory: increased respiratory effort, Bilateral Air entry equal and Decreased, no use of accessory muscle, bilateral  Crackles, bilateral  wheezes Abdomen: Bowel Sound present, Soft and no tenderness, no hernia Skin: no redness, no Rash, non induration Extremities: no Pedal edema, no calf tenderness Neurologic: Grossly no focal neuro deficit. Bilaterally Equal motor strength  Data Reviewed: CBC: Recent Labs  Lab 08/01/18 0831 08/02/18 0216 08/03/18 0453 08/04/18 0208  WBC 10.6* 6.3 10.3 8.7  NEUTROABS 5.3  --   --   --   HGB 11.5* 9.9* 9.8* 9.9*  HCT 39.4 34.8* 32.7* 33.2*  MCV 73.0* 73.0* 71.9* 71.2*  PLT 259 249 259 211   Basic Metabolic Panel: Recent Labs  Lab 08/01/18 0831 08/02/18 0216 08/03/18 0453 08/04/18 0208  NA 140  140 139 141  K 4.0 4.7 4.4 4.5  CL 104 102 100 101  CO2 25 28 34* 32  GLUCOSE 129* 141* 99 101*  BUN 10 13 17 20   CREATININE 0.74 0.86 0.85 0.78  CALCIUM 9.1 9.2 9.2 9.2  MG  --   --  2.2  --     Liver Function Tests: Recent Labs  Lab 08/01/18 0831  AST 33  ALT 33  ALKPHOS 76  BILITOT 0.5  PROT 7.8  ALBUMIN 3.7   No results for input(s): LIPASE, AMYLASE in the last 168 hours. No results for input(s): AMMONIA in the last 168 hours. Coagulation Profile: Recent Labs  Lab 07/31/18 1127 08/02/18 0216 08/03/18 0453 08/04/18 0208  INR 1.5* 1.84 2.31 2.10   Cardiac Enzymes: Recent Labs  Lab 08/04/18 1147  TROPONINI <0.03   BNP (last 3 results) No results for input(s): PROBNP in the last 8760 hours. CBG: Recent Labs  Lab 08/03/18 1709 08/03/18 2122 08/04/18 0751 08/04/18 1208 08/04/18 1614  GLUCAP 118* 161* 87  112* 151*   Studies: No results found.   Time spent: 35 minutes  Author: Berle Mull, MD [facility] 08/04/2018 4:54 PM  Between 7PM-7AM, please contact night-coverage at [email or web address]

## 2018-08-05 ENCOUNTER — Inpatient Hospital Stay (HOSPITAL_COMMUNITY): Payer: Medicare Other

## 2018-08-05 DIAGNOSIS — R079 Chest pain, unspecified: Secondary | ICD-10-CM

## 2018-08-05 LAB — BASIC METABOLIC PANEL
Anion gap: 13 (ref 5–15)
BUN: 16 mg/dL (ref 8–23)
CO2: 30 mmol/L (ref 22–32)
Calcium: 9.6 mg/dL (ref 8.9–10.3)
Chloride: 96 mmol/L — ABNORMAL LOW (ref 98–111)
Creatinine, Ser: 0.68 mg/dL (ref 0.44–1.00)
GFR calc Af Amer: >60 mL/min (ref >60)
GFR calc non Af Amer: >60 mL/min (ref >60)
Glucose, Bld: 127 mg/dL — ABNORMAL HIGH (ref 70–99)
Potassium: 3.9 mmol/L (ref 3.5–5.1)
Sodium: 139 mmol/L (ref 135–145)

## 2018-08-05 LAB — PROTIME-INR
INR: 1.88
Prothrombin Time: 21.3 seconds — ABNORMAL HIGH (ref 11.4–15.2)

## 2018-08-05 LAB — CBC
HCT: 36.7 % (ref 36.0–46.0)
Hemoglobin: 10.8 g/dL — ABNORMAL LOW (ref 12.0–15.0)
MCH: 20.5 pg — ABNORMAL LOW (ref 26.0–34.0)
MCHC: 29.4 g/dL — ABNORMAL LOW (ref 30.0–36.0)
MCV: 69.8 fL — ABNORMAL LOW (ref 80.0–100.0)
Platelets: 297 10*3/uL (ref 150–400)
RBC: 5.26 MIL/uL — ABNORMAL HIGH (ref 3.87–5.11)
RDW: 18.8 % — ABNORMAL HIGH (ref 11.5–15.5)
WBC: 9.8 10*3/uL (ref 4.0–10.5)
nRBC: 0 % (ref 0.0–0.2)

## 2018-08-05 LAB — GLUCOSE, CAPILLARY
GLUCOSE-CAPILLARY: 123 mg/dL — AB (ref 70–99)
GLUCOSE-CAPILLARY: 105 mg/dL — AB (ref 70–99)
Glucose-Capillary: 111 mg/dL — ABNORMAL HIGH (ref 70–99)
Glucose-Capillary: 108 mg/dL — ABNORMAL HIGH (ref 70–99)

## 2018-08-05 LAB — TROPONIN I: Troponin I: <0.03 ng/mL (ref <0.03)

## 2018-08-05 MED ORDER — TECHNETIUM TC 99M TETROFOSMIN IV KIT
30.0000 | PACK | Freq: Once | INTRAVENOUS | Status: AC | PRN
  Administered 2018-08-05: 30 via INTRAVENOUS

## 2018-08-05 MED ORDER — IPRATROPIUM-ALBUTEROL 0.5-2.5 (3) MG/3ML IN SOLN
3.0000 mL | Freq: Three times a day (TID) | RESPIRATORY_TRACT | Status: DC
  Administered 2018-08-06 – 2018-08-09 (×11): 3 mL via RESPIRATORY_TRACT
  Filled 2018-08-05 (×11): qty 3

## 2018-08-05 MED ORDER — AMLODIPINE BESYLATE 5 MG PO TABS
5.0000 mg | ORAL_TABLET | Freq: Two times a day (BID) | ORAL | Status: DC
  Administered 2018-08-05 – 2018-08-10 (×10): 5 mg via ORAL
  Filled 2018-08-05 (×10): qty 1

## 2018-08-05 MED ORDER — REGADENOSON 0.4 MG/5ML IV SOLN
INTRAVENOUS | Status: AC
  Administered 2018-08-05: 0.4 mg
  Filled 2018-08-05: qty 5

## 2018-08-05 MED ORDER — METOPROLOL TARTRATE 25 MG PO TABS
50.0000 mg | ORAL_TABLET | Freq: Two times a day (BID) | ORAL | Status: DC
  Administered 2018-08-05 – 2018-08-10 (×11): 50 mg via ORAL
  Filled 2018-08-05 (×6): qty 1
  Filled 2018-08-05: qty 2
  Filled 2018-08-05: qty 1
  Filled 2018-08-05: qty 2
  Filled 2018-08-05 (×2): qty 1

## 2018-08-05 MED ORDER — AMLODIPINE BESYLATE 5 MG PO TABS
5.0000 mg | ORAL_TABLET | Freq: Every day | ORAL | Status: DC
  Filled 2018-08-05: qty 1

## 2018-08-05 MED ORDER — WARFARIN SODIUM 10 MG PO TABS
10.0000 mg | ORAL_TABLET | Freq: Once | ORAL | Status: AC
  Administered 2018-08-05: 10 mg via ORAL
  Filled 2018-08-05: qty 1

## 2018-08-05 MED ORDER — PREDNISONE 20 MG PO TABS
30.0000 mg | ORAL_TABLET | Freq: Every day | ORAL | Status: AC
  Administered 2018-08-06: 30 mg via ORAL
  Filled 2018-08-05: qty 1

## 2018-08-05 MED ORDER — REGADENOSON 0.4 MG/5ML IV SOLN
0.4000 mg | Freq: Once | INTRAVENOUS | Status: AC
  Administered 2018-08-05: 0.4 mg via INTRAVENOUS
  Filled 2018-08-05: qty 5

## 2018-08-05 NOTE — Progress Notes (Signed)
2 day lexiscan myoview based on weight. Stress portion today, resting portion tomorrow. Stress portion completed without complication. Pending final report tomorrow afternoon by [facility] reader.  Hilbert Corrigan PA Pager: [phone]

## 2018-08-05 NOTE — Progress Notes (Signed)
Stress test complete. Patient denies any complaints. Offered patient snack and beverage, patient denies wanting anything. Patient taken back to nuclear medicine.

## 2018-08-05 NOTE — Progress Notes (Addendum)
Progress Note  Patient Name: Michele [name] [facility] Date of Encounter: 08/05/2018  Primary Cardiologist: Quay Burow, MD   Subjective   Breathing fair   Mild wheeze   No CP    Inpatient Medications    Scheduled Meds: . amLODipine  5 mg Oral Daily  . ARIPiprazole  5 mg Oral Daily  . aspirin EC  81 mg Oral Daily  . benzonatate  100 mg Oral TID  . cefdinir  300 mg Oral Q12H  . guaiFENesin  600 mg Oral BID  . insulin aspart  0-5 Units Subcutaneous QHS  . insulin aspart  0-9 Units Subcutaneous TID WC  . ipratropium-albuterol  3 mL Nebulization Q6H  . metoprolol tartrate  50 mg Oral BID  . mometasone-formoterol  2 puff Inhalation BID  . pantoprazole  40 mg Oral Daily  . [START ON 08/06/2018] predniSONE  30 mg Oral Q breakfast  . sertraline  200 mg Oral Daily  . warfarin  10 mg Oral ONCE-1800  . Warfarin - Pharmacist Dosing Inpatient   Does not apply q1800   Continuous Infusions:  PRN Meds: acetaminophen **OR** acetaminophen, albuterol, gabapentin, hydrALAZINE, menthol-cetylpyridinium, morphine, morphine injection, ondansetron (ZOFRAN) IV   Vital Signs    Vitals:   08/05/18 1004 08/05/18 1027 08/05/18 1028 08/05/18 1030  BP: (!) 162/103 (!) 162/95 (!) 158/97 (!) 168/97  Pulse:      Resp:      Temp:      TempSrc:      SpO2:      Weight:      Height:        Intake/Output Summary (Last 24 hours) at 08/05/2018 1106 Last data filed at 08/04/2018 1700 Gross per 24 hour  Intake 480 ml  Output -  Net 480 ml   Last 3 Weights 08/01/2018 06/28/2018 06/26/2018  Weight (lbs) 278 lb 283 lb 12.8 oz 283 lb  Weight (kg) 126.1 kg 128.731 kg 128.368 kg  Some encounter information is confidential and restricted. Go to Review Flowsheets activity to see all data.      Telemetry    NSR - Personally Reviewed  ECG      Physical Exam   GEN: Morbidly obese 68 yo acute distress.   Neck: Neck is full   Cardiac: RRR, no murmurs, rubs, or gallops.  Respiratory: Mild  upper airway wheeze   GI: Soft, nontender, non-distended  MS: Triv edema; No deformity. Neuro:  Nonfocal  Psych: Normal affect   Labs    Chemistry Recent Labs  Lab 08/01/18 0831  08/03/18 0453 08/04/18 0208 08/05/18 0308  NA 140   < > 139 141 139  K 4.0   < > 4.4 4.5 3.9  CL 104   < > 100 101 96*  CO2 25   < > 34* 32 30  GLUCOSE 129*   < > 99 101* 127*  BUN 10   < > 17 20 16   CREATININE 0.74   < > 0.85 0.78 0.68  CALCIUM 9.1   < > 9.2 9.2 9.6  PROT 7.8  --   --   --   --   ALBUMIN 3.7  --   --   --   --   AST 33  --   --   --   --   ALT 33  --   --   --   --   ALKPHOS 76  --   --   --   --  BILITOT 0.5  --   --   --   --   GFRNONAA >60   < > >60 >60 >60  GFRAA >60   < > >60 >60 >60  ANIONGAP 11   < > 5 8 13    < > = values in this interval not displayed.     Hematology Recent Labs  Lab 08/03/18 0453 08/04/18 0208 08/05/18 0308  WBC 10.3 8.7 9.8  RBC 4.55 4.66 5.26*  HGB 9.8* 9.9* 10.8*  HCT 32.7* 33.2* 36.7  MCV 71.9* 71.2* 69.8*  MCH 21.5* 21.2* 20.5*  MCHC 30.0 29.8* 29.4*  RDW 19.4* 19.2* 18.8*  PLT 259 270 297    Cardiac Enzymes Recent Labs  Lab 08/04/18 1147 08/04/18 1956 08/05/18 0014  TROPONINI <0.03 <0.03 <0.03    Recent Labs  Lab 08/01/18 0838  TROPIPOC 0.01     BNPNo results for input(s): BNP, PROBNP in the last 168 hours.   DDimer No results for input(s): DDIMER in the last 168 hours.   Radiology    No results found.  Cardiac Studies   Echo 09/25/2017 LV EF: 65% -   70%  ------------------------------------------------------------------- Indications:      Chest pain 786.51.  ------------------------------------------------------------------- History:   PMH:   Atrial fibrillation.  Coronary artery disease. PMH:  Sleep Apnea. Acute Renal Failure.  Risk factors: Hypertension. Diabetes mellitus. Dyslipidemia.  ------------------------------------------------------------------- Study Conclusions  - Left ventricle: The  cavity size was normal. Wall thickness was   increased in a pattern of mild LVH. Systolic function was   vigorous. The estimated ejection fraction was in the range of 65%   to 70%. Wall motion was normal; there were no regional wall   motion abnormalities. Doppler parameters are consistent with   abnormal left ventricular relaxation (grade 1 diastolic   dysfunction). - Aortic valve: Poorly visualized. Probably trileaflet. There was   no stenosis. - Mitral valve: There was no significant regurgitation. - Right ventricle: The cavity size was normal. Systolic function   was normal. - Pulmonary arteries: No complete TR doppler jet so unable to   estimate PA systolic pressure. - Inferior vena cava: The vessel was normal in size. The   respirophasic diameter changes were in the normal range (>= 50%),   consistent with normal central venous pressure. - Pericardium, extracardiac: A trivial pericardial effusion was   identified.  Impressions:  - Normal LV size with mild LV hypertrophy. EF 65-70%, vigorous   systolic function. Normal RV size and systolic function. No   significant valvular abnormalities.  Patient Profile     68 y.o. female with a hx of CAD with stent to pRCA 2008, DM-2, HTN, sleep apnea, asthma, PAF, HLD and arthritis who was admitted for wheezing and SOB, she also complained of chest pain  Assessment & Plan    1. Chest pain: Yesterday's EKG showed anterior TWI, however repeat EKG showed resolution. Given her body habitus, she requires 2 days stress test. Stress portion completed today, resting portion will be done tomorrow before being read by [facility] reader.   2. CAD s/p stent to pRCA 2008: intermittent chest pain at rest. See #1. Continue ASA.  Myovue pending tobacco  3. HTN: blood pressure is elevated today   Resume meds and follow   Will increase amlodipine to bid    4. HLD:  Would restart home Crestor    5. DM II: defer to PCP  6. PAF: on coumadin  7. Morbid  obesity.   8.  COPD exacerbation: will defer to hospitalist service.   Pt with some upper airway wheeze   Notes some reflux (bitter taste in mouth at times)  This may be exacerbating pulmonary as well         For questions or updates, please contact [place] Please consult [email or web address] for contact info under        Signed, Almyra Deforest, [place]  08/05/2018, 11:06 AM     PT seen and examined   I have amended note above by Janan Ridge to reflect my findings including physical exam as noted above   Pt witout pain today   Myovue is a 2 day study   Will image tomorrow Wheezing   Some upper airway  ? reflux exacerbating   Continue protonix.  Dorris Carnes

## 2018-08-05 NOTE — Progress Notes (Signed)
[facility] Progress Note  Patient: Michele [name] [facility] YPP:[id number]   PCP: [name], MD DOB: 1951-04-21   DOA: 08/01/2018   DOS: 08/05/2018   Date of Service: the patient was seen and examined on 08/05/2018  Brief hospital course: Pt. with PMH of sleep apnea noncompliant to CPAP, paroxysmal A. 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 Currently further plan is continue current care.  Subjective: Seen after the stress test.  No further chest pain.  Still has shortness of breath.  Telemetry: Sinus tachycardia with PAC.  Assessment and Plan: Acute on chronic respiratory failure with hypoxia due to COPD exacerbation: Initially needed BiPAP Now on oxygen, home regimen Continue with antibiotics. Continue bronchodilator therapy with scheduled and as needed nebulizers, inhalational steroids, oral steroids.  Obstructive sleep apnea, noncompliant to CPAP.  I discussed with patient and family to be compliant that may help with reducing exacerbations and shortness of breath. Continue CPAP at night  Type 2 diabetes: Uncontrolled with hyperglycemia. Secondary to steroids. On metformin at home.  Suspect exacerbation with use of steroids.  Will use insulin coverage.  Paroxysmal A. fib: Currently sinus rhythm.  Rate controlled.  Resume metoprolol.  Subtherapeutic on Coumadin.  Pharmacy to dose.  Hypertension: Stable.  Adjusting home medication due to uncontrolled hypertension  Anemia appears chronic. Initial acute drop appears to be dilutional in nature. Repeat H&H for last 3 days have remained stable. No acute bleeding here in the hospital.  Iron studies are also  unremarkable.  CAD Chest pain. Patient does have significant coronary disease. Her pain while atypical in nature is not reproducible. EKG shows some T wave inversions in anterolateral leads as well as subtle changes in the inferior leads. New symptoms were present on admission as well but not present on prior EKG. Troponin is negative We will add aspirin. Appreciate cardiology assistance. 2-day stress test secondary to BMI.  Morbid obesity. Body mass index is 50.85 kg/m.  Dietary consultation.  Diet: cardiac diet DVT Prophylaxis: subcutaneous Heparin  Advance goals of care discussion: full code  Family Communication: family was present at bedside, at the time of interview.   Disposition:  Discharge to home.  Consultants: Cardiology Procedures: Nuclear medicine stress test  Scheduled Meds: . amLODipine  5 mg Oral BID  . ARIPiprazole  5 mg Oral Daily  . aspirin EC  81 mg Oral Daily  . benzonatate  100 mg Oral TID  . cefdinir  300 mg Oral Q12H  . guaiFENesin  600 mg Oral BID  . insulin aspart  0-5 Units Subcutaneous QHS  . insulin aspart  0-9 Units Subcutaneous TID WC  . ipratropium-albuterol  3 mL Nebulization Q6H  . metoprolol tartrate  50 mg Oral BID  . mometasone-formoterol  2 puff Inhalation BID  . pantoprazole  40 mg Oral Daily  . [START ON 08/06/2018] predniSONE  30 mg Oral Q breakfast  . sertraline  200 mg Oral Daily  . warfarin  10 mg Oral ONCE-1800  . Warfarin - Pharmacist Dosing Inpatient   Does not apply q1800   Continuous Infusions:  PRN Meds: acetaminophen **OR** acetaminophen, albuterol, gabapentin, hydrALAZINE, menthol-cetylpyridinium, morphine, morphine injection, ondansetron (ZOFRAN) IV Antibiotics: Anti-infectives (From admission, onward)   Start  Dose/Rate Route Frequency Ordered Stop   08/03/18 1000  cefdinir (OMNICEF) capsule 300 mg     300 mg Oral Every 12 hours 08/03/18 0752 08/06/18 0959   08/03/18 1000  azithromycin (ZITHROMAX) tablet  500 mg  Status:  Discontinued     500 mg Oral Daily 08/03/18 0752 08/04/18 1324   08/01/18 1700  cefTRIAXone (ROCEPHIN) 1 g in sodium chloride 0.9 % 100 mL IVPB  Status:  Discontinued     1 g 200 mL/hr over 30 Minutes Intravenous Every 24 hours 08/01/18 1601 08/03/18 0752       Objective: Physical Exam: Vitals:   08/05/18 1028 08/05/18 1030 08/05/18 1228 08/05/18 1428  BP: (!) 158/97 (!) 168/97    Pulse:   76   Resp:      Temp:      TempSrc:      SpO2:    97%  Weight:      Height:        Intake/Output Summary (Last 24 hours) at 08/05/2018 1449 Last data filed at 08/04/2018 1700 Gross per 24 hour  Intake 240 ml  Output -  Net 240 ml   Filed Weights   08/01/18 1900  Weight: 126.1 kg   General: Alert, Awake and Oriented to Time, Place and Person. Appear in moderate distress, affect appropriate Eyes: PERRL, Conjunctiva normal ENT: Oral Mucosa clear moist. Neck: no JVD, no Abnormal Mass Or lumps Cardiovascular: S1 and S2 Present, no Murmur, Peripheral Pulses Present Respiratory: increased respiratory effort, Bilateral Air entry equal and Decreased, no use of accessory muscle, bilateral  Crackles, bilateral  wheezes Abdomen: Bowel Sound present, Soft and no tenderness, no hernia Skin: no redness, no Rash, non induration Extremities: no Pedal edema, no calf tenderness Neurologic: Grossly no focal neuro deficit. Bilaterally Equal motor strength  Data Reviewed: CBC: Recent Labs  Lab 08/01/18 0831 08/02/18 0216 08/03/18 0453 08/04/18 0208 08/05/18 0308  WBC 10.6* 6.3 10.3 8.7 9.8  NEUTROABS 5.3  --   --   --   --   HGB 11.5* 9.9* 9.8* 9.9* 10.8*  HCT 39.4 34.8* 32.7* 33.2* 36.7  MCV 73.0* 73.0* 71.9* 71.2* 69.8*  PLT 259 249 259 270 440   Basic Metabolic Panel: Recent Labs  Lab 08/01/18 0831 08/02/18 0216 08/03/18 0453 08/04/18 0208 08/05/18 0308  NA 140 140 139 141 139  K 4.0 4.7 4.4 4.5 3.9  CL 104 102 100 101 96*  CO2 25 28 34* 32 30  GLUCOSE 129* 141*  99 101* 127*  BUN 10 13 17 20 16   CREATININE 0.74 0.86 0.85 0.78 0.68  CALCIUM 9.1 9.2 9.2 9.2 9.6  MG  --   --  2.2  --   --     Liver Function Tests: Recent Labs  Lab 08/01/18 0831  AST 33  ALT 33  ALKPHOS 76  BILITOT 0.5  PROT 7.8  ALBUMIN 3.7   No results for input(s): LIPASE, AMYLASE in the last 168 hours. No results for input(s): AMMONIA in the last 168 hours. Coagulation Profile: Recent Labs  Lab 07/31/18 1127 08/02/18 0216 08/03/18 0453 08/04/18 0208 08/05/18 0308  INR 1.5* 1.84 2.31 2.10 1.88   Cardiac Enzymes: Recent Labs  Lab 08/04/18 1147 08/04/18 1956 08/05/18 0014  TROPONINI <0.03 <0.03 <0.03   BNP (last 3 results) No results for input(s): PROBNP in the last 8760 hours. CBG: Recent Labs  Lab 08/04/18 1208 08/04/18 1614 08/04/18 2120 08/05/18 0739 08/05/18 1155  GLUCAP 112* 151* 119*  123* 105*   Studies: No results found.   Time spent: 35 minutes  Author: Berle Mull, MD [facility] 08/05/2018 2:49 PM  Between 7PM-7AM, please contact night-coverage at [email or web address]

## 2018-08-05 NOTE — Progress Notes (Signed)
Rt note: patient having frequent vomiting throughout the evening cpap placed at bedside for now no distress and mainating saturations will cotinue to monitor

## 2018-08-06 ENCOUNTER — Inpatient Hospital Stay (HOSPITAL_COMMUNITY): Payer: Medicare Other

## 2018-08-06 LAB — BASIC METABOLIC PANEL
Anion gap: 10 (ref 5–15)
BUN: 11 mg/dL (ref 8–23)
CALCIUM: 9.5 mg/dL (ref 8.9–10.3)
CO2: 32 mmol/L (ref 22–32)
Chloride: 96 mmol/L — ABNORMAL LOW (ref 98–111)
Creatinine, Ser: 0.67 mg/dL (ref 0.44–1.00)
GFR calc Af Amer: >60 mL/min (ref >60)
GFR calc non Af Amer: >60 mL/min (ref >60)
Glucose, Bld: 92 mg/dL (ref 70–99)
Potassium: 3.5 mmol/L (ref 3.5–5.1)
SODIUM: 138 mmol/L (ref 135–145)

## 2018-08-06 LAB — CBC
HCT: 36.7 % (ref 36.0–46.0)
Hemoglobin: 11.1 g/dL — ABNORMAL LOW (ref 12.0–15.0)
MCH: 21.2 pg — ABNORMAL LOW (ref 26.0–34.0)
MCHC: 30.2 g/dL (ref 30.0–36.0)
MCV: 70 fL — ABNORMAL LOW (ref 80.0–100.0)
Platelets: 345 10*3/uL (ref 150–400)
RBC: 5.24 MIL/uL — ABNORMAL HIGH (ref 3.87–5.11)
RDW: 19 % — ABNORMAL HIGH (ref 11.5–15.5)
WBC: 7.9 10*3/uL (ref 4.0–10.5)
nRBC: 0 % (ref 0.0–0.2)

## 2018-08-06 LAB — NM MYOCAR MULTI W/SPECT W/WALL MOTION / EF
CSEPEW: 1 METS
CSEPPHR: 100 {beats}/min
MPHR: 153 {beats}/min
Percent HR: 65 %
Rest HR: 85 {beats}/min

## 2018-08-06 LAB — PROTIME-INR
INR: 1.7
PROTHROMBIN TIME: 19.7 s — AB (ref 11.4–15.2)

## 2018-08-06 LAB — GLUCOSE, CAPILLARY
Glucose-Capillary: 101 mg/dL — ABNORMAL HIGH (ref 70–99)
Glucose-Capillary: 90 mg/dL (ref 70–99)
Glucose-Capillary: 106 mg/dL — ABNORMAL HIGH (ref 70–99)
Glucose-Capillary: 145 mg/dL — ABNORMAL HIGH (ref 70–99)

## 2018-08-06 MED ORDER — TECHNETIUM TC 99M TETROFOSMIN IV KIT
30.0000 | PACK | Freq: Once | INTRAVENOUS | Status: AC | PRN
  Administered 2018-08-06: 30 via INTRAVENOUS

## 2018-08-06 MED ORDER — WARFARIN SODIUM 7.5 MG PO TABS
15.0000 mg | ORAL_TABLET | Freq: Once | ORAL | Status: AC
  Administered 2018-08-06: 15 mg via ORAL
  Filled 2018-08-06: qty 2

## 2018-08-06 NOTE — Progress Notes (Signed)
[facility] Progress Note  Patient: Michele [name] [facility] KZS:[id number]   PCP: [name], MD DOB: 1951/06/09   DOA: 08/01/2018   DOS: 08/06/2018   Date of Service: the patient was seen and examined on 08/06/2018  Brief hospital course: Pt. with PMH of sleep apnea noncompliant to CPAP, paroxysmal A. 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 Currently further plan is continue current care.  Subjective: No acute complaints no nausea no vomiting.  Feels like she has sharp stabbing chest pain on the left back.  Telemetry: Sinus tachycardia with PAC.  Assessment and Plan: Acute on chronic respiratory failure with hypoxia due to COPD exacerbation: Initially needed BiPAP Now on oxygen, home regimen Continue with antibiotics. Continue bronchodilator therapy with scheduled and as needed nebulizers, inhalational steroids, oral steroids.  Obstructive sleep apnea, noncompliant to CPAP.  I discussed with patient and family to be compliant that may help with reducing exacerbations and shortness of breath. Continue CPAP at night  Type 2 diabetes: Uncontrolled with hyperglycemia. Secondary to steroids. On metformin at home.  Suspect exacerbation with use of steroids.  Will use insulin coverage.  Paroxysmal A. fib: Currently sinus rhythm.  Rate controlled.  Resume metoprolol.  Subtherapeutic on Coumadin.  Pharmacy to dose.  Hypertension: Stable.  Adjusting home medication due to uncontrolled hypertension  Anemia appears chronic. Initial acute drop appears to be dilutional in nature. Repeat H&H for last 3 days have remained stable. No acute bleeding here in the hospital.    Iron studies are also unremarkable.  CAD Chest pain. Patient does have significant coronary disease. Her pain while atypical in nature is not reproducible. EKG shows some T wave inversions in anterolateral leads as well as subtle changes in the inferior leads. New symptoms were present on admission as well but not present on prior EKG. Troponin is negative We will add aspirin. Appreciate cardiology assistance. 2-day stress test secondary to BMI, finding suggest ischemia in the inferior, inferolateral, apical distributions   Different from prior scans   Cannot exclude some shifting soft tissue and therefore they would like to perform a cardiac catheterization. Tentatively scheduled for tomorrow.  Morbid obesity. Body mass index is 50.85 kg/m.  Dietary consultation.  Diet: cardiac diet DVT Prophylaxis: subcutaneous Heparin  Advance goals of care discussion: full code  Family Communication: family was present at bedside, at the time of interview.   Disposition:  Discharge to home.  Consultants: Cardiology Procedures: Nuclear medicine stress test  Scheduled Meds: . amLODipine  5 mg Oral BID  . ARIPiprazole  5 mg Oral Daily  . aspirin EC  81 mg Oral Daily  . benzonatate  100 mg Oral TID  . guaiFENesin  600 mg Oral BID  . insulin aspart  0-5 Units Subcutaneous QHS  . insulin aspart  0-9 Units Subcutaneous TID WC  . ipratropium-albuterol  3 mL Nebulization TID  . metoprolol tartrate  50 mg Oral BID  . mometasone-formoterol  2 puff Inhalation BID  . pantoprazole  40 mg Oral Daily  . sertraline  200 mg Oral Daily  . warfarin  15 mg Oral ONCE-1800  . Warfarin - Pharmacist Dosing Inpatient   Does not apply q1800   Continuous Infusions:  PRN Meds: acetaminophen **OR**  acetaminophen, albuterol, gabapentin, hydrALAZINE, menthol-cetylpyridinium, morphine, morphine injection, ondansetron (ZOFRAN) IV Antibiotics: Anti-infectives (From admission, onward)   Start     Dose/Rate  Route Frequency Ordered Stop   08/03/18 1000  cefdinir (OMNICEF) capsule 300 mg     300 mg Oral Every 12 hours 08/03/18 0752 08/05/18 2153   08/03/18 1000  azithromycin (ZITHROMAX) tablet 500 mg  Status:  Discontinued     500 mg Oral Daily 08/03/18 0752 08/04/18 1324   08/01/18 1700  cefTRIAXone (ROCEPHIN) 1 g in sodium chloride 0.9 % 100 mL IVPB  Status:  Discontinued     1 g 200 mL/hr over 30 Minutes Intravenous Every 24 hours 08/01/18 1601 08/03/18 0752       Objective: Physical Exam: Vitals:   08/06/18 0756 08/06/18 0943 08/06/18 1207 08/06/18 1303  BP: (!) 154/86 (!) 144/85 136/75   Pulse:  79 77   Resp:  14 16   Temp:  98.3 F (36.8 C) 97.8 F (36.6 C)   TempSrc:  Oral    SpO2:  100% 100% 96%  Weight:      Height:        Intake/Output Summary (Last 24 hours) at 08/06/2018 1657 Last data filed at 08/06/2018 1300 Gross per 24 hour  Intake 480 ml  Output 750 ml  Net -270 ml   Filed Weights   08/01/18 1900  Weight: 126.1 kg   General: Alert, Awake and Oriented to Time, Place and Person. Appear in moderate distress, affect appropriate Eyes: PERRL, Conjunctiva normal ENT: Oral Mucosa clear moist. Neck: no JVD, no Abnormal Mass Or lumps Cardiovascular: S1 and S2 Present, no Murmur, Peripheral Pulses Present Respiratory: increased respiratory effort, Bilateral Air entry equal and Decreased, no use of accessory muscle, bilateral  Crackles, bilateral  wheezes Abdomen: Bowel Sound present, Soft and no tenderness, no hernia Skin: no redness, no Rash, non induration Extremities: no Pedal edema, no calf tenderness Neurologic: Grossly no focal neuro deficit. Bilaterally Equal motor strength  Data Reviewed: CBC: Recent Labs  Lab 08/01/18 0831 08/02/18 0216 08/03/18 0453 08/04/18 0208 08/05/18 0308 08/06/18 0328  WBC 10.6* 6.3 10.3 8.7 9.8 7.9  NEUTROABS 5.3  --   --   --   --   --   HGB 11.5* 9.9* 9.8* 9.9* 10.8* 11.1*  HCT 39.4 34.8* 32.7* 33.2* 36.7 36.7  MCV  73.0* 73.0* 71.9* 71.2* 69.8* 70.0*  PLT 259 249 259 270 297 710   Basic Metabolic Panel: Recent Labs  Lab 08/02/18 0216 08/03/18 0453 08/04/18 0208 08/05/18 0308 08/06/18 0328  NA 140 139 141 139 138  K 4.7 4.4 4.5 3.9 3.5  CL 102 100 101 96* 96*  CO2 28 34* 32 30 32  GLUCOSE 141* 99 101* 127* 92  BUN 13 17 20 16 11   CREATININE 0.86 0.85 0.78 0.68 0.67  CALCIUM 9.2 9.2 9.2 9.6 9.5  MG  --  2.2  --   --   --     Liver Function Tests: Recent Labs  Lab 08/01/18 0831  AST 33  ALT 33  ALKPHOS 76  BILITOT 0.5  PROT 7.8  ALBUMIN 3.7   No results for input(s): LIPASE, AMYLASE in the last 168 hours. No results for input(s): AMMONIA in the last 168 hours. Coagulation Profile: Recent Labs  Lab 08/02/18 0216 08/03/18 0453 08/04/18 0208 08/05/18 0308 08/06/18 0328  INR 1.84 2.31 2.10 1.88 1.70   Cardiac Enzymes: Recent Labs  Lab 08/04/18 1147 08/04/18 1956 08/05/18 0014  TROPONINI <0.03 <0.03 <0.03   BNP (last 3 results) No results for input(s): PROBNP in the last 8760 hours. CBG: Recent Labs  Lab 08/05/18 1656 08/05/18 2116 08/06/18 0734 08/06/18 1155 08/06/18 1604  GLUCAP 111* 108* 101* 90 106*   Studies: Nm Myocar Multi W/spect W/wall Motion / Ef  Result Date: 08/06/2018  Decreased tracer activity in the inferior, inferolateral, distal lateral and apical wals that inproves in recovery consistent with medium size area of moderate ischemia Note patient's size limits scan some due to soft tissue attenuation.  This is an intermediate risk study.  Nuclear stress EF: 59%.      Time spent: 35 minutes  Author: Berle Mull, MD [facility] 08/06/2018 4:57 PM  Between 7PM-7AM, please contact night-coverage at [email or web address]

## 2018-08-06 NOTE — Progress Notes (Signed)
Progress Note  Patient Name: Michele [name] [facility] Date of Encounter: 08/06/2018  Primary Cardiologist: Quay Burow, MD   Subjective   No CP or back pain    Still with some SOB    Inpatient Medications    Scheduled Meds: . amLODipine  5 mg Oral BID  . ARIPiprazole  5 mg Oral Daily  . aspirin EC  81 mg Oral Daily  . benzonatate  100 mg Oral TID  . guaiFENesin  600 mg Oral BID  . insulin aspart  0-5 Units Subcutaneous QHS  . insulin aspart  0-9 Units Subcutaneous TID WC  . ipratropium-albuterol  3 mL Nebulization TID  . metoprolol tartrate  50 mg Oral BID  . mometasone-formoterol  2 puff Inhalation BID  . pantoprazole  40 mg Oral Daily  . sertraline  200 mg Oral Daily  . warfarin  15 mg Oral ONCE-1800  . Warfarin - Pharmacist Dosing Inpatient   Does not apply q1800   Continuous Infusions:  PRN Meds: acetaminophen **OR** acetaminophen, albuterol, gabapentin, hydrALAZINE, menthol-cetylpyridinium, morphine, morphine injection, ondansetron (ZOFRAN) IV   Vital Signs    Vitals:   08/06/18 0756 08/06/18 0943 08/06/18 1207 08/06/18 1303  BP: (!) 154/86 (!) 144/85 136/75   Pulse:  79 77   Resp:  14 16   Temp:  98.3 F (36.8 C) 97.8 F (36.6 C)   TempSrc:  Oral    SpO2:  100% 100% 96%  Weight:      Height:        Intake/Output Summary (Last 24 hours) at 08/06/2018 1502 Last data filed at 08/06/2018 1300 Gross per 24 hour  Intake 480 ml  Output 750 ml  Net -270 ml   Last 3 Weights 08/01/2018 06/28/2018 06/26/2018  Weight (lbs) 278 lb 283 lb 12.8 oz 283 lb  Weight (kg) 126.1 kg 128.731 kg 128.368 kg  Some encounter information is confidential and restricted. Go to Review Flowsheets activity to see all data.      Telemetry    NSR - Personally Reviewed  ECG      Physical Exam   GEN: Morbidly obese 68 yo acute distress.   Neck: Neck is full   Cardiac: RRR, no murmurs, rubs, or gallops.  Respiratory: Mild diffuse wheezes   GI: Soft, nontender,  non-distended  MS: Triv edema; No deformity. Neuro:  Nonfocal  Psych: Normal affect   Labs    Chemistry Recent Labs  Lab 08/01/18 0831  08/04/18 0208 08/05/18 0308 08/06/18 0328  NA 140   < > 141 139 138  K 4.0   < > 4.5 3.9 3.5  CL 104   < > 101 96* 96*  CO2 25   < > 32 30 32  GLUCOSE 129*   < > 101* 127* 92  BUN 10   < > 20 16 11   CREATININE 0.74   < > 0.78 0.68 0.67  CALCIUM 9.1   < > 9.2 9.6 9.5  PROT 7.8  --   --   --   --   ALBUMIN 3.7  --   --   --   --   AST 33  --   --   --   --   ALT 33  --   --   --   --   ALKPHOS 76  --   --   --   --   BILITOT 0.5  --   --   --   --  GFRNONAA >60   < > >60 >60 >60  GFRAA >60   < > >60 >60 >60  ANIONGAP 11   < > 8 13 10    < > = values in this interval not displayed.     Hematology Recent Labs  Lab 08/04/18 0208 08/05/18 0308 08/06/18 0328  WBC 8.7 9.8 7.9  RBC 4.66 5.26* 5.24*  HGB 9.9* 10.8* 11.1*  HCT 33.2* 36.7 36.7  MCV 71.2* 69.8* 70.0*  MCH 21.2* 20.5* 21.2*  MCHC 29.8* 29.4* 30.2  RDW 19.2* 18.8* 19.0*  PLT 270 297 345    Cardiac Enzymes Recent Labs  Lab 08/04/18 1147 08/04/18 1956 08/05/18 0014  TROPONINI <0.03 <0.03 <0.03    Recent Labs  Lab 08/01/18 0838  TROPIPOC 0.01     BNPNo results for input(s): BNP, PROBNP in the last 168 hours.   DDimer No results for input(s): DDIMER in the last 168 hours.   Radiology    Nm Myocar Multi W/spect W/wall Motion / Ef  Result Date: 08/06/2018  Decreased tracer activity in the inferior, inferolateral, distal lateral and apical wals that inproves in recovery consistent with medium size area of moderate ischemia Note patient's size limits scan some due to soft tissue attenuation.  This is an intermediate risk study.  Nuclear stress EF: 59%.     Cardiac Studies   Echo 09/25/2017 LV EF: 65% -   70%  ------------------------------------------------------------------- Indications:      Chest pain  786.51.  ------------------------------------------------------------------- History:   PMH:   Atrial fibrillation.  Coronary artery disease. PMH:  Sleep Apnea. Acute Renal Failure.  Risk factors: Hypertension. Diabetes mellitus. Dyslipidemia.  ------------------------------------------------------------------- Study Conclusions  - Left ventricle: The cavity size was normal. Wall thickness was   increased in a pattern of mild LVH. Systolic function was   vigorous. The estimated ejection fraction was in the range of 65%   to 70%. Wall motion was normal; there were no regional wall   motion abnormalities. Doppler parameters are consistent with   abnormal left ventricular relaxation (grade 1 diastolic   dysfunction). - Aortic valve: Poorly visualized. Probably trileaflet. There was   no stenosis. - Mitral valve: There was no significant regurgitation. - Right ventricle: The cavity size was normal. Systolic function   was normal. - Pulmonary arteries: No complete TR doppler jet so unable to   estimate PA systolic pressure. - Inferior vena cava: The vessel was normal in size. The   respirophasic diameter changes were in the normal range (>= 50%),   consistent with normal central venous pressure. - Pericardium, extracardiac: A trivial pericardial effusion was   identified.  Impressions:  - Normal LV size with mild LV hypertrophy. EF 65-70%, vigorous   systolic function. Normal RV size and systolic function. No   significant valvular abnormalities.  Patient Profile     68 y.o. female with a hx of CAD with stent to pRCA 2008, DM-2, HTN, sleep apnea, asthma, PAF, HLD and arthritis who was admitted for wheezing and SOB, she also complained of chest pain  Assessment & Plan    1. Chest pain: Admit EKG showed anterior TWI, however repeat EKG showed resolution. Pt r/o for MI   Myovue done yesterday, today    DIfficult study due to size   COnsistent with ischemia in the inferior,  inferolateral, apical distributions   Different from prior scans   Cannot exclude some shifting soft tissue    Based on above with changes and profound presentation would  recomm L heart cath to define anatomy and LVEDP   Risks and benefits discribed   Pt understands and agrees to proceed.  2. CAD s/p stent to pRCA 2008: intermittent chest pain at rest. See #  3. HTN: BP is better   4. HLD:  Would restart home Crestor    5. DM II: defer to PCP  6. PAF: on coumadin  7. Morbid obesity.   8. COPD exacerbation  Pt on steroids   Still with mild wheezing   Notes some reflux (bitter taste in mouth at times)  This may be exacerbating pulmonary as well  Wears 2 L O2 at home         For questions or updates, please contact [facility] Please consult [email or web address] for contact info under        Signed, Dorris Carnes, MD  08/06/2018, 3:02 PM

## 2018-08-06 NOTE — Discharge Instructions (Signed)

## 2018-08-06 NOTE — Progress Notes (Signed)
ANTICOAGULATION CONSULT NOTE - Follow-Up Consult  Pharmacy Consult for Warfarin Indication: atrial fibrillation  Allergies  Allergen Reactions  . Meprobamate Nausea And Vomiting    Patient Measurements: Height: 5\' 2"  (157.5 cm) Weight: 278 lb (126.1 kg) IBW/kg (Calculated) : 50.1 Vital Signs: Temp: 98.3 F (36.8 C) (01/19 0943) Temp Source: Oral (01/19 0943) BP: 144/85 (01/19 0943) Pulse Rate: 79 (01/19 0943)  Labs: Recent Labs    08/04/18 0208 08/04/18 1147 08/04/18 1956 08/05/18 0014 08/05/18 0308 08/06/18 0328  HGB 9.9*  --   --   --  10.8* 11.1*  HCT 33.2*  --   --   --  36.7 36.7  PLT 270  --   --   --  297 345  LABPROT 23.3*  --   --   --  21.3* 19.7*  INR 2.10  --   --   --  1.88 1.70  CREATININE 0.78  --   --   --  0.68 0.67  TROPONINI  --  <0.03 <0.03 <0.03  --   --     Estimated Creatinine Clearance: 86.7 mL/min (by C-G formula based on SCr of 0.67 mg/dL).   Medical History: Past Medical History:  Diagnosis Date  . Allergic rhinitis   . ARF (acute renal failure) ([place]) 03/2017  . 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 Asthma   . CAD (coronary artery disease) 01/30/2007   stents trivial coronary artery disease diffusely, with a recent deployment od a intracoronary artery stent, 3.5 x 12 mm driver stent with no more than 20-30% in- stents restenosis.done by Dr [name] with re-look on novenber 10 2008 revealing a widely patent stent with otherwise trival CAD and normal LV function  . Carpal tunnel syndrome, right   . Chest pain 07/17/12009   2 D Echo EF >55%  . Chronic lower back pain   . Complication of anesthesia    slow to wake up  . COPD (chronic obstructive pulmonary disease) ([place])   . DDD (degenerative disc disease), lumbar    multilevel lumbar degenerative disc disease  . Depression   . Diabetes mellitus type II   . Gait instability   . GERD (gastroesophageal reflux disease)   . History of kidney stones   . History of  stomach ulcers    "years ago" (08/01/2018)  . Hyperlipidemia   . Hypertension   . IBS (irritable bowel syndrome)   . Iron deficiency anemia 07/13/2016  . Obesity   . On home oxygen therapy    "2L 24/7" (08/01/2018)  . OSA on CPAP    "w/O2" (08/01/2018)  . Paroxysmal A-fib ([place])   . PTSD (post-traumatic stress disorder)   . Stroke [facility])    "not sure when; showed up on one of my tests" (08/01/2018)  . TIA (transient ischemic attack)    "several; they say I have small ones" (08/01/2018)  . Urinary incontinence     Assessment: 68 yo female admitted with a COPD exacerbation. Pharmacy consulted to restart Coumadin for atrial fibrillation.   The patient was seen in the anti-coagulation clinic on 1/13 and was found to have a SUBtherapeutic INR of 1.5 - the patient was instructed to take 15 mg on 1/13 - then resume PTA dosing of 11.25 mg daily EXCEPT for 7.5 mg on MWF.  INR drops again today after the 10mg  dose yesterday. We will increase to the high dose. She is off of abx now also.   Goal  of Therapy:  INR 2-3   Plan:   Coumadin 15mg  PO x1 Daily INR  Onnie Boer, PharmD, [place], AAHIVP, CPP Infectious Disease Pharmacist 08/06/2018 10:16 AM

## 2018-08-07 DIAGNOSIS — R9439 Abnormal result of other cardiovascular function study: Secondary | ICD-10-CM

## 2018-08-07 LAB — CBC
HCT: 36.1 % (ref 36.0–46.0)
HEMOGLOBIN: 10.6 g/dL — AB (ref 12.0–15.0)
MCH: 20.5 pg — ABNORMAL LOW (ref 26.0–34.0)
MCHC: 29.4 g/dL — ABNORMAL LOW (ref 30.0–36.0)
MCV: 70 fL — ABNORMAL LOW (ref 80.0–100.0)
Platelets: 270 10*3/uL (ref 150–400)
RBC: 5.16 MIL/uL — ABNORMAL HIGH (ref 3.87–5.11)
RDW: 18.8 % — AB (ref 11.5–15.5)
WBC: 8.8 10*3/uL (ref 4.0–10.5)
nRBC: 0 % (ref 0.0–0.2)

## 2018-08-07 LAB — PROTIME-INR
INR: 2.02
Prothrombin Time: 22.6 seconds — ABNORMAL HIGH (ref 11.4–15.2)

## 2018-08-07 LAB — GLUCOSE, CAPILLARY
GLUCOSE-CAPILLARY: 116 mg/dL — AB (ref 70–99)
Glucose-Capillary: 82 mg/dL (ref 70–99)
Glucose-Capillary: 156 mg/dL — ABNORMAL HIGH (ref 70–99)
Glucose-Capillary: 117 mg/dL — ABNORMAL HIGH (ref 70–99)

## 2018-08-07 LAB — BASIC METABOLIC PANEL
Anion gap: 9 (ref 5–15)
BUN: 20 mg/dL (ref 8–23)
CO2: 31 mmol/L (ref 22–32)
Calcium: 9.3 mg/dL (ref 8.9–10.3)
Chloride: 96 mmol/L — ABNORMAL LOW (ref 98–111)
Creatinine, Ser: 0.79 mg/dL (ref 0.44–1.00)
GFR calc Af Amer: >60 mL/min (ref >60)
GFR calc non Af Amer: >60 mL/min (ref >60)
Glucose, Bld: 98 mg/dL (ref 70–99)
POTASSIUM: 3.6 mmol/L (ref 3.5–5.1)
Sodium: 136 mmol/L (ref 135–145)

## 2018-08-07 MED ORDER — ASPIRIN 81 MG PO CHEW: 81.0000 mg | CHEWABLE_TABLET | ORAL | Status: AC

## 2018-08-07 MED ORDER — ISOSORBIDE MONONITRATE ER 30 MG PO TB24
30.0000 mg | ORAL_TABLET | Freq: Every day | ORAL | Status: DC
  Administered 2018-08-07 – 2018-08-10 (×4): 30 mg via ORAL
  Filled 2018-08-07 (×4): qty 1

## 2018-08-07 MED ORDER — SODIUM CHLORIDE 0.9% FLUSH: 3.0000 mL | INTRAVENOUS | Status: DC | PRN

## 2018-08-07 MED ORDER — SODIUM CHLORIDE 0.9% FLUSH
3.0000 mL | Freq: Two times a day (BID) | INTRAVENOUS | Status: DC
  Administered 2018-08-07 – 2018-08-09 (×5): 3 mL via INTRAVENOUS

## 2018-08-07 MED ORDER — NITROGLYCERIN 0.4 MG SL SUBL
0.4000 mg | SUBLINGUAL_TABLET | SUBLINGUAL | Status: DC | PRN
  Administered 2018-08-09 (×2): 0.4 mg via SUBLINGUAL
  Filled 2018-08-07: qty 1

## 2018-08-07 MED ORDER — ENSURE ENLIVE PO LIQD: 237.0000 mL | Freq: Once | ORAL | Status: DC

## 2018-08-07 MED ORDER — SODIUM CHLORIDE 0.9 % IV SOLN: 250.0000 mL | INTRAVENOUS | Status: DC | PRN

## 2018-08-07 MED ORDER — SODIUM CHLORIDE 0.9 % IV SOLN: INTRAVENOUS | Status: DC

## 2018-08-07 NOTE — Progress Notes (Signed)
[facility] Progress Note  Patient: Michele [name] [name] JXB:[id number]   PCP: [name], MD DOB: 04-Sep-1950   DOA: 08/01/2018   DOS: 08/07/2018   Date of Service: the patient was seen and examined on 08/07/2018  Brief hospital course: Pt. with PMH of sleep apnea noncompliant to CPAP, paroxysmal A. 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 Currently further plan is continue current care.  Subjective: Chest pain this morning.  No nausea sleepy secondary to pain medication.  Telemetry: Sinus tachycardia with PAC.  Assessment and Plan: Acute on chronic respiratory failure with hypoxia due to COPD exacerbation: Initially needed BiPAP Now on oxygen, home regimen Continue with antibiotics. Continue bronchodilator therapy with scheduled and as needed nebulizers, inhalational steroids, oral steroids.  Obstructive sleep apnea, noncompliant to CPAP.  discussed with patient and family to be compliant that may help with reducing exacerbations and shortness of breath. Continue CPAP at night  Type 2 diabetes: Uncontrolled with hyperglycemia. Secondary to steroids. On metformin at home.  Suspect exacerbation with use of steroids.  Will use insulin coverage.  Paroxysmal A. fib: Currently sinus rhythm.  Rate controlled.  Resume metoprolol. Hold Coumadin for now for cath  Hypertension: Stable.  Adjusting home medication due to uncontrolled hypertension  Anemia appears chronic. Initial acute drop appears to be dilutional in nature. Repeat H&H for last 3 days have remained stable. No acute bleeding here in the hospital.  Iron studies are also unremarkable.  CAD Chest  pain. Patient does have significant coronary disease. Her pain while atypical in nature is not reproducible. EKG shows some T wave inversions in anterolateral leads as well as subtle changes in the inferior leads. New symptoms were present on admission as well but not present on prior EKG. Troponin is negative We will add aspirin. Appreciate cardiology assistance. 2-day stress test secondary to BMI, finding suggest ischemia in the inferior, inferolateral, apical distributions   Different from prior scans   Cannot exclude some shifting soft tissue and therefore they would like to perform a cardiac catheterization. Tentatively scheduled for Wednesday secondary to elevated INR.  Morbid obesity. Body mass index is 50.85 kg/m.  Dietary consultation.  Diet: cardiac diet DVT Prophylaxis: subcutaneous Heparin  Advance goals of care discussion: full code  Family Communication: family was present at bedside, at the time of interview.   Disposition:  Discharge to home.  Consultants: Cardiology Procedures: Nuclear medicine stress test  Scheduled Meds: . amLODipine  5 mg Oral BID  . ARIPiprazole  5 mg Oral Daily  . aspirin  81 mg Oral Pre-Cath  . aspirin EC  81 mg Oral Daily  . benzonatate  100 mg Oral TID  . feeding supplement (ENSURE ENLIVE)  237 mL Oral Once  . guaiFENesin  600 mg Oral BID  . insulin aspart  0-5 Units Subcutaneous QHS  . insulin aspart  0-9 Units Subcutaneous TID WC  . ipratropium-albuterol  3 mL Nebulization TID  . isosorbide mononitrate  30 mg Oral Daily  . metoprolol tartrate  50 mg Oral BID  . mometasone-formoterol  2 puff Inhalation BID  . pantoprazole  40 mg Oral Daily  . sertraline  200 mg Oral Daily  . sodium chloride flush  3 mL Intravenous Q12H  Continuous Infusions: . sodium chloride     PRN Meds: sodium chloride, acetaminophen **OR** acetaminophen, albuterol, gabapentin, hydrALAZINE, menthol-cetylpyridinium, morphine, morphine injection,  nitroGLYCERIN, ondansetron (ZOFRAN) IV, sodium chloride flush Antibiotics: Anti-infectives (From admission, onward)   Start     Dose/Rate Route Frequency Ordered Stop   08/03/18 1000  cefdinir (OMNICEF) capsule 300 mg     300 mg Oral Every 12 hours 08/03/18 0752 08/05/18 2153   08/03/18 1000  azithromycin (ZITHROMAX) tablet 500 mg  Status:  Discontinued     500 mg Oral Daily 08/03/18 0752 08/04/18 1324   08/01/18 1700  cefTRIAXone (ROCEPHIN) 1 g in sodium chloride 0.9 % 100 mL IVPB  Status:  Discontinued     1 g 200 mL/hr over 30 Minutes Intravenous Every 24 hours 08/01/18 1601 08/03/18 0752       Objective: Physical Exam: Vitals:   08/07/18 1018 08/07/18 1448 08/07/18 1535 08/07/18 1539  BP: 128/64  (!) 80/53 106/63  Pulse: 62  77 77  Resp:   12   Temp:   98 F (36.7 C)   TempSrc:      SpO2:  98% 96%   Weight:      Height:       No intake or output data in the 24 hours ending 08/07/18 1905 Filed Weights   08/01/18 1900  Weight: 126.1 kg   General: Alert, Awake and Oriented to Time, Place and Person. Appear in moderate distress, affect appropriate Eyes: PERRL, Conjunctiva normal ENT: Oral Mucosa clear moist. Neck: no JVD, no Abnormal Mass Or lumps Cardiovascular: S1 and S2 Present, no Murmur, Peripheral Pulses Present Respiratory: increased respiratory effort, Bilateral Air entry equal and Decreased, no use of accessory muscle, bilateral  Crackles, bilateral  wheezes Abdomen: Bowel Sound present, Soft and no tenderness, no hernia Skin: no redness, no Rash, non induration Extremities: no Pedal edema, no calf tenderness Neurologic: Grossly no focal neuro deficit. Bilaterally Equal motor strength  Data Reviewed: CBC: Recent Labs  Lab 08/01/18 0831  08/03/18 0453 08/04/18 0208 08/05/18 0308 08/06/18 0328 08/07/18 0221  WBC 10.6*   < > 10.3 8.7 9.8 7.9 8.8  NEUTROABS 5.3  --   --   --   --   --   --   HGB 11.5*   < > 9.8* 9.9* 10.8* 11.1* 10.6*  HCT 39.4   < >  32.7* 33.2* 36.7 36.7 36.1  MCV 73.0*   < > 71.9* 71.2* 69.8* 70.0* 70.0*  PLT 259   < > 259 270 297 345 270   < > = values in this interval not displayed.   Basic Metabolic Panel: Recent Labs  Lab 08/03/18 0453 08/04/18 0208 08/05/18 0308 08/06/18 0328 08/07/18 0221  NA 139 141 139 138 136  K 4.4 4.5 3.9 3.5 3.6  CL 100 101 96* 96* 96*  CO2 34* 32 30 32 31  GLUCOSE 99 101* 127* 92 98  BUN 17 20 16 11 20   CREATININE 0.85 0.78 0.68 0.67 0.79  CALCIUM 9.2 9.2 9.6 9.5 9.3  MG 2.2  --   --   --   --     Liver Function Tests: Recent Labs  Lab 08/01/18 0831  AST 33  ALT 33  ALKPHOS 76  BILITOT 0.5  PROT 7.8  ALBUMIN 3.7   No results for input(s): LIPASE, AMYLASE in the last 168 hours. No results for input(s): AMMONIA in the last 168 hours. Coagulation Profile: Recent Labs  Lab 08/03/18 0453 08/04/18  4604 08/05/18 0308 08/06/18 0328 08/07/18 0221  INR 2.31 2.10 1.88 1.70 2.02   Cardiac Enzymes: Recent Labs  Lab 08/04/18 1147 08/04/18 1956 08/05/18 0014  TROPONINI <0.03 <0.03 <0.03   BNP (last 3 results) No results for input(s): PROBNP in the last 8760 hours. CBG: Recent Labs  Lab 08/06/18 1604 08/06/18 2103 08/07/18 0818 08/07/18 1153 08/07/18 1635  GLUCAP 106* 145* 82 116* 156*   Studies: No results found.   Time spent: 35 minutes  Author: Berle Mull, MD [facility] 08/07/2018 7:05 PM  Between 7PM-7AM, please contact night-coverage at [email or web address]

## 2018-08-07 NOTE — Progress Notes (Signed)
ANTICOAGULATION CONSULT NOTE - Follow-Up Consult  Pharmacy Consult for Warfarin Indication: atrial fibrillation  Allergies  Allergen Reactions  . Meprobamate Nausea And Vomiting    Patient Measurements: Height: 5\' 2"  (157.5 cm) Weight: 278 lb (126.1 kg) IBW/kg (Calculated) : 50.1   Vital Signs: Temp: 97.8 F (36.6 C) (01/20 0813) Temp Source: Oral (01/20 0813) BP: 128/64 (01/20 1018) Pulse Rate: 62 (01/20 1018)  Labs: Recent Labs    08/04/18 1147 08/04/18 1956 08/05/18 0014  08/05/18 0308 08/06/18 0328 08/07/18 0221  HGB  --   --   --    < > 10.8* 11.1* 10.6*  HCT  --   --   --   --  36.7 36.7 36.1  PLT  --   --   --   --  297 345 270  LABPROT  --   --   --   --  21.3* 19.7* 22.6*  INR  --   --   --   --  1.88 1.70 2.02  CREATININE  --   --   --   --  0.68 0.67 0.79  TROPONINI <0.03 <0.03 <0.03  --   --   --   --    < > = values in this interval not displayed.    Estimated Creatinine Clearance: 86.7 mL/min (by C-G formula based on SCr of 0.79 mg/dL).   Medical History: Past Medical History:  Diagnosis Date  . Allergic rhinitis   . ARF (acute renal failure) ([place]) 03/2017  . 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 Asthma   . CAD (coronary artery disease) 01/30/2007   stents trivial coronary artery disease diffusely, with a recent deployment od a intracoronary artery stent, 3.5 x 12 mm driver stent with no more than 20-30% in- stents restenosis.done by Dr [name] with re-look on novenber 10 2008 revealing a widely patent stent with otherwise trival CAD and normal LV function  . Carpal tunnel syndrome, right   . Chest pain 07/17/12009   2 D Echo EF >55%  . Chronic lower back pain   . Complication of anesthesia    slow to wake up  . COPD (chronic obstructive pulmonary disease) ([place])   . DDD (degenerative disc disease), lumbar    multilevel lumbar degenerative disc disease  . Depression   . Diabetes mellitus type II   . Gait instability    . GERD (gastroesophageal reflux disease)   . History of kidney stones   . History of stomach ulcers    "years ago" (08/01/2018)  . Hyperlipidemia   . Hypertension   . IBS (irritable bowel syndrome)   . Iron deficiency anemia 07/13/2016  . Obesity   . On home oxygen therapy    "2L 24/7" (08/01/2018)  . OSA on CPAP    "w/O2" (08/01/2018)  . Paroxysmal A-fib ([place])   . PTSD (post-traumatic stress disorder)   . Stroke [facility])    "not sure when; showed up on one of my tests" (08/01/2018)  . TIA (transient ischemic attack)    "several; they say I have small ones" (08/01/2018)  . Urinary incontinence     Assessment: 68 yo female admitted with a COPD exacerbation. Pharmacy consulted to restart Coumadin for atrial fibrillation.   The patient was seen in the anti-coagulation clinic on 1/13 and was found to have a SUBtherapeutic INR of 1.5 - the patient was instructed to take 15 mg on 1/13 - then resume PTA dosing of  11.25 mg daily EXCEPT for 7.5 mg on MWF.  INR came back therapeutic at 2.02 after increased dose last night. Hgb 10.6, plt 270. Plan for possible cath once INR<1.8.    Goal of Therapy:  INR 2-3   Plan:  Hold warfarin therapy  Daily INR Monitor for s/sx of bleeding  Antonietta Jewel, PharmD, BCCCP Clinical Pharmacist  Pager: [phone] Phone: [phone] 08/07/2018 10:54 AM

## 2018-08-07 NOTE — Progress Notes (Signed)
Physical Therapy Treatment Patient Details Name: Michele [name] MRN: [id number] DOB: 1950-10-18 Today's Date: 08/07/2018    History of Present Illness Michele [name] is a 68 y.o. female with medical history significant of sleep apnea noncompliant to CPAP, paroxysmal A. 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 She has been experiencing ongoing shortness of breath and wheezing for about a month. Patient was admitted 1/14 with COPD exacerbation.     PT Comments    Continuing work on functional mobility and activity tolerance;  Noting very nice improvements in activity tolerance, able to significantly incr amb distance with decr supplemental O2 compared to last session; We discussed use of a Rollator RW, which can be beneficial for patient with Respiratory issues   Follow Up Recommendations  Home health PT;Supervision/Assistance - 24 hour     Equipment Recommendations  Other (comment)(Consider Rollator/4 wheeled RW)    Recommendations for Other Services       Precautions / Restrictions Precautions Precautions: Fall Precaution Comments: Fall risk greatly reduced with use of RW    Mobility  Bed Mobility                  Transfers Overall transfer level: Needs assistance Equipment used: Rolling walker (2 wheeled) Transfers: Sit to/from Stand Sit to Stand: Supervision         General transfer comment: min guard for stability   Ambulation/Gait Ambulation/Gait assistance: Min guard Gait Distance (Feet): 110 Feet Assistive device: Rolling walker (2 wheeled) Gait Pattern/deviations: Step-to pattern;Step-through pattern Gait velocity: decreased   General Gait Details: Walked in hallway on 2 L supplemental O2; Cues to self-monitor for activity tolerance   Stairs              Wheelchair Mobility    Modified Rankin (Stroke Patients Only)       Balance Overall balance assessment: Mild deficits observed, not formally tested                                          Cognition Arousal/Alertness: Awake/alert Behavior During Therapy: WFL for tasks assessed/performed Overall Cognitive Status: Within Functional Limits for tasks assessed                                 General Comments: most likely memory deficits at baseline      Exercises      General Comments        Pertinent Vitals/Pain Pain Assessment: No/denies pain    Home Living                      Prior Function            PT Goals (current goals can now be found in the care plan section) Acute Rehab PT Goals Patient Stated Goal: to go home and get stronger PT Goal Formulation: With patient Potential to Achieve Goals: Good Progress towards PT goals: Progressing toward goals    Frequency    Min 3X/week      PT Plan Current plan remains appropriate;Equipment recommendations need to be updated    Co-evaluation              AM-PAC  PT "6 Clicks" Mobility   Outcome Measure  Help needed turning from your back to your side while in a flat bed without using bedrails?: None Help needed moving from lying on your back to sitting on the side of a flat bed without using bedrails?: None Help needed moving to and from a bed to a chair (including a wheelchair)?: None Help needed standing up from a chair using your arms (e.g., wheelchair or bedside chair)?: None Help needed to walk in hospital room?: A Little Help needed climbing 3-5 steps with a railing? : A Little 6 Click Score: 22    End of Session Equipment Utilized During Treatment: Gait belt Activity Tolerance: Patient tolerated treatment well Patient left: in chair;with call bell/phone within reach   PT Visit Diagnosis: Unsteadiness on feet (R26.81);Difficulty in walking, not  elsewhere classified (R26.2);Other abnormalities of gait and mobility (R26.89)     Time: 7915-0569 PT Time Calculation (min) (ACUTE ONLY): 19 min  Charges:  $Gait Training: 8-22 mins                     Roney Marion, PT  Acute Rehabilitation Services Pager [phone] Office [phone]    Michele [name] 08/07/2018, 4:09 PM

## 2018-08-07 NOTE — Progress Notes (Signed)
Occupational Therapy Treatment Patient Details Name: Michele [name] MRN: [id number] DOB: 05-12-1951 Today's Date: 08/07/2018    History of present illness Michele [name] is a 68 y.o. female with medical history significant of sleep apnea noncompliant to CPAP, paroxysmal A. 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 She has been experiencing ongoing shortness of breath and wheezing for about a month. Patient was admitted 1/14 with COPD exacerbation.    OT comments  Pt making good progress toward goals. Able to complete ADL session @ RW level with occasional min A for LB ADL with 3/4 dyspnea. VSS on 2L.  Will assess use of AE to assist with hygiene after toileting and LB ADL. Pt very appreciative. Pt's PCA able to assist safely with [facility] transfers. Will continue to follow acutely.  Follow Up Recommendations  Home health OT;Supervision/Assistance - 24 hour    Equipment Recommendations  3 in 1 bedside commode    Recommendations for Other Services      Precautions / Restrictions Precautions Precautions: Fall Restrictions Weight Bearing Restrictions: No       Mobility Bed Mobility Overal bed mobility: Modified Independent                Transfers Overall transfer level: Needs assistance Equipment used: Rolling walker (2 wheeled) Transfers: Sit to/from Stand Sit to Stand: Supervision              Balance Overall balance assessment: Mild deficits observed, not formally tested                                         ADL either performed or assessed with clinical judgement   ADL Overall ADL's : Needs assistance/impaired     Grooming: Oral care;Set up;Standing   Upper Body Bathing: Set up;Sitting   Lower Body Bathing: Minimal assistance;Sit to/from stand        Lower Body Dressing: Set up;Sit to/from stand Lower Body Dressing Details (indicate cue type and reason): may benefit from AE Toilet Transfer: Supervision/safety;Ambulation;RW;BSC(BSC over toilet)           Functional mobility during ADLs: Supervision/safety;Rolling walker General ADL Comments: Caregiver educated on transfer to [facility] adn able to verbalize understanding.      Vision   Additional Comments: wears glasses   Perception     Praxis      Cognition Arousal/Alertness: Awake/alert Behavior During Therapy: WFL for tasks assessed/performed Overall Cognitive Status: Within Functional Limits for tasks assessed                                 General Comments: most likely memory deficits at baseline        Exercises     Shoulder Instructions       General Comments      Pertinent Vitals/ Pain       Pain Assessment: No/denies pain  Home Living                                          Prior Functioning/Environment  Frequency  Min 2X/week        Progress Toward Goals  OT Goals(current goals can now be found in the care plan section)  Progress towards OT goals: Progressing toward goals  Acute Rehab OT Goals Patient Stated Goal: to go home and get stronger OT Goal Formulation: With patient Time For Goal Achievement: 08/16/18 Potential to Achieve Goals: Good ADL Goals Pt Will Perform Lower Body Dressing: with set-up Pt Will Transfer to Toilet: with modified independence Additional ADL Goal #1: Pt will demonstrate 3 energy conservation strategies during ADL.  Plan Discharge plan remains appropriate    Co-evaluation                 AM-PAC OT "6 Clicks" Daily Activity     Outcome Measure   Help from another person eating meals?: None Help from another person taking care of personal grooming?: A Little Help from another person toileting, which includes using toliet, bedpan, or urinal?: A  Little Help from another person bathing (including washing, rinsing, drying)?: A Little Help from another person to put on and taking off regular upper body clothing?: A Little Help from another person to put on and taking off regular lower body clothing?: A Little 6 Click Score: 19    End of Session Equipment Utilized During Treatment: Rolling walker;Oxygen(2L)  OT Visit Diagnosis: History of falling (Z91.81);Other abnormalities of gait and mobility (R26.89)   Activity Tolerance Patient tolerated treatment well   Patient Left in chair;with call bell/phone within reach;with chair alarm set;with nursing/sitter in room   Nurse Communication Mobility status        Time: 1107-1140 OT Time Calculation (min): 33 min  Charges: OT General Charges $OT Visit: 1 Visit OT Treatments $Self Care/Home Management : 23-37 mins  Maurie Boettcher, OT/L   Acute OT Clinical Specialist [place] Pager [phone] Office [phone]    [facility] 08/07/2018, 12:02 PM

## 2018-08-07 NOTE — Progress Notes (Signed)
Progress Note  Patient Name: Michele [name] Date of Encounter: 08/07/2018  Primary Cardiologist: Quay Burow, MD   Subjective   Patient reports some chest pain this am. Received IV morphine early this am. Still with some SOB.     Inpatient Medications    Scheduled Meds: . amLODipine  5 mg Oral BID  . ARIPiprazole  5 mg Oral Daily  . aspirin  81 mg Oral Pre-Cath  . aspirin EC  81 mg Oral Daily  . benzonatate  100 mg Oral TID  . guaiFENesin  600 mg Oral BID  . insulin aspart  0-5 Units Subcutaneous QHS  . insulin aspart  0-9 Units Subcutaneous TID WC  . ipratropium-albuterol  3 mL Nebulization TID  . isosorbide mononitrate  30 mg Oral Daily  . metoprolol tartrate  50 mg Oral BID  . mometasone-formoterol  2 puff Inhalation BID  . pantoprazole  40 mg Oral Daily  . sertraline  200 mg Oral Daily  . sodium chloride flush  3 mL Intravenous Q12H   Continuous Infusions: . sodium chloride    . sodium chloride     PRN Meds: sodium chloride, acetaminophen **OR** acetaminophen, albuterol, gabapentin, hydrALAZINE, menthol-cetylpyridinium, morphine, morphine injection, nitroGLYCERIN, ondansetron (ZOFRAN) IV, sodium chloride flush   Vital Signs    Vitals:   08/06/18 2129 08/06/18 2214 08/06/18 2215 08/06/18 2313  BP:  128/74  133/85  Pulse: 70  70   Resp: 18   17  Temp:    97.6 F (36.4 C)  TempSrc:    Axillary  SpO2: 96%     Weight:      Height:        Intake/Output Summary (Last 24 hours) at 08/07/2018 0729 Last data filed at 08/06/2018 1859 Gross per 24 hour  Intake 720 ml  Output 600 ml  Net 120 ml   Last 3 Weights 08/01/2018 06/28/2018 06/26/2018  Weight (lbs) 278 lb 283 lb 12.8 oz 283 lb  Weight (kg) 126.1 kg 128.731 kg 128.368 kg  Some encounter information is confidential and restricted. Go to Review Flowsheets activity to see all data.      Telemetry    NSR - Personally Reviewed  ECG    None today  Physical Exam   GEN: Morbidly obese  BF in NAD on Bipap Neck: Neck is full   Cardiac: RRR, no murmurs, rubs, or gallops.  Respiratory: clear GI: Soft, nontender, non-distended  MS: Triv edema; No deformity. Neuro:  Nonfocal  Psych: Normal affect   Labs    Chemistry Recent Labs  Lab 08/01/18 0831  08/05/18 0308 08/06/18 0328 08/07/18 0221  NA 140   < > 139 138 136  K 4.0   < > 3.9 3.5 3.6  CL 104   < > 96* 96* 96*  CO2 25   < > 30 32 31  GLUCOSE 129*   < > 127* 92 98  BUN 10   < > 16 11 20   CREATININE 0.74   < > 0.68 0.67 0.79  CALCIUM 9.1   < > 9.6 9.5 9.3  PROT 7.8  --   --   --   --   ALBUMIN 3.7  --   --   --   --   AST 33  --   --   --   --   ALT 33  --   --   --   --   ALKPHOS 76  --   --   --   --  BILITOT 0.5  --   --   --   --   GFRNONAA >60   < > >60 >60 >60  GFRAA >60   < > >60 >60 >60  ANIONGAP 11   < > 13 10 9    < > = values in this interval not displayed.     Hematology Recent Labs  Lab 08/05/18 0308 08/06/18 0328 08/07/18 0221  WBC 9.8 7.9 8.8  RBC 5.26* 5.24* 5.16*  HGB 10.8* 11.1* 10.6*  HCT 36.7 36.7 36.1  MCV 69.8* 70.0* 70.0*  MCH 20.5* 21.2* 20.5*  MCHC 29.4* 30.2 29.4*  RDW 18.8* 19.0* 18.8*  PLT 297 345 270    Cardiac Enzymes Recent Labs  Lab 08/04/18 1147 08/04/18 1956 08/05/18 0014  TROPONINI <0.03 <0.03 <0.03    Recent Labs  Lab 08/01/18 0838  TROPIPOC 0.01     BNPNo results for input(s): BNP, PROBNP in the last 168 hours.   DDimer No results for input(s): DDIMER in the last 168 hours.   Radiology    Nm Myocar Multi W/spect W/wall Motion / Ef  Result Date: 08/06/2018  Decreased tracer activity in the inferior, inferolateral, distal lateral and apical wals that inproves in recovery consistent with medium size area of moderate ischemia Note patient's size limits scan some due to soft tissue attenuation.  This is an intermediate risk study.  Nuclear stress EF: 59%.     Cardiac Studies   Echo 09/25/2017 LV EF: 65% -    70%  ------------------------------------------------------------------- Indications:      Chest pain 786.51.  ------------------------------------------------------------------- History:   PMH:   Atrial fibrillation.  Coronary artery disease. PMH:  Sleep Apnea. Acute Renal Failure.  Risk factors: Hypertension. Diabetes mellitus. Dyslipidemia.  ------------------------------------------------------------------- Study Conclusions  - Left ventricle: The cavity size was normal. Wall thickness was   increased in a pattern of mild LVH. Systolic function was   vigorous. The estimated ejection fraction was in the range of 65%   to 70%. Wall motion was normal; there were no regional wall   motion abnormalities. Doppler parameters are consistent with   abnormal left ventricular relaxation (grade 1 diastolic   dysfunction). - Aortic valve: Poorly visualized. Probably trileaflet. There was   no stenosis. - Mitral valve: There was no significant regurgitation. - Right ventricle: The cavity size was normal. Systolic function   was normal. - Pulmonary arteries: No complete TR doppler jet so unable to   estimate PA systolic pressure. - Inferior vena cava: The vessel was normal in size. The   respirophasic diameter changes were in the normal range (>= 50%),   consistent with normal central venous pressure. - Pericardium, extracardiac: A trivial pericardial effusion was   identified.  Impressions:  - Normal LV size with mild LV hypertrophy. EF 65-70%, vigorous   systolic function. Normal RV size and systolic function. No   significant valvular abnormalities.  Patient Profile     68 y.o. female with a hx of CAD with stent to pRCA 2008, DM-2, HTN, sleep apnea, asthma, PAF, HLD and arthritis who was admitted for wheezing and SOB, she also complained of chest pain  Assessment & Plan    1. Chest pain: Admit EKG showed anterior TWI with subsquent resolution. Serial troponin levels  negative. Myovue done yesterday-   DIfficult study due to size. Consistent with ischemia in the inferior, inferolateral, apical distributions   Different from prior scans   Cannot exclude some shifting soft tissue artifact. Based on above with  changes and symptoms on presentation we would recomm L heart cath to define anatomy and LVEDP. Unfortunately Coumadin was not held and in fact patient received a higher dose of coumadin yesterday per pharmacy 15 mg. INR increased 1.88>>1.7>>2.02. She will not be able to have cath today. Would like INR< 1.8 for procedure. Given recurrent chest pain she should stay in [name] until cath can be done. Will hold coumadin. Check INR daily. Will do cath when INR < 1.8. since she received a higher dose of coumadin yesterday this will likely be Wednesday. Will add Imdur to regimen. She may eat today.  2. CAD s/p stent to [facility] 2008: intermittent chest pain at rest. Per #1.   3. HTN: BP is well controlled.   4. HLD:  Would restart home Crestor    5. DM II: defer to PCP  6. PAF: on coumadin. Maintaining NSR  7. Morbid obesity.   8. COPD exacerbation  Pt on steroids   Still with mild wheezing   Notes some reflux (bitter taste in mouth at times)  This may be exacerbating pulmonary as well. On Protonix.   Wears 2 L O2 at home         For questions or updates, please contact [place] Please consult [email or web address] for contact info under        Signed, Shyheem Whitham [place], MD  08/07/2018, 7:29 AM

## 2018-08-08 LAB — GLUCOSE, CAPILLARY
Glucose-Capillary: 89 mg/dL (ref 70–99)
Glucose-Capillary: 78 mg/dL (ref 70–99)
Glucose-Capillary: 78 mg/dL (ref 70–99)
Glucose-Capillary: 130 mg/dL — ABNORMAL HIGH (ref 70–99)

## 2018-08-08 LAB — PROTIME-INR
INR: 2.45
Prothrombin Time: 26.2 seconds — ABNORMAL HIGH (ref 11.4–15.2)

## 2018-08-08 MED ORDER — PHYTONADIONE 5 MG PO TABS
10.0000 mg | ORAL_TABLET | Freq: Once | ORAL | Status: AC
  Administered 2018-08-08: 10 mg via ORAL
  Filled 2018-08-08: qty 2

## 2018-08-08 MED ORDER — ROSUVASTATIN CALCIUM 5 MG PO TABS
10.0000 mg | ORAL_TABLET | Freq: Every day | ORAL | Status: DC
  Administered 2018-08-08 – 2018-08-09 (×2): 10 mg via ORAL
  Filled 2018-08-08 (×2): qty 2

## 2018-08-08 MED ORDER — SODIUM CHLORIDE 0.9% FLUSH
10.0000 mL | INTRAVENOUS | Status: DC | PRN
  Administered 2018-08-09: 10 mL
  Administered 2018-08-10: 30 mL
  Filled 2018-08-08 (×2): qty 40

## 2018-08-08 NOTE — Progress Notes (Signed)
Progress Note  Patient Name: Michele [name] Date of Encounter: 08/08/2018  Primary Cardiologist: Quay Burow, MD   Subjective   Patient reports some chest pain this am but only with deep breath. Breathing is improved.    Inpatient Medications    Scheduled Meds: . amLODipine  5 mg Oral BID  . ARIPiprazole  5 mg Oral Daily  . aspirin EC  81 mg Oral Daily  . benzonatate  100 mg Oral TID  . feeding supplement (ENSURE ENLIVE)  237 mL Oral Once  . guaiFENesin  600 mg Oral BID  . insulin aspart  0-5 Units Subcutaneous QHS  . insulin aspart  0-9 Units Subcutaneous TID WC  . ipratropium-albuterol  3 mL Nebulization TID  . isosorbide mononitrate  30 mg Oral Daily  . metoprolol tartrate  50 mg Oral BID  . mometasone-formoterol  2 puff Inhalation BID  . pantoprazole  40 mg Oral Daily  . phytonadione  10 mg Oral Once  . sertraline  200 mg Oral Daily  . sodium chloride flush  3 mL Intravenous Q12H   Continuous Infusions: . sodium chloride     PRN Meds: sodium chloride, acetaminophen **OR** acetaminophen, albuterol, gabapentin, hydrALAZINE, menthol-cetylpyridinium, morphine, morphine injection, nitroGLYCERIN, ondansetron (ZOFRAN) IV, sodium chloride flush   Vital Signs    Vitals:   08/07/18 2116 08/07/18 2252 08/08/18 0718 08/08/18 0727  BP: 130/72   131/85  Pulse: 75   68  Resp: 17  14 12   Temp: 97.7 F (36.5 C)   97.6 F (36.4 C)  TempSrc: Oral     SpO2: 98% 98% 97% 98%  Weight:      Height:       No intake or output data in the 24 hours ending 08/08/18 0837 Last 3 Weights 08/01/2018 06/28/2018 06/26/2018  Weight (lbs) 278 lb 283 lb 12.8 oz 283 lb  Weight (kg) 126.1 kg 128.731 kg 128.368 kg  Some encounter information is confidential and restricted. Go to Review Flowsheets activity to see all data.      Telemetry    NSR - Personally Reviewed  ECG    None today  Physical Exam   GEN: Morbidly obese BF in NAD  Neck: no JVD  Cardiac: RRR, no  murmurs, rubs, or gallops.  Respiratory: clear GI: Soft, nontender, non-distended  MS: Triv edema; No deformity. Neuro:  Nonfocal  Psych: Normal affect   Labs    Chemistry Recent Labs  Lab 08/05/18 0308 08/06/18 0328 08/07/18 0221  NA 139 138 136  K 3.9 3.5 3.6  CL 96* 96* 96*  CO2 30 32 31  GLUCOSE 127* 92 98  BUN 16 11 20   CREATININE 0.68 0.67 0.79  CALCIUM 9.6 9.5 9.3  GFRNONAA >60 >60 >60  GFRAA >60 >60 >60  ANIONGAP 13 10 9      Hematology Recent Labs  Lab 08/05/18 0308 08/06/18 0328 08/07/18 0221  WBC 9.8 7.9 8.8  RBC 5.26* 5.24* 5.16*  HGB 10.8* 11.1* 10.6*  HCT 36.7 36.7 36.1  MCV 69.8* 70.0* 70.0*  MCH 20.5* 21.2* 20.5*  MCHC 29.4* 30.2 29.4*  RDW 18.8* 19.0* 18.8*  PLT 297 345 270    Cardiac Enzymes Recent Labs  Lab 08/04/18 1147 08/04/18 1956 08/05/18 0014  TROPONINI <0.03 <0.03 <0.03    Recent Labs  Lab 08/01/18 0838  TROPIPOC 0.01     BNPNo results for input(s): BNP, PROBNP in the last 168 hours.   DDimer No results for input(s): DDIMER  in the last 168 hours.   Radiology    Nm Myocar Multi W/spect W/wall Motion / Ef  Result Date: 08/06/2018  Decreased tracer activity in the inferior, inferolateral, distal lateral and apical wals that inproves in recovery consistent with medium size area of moderate ischemia Note patient's size limits scan some due to soft tissue attenuation.  This is an intermediate risk study.  Nuclear stress EF: 59%.     Cardiac Studies   Echo 09/25/2017 LV EF: 65% -   70%  ------------------------------------------------------------------- Indications:      Chest pain 786.51.  ------------------------------------------------------------------- History:   PMH:   Atrial fibrillation.  Coronary artery disease. PMH:  Sleep Apnea. Acute Renal Failure.  Risk factors: Hypertension. Diabetes mellitus. Dyslipidemia.  ------------------------------------------------------------------- Study  Conclusions  - Left ventricle: The cavity size was normal. Wall thickness was   increased in a pattern of mild LVH. Systolic function was   vigorous. The estimated ejection fraction was in the range of 65%   to 70%. Wall motion was normal; there were no regional wall   motion abnormalities. Doppler parameters are consistent with   abnormal left ventricular relaxation (grade 1 diastolic   dysfunction). - Aortic valve: Poorly visualized. Probably trileaflet. There was   no stenosis. - Mitral valve: There was no significant regurgitation. - Right ventricle: The cavity size was normal. Systolic function   was normal. - Pulmonary arteries: No complete TR doppler jet so unable to   estimate PA systolic pressure. - Inferior vena cava: The vessel was normal in size. The   respirophasic diameter changes were in the normal range (>= 50%),   consistent with normal central venous pressure. - Pericardium, extracardiac: A trivial pericardial effusion was   identified.  Impressions:  - Normal LV size with mild LV hypertrophy. EF 65-70%, vigorous   systolic function. Normal RV size and systolic function. No   significant valvular abnormalities.  Patient Profile     68 y.o. female with a hx of CAD with stent to pRCA 2008, DM-2, HTN, sleep apnea, asthma, PAF, HLD and arthritis who was admitted for wheezing and SOB, she also complained of chest pain  Assessment & Plan    1. Chest pain: Admit EKG showed anterior TWI with subsquent resolution. Serial troponin levels negative. Myovue done yesterday-   Difficult study due to size. Consistent with ischemia in the inferior, inferolateral, apical distributions   Different from prior scans   Cannot exclude some shifting soft tissue artifact. Based on above with changes and symptoms on presentation we would recomm L heart cath to define anatomy and LVEDP. Unfortunately Coumadin was not held and in fact patient received a higher dose of coumadin Sunday. Now   INR increased 1.88>>1.7>>2.02>>2.45. She will not be able to have cath today. Would like INR< 1.8 for procedure. Given recurrent chest pain she should stay in hospital until cath can be done. Will hold coumadin. Give Vit K today.  Will do cath when INR < 1.8. Hopefully tomorrow. Will add Imdur to regimen.   2. CAD s/p stent to [facility] 2008: intermittent chest pain at rest. Per #1.   3. HTN: BP is well controlled.   4. HLD:  Would restart home Crestor    5. DM II: defer to PCP  6. PAF: on coumadin. Maintaining NSR  7. Morbid obesity.   8. COPD exacerbation  Pt on steroids   Improved.        For questions or updates, please contact [place] Please consult  [email or web address] for contact info under        Signed, Malaki Koury [place], MD  08/08/2018, 8:37 AM

## 2018-08-08 NOTE — Progress Notes (Signed)
ANTICOAGULATION CONSULT NOTE - Follow-Up Consult  Pharmacy Consult for Warfarin Indication: atrial fibrillation  Allergies  Allergen Reactions  . Meprobamate Nausea And Vomiting    Patient Measurements: Height: 5\' 2"  (157.5 cm) Weight: 278 lb (126.1 kg) IBW/kg (Calculated) : 50.1   Vital Signs: Temp: 97.6 F (36.4 C) (01/21 0727) BP: 131/85 (01/21 0727) Pulse Rate: 68 (01/21 0727)  Labs: Recent Labs    08/06/18 0328 08/07/18 0221 08/08/18 0237  HGB 11.1* 10.6*  --   HCT 36.7 36.1  --   PLT 345 270  --   LABPROT 19.7* 22.6* 26.2*  INR 1.70 2.02 2.45  CREATININE 0.67 0.79  --     Estimated Creatinine Clearance: 86.7 mL/min (by C-G formula based on SCr of 0.79 mg/dL).   Medical History: Past Medical History:  Diagnosis Date  . Allergic rhinitis   . ARF (acute renal failure) ([place]) 03/2017  . 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 Asthma   . CAD (coronary artery disease) 01/30/2007   stents trivial coronary artery disease diffusely, with a recent deployment od a intracoronary artery stent, 3.5 x 12 mm driver stent with no more than 20-30% in- stents restenosis.done by Dr [name] with re-look on novenber 10 2008 revealing a widely patent stent with otherwise trival CAD and normal LV function  . Carpal tunnel syndrome, right   . Chest pain 07/17/12009   2 D Echo EF >55%  . Chronic lower back pain   . Complication of anesthesia    slow to wake up  . COPD (chronic obstructive pulmonary disease) ([place])   . DDD (degenerative disc disease), lumbar    multilevel lumbar degenerative disc disease  . Depression   . Diabetes mellitus type II   . Gait instability   . GERD (gastroesophageal reflux disease)   . History of kidney stones   . History of stomach ulcers    "years ago" (08/01/2018)  . Hyperlipidemia   . Hypertension   . IBS (irritable bowel syndrome)   . Iron deficiency anemia 07/13/2016  . Obesity   . On home oxygen therapy    "2L 24/7"  (08/01/2018)  . OSA on CPAP    "w/O2" (08/01/2018)  . Paroxysmal A-fib ([place])   . PTSD (post-traumatic stress disorder)   . Stroke [facility])    "not sure when; showed up on one of my tests" (08/01/2018)  . TIA (transient ischemic attack)    "several; they say I have small ones" (08/01/2018)  . Urinary incontinence     Assessment: 68 yo female admitted with a COPD exacerbation. Pharmacy consulted to restart Coumadin for atrial fibrillation.   The patient was seen in the anti-coagulation clinic on 1/13 and was found to have a SUBtherapeutic INR of 1.5 - the patient was instructed to take 15 mg on 1/13 - then resume PTA dosing of 11.25 mg daily EXCEPT for 7.5 mg on MWF.  INR increased slightly from 2.02 to 2.45 today. Warfarin was held last night in order to allow to trend down for cardiac cath (plan once INR<1.8). Hgb 10.6, plt 270 on last check. No s/sx of bleeding. Plan per MD to give vitamin K 10 mg today.     Goal of Therapy:  INR 2-3   Plan:  Hold warfarin therapy  Daily INR Monitor for s/sx of bleeding  Antonietta Jewel, PharmD, BCCCP Clinical Pharmacist  Pager: [phone] Phone: [phone] 08/08/2018 11:11 AM

## 2018-08-08 NOTE — H&P (View-Only) (Signed)
Progress Note  Patient Name: Michele [name] Date of Encounter: 08/08/2018  Primary Cardiologist: Quay Burow, MD   Subjective   Patient reports some chest pain this am but only with deep breath. Breathing is improved.    Inpatient Medications    Scheduled Meds: . amLODipine  5 mg Oral BID  . ARIPiprazole  5 mg Oral Daily  . aspirin EC  81 mg Oral Daily  . benzonatate  100 mg Oral TID  . feeding supplement (ENSURE ENLIVE)  237 mL Oral Once  . guaiFENesin  600 mg Oral BID  . insulin aspart  0-5 Units Subcutaneous QHS  . insulin aspart  0-9 Units Subcutaneous TID WC  . ipratropium-albuterol  3 mL Nebulization TID  . isosorbide mononitrate  30 mg Oral Daily  . metoprolol tartrate  50 mg Oral BID  . mometasone-formoterol  2 puff Inhalation BID  . pantoprazole  40 mg Oral Daily  . phytonadione  10 mg Oral Once  . sertraline  200 mg Oral Daily  . sodium chloride flush  3 mL Intravenous Q12H   Continuous Infusions: . sodium chloride     PRN Meds: sodium chloride, acetaminophen **OR** acetaminophen, albuterol, gabapentin, hydrALAZINE, menthol-cetylpyridinium, morphine, morphine injection, nitroGLYCERIN, ondansetron (ZOFRAN) IV, sodium chloride flush   Vital Signs    Vitals:   08/07/18 2116 08/07/18 2252 08/08/18 0718 08/08/18 0727  BP: 130/72   131/85  Pulse: 75   68  Resp: 17  14 12   Temp: 97.7 F (36.5 C)   97.6 F (36.4 C)  TempSrc: Oral     SpO2: 98% 98% 97% 98%  Weight:      Height:       No intake or output data in the 24 hours ending 08/08/18 0837 Last 3 Weights 08/01/2018 06/28/2018 06/26/2018  Weight (lbs) 278 lb 283 lb 12.8 oz 283 lb  Weight (kg) 126.1 kg 128.731 kg 128.368 kg  Some encounter information is confidential and restricted. Go to Review Flowsheets activity to see all data.      Telemetry    NSR - Personally Reviewed  ECG    None today  Physical Exam   GEN: Morbidly obese BF in NAD  Neck: no JVD  Cardiac: RRR, no  murmurs, rubs, or gallops.  Respiratory: clear GI: Soft, nontender, non-distended  MS: Triv edema; No deformity. Neuro:  Nonfocal  Psych: Normal affect   Labs    Chemistry Recent Labs  Lab 08/05/18 0308 08/06/18 0328 08/07/18 0221  NA 139 138 136  K 3.9 3.5 3.6  CL 96* 96* 96*  CO2 30 32 31  GLUCOSE 127* 92 98  BUN 16 11 20   CREATININE 0.68 0.67 0.79  CALCIUM 9.6 9.5 9.3  GFRNONAA >60 >60 >60  GFRAA >60 >60 >60  ANIONGAP 13 10 9      Hematology Recent Labs  Lab 08/05/18 0308 08/06/18 0328 08/07/18 0221  WBC 9.8 7.9 8.8  RBC 5.26* 5.24* 5.16*  HGB 10.8* 11.1* 10.6*  HCT 36.7 36.7 36.1  MCV 69.8* 70.0* 70.0*  MCH 20.5* 21.2* 20.5*  MCHC 29.4* 30.2 29.4*  RDW 18.8* 19.0* 18.8*  PLT 297 345 270    Cardiac Enzymes Recent Labs  Lab 08/04/18 1147 08/04/18 1956 08/05/18 0014  TROPONINI <0.03 <0.03 <0.03    Recent Labs  Lab 08/01/18 0838  TROPIPOC 0.01     BNPNo results for input(s): BNP, PROBNP in the last 168 hours.   DDimer No results for input(s): DDIMER  in the last 168 hours.   Radiology    Nm Myocar Multi W/spect W/wall Motion / Ef  Result Date: 08/06/2018  Decreased tracer activity in the inferior, inferolateral, distal lateral and apical wals that inproves in recovery consistent with medium size area of moderate ischemia Note patient's size limits scan some due to soft tissue attenuation.  This is an intermediate risk study.  Nuclear stress EF: 59%.     Cardiac Studies   Echo 09/25/2017 LV EF: 65% -   70%  ------------------------------------------------------------------- Indications:      Chest pain 786.51.  ------------------------------------------------------------------- History:   PMH:   Atrial fibrillation.  Coronary artery disease. PMH:  Sleep Apnea. Acute Renal Failure.  Risk factors: Hypertension. Diabetes mellitus. Dyslipidemia.  ------------------------------------------------------------------- Study  Conclusions  - Left ventricle: The cavity size was normal. Wall thickness was   increased in a pattern of mild LVH. Systolic function was   vigorous. The estimated ejection fraction was in the range of 65%   to 70%. Wall motion was normal; there were no regional wall   motion abnormalities. Doppler parameters are consistent with   abnormal left ventricular relaxation (grade 1 diastolic   dysfunction). - Aortic valve: Poorly visualized. Probably trileaflet. There was   no stenosis. - Mitral valve: There was no significant regurgitation. - Right ventricle: The cavity size was normal. Systolic function   was normal. - Pulmonary arteries: No complete TR doppler jet so unable to   estimate PA systolic pressure. - Inferior vena cava: The vessel was normal in size. The   respirophasic diameter changes were in the normal range (>= 50%),   consistent with normal central venous pressure. - Pericardium, extracardiac: A trivial pericardial effusion was   identified.  Impressions:  - Normal LV size with mild LV hypertrophy. EF 65-70%, vigorous   systolic function. Normal RV size and systolic function. No   significant valvular abnormalities.  Patient Profile     68 y.o. female with a hx of CAD with stent to pRCA 2008, DM-2, HTN, sleep apnea, asthma, PAF, HLD and arthritis who was admitted for wheezing and SOB, she also complained of chest pain  Assessment & Plan    1. Chest pain: Admit EKG showed anterior TWI with subsquent resolution. Serial troponin levels negative. Myovue done yesterday-   Difficult study due to size. Consistent with ischemia in the inferior, inferolateral, apical distributions   Different from prior scans   Cannot exclude some shifting soft tissue artifact. Based on above with changes and symptoms on presentation we would recomm L heart cath to define anatomy and LVEDP. Unfortunately Coumadin was not held and in fact patient received a higher dose of coumadin Sunday. Now   INR increased 1.88>>1.7>>2.02>>2.45. She will not be able to have cath today. Would like INR< 1.8 for procedure. Given recurrent chest pain she should stay in hospital until cath can be done. Will hold coumadin. Give Vit K today.  Will do cath when INR < 1.8. Hopefully tomorrow. Will add Imdur to regimen.   2. CAD s/p stent to [facility] 2008: intermittent chest pain at rest. Per #1.   3. HTN: BP is well controlled.   4. HLD:  Would restart home Crestor    5. DM II: defer to PCP  6. PAF: on coumadin. Maintaining NSR  7. Morbid obesity.   8. COPD exacerbation  Pt on steroids   Improved.        For questions or updates, please contact [place] Please consult  [email or web address] for contact info under        Signed, Rowan Pollman [place], MD  08/08/2018, 8:37 AM

## 2018-08-08 NOTE — Interval H&P Note (Signed)
History and Physical Interval Note: Cath Lab Visit (complete for each Cath Lab visit)  Clinical Evaluation Leading to the Procedure:   ACS: Yes.    Non-ACS:    Anginal Classification: CCS IV  Anti-ischemic medical therapy: Minimal Therapy (1 class of medications)  Non-Invasive Test Results: Intermediate-risk stress test findings: cardiac mortality 1-3%/year  Prior CABG: No previous CABG       08/08/2018 3:20 PM  [place]onday  has presented today for surgery, with the diagnosis of unstable angina  The various methods of treatment have been discussed with the patient and family. After consideration of risks, benefits and other options for treatment, the patient has consented to  Procedure(s): LEFT HEART CATH AND CORONARY ANGIOGRAPHY (N/A) as a surgical intervention .  The patient's history has been reviewed, patient examined, no change in status, stable for surgery.  I have reviewed the patient's chart and labs.  Questions were answered to the patient's satisfaction.     [name]

## 2018-08-08 NOTE — Telephone Encounter (Signed)
Patient is not currently admitted. Will route to CY to see if it is ok to close encounter.

## 2018-08-08 NOTE — Care Management Important Message (Signed)
Important Message  Patient Details  Name: Michele [name] MRN: [id number] Date of Birth: Aug 18, 1950   Medicare Important Message Given:  Yes    Kayte Borchard 08/08/2018, 2:21 PM

## 2018-08-08 NOTE — Progress Notes (Signed)
[facility] Progress Note  Patient: Michele [name] [facility] ATF:[id number]   PCP: [name], MD DOB: 1950-11-17   DOA: 08/01/2018   DOS: 08/08/2018   Date of Service: the patient was seen and examined on 08/08/2018  Brief hospital course: Pt. with PMH of sleep apnea noncompliant to CPAP, paroxysmal A. 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 Currently further plan is continue current care.  Subjective: No further chest pain, feeling better.  No nausea no vomiting.  Breathing better.  Telemetry: Normal sinus rhythm.  Assessment and Plan: Acute on chronic respiratory failure with hypoxia due to COPD exacerbation: Initially needed BiPAP Now on oxygen, home regimen Treated with antibiotics. Continue bronchodilator therapy with scheduled and as needed nebulizers, inhalational steroids, oral steroids.  Obstructive sleep apnea, noncompliant to CPAP.  discussed with patient and family to be compliant Continue CPAP at night  Type 2 diabetes: Uncontrolled with hyperglycemia. Secondary to steroids. On metformin at home.  Suspect exacerbation with use of steroids.  Will use insulin coverage.  Paroxysmal A. fib:  Currently sinus rhythm.  Rate controlled.  Resume metoprolol. Hold Coumadin for now for cath  Hypertension: Stable.  Adjusting home medication due to uncontrolled hypertension  Anemia appears chronic. Initial acute drop appears to be dilutional in nature. Repeat H&H for last 3 days have remained stable. No acute bleeding here in the hospital.  Iron studies are also unremarkable.  CAD Chest pain. Patient does have significant coronary disease. Her pain  while atypical in nature is not reproducible. EKG shows some T wave inversions in anterolateral leads as well as subtle changes in the inferior leads. New symptoms were present on admission as well but not present on prior EKG. Troponin is negative We will add aspirin. Appreciate cardiology assistance. 2-day stress test secondary to BMI, finding suggest ischemia in the inferior, inferolateral, apical distributions   Different from prior scans   Cannot exclude some shifting soft tissue and therefore they would like to perform a cardiac catheterization. Tentatively scheduled for Wednesday secondary to elevated INR.  Morbid obesity. Body mass index is 50.85 kg/m.  Dietary consultation.  Diet: cardiac diet DVT Prophylaxis: subcutaneous Heparin  Advance goals of care discussion: full code  Family Communication: family was present at bedside, at the time of interview.   Disposition:  Discharge to home cath negative on 08/09/2018.  Consultants: Cardiology Procedures: Nuclear medicine stress test  Scheduled Meds: . amLODipine  5 mg Oral BID  . ARIPiprazole  5 mg Oral Daily  . aspirin EC  81 mg Oral Daily  . benzonatate  100 mg Oral TID  . feeding supplement (ENSURE ENLIVE)  237 mL Oral Once  . guaiFENesin  600 mg Oral BID  . insulin aspart  0-5 Units Subcutaneous QHS  . insulin aspart  0-9 Units Subcutaneous TID WC  . ipratropium-albuterol  3 mL Nebulization TID  . isosorbide mononitrate  30 mg Oral Daily  . metoprolol tartrate  50 mg Oral BID  . mometasone-formoterol  2 puff Inhalation BID  . pantoprazole  40 mg Oral Daily  . rosuvastatin  10 mg Oral q1800  . sertraline  200 mg Oral Daily  . sodium chloride flush  3 mL Intravenous Q12H   Continuous Infusions: .  sodium chloride     PRN Meds: sodium chloride, acetaminophen **OR** acetaminophen, albuterol, gabapentin, hydrALAZINE, menthol-cetylpyridinium, morphine, morphine injection, nitroGLYCERIN, ondansetron (ZOFRAN) IV,  sodium chloride flush Antibiotics: Anti-infectives (From admission, onward)   Start     Dose/Rate Route Frequency Ordered Stop   08/03/18 1000  cefdinir (OMNICEF) capsule 300 mg     300 mg Oral Every 12 hours 08/03/18 0752 08/05/18 2153   08/03/18 1000  azithromycin (ZITHROMAX) tablet 500 mg  Status:  Discontinued     500 mg Oral Daily 08/03/18 0752 08/04/18 1324   08/01/18 1700  cefTRIAXone (ROCEPHIN) 1 g in sodium chloride 0.9 % 100 mL IVPB  Status:  Discontinued     1 g 200 mL/hr over 30 Minutes Intravenous Every 24 hours 08/01/18 1601 08/03/18 0752       Objective: Physical Exam: Vitals:   08/08/18 0718 08/08/18 0727 08/08/18 1400 08/08/18 1600  BP:  131/85  117/70  Pulse:  68  72  Resp: 14 12 17 12   Temp:  97.6 F (36.4 C)  97.9 F (36.6 C)  TempSrc:  Oral  Oral  SpO2: 97% 98% 97% 100%  Weight:      Height:       No intake or output data in the 24 hours ending 08/08/18 1944 Filed Weights   08/01/18 1900  Weight: 126.1 kg   General: Alert, Awake and Oriented to Time, Place and Person. Appear in moderate distress, affect appropriate Eyes: PERRL, Conjunctiva normal ENT: Oral Mucosa clear moist. Neck: no JVD, no Abnormal Mass Or lumps Cardiovascular: S1 and S2 Present, no Murmur, Peripheral Pulses Present Respiratory: increased respiratory effort, Bilateral Air entry equal and Decreased, no use of accessory muscle, bilateral  Crackles, bilateral  wheezes Abdomen: Bowel Sound present, Soft and no tenderness, no hernia Skin: no redness, no Rash, non induration Extremities: no Pedal edema, no calf tenderness Neurologic: Grossly no focal neuro deficit. Bilaterally Equal motor strength  Data Reviewed: CBC: Recent Labs  Lab 08/03/18 0453 08/04/18 0208 08/05/18 0308 08/06/18 0328 08/07/18 0221  WBC 10.3 8.7 9.8 7.9 8.8  HGB 9.8* 9.9* 10.8* 11.1* 10.6*  HCT 32.7* 33.2* 36.7 36.7 36.1  MCV 71.9* 71.2* 69.8* 70.0* 70.0*  PLT 259 270 297 345 616   Basic Metabolic  Panel: Recent Labs  Lab 08/03/18 0453 08/04/18 0208 08/05/18 0308 08/06/18 0328 08/07/18 0221  NA 139 141 139 138 136  K 4.4 4.5 3.9 3.5 3.6  CL 100 101 96* 96* 96*  CO2 34* 32 30 32 31  GLUCOSE 99 101* 127* 92 98  BUN 17 20 16 11 20   CREATININE 0.85 0.78 0.68 0.67 0.79  CALCIUM 9.2 9.2 9.6 9.5 9.3  MG 2.2  --   --   --   --     Liver Function Tests: No results for input(s): AST, ALT, ALKPHOS, BILITOT, PROT, ALBUMIN in the last 168 hours. No results for input(s): LIPASE, AMYLASE in the last 168 hours. No results for input(s): AMMONIA in the last 168 hours. Coagulation Profile: Recent Labs  Lab 08/04/18 0208 08/05/18 0308 08/06/18 0328 08/07/18 0221 08/08/18 0237  INR 2.10 1.88 1.70 2.02 2.45   Cardiac Enzymes: Recent Labs  Lab 08/04/18 1147 08/04/18 1956 08/05/18 0014  TROPONINI <0.03 <0.03 <0.03   BNP (last 3 results) No results for input(s): PROBNP in the last 8760 hours. CBG: Recent Labs  Lab 08/07/18 1635 08/07/18 2149 08/08/18 0726 08/08/18 1200 08/08/18 1709  GLUCAP 156* 117* 89 78 78  Studies: No results found.   Time spent: 35 minutes  Author: Berle Mull, MD [facility] 08/08/2018 7:44 PM  Between 7PM-7AM, please contact night-coverage at [email or web address]

## 2018-08-08 NOTE — Progress Notes (Signed)
Physical Therapy Treatment Patient Details Name: Michele [name] MRN: [id number] DOB: 1950-12-21 Today's Date: 08/08/2018    History of Present Illness Michele [name] is a 68 y.o. female with medical history significant of sleep apnea noncompliant to CPAP, paroxysmal A. 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 She has been experiencing ongoing shortness of breath and wheezing for about a month. Patient was admitted 1/14 with COPD exacerbation.     PT Comments    Pt is up to walk with supplemental O2 at 3L due to having room on 2.5 L.  Her efforts were good, maintained O2 sats at 90% or greater with careful pacing.  Pt is expecting to go home when released from hosp and is quite good with RW but will need to use O2 along with her walking.  Will focus on LE strength, ROM and balance to make use of walker and O2 safer and more manageable at home.   Follow Up Recommendations  Home health PT;Supervision/Assistance - 24 hour     Equipment Recommendations  Other (comment)(rollator vs RW)    Recommendations for Other Services       Precautions / Restrictions Precautions Precautions: Fall Precaution Comments: using RW with assist Restrictions Weight Bearing Restrictions: No    Mobility  Bed Mobility               General bed mobility comments: up in chair when PT arrived  Transfers Overall transfer level: Needs assistance Equipment used: Rolling walker (2 wheeled) Transfers: Sit to/from Stand Sit to Stand: Supervision            Ambulation/Gait Ambulation/Gait assistance: Min guard Gait Distance (Feet): 150 Feet Assistive device: Rolling walker (2 wheeled) Gait Pattern/deviations: Step-to pattern;Step-through pattern;Wide base of support;Decreased stride length Gait velocity:  decreased Gait velocity interpretation: <1.31 ft/sec, indicative of household ambulator General Gait Details: hallway on 3L O2, sats were 90% at the lowest but 93% baseline   Stairs             Wheelchair Mobility    Modified Rankin (Stroke Patients Only)       Balance Overall balance assessment: Mild deficits observed, not formally tested                                          Cognition Arousal/Alertness: Awake/alert Behavior During Therapy: WFL for tasks assessed/performed Overall Cognitive Status: Within Functional Limits for tasks assessed                                        Exercises General Exercises - Lower Extremity Ankle Circles/Pumps: AROM;Both;5 reps Quad Sets: AROM;Both;10 reps    General Comments        Pertinent Vitals/Pain Pain Assessment: No/denies pain    Home Living                      Prior Function            PT Goals (current goals can now be found in the care plan section) Progress towards PT goals: Progressing toward goals    Frequency    Min 3X/week  PT Plan Current plan remains appropriate    Co-evaluation              AM-PAC PT "6 Clicks" Mobility   Outcome Measure  Help needed turning from your back to your side while in a flat bed without using bedrails?: None Help needed moving from lying on your back to sitting on the side of a flat bed without using bedrails?: None Help needed moving to and from a bed to a chair (including a wheelchair)?: None Help needed standing up from a chair using your arms (e.g., wheelchair or bedside chair)?: None Help needed to walk in hospital room?: A Little Help needed climbing 3-5 steps with a railing? : A Little 6 Click Score: 22    End of Session Equipment Utilized During Treatment: Gait belt;Oxygen Activity Tolerance: Patient limited by fatigue Patient left: in chair;with call bell/phone within reach;with family/visitor  present Nurse Communication: Mobility status PT Visit Diagnosis: Unsteadiness on feet (R26.81);Difficulty in walking, not elsewhere classified (R26.2);Other abnormalities of gait and mobility (R26.89)     Time: 3300-7622 PT Time Calculation (min) (ACUTE ONLY): 26 min  Charges:  $Gait Training: 8-22 mins $Therapeutic Exercise: 8-22 mins                   Ramond Dial 08/08/2018, 4:04 PM  Mee Hives, PT MS Acute Rehab Dept. Number: [place] and [place]

## 2018-08-09 ENCOUNTER — Inpatient Hospital Stay (HOSPITAL_COMMUNITY)
Admission: EM | Disposition: A | Discharge status: Home Health | Payer: Self-pay | Source: Home / Self Care | Attending: Internal Medicine

## 2018-08-09 ENCOUNTER — Encounter (HOSPITAL_COMMUNITY): Payer: Self-pay | Admitting: Interventional Cardiology

## 2018-08-09 DIAGNOSIS — Z794 Long term (current) use of insulin: Secondary | ICD-10-CM

## 2018-08-09 DIAGNOSIS — Z6841 Body Mass Index (BMI) 40.0 and over, adult: Secondary | ICD-10-CM

## 2018-08-09 DIAGNOSIS — E11620 Type 2 diabetes mellitus with diabetic dermatitis: Secondary | ICD-10-CM

## 2018-08-09 DIAGNOSIS — R7401 Elevation of levels of liver transaminase levels: Secondary | ICD-10-CM

## 2018-08-09 DIAGNOSIS — D509 Iron deficiency anemia, unspecified: Secondary | ICD-10-CM

## 2018-08-09 DIAGNOSIS — R740 Nonspecific elevation of levels of transaminase and lactic acid dehydrogenase [LDH]: Secondary | ICD-10-CM

## 2018-08-09 DIAGNOSIS — I25118 Atherosclerotic heart disease of native coronary artery with other forms of angina pectoris: Secondary | ICD-10-CM

## 2018-08-09 HISTORY — PX: LEFT HEART CATH AND CORONARY ANGIOGRAPHY: CATH118249

## 2018-08-09 LAB — MAGNESIUM: Magnesium: 2.1 mg/dL (ref 1.7–2.4)

## 2018-08-09 LAB — COMPREHENSIVE METABOLIC PANEL
ALT: 192 U/L — ABNORMAL HIGH (ref 0–44)
AST: 57 U/L — ABNORMAL HIGH (ref 15–41)
Albumin: 3.3 g/dL — ABNORMAL LOW (ref 3.5–5.0)
Alkaline Phosphatase: 133 U/L — ABNORMAL HIGH (ref 38–126)
Anion gap: 6 (ref 5–15)
BILIRUBIN TOTAL: 0.8 mg/dL (ref 0.3–1.2)
BUN: 16 mg/dL (ref 8–23)
CO2: 30 mmol/L (ref 22–32)
Calcium: 9.2 mg/dL (ref 8.9–10.3)
Chloride: 103 mmol/L (ref 98–111)
Creatinine, Ser: 0.71 mg/dL (ref 0.44–1.00)
GFR calc Af Amer: >60 mL/min (ref >60)
GFR calc non Af Amer: >60 mL/min (ref >60)
Glucose, Bld: 97 mg/dL (ref 70–99)
Potassium: 4.2 mmol/L (ref 3.5–5.1)
Sodium: 139 mmol/L (ref 135–145)
Total Protein: 7 g/dL (ref 6.5–8.1)

## 2018-08-09 LAB — CBC WITH DIFFERENTIAL/PLATELET
Abs Immature Granulocytes: 0.03 10*3/uL (ref 0.00–0.07)
Basophils Absolute: 0.1 10*3/uL (ref 0.0–0.1)
Basophils Relative: 1 %
Eosinophils Absolute: 0.2 10*3/uL (ref 0.0–0.5)
Eosinophils Relative: 3 %
HEMATOCRIT: 33.4 % — AB (ref 36.0–46.0)
HEMOGLOBIN: 10 g/dL — AB (ref 12.0–15.0)
Immature Granulocytes: 0 %
LYMPHS ABS: 2.7 10*3/uL (ref 0.7–4.0)
Lymphocytes Relative: 35 %
MCH: 21.1 pg — ABNORMAL LOW (ref 26.0–34.0)
MCHC: 29.9 g/dL — ABNORMAL LOW (ref 30.0–36.0)
MCV: 70.5 fL — ABNORMAL LOW (ref 80.0–100.0)
Monocytes Absolute: 0.7 10*3/uL (ref 0.1–1.0)
Monocytes Relative: 9 %
NRBC: 0 % (ref 0.0–0.2)
Neutro Abs: 3.9 10*3/uL (ref 1.7–7.7)
Neutrophils Relative %: 52 %
Platelets: 240 10*3/uL (ref 150–400)
RBC: 4.74 MIL/uL (ref 3.87–5.11)
RDW: 18.8 % — AB (ref 11.5–15.5)
WBC: 7.6 10*3/uL (ref 4.0–10.5)

## 2018-08-09 LAB — PROTIME-INR
INR: 1.23
Prothrombin Time: 15.4 seconds — ABNORMAL HIGH (ref 11.4–15.2)

## 2018-08-09 LAB — GLUCOSE, CAPILLARY
GLUCOSE-CAPILLARY: 100 mg/dL — AB (ref 70–99)
Glucose-Capillary: 77 mg/dL (ref 70–99)
Glucose-Capillary: 96 mg/dL (ref 70–99)
Glucose-Capillary: 137 mg/dL — ABNORMAL HIGH (ref 70–99)

## 2018-08-09 SURGERY — LEFT HEART CATH AND CORONARY ANGIOGRAPHY
Anesthesia: LOCAL

## 2018-08-09 MED ORDER — OXYCODONE HCL 5 MG PO TABS
5.0000 mg | ORAL_TABLET | ORAL | Status: DC | PRN
  Administered 2018-08-10: 5 mg via ORAL
  Filled 2018-08-09: qty 1

## 2018-08-09 MED ORDER — HEPARIN (PORCINE) IN NACL 1000-0.9 UT/500ML-% IV SOLN
INTRAVENOUS | Status: AC
  Filled 2018-08-09: qty 1500

## 2018-08-09 MED ORDER — LORAZEPAM 0.5 MG PO TABS
0.2500 mg | ORAL_TABLET | Freq: Once | ORAL | Status: AC
  Administered 2018-08-09: 0.25 mg via ORAL
  Filled 2018-08-09: qty 1

## 2018-08-09 MED ORDER — ONDANSETRON HCL 4 MG/2ML IJ SOLN: 4.0000 mg | Freq: Four times a day (QID) | INTRAMUSCULAR | Status: DC | PRN

## 2018-08-09 MED ORDER — SODIUM CHLORIDE 0.9% FLUSH
3.0000 mL | Freq: Two times a day (BID) | INTRAVENOUS | Status: DC
  Administered 2018-08-09: 18:00:00 3 mL via INTRAVENOUS
  Administered 2018-08-10: 10 mL via INTRAVENOUS

## 2018-08-09 MED ORDER — NITROGLYCERIN 0.4 MG SL SUBL
SUBLINGUAL_TABLET | SUBLINGUAL | Status: AC
  Filled 2018-08-09: qty 2

## 2018-08-09 MED ORDER — VERAPAMIL HCL 2.5 MG/ML IV SOLN
INTRAVENOUS | Status: DC | PRN
  Administered 2018-08-09: 10 mL via INTRA_ARTERIAL

## 2018-08-09 MED ORDER — HEPARIN (PORCINE) IN NACL 1000-0.9 UT/500ML-% IV SOLN
INTRAVENOUS | Status: DC | PRN
  Administered 2018-08-09 (×3): 500 mL

## 2018-08-09 MED ORDER — IOHEXOL 350 MG/ML SOLN
INTRAVENOUS | Status: DC | PRN
  Administered 2018-08-09: 90 mL via INTRA_ARTERIAL

## 2018-08-09 MED ORDER — VERAPAMIL HCL 2.5 MG/ML IV SOLN
INTRAVENOUS | Status: AC
  Filled 2018-08-09: qty 2

## 2018-08-09 MED ORDER — WARFARIN - PHARMACIST DOSING INPATIENT
Freq: Every day | Status: DC
  Administered 2018-08-09: 18:00:00

## 2018-08-09 MED ORDER — FENTANYL CITRATE (PF) 100 MCG/2ML IJ SOLN
INTRAMUSCULAR | Status: AC
  Filled 2018-08-09: qty 2

## 2018-08-09 MED ORDER — HEPARIN SODIUM (PORCINE) 1000 UNIT/ML IJ SOLN
INTRAMUSCULAR | Status: AC
  Filled 2018-08-09: qty 1

## 2018-08-09 MED ORDER — LIDOCAINE HCL (PF) 1 % IJ SOLN
INTRAMUSCULAR | Status: DC | PRN
  Administered 2018-08-09: 2 mL
  Administered 2018-08-09: 15 mL

## 2018-08-09 MED ORDER — WARFARIN SODIUM 7.5 MG PO TABS
15.0000 mg | ORAL_TABLET | Freq: Once | ORAL | Status: AC
  Administered 2018-08-09: 18:00:00 15 mg via ORAL
  Filled 2018-08-09: qty 2

## 2018-08-09 MED ORDER — SODIUM CHLORIDE 0.9% FLUSH: 3.0000 mL | INTRAVENOUS | Status: DC | PRN

## 2018-08-09 MED ORDER — LIDOCAINE HCL (PF) 1 % IJ SOLN
INTRAMUSCULAR | Status: AC
  Filled 2018-08-09: qty 30

## 2018-08-09 MED ORDER — FENTANYL CITRATE (PF) 100 MCG/2ML IJ SOLN
INTRAMUSCULAR | Status: DC | PRN
  Administered 2018-08-09 (×3): 25 ug via INTRAVENOUS

## 2018-08-09 MED ORDER — ACETAMINOPHEN 325 MG PO TABS: 650.0000 mg | ORAL_TABLET | ORAL | Status: DC | PRN

## 2018-08-09 MED ORDER — MIDAZOLAM HCL 2 MG/2ML IJ SOLN
INTRAMUSCULAR | Status: DC | PRN
  Administered 2018-08-09: 0.5 mg via INTRAVENOUS

## 2018-08-09 MED ORDER — MIDAZOLAM HCL 2 MG/2ML IJ SOLN
INTRAMUSCULAR | Status: AC
  Filled 2018-08-09: qty 2

## 2018-08-09 MED ORDER — HEPARIN SODIUM (PORCINE) 5000 UNIT/ML IJ SOLN: 5000.0000 [IU] | Freq: Three times a day (TID) | INTRAMUSCULAR | Status: DC

## 2018-08-09 MED ORDER — NITROGLYCERIN 0.4 MG SL SUBL
SUBLINGUAL_TABLET | SUBLINGUAL | Status: DC | PRN
  Administered 2018-08-09 (×2): .4 mg via SUBLINGUAL

## 2018-08-09 MED ORDER — SODIUM CHLORIDE 0.9 % IV SOLN: 250.0000 mL | INTRAVENOUS | Status: DC | PRN

## 2018-08-09 MED ORDER — SODIUM CHLORIDE 0.9 % IV SOLN: INTRAVENOUS | Status: AC

## 2018-08-09 MED ORDER — HEPARIN SODIUM (PORCINE) 5000 UNIT/ML IJ SOLN
5000.0000 [IU] | Freq: Three times a day (TID) | INTRAMUSCULAR | Status: DC
  Administered 2018-08-10: 5000 [IU] via SUBCUTANEOUS
  Filled 2018-08-09: qty 1

## 2018-08-09 SURGICAL SUPPLY — 14 items
CATH 5FR JL3.5 JR4 ANG PIG MP (CATHETERS) ×2 IMPLANT
CLOSURE MYNX CONTROL 5F (Vascular Products) ×2 IMPLANT
DEVICE RAD COMP TR BAND LRG (VASCULAR PRODUCTS) ×2 IMPLANT
GLIDESHEATH SLEND A-KIT 6F 22G (SHEATH) ×2 IMPLANT
GUIDEWIRE INQWIRE 1.5J.035X260 (WIRE) ×1 IMPLANT
HOVERMATT SINGLE USE (MISCELLANEOUS) ×2 IMPLANT
INQWIRE 1.5J .035X260CM (WIRE) ×2
KIT HEART LEFT (KITS) ×2 IMPLANT
PACK CARDIAC CATHETERIZATION (CUSTOM PROCEDURE TRAY) ×2 IMPLANT
SHEATH PINNACLE 5F 10CM (SHEATH) ×2 IMPLANT
SHEATH PROBE COVER 6X72 (BAG) ×2 IMPLANT
TRANSDUCER W/STOPCOCK (MISCELLANEOUS) ×2 IMPLANT
TUBING CIL FLEX 10 FLL-RA (TUBING) ×2 IMPLANT
WIRE EMERALD 3MM-J .035X150CM (WIRE) ×2 IMPLANT

## 2018-08-09 NOTE — Progress Notes (Signed)
Progress Note  Patient Name: [name] Date of Encounter: 08/09/2018  Primary Cardiologist: Quay Burow, MD   Subjective   Patient reports some chest pain this am but only with deep breath. Breathing is improved.    Inpatient Medications    Scheduled Meds: . amLODipine  5 mg Oral BID  . ARIPiprazole  5 mg Oral Daily  . aspirin EC  81 mg Oral Daily  . benzonatate  100 mg Oral TID  . feeding supplement (ENSURE ENLIVE)  237 mL Oral Once  . guaiFENesin  600 mg Oral BID  . insulin aspart  0-5 Units Subcutaneous QHS  . insulin aspart  0-9 Units Subcutaneous TID WC  . ipratropium-albuterol  3 mL Nebulization TID  . isosorbide mononitrate  30 mg Oral Daily  . metoprolol tartrate  50 mg Oral BID  . mometasone-formoterol  2 puff Inhalation BID  . pantoprazole  40 mg Oral Daily  . rosuvastatin  10 mg Oral q1800  . sertraline  200 mg Oral Daily  . sodium chloride flush  3 mL Intravenous Q12H   Continuous Infusions: . sodium chloride     PRN Meds: sodium chloride, acetaminophen **OR** acetaminophen, albuterol, gabapentin, hydrALAZINE, menthol-cetylpyridinium, morphine, morphine injection, nitroGLYCERIN, ondansetron (ZOFRAN) IV, sodium chloride flush, sodium chloride flush   Vital Signs    Vitals:   08/08/18 2207 08/08/18 2350 08/09/18 0720 08/09/18 0751  BP: 114/60   137/70  Pulse: 74 64  66  Resp: 20 14 12    Temp: 98.2 F (36.8 C)   98.2 F (36.8 C)  TempSrc:    Oral  SpO2: 97% 99% 97% 98%  Weight:      Height:        Intake/Output Summary (Last 24 hours) at 08/09/2018 1006 Last data filed at 08/09/2018 0933 Gross per 24 hour  Intake 3 ml  Output 400 ml  Net -397 ml   Last 3 Weights 08/01/2018 06/28/2018 06/26/2018  Weight (lbs) 278 lb 283 lb 12.8 oz 283 lb  Weight (kg) 126.1 kg 128.731 kg 128.368 kg  Some encounter information is confidential and restricted. Go to Review Flowsheets activity to see all data.      Telemetry    NSR -  Personally Reviewed  ECG    None today  Physical Exam   GEN: Morbidly obese BF in NAD  Neck: no JVD  Cardiac: RRR, no murmurs, rubs, or gallops.  Respiratory: clear GI: Soft, nontender, non-distended  MS: Triv edema; No deformity. Neuro:  Nonfocal  Psych: Normal affect   Labs    Chemistry Recent Labs  Lab 08/06/18 0328 08/07/18 0221 08/09/18 0744  NA 138 136 139  K 3.5 3.6 4.2  CL 96* 96* 103  CO2 32 31 30  GLUCOSE 92 98 97  BUN 11 20 16   CREATININE 0.67 0.79 0.71  CALCIUM 9.5 9.3 9.2  PROT  --   --  7.0  ALBUMIN  --   --  3.3*  AST  --   --  57*  ALT  --   --  192*  ALKPHOS  --   --  133*  BILITOT  --   --  0.8  GFRNONAA >60 >60 >60  GFRAA >60 >60 >60  ANIONGAP 10 9 6      Hematology Recent Labs  Lab 08/06/18 0328 08/07/18 0221 08/09/18 0744  WBC 7.9 8.8 7.6  RBC 5.24* 5.16* 4.74  HGB 11.1* 10.6* 10.0*  HCT 36.7 36.1 33.4*  MCV  70.0* 70.0* 70.5*  MCH 21.2* 20.5* 21.1*  MCHC 30.2 29.4* 29.9*  RDW 19.0* 18.8* 18.8*  PLT 345 270 240    Cardiac Enzymes Recent Labs  Lab 08/04/18 1147 08/04/18 1956 08/05/18 0014  TROPONINI <0.03 <0.03 <0.03    No results for input(s): TROPIPOC in the last 168 hours.   BNPNo results for input(s): BNP, PROBNP in the last 168 hours.   DDimer No results for input(s): DDIMER in the last 168 hours.   Radiology    No results found.  Cardiac Studies   Echo 09/25/2017 LV EF: 65% -   70%  ------------------------------------------------------------------- Indications:      Chest pain 786.51.  ------------------------------------------------------------------- History:   PMH:   Atrial fibrillation.  Coronary artery disease. PMH:  Sleep Apnea. Acute Renal Failure.  Risk factors: Hypertension. Diabetes mellitus. Dyslipidemia.  ------------------------------------------------------------------- Study Conclusions  - Left ventricle: The cavity size was normal. Wall thickness was   increased in a pattern of  mild LVH. Systolic function was   vigorous. The estimated ejection fraction was in the range of 65%   to 70%. Wall motion was normal; there were no regional wall   motion abnormalities. Doppler parameters are consistent with   abnormal left ventricular relaxation (grade 1 diastolic   dysfunction). - Aortic valve: Poorly visualized. Probably trileaflet. There was   no stenosis. - Mitral valve: There was no significant regurgitation. - Right ventricle: The cavity size was normal. Systolic function   was normal. - Pulmonary arteries: No complete TR doppler jet so unable to   estimate PA systolic pressure. - Inferior vena cava: The vessel was normal in size. The   respirophasic diameter changes were in the normal range (>= 50%),   consistent with normal central venous pressure. - Pericardium, extracardiac: A trivial pericardial effusion was   identified.  Impressions:  - Normal LV size with mild LV hypertrophy. EF 65-70%, vigorous   systolic function. Normal RV size and systolic function. No   significant valvular abnormalities.  Patient Profile     68 y.o. female with a hx of CAD with stent to pRCA 2008, DM-2, HTN, sleep apnea, asthma, PAF, HLD and arthritis who was admitted for wheezing and SOB, she also complained of chest pain  Assessment & Plan    1. Chest pain: Admit EKG showed anterior TWI with subsquent resolution. Serial troponin levels negative. Myovue done yesterday-   Difficult study due to size. Consistent with ischemia in the inferior, inferolateral, apical distributions   Different from prior scans   Cannot exclude some shifting soft tissue artifact. Based on above with changes and symptoms on presentation we would recomm L heart cath to define anatomy and LVEDP. Unfortunately Coumadin was not held and in fact patient received a higher dose of coumadin Sunday. Now  INR increased 1.88>>1.7>>2.02>>2.45. given Vit K yesterday and INR down to 1.23.  She will  be able to have  cath today. Further plans depending on results.   2. CAD s/p stent to [facility] 2008: intermittent chest pain at rest. Per #1.   3. HTN: BP is well controlled.   4. HLD:  Would restart home Crestor    5. DM II: defer to PCP  6. PAF: on coumadin. Maintaining NSR. Will resume coumadin post cath.   7. Morbid obesity.   8. COPD exacerbation  Pt on steroids   Improved.   9. Elevated LFTs. Unclear etiology. Has been on crestor long term and LFTs were normal early in the month.  Will defer to primary team.        For questions or updates, please contact [place] Please consult [email or web address] for contact info under        Signed, Jecenia Leamer [place], MD  08/09/2018, 10:06 AM

## 2018-08-09 NOTE — Interval H&P Note (Signed)
Cath Lab Visit (complete for each Cath Lab visit)  Clinical Evaluation Leading to the Procedure:   ACS: No.  Non-ACS:    Anginal Classification: CCS [name]  Anti-ischemic medical therapy: Maximal Therapy (2 or more classes of medications)  Non-Invasive Test Results: Intermediate-risk stress test findings: cardiac mortality 1-3%/year  Prior CABG: No previous CABG      History and Physical Interval Note:  08/09/2018 11:36 AM  Michele [name]  has presented today for surgery, with the diagnosis of unstable angina  The various methods of treatment have been discussed with the patient and family. After consideration of risks, benefits and other options for treatment, the patient has consented to  Procedure(s): LEFT HEART CATH AND CORONARY ANGIOGRAPHY (N/A) as a surgical intervention .  The patient's history has been reviewed, patient examined, no change in status, stable for surgery.  I have reviewed the patient's chart and labs.  Questions were answered to the patient's satisfaction.     Michele [name]

## 2018-08-09 NOTE — CV Procedure (Signed)
   Right radial approach with vascular ultrasound guidance.  Unable to complete procedure due to tortuosity and what appears to be a bovine arch, producing angulation that prevented catheter torque.  Ultrasound-guided right femoral artery access at the level of the lower third of the femoral head above the bifurcation.  Dominant right coronary with proximal to mid segmental stenosis up to 85% with the most severe area being in-stent restenosis within a 3.5 x 12 mm driver bare-metal stent placed in 2008.  Proximal to the stenosed area and distal the vessel is very ectatic and measures greater than 5.5 mm in diameter.  Mid vessel 70% narrowing and distal 70% eccentric narrowing.  Ectatic left main without obstruction.  LAD ectatic with slow flow and no significant fixed obstruction until the apical segment where there is 80% narrowing.  The circumflex gives origin to a high lateral bifurcating first obtuse marginal which is ectatic/aneurysmal, containing eccentric 50% proximal narrowing.  Relatively slow flow is noted.  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  The continuation of the circumflex beyond the first marginal is also ectatic.  Low normal LVEF of 50%.  LVEDP 23 mmHg.  Chest discomfort was present post procedure described as tightness that gradually improved with sublingual nitroglycerin x2 and fentanyl 50 mcg IV.  Overall, after review of coronary anatomy, my recommendation would be medical therapy.  Reviewed prior images from 13 years ago and there was difficulty getting distal in the right coronary and the stent could not be advanced much beyond where it was deployed in the proximal vessel.

## 2018-08-09 NOTE — Progress Notes (Signed)
CPAP set up at bedside for pt use. Pt to notify for RT when she is ready for bed. RT will continue to monitor.

## 2018-08-09 NOTE — H&P (View-Only) (Signed)
Progress Note  Patient Name: Michele [name] [facility] Date of Encounter: 08/09/2018  Primary Cardiologist: Quay Burow, MD   Subjective   Patient reports some chest pain this am but only with deep breath. Breathing is improved.    Inpatient Medications    Scheduled Meds: . amLODipine  5 mg Oral BID  . ARIPiprazole  5 mg Oral Daily  . aspirin EC  81 mg Oral Daily  . benzonatate  100 mg Oral TID  . feeding supplement (ENSURE ENLIVE)  237 mL Oral Once  . guaiFENesin  600 mg Oral BID  . insulin aspart  0-5 Units Subcutaneous QHS  . insulin aspart  0-9 Units Subcutaneous TID WC  . ipratropium-albuterol  3 mL Nebulization TID  . isosorbide mononitrate  30 mg Oral Daily  . metoprolol tartrate  50 mg Oral BID  . mometasone-formoterol  2 puff Inhalation BID  . pantoprazole  40 mg Oral Daily  . rosuvastatin  10 mg Oral q1800  . sertraline  200 mg Oral Daily  . sodium chloride flush  3 mL Intravenous Q12H   Continuous Infusions: . sodium chloride     PRN Meds: sodium chloride, acetaminophen **OR** acetaminophen, albuterol, gabapentin, hydrALAZINE, menthol-cetylpyridinium, morphine, morphine injection, nitroGLYCERIN, ondansetron (ZOFRAN) IV, sodium chloride flush, sodium chloride flush   Vital Signs    Vitals:   08/08/18 2207 08/08/18 2350 08/09/18 0720 08/09/18 0751  BP: 114/60   137/70  Pulse: 74 64  66  Resp: 20 14 12    Temp: 98.2 F (36.8 C)   98.2 F (36.8 C)  TempSrc:    Oral  SpO2: 97% 99% 97% 98%  Weight:      Height:        Intake/Output Summary (Last 24 hours) at 08/09/2018 1006 Last data filed at 08/09/2018 0933 Gross per 24 hour  Intake 3 ml  Output 400 ml  Net -397 ml   Last 3 Weights 08/01/2018 06/28/2018 06/26/2018  Weight (lbs) 278 lb 283 lb 12.8 oz 283 lb  Weight (kg) 126.1 kg 128.731 kg 128.368 kg  Some encounter information is confidential and restricted. Go to Review Flowsheets activity to see all data.      Telemetry    NSR -  Personally Reviewed  ECG    None today  Physical Exam   GEN: Morbidly obese BF in NAD  Neck: no JVD  Cardiac: RRR, no murmurs, rubs, or gallops.  Respiratory: clear GI: Soft, nontender, non-distended  MS: Triv edema; No deformity. Neuro:  Nonfocal  Psych: Normal affect   Labs    Chemistry Recent Labs  Lab 08/06/18 0328 08/07/18 0221 08/09/18 0744  NA 138 136 139  K 3.5 3.6 4.2  CL 96* 96* 103  CO2 32 31 30  GLUCOSE 92 98 97  BUN 11 20 16   CREATININE 0.67 0.79 0.71  CALCIUM 9.5 9.3 9.2  PROT  --   --  7.0  ALBUMIN  --   --  3.3*  AST  --   --  57*  ALT  --   --  192*  ALKPHOS  --   --  133*  BILITOT  --   --  0.8  GFRNONAA >60 >60 >60  GFRAA >60 >60 >60  ANIONGAP 10 9 6      Hematology Recent Labs  Lab 08/06/18 0328 08/07/18 0221 08/09/18 0744  WBC 7.9 8.8 7.6  RBC 5.24* 5.16* 4.74  HGB 11.1* 10.6* 10.0*  HCT 36.7 36.1 33.4*  MCV  70.0* 70.0* 70.5*  MCH 21.2* 20.5* 21.1*  MCHC 30.2 29.4* 29.9*  RDW 19.0* 18.8* 18.8*  PLT 345 270 240    Cardiac Enzymes Recent Labs  Lab 08/04/18 1147 08/04/18 1956 08/05/18 0014  TROPONINI <0.03 <0.03 <0.03    No results for input(s): TROPIPOC in the last 168 hours.   BNPNo results for input(s): BNP, PROBNP in the last 168 hours.   DDimer No results for input(s): DDIMER in the last 168 hours.   Radiology    No results found.  Cardiac Studies   Echo 09/25/2017 LV EF: 65% -   70%  ------------------------------------------------------------------- Indications:      Chest pain 786.51.  ------------------------------------------------------------------- History:   PMH:   Atrial fibrillation.  Coronary artery disease. PMH:  Sleep Apnea. Acute Renal Failure.  Risk factors: Hypertension. Diabetes mellitus. Dyslipidemia.  ------------------------------------------------------------------- Study Conclusions  - Left ventricle: The cavity size was normal. Wall thickness was   increased in a pattern of  mild LVH. Systolic function was   vigorous. The estimated ejection fraction was in the range of 65%   to 70%. Wall motion was normal; there were no regional wall   motion abnormalities. Doppler parameters are consistent with   abnormal left ventricular relaxation (grade 1 diastolic   dysfunction). - Aortic valve: Poorly visualized. Probably trileaflet. There was   no stenosis. - Mitral valve: There was no significant regurgitation. - Right ventricle: The cavity size was normal. Systolic function   was normal. - Pulmonary arteries: No complete TR doppler jet so unable to   estimate PA systolic pressure. - Inferior vena cava: The vessel was normal in size. The   respirophasic diameter changes were in the normal range (>= 50%),   consistent with normal central venous pressure. - Pericardium, extracardiac: A trivial pericardial effusion was   identified.  Impressions:  - Normal LV size with mild LV hypertrophy. EF 65-70%, vigorous   systolic function. Normal RV size and systolic function. No   significant valvular abnormalities.  Patient Profile     68 y.o. female with a hx of CAD with stent to pRCA 2008, DM-2, HTN, sleep apnea, asthma, PAF, HLD and arthritis who was admitted for wheezing and SOB, she also complained of chest pain  Assessment & Plan    1. Chest pain: Admit EKG showed anterior TWI with subsquent resolution. Serial troponin levels negative. Myovue done yesterday-   Difficult study due to size. Consistent with ischemia in the inferior, inferolateral, apical distributions   Different from prior scans   Cannot exclude some shifting soft tissue artifact. Based on above with changes and symptoms on presentation we would recomm L heart cath to define anatomy and LVEDP. Unfortunately Coumadin was not held and in fact patient received a higher dose of coumadin Sunday. Now  INR increased 1.88>>1.7>>2.02>>2.45. given Vit K yesterday and INR down to 1.23.  She will  be able to have  cath today. Further plans depending on results.   2. CAD s/p stent to [facility] 2008: intermittent chest pain at rest. Per #1.   3. HTN: BP is well controlled.   4. HLD:  Would restart home Crestor    5. DM II: defer to PCP  6. PAF: on coumadin. Maintaining NSR. Will resume coumadin post cath.   7. Morbid obesity.   8. COPD exacerbation  Pt on steroids   Improved.   9. Elevated LFTs. Unclear etiology. Has been on crestor long term and LFTs were normal early in the month.  Will defer to primary team.        For questions or updates, please contact [place] Please consult [email or web address] for contact info under        Signed, Lolamae Voisin [place], MD  08/09/2018, 10:06 AM

## 2018-08-09 NOTE — Progress Notes (Addendum)
Occupational Therapy Treatment Patient Details Name: Michele [name] MRN: [id number] DOB: 02-Feb-1951 Today's Date: 08/09/2018    History of present illness Michele [name] is a 68 y.o. female with medical history significant of sleep apnea noncompliant to CPAP, paroxysmal A. 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 She has been experiencing ongoing shortness of breath and wheezing for about a month. Patient was admitted 1/14 with COPD exacerbation.    OT comments  Pt progressing toward established OT goals. Pt currently on 2lnc and maintained SpO2 >94% throughout session. Pt initiated energy conservation strategy during grooming task at sink, requesting to sit to complete grooming. Pt currently requires S for functional mobility at [facility] with 1vc for safe hand placement. Pt able to doff socks with AE. Pt verbalized use of AE to assist with LB ADL and toilet hygiene. Caregiver and daughter able to return demonstrate use of sock aide. Pt's daughter stated she will purchase AE at later time. Pt's caregiver demonstrated safe and appropriate transfers with pt. Feel pt will continue to benefit from skilled OT services to maximize independence and safety with ADL for safe d/c home. Will continue to follow acutely.   HR maintained 77bpm-89pbm with functional mobility in room    Follow Up Recommendations  Home health OT;Supervision/Assistance - 24 hour    Equipment Recommendations  3 in 1 bedside commode    Recommendations for Other Services      Precautions / Restrictions Precautions Precautions: Fall Precaution Comments: using RW with assist Restrictions Weight Bearing Restrictions: No       Mobility Bed Mobility Overal bed mobility: Modified Independent             General bed mobility comments:  progresed from supine to sit EOB mod I   Transfers Overall transfer level: Needs assistance Equipment used: Rolling walker (2 wheeled) Transfers: Sit to/from Stand Sit to Stand: Supervision              Balance Overall balance assessment: Needs assistance Sitting-balance support: No upper extremity supported;Feet unsupported Sitting balance-Leahy Scale: Fair Sitting balance - Comments: able to adjust socks while sitting EOB without LE/UE support   Standing balance support: During functional activity;Single extremity supported Standing balance-Leahy Scale: Fair Standing balance comment: pt stood at sink for 1 min with reliance on single UE support                           ADL either performed or assessed with clinical judgement   ADL Overall ADL's : Needs assistance/impaired     Grooming: Oral care;Wash/dry face;Supervision/safety;Sitting Grooming Details (indicate cue type and reason): pt initiation of verbalization to sit during grooming Upper Body Bathing: Set up;Sitting           Lower Body Dressing: Sit to/from stand;Supervision/safety Lower Body Dressing Details (indicate cue type and reason): pt able to adjust socks while sitting EOB; able to doff sock with sock aide,  Toilet Transfer: Supervision/safety;Ambulation;RW   Toileting- Clothing Manipulation and Hygiene: Supervision/safety;Sit to/from stand       Functional mobility during ADLs: Supervision/safety;Rolling walker General ADL Comments: Caregiver and pt's daughter able to return demonstrated use of sock aide, verbalized they will teach pt since pt required use of commode during demonstration, educated pt, daughter and caregiver on energy conservation strategies during ADL and activity progression/tolerance;  Vision       Perception     Praxis      Cognition Arousal/Alertness: Awake/alert Behavior During Therapy: WFL for tasks assessed/performed Overall Cognitive Status: Within  Functional Limits for tasks assessed                                          Exercises     Shoulder Instructions       General Comments pt's daughter and caregive present at end of session, with no additional questions.     Pertinent Vitals/ Pain       Pain Assessment: No/denies pain  Home Living                                          Prior Functioning/Environment              Frequency  Min 2X/week        Progress Toward Goals  OT Goals(current goals can now be found in the care plan section)  Progress towards OT goals: Progressing toward goals  Acute Rehab OT Goals Patient Stated Goal: to go home and get stronger OT Goal Formulation: With patient Time For Goal Achievement: 08/16/18 Potential to Achieve Goals: Good ADL Goals Pt Will Perform Lower Body Dressing: with set-up Pt Will Transfer to Toilet: with modified independence Additional ADL Goal #1: Pt will demonstrate 3 energy conservation strategies during ADL.  Plan Discharge plan remains appropriate    Co-evaluation                 AM-PAC OT "6 Clicks" Daily Activity     Outcome Measure   Help from another person eating meals?: None Help from another person taking care of personal grooming?: A Little Help from another person toileting, which includes using toliet, bedpan, or urinal?: A Little Help from another person bathing (including washing, rinsing, drying)?: A Little Help from another person to put on and taking off regular upper body clothing?: A Little Help from another person to put on and taking off regular lower body clothing?: A Little 6 Click Score: 19    End of Session Equipment Utilized During Treatment: Rolling walker;Oxygen  OT Visit Diagnosis: History of falling (Z91.81);Other abnormalities of gait and mobility (R26.89)   Activity Tolerance Patient tolerated treatment well   Patient Left with chair alarm set;with family/visitor  present(pt on commode, caregiver demonstrated safe transfer with pt)   Nurse Communication Mobility status        Time: 6811-5726 OT Time Calculation (min): 45 min  Charges: OT General Charges $OT Visit: 1 Visit OT Treatments $Self Care/Home Management : 23-37 mins $Therapeutic Activity: 8-22 mins  Dorinda Hill OTR/L Acute Rehabilitation Services Office: [place] 08/09/2018, 10:20 AM

## 2018-08-09 NOTE — Progress Notes (Addendum)
PROGRESS NOTE  Michele [name] MYT:[id number] DOB: [birth year] DOA: 08/01/2018 PCP: [name], MD  Brief History   68 year old woman PMH COPD presented with shortness of breath, admitted for acute on chronic hypoxic respiratory failure, COPD exacerbation.  She was treated with standard therapy but developed chest pain several days in the hospitalization was seen by cardiology.  A 2-day nuclear study which was concerning for ischemia and therefore cardiac catheterization was planned but delayed by elevated INR.  Cardiac catheterization 1/22 with recommendation for medical management.  A & P  COPD exacerbation, acute on chronic hypoxic respiratory failure, on 2 L nasal cannula at home.  Initially required BiPAP.  Now stable on home oxygen.  Treated with antibiotics, steroids, bronchodilators. --Resolved.  Off steroids.  Continue bronchodilators and supplemental oxygen.  Chest pain, PMH CAD, s/p stent 2008  --Continues to have chest pain.  Cardiac catheterization noted.  Cardiology is recommended medical management.  Continue aspirin, statin  Elevated transaminases.  Has been on statin long-term, LFTs within normal limits within the last 30 days. --Etiology unclear, alkaline phosphatase modestly elevated, total bilirubin within normal limits. --Repeat LFTs in a.m.  Paroxysmal atrial fibrillation --Appears stable.  Telemetry sinus rhythm.  Resume warfarin when able per cardiology.  Microcytic anemia --Hemoglobin stable.  Follow-up as an outpatient.  Diabetes mellitus type 2, with hyperglycemia steroid-induced as an inpatient.  On metformin at home. --CBG stable.  Obstructive sleep apnea, noncompliant with CPAP  Morbid obesityBody mass index is 50.85 kg/m. --Management as per dietitian.  DVT prophylaxis: warfarin Code Status: Full Family Communication: none Disposition Plan: home with HHPT, OT, RN   Murray Hodgkins, MD  [facility] Direct contact: see  [email or web address]  7PM-7AM contact night coverage as above 08/09/2018, 3:16 PM  LOS: 8 days   Consultants  . Cardiology   Procedures  . Cath  Right radial approach with vascular ultrasound guidance.  Unable to complete procedure due to tortuosity and what appears to be a bovine arch, producing angulation that prevented catheter torque.  Ultrasound-guided right femoral artery access at the level of the lower third of the femoral head above the bifurcation.  Dominant right coronary with proximal to mid segmental stenosis up to 85% with the most severe area being in-stent restenosis within a 3.5 x 12 mm driver bare-metal stent placed in 2008.  Proximal to the stenosed area and distal the vessel is very ectatic and measures greater than 5.5 mm in diameter.  Mid vessel 70% narrowing and distal 70% eccentric narrowing.  Ectatic left main without obstruction.  LAD ectatic with slow flow and no significant fixed obstruction until the apical segment where there is 80% narrowing.  The circumflex gives origin to a high lateral bifurcating first obtuse marginal which is ectatic/aneurysmal, containing eccentric 50% proximal narrowing.  Relatively slow flow is noted.  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  The continuation of the circumflex beyond the first marginal is also ectatic.  Low normal LVEF of 50%.  LVEDP 23 mmHg.  Chest discomfort was present post procedure described as tightness that gradually improved with sublingual nitroglycerin x2 and fentanyl 50 mcg IV.  Antibiotics  .   Interval History/Subjective  Feels a lot better, breathing better, has had some chest pain for the last several weeks.  Going for cardiac catheterization shortly.  Objective   Vitals:  Vitals:   08/09/18 1302 08/09/18 1334  BP: (!) 151/78 123/82  Pulse: 63 (!) 59  Resp: 20 (!) 22  Temp:  98  F (36.7 C)  SpO2: 99% 95%    Exam:  Constitutional:  . Appears calm and comfortable Eyes:  . pupils  and irises appear normal ENMT:  . grossly normal hearing  . Tongue appears unremarkable Respiratory:  . CTA bilaterally, no w/r/r.  . Respiratory effort normal. Cardiovascular:  . RRR, no m/r/g . No LE extremity edema   Abdomen:  . Soft, nontender, nondistended Psychiatric:  . Mental status o Mood, affect appropriate  I have personally reviewed the following:   Today's Data  . BMP unremarkable.  AST 57, ALP 192.  Alkaline phosphatase 133.  Total bilirubin within normal limits.  Lab Data  . Troponins negative . Ferritin within normal limits  Micro Data  .   Imaging  . Chest x-ray no acute disease  Cardiology Data  . EKGs reviewed  Other Data  .   Scheduled Meds: . amLODipine  5 mg Oral BID  . ARIPiprazole  5 mg Oral Daily  . aspirin EC  81 mg Oral Daily  . benzonatate  100 mg Oral TID  . feeding supplement (ENSURE ENLIVE)  237 mL Oral Once  . guaiFENesin  600 mg Oral BID  . [START ON 08/10/2018] heparin  5,000 Units Subcutaneous Q8H  . insulin aspart  0-5 Units Subcutaneous QHS  . insulin aspart  0-9 Units Subcutaneous TID WC  . ipratropium-albuterol  3 mL Nebulization TID  . isosorbide mononitrate  30 mg Oral Daily  . metoprolol tartrate  50 mg Oral BID  . mometasone-formoterol  2 puff Inhalation BID  . pantoprazole  40 mg Oral Daily  . rosuvastatin  10 mg Oral q1800  . sertraline  200 mg Oral Daily  . sodium chloride flush  3 mL Intravenous Q12H   Continuous Infusions: . sodium chloride 50 mL/hr at 08/09/18 1400  . sodium chloride      Principal Problem:   Acute on chronic respiratory failure with hypoxia (HCC) Active Problems:   Obstructive sleep apnea   Essential hypertension   Diabetes mellitus, type II (HCC)   Paroxysmal A-fib (HCC)   Other chest pain   COPD exacerbation (HCC)   Elevated transaminase level   Microcytic anemia   Morbid obesity with BMI of 50.0-59.9, adult ([place])   LOS: 8 days         '

## 2018-08-09 NOTE — Progress Notes (Signed)
TR BAND REMOVAL  LOCATION:    right radial  DEFLATED PER PROTOCOL:    Yes.    TIME BAND OFF / DRESSING APPLIED:    1530   SITE UPON ARRIVAL:    Level 0  SITE AFTER BAND REMOVAL:    Level 0  CIRCULATION SENSATION AND MOVEMENT:    Within Normal Limits   Yes.    COMMENTS:   Tolerated procedure well

## 2018-08-09 NOTE — Progress Notes (Signed)
Physical Therapy Treatment Patient Details Name: Michele [name] MRN: [id number] DOB: 1950-08-08 Today's Date: 08/09/2018    History of Present Illness Michele [name] is a 69 y.o. female with medical history significant of sleep apnea noncompliant to CPAP, paroxysmal A. 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 She has been experiencing ongoing shortness of breath and wheezing for about a month. Patient was admitted 1/14 with COPD exacerbation.     PT Comments    Patient asleep upon entry, bedrest cleared per RN. Ambulating on 2L with DOE 2/4, VSS. Will progress next session and trial rollator.     Follow Up Recommendations  Home health PT;Supervision/Assistance - 24 hour     Equipment Recommendations  (TBD, rollator vs RW)    Recommendations for Other Services       Precautions / Restrictions Precautions Precautions: Fall Precaution Comments: using RW with assist Restrictions Weight Bearing Restrictions: Yes RLE Weight Bearing: Non weight bearing    Mobility  Bed Mobility Overal bed mobility: Modified Independent             General bed mobility comments: progresed from supine to sit EOB mod I   Transfers Overall transfer level: Needs assistance Equipment used: Rolling walker (2 wheeled) Transfers: Sit to/from Stand Sit to Stand: Supervision            Ambulation/Gait Ambulation/Gait assistance: Min guard Gait Distance (Feet): 100 Feet Assistive device: Rolling walker (2 wheeled) Gait Pattern/deviations: Step-to pattern;Step-through pattern;Wide base of support;Decreased stride length Gait velocity: decreased   General Gait Details: patient ambulating on 2L, VSS, slower and less stable today with NWB on RUE.    Stairs             Wheelchair Mobility     Modified Rankin (Stroke Patients Only)       Balance Overall balance assessment: Needs assistance Sitting-balance support: No upper extremity supported;Feet unsupported Sitting balance-Leahy Scale: Fair Sitting balance - Comments: able to adjust socks while sitting EOB without LE/UE support     Standing balance-Leahy Scale: Fair Standing balance comment: pt stood at sink for 1 min with reliance on single UE support                            Cognition Arousal/Alertness: Awake/alert Behavior During Therapy: WFL for tasks assessed/performed Overall Cognitive Status: Within Functional Limits for tasks assessed                                        Exercises      General Comments        Pertinent Vitals/Pain Pain Assessment: No/denies pain    Home Living                      Prior Function            PT Goals (current goals can now be found in the care plan section) Acute Rehab PT Goals Patient Stated Goal: to go home and get stronger PT Goal Formulation: With patient Potential to Achieve Goals: Good Progress towards PT goals: Progressing toward goals    Frequency    Min 3X/week      PT Plan Current plan remains appropriate  Co-evaluation              AM-PAC PT "6 Clicks" Mobility   Outcome Measure  Help needed turning from your back to your side while in a flat bed without using bedrails?: None Help needed moving from lying on your back to sitting on the side of a flat bed without using bedrails?: None Help needed moving to and from a bed to a chair (including a wheelchair)?: None Help needed standing up from a chair using your arms (e.g., wheelchair or bedside chair)?: A Little Help needed to walk in hospital room?: A Little Help needed climbing 3-5 steps with a railing? : A Little 6 Click Score: 21    End of Session Equipment Utilized During Treatment: Gait belt;Oxygen Activity Tolerance: Patient  limited by fatigue Patient left: in chair;with call bell/phone within reach;with family/visitor present Nurse Communication: Mobility status PT Visit Diagnosis: Unsteadiness on feet (R26.81);Difficulty in walking, not elsewhere classified (R26.2);Other abnormalities of gait and mobility (R26.89)     Time: 1645-1710 PT Time Calculation (min) (ACUTE ONLY): 25 min  Charges:  $Gait Training: 8-22 mins $Therapeutic Activity: 8-22 mins                     Reinaldo Berber, PT, DPT Acute Rehabilitation Services Pager: [phone] Office: [phone]     Reinaldo Berber 08/09/2018, 5:27 PM

## 2018-08-09 NOTE — Progress Notes (Signed)
ANTICOAGULATION CONSULT NOTE - Follow-Up Consult  Pharmacy Consult for Warfarin Indication: atrial fibrillation  Allergies  Allergen Reactions  . Meprobamate Nausea And Vomiting    Patient Measurements: Height: 5\' 2"  (157.5 cm) Weight: 278 lb (126.1 kg) IBW/kg (Calculated) : 50.1   Vital Signs: Temp: 98 F (36.7 C) (01/22 1334) Temp Source: Oral (01/22 1334) BP: 123/82 (01/22 1334) Pulse Rate: 59 (01/22 1334)  Labs: Recent Labs    08/07/18 0221 08/08/18 0237 08/09/18 0744  HGB 10.6*  --  10.0*  HCT 36.1  --  33.4*  PLT 270  --  240  LABPROT 22.6* 26.2* 15.4*  INR 2.02 2.45 1.23  CREATININE 0.79  --  0.71    Estimated Creatinine Clearance: 86.7 mL/min (by C-G formula based on SCr of 0.71 mg/dL).   Medical History: Past Medical History:  Diagnosis Date  . Allergic rhinitis   . ARF (acute renal failure) ([place]) 03/2017  . 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 Asthma   . CAD (coronary artery disease) 01/30/2007   stents trivial coronary artery disease diffusely, with a recent deployment od a intracoronary artery stent, 3.5 x 12 mm driver stent with no more than 20-30% in- stents restenosis.done by Dr [name] with re-look on novenber 10 2008 revealing a widely patent stent with otherwise trival CAD and normal LV function  . Carpal tunnel syndrome, right   . Chest pain 07/17/12009   2 D Echo EF >55%  . Chronic lower back pain   . Complication of anesthesia    slow to wake up  . COPD (chronic obstructive pulmonary disease) ([place])   . DDD (degenerative disc disease), lumbar    multilevel lumbar degenerative disc disease  . Depression   . Diabetes mellitus type II   . Gait instability   . GERD (gastroesophageal reflux disease)   . History of kidney stones   . History of stomach ulcers    "years ago" (08/01/2018)  . Hyperlipidemia   . Hypertension   . IBS (irritable bowel syndrome)   . Iron deficiency anemia 07/13/2016  . Obesity   . On  home oxygen therapy    "2L 24/7" (08/01/2018)  . OSA on CPAP    "w/O2" (08/01/2018)  . Paroxysmal A-fib ([place])   . PTSD (post-traumatic stress disorder)   . Stroke [facility])    "not sure when; showed up on one of my tests" (08/01/2018)  . TIA (transient ischemic attack)    "several; they say I have small ones" (08/01/2018)  . Urinary incontinence     Assessment: 68 yo female admitted with a COPD exacerbation and chest pain. Pharmacy consulted to restart Coumadin for atrial fibrillation.   15 mg warfarin given 1/19, then warfarin held prior to cath. Home regimen 11.25 mg daily EXCEPT for 7.5 mg on MWF.  INR decreased to 1.23 today after receiving 10 mg vitamin K on 1/21 to bring INR below 1.8 for cardiac cath. H/H and pltc stable. No s/sx bleeding.     Goal of Therapy:  INR 2-3   Plan:  Give 15 mg warfarin today. Daily INR Monitor for s/sx of bleeding  Erin Hearing PharmD., BCPS Clinical Pharmacist 08/09/2018 3:32 PM

## 2018-08-10 ENCOUNTER — Ambulatory Visit (HOSPITAL_COMMUNITY): Payer: Medicare Other | Admitting: Licensed Clinical Social Worker

## 2018-08-10 LAB — COMPREHENSIVE METABOLIC PANEL
ALT: 132 U/L — ABNORMAL HIGH (ref 0–44)
AST: 31 U/L (ref 15–41)
Albumin: 3 g/dL — ABNORMAL LOW (ref 3.5–5.0)
Alkaline Phosphatase: 110 U/L (ref 38–126)
Anion gap: 7 (ref 5–15)
BILIRUBIN TOTAL: 0.6 mg/dL (ref 0.3–1.2)
BUN: 13 mg/dL (ref 8–23)
CO2: 29 mmol/L (ref 22–32)
Calcium: 8.9 mg/dL (ref 8.9–10.3)
Chloride: 104 mmol/L (ref 98–111)
Creatinine, Ser: 0.67 mg/dL (ref 0.44–1.00)
GFR calc Af Amer: >60 mL/min (ref >60)
Glucose, Bld: 112 mg/dL — ABNORMAL HIGH (ref 70–99)
Potassium: 4 mmol/L (ref 3.5–5.1)
Sodium: 140 mmol/L (ref 135–145)
Total Protein: 6.4 g/dL — ABNORMAL LOW (ref 6.5–8.1)

## 2018-08-10 LAB — PROTIME-INR
INR: 1.14
Prothrombin Time: 14.5 seconds (ref 11.4–15.2)

## 2018-08-10 LAB — GLUCOSE, CAPILLARY
GLUCOSE-CAPILLARY: 109 mg/dL — AB (ref 70–99)
Glucose-Capillary: 84 mg/dL (ref 70–99)

## 2018-08-10 LAB — CBC
HEMATOCRIT: 31.9 % — AB (ref 36.0–46.0)
Hemoglobin: 9.4 g/dL — ABNORMAL LOW (ref 12.0–15.0)
MCH: 21 pg — ABNORMAL LOW (ref 26.0–34.0)
MCHC: 29.5 g/dL — ABNORMAL LOW (ref 30.0–36.0)
MCV: 71.2 fL — ABNORMAL LOW (ref 80.0–100.0)
Platelets: 238 10*3/uL (ref 150–400)
RBC: 4.48 MIL/uL (ref 3.87–5.11)
RDW: 18.7 % — ABNORMAL HIGH (ref 11.5–15.5)
WBC: 6.6 10*3/uL (ref 4.0–10.5)
nRBC: 0 % (ref 0.0–0.2)

## 2018-08-10 MED ORDER — IPRATROPIUM-ALBUTEROL 0.5-2.5 (3) MG/3ML IN SOLN: 3.0000 mL | Freq: Four times a day (QID) | RESPIRATORY_TRACT | Status: DC | PRN

## 2018-08-10 MED ORDER — CLOPIDOGREL BISULFATE 75 MG PO TABS
ORAL_TABLET | ORAL | Status: AC
  Filled 2018-08-10: qty 1

## 2018-08-10 MED ORDER — ASPIRIN 81 MG PO TBEC: 81.0000 mg | DELAYED_RELEASE_TABLET | Freq: Every day | ORAL | Status: AC

## 2018-08-10 MED ORDER — ALBUTEROL SULFATE HFA 108 (90 BASE) MCG/ACT IN AERS: 2.0000 | INHALATION_SPRAY | Freq: Four times a day (QID) | RESPIRATORY_TRACT | Status: DC | PRN

## 2018-08-10 MED ORDER — ASPIRIN 81 MG PO CHEW
CHEWABLE_TABLET | ORAL | Status: AC
  Filled 2018-08-10: qty 1

## 2018-08-10 MED ORDER — AMLODIPINE BESYLATE 5 MG PO TABS: 5.0000 mg | ORAL_TABLET | Freq: Two times a day (BID) | ORAL | 0 refills | Status: DC

## 2018-08-10 MED ORDER — WARFARIN SODIUM 7.5 MG PO TABS: 15.0000 mg | ORAL_TABLET | Freq: Once | ORAL | Status: DC

## 2018-08-10 MED ORDER — ISOSORBIDE MONONITRATE ER 30 MG PO TB24: 30.0000 mg | ORAL_TABLET | Freq: Every day | ORAL | 0 refills | Status: DC

## 2018-08-10 MED FILL — Heparin Sodium (Porcine) Inj 1000 Unit/ML: INTRAMUSCULAR | Qty: 10 | Status: AC

## 2018-08-10 NOTE — Progress Notes (Signed)
Patient has f/u coumadin check on 1/27 which she should keep. I have also arranged 2 week post hospital f/u. Appts placed on AVS.

## 2018-08-10 NOTE — Discharge Summary (Signed)
Physician Discharge Summary  Michele [name] ZOX:[id number] DOB: 1950-07-30 DOA: 08/01/2018  PCP: [name], MD  Admit date: 08/01/2018 Discharge date: 08/10/2018  Recommendations for Outpatient Follow-up:   Elevated transaminases.  Has been on statin long-term, LFTs within normal limits within the last 30 days. --Etiology unclear but trending down, alkaline phosphatase is normalized and total bilirubin remains within normal limits.  Consider repeat study as an outpatient.  Follow-up Information    [facility] Follow up.   Specialty:  Cardiology Why:  Keep follow-up appointment as scheduled 1/27 for Coumadin clinic check. Contact information: [address] [place], PA-C Follow up.   Specialties:  Cardiology, Radiology Why:  [facility] - 08/24/18 at 10am as below. Please arrive 15 minutes prior to appointment to register. Lurena Joiner is one of the PAs that works closely with Dr. Kennon Holter care team. Contact information: [address] [place] [address] [phone]            Discharge Diagnoses: Principal diagnosis is #1 1. COPD exacerbation, acute on chronic hypoxic respiratory failure, on 2 L nasal cannula at home 2. Chest pain, PMH CAD, s/p stent 2008  3. Elevated transaminases 4. Paroxysmal atrial fibrillation 5. Microcytic anemia 6. Diabetes mellitus type 2, with hyperglycemia steroid-induced as an inpatient 7. Obstructive sleep apnea 8. Morbid obesity   Discharge Condition: improved Disposition: home  Diet recommendation: heart healthy, diabetic diet  Filed Weights   08/01/18 1900 08/10/18 0550  Weight: 126.1 kg 128.2 kg    History of present illness:  68 year old woman PMH COPD presented with shortness of breath, admitted for acute on chronic hypoxic respiratory failure, COPD exacerbation.    Hospital Course:  She was treated with  standard therapy for COPD exacerbation with resolution but developed chest pain several days in the hospitalization was seen by cardiology.  A 2-day nuclear study which was concerning for ischemia and therefore cardiac catheterization was planned but delayed by elevated INR.  Cardiac catheterization 1/22 with recommendation for medical management.  COPD exacerbation, acute on chronic hypoxic respiratory failure, on 2 L nasal cannula at home.  Initially required BiPAP.  Now stable on home oxygen.  Treated with antibiotics, steroids, bronchodilators. --Resolved.  Off steroids.  Continue bronchodilators, supplemental oxygen.  Chest pain, PMH CAD, s/p stent 2008  --Chest pain resolved, was present for 3 weeks, patient associates with coughing.  Was located in her back paravertebral.  Cardiac catheterization as below.  Cardiology recommended medical management.  Continue aspirin, statin  Elevated transaminases.  Has been on statin long-term, LFTs within normal limits within the last 30 days. --Etiology unclear but trending down, alkaline phosphatase is normalized and total bilirubin remains within normal limits.  Consider repeat study as an outpatient.  Paroxysmal atrial fibrillation --Stable.    Telemetry sinus rhythm.    Resume warfarin on discharge.  Microcytic anemia --Hemoglobin stable.  Follow-up as an outpatient.  Diabetes mellitus type 2, with hyperglycemia steroid-induced as an inpatient.  On metformin at home. --CBG stable.  Resume metformin on discharge.  Obstructive sleep apnea, noncompliant with CPAP  Morbid obesity Body mass index is 50.85 kg/m. --Management as per dietitian.  Consultants   Cardiology   Procedures   Cath  Right radial approach with vascular ultrasound guidance. Unable to complete procedure due to tortuosity and what appears to be a bovine arch, producing angulation that prevented catheter torque.  Ultrasound-guided right femoral  artery access at  the level of the lower third of the femoral head above the bifurcation.  Dominant right coronary with proximal to mid segmental stenosis up to 85% with the most severe area being in-stent restenosis within a 3.5 x 12 mm driver bare-metal stent placed in 2008. Proximal to the stenosed area and distal the vessel is very ectatic and measures greater than 5.5 mm in diameter. Mid vessel 70% narrowing and distal 70% eccentric narrowing.  Ectatic left main without obstruction.  LAD ectatic with slow flow and no significant fixed obstruction until the apical segment where there is 80% narrowing.  The circumflex gives origin to a high lateral bifurcating first obtuse marginal which is ectatic/aneurysmal, containing eccentric 50% proximal narrowing. Relatively slow flow is noted. 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 The continuation of the circumflex beyond the first marginal is also ectatic.  Low normal LVEF of 50%. LVEDP 23 mmHg.  Chest discomfort was present post procedure described as tightness that gradually improved with sublingual nitroglycerin x2 and fentanyl 50 mcg IV.  Today's assessment: S: Feels well, chest pain, back pain has resolved.  This was present for about 3 weeks and she thinks it was from coughing so much.  Breathing better. O: Vitals:  Vitals:   08/10/18 0830 08/10/18 1049  BP:  (!) 160/96  Pulse: 72 71  Resp: 16 13  Temp:  98.4 F (36.9 C)  SpO2: 97% 97%    Constitutional:  . Appears calm and comfortable sitting in chair Respiratory:  . CTA bilaterally, no w/r/r.  Poor air movement. Marland Kitchen Respiratory effort normal.  Cardiovascular:  . RRR, no m/r/g . No LE extremity edema   Psychiatric:  . Mental status o Mood, affect appropriate  CBG stable.  CMP notable for AST and ALT trending down.  Total bilirubin and alkaline phosphatase within normal limits.  Hemoglobin stable at 9.4.  Remainder CBC without significant change.  INR 1.14.  Discharge  Instructions  Discharge Instructions    Diet - low sodium heart healthy   Complete by:  As directed    Diet Carb Modified   Complete by:  As directed    Discharge instructions   Complete by:  As directed    Call your physician or seek immediate medical attention for chest pain, shortness of breath or worsening of condition.   Increase activity slowly   Complete by:  As directed      Allergies as of 08/10/2018      Reactions   Meprobamate Nausea And Vomiting      Medication List    STOP taking these medications   amoxicillin-clavulanate 875-125 MG tablet Commonly known as:  AUGMENTIN     TAKE these medications   ADVAIR DISKUS 250-50 MCG/DOSE Aepb Generic drug:  Fluticasone-Salmeterol INHALE ONE PUFF TWICE DAILY   albuterol 108 (90 Base) MCG/ACT inhaler Commonly known as:  VENTOLIN HFA Inhale 2 puffs into the lungs every 6 (six) hours as needed for wheezing or shortness of breath.   amLODipine 5 MG tablet Commonly known as:  NORVASC Take 1 tablet (5 mg total) by mouth 2 (two) times daily.   ARIPiprazole 5 MG tablet Commonly known as:  ABILIFY Take 1 tablet (5 mg total) by mouth daily.   aspirin 81 MG EC tablet Take 1 tablet (81 mg total) by mouth daily. Start taking on:  August 11, 2018   gabapentin 300 MG capsule Commonly known as:  NEURONTIN Take 300 mg by mouth 3 (three) times  daily as needed (for pain).   INTEGRA PLUS Caps Take 1 capsule by mouth every morning.   ipratropium-albuterol 0.5-2.5 (3) MG/3ML Soln Commonly known as:  DUONEB USE 3 ML IN NEBULIZER EVERY 6 HOURS AS NEEDED DX: J45.20   isosorbide mononitrate 30 MG 24 hr tablet Commonly known as:  IMDUR Take 1 tablet (30 mg total) by mouth daily. Start taking on:  August 11, 2018   MAGNESIUM GLUCONATE PO Take by mouth.   metFORMIN 500 MG 24 hr tablet Commonly known as:  GLUCOPHAGE-XR TAKE ONE (1) TABLET BY MOUTH EVERY DAY WITH BREAKFAST What changed:  See the new instructions.     metoprolol tartrate 25 MG tablet Commonly known as:  LOPRESSOR Take 1 tablet (25 mg total) by mouth 2 (two) times daily.   nitroGLYCERIN 0.4 MG SL tablet Commonly known as:  NITROSTAT PLACE 1 TABLET UNDER THE TONGUE EVERY 5 MINUTES AS NEEDED FOR CHEST PAIN.  MAY REPEAT 3 TIMES.  IF NO RELIEF CALL DOCTOR What changed:  See the new instructions.   OXYGEN Inhale 2 L into the lungs as needed (for shortness of breath).   pantoprazole 40 MG tablet Commonly known as:  PROTONIX Take 1 tablet (40 mg total) by mouth 2 (two) times daily before a meal. Take twice a day before meals for 8 weeks, then once a day What changed:    when to take this  additional instructions   rosuvastatin 10 MG tablet Commonly known as:  CRESTOR TAKE ONE (1) TABLET BY MOUTH EVERY DAY What changed:  See the new instructions.   sertraline 100 MG tablet Commonly known as:  ZOLOFT Take 2 tablets (200 mg total) by mouth daily.   trospium 20 MG tablet Commonly known as:  SANCTURA Take 20 mg by mouth at bedtime.   warfarin 7.5 MG tablet Commonly known as:  COUMADIN Take as directed. If you are unsure how to take this medication, talk to your nurse or doctor. Original instructions:  TAKE ONE AND ONE-HALF TO TWO TABLETS BY MOUTH DAILY AS DIRECTED What changed:  See the new instructions.      Allergies  Allergen Reactions  . Meprobamate Nausea And Vomiting    The results of significant diagnostics from this hospitalization (including imaging, microbiology, ancillary and laboratory) are listed below for reference.    Significant Diagnostic Studies: Nm Myocar Multi W/spect W/wall Motion / Ef  Result Date: 08/06/2018  Decreased tracer activity in the inferior, inferolateral, distal lateral and apical wals that inproves in recovery consistent with medium size area of moderate ischemia Note patient's size limits scan some due to soft tissue attenuation.  This is an intermediate risk study.  Nuclear stress  EF: 59%.    Dg Chest Port 1 View  Result Date: 08/01/2018 CLINICAL DATA:  Shortness of breath and cough EXAM: PORTABLE CHEST 1 VIEW COMPARISON:  09/24/2017 FINDINGS: Normal heart size and mediastinal contours. No acute infiltrate or edema. No effusion or pneumothorax. No acute osseous findings. IMPRESSION: Negative chest. Electronically Signed   By: Monte Fantasia M.D.   On: 08/01/2018 08:27    Microbiology: Recent Results (from the past 240 hour(s))  MRSA PCR Screening     Status: None   Collection Time: 08/01/18  4:02 PM  Result Value Ref Range Status   MRSA by PCR NEGATIVE NEGATIVE Final    Comment:        The GeneXpert MRSA Assay (FDA approved for NASAL specimens only), is one component of a comprehensive MRSA  colonization surveillance program. It is not intended to diagnose MRSA infection nor to guide or monitor treatment for MRSA infections. Performed at [facility], [place] [address] [place], [place] [address]   Respiratory Panel by PCR     Status: None   Collection Time: 08/02/18 10:10 AM  Result Value Ref Range Status   Adenovirus NOT DETECTED NOT DETECTED Final   Coronavirus 229E NOT DETECTED NOT DETECTED Final   Coronavirus HKU1 NOT DETECTED NOT DETECTED Final   Coronavirus NL63 NOT DETECTED NOT DETECTED Final   Coronavirus OC43 NOT DETECTED NOT DETECTED Final   Metapneumovirus NOT DETECTED NOT DETECTED Final   Rhinovirus / Enterovirus NOT DETECTED NOT DETECTED Final   Influenza A NOT DETECTED NOT DETECTED Final   Influenza B NOT DETECTED NOT DETECTED Final   Parainfluenza Virus 1 NOT DETECTED NOT DETECTED Final   Parainfluenza Virus 2 NOT DETECTED NOT DETECTED Final   Parainfluenza Virus 3 NOT DETECTED NOT DETECTED Final   Parainfluenza Virus 4 NOT DETECTED NOT DETECTED Final   Respiratory Syncytial Virus NOT DETECTED NOT DETECTED Final   Bordetella pertussis NOT DETECTED NOT DETECTED Final   Chlamydophila pneumoniae NOT DETECTED NOT DETECTED Final    Mycoplasma pneumoniae NOT DETECTED NOT DETECTED Final    Comment: Performed at [facility], [place] [address] [place], [place] [address]     Labs: Basic Metabolic Panel: Recent Labs  Lab 08/05/18 0308 08/06/18 0328 08/07/18 0221 08/09/18 0744 08/10/18 0515  NA 139 138 136 139 140  K 3.9 3.5 3.6 4.2 4.0  CL 96* 96* 96* 103 104  CO2 30 32 31 30 29   GLUCOSE 127* 92 98 97 112*  BUN 16 11 20 16 13   CREATININE 0.68 0.67 0.79 0.71 0.67  CALCIUM 9.6 9.5 9.3 9.2 8.9  MG  --   --   --  2.1  --    Liver Function Tests: Recent Labs  Lab 08/09/18 0744 08/10/18 0515  AST 57* 31  ALT 192* 132*  ALKPHOS 133* 110  BILITOT 0.8 0.6  PROT 7.0 6.4*  ALBUMIN 3.3* 3.0*   No results for input(s): LIPASE, AMYLASE in the last 168 hours. No results for input(s): AMMONIA in the last 168 hours. CBC: Recent Labs  Lab 08/05/18 0308 08/06/18 0328 08/07/18 0221 08/09/18 0744 08/10/18 0515  WBC 9.8 7.9 8.8 7.6 6.6  NEUTROABS  --   --   --  3.9  --   HGB 10.8* 11.1* 10.6* 10.0* 9.4*  HCT 36.7 36.7 36.1 33.4* 31.9*  MCV 69.8* 70.0* 70.0* 70.5* 71.2*  PLT 297 345 270 240 238   Cardiac Enzymes: Recent Labs  Lab 08/04/18 1147 08/04/18 1956 08/05/18 0014  TROPONINI <0.03 <0.03 <0.03   BNP: BNP (last 3 results) Recent Labs    09/24/17 2223  BNP 16.7    ProBNP (last 3 results) No results for input(s): PROBNP in the last 8760 hours.  CBG: Recent Labs  Lab 08/09/18 1348 08/09/18 1706 08/09/18 2128 08/10/18 0634 08/10/18 1153  GLUCAP 77 96 137* 109* 84    Principal Problem:   Acute on chronic respiratory failure with hypoxia (H[facility]) Active Problems:   Obstructive sleep apnea   Essential hypertension   Diabetes mellitus, type II ([facility])   Paroxysmal A-fib ([facility])   Other chest pain   COPD exacerbation ([facility])   Elevated transaminase level   Microcytic anemia   Morbid obesity with BMI of 50.0-59.9, adult ([place])   Time coordinating discharge: 35  minutes  Signed:  Murray Hodgkins, MD  [facility]  08/10/2018, 2:01 PM

## 2018-08-10 NOTE — Progress Notes (Addendum)
Progress Note  Patient Name: [name] Date of Encounter: 08/10/2018  Primary Cardiologist: Quay Burow, MD  Subjective   Continues to report ongoing inspirational CP. Has been going on for several months. SOB improved.  Inpatient Medications    Scheduled Meds: . amLODipine  5 mg Oral BID  . ARIPiprazole  5 mg Oral Daily  . aspirin EC  81 mg Oral Daily  . benzonatate  100 mg Oral TID  . feeding supplement (ENSURE ENLIVE)  237 mL Oral Once  . guaiFENesin  600 mg Oral BID  . heparin  5,000 Units Subcutaneous Q8H  . insulin aspart  0-5 Units Subcutaneous QHS  . insulin aspart  0-9 Units Subcutaneous TID WC  . isosorbide mononitrate  30 mg Oral Daily  . metoprolol tartrate  50 mg Oral BID  . mometasone-formoterol  2 puff Inhalation BID  . pantoprazole  40 mg Oral Daily  . rosuvastatin  10 mg Oral q1800  . sertraline  200 mg Oral Daily  . sodium chloride flush  3 mL Intravenous Q12H  . warfarin  15 mg Oral ONCE-1800  . Warfarin - Pharmacist Dosing Inpatient   Does not apply q1800   Continuous Infusions: . sodium chloride     PRN Meds: sodium chloride, acetaminophen **OR** acetaminophen, acetaminophen, albuterol, gabapentin, hydrALAZINE, ipratropium-albuterol, menthol-cetylpyridinium, morphine, nitroGLYCERIN, ondansetron (ZOFRAN) IV, ondansetron (ZOFRAN) IV, oxyCODONE, sodium chloride flush, sodium chloride flush   Vital Signs    Vitals:   08/10/18 0550 08/10/18 0552 08/10/18 0555 08/10/18 0830  BP:  (!) 160/73 (!) 151/70   Pulse: 71 72 69 72  Resp: 16 17 (!) 21 16  Temp:   98.2 F (36.8 C)   TempSrc:   Oral   SpO2: 94% 98% 95% 97%  Weight: 128.2 kg     Height:        Intake/Output Summary (Last 24 hours) at 08/10/2018 0837 Last data filed at 08/10/2018 0549 Gross per 24 hour  Intake 920.8 ml  Output 1500 ml  Net -579.2 ml   Last 3 Weights 08/10/2018 08/01/2018 06/28/2018  Weight (lbs) 282 lb 10.1 oz 278 lb 283 lb 12.8 oz  Weight (kg)  128.2 kg 126.1 kg 128.731 kg  Some encounter information is confidential and restricted. Go to Review Flowsheets activity to see all data.     Telemetry    NSR - Personally Reviewed  Physical Exam   GEN: No acute distress, morbidly obese AAF.  HEENT: Normocephalic, atraumatic, sclera non-icteric. Neck: No JVD or bruits. Cardiac: RRR no murmurs, rubs, or gallops.  Radials/DP/PT 1+ and equal bilaterally.  Respiratory: Clear to auscultation bilaterally. Breathing is unlabored. GI: Soft, nontender, non-distended, BS +x 4. MS: no deformity. Extremities: No clubbing or cyanosis. No edema. Distal pedal pulses are 2+ and equal bilaterally. Left radial cath site without hematoma or ecchymosis; good pulse. Neuro:  AAOx3. Follows commands. Psych:  Responds to questions appropriately with a normal affect.  Labs    Chemistry Recent Labs  Lab 08/07/18 0221 08/09/18 0744 08/10/18 0515  NA 136 139 140  K 3.6 4.2 4.0  CL 96* 103 104  CO2 31 30 29   GLUCOSE 98 97 112*  BUN 20 16 13   CREATININE 0.79 0.71 0.67  CALCIUM 9.3 9.2 8.9  PROT  --  7.0 6.4*  ALBUMIN  --  3.3* 3.0*  AST  --  57* 31  ALT  --  192* 132*  ALKPHOS  --  133* 110  BILITOT  --  0.8 0.6  GFRNONAA >60 >60 >60  GFRAA >60 >60 >60  ANIONGAP 9 6 7      Hematology Recent Labs  Lab 08/07/18 0221 08/09/18 0744 08/10/18 0515  WBC 8.8 7.6 6.6  RBC 5.16* 4.74 4.48  HGB 10.6* 10.0* 9.4*  HCT 36.1 33.4* 31.9*  MCV 70.0* 70.5* 71.2*  MCH 20.5* 21.1* 21.0*  MCHC 29.4* 29.9* 29.5*  RDW 18.8* 18.8* 18.7*  PLT 270 240 238    Cardiac Enzymes Recent Labs  Lab 08/04/18 1147 08/04/18 1956 08/05/18 0014  TROPONINI <0.03 <0.03 <0.03   No results for input(s): TROPIPOC in the last 168 hours.   BNPNo results for input(s): BNP, PROBNP in the last 168 hours.   DDimer No results for input(s): DDIMER in the last 168 hours.   Radiology    No results found.   Patient Profile     68 y.o. female with CAD with stent to  pRCA 2008, never smoker with severe COPD with chronic respiratory failure on home O2 (but secondhand exposure), DM-2, HTN, PTSD, sleep apnea noncompliant with CPAP, asthma, PAF, HLD and arthritis who presented to [facility] 08/01/2018 with severe respiratory distress and acute on chronic hypoxia requiring Bipap. She was felt to have COPD Exacerbation. Also reported intermittent chest pain, with pleuritic component. Troponins are negative. 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  Assessment & Plan    1. Chest pain - the cause of this seems somewhat elusive. Despite frequent CP, her troponins were negative. Cath shows findings below but I'm not totally sure this is cardiac pain. Her pain remains similar to when she was admitted - worse with inspiration. She reports this has been going on for several months now. This does not really fit the chronicity of pericarditis or PE. I wonder whether or not she needs imaging of the chest such as a CT to further evaluate for structural issue.   2. CAD - continue current regimen. Medical therapy for now. Per cath note, significant tortuosity, undersized bare-metal stent, and large caliber of the right coronary would make repeat intervention and stenting to appropriate size very difficult.  Recommend medical therapy. Amlodipine and Imdur have been added and metoprolol was doubled but she continues to have CP.  3. Acute on chronic hypoxic respiratory failure/COPD - being managed by primary team.  4. Essential HTN - BP labile at times. Follow.  5. Chronic anemia - Hgb has ranged from 9-11 this admission. Appreciate IM input. Needs to be followed given ongoing anticoagulation. Follows in heme-onc.  6. PAF - in NSR this adm. Coumadin resumed.  7. Elevated LFTs - unclear etiology. Downtrending.  For questions or updates, please  contact [facility] Please consult [email or web address] for contact info under Cardiology/STEMI.  Signed, Charlie Pitter, PA-C 08/10/2018, 8:37 AM    Patient seen and examined and history reviewed. Agree with above findings and plan. Patient still has some pain on inspiration. This started after a bad cough and sounds more musculoskeletal. Review of her coronary angiograms show diffuse coronary aneurysmal disease. There is some narrowing of her RCA stent which even though it was a 5 mm stent is still undersized for the vessel. She is not having anginal pain and is on good guideline directed medical therapy. Repeat intervention of the RCA would be high risk and I would not even consider it unless she has evidence of  troponin leak. She is stable for DC today from our standpoint. Will arrange follow up in our Coumadin clinic and OV in our office.   Michele [name], [place] 08/10/2018 9:32 AM

## 2018-08-10 NOTE — Progress Notes (Signed)
ANTICOAGULATION CONSULT NOTE - Follow-Up Consult  Pharmacy Consult for Warfarin Indication: atrial fibrillation  Allergies  Allergen Reactions  . Meprobamate Nausea And Vomiting    Patient Measurements: Height: 5\' 2"  (157.5 cm) Weight: 282 lb 10.1 oz (128.2 kg) IBW/kg (Calculated) : 50.1   Vital Signs: Temp: 98.2 F (36.8 C) (01/23 0555) Temp Source: Oral (01/23 0555) BP: 151/70 (01/23 0555) Pulse Rate: 69 (01/23 0555)  Labs: Recent Labs    08/08/18 0237 08/09/18 0744 08/10/18 0515  HGB  --  10.0* 9.4*  HCT  --  33.4* 31.9*  PLT  --  240 238  LABPROT 26.2* 15.4* 14.5  INR 2.45 1.23 1.14  CREATININE  --  0.71 0.67    Estimated Creatinine Clearance: 87.6 mL/min (by C-G formula based on SCr of 0.67 mg/dL).   Medical History: Past Medical History:  Diagnosis Date  . Allergic rhinitis   . ARF (acute renal failure) ([place]) 03/2017  . 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 Asthma   . CAD (coronary artery disease) 01/30/2007   stents trivial coronary artery disease diffusely, with a recent deployment od a intracoronary artery stent, 3.5 x 12 mm driver stent with no more than 20-30% in- stents restenosis.done by Dr [name] with re-look on novenber 10 2008 revealing a widely patent stent with otherwise trival CAD and normal LV function  . Carpal tunnel syndrome, right   . Chest pain 07/17/12009   2 D Echo EF >55%  . Chronic lower back pain   . Complication of anesthesia    slow to wake up  . COPD (chronic obstructive pulmonary disease) ([place])   . DDD (degenerative disc disease), lumbar    multilevel lumbar degenerative disc disease  . Depression   . Diabetes mellitus type II   . Gait instability   . GERD (gastroesophageal reflux disease)   . History of kidney stones   . History of stomach ulcers    "years ago" (08/01/2018)  . Hyperlipidemia   . Hypertension   . IBS (irritable bowel syndrome)   . Iron deficiency anemia 07/13/2016  . Obesity    . On home oxygen therapy    "2L 24/7" (08/01/2018)  . OSA on CPAP    "w/O2" (08/01/2018)  . Paroxysmal A-fib ([place])   . PTSD (post-traumatic stress disorder)   . Stroke [facility])    "not sure when; showed up on one of my tests" (08/01/2018)  . TIA (transient ischemic attack)    "several; they say I have small ones" (08/01/2018)  . Urinary incontinence     Assessment: 68 yo female admitted with a COPD exacerbation and chest pain. Pharmacy consulted to restart Coumadin for atrial fibrillation.   15 mg warfarin given 1/19, then warfarin held prior to cath. Home regimen 11.25 mg daily EXCEPT for 7.5 mg on MWF.  INR continues to trend down to 1.14 today after receiving 10 mg vitamin K on 1/21 to bring INR below 1.8 for cardiac cath. Hgb down slightly to 9.4, plt WNL. No s/sx bleeding.     Goal of Therapy:  INR 2-3   Plan:  Order warfarin 15 mg tonight given recent vitamin K administration Daily INR Monitor for s/sx of bleeding  Antonietta Jewel, PharmD, BCCCP Clinical Pharmacist  Pager: [phone] Phone: [phone] 08/10/2018 7:45 AM

## 2018-08-11 ENCOUNTER — Telehealth: Payer: Self-pay

## 2018-08-11 NOTE — Telephone Encounter (Signed)
Called patient's daughter to schedule her hospital f/u she told me to call patient's caregiver Jeani Hawking at [phone] to schedule the appointment due to her being the one to bring her. Left her a v/m to call to schedule appt. YRL,RMA

## 2018-08-12 DIAGNOSIS — J9621 Acute and chronic respiratory failure with hypoxia: Secondary | ICD-10-CM | POA: Diagnosis not present

## 2018-08-12 DIAGNOSIS — F319 Bipolar disorder, unspecified: Secondary | ICD-10-CM | POA: Diagnosis not present

## 2018-08-12 DIAGNOSIS — Z9981 Dependence on supplemental oxygen: Secondary | ICD-10-CM | POA: Diagnosis not present

## 2018-08-12 DIAGNOSIS — Z7901 Long term (current) use of anticoagulants: Secondary | ICD-10-CM | POA: Diagnosis not present

## 2018-08-12 DIAGNOSIS — J441 Chronic obstructive pulmonary disease with (acute) exacerbation: Secondary | ICD-10-CM | POA: Diagnosis not present

## 2018-08-12 DIAGNOSIS — G4733 Obstructive sleep apnea (adult) (pediatric): Secondary | ICD-10-CM | POA: Diagnosis not present

## 2018-08-12 DIAGNOSIS — Z6841 Body Mass Index (BMI) 40.0 and over, adult: Secondary | ICD-10-CM | POA: Diagnosis not present

## 2018-08-12 DIAGNOSIS — E119 Type 2 diabetes mellitus without complications: Secondary | ICD-10-CM | POA: Diagnosis not present

## 2018-08-12 DIAGNOSIS — I480 Paroxysmal atrial fibrillation: Secondary | ICD-10-CM | POA: Diagnosis not present

## 2018-08-14 ENCOUNTER — Telehealth: Payer: Self-pay | Admitting: Internal Medicine

## 2018-08-14 ENCOUNTER — Other Ambulatory Visit: Payer: Self-pay | Admitting: Nurse Practitioner

## 2018-08-14 ENCOUNTER — Ambulatory Visit: Payer: Medicare Other | Admitting: *Deleted

## 2018-08-14 DIAGNOSIS — Z7901 Long term (current) use of anticoagulants: Secondary | ICD-10-CM | POA: Diagnosis not present

## 2018-08-14 DIAGNOSIS — I480 Paroxysmal atrial fibrillation: Secondary | ICD-10-CM | POA: Diagnosis not present

## 2018-08-14 DIAGNOSIS — K219 Gastro-esophageal reflux disease without esophagitis: Secondary | ICD-10-CM

## 2018-08-14 LAB — POCT INR: INR: 1.4 — AB (ref 2.0–3.0)

## 2018-08-14 MED ORDER — PANTOPRAZOLE SODIUM 40 MG PO TBEC: 40.0000 mg | DELAYED_RELEASE_TABLET | Freq: Every day | ORAL | 1 refills | Status: DC

## 2018-08-14 NOTE — Patient Instructions (Signed)
Description   Today take 1.5 tablets, tomorrow take 2 tablets,, then continue taking 1.5 tablet daily except 1 tablets each Monday, Wednesday and Friday  Repeat INR in 10 days.

## 2018-08-14 NOTE — Telephone Encounter (Signed)
Spoke with Michele [name]-pt has been scheduled to see CY for HFU on 08/23/2018 at 11:30am. Nothing more needed at this time.

## 2018-08-17 ENCOUNTER — Encounter: Payer: Self-pay | Admitting: Nurse Practitioner

## 2018-08-17 ENCOUNTER — Ambulatory Visit: Payer: Medicare Other | Admitting: Nurse Practitioner

## 2018-08-17 ENCOUNTER — Telehealth: Payer: Self-pay | Admitting: Internal Medicine

## 2018-08-17 VITALS — BP 110/88 | HR 92 | Temp 98.3°F | Ht 61.8 in | Wt 285.0 lb

## 2018-08-17 DIAGNOSIS — I2510 Atherosclerotic heart disease of native coronary artery without angina pectoris: Secondary | ICD-10-CM

## 2018-08-17 DIAGNOSIS — R7401 Elevation of levels of liver transaminase levels: Secondary | ICD-10-CM

## 2018-08-17 DIAGNOSIS — I480 Paroxysmal atrial fibrillation: Secondary | ICD-10-CM

## 2018-08-17 DIAGNOSIS — Z9981 Dependence on supplemental oxygen: Secondary | ICD-10-CM

## 2018-08-17 DIAGNOSIS — M25542 Pain in joints of left hand: Secondary | ICD-10-CM

## 2018-08-17 DIAGNOSIS — R252 Cramp and spasm: Secondary | ICD-10-CM

## 2018-08-17 DIAGNOSIS — J441 Chronic obstructive pulmonary disease with (acute) exacerbation: Secondary | ICD-10-CM | POA: Diagnosis not present

## 2018-08-17 DIAGNOSIS — I10 Essential (primary) hypertension: Secondary | ICD-10-CM | POA: Diagnosis not present

## 2018-08-17 DIAGNOSIS — M25541 Pain in joints of right hand: Secondary | ICD-10-CM

## 2018-08-17 DIAGNOSIS — G4733 Obstructive sleep apnea (adult) (pediatric): Secondary | ICD-10-CM

## 2018-08-17 DIAGNOSIS — R740 Nonspecific elevation of levels of transaminase and lactic acid dehydrogenase [LDH]: Secondary | ICD-10-CM

## 2018-08-17 DIAGNOSIS — E782 Mixed hyperlipidemia: Secondary | ICD-10-CM

## 2018-08-17 NOTE — Telephone Encounter (Signed)
Spoke with [employer]. She is aware that this order has been placed. Apologized for this not being taken care of when CY wanted it done in October 2019. Nothing further was needed.

## 2018-08-17 NOTE — Telephone Encounter (Signed)
Sheldon Silvan returning phone call Jeani Hawking phone number is [phone].  Please call after 3:30 pm.

## 2018-08-17 NOTE — Telephone Encounter (Signed)
Order for advanced overnight oximetry on CPAP placed with [facility].  LVMTCB x 1 for Michele [name] (on DPR).

## 2018-08-17 NOTE — Progress Notes (Signed)
Subjective:     Patient ID: Michele [name] , female    DOB: 08-Jan-1951 , 68 y.o.   MRN: [id number]   Chief Complaint  Patient presents with  . Hospitalization Follow-up    HPI  Here for hospital follow up after being admitted on 1/14-1/23 for chest pain had cardiac cath.  She had elevated transaminase.  She has Home Health Nurse and PT coming out needs the orders.  She has an appt with Dr. Annamaria Boots (pulmonology) on Wednesday and Cardiology on Thursday for her INR - as follow up.    Breathing has been good since being home, using CPAP and treatments. O2 at 2 l/m.    Appt with behavioral health on Feb 13th - Jessica counselor, Dr. Justine Null on 26th.   Her caregiver comes 3 days a week.  Arthritis  Presents for follow-up visit. She complains of pain and stiffness. She reports no joint swelling or joint warmth. The symptoms have been worsening. Affected location: bilateral hand pain. Pertinent negatives include no diarrhea or fatigue. Compliance with medications is 51-75%. Side effects of treatment include joint pain.     Past Medical History:  Diagnosis Date  . Allergic rhinitis   . ARF (acute renal failure) ([place]) 03/2017  . 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 Asthma   . CAD (coronary artery disease) 01/30/2007   stents trivial coronary artery disease diffusely, with a recent deployment od a intracoronary artery stent, 3.5 x 12 mm driver stent with no more than 20-30% in- stents restenosis.done by Dr [name] with re-look on novenber 10 2008 revealing a widely patent stent with otherwise trival CAD and normal LV function  . Carpal tunnel syndrome, right   . Chest pain 07/17/12009   2 D Echo EF >55%  . Chronic lower back pain   . Complication of anesthesia    slow to wake up  . COPD (chronic obstructive pulmonary disease) ([place])   . DDD (degenerative disc disease), lumbar    multilevel lumbar degenerative disc disease  . Depression   . Diabetes  mellitus type II   . Gait instability   . GERD (gastroesophageal reflux disease)   . History of kidney stones   . History of stomach ulcers    "years ago" (08/01/2018)  . Hyperlipidemia   . Hypertension   . IBS (irritable bowel syndrome)   . Iron deficiency anemia 07/13/2016  . Obesity   . On home oxygen therapy    "2L 24/7" (08/01/2018)  . OSA on CPAP    "w/O2" (08/01/2018)  . Paroxysmal A-fib ([place])   . PTSD (post-traumatic stress disorder)   . Stroke [facility])    "not sure when; showed up on one of my tests" (08/01/2018)  . TIA (transient ischemic attack)    "several; they say I have small ones" (08/01/2018)  . Urinary incontinence      Family History  Problem Relation Age of Onset  . Coronary artery disease Mother   . Coronary artery disease Father   . Depression Father      Current Outpatient Medications:  .  ADVAIR DISKUS 250-50 MCG/DOSE AEPB, INHALE ONE PUFF TWICE DAILY, Disp: 60 each, Rfl: 5 .  albuterol (VENTOLIN HFA) 108 (90 Base) MCG/ACT inhaler, Inhale 2 puffs into the lungs every 6 (six) hours as needed for wheezing or shortness of breath., Disp: , Rfl:  .  amLODipine (NORVASC) 5 MG tablet, Take 1 tablet (5 mg total) by mouth 2 (  two) times daily., Disp: 60 tablet, Rfl: 0 .  ARIPiprazole (ABILIFY) 5 MG tablet, Take 1 tablet (5 mg total) by mouth daily., Disp: 90 tablet, Rfl: 0 .  aspirin EC 81 MG EC tablet, Take 1 tablet (81 mg total) by mouth daily., Disp: , Rfl:  .  FeFum-FePoly-FA-B Cmp-C-Biot (INTEGRA PLUS) CAPS, Take 1 capsule by mouth every morning., Disp: 30 capsule, Rfl: 2 .  gabapentin (NEURONTIN) 300 MG capsule, Take 300 mg by mouth 3 (three) times daily as needed (for pain). , Disp: , Rfl: 0 .  ipratropium-albuterol (DUONEB) 0.5-2.5 (3) MG/3ML SOLN, USE 3 ML IN NEBULIZER EVERY 6 HOURS AS NEEDED DX: J45.20, Disp: 360 mL, Rfl: 5 .  isosorbide mononitrate (IMDUR) 30 MG 24 hr tablet, Take 1 tablet (30 mg total) by mouth daily., Disp: 30 tablet, Rfl: 0 .  MAGNESIUM  GLUCONATE PO, Take by mouth., Disp: , Rfl:  .  metFORMIN (GLUCOPHAGE-XR) 500 MG 24 hr tablet, TAKE ONE (1) TABLET BY MOUTH EVERY DAY WITH BREAKFAST (Patient taking differently: Take 500 mg by mouth daily with breakfast. ), Disp: 90 tablet, Rfl: 3 .  metoprolol tartrate (LOPRESSOR) 25 MG tablet, Take 1 tablet (25 mg total) by mouth 2 (two) times daily., Disp: 180 tablet, Rfl: 3 .  nitroGLYCERIN (NITROSTAT) 0.4 MG SL tablet, PLACE 1 TABLET UNDER THE TONGUE EVERY 5 MINUTES AS NEEDED FOR CHEST PAIN.  MAY REPEAT 3 TIMES.  IF NO RELIEF CALL DOCTOR (Patient taking differently: Place 0.4 mg under the tongue every 5 (five) minutes as needed for chest pain. ), Disp: 25 tablet, Rfl: 11 .  OXYGEN, Inhale 2 L into the lungs as needed (for shortness of breath). , Disp: , Rfl:  .  pantoprazole (PROTONIX) 40 MG tablet, Take 1 tablet (40 mg total) by mouth daily. Take twice a day before meals for 8 weeks, then once a day, Disp: 90 tablet, Rfl: 1 .  rosuvastatin (CRESTOR) 10 MG tablet, TAKE ONE (1) TABLET BY MOUTH EVERY DAY, Disp: 90 tablet, Rfl: 1 .  sertraline (ZOLOFT) 100 MG tablet, Take 2 tablets (200 mg total) by mouth daily., Disp: 180 tablet, Rfl: 0 .  trospium (SANCTURA) 20 MG tablet, Take 20 mg by mouth at bedtime. , Disp: , Rfl:  .  warfarin (COUMADIN) 7.5 MG tablet, TAKE ONE AND ONE-HALF TO TWO TABLETS BY MOUTH DAILY AS DIRECTED (Patient taking differently: Take 7.5-11.25 mg by mouth See admin instructions. Taking one tablet (7.5mg ) on Monday, Wed, Fri. All other days taking one & one-half tablet=11.25mg ), Disp: 150 tablet, Rfl: 0   Allergies  Allergen Reactions  . Meprobamate Nausea And Vomiting     Review of Systems  Constitutional: Negative for fatigue.  Respiratory: Negative for cough.   Cardiovascular: Negative for chest pain, palpitations and leg swelling.  Gastrointestinal: Negative for diarrhea.  Endocrine: Negative for polydipsia, polyphagia and polyuria.  Musculoskeletal: Positive for  arthritis and stiffness. Negative for back pain, joint swelling and myalgias.  Skin: Negative.   Neurological: Negative for dizziness and headaches.     Today's Vitals   08/17/18 1111  BP: 110/88  Pulse: 92  Temp: 98.3 F (36.8 C)  TempSrc: Oral  SpO2: 90%  Weight: 285 lb (129.3 kg)  Height: 5' 1.8" (1.57 m)  PainSc: 6   PainLoc: Hand   Body mass index is 52.47 kg/m.   Objective:  Physical Exam Vitals signs reviewed.  Constitutional:      Appearance: Normal appearance.  Cardiovascular:     Rate  and Rhythm: Normal rate and regular rhythm.     Pulses: Normal pulses.     Heart sounds: Normal heart sounds. No murmur.  Pulmonary:     Effort: Pulmonary effort is normal. No respiratory distress.     Breath sounds: Normal breath sounds.  Musculoskeletal:        General: Tenderness present. No swelling.  Skin:    General: Skin is warm and dry.     Capillary Refill: Capillary refill takes less than 2 seconds.  Neurological:     General: No focal deficit present.     Mental Status: She is alert and oriented to person, place, and time.  Psychiatric:        Mood and Affect: Mood normal.         Assessment And Plan:     1. Muscle cramps  Encouraged to take magnesium sulfate in the evening and to stay well hydrated. - Autoimmune Profile - CK, total  2. Arthralgia of both hands  Complains of pain to both hands will check autoimmune panel. - CK, total  3. Mixed hyperlipidemia  Chronic, controlled  Continue with current medications  4. Essential hypertension . B/P is fair control  . CMP ordered to check renal function.  . The importance of regular exercise and dietary modification was stressed to the patient.  . Stressed importance of losing ten percent of her body weight to help with B/P control.  . The weight loss would help with decreasing cardiac and cancer risk as well.  - CMP14 + Anion Gap  5. AF (paroxysmal atrial fibrillation) (HCC)  Chronic,  controlled  Continue with current medications  Continue follow up with cardiology.  6. Elevated transaminase level  While hospitalized had elevated transaminase will recheck levels today - CMP14 + Anion Gap  7. COPD exacerbation ([place])  Admitted to the hospital on 1/14-1/23 for an COPD exacerbation.  She continues to require oxygen at night and as needed TCM Performed. A member of the clinical team spoke with the patient upon dischare. Discharge summary was reviewed in full detail during the visit. Meds reconciled and compared to discharge meds. Medication list is updated and reviewed with the patient.  Greater than 50% face to face time was spent in counseling an coordination of care.  All questions were answered to the satisfaction of the patient.      Minette Brine, FNP

## 2018-08-18 LAB — CMP14 + ANION GAP
ALT: 51 IU/L — ABNORMAL HIGH (ref 0–32)
AST: 26 IU/L (ref 0–40)
Albumin/Globulin Ratio: 1.4 (ref 1.2–2.2)
Albumin: 4.2 g/dL (ref 3.8–4.8)
Alkaline Phosphatase: 129 IU/L — ABNORMAL HIGH (ref 39–117)
Anion Gap: 19 mmol/L — ABNORMAL HIGH (ref 10.0–18.0)
BUN/Creatinine Ratio: 11 — ABNORMAL LOW (ref 12–28)
BUN: 10 mg/dL (ref 8–27)
Bilirubin Total: <0.2 mg/dL (ref 0.0–1.2)
CO2: 25 mmol/L (ref 20–29)
Calcium: 9.6 mg/dL (ref 8.7–10.3)
Chloride: 99 mmol/L (ref 96–106)
Creatinine, Ser: 0.88 mg/dL (ref 0.57–1.00)
GFR calc non Af Amer: 68 mL/min/{1.73_m2} (ref >59)
GFR, EST AFRICAN AMERICAN: 79 mL/min/{1.73_m2} (ref >59)
Globulin, Total: 3 g/dL (ref 1.5–4.5)
Glucose: 85 mg/dL (ref 65–99)
Potassium: 4.2 mmol/L (ref 3.5–5.2)
Sodium: 143 mmol/L (ref 134–144)
TOTAL PROTEIN: 7.2 g/dL (ref 6.0–8.5)

## 2018-08-18 LAB — AUTOIMMUNE PROFILE
Anti Nuclear Antibody(ANA): NEGATIVE
COMPLEMENT C3, SERUM: 183 mg/dL — AB (ref 82–167)

## 2018-08-18 LAB — CK: Total CK: 79 U/L (ref 24–173)

## 2018-08-23 ENCOUNTER — Ambulatory Visit: Payer: Medicare Other | Admitting: Internal Medicine

## 2018-08-23 ENCOUNTER — Encounter: Payer: Self-pay | Admitting: Internal Medicine

## 2018-08-23 ENCOUNTER — Ambulatory Visit: Payer: Medicare Other | Admitting: Nurse Practitioner

## 2018-08-23 VITALS — BP 140/80 | HR 68 | Ht 61.0 in | Wt 289.4 lb

## 2018-08-23 DIAGNOSIS — J9610 Chronic respiratory failure, unspecified whether with hypoxia or hypercapnia: Secondary | ICD-10-CM

## 2018-08-23 DIAGNOSIS — G4733 Obstructive sleep apnea (adult) (pediatric): Secondary | ICD-10-CM | POA: Diagnosis not present

## 2018-08-23 DIAGNOSIS — J9621 Acute and chronic respiratory failure with hypoxia: Secondary | ICD-10-CM

## 2018-08-23 NOTE — Progress Notes (Signed)
Patient ID: Michele [name], female    DOB: [birth year], 68 y.o.   MRN: [id number]  HPI F never smoker. Followed here for obstructive sleep apnea, allergic rhinitis, Asthma/COPD complicated by obesity/hypotension, GERD and CAD.  NPSG 07/16/16-AHI 56.9/hour, oxygen desaturation 80%, body weight 297 pounds Office Spirometry 04/25/2018-very severe obstructive airways disease-FVC 0.7/30%, FEV1 0.4/23%, ratio 0.60/77% ---------------------------------------------------------------------------------- 06/26/2018- 68 year old female never smoker followed for OSA/failed CPAP, allergic rhinitis, Asthma/COPD, complicated by obesity/hypoventilation, GERD, CAD/stents/warfarin/ AFib, CVA, chronic anemia, depression, DM 2, HBP, O2 2 L  [employer] CPAP/Advanced -not using -----pt c/o increased sob with any exertion, prod cough with brown mucus X2 mos.   Body weight today 283 pounds Advair 250, albuterol HFA,  08/23/2018- 68 year old female never smoker followed for OSA/failed CPAP, allergic rhinitis, Asthma/COPD, complicated by obesity/hypoventilation, GERD, CAD/stents/warfarin/ AFib, CVA, chronic anemia, depression, DM 2, HBP,            Here with "caregiver" O2 2 L  [employer] CPAP auto 5-15/Advanced. Still has, but rarely tries to use it. Hospital follow-up: 1/14-1/23/2020.  Principal diagnosis COPD exacerbation with acute on chronic hypoxic respiratory failure.  Transaminases were elevated but catheterization done, recommending medical management. She asks about a light portable oxygen tank-discussed. Walk test 08/23/2018-room air-3 slow laps using a cane "legs weak".  Some dyspnea and wheeze by end of walk.  Lowest saturation 90%, maximum heart rate 114.  ROS-see HPI + = positive Constitutional:   No-   weight loss, night sweats, fevers, chills, +fatigue, lassitude. HEENT:   No-  headaches, difficulty swallowing, tooth/dental problems, sore throat,       No-sneezing, itching, ear ache, nasal congestion, post  nasal drip,  CV:  No-   chest pain, orthopnea, PND, swelling in lower extremities, anasarca, dizziness, palpitations Resp: +shortness of breath with exertion or at rest.            productive cough,  No non-productive cough,  No- coughing up of blood.              No-   change in color of mucus. No- wheezing.   Skin: No-   rash or lesions. GI:  No-   heartburn, indigestion, abdominal pain, nausea, vomiting,  GU:  MS:  No-   joint pain or swelling.   Neuro-     nothing unusual Psych:  No- change in mood or affect. No acute  depression or anxiety.  No memory loss.  Objective:  General- Alert, Oriented, Affect-appropriate, Distress- none acute; + morbidly obese Skin- . Lymphadenopathy- none Head- atraumatic            Eyes- Gross vision intact, PERRLA, conjunctivae clear secretions            Ears- Hearing, canals-normal            Nose- +clear, no- Septal dev,  polyps, erosion, perforation             Throat- Mallampati III-IV , mucosa clear , drainage- none, tonsils- atrophic; raspy voice Neck- flexible , trachea midline, no stridor , thyroid nl, carotid no bruit Chest - symmetrical excursion , unlabored           Heart/CV- RRR- occ extra beat , no murmur , no gallop  , no rub, nl s1 s2                           - JVD- none , edema- none, stasis changes- none, varices- none  Lung- clear, shallow, wheeze- none, cough- none , dullness-none, rub- none           Chest wall-  Abd-  Br/ Gen/ Rectal- Not done, not indicated Extrem- cyanosis- none, clubbing, none, atrophy- none, strength- nl; +walks with cane Neuro- grossly intact to observation  Assessment & Plan:

## 2018-08-23 NOTE — Patient Instructions (Addendum)
Order- Walk test on room air- O2 qualifying     Dx chronic respiratory failure with hypoxemia  Order- DME [facility]- Continue CPAP auto 5-15, please replace mask of choice- , continue supplies, humidifier, card. Please help her bleed her home O2 into CPAP.  Order- Dme [employer]- please evaluate for lightest available portable O2 2L / min pulse

## 2018-08-24 ENCOUNTER — Encounter: Payer: Self-pay | Admitting: Cardiology

## 2018-08-24 ENCOUNTER — Ambulatory Visit (INDEPENDENT_AMBULATORY_CARE_PROVIDER_SITE_OTHER): Payer: Medicare Other | Admitting: Pharmacist Clinician (PhC)/ Clinical Pharmacy Specialist

## 2018-08-24 ENCOUNTER — Ambulatory Visit: Payer: Medicare Other | Admitting: Cardiology

## 2018-08-24 VITALS — BP 118/82 | HR 72 | Ht 62.0 in | Wt 290.0 lb

## 2018-08-24 DIAGNOSIS — J441 Chronic obstructive pulmonary disease with (acute) exacerbation: Secondary | ICD-10-CM | POA: Diagnosis not present

## 2018-08-24 DIAGNOSIS — Z7901 Long term (current) use of anticoagulants: Secondary | ICD-10-CM

## 2018-08-24 DIAGNOSIS — J9621 Acute and chronic respiratory failure with hypoxia: Secondary | ICD-10-CM | POA: Diagnosis not present

## 2018-08-24 DIAGNOSIS — E785 Hyperlipidemia, unspecified: Secondary | ICD-10-CM

## 2018-08-24 DIAGNOSIS — I739 Peripheral vascular disease, unspecified: Secondary | ICD-10-CM

## 2018-08-24 DIAGNOSIS — I2510 Atherosclerotic heart disease of native coronary artery without angina pectoris: Secondary | ICD-10-CM

## 2018-08-24 DIAGNOSIS — Z9861 Coronary angioplasty status: Secondary | ICD-10-CM

## 2018-08-24 DIAGNOSIS — I480 Paroxysmal atrial fibrillation: Secondary | ICD-10-CM | POA: Diagnosis not present

## 2018-08-24 DIAGNOSIS — I10 Essential (primary) hypertension: Secondary | ICD-10-CM

## 2018-08-24 DIAGNOSIS — F4310 Post-traumatic stress disorder, unspecified: Secondary | ICD-10-CM

## 2018-08-24 DIAGNOSIS — Z6841 Body Mass Index (BMI) 40.0 and over, adult: Secondary | ICD-10-CM

## 2018-08-24 DIAGNOSIS — Z8673 Personal history of transient ischemic attack (TIA), and cerebral infarction without residual deficits: Secondary | ICD-10-CM

## 2018-08-24 DIAGNOSIS — G4733 Obstructive sleep apnea (adult) (pediatric): Secondary | ICD-10-CM

## 2018-08-24 LAB — POCT INR: INR: 2.8 (ref 2.0–3.0)

## 2018-08-24 MED ORDER — HYDROCHLOROTHIAZIDE 25 MG PO TABS: 25.0000 mg | ORAL_TABLET | Freq: Every day | ORAL | 2 refills | Status: DC

## 2018-08-24 MED ORDER — AMLODIPINE BESYLATE 5 MG PO TABS: 5.0000 mg | ORAL_TABLET | Freq: Every day | ORAL | 6 refills | Status: DC

## 2018-08-24 NOTE — Patient Instructions (Signed)
Medication Instructions:  DECREASE Norvasc(Amlodipine) to 5mg  take 1 tablet once a day START Hydrochlorothiazide 12.5mg  Tablet comes in 25mg  break tablet in half and take half tablet once a day If you need a refill on your cardiac medications before your next appointment, please call your pharmacy.   Lab work: None  If you have labs (blood work) drawn today and your tests are completely normal, you will receive your results only by: Marland Kitchen MyChart Message (if you have MyChart) OR . A paper copy in the mail If you have any lab test that is abnormal or we need to change your treatment, we will call you to review the results.  Testing/Procedures: None   Follow-Up: At [facility], you and your health needs are our priority.  As part of our continuing mission to provide you with exceptional heart care, we have created designated Provider Care Teams.  These Care Teams include your primary Cardiologist (physician) and Advanced Practice Providers (APPs -  Physician Assistants and Nurse Practitioners) who all work together to provide you with the care you need, when you need it. . Your physician recommends that you schedule a follow-up appointment in: 4-6 weeks with Kerin Ransom, PA-C .  Any Other Special Instructions Will Be Listed Below (If Applicable).

## 2018-08-24 NOTE — Assessment & Plan Note (Signed)
Controlled.  

## 2018-08-24 NOTE — Assessment & Plan Note (Signed)
Followed by Dr Annamaria Boots- she is supposed to be on C-pap and O2- not sure she is complaint with C-pap

## 2018-08-24 NOTE — Assessment & Plan Note (Addendum)
Holding NSR- on Coumadin. ASA resumed for CAD

## 2018-08-24 NOTE — Assessment & Plan Note (Signed)
Here post hospital- stable

## 2018-08-24 NOTE — Assessment & Plan Note (Signed)
BMI 53 

## 2018-08-24 NOTE — Assessment & Plan Note (Signed)
RCA PCI with stent 2008-  cath 08/09/2018- 80% ISR and diffuse moderate RCA disease and distal LAD disease. Plan is for medical Rx.

## 2018-08-24 NOTE — Progress Notes (Signed)
08/24/2018 Michele [name]   12-04-1950  [id number]  Primary Physician [name], MD Primary Cardiologist: Dr Gwenlyn Found  HPI: Patient is a pleasant 68 year old female followed by Dr. Gwenlyn Found.  She has a history of coronary disease.  She had prior RCA stenting.  She has diabetes, hypertension, morbid obesity, sleep apnea on oxygen and CPAP, PAF on Coumadin and arthritis.  She was admitted to the hospital August 01, 2018 with respiratory failure and chest pain.  Her EKG showed sinus rhythm without acute changes, her troponins were negative.  Myoview on August 06, 2018 was abnormal and she ultimately underwent diagnostic catheterization August 09, 2018.  Catheterization revealed diffuse RCA disease.  She has a stent in the proximal RCA with an 80% stenosis.  There is diffuse 60 to 70% stenosis after this.  The RCA is tortuous.  The distal LAD was 80% stenosed.  The OM1 was 50% narrowed in the mid circumflex 40% narrowed.  LV function was mildly depressed with an EF of 45% to 50%.  Plan is for medical therapy.  Nitrates were added, low-dose aspirin resumed, and amlodipine added.  Her acute respiratory issues were addressed.  She is in the office today for follow-up.  She denies chest pain.  She saw Dr. Annamaria Boots yesterday.  She is to start oxygen at night as well as CPAP.  She feels like she is retaining fluid since discharge and says her weight is up 10 pounds.   Current Outpatient Medications  Medication Sig Dispense Refill  . ADVAIR DISKUS 250-50 MCG/DOSE AEPB INHALE ONE PUFF TWICE DAILY 60 each 5  . albuterol (VENTOLIN HFA) 108 (90 Base) MCG/ACT inhaler Inhale 2 puffs into the lungs every 6 (six) hours as needed for wheezing or shortness of breath.    Marland Kitchen amLODipine (NORVASC) 5 MG tablet Take 1 tablet (5 mg total) by mouth daily. 30 tablet 6  . ARIPiprazole (ABILIFY) 5 MG tablet Take 1 tablet (5 mg total) by mouth daily. 90 tablet 0  . aspirin EC 81 MG EC tablet Take 1 tablet (81 mg  total) by mouth daily.    Marland Kitchen FeFum-FePoly-FA-B Cmp-C-Biot (INTEGRA PLUS) CAPS Take 1 capsule by mouth every morning. 30 capsule 2  . gabapentin (NEURONTIN) 300 MG capsule Take 300 mg by mouth 3 (three) times daily as needed (for pain).   0  . ipratropium-albuterol (DUONEB) 0.5-2.5 (3) MG/3ML SOLN USE 3 ML IN NEBULIZER EVERY 6 HOURS AS NEEDED DX: J45.20 360 mL 5  . isosorbide mononitrate (IMDUR) 30 MG 24 hr tablet Take 1 tablet (30 mg total) by mouth daily. 30 tablet 0  . MAGNESIUM GLUCONATE PO Take by mouth.    . metFORMIN (GLUCOPHAGE-XR) 500 MG 24 hr tablet TAKE ONE (1) TABLET BY MOUTH EVERY DAY WITH BREAKFAST (Patient taking differently: Take 500 mg by mouth daily with breakfast. ) 90 tablet 3  . metoprolol tartrate (LOPRESSOR) 25 MG tablet Take 1 tablet (25 mg total) by mouth 2 (two) times daily. 180 tablet 3  . OXYGEN Inhale 2 L into the lungs as needed (for shortness of breath).     . pantoprazole (PROTONIX) 40 MG tablet Take 1 tablet (40 mg total) by mouth daily. Take twice a day before meals for 8 weeks, then once a day 90 tablet 1  . rosuvastatin (CRESTOR) 10 MG tablet TAKE ONE (1) TABLET BY MOUTH EVERY DAY 90 tablet 1  . sertraline (ZOLOFT) 100 MG tablet Take 2 tablets (200 mg total) by mouth daily.  180 tablet 0  . warfarin (COUMADIN) 7.5 MG tablet TAKE ONE AND ONE-HALF TO TWO TABLETS BY MOUTH DAILY AS DIRECTED (Patient taking differently: Take 7.5-11.25 mg by mouth See admin instructions. Taking one tablet (7.5mg ) on Monday, Wed, Fri. All other days taking one & one-half tablet=11.25mg ) 150 tablet 0  . hydrochlorothiazide (HYDRODIURIL) 25 MG tablet Take 1 tablet (25 mg total) by mouth daily. 30 tablet 2  . nitroGLYCERIN (NITROSTAT) 0.4 MG SL tablet PLACE 1 TABLET UNDER THE TONGUE EVERY 5 MINUTES AS NEEDED FOR CHEST PAIN.  MAY REPEAT 3 TIMES.  IF NO RELIEF CALL DOCTOR (Patient not taking: No sig reported) 25 tablet 11   No current facility-administered medications for this visit.      Allergies  Allergen Reactions  . Meprobamate Nausea And Vomiting    Past Medical History:  Diagnosis Date  . Allergic rhinitis   . ARF (acute renal failure) ([place]) 03/2017  . 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 Asthma   . CAD (coronary artery disease) 01/30/2007   stents trivial coronary artery disease diffusely, with a recent deployment od a intracoronary artery stent, 3.5 x 12 mm driver stent with no more than 20-30% in- stents restenosis.done by Dr [name] with re-look on novenber 10 2008 revealing a widely patent stent with otherwise trival CAD and normal LV function  . Carpal tunnel syndrome, right   . Chest pain 07/17/12009   2 D Echo EF >55%  . Chronic lower back pain   . Complication of anesthesia    slow to wake up  . COPD (chronic obstructive pulmonary disease) ([place])   . DDD (degenerative disc disease), lumbar    multilevel lumbar degenerative disc disease  . Depression   . Diabetes mellitus type II   . Gait instability   . GERD (gastroesophageal reflux disease)   . History of kidney stones   . History of stomach ulcers    "years ago" (08/01/2018)  . Hyperlipidemia   . Hypertension   . IBS (irritable bowel syndrome)   . Iron deficiency anemia 07/13/2016  . Obesity   . On home oxygen therapy    "2L 24/7" (08/01/2018)  . OSA on CPAP    "w/O2" (08/01/2018)  . Paroxysmal A-fib ([place])   . PTSD (post-traumatic stress disorder)   . Stroke [facility])    "not sure when; showed up on one of my tests" (08/01/2018)  . TIA (transient ischemic attack)    "several; they say I have small ones" (08/01/2018)  . Urinary incontinence     Social History   Socioeconomic History  . Marital status: Divorced    Spouse name: Not on file  . Number of children: 2  . Years of education: 58  . Highest education level: Not on file  Occupational History  . 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 Financial  resource strain: Not on file  . Food insecurity:    Worry: Not on file    Inability: Not on file  . Transportation needs:    Medical: Not on file    Non-medical: Not on file  Tobacco Use  . Smoking status: Never Smoker  . Smokeless tobacco: Never Used  Substance and Sexual Activity  . Alcohol use: No    Alcohol/week: 0.0 standard drinks  . Drug use: No  . Sexual activity: Not Currently  Lifestyle  . Physical activity:  Days per week: Not on file    Minutes per session: Not on file  . Stress: Not on file  Relationships  . Social connections:    Talks on phone: Not on file    Gets together: Not on file    Attends religious service: Not on file    Active member of club or organization: Not on file    Attends meetings of clubs or organizations: Not on file    Relationship status: Not on file  . Intimate partner violence:    Fear of current or ex partner: Not on file    Emotionally abused: Not on file    Physically abused: Not on file    Forced sexual activity: Not on file  Other Topics Concern  . Not on file  Social History Narrative   Lives at home alone.   Right-handed.   Occasional caffeine use.   Retired [employer] since 2014. (Rehab).       Family History  Problem Relation Age of Onset  . Coronary artery disease Mother   . Coronary artery disease Father   . Depression Father      Review of Systems: General: negative for chills, fever, night sweats or weight changes.  Cardiovascular: negative for chest pain, dyspnea on exertion, edema, orthopnea, palpitations, paroxysmal nocturnal dyspnea or shortness of breath Dermatological: negative for rash Respiratory: negative for cough or wheezing Urologic: negative for hematuria Abdominal: negative for nausea, vomiting, diarrhea, bright red blood per rectum, melena, or hematemesis Neurologic: negative for visual changes, syncope, or dizziness All other systems reviewed and are otherwise negative except as noted  above.    Blood pressure 118/82, pulse 72, height 5\' 2"  (1.575 m), weight 290 lb (131.5 kg).  General appearance: alert, cooperative, no distress and morbidly obese Lungs: clear to auscultation bilaterally Heart: regular rate and rhythm Extremities: no edema Neurologic: Grossly normal  EKG NSR, poor anterior RW  ASSESSMENT AND PLAN:   Acute on chronic respiratory failure with hypoxia (HCC) Here post hospital- stable  Morbid obesity ([place]) BMI 53  CAD S/P percutaneous coronary angioplasty RCA PCI with stent 2008-  cath 08/09/2018- 80% ISR and diffuse moderate RCA disease and distal LAD disease. Plan is for medical Rx.  Essential hypertension Controlled  Obstructive sleep apnea Followed by Dr Annamaria Boots- she is supposed to be on C-pap and O2- not sure she is complaint with C-pap  Paroxysmal A-fib ([place]) Holding NSR- on Coumadin. ASA resumed for CAD   PLAN  Decrease Norvasc to 5 mg daily, add HCTZ 12.5 mg.  F/U in 4-6 weeks.   Kerin Ransom PA-C 08/24/2018 10:54 AM

## 2018-08-25 ENCOUNTER — Ambulatory Visit: Payer: Medicare Other | Admitting: Nurse Practitioner

## 2018-08-25 MED ORDER — HYDROCHLOROTHIAZIDE 25 MG PO TABS: 12.5000 mg | ORAL_TABLET | Freq: Every day | ORAL | 2 refills | Status: DC

## 2018-08-25 NOTE — Addendum Note (Signed)
Addended by: Ulice Brilliant T on: 08/25/2018 02:37 PM   Modules accepted: Orders

## 2018-08-27 NOTE — Assessment & Plan Note (Signed)
Noncompliant with CPAP.  Coached again.  She thinks she would do better with a full facemask and we will try to make that change Education done including her caregiver, who does not spend the night.. Plan-full facemask, auto 5-15

## 2018-08-27 NOTE — Assessment & Plan Note (Signed)
Weight loss is not her priority now but is strongly advised.

## 2018-08-27 NOTE — Assessment & Plan Note (Signed)
Her greatest limitation is morbid obesity with deconditioning.  We will try to get her a light portable oxygen system based on previous diagnosis.

## 2018-08-31 ENCOUNTER — Encounter (HOSPITAL_COMMUNITY): Payer: Self-pay | Admitting: Licensed Clinical Social Worker

## 2018-08-31 ENCOUNTER — Ambulatory Visit (INDEPENDENT_AMBULATORY_CARE_PROVIDER_SITE_OTHER): Payer: Medicare Other | Admitting: Licensed Clinical Social Worker

## 2018-08-31 DIAGNOSIS — F331 Major depressive disorder, recurrent, moderate: Secondary | ICD-10-CM

## 2018-08-31 NOTE — Progress Notes (Signed)
   THERAPIST PROGRESS NOTE  Session Time: 1:30pm-2:20pm  Participation Level: Active  Behavioral Response: NeatAlertDepressed  Type of Therapy: Individual Therapy  Treatment Goals addressed: Improve Psychiatric Symptoms, elevate mood (increased self-esteem, increased self-compassion, increased interaction), improve unhelpful thought patterns, controlled behavior, moderate mood, deliberate speech and thought process(improved social functioning, healthy adjustment to living situation), Learn about diagnosis, healthy coping skills  Interventions: Motivational Interviewing, CBT, Grounding & Mindfulness Techniques, psychoeducation  Summary: Michele [name] is a 68 y.o. female who presents with Major Depressive Disorder, recurrent episode, moderate  Suicidal/Homicidal: No - without intent/plan  Therapist Response: Michele [name] met with clinician for an individual session.  Michele [name] discussed her psychiatric symptoms, her current life events and her homework. Michele [name] shared taht she has been feeling a bit low over the past week, possibly due to dreary weather. Clinician explored other triggers, as well as physical health. Clinician discussed ways to improve mood, even on dreary days. Clinician utilized CBT to process thoughts, feelings, and behaviors. Clinician discussed recent hospitalization for COPD and reviewed health. Clinician discussed the connection between physical health and emotional health. Michele [name] processed relationships at home and identified that her daughter has done everything possible to make things comfortable at the house. However, Michele [name] continues to struggle with being dependent on her daughter.   Plan: Return again in 4 weeks  Diagnosis:     Axis I: Major Depressive Disorder, recurrent episode, moderate    Michele Curling, LCSW 08/31/2018

## 2018-09-01 ENCOUNTER — Other Ambulatory Visit: Payer: Self-pay | Admitting: Nurse Practitioner

## 2018-09-01 MED ORDER — METFORMIN HCL ER 500 MG PO TB24: ORAL_TABLET | ORAL | 1 refills | Status: DC

## 2018-09-02 DIAGNOSIS — R0902 Hypoxemia: Secondary | ICD-10-CM | POA: Diagnosis not present

## 2018-09-02 DIAGNOSIS — J449 Chronic obstructive pulmonary disease, unspecified: Secondary | ICD-10-CM | POA: Diagnosis not present

## 2018-09-03 ENCOUNTER — Other Ambulatory Visit (HOSPITAL_COMMUNITY): Payer: Self-pay | Admitting: Psychiatry

## 2018-09-03 ENCOUNTER — Telehealth: Payer: Self-pay | Admitting: Pulmonary Disease

## 2018-09-03 DIAGNOSIS — F331 Major depressive disorder, recurrent, moderate: Secondary | ICD-10-CM

## 2018-09-03 MED ORDER — PREDNISONE 20 MG PO TABS: 40.0000 mg | ORAL_TABLET | Freq: Every day | ORAL | 0 refills | Status: AC

## 2018-09-03 NOTE — Telephone Encounter (Signed)
Call to via answering service for increasing cough without change in sputum.  Dyspnea is stable.  No sick contacts.  No other symptoms.  We will prescribe a short course of prednisone. Patient was advised to make a follow-up appointment through our office tomorrow and to proceed to the emergency department if she fails to improve and becomes significantly short of breath.  Kipp Brood, MD [facility] ICU Physician [place]  Pager: [phone] Mobile: [phone] After hours: [phone].  09/03/2018, 12:31 PM

## 2018-09-04 ENCOUNTER — Ambulatory Visit: Payer: Medicare Other | Admitting: *Deleted

## 2018-09-04 DIAGNOSIS — I480 Paroxysmal atrial fibrillation: Secondary | ICD-10-CM

## 2018-09-04 DIAGNOSIS — Z7901 Long term (current) use of anticoagulants: Secondary | ICD-10-CM | POA: Diagnosis not present

## 2018-09-04 LAB — POCT INR: INR: 2.8 (ref 2.0–3.0)

## 2018-09-04 NOTE — Patient Instructions (Signed)
Description   Today and tomorrow take 1 tablet, then Continue taking 1.5 tablet daily except 1 tablets each Monday, Wednesday and Friday  Repeat INR in 3 weeks. Call us with any medication changes or concerns [phone].

## 2018-09-08 NOTE — Assessment & Plan Note (Signed)
Acute bronchitis exacerbation Plan-refill prescription DuoNeb for her home nebulizer machine.  Nebulizer treatment here with Xopenex today, Augmentin

## 2018-09-08 NOTE — Assessment & Plan Note (Signed)
Noncompliant and not motivated.  I suggested we reevaluate with home sleep test this summer.

## 2018-09-08 NOTE — Assessment & Plan Note (Signed)
She has not managed to change lifestyle sufficiently to accomplish useful weight loss.  Encouraged to try.

## 2018-09-11 DIAGNOSIS — Z9981 Dependence on supplemental oxygen: Secondary | ICD-10-CM | POA: Diagnosis not present

## 2018-09-11 DIAGNOSIS — Z6841 Body Mass Index (BMI) 40.0 and over, adult: Secondary | ICD-10-CM | POA: Diagnosis not present

## 2018-09-11 DIAGNOSIS — J441 Chronic obstructive pulmonary disease with (acute) exacerbation: Secondary | ICD-10-CM | POA: Diagnosis not present

## 2018-09-11 DIAGNOSIS — G4733 Obstructive sleep apnea (adult) (pediatric): Secondary | ICD-10-CM | POA: Diagnosis not present

## 2018-09-11 DIAGNOSIS — Z7901 Long term (current) use of anticoagulants: Secondary | ICD-10-CM | POA: Diagnosis not present

## 2018-09-11 DIAGNOSIS — F319 Bipolar disorder, unspecified: Secondary | ICD-10-CM | POA: Diagnosis not present

## 2018-09-11 DIAGNOSIS — E119 Type 2 diabetes mellitus without complications: Secondary | ICD-10-CM | POA: Diagnosis not present

## 2018-09-11 DIAGNOSIS — J9621 Acute and chronic respiratory failure with hypoxia: Secondary | ICD-10-CM | POA: Diagnosis not present

## 2018-09-11 DIAGNOSIS — I480 Paroxysmal atrial fibrillation: Secondary | ICD-10-CM | POA: Diagnosis not present

## 2018-09-12 ENCOUNTER — Other Ambulatory Visit (HOSPITAL_COMMUNITY): Payer: Self-pay | Admitting: Cardiology

## 2018-09-13 ENCOUNTER — Ambulatory Visit (INDEPENDENT_AMBULATORY_CARE_PROVIDER_SITE_OTHER): Payer: Medicare Other | Admitting: Psychiatry

## 2018-09-13 ENCOUNTER — Encounter (HOSPITAL_COMMUNITY): Payer: Self-pay | Admitting: Psychiatry

## 2018-09-13 DIAGNOSIS — F331 Major depressive disorder, recurrent, moderate: Secondary | ICD-10-CM

## 2018-09-13 DIAGNOSIS — F4310 Post-traumatic stress disorder, unspecified: Secondary | ICD-10-CM

## 2018-09-13 MED ORDER — SERTRALINE HCL 100 MG PO TABS: 200.0000 mg | ORAL_TABLET | Freq: Every day | ORAL | 0 refills | Status: DC

## 2018-09-13 MED ORDER — BENZTROPINE MESYLATE 0.5 MG PO TABS: 0.5000 mg | ORAL_TABLET | Freq: Every day | ORAL | 0 refills | Status: DC

## 2018-09-13 MED ORDER — ARIPIPRAZOLE 5 MG PO TABS: 5.0000 mg | ORAL_TABLET | Freq: Every day | ORAL | 0 refills | Status: DC

## 2018-09-13 NOTE — Addendum Note (Signed)
Addended by: Otilio Miu on: 09/13/2018 11:09 AM   Modules accepted: Orders
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
## 2018-09-15 ENCOUNTER — Telehealth (HOSPITAL_COMMUNITY): Payer: Self-pay

## 2018-09-15 NOTE — Telephone Encounter (Signed)
Prior authorization submitted for Benztropin 0.5mg  tabs to [facility] and will receive a determination in 3 business days.

## 2018-09-16 ENCOUNTER — Telehealth (HOSPITAL_COMMUNITY): Payer: Self-pay

## 2018-09-16 NOTE — Telephone Encounter (Signed)
A prior authorization was completed for the medication Benztropine 0.5 mg tab but it was denied.  Is there another medication that can be prescribed for the patient?

## 2018-09-18 ENCOUNTER — Encounter: Payer: Self-pay | Admitting: Nurse Practitioner

## 2018-09-18 NOTE — Telephone Encounter (Signed)
Please try it again with a diagnosis of extraparametal side effects and tremors.

## 2018-09-18 NOTE — Telephone Encounter (Signed)
It was denied because of her diagnosis of MDD

## 2018-09-18 NOTE — Telephone Encounter (Signed)
Why prior authorization was denied for benztropine 0.5 mg?  She used to take it and it is generic.

## 2018-09-18 NOTE — Telephone Encounter (Signed)
Called [facility] and the cash price for generic  3 month supply is $19.65.  Called a patient and she  was happy with that price, so pharmacy is preparing the medication for pick up

## 2018-09-22 ENCOUNTER — Other Ambulatory Visit: Payer: Self-pay

## 2018-09-22 ENCOUNTER — Emergency Department (HOSPITAL_COMMUNITY)
Admission: EM | Admit: 2018-09-22 | Discharge: 2018-09-22 | Disposition: A | Discharge status: Home / Self Care | Payer: Medicare Other | Attending: Emergency Medicine | Admitting: Emergency Medicine

## 2018-09-22 DIAGNOSIS — Z5321 Procedure and treatment not carried out due to patient leaving prior to being seen by health care provider: Secondary | ICD-10-CM | POA: Insufficient documentation

## 2018-09-22 DIAGNOSIS — K148 Other diseases of tongue: Secondary | ICD-10-CM | POA: Diagnosis not present

## 2018-09-22 LAB — CBC
HCT: 40.5 % (ref 36.0–46.0)
Hemoglobin: 11.3 g/dL — ABNORMAL LOW (ref 12.0–15.0)
MCH: 20.4 pg — ABNORMAL LOW (ref 26.0–34.0)
MCHC: 27.9 g/dL — ABNORMAL LOW (ref 30.0–36.0)
MCV: 73.2 fL — ABNORMAL LOW (ref 80.0–100.0)
Platelets: 276 10*3/uL (ref 150–400)
RBC: 5.53 MIL/uL — ABNORMAL HIGH (ref 3.87–5.11)
RDW: 19.3 % — ABNORMAL HIGH (ref 11.5–15.5)
WBC: 7.5 10*3/uL (ref 4.0–10.5)
nRBC: 0 % (ref 0.0–0.2)

## 2018-09-22 LAB — PROTIME-INR
INR: 2.8 — AB (ref 0.8–1.2)
PROTHROMBIN TIME: 29 s — AB (ref 11.4–15.2)

## 2018-09-22 NOTE — ED Triage Notes (Signed)
Patient c/o mouth bleeding - gauze held on patient's tongue - when removed, bleeding noted to front of tongue. Patient states she woke up an hour ago and noticed a blood clot/some blood around her face, as if she bit her tongue while sleeping - on coumadin.

## 2018-09-22 NOTE — ED Notes (Signed)
Pt decided to leave.  Encouraged her to stay and she declined.

## 2018-09-22 NOTE — ED Notes (Signed)
Pt wanting to leave due to wait.  Encouraged her to wait to see EDP.  States she wants [place] to call results.  Informed her that we do not call results and explained process.

## 2018-09-26 DIAGNOSIS — K5900 Constipation, unspecified: Secondary | ICD-10-CM | POA: Diagnosis not present

## 2018-09-26 DIAGNOSIS — D509 Iron deficiency anemia, unspecified: Secondary | ICD-10-CM | POA: Diagnosis not present

## 2018-09-26 DIAGNOSIS — K219 Gastro-esophageal reflux disease without esophagitis: Secondary | ICD-10-CM | POA: Diagnosis not present

## 2018-09-27 ENCOUNTER — Inpatient Hospital Stay: Payer: Medicare Other | Attending: Internal Medicine

## 2018-09-27 ENCOUNTER — Telehealth: Payer: Self-pay | Admitting: Internal Medicine

## 2018-09-27 ENCOUNTER — Encounter: Payer: Self-pay | Admitting: Internal Medicine

## 2018-09-27 ENCOUNTER — Inpatient Hospital Stay: Payer: Medicare Other | Admitting: Internal Medicine

## 2018-09-27 ENCOUNTER — Other Ambulatory Visit: Payer: Self-pay

## 2018-09-27 ENCOUNTER — Encounter: Payer: Self-pay | Admitting: Cardiology

## 2018-09-27 ENCOUNTER — Ambulatory Visit (INDEPENDENT_AMBULATORY_CARE_PROVIDER_SITE_OTHER): Payer: Medicare Other | Admitting: Pharmacist

## 2018-09-27 ENCOUNTER — Other Ambulatory Visit: Payer: Self-pay | Admitting: Internal Medicine

## 2018-09-27 ENCOUNTER — Ambulatory Visit: Payer: Medicare Other | Admitting: Cardiology

## 2018-09-27 VITALS — BP 118/70 | HR 78 | Ht 62.0 in | Wt 290.0 lb

## 2018-09-27 VITALS — BP 141/74 | HR 84 | Temp 99.1°F | Resp 20 | Ht 62.0 in | Wt 290.0 lb

## 2018-09-27 DIAGNOSIS — I2510 Atherosclerotic heart disease of native coronary artery without angina pectoris: Secondary | ICD-10-CM | POA: Diagnosis not present

## 2018-09-27 DIAGNOSIS — E785 Hyperlipidemia, unspecified: Secondary | ICD-10-CM | POA: Diagnosis not present

## 2018-09-27 DIAGNOSIS — Z9861 Coronary angioplasty status: Secondary | ICD-10-CM

## 2018-09-27 DIAGNOSIS — I480 Paroxysmal atrial fibrillation: Secondary | ICD-10-CM

## 2018-09-27 DIAGNOSIS — Z7901 Long term (current) use of anticoagulants: Secondary | ICD-10-CM

## 2018-09-27 DIAGNOSIS — D509 Iron deficiency anemia, unspecified: Secondary | ICD-10-CM

## 2018-09-27 DIAGNOSIS — I5032 Chronic diastolic (congestive) heart failure: Secondary | ICD-10-CM

## 2018-09-27 DIAGNOSIS — I10 Essential (primary) hypertension: Secondary | ICD-10-CM

## 2018-09-27 LAB — CBC WITH DIFFERENTIAL (CANCER CENTER ONLY)
Abs Immature Granulocytes: 0.02 10*3/uL (ref 0.00–0.07)
Basophils Absolute: 0.1 10*3/uL (ref 0.0–0.1)
Basophils Relative: 1 %
Eosinophils Absolute: 0.5 10*3/uL (ref 0.0–0.5)
Eosinophils Relative: 9 %
HCT: 36.5 % (ref 36.0–46.0)
HEMOGLOBIN: 10.7 g/dL — AB (ref 12.0–15.0)
Immature Granulocytes: 0 %
Lymphocytes Relative: 28 %
Lymphs Abs: 1.7 10*3/uL (ref 0.7–4.0)
MCH: 21.1 pg — ABNORMAL LOW (ref 26.0–34.0)
MCHC: 29.3 g/dL — AB (ref 30.0–36.0)
MCV: 72 fL — ABNORMAL LOW (ref 80.0–100.0)
Monocytes Absolute: 0.4 10*3/uL (ref 0.1–1.0)
Monocytes Relative: 7 %
Neutro Abs: 3.4 10*3/uL (ref 1.7–7.7)
Neutrophils Relative %: 55 %
Platelet Count: 278 10*3/uL (ref 150–400)
RBC: 5.07 MIL/uL (ref 3.87–5.11)
RDW: 19.2 % — ABNORMAL HIGH (ref 11.5–15.5)
WBC Count: 6.1 10*3/uL (ref 4.0–10.5)
nRBC: 0 % (ref 0.0–0.2)

## 2018-09-27 LAB — IRON AND TIBC
Iron: 70 ug/dL (ref 41–142)
Saturation Ratios: 25 % (ref 21–57)
TIBC: 284 ug/dL (ref 236–444)
UIBC: 214 ug/dL (ref 120–384)

## 2018-09-27 LAB — FERRITIN: Ferritin: 367 ng/mL — ABNORMAL HIGH (ref 11–307)

## 2018-09-27 LAB — POCT INR: INR: 1.9 — AB (ref 2.0–3.0)

## 2018-09-27 NOTE — Progress Notes (Signed)
09/27/2018 Manuel Garcia   [birth year]  [id number]  Primary Physician [name], MD Primary Cardiologist: Dr Gwenlyn Found  HPI:  Pleasant 68 year old female followed by Dr. Gwenlyn Found.  She has a history of coronary disease.  She had prior RCA stenting.  She has diabetes, hypertension, morbid obesity (BMI 53), sleep apnea on CPAP, PAF on Coumadin, and arthritis in her knees and back.    She was admitted to the hospital 1/14/ 2020 with respiratory failure and chest pain.  Her EKG showed sinus rhythm without acute changes, her troponins were negative.  Myoview on August 06, 2018 was abnormal and she ultimately underwent diagnostic catheterization August 09, 2018.  Catheterization revealed diffuse RCA disease.  She has a stent in the proximal RCA with an 80% stenosis.  There is diffuse 60 to 70% stenosis after this.  The RCA is tortuous.  The distal LAD was 80% stenosed.  The OM1 was 50% narrowed in the mid circumflex 40% narrowed.  LV function was mildly depressed with an EF of 45% to 50%.  Plan is for medical therapy.  Nitrates were added, low-dose aspirin resumed, and amlodipine added.  Her acute respiratory issues were addressed.  She was sen in the office 08/24/2018.  She c/o some edema and weight gain.  I decreased her Norvasc and added HCTZ. She returns today for follow up.     Current Outpatient Medications  Medication Sig Dispense Refill  . ADVAIR DISKUS 250-50 MCG/DOSE AEPB INHALE ONE PUFF TWICE DAILY 60 each 5  . albuterol (VENTOLIN HFA) 108 (90 Base) MCG/ACT inhaler Inhale 2 puffs into the lungs every 6 (six) hours as needed for wheezing or shortness of breath.    Marland Kitchen amLODipine (NORVASC) 5 MG tablet Take 1 tablet (5 mg total) by mouth daily. 30 tablet 6  . ARIPiprazole (ABILIFY) 5 MG tablet Take 1 tablet (5 mg total) by mouth daily. 90 tablet 0  . aspirin EC 81 MG EC tablet Take 1 tablet (81 mg total) by mouth daily.    . benztropine (COGENTIN) 0.5 MG tablet Take 1 tablet (0.5  mg total) by mouth at bedtime. 90 tablet 0  . FeFum-FePoly-FA-B Cmp-C-Biot (INTEGRA PLUS) CAPS Take 1 capsule by mouth every morning. 30 capsule 2  . gabapentin (NEURONTIN) 300 MG capsule Take 300 mg by mouth 3 (three) times daily as needed (for pain).   0  . ipratropium-albuterol (DUONEB) 0.5-2.5 (3) MG/3ML SOLN USE 3 ML IN NEBULIZER EVERY 6 HOURS AS NEEDED DX: J45.20 360 mL 5  . isosorbide mononitrate (IMDUR) 30 MG 24 hr tablet TAKE 1 TABLET (30 MG TOTAL) BY MOUTH DAILY. 30 tablet 0  . MAGNESIUM GLUCONATE PO Take by mouth.    . metFORMIN (GLUCOPHAGE-XR) 500 MG 24 hr tablet TAKE ONE (1) TABLET BY MOUTH EVERY DAY WITH BREAKFAST 90 tablet 1  . metoprolol tartrate (LOPRESSOR) 25 MG tablet Take 1 tablet (25 mg total) by mouth 2 (two) times daily. 180 tablet 3  . nitroGLYCERIN (NITROSTAT) 0.4 MG SL tablet PLACE 1 TABLET UNDER THE TONGUE EVERY 5 MINUTES AS NEEDED FOR CHEST PAIN.  MAY REPEAT 3 TIMES.  IF NO RELIEF CALL DOCTOR 25 tablet 11  . OXYGEN Inhale 2 L into the lungs as needed (for shortness of breath).     . pantoprazole (PROTONIX) 40 MG tablet Take 1 tablet (40 mg total) by mouth daily. Take twice a day before meals for 8 weeks, then once a day 90 tablet 1  . rosuvastatin (CRESTOR)  10 MG tablet TAKE ONE (1) TABLET BY MOUTH EVERY DAY 90 tablet 1  . sertraline (ZOLOFT) 100 MG tablet Take 2 tablets (200 mg total) by mouth daily. 180 tablet 0  . warfarin (COUMADIN) 7.5 MG tablet TAKE ONE AND ONE-HALF TO TWO TABLETS BY MOUTH DAILY AS DIRECTED (Patient taking differently: Take 7.5-11.25 mg by mouth See admin instructions. Taking one tablet (7.5mg ) on Monday, Wed, Fri. All other days taking one & one-half tablet=11.25mg ) 150 tablet 0  . hydrochlorothiazide (HYDRODIURIL) 25 MG tablet Take 0.5 tablets (12.5 mg total) by mouth daily for 30 days. 30 tablet 2   No current facility-administered medications for this visit.     Allergies  Allergen Reactions  . Meprobamate Nausea And Vomiting    Past  Medical History:  Diagnosis Date  . Allergic rhinitis   . ARF (acute renal failure) ([place]) 03/2017  . 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 Asthma   . CAD (coronary artery disease) 01/30/2007   stents trivial coronary artery disease diffusely, with a recent deployment od a intracoronary artery stent, 3.5 x 12 mm driver stent with no more than 20-30% in- stents restenosis.done by Dr [name] with re-look on novenber 10 2008 revealing a widely patent stent with otherwise trival CAD and normal LV function  . Carpal tunnel syndrome, right   . Chest pain 07/17/12009   2 D Echo EF >55%  . Chronic lower back pain   . Complication of anesthesia    slow to wake up  . COPD (chronic obstructive pulmonary disease) ([place])   . DDD (degenerative disc disease), lumbar    multilevel lumbar degenerative disc disease  . Depression   . Diabetes mellitus type II   . Gait instability   . GERD (gastroesophageal reflux disease)   . History of kidney stones   . History of stomach ulcers    "years ago" (08/01/2018)  . Hyperlipidemia   . Hypertension   . IBS (irritable bowel syndrome)   . Iron deficiency anemia 07/13/2016  . Obesity   . On home oxygen therapy    "2L 24/7" (08/01/2018)  . OSA on CPAP    "w/O2" (08/01/2018)  . Paroxysmal A-fib ([place])   . PTSD (post-traumatic stress disorder)   . Stroke [facility])    "not sure when; showed up on one of my tests" (08/01/2018)  . TIA (transient ischemic attack)    "several; they say I have small ones" (08/01/2018)  . Urinary incontinence     Social History   Socioeconomic History  . Marital status: Divorced    Spouse name: Not on file  . Number of children: 2  . Years of education: 77  . Highest education level: Not on file  Occupational History  . Occupation: NURSING [place]    Employer: [employer]    Comment: Retired  Scientific laboratory technician  . Financial resource strain: Not on file  . Food insecurity:    Worry: Not on file    Inability:  Not on file  . Transportation needs:    Medical: Not on file    Non-medical: Not on file  Tobacco Use  . Smoking status: Never Smoker  . Smokeless tobacco: Never Used  Substance and Sexual Activity  . Alcohol use: No    Alcohol/week: 0.0 standard drinks  . Drug use: No  . Sexual activity: Not Currently  Lifestyle  . Physical activity:    Days per week: Not on file  Minutes per session: Not on file  . Stress: Not on file  Relationships  . Social connections:    Talks on phone: Not on file    Gets together: Not on file    Attends religious service: Not on file    Active member of club or organization: Not on file    Attends meetings of clubs or organizations: Not on file    Relationship status: Not on file  . Intimate partner violence:    Fear of current or ex partner: Not on file    Emotionally abused: Not on file    Physically abused: Not on file    Forced sexual activity: Not on file  Other Topics Concern  . Not on file  Social History Narrative   Lives at home alone.   Right-handed.   Occasional caffeine use.   Retired [employer] since 2014. (Rehab).       Family History  Problem Relation Age of Onset  . Coronary artery disease Mother   . Coronary artery disease Father   . Depression Father      Review of Systems: General: negative for chills, fever, night sweats or weight changes.  Cardiovascular: negative for chest pain, dyspnea on exertion, edema, orthopnea, palpitations, paroxysmal nocturnal dyspnea or shortness of breath Dermatological: negative for rash Respiratory: negative for cough or wheezing Urologic: negative for hematuria Abdominal: negative for nausea, vomiting, diarrhea, bright red blood per rectum, melena, or hematemesis Neurologic: negative for visual changes, syncope, or dizziness All other systems reviewed and are otherwise negative except as noted above.    Blood pressure 118/70, pulse 78, height 5\' 2"  (1.575 m), weight 290 lb  (131.5 kg).  General appearance: alert, cooperative, no distress and morbidly obese Lungs: clear to auscultation bilaterally Heart: regular rate and rhythm Extremities: no edema Skin: warm and dry Neurologic: Grossly normal   ASSESSMENT AND PLAN:   Chronic diastolic CHF- Currently stable  Morbid obesity (HCC) BMI 53  CAD S/P percutaneous coronary angioplasty RCA PCI with stent 2008-  cath 08/09/2018- 80% ISR and diffuse moderate RCA disease and distal LAD disease. Plan is for medical Rx.  Essential hypertension Controlled  Obstructive sleep apnea Followed by Dr Annamaria Boots- she reports compliance with C-pap.  Apparently she may not qualify for daytime O2.  Paroxysmal A-fib (HCC) Holding NSR- on Coumadin. ASA resumed for CAD  PLAN  Same Rx- f/u Dr Gwenlyn Found in 3 months. We discussed weight loss- she has been placed on a diet for this.   Kerin Ransom PA-C 09/27/2018 9:08 AM

## 2018-09-27 NOTE — Patient Instructions (Signed)
Medication Instructions:  Your physician recommends that you continue on your current medications as directed. Please refer to the Current Medication list given to you today.  If you need a refill on your cardiac medications before your next appointment, please call your pharmacy.   Lab work: NONE If you have labs (blood work) drawn today and your tests are completely normal, you will receive your results only by: Marland Kitchen MyChart Message (if you have MyChart) OR . A paper copy in the mail If you have any lab test that is abnormal or we need to change your treatment, we will call you to review the results.  Testing/Procedures: NONE  Follow-Up: At [facility], you and your health needs are our priority.  As part of our continuing mission to provide you with exceptional heart care, we have created designated Provider Care Teams.  These Care Teams include your primary Cardiologist (physician) and Advanced Practice Providers (APPs -  Physician Assistants and Nurse Practitioners) who all work together to provide you with the care you need, when you need it. You will need a follow up appointment in 3 months (June 2020) with Dr. Quay Burow.

## 2018-09-27 NOTE — Progress Notes (Signed)
[facility] [phone]   [phone]  OFFICE PROGRESS NOTE  [name], [name] [place] [address]  DIAGNOSIS: Unspecified anemia questionable for anemia of chronic disease/iron deficiency.   PRIOR THERAPY: Status post Feraheme infusion 510 mg IV weekly on as-needed basis.  Last dose was given August 12, 2017.  CURRENT THERAPY: Integra +1 capsule p.o. daily.  INTERVAL HISTORY: Michele [name] 68 y.o. female returns to the clinic today for follow-up visit accompanied by family member.  The patient is feeling fine today with no concerning complaints.  She continues to tolerate her treatment with Integra plus fairly well.  She denied having any upset stomach or constipation.  She has no nausea, vomiting or diarrhea.  She denied having any chest pain, shortness of breath, cough or hemoptysis.  She denied having any headache or visual changes.  She is here today for evaluation with repeat CBC, iron study and ferritin.   MEDICAL HISTORY: Past Medical History:  Diagnosis Date  . Allergic rhinitis   . ARF (acute renal failure) ([place]) 03/2017  . 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 Asthma   . CAD (coronary artery disease) 01/30/2007   stents trivial coronary artery disease diffusely, with a recent deployment od a intracoronary artery stent, 3.5 x 12 mm driver stent with no more than 20-30% in- stents restenosis.done by Dr [name] with re-look on novenber 10 2008 revealing a widely patent stent with otherwise trival CAD and normal LV function  . Carpal tunnel syndrome, right   . Chest pain 07/17/12009   2 D Echo EF >55%  . Chronic lower back pain   . Complication of anesthesia    slow to wake up  . COPD (chronic obstructive pulmonary disease) ([place])   . DDD (degenerative disc disease), lumbar    multilevel lumbar degenerative disc disease  . Depression   . Diabetes mellitus type II    . Gait instability   . GERD (gastroesophageal reflux disease)   . History of kidney stones   . History of stomach ulcers    "years ago" (08/01/2018)  . Hyperlipidemia   . Hypertension   . IBS (irritable bowel syndrome)   . Iron deficiency anemia 07/13/2016  . Obesity   . On home oxygen therapy    "2L 24/7" (08/01/2018)  . OSA on CPAP    "w/O2" (08/01/2018)  . Paroxysmal A-fib ([place])   . PTSD (post-traumatic stress disorder)   . Stroke [facility])    "not sure when; showed up on one of my tests" (08/01/2018)  . TIA (transient ischemic attack)    "several; they say I have small ones" (08/01/2018)  . Urinary incontinence     ALLERGIES:  is allergic to meprobamate.  MEDICATIONS:  Current Outpatient Medications  Medication Sig Dispense Refill  . ADVAIR DISKUS 250-50 MCG/DOSE AEPB INHALE ONE PUFF TWICE DAILY 60 each 5  . albuterol (VENTOLIN HFA) 108 (90 Base) MCG/ACT inhaler Inhale 2 puffs into the lungs every 6 (six) hours as needed for wheezing or shortness of breath.    Marland Kitchen amLODipine (NORVASC) 5 MG tablet Take 1 tablet (5 mg total) by mouth daily. 30 tablet 6  . ARIPiprazole (ABILIFY) 5 MG tablet Take 1 tablet (5 mg total) by mouth daily. 90 tablet 0  . aspirin EC 81 MG EC tablet Take 1 tablet (81 mg total) by mouth daily.    . benztropine (COGENTIN)  0.5 MG tablet Take 1 tablet (0.5 mg total) by mouth at bedtime. 90 tablet 0  . FeFum-FePoly-FA-B Cmp-C-Biot (INTEGRA PLUS) CAPS Take 1 capsule by mouth every morning. 30 capsule 2  . gabapentin (NEURONTIN) 300 MG capsule Take 300 mg by mouth 3 (three) times daily as needed (for pain).   0  . hydrochlorothiazide (HYDRODIURIL) 25 MG tablet Take 0.5 tablets (12.5 mg total) by mouth daily for 30 days. 30 tablet 2  . ipratropium-albuterol (DUONEB) 0.5-2.5 (3) MG/3ML SOLN USE 3 ML IN NEBULIZER EVERY 6 HOURS AS NEEDED DX: J45.20 360 mL 5  . isosorbide mononitrate (IMDUR) 30 MG 24 hr tablet TAKE 1 TABLET (30 MG TOTAL) BY MOUTH DAILY. 30 tablet 0  .  MAGNESIUM GLUCONATE PO Take by mouth.    . metFORMIN (GLUCOPHAGE-XR) 500 MG 24 hr tablet TAKE ONE (1) TABLET BY MOUTH EVERY DAY WITH BREAKFAST 90 tablet 1  . metoprolol tartrate (LOPRESSOR) 25 MG tablet Take 1 tablet (25 mg total) by mouth 2 (two) times daily. 180 tablet 3  . nitroGLYCERIN (NITROSTAT) 0.4 MG SL tablet PLACE 1 TABLET UNDER THE TONGUE EVERY 5 MINUTES AS NEEDED FOR CHEST PAIN.  MAY REPEAT 3 TIMES.  IF NO RELIEF CALL DOCTOR 25 tablet 11  . OXYGEN Inhale 2 L into the lungs as needed (for shortness of breath).     . pantoprazole (PROTONIX) 40 MG tablet Take 1 tablet (40 mg total) by mouth daily. Take twice a day before meals for 8 weeks, then once a day 90 tablet 1  . rosuvastatin (CRESTOR) 10 MG tablet TAKE ONE (1) TABLET BY MOUTH EVERY DAY 90 tablet 1  . sertraline (ZOLOFT) 100 MG tablet Take 2 tablets (200 mg total) by mouth daily. 180 tablet 0  . warfarin (COUMADIN) 7.5 MG tablet TAKE ONE AND ONE-HALF TO TWO TABLETS BY MOUTH DAILY AS DIRECTED (Patient taking differently: Take 7.5-11.25 mg by mouth See admin instructions. Taking one tablet (7.5mg ) on Monday, Wed, Fri. All other days taking one & one-half tablet=11.25mg ) 150 tablet 0   No current facility-administered medications for this visit.     SURGICAL HISTORY:  Past Surgical History:  Procedure Laterality Date  . CARDIAC CATHETERIZATION    . CHOLECYSTECTOMY    . COLONOSCOPY WITH PROPOFOL N/A 11/18/2017   Procedure: COLONOSCOPY WITH PROPOFOL;  Surgeon: Carol Ada, MD;  Location: [facility];  Service: Endoscopy;  Laterality: N/A;  . CORONARY ANGIOPLASTY WITH STENT PLACEMENT    . CYST EXCISION Left    groin  . CYSTOSCOPY W/ STONE MANIPULATION    . ESOPHAGOGASTRODUODENOSCOPY N/A 09/25/2017   Procedure: ESOPHAGOGASTRODUODENOSCOPY (EGD);  Surgeon: Clarene Essex, MD;  Location: [facility];  Service: Endoscopy;  Laterality: N/A;  . EXCISIONAL HEMORRHOIDECTOMY    . LEFT HEART CATH AND CORONARY ANGIOGRAPHY N/A 08/09/2018    Procedure: LEFT HEART CATH AND CORONARY ANGIOGRAPHY;  Surgeon: Belva Crome, MD;  Location: [facility];  Service: Cardiovascular;  Laterality: N/A;  . stress myocardial perfusion study  09/08/2010   normal; EF 75%    REVIEW OF SYSTEMS:  A comprehensive review of systems was negative except for: Constitutional: positive for fatigue Respiratory: positive for dyspnea on exertion   PHYSICAL EXAMINATION: General appearance: alert, cooperative, fatigued and no distress Head: Normocephalic, without obvious abnormality, atraumatic Neck: no adenopathy, no JVD, supple, symmetrical, trachea midline and thyroid not enlarged, symmetric, no tenderness/mass/nodules Lymph nodes: Cervical, supraclavicular, and axillary nodes normal. Resp: clear to auscultation bilaterally Back: symmetric, no curvature. ROM normal.  No CVA tenderness. Cardio: regular rate and rhythm, S1, S2 normal, no murmur, click, rub or gallop GI: soft, non-tender; bowel sounds normal; no masses,  no organomegaly Extremities: extremities normal, atraumatic, no cyanosis or edema  ECOG PERFORMANCE STATUS: 1 - Symptomatic but completely ambulatory  Blood pressure (!) 141/74, pulse 84, temperature 99.1 F (37.3 C), temperature source Oral, resp. rate 20, height 5\' 2"  (1.575 m), weight 290 lb (131.5 kg), SpO2 98 %.  LABORATORY DATA: Lab Results  Component Value Date   WBC 6.1 09/27/2018   HGB 10.7 (L) 09/27/2018   HCT 36.5 09/27/2018   MCV 72.0 (L) 09/27/2018   PLT 278 09/27/2018      Chemistry      Component Value Date/Time   NA 143 08/17/2018 1214   NA 144 10/02/2012 0905   K 4.2 08/17/2018 1214   K 3.4 (L) 10/02/2012 0905   CL 99 08/17/2018 1214   CL 101 10/02/2012 0905   CO2 25 08/17/2018 1214   CO2 31 (H) 10/02/2012 0905   BUN 10 08/17/2018 1214   BUN 13.9 10/02/2012 0905   CREATININE 0.88 08/17/2018 1214   CREATININE 0.77 12/08/2014 1153   CREATININE 0.8 10/02/2012 0905      Component Value Date/Time    CALCIUM 9.6 08/17/2018 1214   CALCIUM 9.9 10/02/2012 0905   ALKPHOS 129 (H) 08/17/2018 1214   AST 26 08/17/2018 1214   ALT 51 (H) 08/17/2018 1214   BILITOT <0.2 08/17/2018 1214      RADIOGRAPHIC STUDIES: No results found.  ASSESSMENT AND PLAN:  This is a very pleasant 69 years old African-American female with iron deficiency anemia plus/minus anemia of chronic disease status post Feraheme infusion. Her last Feraheme infusion was given on August 12, 2017.   The patient is currently on treatment with Integra plus and tolerating it fairly well. Repeat CBC today showed low but stable hemoglobin and hematocrit.  Iron study and ferritin are still pending. I recommended for the patient to continue her current treatment with Integra plus with the same dose. I will see her back for follow-up visit in 3 months for evaluation with repeat CBC, iron study and ferritin. The patient was advised to call immediately if she has any concerning symptoms in the interval. The patient voices understanding of current disease status and treatment options and is in agreement with the current care plan. All questions were answered. The patient knows to call the clinic with any problems, questions or concerns. We can certainly see the patient much sooner if necessary. I spent 10 minutes counseling the patient face to face. The total time spent in the appointment was 15 minutes.  Disclaimer: This note was dictated with voice recognition software. Similar sounding words can inadvertently be transcribed and may not be corrected upon review.

## 2018-09-27 NOTE — Telephone Encounter (Signed)
Gave avs and calendar ° °

## 2018-09-28 ENCOUNTER — Ambulatory Visit (INDEPENDENT_AMBULATORY_CARE_PROVIDER_SITE_OTHER): Payer: Medicare Other | Admitting: Licensed Clinical Social Worker

## 2018-09-28 ENCOUNTER — Encounter (HOSPITAL_COMMUNITY): Payer: Self-pay | Admitting: Licensed Clinical Social Worker

## 2018-09-28 DIAGNOSIS — F330 Major depressive disorder, recurrent, mild: Secondary | ICD-10-CM | POA: Diagnosis not present

## 2018-09-28 NOTE — Progress Notes (Signed)
THERAPIST PROGRESS NOTE  Session Time: 11:00am-11:55am  Participation Level: Active  Behavioral Response: Well GroomedAlertEuthymic  Type of Therapy: Individual Therapy  Treatment Goals addressed: Improve Psychiatric Symptoms, elevate mood (increased self-esteem, increased self-compassion, increased interaction), improve unhelpful thought patterns, controlled behavior, moderate mood, deliberate speech and thought process(improved social functioning, healthy adjustment to living situation), Learn about diagnosis, healthy coping skills  Interventions: Motivational Interviewing, CBT, Grounding & Mindfulness Techniques, psychoeducation  Summary: Michele [name] is a 68 y.o. female who presents with Major Depressive Disorder, recurrent episode, mild  Suicidal/Homicidal: No - without intent/plan  Therapist Response: Enid Derry met with clinician for an individual session.  Michele [name] discussed her psychiatric symptoms, her current life events and her homework. Michele [name] shared that she has been feeling okay, trying to get over being more dependent on her daughter and her aid. Clinician validated thoughts and feelings using MI. Clinician discussed the willingness of others to help her because she is nice and kind. Michele [name] identified the use of gratitude as a daily practice, which helps her continue on in her current state. Clinician discussed the health and emotional benefits of maintaining a healthy gratitude practice.  Clinician explored health and wellness activities. Michele [name] reports she is waiting for discharge from her doctor about whether she can return to water aerobics. Michele [name] reported having a couple of falls recently.   Plan: Return again in 4 weeks  Diagnosis:     Axis I: Major Depressive Disorder, recurrent episode, mild   Mindi Curling, LCSW 09/28/2018

## 2018-10-03 ENCOUNTER — Other Ambulatory Visit: Payer: Self-pay | Admitting: Nurse Practitioner

## 2018-10-04 ENCOUNTER — Other Ambulatory Visit (HOSPITAL_COMMUNITY): Payer: Self-pay | Admitting: Cardiology

## 2018-10-04 ENCOUNTER — Other Ambulatory Visit (HOSPITAL_COMMUNITY): Payer: Self-pay | Admitting: Psychiatry

## 2018-10-04 DIAGNOSIS — F331 Major depressive disorder, recurrent, moderate: Secondary | ICD-10-CM

## 2018-10-11 ENCOUNTER — Other Ambulatory Visit (HOSPITAL_COMMUNITY): Payer: Self-pay | Admitting: Psychiatry

## 2018-10-11 DIAGNOSIS — J9621 Acute and chronic respiratory failure with hypoxia: Secondary | ICD-10-CM | POA: Diagnosis not present

## 2018-10-11 DIAGNOSIS — M25562 Pain in left knee: Secondary | ICD-10-CM | POA: Diagnosis not present

## 2018-10-11 DIAGNOSIS — Z7984 Long term (current) use of oral hypoglycemic drugs: Secondary | ICD-10-CM | POA: Diagnosis not present

## 2018-10-11 DIAGNOSIS — D509 Iron deficiency anemia, unspecified: Secondary | ICD-10-CM | POA: Diagnosis not present

## 2018-10-11 DIAGNOSIS — F319 Bipolar disorder, unspecified: Secondary | ICD-10-CM | POA: Diagnosis not present

## 2018-10-11 DIAGNOSIS — J441 Chronic obstructive pulmonary disease with (acute) exacerbation: Secondary | ICD-10-CM | POA: Diagnosis not present

## 2018-10-11 DIAGNOSIS — G4733 Obstructive sleep apnea (adult) (pediatric): Secondary | ICD-10-CM | POA: Diagnosis not present

## 2018-10-11 DIAGNOSIS — I2510 Atherosclerotic heart disease of native coronary artery without angina pectoris: Secondary | ICD-10-CM | POA: Diagnosis not present

## 2018-10-11 DIAGNOSIS — Z7982 Long term (current) use of aspirin: Secondary | ICD-10-CM | POA: Diagnosis not present

## 2018-10-11 DIAGNOSIS — Z9981 Dependence on supplemental oxygen: Secondary | ICD-10-CM | POA: Diagnosis not present

## 2018-10-11 DIAGNOSIS — E119 Type 2 diabetes mellitus without complications: Secondary | ICD-10-CM | POA: Diagnosis not present

## 2018-10-11 DIAGNOSIS — I480 Paroxysmal atrial fibrillation: Secondary | ICD-10-CM | POA: Diagnosis not present

## 2018-10-11 DIAGNOSIS — M1991 Primary osteoarthritis, unspecified site: Secondary | ICD-10-CM | POA: Diagnosis not present

## 2018-10-11 DIAGNOSIS — Z7901 Long term (current) use of anticoagulants: Secondary | ICD-10-CM | POA: Diagnosis not present

## 2018-10-11 DIAGNOSIS — Z6841 Body Mass Index (BMI) 40.0 and over, adult: Secondary | ICD-10-CM | POA: Diagnosis not present

## 2018-10-19 ENCOUNTER — Telehealth: Payer: Self-pay

## 2018-10-19 ENCOUNTER — Telehealth: Payer: Self-pay | Admitting: Pharmacist

## 2018-10-19 NOTE — Telephone Encounter (Signed)
1. Do you currently have a fever? no (yes = cancel and refer to pcp for e-visit) 2. Have you recently travelled on a cruise, internationally, or to [place], [place], [place], [place], [place], or [place], [place] [employer]) ? no (yes = cancel, stay home, monitor symptoms, and contact pcp or initiate e-visit if symptoms develop) 3. Have you been in contact with someone that is currently pending confirmation of Covid19 testing or has been confirmed to have the [place] virus?  no (yes = cancel, stay home, away from tested individual, monitor symptoms, and contact pcp or initiate e-visit if symptoms develop) 4. Are you currently experiencing fatigue or cough? no (yes = pt should be prepared to have a mask placed at the time of their visit).  Pt. Advised that we are restricting visitors at this time and anyone present in the vehicle should meet the above criteria as well. Advised that visit will be at curbside for finger stick ONLY and will receive call with instructions. Pt also advised to please bring own pen for signature of arrival document.   Caregiver will bring. She has been screened as well

## 2018-10-19 NOTE — Telephone Encounter (Signed)
lmom for prescreen/drive thru 

## 2018-10-23 ENCOUNTER — Ambulatory Visit (INDEPENDENT_AMBULATORY_CARE_PROVIDER_SITE_OTHER): Payer: Medicare Other

## 2018-10-23 ENCOUNTER — Other Ambulatory Visit: Payer: Self-pay

## 2018-10-23 DIAGNOSIS — Z7901 Long term (current) use of anticoagulants: Secondary | ICD-10-CM | POA: Diagnosis not present

## 2018-10-23 DIAGNOSIS — I480 Paroxysmal atrial fibrillation: Secondary | ICD-10-CM

## 2018-10-23 LAB — POCT INR: INR: 3.7 — AB (ref 2.0–3.0)

## 2018-10-23 NOTE — Patient Instructions (Signed)
Description   Called spoke with pt's nurse, advised to skip today's dosage of Coumadin, then resume same dosage 1.5 tablet daily except 1 tablets each Monday, Wednesday and Friday  Repeat INR in 3 weeks. Call us with any medication changes or concerns [phone].

## 2018-11-01 ENCOUNTER — Ambulatory Visit (HOSPITAL_COMMUNITY): Payer: Medicare Other | Admitting: Licensed Clinical Social Worker

## 2018-11-02 ENCOUNTER — Telehealth: Payer: Self-pay

## 2018-11-02 NOTE — Telephone Encounter (Signed)
Michele [name] from [facility] called requesting verbal orders to extend patient's PT due to her having a fall earlier this week when she wass trying to sit down. Patient has some bruising in her right upper arm but no significant injury to it. [phone]  RETURNED [place] FNP-BC. YRL,RMA

## 2018-11-09 ENCOUNTER — Ambulatory Visit (INDEPENDENT_AMBULATORY_CARE_PROVIDER_SITE_OTHER): Payer: Medicare Other | Admitting: Licensed Clinical Social Worker

## 2018-11-09 ENCOUNTER — Other Ambulatory Visit: Payer: Self-pay

## 2018-11-09 ENCOUNTER — Encounter (HOSPITAL_COMMUNITY): Payer: Self-pay | Admitting: Licensed Clinical Social Worker

## 2018-11-09 DIAGNOSIS — F330 Major depressive disorder, recurrent, mild: Secondary | ICD-10-CM

## 2018-11-09 NOTE — Progress Notes (Signed)
Virtual Visit via Telephone Note  I connected with Michele [name] on 11/09/18 at  3:30 PM EDT by telephone and verified that I am speaking with the correct person using two identifiers.   I discussed the limitations, risks, security and privacy concerns of performing an evaluation and management service by telephone and the availability of in person appointments. I also discussed with the patient that there may be a patient responsible charge related to this service. The patient expressed understanding and agreed to proceed.   Type of Therapy: Individual Therapy   Treatment Goals addressed: Improve Psychiatric Symptoms, elevate mood (increased self-esteem, increased self-compassion, increased interaction), improve unhelpful thought patterns, controlled behavior, moderate mood, deliberate speech and thought process(improved social functioning, healthy adjustment to living situation), Learn about diagnosis, healthy coping skills   Interventions: Motivational Interviewing, CBT, Grounding & Mindfulness Techniques, psychoeducation   Summary: Michele [name] is a 68 y.o. female who presents with Major Depressive Disorder, recurrent episode, mild   Suicidal/Homicidal: No - without intent/plan   Therapist Response:  Michele [name] met with clinician for an individual session. Michele [name] discussed her psychiatric symptoms, her current life events and her homework. Michele [name] shared that she has been doing okay being at home, staying in for the most part, but getting out to pick up groceries or go by the pharmacy with her daughter. She reports these little trips are something to get excited about due to having to stay in most of the time. Clinician explored mood and interactions. Michele [name] reports she is feeling pretty good and has not had any significant depression or anxiety. She reports her sisters come to visit her a few times per week and she will sit on the back deck and watch her grandchildren play.  Michele [name] reported that she fell a couple of weeks ago, but sustained only minor bruising. She is participating in physical therapy again, which has been positive.   Plan: Return again in 2 weeks   Diagnosis: Axis I: Major Depressive Disorder, recurrent episode, mild     I discussed the assessment and treatment plan with the patient. The patient was provided an opportunity to ask questions and all were answered. The patient agreed with the plan and demonstrated an understanding of the instructions.   The patient was advised to call back or seek an in-person evaluation if the symptoms worsen or if the condition fails to improve as anticipated.  I provided 20 minutes of non-face-to-face time during this encounter.   Mindi Curling, LCSW

## 2018-11-10 ENCOUNTER — Telehealth: Payer: Self-pay

## 2018-11-10 NOTE — Telephone Encounter (Signed)

## 2018-11-13 ENCOUNTER — Other Ambulatory Visit: Payer: Self-pay

## 2018-11-13 ENCOUNTER — Other Ambulatory Visit (HOSPITAL_COMMUNITY): Payer: Self-pay | Admitting: Psychiatry

## 2018-11-13 ENCOUNTER — Other Ambulatory Visit: Payer: Self-pay | Admitting: Nurse Practitioner

## 2018-11-13 ENCOUNTER — Ambulatory Visit (INDEPENDENT_AMBULATORY_CARE_PROVIDER_SITE_OTHER): Payer: Medicare Other | Admitting: *Deleted

## 2018-11-13 ENCOUNTER — Other Ambulatory Visit: Payer: Self-pay | Admitting: Cardiovascular Disease

## 2018-11-13 ENCOUNTER — Other Ambulatory Visit: Payer: Self-pay | Admitting: Internal Medicine

## 2018-11-13 ENCOUNTER — Other Ambulatory Visit: Payer: Self-pay | Admitting: Cardiology

## 2018-11-13 DIAGNOSIS — I2510 Atherosclerotic heart disease of native coronary artery without angina pectoris: Secondary | ICD-10-CM | POA: Diagnosis not present

## 2018-11-13 DIAGNOSIS — M25562 Pain in left knee: Secondary | ICD-10-CM | POA: Diagnosis not present

## 2018-11-13 DIAGNOSIS — F319 Bipolar disorder, unspecified: Secondary | ICD-10-CM | POA: Diagnosis not present

## 2018-11-13 DIAGNOSIS — Z7901 Long term (current) use of anticoagulants: Secondary | ICD-10-CM

## 2018-11-13 DIAGNOSIS — G4733 Obstructive sleep apnea (adult) (pediatric): Secondary | ICD-10-CM | POA: Diagnosis not present

## 2018-11-13 DIAGNOSIS — K219 Gastro-esophageal reflux disease without esophagitis: Secondary | ICD-10-CM

## 2018-11-13 DIAGNOSIS — J441 Chronic obstructive pulmonary disease with (acute) exacerbation: Secondary | ICD-10-CM | POA: Diagnosis not present

## 2018-11-13 DIAGNOSIS — M1991 Primary osteoarthritis, unspecified site: Secondary | ICD-10-CM | POA: Diagnosis not present

## 2018-11-13 DIAGNOSIS — F331 Major depressive disorder, recurrent, moderate: Secondary | ICD-10-CM

## 2018-11-13 DIAGNOSIS — I480 Paroxysmal atrial fibrillation: Secondary | ICD-10-CM | POA: Diagnosis not present

## 2018-11-13 DIAGNOSIS — J9621 Acute and chronic respiratory failure with hypoxia: Secondary | ICD-10-CM | POA: Diagnosis not present

## 2018-11-13 DIAGNOSIS — D509 Iron deficiency anemia, unspecified: Secondary | ICD-10-CM | POA: Diagnosis not present

## 2018-11-13 DIAGNOSIS — E119 Type 2 diabetes mellitus without complications: Secondary | ICD-10-CM | POA: Diagnosis not present

## 2018-11-13 LAB — POCT INR: INR: 1.4 — AB (ref 2.0–3.0)

## 2018-11-13 MED FILL — HYDROCHLOROTHIAZIDE 25 MG T: 25 | 60 days supply | Qty: 30 | Fill #0

## 2018-11-13 MED FILL — AMLODIPINE BESYLATE 5 MG TA: 5 | 30 days supply | Qty: 30 | Fill #0

## 2018-11-13 MED FILL — WARFARIN SODIUM 7.5 MG TAB: 7.5 | 75 days supply | Qty: 150 | Fill #0

## 2018-11-13 MED FILL — GABAPENTIN 300 MG CAPSULE: 300 | 30 days supply | Qty: 90 | Fill #0

## 2018-11-13 NOTE — Patient Instructions (Signed)
Description   Call Jeani Hawking at [phone] Called spoke with pt's caregiver, advised to have pt take 1.5 tablets today and 2 tablets tomorrow of Coumadin, then resume same dosage 1.5 tablet daily except 1 tablets each Monday, Wednesday and Friday  Repeat INR in 1 week. Call us with any medication changes or concerns [phone].

## 2018-11-16 ENCOUNTER — Encounter: Payer: Self-pay | Admitting: Nurse Practitioner

## 2018-11-16 ENCOUNTER — Ambulatory Visit (INDEPENDENT_AMBULATORY_CARE_PROVIDER_SITE_OTHER): Payer: Medicare Other | Admitting: Nurse Practitioner

## 2018-11-16 ENCOUNTER — Other Ambulatory Visit: Payer: Self-pay

## 2018-11-16 VITALS — BP 122/76 | HR 81 | Temp 99.0°F | Ht 61.4 in | Wt 289.8 lb

## 2018-11-16 DIAGNOSIS — I10 Essential (primary) hypertension: Secondary | ICD-10-CM | POA: Diagnosis not present

## 2018-11-16 DIAGNOSIS — I480 Paroxysmal atrial fibrillation: Secondary | ICD-10-CM

## 2018-11-16 DIAGNOSIS — E782 Mixed hyperlipidemia: Secondary | ICD-10-CM

## 2018-11-16 DIAGNOSIS — J441 Chronic obstructive pulmonary disease with (acute) exacerbation: Secondary | ICD-10-CM

## 2018-11-16 MED ORDER — PREDNISONE 10 MG (21) PO TBPK: ORAL_TABLET | ORAL | 0 refills | Status: DC

## 2018-11-16 NOTE — Progress Notes (Signed)
Subjective:     Patient ID: Unk Michele [name] , female    DOB: Dec 23, 1950 , 68 y.o.   MRN: [id number]   Chief Complaint  Patient presents with  . Hypertension  . Hyperlipidemia  . Diabetes    HPI  Hypertension  This is a chronic problem. The current episode started more than 1 year ago. The problem has been gradually worsening since onset. The problem is controlled. Associated symptoms include shortness of breath. Pertinent negatives include no anxiety, chest pain, headaches, palpitations or peripheral edema. There are no associated agents to hypertension. Risk factors for coronary artery disease include obesity.  Hyperlipidemia  Associated symptoms include shortness of breath. Pertinent negatives include no chest pain.  Diabetes  Pertinent negatives for hypoglycemia include no dizziness or headaches. Pertinent negatives for diabetes include no chest pain, no fatigue, no polydipsia, no polyphagia and no polyuria.     Past Medical History:  Diagnosis Date  . Allergic rhinitis   . ARF (acute renal failure) ([place]) 03/2017  . 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 Asthma   . CAD (coronary artery disease) 01/30/2007   stents trivial coronary artery disease diffusely, with a recent deployment od a intracoronary artery stent, 3.5 x 12 mm driver stent with no more than 20-30% in- stents restenosis.done by Dr [name] with re-look on novenber 10 2008 revealing a widely patent stent with otherwise trival CAD and normal LV function  . Carpal tunnel syndrome, right   . Chest pain 07/17/12009   2 D Echo EF >55%  . Chronic lower back pain   . Complication of anesthesia    slow to wake up  . COPD (chronic obstructive pulmonary disease) ([place])   . DDD (degenerative disc disease), lumbar    multilevel lumbar degenerative disc disease  . Depression   . Diabetes mellitus type II   . Gait instability   . GERD (gastroesophageal reflux disease)   . History of kidney  stones   . History of stomach ulcers    "years ago" (08/01/2018)  . Hyperlipidemia   . Hypertension   . IBS (irritable bowel syndrome)   . Iron deficiency anemia 07/13/2016  . Obesity   . On home oxygen therapy    "2L 24/7" (08/01/2018)  . OSA on CPAP    "w/O2" (08/01/2018)  . Paroxysmal A-fib ([place])   . PTSD (post-traumatic stress disorder)   . Stroke [facility])    "not sure when; showed up on one of my tests" (08/01/2018)  . TIA (transient ischemic attack)    "several; they say I have small ones" (08/01/2018)  . Urinary incontinence      Family History  Problem Relation Age of Onset  . Coronary artery disease Mother   . Coronary artery disease Father   . Depression Father      Current Outpatient Medications:  .  ACCU-CHEK GUIDE test strip, insert 1 by Subcutaneous route every day check blood sugar before breakfast and dinner, Disp: 300 each, Rfl: 2 .  ADVAIR DISKUS 250-50 MCG/DOSE AEPB, INHALE ONE PUFF TWICE DAILY, Disp: 60 each, Rfl: 5 .  albuterol (VENTOLIN HFA) 108 (90 Base) MCG/ACT inhaler, Inhale 2 puffs into the lungs every 6 (six) hours as needed for wheezing or shortness of breath., Disp: , Rfl:  .  amLODipine (NORVASC) 5 MG tablet, Take 1 tablet (5 mg total) by mouth daily., Disp: 30 tablet, Rfl: 6 .  ARIPiprazole (ABILIFY) 5 MG tablet, Take 1 tablet (  5 mg total) by mouth daily., Disp: 90 tablet, Rfl: 0 .  aspirin EC 81 MG EC tablet, Take 1 tablet (81 mg total) by mouth daily., Disp: , Rfl:  .  benztropine (COGENTIN) 0.5 MG tablet, Take 1 tablet (0.5 mg total) by mouth at bedtime., Disp: 90 tablet, Rfl: 0 .  FeFum-FePoly-FA-B Cmp-C-Biot (INTEGRA PLUS) CAPS, Take 1 capsule by mouth once daily in the morning, Disp: 30 capsule, Rfl: 0 .  gabapentin (NEURONTIN) 300 MG capsule, Take 300 mg by mouth 3 (three) times daily as needed (for pain). , Disp: , Rfl: 0 .  hydrochlorothiazide (HYDRODIURIL) 25 MG tablet, Take 0.5 tablets (12.5 mg total) by mouth daily for 30 days., Disp: 30  tablet, Rfl: 2 .  ipratropium-albuterol (DUONEB) 0.5-2.5 (3) MG/3ML SOLN, USE 3 ML IN NEBULIZER EVERY 6 HOURS AS NEEDED DX: J45.20, Disp: 360 mL, Rfl: 5 .  isosorbide mononitrate (IMDUR) 30 MG 24 hr tablet, TAKE 1 TABLET (30 MG TOTAL) BY MOUTH DAILY., Disp: 90 tablet, Rfl: 0 .  MAGNESIUM GLUCONATE PO, Take by mouth., Disp: , Rfl:  .  metFORMIN (GLUCOPHAGE-XR) 500 MG 24 hr tablet, TAKE ONE (1) TABLET BY MOUTH EVERY DAY WITH BREAKFAST, Disp: 90 tablet, Rfl: 1 .  metoprolol tartrate (LOPRESSOR) 25 MG tablet, Take 1 tablet (25 mg total) by mouth 2 (two) times daily., Disp: 180 tablet, Rfl: 3 .  nitroGLYCERIN (NITROSTAT) 0.4 MG SL tablet, PLACE 1 TABLET UNDER THE TONGUE EVERY 5 MINUTES AS NEEDED FOR CHEST PAIN.  MAY REPEAT 3 TIMES.  IF NO RELIEF CALL DOCTOR, Disp: 25 tablet, Rfl: 11 .  OXYGEN, Inhale 2 L into the lungs as needed (for shortness of breath). , Disp: , Rfl:  .  pantoprazole (PROTONIX) 40 MG tablet, TAKE 1 TABLET (40 MG TOTAL) BY MOUTH DAILY. TAKE TWICE A DAY BEFORE MEALS FOR 8 WEEKS, THEN ONCE A DAY, Disp: 90 tablet, Rfl: 1 .  rosuvastatin (CRESTOR) 10 MG tablet, TAKE ONE (1) TABLET BY MOUTH EVERY DAY, Disp: 90 tablet, Rfl: 1 .  sertraline (ZOLOFT) 100 MG tablet, Take 2 tablets (200 mg total) by mouth daily., Disp: 180 tablet, Rfl: 0 .  warfarin (COUMADIN) 7.5 MG tablet, TAKE ONE AND ONE-HALF TO TWO TABLETS BY MOUTH DAILY AS DIRECTED, Disp: 150 tablet, Rfl: 0   Allergies  Allergen Reactions  . Meprobamate Nausea And Vomiting     Review of Systems  Constitutional: Negative.  Negative for diaphoresis and fatigue.  Respiratory: Positive for shortness of breath. Negative for cough and wheezing.   Cardiovascular: Negative for chest pain, palpitations and leg swelling.  Endocrine: Negative for polydipsia, polyphagia and polyuria.  Neurological: Negative for dizziness and headaches.     Today's Vitals   11/16/18 1024  BP: 122/76  Pulse: 81  Temp: 99 F (37.2 C)  TempSrc: Oral   Weight: 289 lb 12.8 oz (131.5 kg)  Height: 5' 1.4" (1.56 m)  PainSc: 0-No pain   Body mass index is 54.05 kg/m.   Objective:  Physical Exam Constitutional:      Appearance: Normal appearance.  Cardiovascular:     Rate and Rhythm: Normal rate and regular rhythm.     Pulses: Normal pulses.     Heart sounds: Normal heart sounds. No murmur.  Pulmonary:     Effort: Pulmonary effort is normal. No respiratory distress.     Breath sounds: No decreased air movement. Examination of the right-upper field reveals wheezing. Examination of the left-upper field reveals wheezing. Examination of the right-lower  field reveals wheezing. Examination of the left-lower field reveals wheezing. Wheezing present.  Skin:    General: Skin is warm and dry.     Capillary Refill: Capillary refill takes less than 2 seconds.  Neurological:     General: No focal deficit present.     Mental Status: She is alert and oriented to person, place, and time.  Psychiatric:        Mood and Affect: Mood normal.        Behavior: Behavior normal.        Thought Content: Thought content normal.        Judgment: Judgment normal.         Assessment And Plan:     1. Essential hypertension . B/P is fairly controlled.  . CMP ordered to check renal function.  . The importance of regular exercise and dietary modification was stressed to the patient.  . Stressed importance of losing ten percent of her body weight to help with B/P control.  . The weight loss would help with decreasing cardiac and cancer risk as well.  - CMP14 + Anion Gap  2. Mixed hyperlipidemia  Chronic, controlled  Continue with current medications  3. Morbid obesity (HCC)  Chronic  Discussed healthy diet and regular exercise options   Encouraged to exercise at least 150 minutes per week with 2 days of strength training  I discussed with her trying to do chair exercises due to her limited mobility - Hemoglobin A1c - Amb ref to Medical  Nutrition Therapy-MNT  4. AF (paroxysmal atrial fibrillation) (HCC)  Chronic, controlled  Continue with current medications  Continue your follow up with cardiology  5. COPD exacerbation (HCC)  Chronic, controlled  Continue with current medications  Continue with follow up with pulmonology  She does have mild wheezing and will treat to avoid her worsening - predniSONE (STERAPRED UNI-PAK 21 TAB) 10 MG (21) TBPK tablet; Take as directed  Dispense: 21 tablet; Refill: 0   Minette Brine, FNP    THE PATIENT IS ENCOURAGED TO PRACTICE SOCIAL DISTANCING DUE TO THE COVID-19 PANDEMIC.

## 2018-11-16 NOTE — Patient Instructions (Signed)
Exercises To Do While Sitting  Exercises that you do while sitting (chair exercises) can give you many of the same benefits as full exercise. Benefits include strengthening your heart, burning calories, and keeping muscles and joints healthy. Exercise can also improve your mood and help with depression and anxiety. You may benefit from chair exercises if you are unable to do standing exercises because of:  Diabetic foot pain.  Obesity.  Illness.  Arthritis.  Recovery from surgery or injury.  Breathing problems.  Balance problems.  Another type of disability. Before starting chair exercises, check with your health care provider or a physical therapist to find out how much exercise you can tolerate and which exercises are safe for you. If your health care provider approves:  Start out slowly and build up over time. Aim to work up to about 10-20 minutes for each exercise session.  Make exercise part of your daily routine.  Drink water when you exercise. Do not wait until you are thirsty. Drink every 10-15 minutes.  Stop exercising right away if you have pain, nausea, shortness of breath, or dizziness.  If you are exercising in a wheelchair, make sure to lock the wheels.  Ask your health care provider whether you can do tai chi or yoga. Many positions in these mind-body exercises can be modified to do while seated. Warm-up Before starting other exercises: 1. Sit up as straight as you can. Have your knees bent at 90 degrees, which is the shape of the capital letter "L." Keep your feet flat on the floor. 2. Sit at the front edge of your chair, if you can. 3. Pull in (tighten) the muscles in your abdomen and stretch your spine and neck as straight as you can. Hold this position for a few minutes. 4. Breathe in and out evenly. Try to concentrate on your breathing, and relax your mind. Stretching Exercise A: Arm stretch 1. Hold your arms out straight in front of your body. 2. Bend  your hands at the wrist with your fingers pointing up, as if signaling someone to stop. Notice the slight tension in your forearms as you hold the position. 3. Keeping your arms out and your hands bent, rotate your hands outward as far as you can and hold this stretch. Aim to have your thumbs pointing up and your pinkie fingers pointing down. Slowly repeat arm stretches for one minute as tolerated. Exercise B: Leg stretch 1. If you can move your legs, try to "draw" letters on the floor with the toes of your foot. Write your name with one foot. 2. Write your name with the toes of your other foot. Slowly repeat the movements for one minute as tolerated. Exercise C: Reach for the sky 1. Reach your hands as far over your head as you can to stretch your spine. 2. Move your hands and arms as if you are climbing a rope. Slowly repeat the movements for one minute as tolerated. Range of motion exercises Exercise A: Shoulder roll 1. Let your arms hang loosely at your sides. 2. Lift just your shoulders up toward your ears, then let them relax back down. 3. When your shoulders feel loose, rotate your shoulders in backward and forward circles. Do shoulder rolls slowly for one minute as tolerated. Exercise B: March in place 1. As if you are marching, pump your arms and lift your legs up and down. Lift your knees as high as you can. ? If you are unable to lift your knees,  just pump your arms and move your ankles and feet up and down. March in place for one minute as tolerated. Exercise C: Seated jumping jacks 1. Let your arms hang down straight. 2. Keeping your arms straight, lift them up over your head. Aim to point your fingers to the ceiling. 3. While you lift your arms, straighten your legs and slide your heels along the floor to your sides, as wide as you can. 4. As you bring your arms back down to your sides, slide your legs back together. ? If you are unable to use your legs, just move your arms.  Slowly repeat seated jumping jacks for one minute as tolerated. Strengthening exercises Exercise A: Shoulder squeeze 1. Hold your arms straight out from your body to your sides, with your elbows bent and your fists pointed at the ceiling. 2. Keeping your arms in the bent position, move them forward so your elbows and forearms meet in front of your face. 3. Open your arms back out as wide as you can with your elbows still bent, until you feel your shoulder blades squeezing together. Hold for 5 seconds. Slowly repeat the movements forward and backward for one minute as tolerated. Contact a health care provider if you:  Had to stop exercising due to any of the following: ? Pain. ? Nausea. ? Shortness of breath. ? Dizziness. ? Fatigue.  Have significant pain or soreness after exercising. Get help right away if you have:  Chest pain.  Difficulty breathing. These symptoms may represent a serious problem that is an emergency. Do not wait to see if the symptoms will go away. Get medical help right away. Call your local emergency services (911 in the [place] Do not drive yourself to the hospital. This information is not intended to replace advice given to you by your health care provider. Make sure you discuss any questions you have with your health care provider. Document Released: 05/18/2017 Document Revised: 05/18/2017 Document Reviewed: 05/18/2017 Elsevier Interactive Patient Education  2019 [place] Following a healthy eating pattern may help you to achieve and maintain a healthy body weight, reduce the risk of chronic disease, and live a long and productive life. It is important to follow a healthy eating pattern at an appropriate calorie level for your body. Your nutritional needs should be met primarily through food by choosing a variety of nutrient-rich foods. What are tips for following this plan? Reading food labels  Read labels and choose the following: ?  Reduced or low sodium. ? Juices with 100% fruit juice. ? Foods with low saturated fats and high polyunsaturated and monounsaturated fats. ? Foods with whole grains, such as whole wheat, cracked wheat, brown rice, and wild rice. ? Whole grains that are fortified with folic acid. This is recommended for women who are pregnant or who want to become pregnant.  Read labels and avoid the following: ? Foods with a lot of added sugars. These include foods that contain brown sugar, corn sweetener, corn syrup, dextrose, fructose, glucose, high-fructose corn syrup, honey, invert sugar, lactose, malt syrup, maltose, molasses, raw sugar, sucrose, trehalose, or turbinado sugar.  Do not eat more than the following amounts of added sugar per day:  6 teaspoons (25 g) for women.  9 teaspoons (38 g) for men. ? Foods that contain processed or refined starches and grains. ? Refined grain products, such as white flour, degermed cornmeal, white bread, and white rice. Shopping  Choose nutrient-rich snacks, such as  vegetables, whole fruits, and nuts. Avoid high-calorie and high-sugar snacks, such as potato chips, fruit snacks, and candy.  Use oil-based dressings and spreads on foods instead of solid fats such as butter, stick margarine, or cream cheese.  Limit pre-made sauces, mixes, and "instant" products such as flavored rice, instant noodles, and ready-made pasta.  Try more plant-protein sources, such as tofu, tempeh, black beans, edamame, lentils, nuts, and seeds.  Explore eating plans such as the Mediterranean diet or vegetarian diet. Cooking  Use oil to saut or stir-fry foods instead of solid fats such as butter, stick margarine, or lard.  Try baking, boiling, grilling, or broiling instead of frying.  Remove the fatty part of meats before cooking.  Steam vegetables in water or broth. Meal planning   At meals, imagine dividing your plate into fourths: ? One-half of your plate is fruits and  vegetables. ? One-fourth of your plate is whole grains. ? One-fourth of your plate is protein, especially lean meats, poultry, eggs, tofu, beans, or nuts.  Include low-fat dairy as part of your daily diet. Lifestyle  Choose healthy options in all settings, including home, work, school, restaurants, or stores.  Prepare your food safely: ? Wash your hands after handling raw meats. ? Keep food preparation surfaces clean by regularly washing with hot, soapy water. ? Keep raw meats separate from ready-to-eat foods, such as fruits and vegetables. ? Cook seafood, meat, poultry, and eggs to the recommended internal temperature. ? Store foods at safe temperatures. In general:  Keep cold foods at 52F (4.4C) or below.  Keep hot foods at 152F (60C) or above.  Keep your freezer at [facility] (-17.8C) or below.  Foods are no longer safe to eat when they have been between the temperatures of 40-152F (4.4-60C) for more than 2 hours. What foods should I eat? Fruits Aim to eat 2 cup-equivalents of fresh, canned (in natural juice), or frozen fruits each day. Examples of 1 cup-equivalent of fruit include 1 small apple, 8 large strawberries, 1 cup canned fruit,  cup dried fruit, or 1 cup 100% juice. Vegetables Aim to eat 2-3 cup-equivalents of fresh and frozen vegetables each day, including different varieties and colors. Examples of 1 cup-equivalent of vegetables include 2 medium carrots, 2 cups raw, leafy greens, 1 cup chopped vegetable (raw or cooked), or 1 medium baked potato. Grains Aim to eat 6 ounce-equivalents of whole grains each day. Examples of 1 ounce-equivalent of grains include 1 slice of bread, 1 cup ready-to-eat cereal, 3 cups popcorn, or  cup cooked rice, pasta, or cereal. Meats and other proteins Aim to eat 5-6 ounce-equivalents of protein each day. Examples of 1 ounce-equivalent of protein include 1 egg, 1/2 cup nuts or seeds, or 1 tablespoon (16 g) peanut butter. A cut of meat or  fish that is the size of a deck of cards is about 3-4 ounce-equivalents.  Of the protein you eat each week, try to have at least 8 ounces come from seafood. This includes salmon, trout, herring, and anchovies. Dairy Aim to eat 3 cup-equivalents of fat-free or low-fat dairy each day. Examples of 1 cup-equivalent of dairy include 1 cup (240 mL) milk, 8 ounces (250 g) yogurt, 1 ounces (44 g) natural cheese, or 1 cup (240 mL) fortified soy milk. Fats and oils  Aim for about 5 teaspoons (21 g) per day. Choose monounsaturated fats, such as canola and olive oils, avocados, peanut butter, and most nuts, or polyunsaturated fats, such as sunflower, corn, and soybean oils,  walnuts, pine nuts, sesame seeds, sunflower seeds, and flaxseed. Beverages  Aim for six 8-oz glasses of water per day. Limit coffee to three to five 8-oz cups per day.  Limit caffeinated beverages that have added calories, such as soda and energy drinks.  Limit alcohol intake to no more than 1 drink a day for nonpregnant women and 2 drinks a day for men. One drink equals 12 oz of beer (355 mL), 5 oz of wine (148 mL), or 1 oz of hard liquor (44 mL). Seasoning and other foods  Avoid adding excess amounts of salt to your foods. Try flavoring foods with herbs and spices instead of salt.  Avoid adding sugar to foods.  Try using oil-based dressings, sauces, and spreads instead of solid fats. This information is based on general [place] nutrition guidelines. For more information, visit [employer]es. Exact amounts may vary based on your nutrition needs. Summary  A healthy eating plan may help you to maintain a healthy weight, reduce the risk of chronic diseases, and stay active throughout your life.  Plan your meals. Make sure you eat the right portions of a variety of nutrient-rich foods.  Try baking, boiling, grilling, or broiling instead of frying.  Choose healthy options in all settings, including home, work, school,  restaurants, or stores. This information is not intended to replace advice given to you by your health care provider. Make sure you discuss any questions you have with your health care provider. Document Released: 10/17/2017 Document Revised: 10/17/2017 Document Reviewed: 10/17/2017 Elsevier Interactive Patient Education  2019 [employer].

## 2018-11-17 ENCOUNTER — Telehealth: Payer: Self-pay

## 2018-11-17 LAB — CMP14 + ANION GAP
ALT: 24 IU/L (ref 0–32)
AST: 27 IU/L (ref 0–40)
Albumin/Globulin Ratio: 1.5 (ref 1.2–2.2)
Albumin: 4.6 g/dL (ref 3.8–4.8)
Alkaline Phosphatase: 98 IU/L (ref 39–117)
Anion Gap: 16 mmol/L (ref 10.0–18.0)
BUN/Creatinine Ratio: 23 (ref 12–28)
BUN: 18 mg/dL (ref 8–27)
Bilirubin Total: 0.2 mg/dL (ref 0.0–1.2)
CO2: 29 mmol/L (ref 20–29)
Calcium: 9.9 mg/dL (ref 8.7–10.3)
Chloride: 99 mmol/L (ref 96–106)
Creatinine, Ser: 0.79 mg/dL (ref 0.57–1.00)
GFR calc Af Amer: 90 mL/min/{1.73_m2} (ref >59)
GFR calc non Af Amer: 78 mL/min/{1.73_m2} (ref >59)
Globulin, Total: 3 g/dL (ref 1.5–4.5)
Glucose: 88 mg/dL (ref 65–99)
Potassium: 4.1 mmol/L (ref 3.5–5.2)
Sodium: 144 mmol/L (ref 134–144)
Total Protein: 7.6 g/dL (ref 6.0–8.5)

## 2018-11-17 LAB — HEMOGLOBIN A1C
Est. average glucose Bld gHb Est-mCnc: 123 mg/dL
Hgb A1c MFr Bld: 5.9 % — ABNORMAL HIGH (ref 4.8–5.6)

## 2018-11-17 NOTE — Telephone Encounter (Signed)

## 2018-11-20 ENCOUNTER — Other Ambulatory Visit: Payer: Self-pay

## 2018-11-20 ENCOUNTER — Ambulatory Visit (INDEPENDENT_AMBULATORY_CARE_PROVIDER_SITE_OTHER): Payer: Medicare Other | Admitting: *Deleted

## 2018-11-20 DIAGNOSIS — I480 Paroxysmal atrial fibrillation: Secondary | ICD-10-CM

## 2018-11-20 DIAGNOSIS — Z7901 Long term (current) use of anticoagulants: Secondary | ICD-10-CM | POA: Diagnosis not present

## 2018-11-20 LAB — POCT INR: INR: 2.3 (ref 2.0–3.0)

## 2018-11-22 ENCOUNTER — Other Ambulatory Visit: Payer: Self-pay

## 2018-11-22 ENCOUNTER — Encounter (HOSPITAL_COMMUNITY): Payer: Self-pay | Admitting: Licensed Clinical Social Worker

## 2018-11-22 ENCOUNTER — Ambulatory Visit (INDEPENDENT_AMBULATORY_CARE_PROVIDER_SITE_OTHER): Payer: Medicare Other | Admitting: Licensed Clinical Social Worker

## 2018-11-22 DIAGNOSIS — F330 Major depressive disorder, recurrent, mild: Secondary | ICD-10-CM

## 2018-11-22 NOTE — Progress Notes (Signed)
Virtual Visit via Telephone Note  I connected with Michele [name] on 11/22/18 at 10:00 AM EDT by telephone and verified that I am speaking with the correct person using two identifiers.   I discussed the limitations, risks, security and privacy concerns of performing an evaluation and management service by telephone and the availability of in person appointments. I also discussed with the patient that there may be a patient responsible charge related to this service. The patient expressed understanding and agreed to proceed.    Type of Therapy: Individual Therapy   Treatment Goals addressed: Improve Psychiatric Symptoms, elevate mood (increased self-esteem, increased self-compassion, increased interaction), improve unhelpful thought patterns, controlled behavior, moderate mood, deliberate speech and thought process(improved social functioning, healthy adjustment to living situation), Learn about diagnosis, healthy coping skills   Interventions: Motivational Interviewing, CBT, Grounding & Mindfulness Techniques, psychoeducation   Summary: Michele [name] is a 68 y.o. female who presents with Major Depressive Disorder, recurrent episode, mild   Suicidal/Homicidal: No - without intent/plan   Therapist Response:  Enid Derry met with clinician for an individual session via phone call. Sherise does not have access to video conferencing. Kaitlyne discussed her psychiatric symptoms, her current life events and her homework. Moraima shared that she is doing alright, but concerned about the Coronavirus. She questioned how long this would last and if clinician thought it would ever go away. Clinician offered some supportive words and validated feelings of hopelessness and worry. However, clinician also encouraged Hansika to utilize thought challenging and to take control of what she can. So, think about this not as "I have to stay home, but I choose to stay home for my safety and my family's  safety". Alizey reports that she feels frustrated that she has not yet received her stimulus check. Clinician identified that others have also not received this check and it is in process. Dlynn processed the loss of a family member in [place] last week. Clinician offered supportive counseling related to grief.    Plan: Return again in 2-3 weeks   Diagnosis: Axis I: Major Depressive Disorder, recurrent episode, mild   I discussed the assessment and treatment plan with the patient. The patient was provided an opportunity to ask questions and all were answered. The patient agreed with the plan and demonstrated an understanding of the instructions.   The patient was advised to call back or seek an in-person evaluation if the symptoms worsen or if the condition fails to improve as anticipated.  I provided 45 minutes of non-face-to-face time during this encounter.   Mindi Curling, LCSW

## 2018-11-24 MED FILL — ALBUTEROL SULFATE HFA 108 (: 108 (90 BAS | 25 days supply | Qty: 18 | Fill #0

## 2018-12-01 ENCOUNTER — Telehealth: Payer: Self-pay

## 2018-12-01 NOTE — Telephone Encounter (Signed)

## 2018-12-02 ENCOUNTER — Other Ambulatory Visit (HOSPITAL_COMMUNITY): Payer: Self-pay | Admitting: Psychiatry

## 2018-12-02 DIAGNOSIS — F331 Major depressive disorder, recurrent, moderate: Secondary | ICD-10-CM

## 2018-12-04 ENCOUNTER — Other Ambulatory Visit: Payer: Self-pay

## 2018-12-04 ENCOUNTER — Ambulatory Visit (INDEPENDENT_AMBULATORY_CARE_PROVIDER_SITE_OTHER): Payer: Medicare Other | Admitting: Pharmacist Clinician (PhC)/ Clinical Pharmacy Specialist

## 2018-12-04 DIAGNOSIS — I480 Paroxysmal atrial fibrillation: Secondary | ICD-10-CM | POA: Diagnosis not present

## 2018-12-04 DIAGNOSIS — Z7901 Long term (current) use of anticoagulants: Secondary | ICD-10-CM | POA: Diagnosis not present

## 2018-12-04 LAB — POCT INR: INR: 1.9 — AB (ref 2.0–3.0)

## 2018-12-04 MED ORDER — APIXABAN 5 MG PO TABS: 5.0000 mg | ORAL_TABLET | Freq: Two times a day (BID) | ORAL | 5 refills | Status: AC

## 2018-12-06 ENCOUNTER — Encounter: Payer: Self-pay | Admitting: Nurse Practitioner

## 2018-12-06 MED FILL — ELIQUIS 5 MG TABLET: 5 | 30 days supply | Qty: 60 | Fill #0

## 2018-12-08 ENCOUNTER — Other Ambulatory Visit: Payer: Self-pay | Admitting: Internal Medicine

## 2018-12-08 ENCOUNTER — Ambulatory Visit: Payer: Self-pay | Admitting: Pharmacist Clinician (PhC)/ Clinical Pharmacy Specialist

## 2018-12-08 ENCOUNTER — Telehealth: Payer: Self-pay | Admitting: Pharmacist Clinician (PhC)/ Clinical Pharmacy Specialist

## 2018-12-08 DIAGNOSIS — I480 Paroxysmal atrial fibrillation: Secondary | ICD-10-CM

## 2018-12-08 DIAGNOSIS — Z7901 Long term (current) use of anticoagulants: Secondary | ICD-10-CM

## 2018-12-08 NOTE — Telephone Encounter (Signed)
New Message   Pt c/o medication issue:  1. Name of Medication: Eliquis   2. How are you currently taking this medication (dosage and times per day)? 21m 1 tablet by mouth twice a day  3. Are you having a reaction (difficulty breathing--STAT)?   4. What is your medication issue?  Patient taking Eliquis for the first time today and [facility]

## 2018-12-08 NOTE — Telephone Encounter (Signed)
Clarified to start Eliquis tomorrow morning.  No more warfarin.  Michele [name] voiced understanding

## 2018-12-10 DIAGNOSIS — Z7984 Long term (current) use of oral hypoglycemic drugs: Secondary | ICD-10-CM

## 2018-12-10 DIAGNOSIS — E119 Type 2 diabetes mellitus without complications: Secondary | ICD-10-CM

## 2018-12-10 DIAGNOSIS — M25562 Pain in left knee: Secondary | ICD-10-CM | POA: Diagnosis not present

## 2018-12-10 DIAGNOSIS — G4733 Obstructive sleep apnea (adult) (pediatric): Secondary | ICD-10-CM

## 2018-12-10 DIAGNOSIS — Z9981 Dependence on supplemental oxygen: Secondary | ICD-10-CM

## 2018-12-10 DIAGNOSIS — I2510 Atherosclerotic heart disease of native coronary artery without angina pectoris: Secondary | ICD-10-CM | POA: Diagnosis not present

## 2018-12-10 DIAGNOSIS — Z955 Presence of coronary angioplasty implant and graft: Secondary | ICD-10-CM

## 2018-12-10 DIAGNOSIS — Z7982 Long term (current) use of aspirin: Secondary | ICD-10-CM

## 2018-12-10 DIAGNOSIS — Z6841 Body Mass Index (BMI) 40.0 and over, adult: Secondary | ICD-10-CM

## 2018-12-10 DIAGNOSIS — I480 Paroxysmal atrial fibrillation: Secondary | ICD-10-CM

## 2018-12-10 DIAGNOSIS — J441 Chronic obstructive pulmonary disease with (acute) exacerbation: Secondary | ICD-10-CM | POA: Diagnosis not present

## 2018-12-10 DIAGNOSIS — Z7901 Long term (current) use of anticoagulants: Secondary | ICD-10-CM

## 2018-12-10 DIAGNOSIS — F319 Bipolar disorder, unspecified: Secondary | ICD-10-CM

## 2018-12-10 DIAGNOSIS — D509 Iron deficiency anemia, unspecified: Secondary | ICD-10-CM

## 2018-12-10 DIAGNOSIS — M1991 Primary osteoarthritis, unspecified site: Secondary | ICD-10-CM

## 2018-12-10 DIAGNOSIS — Z9181 History of falling: Secondary | ICD-10-CM

## 2018-12-10 DIAGNOSIS — J9621 Acute and chronic respiratory failure with hypoxia: Secondary | ICD-10-CM | POA: Diagnosis not present

## 2018-12-12 MED FILL — ISOSORBIDE MN ER 30 MG TAB: 30 | 90 days supply | Qty: 90 | Fill #0

## 2018-12-12 MED FILL — PANTOPRAZOLE SOD DR 40 MG T: 40 | 90 days supply | Qty: 90 | Fill #0

## 2018-12-13 ENCOUNTER — Telehealth: Payer: Self-pay | Admitting: Cardiovascular Disease

## 2018-12-13 ENCOUNTER — Ambulatory Visit (HOSPITAL_COMMUNITY): Payer: Medicare Other | Admitting: Licensed Clinical Social Worker

## 2018-12-13 ENCOUNTER — Other Ambulatory Visit: Payer: Self-pay

## 2018-12-13 DIAGNOSIS — F319 Bipolar disorder, unspecified: Secondary | ICD-10-CM | POA: Diagnosis not present

## 2018-12-13 DIAGNOSIS — I480 Paroxysmal atrial fibrillation: Secondary | ICD-10-CM | POA: Diagnosis not present

## 2018-12-13 DIAGNOSIS — J9621 Acute and chronic respiratory failure with hypoxia: Secondary | ICD-10-CM | POA: Diagnosis not present

## 2018-12-13 DIAGNOSIS — J441 Chronic obstructive pulmonary disease with (acute) exacerbation: Secondary | ICD-10-CM | POA: Diagnosis not present

## 2018-12-13 DIAGNOSIS — M1991 Primary osteoarthritis, unspecified site: Secondary | ICD-10-CM | POA: Diagnosis not present

## 2018-12-13 DIAGNOSIS — E119 Type 2 diabetes mellitus without complications: Secondary | ICD-10-CM | POA: Diagnosis not present

## 2018-12-13 DIAGNOSIS — D509 Iron deficiency anemia, unspecified: Secondary | ICD-10-CM | POA: Diagnosis not present

## 2018-12-13 DIAGNOSIS — I2510 Atherosclerotic heart disease of native coronary artery without angina pectoris: Secondary | ICD-10-CM | POA: Diagnosis not present

## 2018-12-13 DIAGNOSIS — M25562 Pain in left knee: Secondary | ICD-10-CM | POA: Diagnosis not present

## 2018-12-13 DIAGNOSIS — G4733 Obstructive sleep apnea (adult) (pediatric): Secondary | ICD-10-CM | POA: Diagnosis not present

## 2018-12-13 NOTE — Telephone Encounter (Signed)
Okay to take low-dose aspirin and Coumadin given her A. fib and known CAD

## 2018-12-13 NOTE — Telephone Encounter (Signed)
Long term antiplatelet use will need to be determined by MD - pt does have CAD as well as afib.

## 2018-12-13 NOTE — Telephone Encounter (Signed)
Michele [name] called wanting to know if patient should still take aspirin as well as Eliquis.

## 2018-12-14 ENCOUNTER — Other Ambulatory Visit: Payer: Self-pay

## 2018-12-14 ENCOUNTER — Ambulatory Visit (INDEPENDENT_AMBULATORY_CARE_PROVIDER_SITE_OTHER): Payer: Medicare Other | Admitting: Psychiatry

## 2018-12-14 DIAGNOSIS — F4310 Post-traumatic stress disorder, unspecified: Secondary | ICD-10-CM

## 2018-12-14 DIAGNOSIS — F331 Major depressive disorder, recurrent, moderate: Secondary | ICD-10-CM

## 2018-12-14 MED ORDER — BENZTROPINE MESYLATE 0.5 MG PO TABS: 0.5000 mg | ORAL_TABLET | Freq: Every day | ORAL | 0 refills | Status: AC

## 2018-12-14 MED ORDER — SERTRALINE HCL 100 MG PO TABS: 200.0000 mg | ORAL_TABLET | Freq: Every day | ORAL | 0 refills | Status: DC

## 2018-12-14 MED ORDER — ARIPIPRAZOLE 5 MG PO TABS: 5.0000 mg | ORAL_TABLET | Freq: Every day | ORAL | 0 refills | Status: DC

## 2018-12-14 NOTE — Telephone Encounter (Signed)
Pt on Eliquis, Coumadin, and ASA. Okay?
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
## 2018-12-15 NOTE — Telephone Encounter (Signed)
I do not see that she is on Eliquis.  I see that she is on Coumadin.  Coumadin and low-dose aspirin is fine with me.  Please clarify

## 2018-12-18 ENCOUNTER — Telehealth: Payer: Self-pay | Admitting: Internal Medicine

## 2018-12-18 DIAGNOSIS — J9610 Chronic respiratory failure, unspecified whether with hypoxia or hypercapnia: Secondary | ICD-10-CM

## 2018-12-18 DIAGNOSIS — G4733 Obstructive sleep apnea (adult) (pediatric): Secondary | ICD-10-CM

## 2018-12-18 DIAGNOSIS — I480 Paroxysmal atrial fibrillation: Secondary | ICD-10-CM

## 2018-12-18 NOTE — Telephone Encounter (Signed)
Ok to order supplies as requested

## 2018-12-18 NOTE — Telephone Encounter (Signed)
Spoke with [employer]. She stated that the patient is in need of oxygen and CPAP tubing as well as a mask for her nebulizer machine. Per Jeani Hawking, patient has not had new tubing in quite some time and the tubing is starting to show signs of wear. Advised her I would need to ask Dr. Annamaria Boots for his permission for the order, she verbalized understanding.   Dr. Annamaria Boots, please advise if you are ok with [place] sending an order to [employer] for the patient to get new supplies. Thanks!

## 2018-12-19 ENCOUNTER — Encounter (HOSPITAL_COMMUNITY): Payer: Self-pay | Admitting: Licensed Clinical Social Worker

## 2018-12-19 ENCOUNTER — Ambulatory Visit (INDEPENDENT_AMBULATORY_CARE_PROVIDER_SITE_OTHER): Payer: Medicare Other | Admitting: Licensed Clinical Social Worker

## 2018-12-19 ENCOUNTER — Other Ambulatory Visit: Payer: Self-pay

## 2018-12-19 DIAGNOSIS — F330 Major depressive disorder, recurrent, mild: Secondary | ICD-10-CM

## 2018-12-19 NOTE — Progress Notes (Signed)
Virtual Visit via Telephone Note  I connected with Michele [name] on 12/19/18 at 12:30 PM EDT by telephone and verified that I am speaking with the correct person using two identifiers.   I discussed the limitations, risks, security and privacy concerns of performing an evaluation and management service by telephone and the availability of in person appointments. I also discussed with the patient that there may be a patient responsible charge related to this service. The patient expressed understanding and agreed to proceed.    Type of Therapy: Individual Therapy   Treatment Goals addressed: Improve Psychiatric Symptoms, elevate mood (increased self-esteem, increased self-compassion, increased interaction), improve unhelpful thought patterns, controlled behavior, moderate mood, deliberate speech and thought process(improved social functioning, healthy adjustment to living situation), Learn about diagnosis, healthy coping skills   Interventions: Motivational Interviewing, CBT, Grounding & Mindfulness Techniques, psychoeducation   Summary: Michele [name] is a 68 y.o. female who presents with Major Depressive Disorder, recurrent episode, mild   Suicidal/Homicidal: No - without intent/plan   Therapist Response:  Enid Derry met with clinician for an individual session. Indiya discussed her psychiatric symptoms, her current life events and her homework. Jamal shared that she has been a little more depressed lately, as she continues to stay indoors to avoid illness. Clinician explored daily routine and noted the emphasis on her exercises and breathing treatments. Jourdan reports frustration at times due to memory loss. She reports she forgets words and loses track of what she is saying. She also identified a few falls that have happened over the past month. Clinician explored safety precautions and assistance available. Navaeh reports her aid will have to reduce her hours due to  Tamanika's lack of income. Clinician explored options of seeking support from Medicare, as well as researching whether or not Anitha is eligible for any other benefits from [facility], such as food stamps or in home caregiving.   Plan: Return again in 1- 2 week   Diagnosis: Axis I: Major Depressive Disorder, recurrent episode, mild    I discussed the assessment and treatment plan with the patient. The patient was provided an opportunity to ask questions and all were answered. The patient agreed with the plan and demonstrated an understanding of the instructions.   The patient was advised to call back or seek an in-person evaluation if the symptoms worsen or if the condition fails to improve as anticipated.  I provided 40 minutes of non-face-to-face time during this encounter.   Mindi Curling, LCSW

## 2018-12-19 NOTE — Addendum Note (Signed)
Addended by: Stephanie Coup on: 12/19/2018 11:13 AM   Modules accepted: Orders

## 2018-12-19 NOTE — Telephone Encounter (Signed)
DME order placed. Nothing further needed.

## 2018-12-20 ENCOUNTER — Telehealth: Payer: Self-pay

## 2018-12-20 DIAGNOSIS — A419 Sepsis, unspecified organism: Secondary | ICD-10-CM | POA: Diagnosis not present

## 2018-12-20 DIAGNOSIS — G4733 Obstructive sleep apnea (adult) (pediatric): Secondary | ICD-10-CM | POA: Diagnosis not present

## 2018-12-20 DIAGNOSIS — J45909 Unspecified asthma, uncomplicated: Secondary | ICD-10-CM | POA: Diagnosis not present

## 2018-12-20 NOTE — Telephone Encounter (Signed)
I called pt to see if she has ever taken a statin medication. Left pt v/m to give me a call back. YRL,RMA

## 2018-12-21 ENCOUNTER — Telehealth: Payer: Self-pay

## 2018-12-21 NOTE — Telephone Encounter (Signed)

## 2018-12-22 ENCOUNTER — Ambulatory Visit: Payer: Self-pay | Admitting: Internal Medicine

## 2018-12-26 ENCOUNTER — Other Ambulatory Visit: Payer: Self-pay | Admitting: Cardiology

## 2018-12-26 ENCOUNTER — Ambulatory Visit: Payer: Self-pay | Admitting: Internal Medicine

## 2018-12-26 ENCOUNTER — Encounter: Payer: Self-pay | Admitting: Internal Medicine

## 2018-12-26 ENCOUNTER — Ambulatory Visit: Payer: Medicare Other | Admitting: Internal Medicine

## 2018-12-26 ENCOUNTER — Other Ambulatory Visit: Payer: Self-pay

## 2018-12-26 VITALS — BP 112/64 | HR 81 | Temp 97.5°F | Ht 61.5 in | Wt 295.0 lb

## 2018-12-26 DIAGNOSIS — J9621 Acute and chronic respiratory failure with hypoxia: Secondary | ICD-10-CM

## 2018-12-26 DIAGNOSIS — J449 Chronic obstructive pulmonary disease, unspecified: Secondary | ICD-10-CM | POA: Diagnosis not present

## 2018-12-26 DIAGNOSIS — K922 Gastrointestinal hemorrhage, unspecified: Secondary | ICD-10-CM | POA: Diagnosis not present

## 2018-12-26 DIAGNOSIS — G4733 Obstructive sleep apnea (adult) (pediatric): Secondary | ICD-10-CM

## 2018-12-26 LAB — CBC WITH DIFFERENTIAL/PLATELET
Basophils Absolute: 0.1 10*3/uL (ref 0.0–0.1)
Basophils Relative: 1.2 % (ref 0.0–3.0)
Eosinophils Absolute: 0.5 10*3/uL (ref 0.0–0.7)
Eosinophils Relative: 7.9 % — ABNORMAL HIGH (ref 0.0–5.0)
HCT: 35.7 % — ABNORMAL LOW (ref 36.0–46.0)
Hemoglobin: 11 g/dL — ABNORMAL LOW (ref 12.0–15.0)
Lymphocytes Relative: 21.2 % (ref 12.0–46.0)
Lymphs Abs: 1.4 10*3/uL (ref 0.7–4.0)
MCHC: 30.9 g/dL (ref 30.0–36.0)
MCV: 69.9 fl — ABNORMAL LOW (ref 78.0–100.0)
Monocytes Absolute: 0.4 10*3/uL (ref 0.1–1.0)
Monocytes Relative: 6.6 % (ref 3.0–12.0)
Neutro Abs: 4.2 10*3/uL (ref 1.4–7.7)
Neutrophils Relative %: 63.1 % (ref 43.0–77.0)
Platelets: 273 10*3/uL (ref 150.0–400.0)
RBC: 5.11 Mil/uL (ref 3.87–5.11)
RDW: 17.6 % — ABNORMAL HIGH (ref 11.5–15.5)
WBC: 6.7 10*3/uL (ref 4.0–10.5)

## 2018-12-26 LAB — BASIC METABOLIC PANEL
BUN: 17 mg/dL (ref 6–23)
CO2: 37 mEq/L — ABNORMAL HIGH (ref 19–32)
Calcium: 9.8 mg/dL (ref 8.4–10.5)
Chloride: 99 mEq/L (ref 96–112)
Creatinine, Ser: 0.77 mg/dL (ref 0.40–1.20)
GFR: 90.24 mL/min (ref >60.00)
Glucose, Bld: 89 mg/dL (ref 70–99)
Potassium: 3.6 mEq/L (ref 3.5–5.1)
Sodium: 144 mEq/L (ref 135–145)

## 2018-12-26 MED ORDER — METHYLPREDNISOLONE ACETATE 80 MG/ML IJ SUSP
80.0000 mg | Freq: Once | INTRAMUSCULAR | Status: AC
  Administered 2018-12-26: 80 mg via INTRAMUSCULAR

## 2018-12-26 MED FILL — AMLODIPINE BESYLATE 5 MG TA: 5 | 30 days supply | Qty: 30 | Fill #1

## 2018-12-26 NOTE — Progress Notes (Signed)
Patient ID: Michele [name], female    DOB: [birth year], 68 y.o.   MRN: [id number]  HPI F never smoker. Followed here for obstructive sleep apnea, allergic rhinitis, Asthma/COPD complicated by obesity/hypotension, GERD and CAD.  NPSG 07/16/16-AHI 56.9/hour, oxygen desaturation 80%, body weight 297 pounds Office Spirometry 04/25/2018-very severe obstructive airways disease-FVC 0.7/30%, FEV1 0.4/23%, ratio 0.60/77% ----------------------------------------------------------------------------------  08/23/2018- 68 year old female never smoker followed for OSA/failed CPAP, allergic rhinitis, Asthma/COPD, complicated by obesity/hypoventilation, GERD, CAD/stents/warfarin/ AFib, CVA, chronic anemia, depression, DM 2, HBP,            Here with "caregiver" O2 2 L  [employer] CPAP auto 5-15/Advanced. Still has, but rarely tries to use it. Hospital follow-up: 1/14-1/23/2020.  Principal diagnosis COPD exacerbation with acute on chronic hypoxic respiratory failure.  Transaminases were elevated but catheterization done, recommending medical management. She asks about a light portable oxygen tank-discussed. Walk test 08/23/2018-room air-3 slow laps using a cane "legs weak".  Some dyspnea and wheeze by end of walk.  Lowest saturation 90%, maximum heart rate 114.  12/26/2018- 68 year old female never smoker followed for OSA/failed CPAP, allergic rhinitis, Asthma/COPD, complicated by obesity/hypoventilation, GERD, CAD/stents/Eliquis AFib, CVA, chronic anemia, depression, DM 2, HBP,      O2 2 L  [employer] CPAP auto 5-15/Advanced. Still has, but rarely tries to use it -----C/O increased SOB and cough that comes and goes. Pt thinks may be due to weather changes. On O2 @ 2L prn is needing it at least 1xqd. Using rescue inhaler about 2xqd. Body weight today 295 lbs Says she is using CPAP off and on. No download. Averaging 3 "episodes"/ week  Of wheezing dyspnea. Nebulizer helsp. C/o abd cramps. Having black stools on iron.  Colonoscopy Dr Collene Mares 6 months ago. Follows w Dr Julien Nordmann for anemia.  CXR 1V 08/01/2018- Negative chest.  ROS-see HPI + = positive Constitutional:   No-   weight loss, night sweats, fevers, chills, +fatigue, lassitude. HEENT:   No-  headaches, difficulty swallowing, tooth/dental problems, sore throat,       No-sneezing, itching, ear ache, nasal congestion, post nasal drip,  CV:  No-   chest pain, orthopnea, PND, swelling in lower extremities, anasarca, dizziness, palpitations Resp: +shortness of breath with exertion or at rest.            productive cough,  No non-productive cough,  No- coughing up of blood.              No-   change in color of mucus. No- wheezing.   Skin: No-   rash or lesions. GI:  No-   heartburn, indigestion, abdominal pain, nausea, vomiting,  GU:  MS:  No-   joint pain or swelling.   Neuro-     nothing unusual Psych:  No- change in mood or affect. No acute  depression or anxiety.  No memory loss.  Objective:  General- Alert, Oriented, Affect-appropriate, Distress- none acute; + morbidly obese Skin- . Lymphadenopathy- none Head- atraumatic            Eyes- Gross vision intact, PERRLA, conjunctivae clear secretions            Ears- Hearing, canals-normal            Nose- +clear, no- Septal dev,  polyps, erosion, perforation             Throat- Mallampati III-IV , mucosa clear , drainage- none, tonsils- atrophic; raspy voice Neck- flexible , trachea midline, no stridor , thyroid nl, carotid no bruit Chest -  symmetrical excursion , unlabored           Heart/CV- RRR- occ extra beat , no murmur , no gallop  , no rub, nl s1 s2                           - JVD- none , edema- none, stasis changes- none, varices- none           Lung- Wheeze +, , shallow, wheeze- none, cough- none , dullness-none, rub- none           Chest wall-  Abd-  Quiet, nontender Br/ Gen/ Rectal- Not done, not indicated Extrem- cyanosis- none, clubbing, none, atrophy- none, strength- nl; +walks with  cane Neuro- grossly intact to observation  Assessment & Plan:

## 2018-12-26 NOTE — Patient Instructions (Signed)
Order- DME [facility]-   Please replace old nebulizer machine   Dx Asthma/ COPD Overlap  Order- lab- CBC w diff, BMET      Order- depo 80      Dx Asthma/ COPD overlap  Ok to continue your current meds  Continue Oxygen 2L for sleep and as needed  Please call if we can help

## 2018-12-27 ENCOUNTER — Encounter (HOSPITAL_COMMUNITY): Payer: Self-pay | Admitting: Licensed Clinical Social Worker

## 2018-12-27 ENCOUNTER — Ambulatory Visit (INDEPENDENT_AMBULATORY_CARE_PROVIDER_SITE_OTHER): Payer: Medicare Other | Admitting: Licensed Clinical Social Worker

## 2018-12-27 DIAGNOSIS — J449 Chronic obstructive pulmonary disease, unspecified: Secondary | ICD-10-CM | POA: Diagnosis not present

## 2018-12-27 DIAGNOSIS — F330 Major depressive disorder, recurrent, mild: Secondary | ICD-10-CM

## 2018-12-27 DIAGNOSIS — J45909 Unspecified asthma, uncomplicated: Secondary | ICD-10-CM | POA: Diagnosis not present

## 2018-12-27 MED FILL — GABAPENTIN 300 MG CAPSULE: 300 | 30 days supply | Qty: 90 | Fill #0

## 2018-12-27 NOTE — Progress Notes (Signed)
Virtual Visit via Telephone Note  I connected with Michele [name] on 12/27/18 at 10:00 AM EDT by telephone and verified that I am speaking with the correct person using two identifiers.    I discussed the limitations, risks, security and privacy concerns of performing an evaluation and management service by telephone and the availability of in person appointments. I also discussed with the patient that there may be a patient responsible charge related to this service. The patient expressed understanding and agreed to proceed.  Type of Therapy: Individual Therapy   Treatment Goals addressed: Improve Psychiatric Symptoms, elevate mood (increased self-esteem, increased self-compassion, increased interaction), improve unhelpful thought patterns, controlled behavior, moderate mood, deliberate speech and thought process(improved social functioning, healthy adjustment to living situation), Learn about diagnosis, healthy coping skills   Interventions: Motivational Interviewing, CBT, Grounding & Mindfulness Techniques, psychoeducation   Summary: Michele [name] is a 68 y.o. female who presents with Major Depressive Disorder, recurrent episode, mild   Suicidal/Homicidal: No - without intent/plan   Therapist Response:  Michele [name] met with clinician for an individual session. Michele [name] discussed her psychiatric symptoms, her current life events and her homework. Michele [name] shared that she has been having ongoing problems with memory. Michele [name] reported recent decline in cognitive functions, including some hallucinations, confusion, and memory lapses. Clinician encouraged Michele [name] to reach out to doctor about these declines and to see if a neurologist would be helpful. Clinician identified the use of word puzzles, math games, or other puzzles for improving cognitive function. Clinician also discussed the usefulness of music in Michele [name] with memory and focus. Clinician spoke with Michele [name] about  her memory and noted concerns about her ability to remember short term and immediate information. Michele [name] also reported lack of focus and in session she lost track of what she was talking about a few times. Clinician identified that when other noise is in the room, such as tv or other people talking, she loses track of the conversation easier.  Otherwise, Michele [name] reports mood has been good. She has concerns about her physical health, breathing issues, and body pain. However, she is undergoing treatment with respiratory specialist and physical therapy.   Plan: Return again in 2 weeks  Diagnosis: Axis I: Major Depressive Disorder, recurrent episode, mild    I discussed the assessment and treatment plan with the patient. The patient was provided an opportunity to ask questions and all were answered. The patient agreed with the plan and demonstrated an understanding of the instructions.   The patient was advised to call back or seek an in-person evaluation if the symptoms worsen or if the condition fails to improve as anticipated.  I provided 45 minutes of non-face-to-face time during this encounter.   Mindi Curling, LCSW

## 2018-12-28 DIAGNOSIS — D509 Iron deficiency anemia, unspecified: Secondary | ICD-10-CM | POA: Diagnosis not present

## 2018-12-28 DIAGNOSIS — K219 Gastro-esophageal reflux disease without esophagitis: Secondary | ICD-10-CM | POA: Diagnosis not present

## 2018-12-28 DIAGNOSIS — R1013 Epigastric pain: Secondary | ICD-10-CM | POA: Diagnosis not present

## 2018-12-29 ENCOUNTER — Ambulatory Visit: Payer: Medicare Other | Admitting: Cardiovascular Disease

## 2018-12-29 ENCOUNTER — Encounter: Payer: Self-pay | Admitting: Cardiovascular Disease

## 2018-12-29 ENCOUNTER — Other Ambulatory Visit: Payer: Self-pay

## 2018-12-29 DIAGNOSIS — Z9861 Coronary angioplasty status: Secondary | ICD-10-CM

## 2018-12-29 DIAGNOSIS — E785 Hyperlipidemia, unspecified: Secondary | ICD-10-CM

## 2018-12-29 DIAGNOSIS — I2510 Atherosclerotic heart disease of native coronary artery without angina pectoris: Secondary | ICD-10-CM | POA: Diagnosis not present

## 2018-12-29 DIAGNOSIS — I480 Paroxysmal atrial fibrillation: Secondary | ICD-10-CM | POA: Diagnosis not present

## 2018-12-29 DIAGNOSIS — I10 Essential (primary) hypertension: Secondary | ICD-10-CM | POA: Diagnosis not present

## 2018-12-29 MED FILL — ELIQUIS 5 MG TABLET: 5 | 30 days supply | Qty: 60 | Fill #1

## 2018-12-29 NOTE — Assessment & Plan Note (Signed)
History of CAD status post remote RCA stenting in 2008.  She had a Myoview stress test though not as nonischemic 12/20/2012.  She was admitted in January with chest pain and underwent Myoview stress testing which led to a cardiac catheterization performed Dr. Tamala Julian revealing in-stent restenosis within the proximal RCA stent as well as diffuse RCA disease with noncritical disease in the left system and normal LV function.  Medical therapy was recommended.  Patient currently denies chest pain or shortness of breath.

## 2018-12-29 NOTE — Assessment & Plan Note (Signed)
History of essential hypertension with blood pressure measured today at 106/74.  He is on hydrochlorothiazide and metoprolol.

## 2018-12-29 NOTE — Patient Instructions (Signed)
Medication Instructions:  Your physician recommends that you continue on your current medications as directed. Please refer to the Current Medication list given to you today.  If you need a refill on your cardiac medications before your next appointment, please call your pharmacy.   Lab work: NONE If you have labs (blood work) drawn today and your tests are completely normal, you will receive your results only by: Marland Kitchen MyChart Message (if you have MyChart) OR . A paper copy in the mail If you have any lab test that is abnormal or we need to change your treatment, we will call you to review the results.  Testing/Procedures: NONE  Follow-Up: At [facility], you and your health needs are our priority.  As part of our continuing mission to provide you with exceptional heart care, we have created designated Provider Care Teams.  These Care Teams include your primary Cardiologist (physician) and Advanced Practice Providers (APPs -  Physician Assistants and Nurse Practitioners) who all work together to provide you with the care you need, when you need it. You will need a follow up appointment in 6 months WITH AN APP AND IN 12 months WITH DR. Gwenlyn Found.  Please call our office 2 months in advance to schedule each appointment.

## 2018-12-29 NOTE — Assessment & Plan Note (Signed)
History of dyslipidemia on statin therapy with lipid profile performed 12/07/2017 revealing total cholesterol 152, LDL 40 and HDL of 68.

## 2018-12-29 NOTE — Assessment & Plan Note (Signed)
History of paroxysmal atrial fibrillation on Eliquis oral anticoagulation.

## 2018-12-29 NOTE — Progress Notes (Signed)
12/29/2018 Michele [name]   Jan 02, 1951  [id number]  Primary Physician [name], MD Primary Cardiologist: Lorretta Harp MD FACP, [place], [place], [place]  HPI:  Michele [name] is a 68 y.o.  morbidly obese and who has a history of coronary disease status post stent to the proximal RCA in 2008. Her history also includes diabetes mellitus type 2, hypertension, sleep apnea, asthma, paroxysmal intrafibrillation, hyperlipidemia, arthritis. I last saw her in the office  01/03/2018. Her last nuclear stress test was 12/20/12 was nonischemic. She has normal LV function.. She has occasional atypical chest pain as well as dyspnea on exertion when walking up an incline.. Since I saw her a year ago she's remained chronically stable without change of her symptoms in either frequency or severity. She does have a history of paroxysmal A. Fib maintaining sinus rhythm on Coumadin anticoagulation. We talked about weight reduction which has been on major problem with her over the years. Unfortunately that she has not been able to lose weight despite seeing a dietitian. Since I saw her over a year ago she's remained clinically stableuntil September when she was admitted to [facility] with altered mental status and metabolic encephalopathy. Since I saw her in the office 9 months ago she is remained stable.  She denies chest pain or shortness of breath.  She was hospitalized in January with chest pain and underwent Myoview stress testing that suggested inferior ischemia which led to a diagnostic cardiac cath by Dr. Tamala Julian 08/09/2018 revealing 80% in-stent restenosis within the proximal RCA stent, diffuse disease after that and nonobstructive disease in the LAD and circumflex with low normal LV function of 50 mildly elevated LVEDP.  Medical therapy was recommended.  Since that time she is continued to do well.  Her major complaint is that of abdominal pain.  She denies chest pain or  shortness of breath.  Current Meds  Medication Sig  . ACCU-CHEK GUIDE test strip insert 1 by Subcutaneous route every day check blood sugar before breakfast and dinner  . ADVAIR DISKUS 250-50 MCG/DOSE AEPB INHALE ONE PUFF TWICE DAILY  . albuterol (VENTOLIN HFA) 108 (90 Base) MCG/ACT inhaler Inhale 2 puffs into the lungs every 6 (six) hours as needed for wheezing or shortness of breath.  Marland Kitchen amLODipine (NORVASC) 5 MG tablet Take 1 tablet (5 mg total) by mouth daily.  Marland Kitchen apixaban (ELIQUIS) 5 MG TABS tablet Take 1 tablet (5 mg total) by mouth 2 (two) times daily.  . ARIPiprazole (ABILIFY) 5 MG tablet Take 1 tablet (5 mg total) by mouth daily.  Marland Kitchen aspirin EC 81 MG EC tablet Take 1 tablet (81 mg total) by mouth daily.  . benztropine (COGENTIN) 0.5 MG tablet Take 1 tablet (0.5 mg total) by mouth at bedtime.  . FeFum-FePoly-FA-B Cmp-C-Biot (INTEGRA PLUS) CAPS Take 1 capsule by mouth once daily in the morning  . gabapentin (NEURONTIN) 300 MG capsule Take 300 mg by mouth 3 (three) times daily as needed (for pain).   . hydrochlorothiazide (HYDRODIURIL) 25 MG tablet TAKE ONE-HALF TABLET (12.5 MG TOTAL) BY MOUTH DAILY FOR 30 DAYS.  Marland Kitchen ipratropium-albuterol (DUONEB) 0.5-2.5 (3) MG/3ML SOLN USE 3 ML IN NEBULIZER EVERY 6 HOURS AS NEEDED DX: J45.20  . isosorbide mononitrate (IMDUR) 30 MG 24 hr tablet TAKE 1 TABLET (30 MG TOTAL) BY MOUTH DAILY.  Marland Kitchen MAGNESIUM GLUCONATE PO Take by mouth.  . metFORMIN (GLUCOPHAGE-XR) 500 MG 24 hr tablet TAKE ONE (1) TABLET BY MOUTH EVERY DAY  WITH BREAKFAST  . metoprolol tartrate (LOPRESSOR) 25 MG tablet Take 1 tablet (25 mg total) by mouth 2 (two) times daily.  . nitroGLYCERIN (NITROSTAT) 0.4 MG SL tablet PLACE 1 TABLET UNDER THE TONGUE EVERY 5 MINUTES AS NEEDED FOR CHEST PAIN.  MAY REPEAT 3 TIMES.  IF NO RELIEF CALL DOCTOR  . OXYGEN Inhale 2 L into the lungs as needed (for shortness of breath).   . pantoprazole (PROTONIX) 40 MG tablet TAKE 1 TABLET (40 MG TOTAL) BY MOUTH DAILY. TAKE  TWICE A DAY BEFORE MEALS FOR 8 WEEKS, THEN ONCE A DAY  . rosuvastatin (CRESTOR) 10 MG tablet TAKE ONE (1) TABLET BY MOUTH EVERY DAY  . sertraline (ZOLOFT) 100 MG tablet Take 2 tablets (200 mg total) by mouth daily.     Allergies  Allergen Reactions  . Meprobamate Nausea And Vomiting    Social History   Socioeconomic History  . Marital status: Divorced    Spouse name: Not on file  . Number of children: 2  . Years of education: 44  . Highest education level: Not on file  Occupational History  . Occupation: NURSING [place]    Employer: [employer]    Comment: Retired  Scientific laboratory technician  . Financial resource strain: Not on file  . Food insecurity    Worry: Not on file    Inability: Not on file  . Transportation needs    Medical: Not on file    Non-medical: Not on file  Tobacco Use  . Smoking status: Never Smoker  . Smokeless tobacco: Never Used  Substance and Sexual Activity  . Alcohol use: No    Alcohol/week: 0.0 standard drinks  . Drug use: No  . Sexual activity: Not Currently  Lifestyle  . Physical activity    Days per week: Not on file    Minutes per session: Not on file  . Stress: Not on file  Relationships  . Social Herbalist on phone: Not on file    Gets together: Not on file    Attends religious service: Not on file    Active member of club or organization: Not on file    Attends meetings of clubs or organizations: Not on file    Relationship status: Not on file  . Intimate partner violence    Fear of current or ex partner: Not on file    Emotionally abused: Not on file    Physically abused: Not on file    Forced sexual activity: Not on file  Other Topics Concern  . Not on file  Social History Narrative   Lives at home alone.   Right-handed.   Occasional caffeine use.   Retired [employer] since 2014. (Rehab).       Review of Systems: General: negative for chills, fever, night sweats or weight changes.  Cardiovascular: negative for  chest pain, dyspnea on exertion, edema, orthopnea, palpitations, paroxysmal nocturnal dyspnea or shortness of breath Dermatological: negative for rash Respiratory: negative for cough or wheezing Urologic: negative for hematuria Abdominal: negative for nausea, vomiting, diarrhea, bright red blood per rectum, melena, or hematemesis Neurologic: negative for visual changes, syncope, or dizziness All other systems reviewed and are otherwise negative except as noted above.    Blood pressure 106/74, pulse 70, temperature 97.7 F (36.5 C), height 5' 1.5" (1.562 m), weight 298 lb (135.2 kg), SpO2 92 %.  General appearance: alert and no distress Neck: no adenopathy, no carotid bruit, no JVD, supple, symmetrical,  trachea midline and thyroid not enlarged, symmetric, no tenderness/mass/nodules Lungs: clear to auscultation bilaterally Heart: regular rate and rhythm, S1, S2 normal, no murmur, click, rub or gallop Extremities: extremities normal, atraumatic, no cyanosis or edema Pulses: 2+ and symmetric Skin: Skin color, texture, turgor normal. No rashes or lesions Neurologic: Alert and oriented X 3, normal strength and tone. Normal symmetric reflexes. Normal coordination and gait  EKG not performed today  ASSESSMENT AND PLAN:   Essential hypertension History of essential hypertension with blood pressure measured today at 106/74.  He is on hydrochlorothiazide and metoprolol.  CAD S/P percutaneous coronary angioplasty History of CAD status post remote RCA stenting in 2008.  She had a Myoview stress test though not as nonischemic 12/20/2012.  She was admitted in January with chest pain and underwent Myoview stress testing which led to a cardiac catheterization performed Dr. Tamala Julian revealing in-stent restenosis within the proximal RCA stent as well as diffuse RCA disease with noncritical disease in the left system and normal LV function.  Medical therapy was recommended.  Patient currently denies chest pain  or shortness of breath.  AF (paroxysmal atrial fibrillation) (HCC) History of paroxysmal atrial fibrillation on Eliquis oral anticoagulation.  Dyslipidemia, goal LDL below 70 History of dyslipidemia on statin therapy with lipid profile performed 12/07/2017 revealing total cholesterol 152, LDL 40 and HDL of 68.      Lorretta Harp MD FACP,FACC,FAHA, [facility] 12/29/2018 11:03 AM

## 2019-01-01 ENCOUNTER — Encounter: Payer: Self-pay | Admitting: Internal Medicine

## 2019-01-01 ENCOUNTER — Other Ambulatory Visit: Payer: Self-pay

## 2019-01-01 ENCOUNTER — Inpatient Hospital Stay: Payer: Medicare Other

## 2019-01-01 ENCOUNTER — Inpatient Hospital Stay: Payer: Medicare Other | Attending: Internal Medicine | Admitting: Internal Medicine

## 2019-01-01 ENCOUNTER — Telehealth: Payer: Self-pay | Admitting: Internal Medicine

## 2019-01-01 VITALS — BP 141/81 | HR 73 | Temp 97.8°F | Resp 20 | Ht 61.5 in | Wt 292.7 lb

## 2019-01-01 DIAGNOSIS — E669 Obesity, unspecified: Secondary | ICD-10-CM | POA: Insufficient documentation

## 2019-01-01 DIAGNOSIS — Z79899 Other long term (current) drug therapy: Secondary | ICD-10-CM | POA: Insufficient documentation

## 2019-01-01 DIAGNOSIS — E119 Type 2 diabetes mellitus without complications: Secondary | ICD-10-CM | POA: Insufficient documentation

## 2019-01-01 DIAGNOSIS — Z7901 Long term (current) use of anticoagulants: Secondary | ICD-10-CM | POA: Diagnosis not present

## 2019-01-01 DIAGNOSIS — I10 Essential (primary) hypertension: Secondary | ICD-10-CM | POA: Insufficient documentation

## 2019-01-01 DIAGNOSIS — D509 Iron deficiency anemia, unspecified: Secondary | ICD-10-CM

## 2019-01-01 DIAGNOSIS — Z8673 Personal history of transient ischemic attack (TIA), and cerebral infarction without residual deficits: Secondary | ICD-10-CM | POA: Diagnosis not present

## 2019-01-01 DIAGNOSIS — R109 Unspecified abdominal pain: Secondary | ICD-10-CM | POA: Diagnosis not present

## 2019-01-01 LAB — IRON AND TIBC
Iron: 57 ug/dL (ref 41–142)
Saturation Ratios: 18 % — ABNORMAL LOW (ref 21–57)
TIBC: 313 ug/dL (ref 236–444)
UIBC: 255 ug/dL (ref 120–384)

## 2019-01-01 LAB — CBC WITH DIFFERENTIAL (CANCER CENTER ONLY)
Abs Immature Granulocytes: 0.01 10*3/uL (ref 0.00–0.07)
Basophils Absolute: 0.1 10*3/uL (ref 0.0–0.1)
Basophils Relative: 1 %
Eosinophils Absolute: 0.4 10*3/uL (ref 0.0–0.5)
Eosinophils Relative: 5 %
HCT: 38.3 % (ref 36.0–46.0)
Hemoglobin: 11 g/dL — ABNORMAL LOW (ref 12.0–15.0)
Immature Granulocytes: 0 %
Lymphocytes Relative: 22 %
Lymphs Abs: 1.4 10*3/uL (ref 0.7–4.0)
MCH: 21.1 pg — ABNORMAL LOW (ref 26.0–34.0)
MCHC: 28.7 g/dL — ABNORMAL LOW (ref 30.0–36.0)
MCV: 73.5 fL — ABNORMAL LOW (ref 80.0–100.0)
Monocytes Absolute: 0.5 10*3/uL (ref 0.1–1.0)
Monocytes Relative: 8 %
Neutro Abs: 4.2 10*3/uL (ref 1.7–7.7)
Neutrophils Relative %: 64 %
Platelet Count: 306 10*3/uL (ref 150–400)
RBC: 5.21 MIL/uL — ABNORMAL HIGH (ref 3.87–5.11)
RDW: 17.7 % — ABNORMAL HIGH (ref 11.5–15.5)
WBC Count: 6.6 10*3/uL (ref 4.0–10.5)
nRBC: 0 % (ref 0.0–0.2)

## 2019-01-01 LAB — FERRITIN: Ferritin: 234 ng/mL (ref 11–307)

## 2019-01-01 NOTE — Progress Notes (Signed)
[facility] [phone]   [phone]  OFFICE PROGRESS NOTE  [name], [address] [place] [address]  DIAGNOSIS: Unspecified anemia questionable for anemia of chronic disease/iron deficiency.   PRIOR THERAPY: Status post Feraheme infusion 510 mg IV weekly on as-needed basis.  Last dose was given August 12, 2017.  CURRENT THERAPY: Integra +1 capsule p.o. daily.  INTERVAL HISTORY: Michele [name] 68 y.o. female returns to the clinic today for follow-up visit.  The patient is feeling fine today with no concerning complaints except for bloating and intermittent abdominal pain.  She denied having any nausea or vomiting.  She has no chest pain, shortness of breath, cough or hemoptysis.  She denied having any fever or chills.  The patient continues to tolerate her treatment with Integra plus capsule fairly well.  She is here today for evaluation and repeat CBC, iron study and ferritin.   MEDICAL HISTORY: Past Medical History:  Diagnosis Date  . Allergic rhinitis   . ARF (acute renal failure) ([place]) 03/2017  . 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 Asthma   . CAD (coronary artery disease) 01/30/2007   stents trivial coronary artery disease diffusely, with a recent deployment od a intracoronary artery stent, 3.5 x 12 mm driver stent with no more than 20-30% in- stents restenosis.done by Dr [name] with re-look on novenber 10 2008 revealing a widely patent stent with otherwise trival CAD and normal LV function  . Carpal tunnel syndrome, right   . Chest pain 07/17/12009   2 D Echo EF >55%  . Chronic lower back pain   . Complication of anesthesia    slow to wake up  . COPD (chronic obstructive pulmonary disease) ([place])   . DDD (degenerative disc disease), lumbar    multilevel lumbar degenerative disc disease  . Depression   . Diabetes mellitus type II   . Gait instability   . GERD  (gastroesophageal reflux disease)   . History of kidney stones   . History of stomach ulcers    "years ago" (08/01/2018)  . Hyperlipidemia   . Hypertension   . IBS (irritable bowel syndrome)   . Iron deficiency anemia 07/13/2016  . Obesity   . On home oxygen therapy    "2L 24/7" (08/01/2018)  . OSA on CPAP    "w/O2" (08/01/2018)  . Paroxysmal A-fib ([place])   . PTSD (post-traumatic stress disorder)   . Stroke [facility])    "not sure when; showed up on one of my tests" (08/01/2018)  . TIA (transient ischemic attack)    "several; they say I have small ones" (08/01/2018)  . Urinary incontinence     ALLERGIES:  is allergic to meprobamate.  MEDICATIONS:  Current Outpatient Medications  Medication Sig Dispense Refill  . ACCU-CHEK GUIDE test strip insert 1 by Subcutaneous route every day check blood sugar before breakfast and dinner 300 each 2  . ADVAIR DISKUS 250-50 MCG/DOSE AEPB INHALE ONE PUFF TWICE DAILY 60 each 5  . albuterol (VENTOLIN HFA) 108 (90 Base) MCG/ACT inhaler Inhale 2 puffs into the lungs every 6 (six) hours as needed for wheezing or shortness of breath.    Marland Kitchen amLODipine (NORVASC) 5 MG tablet Take 1 tablet (5 mg total) by mouth daily. 30 tablet 6  . apixaban (ELIQUIS) 5 MG TABS tablet Take 1 tablet (5 mg total) by mouth 2 (two) times daily. 60 tablet 5  .  ARIPiprazole (ABILIFY) 5 MG tablet Take 1 tablet (5 mg total) by mouth daily. 90 tablet 0  . aspirin EC 81 MG EC tablet Take 1 tablet (81 mg total) by mouth daily.    . benztropine (COGENTIN) 0.5 MG tablet Take 1 tablet (0.5 mg total) by mouth at bedtime. 90 tablet 0  . FeFum-FePoly-FA-B Cmp-C-Biot (INTEGRA PLUS) CAPS Take 1 capsule by mouth once daily in the morning 30 capsule 0  . gabapentin (NEURONTIN) 300 MG capsule Take 300 mg by mouth 3 (three) times daily as needed (for pain).   0  . hydrochlorothiazide (HYDRODIURIL) 25 MG tablet TAKE ONE-HALF TABLET (12.5 MG TOTAL) BY MOUTH DAILY FOR 30 DAYS. 30 tablet 6  .  ipratropium-albuterol (DUONEB) 0.5-2.5 (3) MG/3ML SOLN USE 3 ML IN NEBULIZER EVERY 6 HOURS AS NEEDED DX: J45.20 360 mL 5  . isosorbide mononitrate (IMDUR) 30 MG 24 hr tablet TAKE 1 TABLET (30 MG TOTAL) BY MOUTH DAILY. 90 tablet 0  . MAGNESIUM GLUCONATE PO Take by mouth.    . metFORMIN (GLUCOPHAGE-XR) 500 MG 24 hr tablet TAKE ONE (1) TABLET BY MOUTH EVERY DAY WITH BREAKFAST 90 tablet 1  . metoprolol tartrate (LOPRESSOR) 25 MG tablet Take 1 tablet (25 mg total) by mouth 2 (two) times daily. 180 tablet 3  . nitroGLYCERIN (NITROSTAT) 0.4 MG SL tablet PLACE 1 TABLET UNDER THE TONGUE EVERY 5 MINUTES AS NEEDED FOR CHEST PAIN.  MAY REPEAT 3 TIMES.  IF NO RELIEF CALL DOCTOR (Patient not taking: Reported on 01/01/2019) 25 tablet 11  . OXYGEN Inhale 2 L into the lungs as needed (for shortness of breath).     . pantoprazole (PROTONIX) 40 MG tablet TAKE 1 TABLET (40 MG TOTAL) BY MOUTH DAILY. TAKE TWICE A DAY BEFORE MEALS FOR 8 WEEKS, THEN ONCE A DAY 90 tablet 1  . rosuvastatin (CRESTOR) 10 MG tablet TAKE ONE (1) TABLET BY MOUTH EVERY DAY 90 tablet 1  . sertraline (ZOLOFT) 100 MG tablet Take 2 tablets (200 mg total) by mouth daily. 180 tablet 0   No current facility-administered medications for this visit.     SURGICAL HISTORY:  Past Surgical History:  Procedure Laterality Date  . CARDIAC CATHETERIZATION    . CHOLECYSTECTOMY    . COLONOSCOPY WITH PROPOFOL N/A 11/18/2017   Procedure: COLONOSCOPY WITH PROPOFOL;  Surgeon: Carol Ada, MD;  Location: [facility];  Service: Endoscopy;  Laterality: N/A;  . CORONARY ANGIOPLASTY WITH STENT PLACEMENT    . CYST EXCISION Left    groin  . CYSTOSCOPY W/ STONE MANIPULATION    . ESOPHAGOGASTRODUODENOSCOPY N/A 09/25/2017   Procedure: ESOPHAGOGASTRODUODENOSCOPY (EGD);  Surgeon: Clarene Essex, MD;  Location: [facility];  Service: Endoscopy;  Laterality: N/A;  . EXCISIONAL HEMORRHOIDECTOMY    . LEFT HEART CATH AND CORONARY ANGIOGRAPHY N/A 08/09/2018   Procedure: LEFT  HEART CATH AND CORONARY ANGIOGRAPHY;  Surgeon: Belva Crome, MD;  Location: [facility];  Service: Cardiovascular;  Laterality: N/A;  . stress myocardial perfusion study  09/08/2010   normal; EF 75%    REVIEW OF SYSTEMS:  A comprehensive review of systems was negative except for: Constitutional: positive for fatigue   PHYSICAL EXAMINATION: General appearance: alert, cooperative, fatigued and no distress Head: Normocephalic, without obvious abnormality, atraumatic Neck: no adenopathy, no JVD, supple, symmetrical, trachea midline and thyroid not enlarged, symmetric, no tenderness/mass/nodules Lymph nodes: Cervical, supraclavicular, and axillary nodes normal. Resp: clear to auscultation bilaterally Back: symmetric, no curvature. ROM normal. No CVA tenderness. Cardio: regular rate  and rhythm, S1, S2 normal, no murmur, click, rub or gallop GI: soft, non-tender; bowel sounds normal; no masses,  no organomegaly Extremities: extremities normal, atraumatic, no cyanosis or edema  ECOG PERFORMANCE STATUS: 1 - Symptomatic but completely ambulatory  Blood pressure (!) 141/81, pulse 73, temperature 97.8 F (36.6 C), temperature source Oral, resp. rate 20, height 5' 1.5" (1.562 m), weight 292 lb 11.2 oz (132.8 kg).  LABORATORY DATA: Lab Results  Component Value Date   WBC 6.6 01/01/2019   HGB 11.0 (L) 01/01/2019   HCT 38.3 01/01/2019   MCV 73.5 (L) 01/01/2019   PLT 306 01/01/2019      Chemistry      Component Value Date/Time   NA 144 12/26/2018 1054   NA 144 11/16/2018 1219   NA 144 10/02/2012 0905   K 3.6 12/26/2018 1054   K 3.4 (L) 10/02/2012 0905   CL 99 12/26/2018 1054   CL 101 10/02/2012 0905   CO2 37 (H) 12/26/2018 1054   CO2 31 (H) 10/02/2012 0905   BUN 17 12/26/2018 1054   BUN 18 11/16/2018 1219   BUN 13.9 10/02/2012 0905   CREATININE 0.77 12/26/2018 1054   CREATININE 0.77 12/08/2014 1153   CREATININE 0.8 10/02/2012 0905      Component Value Date/Time   CALCIUM  9.8 12/26/2018 1054   CALCIUM 9.9 10/02/2012 0905   ALKPHOS 98 11/16/2018 1219   AST 27 11/16/2018 1219   ALT 24 11/16/2018 1219   BILITOT 0.2 11/16/2018 1219      RADIOGRAPHIC STUDIES: No results found.  ASSESSMENT AND PLAN:  This is a very pleasant 68 years old African-American female with iron deficiency anemia plus/minus anemia of chronic disease status post Feraheme infusion. Her last Feraheme infusion was given on August 12, 2017.   The patient is currently on treatment with Integra plus and tolerating it fairly well. Repeat CBC today showed stable hemoglobin and hematocrit.  Iron study and ferritin are still pending. I recommended for the patient to continue her current treatment with Integra plus.  Depending on the iron studies today I may consider the patient for intravenous infusion of Feraheme if she has severe iron deficiency. I will see her back for follow-up visit in 6 months with repeat CBC, iron study and ferritin. The patient was advised to call immediately if she has any concerning symptoms in the interval. The patient voices understanding of current disease status and treatment options and is in agreement with the current care plan. All questions were answered. The patient knows to call the clinic with any problems, questions or concerns. We can certainly see the patient much sooner if necessary. I spent 10 minutes counseling the patient face to face. The total time spent in the appointment was 15 minutes.  Disclaimer: This note was dictated with voice recognition software. Similar sounding words can inadvertently be transcribed and may not be corrected upon review.

## 2019-01-01 NOTE — Telephone Encounter (Signed)
Scheduled per los. Gave avs and calendar  

## 2019-01-04 ENCOUNTER — Encounter: Payer: Self-pay | Admitting: Nurse Practitioner

## 2019-01-04 ENCOUNTER — Ambulatory Visit (INDEPENDENT_AMBULATORY_CARE_PROVIDER_SITE_OTHER): Payer: Medicare Other | Admitting: Nurse Practitioner

## 2019-01-04 ENCOUNTER — Other Ambulatory Visit: Payer: Self-pay

## 2019-01-04 VITALS — BP 112/70 | HR 74 | Temp 98.2°F | Ht 60.0 in | Wt 285.8 lb

## 2019-01-04 DIAGNOSIS — R410 Disorientation, unspecified: Secondary | ICD-10-CM | POA: Diagnosis not present

## 2019-01-04 LAB — POCT URINALYSIS DIPSTICK
Bilirubin, UA: NEGATIVE
Blood, UA: NEGATIVE
Glucose, UA: NEGATIVE
Leukocytes, UA: NEGATIVE
Nitrite, UA: NEGATIVE
Protein, UA: NEGATIVE
Spec Grav, UA: 1.025 (ref 1.010–1.025)
Urobilinogen, UA: 0.2 E.U./dL
pH, UA: 5.5 (ref 5.0–8.0)

## 2019-01-04 MED ORDER — FLUCONAZOLE 100 MG PO TABS: ORAL_TABLET | ORAL | 0 refills | Status: DC

## 2019-01-04 NOTE — Progress Notes (Signed)
Subjective:     Patient ID: Michele [name] , female    DOB: 1951-05-31 , 68 y.o.   MRN: [id number]   Chief Complaint  Patient presents with  . Urinary Tract Infection    patient is having some confusion, her urine has a odor to it and she complains of itching after urinating    HPI  Within the last 2 weeks she left the house at 3am, she had to be escorted back home by the police thinking she was going to a house they used to live in.  The patient is having nightmares screaming in her sleep.  The last two weeks have been worse.  Patient is having confusion,   Michele [name].  - she has   Urinary Tract Infection  This is a new problem. The current episode started 1 to 4 weeks ago. The quality of the pain is described as burning. She is not sexually active. There is no history of pyelonephritis. Pertinent negatives include no chills, discharge, frequency or nausea.     Past Medical History:  Diagnosis Date  . Allergic rhinitis   . ARF (acute renal failure) ([place]) 03/2017  . 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 Asthma   . CAD (coronary artery disease) 01/30/2007   stents trivial coronary artery disease diffusely, with a recent deployment od a intracoronary artery stent, 3.5 x 12 mm driver stent with no more than 20-30% in- stents restenosis.done by Dr [name] with re-look on novenber 10 2008 revealing a widely patent stent with otherwise trival CAD and normal LV function  . Carpal tunnel syndrome, right   . Chest pain 07/17/12009   2 D Echo EF >55%  . Chronic lower back pain   . Complication of anesthesia    slow to wake up  . COPD (chronic obstructive pulmonary disease) ([place])   . DDD (degenerative disc disease), lumbar    multilevel lumbar degenerative disc disease  . Depression   . Diabetes mellitus type II   . Gait instability   . GERD (gastroesophageal reflux disease)   . History of kidney stones   . History of stomach ulcers    "years ago"  (08/01/2018)  . Hyperlipidemia   . Hypertension   . IBS (irritable bowel syndrome)   . Iron deficiency anemia 07/13/2016  . Obesity   . On home oxygen therapy    "2L 24/7" (08/01/2018)  . OSA on CPAP    "w/O2" (08/01/2018)  . Paroxysmal A-fib ([place])   . PTSD (post-traumatic stress disorder)   . Stroke [facility])    "not sure when; showed up on one of my tests" (08/01/2018)  . TIA (transient ischemic attack)    "several; they say I have small ones" (08/01/2018)  . Urinary incontinence      Family History  Problem Relation Age of Onset  . Coronary artery disease Mother   . Coronary artery disease Father   . Depression Father      Current Outpatient Medications:  .  ACCU-CHEK GUIDE test strip, insert 1 by Subcutaneous route every day check blood sugar before breakfast and dinner, Disp: 300 each, Rfl: 2 .  ADVAIR DISKUS 250-50 MCG/DOSE AEPB, INHALE ONE PUFF TWICE DAILY, Disp: 60 each, Rfl: 5 .  albuterol (VENTOLIN HFA) 108 (90 Base) MCG/ACT inhaler, Inhale 2 puffs into the lungs every 6 (six) hours as needed for wheezing or shortness of breath., Disp: , Rfl:  .  amLODipine (NORVASC) 5 MG  tablet, Take 1 tablet (5 mg total) by mouth daily., Disp: 30 tablet, Rfl: 6 .  apixaban (ELIQUIS) 5 MG TABS tablet, Take 1 tablet (5 mg total) by mouth 2 (two) times daily., Disp: 60 tablet, Rfl: 5 .  ARIPiprazole (ABILIFY) 5 MG tablet, Take 1 tablet (5 mg total) by mouth daily., Disp: 90 tablet, Rfl: 0 .  aspirin EC 81 MG EC tablet, Take 1 tablet (81 mg total) by mouth daily., Disp: , Rfl:  .  benztropine (COGENTIN) 0.5 MG tablet, Take 1 tablet (0.5 mg total) by mouth at bedtime., Disp: 90 tablet, Rfl: 0 .  FeFum-FePoly-FA-B Cmp-C-Biot (INTEGRA PLUS) CAPS, Take 1 capsule by mouth once daily in the morning, Disp: 30 capsule, Rfl: 0 .  gabapentin (NEURONTIN) 300 MG capsule, Take 300 mg by mouth 3 (three) times daily as needed (for pain). , Disp: , Rfl: 0 .  hydrochlorothiazide (HYDRODIURIL) 25 MG tablet, TAKE  ONE-HALF TABLET (12.5 MG TOTAL) BY MOUTH DAILY FOR 30 DAYS., Disp: 30 tablet, Rfl: 6 .  ipratropium-albuterol (DUONEB) 0.5-2.5 (3) MG/3ML SOLN, USE 3 ML IN NEBULIZER EVERY 6 HOURS AS NEEDED DX: J45.20, Disp: 360 mL, Rfl: 5 .  isosorbide mononitrate (IMDUR) 30 MG 24 hr tablet, TAKE 1 TABLET (30 MG TOTAL) BY MOUTH DAILY., Disp: 90 tablet, Rfl: 0 .  MAGNESIUM GLUCONATE PO, Take by mouth., Disp: , Rfl:  .  metFORMIN (GLUCOPHAGE-XR) 500 MG 24 hr tablet, TAKE ONE (1) TABLET BY MOUTH EVERY DAY WITH BREAKFAST, Disp: 90 tablet, Rfl: 1 .  metoprolol tartrate (LOPRESSOR) 25 MG tablet, Take 1 tablet (25 mg total) by mouth 2 (two) times daily., Disp: 180 tablet, Rfl: 3 .  nitroGLYCERIN (NITROSTAT) 0.4 MG SL tablet, PLACE 1 TABLET UNDER THE TONGUE EVERY 5 MINUTES AS NEEDED FOR CHEST PAIN.  MAY REPEAT 3 TIMES.  IF NO RELIEF CALL DOCTOR, Disp: 25 tablet, Rfl: 11 .  OXYGEN, Inhale 2 L into the lungs as needed (for shortness of breath). , Disp: , Rfl:  .  pantoprazole (PROTONIX) 40 MG tablet, TAKE 1 TABLET (40 MG TOTAL) BY MOUTH DAILY. TAKE TWICE A DAY BEFORE MEALS FOR 8 WEEKS, THEN ONCE A DAY, Disp: 90 tablet, Rfl: 1 .  rosuvastatin (CRESTOR) 10 MG tablet, TAKE ONE (1) TABLET BY MOUTH EVERY DAY, Disp: 90 tablet, Rfl: 1 .  sertraline (ZOLOFT) 100 MG tablet, Take 2 tablets (200 mg total) by mouth daily., Disp: 180 tablet, Rfl: 0   Allergies  Allergen Reactions  . Meprobamate Nausea And Vomiting     Review of Systems  Constitutional: Negative for chills.  Gastrointestinal: Negative for nausea.  Genitourinary: Negative for frequency.     There were no vitals filed for this visit. There is no height or weight on file to calculate BMI.   Objective:  Physical Exam Vitals signs reviewed.  Constitutional:      General: She is not in acute distress.    Appearance: Normal appearance. She is obese.  Cardiovascular:     Rate and Rhythm: Normal rate and regular rhythm.     Pulses: Normal pulses.     Heart  sounds: Normal heart sounds. No murmur.  Pulmonary:     Effort: Pulmonary effort is normal. No respiratory distress.     Breath sounds: Normal breath sounds.  Skin:    General: Skin is warm and dry.     Capillary Refill: Capillary refill takes less than 2 seconds.  Neurological:     General: No focal deficit present.  Mental Status: She is alert and oriented to person, place, and time.     Cranial Nerves: No cranial nerve deficit.  Psychiatric:        Mood and Affect: Mood normal.        Behavior: Behavior normal.        Thought Content: Thought content normal.        Judgment: Judgment normal.         Assessment And Plan:     1. Episodic confusion  Urinalysis is normal however will send urine for culture due to the confusion  I have also discussed with the patient and sister to inform to be sure she is not left alone  Her sister informed me the neurologist Dr. Krista Blue reported the patient would have severe problems with her memory - POCT Urinalysis Dipstick (81002) - Culture, Urine    Minette Brine, FNP    THE PATIENT IS ENCOURAGED TO PRACTICE SOCIAL DISTANCING DUE TO THE COVID-19 PANDEMIC.

## 2019-01-05 ENCOUNTER — Other Ambulatory Visit: Payer: Self-pay | Admitting: Internal Medicine

## 2019-01-05 ENCOUNTER — Other Ambulatory Visit: Payer: Self-pay | Admitting: Nurse Practitioner

## 2019-01-05 NOTE — Telephone Encounter (Signed)
Pt seen by Dr. Gwenlyn Found during [facility] on 6/12. Office note included the following:   AF (paroxysmal atrial fibrillation) ([place]) History of paroxysmal atrial fibrillation on Eliquis oral anticoagulation.

## 2019-01-09 ENCOUNTER — Ambulatory Visit (HOSPITAL_COMMUNITY): Payer: Medicare Other | Admitting: Licensed Clinical Social Worker

## 2019-01-09 ENCOUNTER — Other Ambulatory Visit: Payer: Self-pay

## 2019-01-11 ENCOUNTER — Encounter: Payer: Self-pay | Admitting: Nurse Practitioner

## 2019-01-15 MED FILL — HYDROCHLOROTHIAZIDE 25 MG T: 25 | 60 days supply | Qty: 30 | Fill #0

## 2019-01-17 DIAGNOSIS — Z0120 Encounter for dental examination and cleaning without abnormal findings: Secondary | ICD-10-CM | POA: Diagnosis not present

## 2019-01-24 MED FILL — AMLODIPINE BESYLATE 5 MG TA: 5 | 30 days supply | Qty: 30 | Fill #2

## 2019-01-24 MED FILL — ELIQUIS 5 MG TABLET: 5 | 30 days supply | Qty: 60 | Fill #2

## 2019-01-31 ENCOUNTER — Other Ambulatory Visit: Payer: Self-pay | Admitting: Cardiovascular Disease

## 2019-01-31 ENCOUNTER — Other Ambulatory Visit: Payer: Self-pay | Admitting: Nurse Practitioner

## 2019-01-31 DIAGNOSIS — E782 Mixed hyperlipidemia: Secondary | ICD-10-CM

## 2019-02-14 ENCOUNTER — Other Ambulatory Visit: Payer: Self-pay

## 2019-02-15 ENCOUNTER — Ambulatory Visit (INDEPENDENT_AMBULATORY_CARE_PROVIDER_SITE_OTHER): Payer: Medicare Other | Admitting: Nurse Practitioner

## 2019-02-15 ENCOUNTER — Encounter: Payer: Self-pay | Admitting: Nurse Practitioner

## 2019-02-15 VITALS — BP 122/80 | HR 82 | Temp 98.1°F | Ht 61.4 in | Wt 283.0 lb

## 2019-02-15 DIAGNOSIS — E782 Mixed hyperlipidemia: Secondary | ICD-10-CM | POA: Diagnosis not present

## 2019-02-15 DIAGNOSIS — I10 Essential (primary) hypertension: Secondary | ICD-10-CM

## 2019-02-15 NOTE — Progress Notes (Signed)
Subjective:     Patient ID: Unk Lightning , female    DOB: 1950-12-30 , 68 y.o.   MRN: [id number]   Chief Complaint  Patient presents with  . Hypertension  . SHARP PAIN    patient stated she has been having sharp pains on and off    HPI  Wt Readings from Last 3 Encounters: 02/15/19 : 283 lb (128.4 kg) 01/04/19 : 285 lb 12.8 oz (129.6 kg) 01/01/19 : 292 lb 11.2 oz (132.8 kg)   Hypertension This is a chronic problem. The current episode started more than 1 year ago. The problem is controlled. Pertinent negatives include no anxiety, chest pain, headaches, malaise/fatigue, palpitations, peripheral edema or shortness of breath. There are no associated agents to hypertension. Risk factors for coronary artery disease include obesity and sedentary lifestyle. Past treatments include beta blockers. There are no compliance problems.  There is no history of angina. There is no history of chronic renal disease.     Past Medical History:  Diagnosis Date  . Allergic rhinitis   . ARF (acute renal failure) ([place]) 03/2017  . 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 Asthma   . CAD (coronary artery disease) 01/30/2007   stents trivial coronary artery disease diffusely, with a recent deployment od a intracoronary artery stent, 3.5 x 12 mm driver stent with no more than 20-30% in- stents restenosis.done by Dr [name] with re-look on novenber 10 2008 revealing a widely patent stent with otherwise trival CAD and normal LV function  . Carpal tunnel syndrome, right   . Chest pain 07/17/12009   2 D Echo EF >55%  . Chronic lower back pain   . Complication of anesthesia    slow to wake up  . COPD (chronic obstructive pulmonary disease) ([place])   . DDD (degenerative disc disease), lumbar    multilevel lumbar degenerative disc disease  . Depression   . Diabetes mellitus type II   . Gait instability   . GERD (gastroesophageal reflux disease)   . History of kidney stones    . History of stomach ulcers    "years ago" (08/01/2018)  . Hyperlipidemia   . Hypertension   . IBS (irritable bowel syndrome)   . Iron deficiency anemia 07/13/2016  . Obesity   . On home oxygen therapy    "2L 24/7" (08/01/2018)  . OSA on CPAP    "w/O2" (08/01/2018)  . Paroxysmal A-fib ([place])   . PTSD (post-traumatic stress disorder)   . Stroke [facility])    "not sure when; showed up on one of my tests" (08/01/2018)  . TIA (transient ischemic attack)    "several; they say I have small ones" (08/01/2018)  . Urinary incontinence      Family History  Problem Relation Age of Onset  . Coronary artery disease Mother   . Coronary artery disease Father   . Depression Father      Current Outpatient Medications:  .  ACCU-CHEK GUIDE test strip, insert 1 by Subcutaneous route every day check blood sugar before breakfast and dinner, Disp: 300 each, Rfl: 2 .  ADVAIR DISKUS 250-50 MCG/DOSE AEPB, INHALE ONE PUFF TWICE DAILY, Disp: 60 each, Rfl: 5 .  albuterol (VENTOLIN HFA) 108 (90 Base) MCG/ACT inhaler, Inhale 2 puffs into the lungs every 6 (six) hours as needed for wheezing or shortness of breath., Disp: , Rfl:  .  amLODipine (NORVASC) 5 MG tablet, Take 1 tablet (5 mg total) by mouth daily.,  Disp: 30 tablet, Rfl: 6 .  apixaban (ELIQUIS) 5 MG TABS tablet, Take 1 tablet (5 mg total) by mouth 2 (two) times daily., Disp: 60 tablet, Rfl: 5 .  ARIPiprazole (ABILIFY) 5 MG tablet, Take 1 tablet (5 mg total) by mouth daily., Disp: 90 tablet, Rfl: 0 .  aspirin EC 81 MG EC tablet, Take 1 tablet (81 mg total) by mouth daily., Disp: , Rfl:  .  benztropine (COGENTIN) 0.5 MG tablet, Take 1 tablet (0.5 mg total) by mouth at bedtime., Disp: 90 tablet, Rfl: 0 .  FeFum-FePoly-FA-B Cmp-C-Biot (INTEGRA PLUS) CAPS, Take 1 capsule by mouth once daily in the morning, Disp: 30 capsule, Rfl: 2 .  gabapentin (NEURONTIN) 300 MG capsule, Take 300 mg by mouth 3 (three) times daily as needed (for pain). , Disp: , Rfl: 0 .   hydrochlorothiazide (HYDRODIURIL) 25 MG tablet, TAKE ONE-HALF TABLET (12.5 MG TOTAL) BY MOUTH DAILY FOR 30 DAYS., Disp: 30 tablet, Rfl: 6 .  ipratropium-albuterol (DUONEB) 0.5-2.5 (3) MG/3ML SOLN, USE 3 ML IN NEBULIZER EVERY 6 HOURS AS NEEDED DX: J45.20, Disp: 360 mL, Rfl: 5 .  isosorbide mononitrate (IMDUR) 30 MG 24 hr tablet, TAKE 1 TABLET (30 MG TOTAL) BY MOUTH DAILY., Disp: 90 tablet, Rfl: 0 .  MAGNESIUM GLUCONATE PO, Take by mouth., Disp: , Rfl:  .  metFORMIN (GLUCOPHAGE-XR) 500 MG 24 hr tablet, Take 1 tablet by mouth once daily with breakfast, Disp: 90 tablet, Rfl: 0 .  metoprolol tartrate (LOPRESSOR) 25 MG tablet, Take 1 tablet (25 mg total) by mouth 2 (two) times daily., Disp: 180 tablet, Rfl: 3 .  nitroGLYCERIN (NITROSTAT) 0.4 MG SL tablet, PLACE 1 TABLET UNDER THE TONGUE EVERY 5 MINUTES AS NEEDED FOR CHEST PAIN.  MAY REPEAT 3 TIMES.  IF NO RELIEF CALL DOCTOR, Disp: 25 tablet, Rfl: 11 .  OXYGEN, Inhale 2 L into the lungs as needed (for shortness of breath). , Disp: , Rfl:  .  pantoprazole (PROTONIX) 40 MG tablet, TAKE 1 TABLET (40 MG TOTAL) BY MOUTH DAILY. TAKE TWICE A DAY BEFORE MEALS FOR 8 WEEKS, THEN ONCE A DAY, Disp: 90 tablet, Rfl: 1 .  rosuvastatin (CRESTOR) 10 MG tablet, TAKE ONE (1) TABLET BY MOUTH EVERY DAY, Disp: 90 tablet, Rfl: 0 .  sertraline (ZOLOFT) 100 MG tablet, Take 2 tablets (200 mg total) by mouth daily., Disp: 180 tablet, Rfl: 0   Allergies  Allergen Reactions  . Meprobamate Nausea And Vomiting     Review of Systems  Constitutional: Negative.  Negative for fatigue, fever and malaise/fatigue.  Eyes: Negative for visual disturbance.  Respiratory: Negative.  Negative for shortness of breath.   Cardiovascular: Negative.  Negative for chest pain, palpitations and leg swelling.  Gastrointestinal: Negative.   Endocrine: Negative.   Musculoskeletal: Negative.   Skin: Negative.   Neurological: Negative for dizziness, weakness and headaches.  Psychiatric/Behavioral:  Negative for confusion. The patient is not nervous/anxious.      Today's Vitals   02/15/19 1137  BP: 122/80  Pulse: 82  Temp: 98.1 F (36.7 C)  TempSrc: Oral  SpO2: 90%  Weight: 283 lb (128.4 kg)  Height: 5' 1.4" (1.56 m)  PainSc: 0-No pain   Body mass index is 52.78 kg/m.   Objective:  Physical Exam Vitals signs reviewed.  Constitutional:      Appearance: She is well-developed.  HENT:     Head: Normocephalic and atraumatic.  Eyes:     Pupils: Pupils are equal, round, and reactive to light.  Cardiovascular:  Rate and Rhythm: Normal rate and regular rhythm.     Pulses: Normal pulses.     Heart sounds: Normal heart sounds. No murmur.  Pulmonary:     Effort: Pulmonary effort is normal.     Breath sounds: Normal breath sounds.  Musculoskeletal: Normal range of motion.  Skin:    General: Skin is warm and dry.     Capillary Refill: Capillary refill takes less than 2 seconds.  Neurological:     General: No focal deficit present.     Mental Status: She is alert and oriented to person, place, and time.     Cranial Nerves: No cranial nerve deficit.  Psychiatric:        Mood and Affect: Mood normal.        Behavior: Behavior normal.        Thought Content: Thought content normal.        Judgment: Judgment normal.         Assessment And Plan:     1. Essential hypertension  Chronic  Great control of blood pressure.  Continue with current medications  2. Mixed hyperlipidemia  Chronic, controlled  Continue with current medications  3. Morbid obesity ([place])  Chronic, she has lost approximately 9 lbs since early June  I congratulated her on this  Her sister tells me her family is working with her to eat healthier  Continue with healthy diet and increasing physical activity   Minette Brine, FNP    THE PATIENT IS ENCOURAGED TO PRACTICE SOCIAL DISTANCING DUE TO THE COVID-19 PANDEMIC.

## 2019-02-25 ENCOUNTER — Other Ambulatory Visit (HOSPITAL_COMMUNITY): Payer: Self-pay | Admitting: Psychiatry

## 2019-02-25 DIAGNOSIS — F331 Major depressive disorder, recurrent, moderate: Secondary | ICD-10-CM

## 2019-02-27 NOTE — Assessment & Plan Note (Signed)
Non-compliant w CPAP. Tries it once in a while, no pattern and not enough to benefit. Discussed. Consider updating sleep study, but she admits she is likely not going to be compliant again.

## 2019-02-27 NOTE — Assessment & Plan Note (Signed)
Combination of CHF, obesity hypoventilation, asthma. Plan- continue O2 2L

## 2019-02-27 NOTE — Assessment & Plan Note (Signed)
Explained anemia aggravates low oxygen levels. Pressed her to f/u with GI and Hematology.  Labs today for CBC and BMET.

## 2019-03-02 MED FILL — AMLODIPINE BESYLATE 5 MG TA: 5 | 30 days supply | Qty: 30 | Fill #3

## 2019-03-02 MED FILL — ELIQUIS 5 MG TABLET: 5 | 30 days supply | Qty: 60 | Fill #3

## 2019-03-08 ENCOUNTER — Telehealth: Payer: Self-pay | Admitting: Internal Medicine

## 2019-03-08 NOTE — Telephone Encounter (Signed)
I spoke with the patient's daughter about rescheduling 05/28/2019 appt to allow same day AWV.  She asked me to call the patient's caregiver, Sheldon Silvan.  I called Ms. Alroy Dust, and she said that her daughter is now Ms. Wince's caregiver.  I left my number for her and asked her to have her daughter to call me back to reschedule appointment.

## 2019-03-12 ENCOUNTER — Encounter: Payer: Self-pay | Admitting: Primary Care

## 2019-03-12 ENCOUNTER — Ambulatory Visit: Payer: Medicare Other | Admitting: Primary Care

## 2019-03-12 ENCOUNTER — Telehealth: Payer: Self-pay | Admitting: Primary Care

## 2019-03-12 ENCOUNTER — Other Ambulatory Visit: Payer: Self-pay

## 2019-03-12 VITALS — BP 140/66 | HR 87 | Temp 97.7°F | Ht 61.0 in | Wt 291.2 lb

## 2019-03-12 DIAGNOSIS — R0600 Dyspnea, unspecified: Secondary | ICD-10-CM

## 2019-03-12 DIAGNOSIS — G4733 Obstructive sleep apnea (adult) (pediatric): Secondary | ICD-10-CM

## 2019-03-12 DIAGNOSIS — J449 Chronic obstructive pulmonary disease, unspecified: Secondary | ICD-10-CM | POA: Diagnosis not present

## 2019-03-12 DIAGNOSIS — I5032 Chronic diastolic (congestive) heart failure: Secondary | ICD-10-CM

## 2019-03-12 LAB — CBC
HCT: 35.7 % — ABNORMAL LOW (ref 36.0–46.0)
Hemoglobin: 11 g/dL — ABNORMAL LOW (ref 12.0–15.0)
MCHC: 30.7 g/dL (ref 30.0–36.0)
MCV: 69.6 fl — ABNORMAL LOW (ref 78.0–100.0)
Platelets: 293 10*3/uL (ref 150.0–400.0)
RBC: 5.13 Mil/uL — ABNORMAL HIGH (ref 3.87–5.11)
RDW: 17.5 % — ABNORMAL HIGH (ref 11.5–15.5)
WBC: 6.5 10*3/uL (ref 4.0–10.5)

## 2019-03-12 LAB — BASIC METABOLIC PANEL
BUN: 16 mg/dL (ref 6–23)
CO2: 35 mEq/L — ABNORMAL HIGH (ref 19–32)
Calcium: 10.1 mg/dL (ref 8.4–10.5)
Chloride: 96 mEq/L (ref 96–112)
Creatinine, Ser: 0.81 mg/dL (ref 0.40–1.20)
GFR: 85.07 mL/min (ref >60.00)
Glucose, Bld: 93 mg/dL (ref 70–99)
Potassium: 3.5 mEq/L (ref 3.5–5.1)
Sodium: 140 mEq/L (ref 135–145)

## 2019-03-12 LAB — BRAIN NATRIURETIC PEPTIDE: Pro B Natriuretic peptide (BNP): 58 pg/mL (ref 0.0–100.0)

## 2019-03-12 MED ORDER — PREDNISONE 10 MG PO TABS: ORAL_TABLET | ORAL | 0 refills | Status: DC

## 2019-03-12 MED ORDER — METHYLPREDNISOLONE ACETATE 80 MG/ML IJ SUSP
80.0000 mg | Freq: Once | INTRAMUSCULAR | Status: AC
  Administered 2019-03-12: 80 mg via INTRAMUSCULAR

## 2019-03-12 NOTE — Assessment & Plan Note (Signed)
-   Nocturnal shortness of breath - Checking BNP

## 2019-03-12 NOTE — Patient Instructions (Signed)
Orders: CBC, BNP, BMET  ( we will call your today with results)  Office treatment: Depo-medrol 70m IM x1  Asthma/COPD: Wear your CPAP with oxygen at night if short of breath Continue Advair twice daily Use nebulizer every 6 hours for shortness of breath/wheezing  Dark stools: Continue protonix twice daily Iron could be causing dark stool color Call your PCP or Gastroenterologist to notify them  If symptoms worsen present to ED  Follow-up: December with Dr. YAnnamaria Boots

## 2019-03-12 NOTE — Assessment & Plan Note (Addendum)
-   Encouraged patient to wear CPAP with oxygen at night  - Has FU in December with Dr. Annamaria Boots

## 2019-03-12 NOTE — Progress Notes (Signed)
@Patient  ID: Unk Michele [name], female    DOB: [birth year], 68 y.o.   MRN: [id number]  Chief Complaint  Patient presents with   Follow-up    had "3 spells of SOB' during the night - relieved with ventolin and O2    Referring provider: [name], MD  HPI: 68 year old female, never smoked. PMH significant for asthma/COPD overlap, OSA, obesity hypoventilation, CHF- grade 1 diastolic dysfunction, HTN, PFO, afib, GERD, diabetes type 2, GIB, depression, chronic pain syndrome, stroke. Patient of Dr. Annamaria Boots, last seen on 12/26/18. Continue on 2L nocturnal oxygen. Non-compliant with CPAP. Consider updating sleep study.   03/12/2019  Patient presents today for acute visit. She is a poor historian, care taker is in the car. Reports three episodes of shortness of breath at night and wheezing. She is not very compliance with CPAP but did wear it last night. Continue taking advair twice daily as directed. Using albuterol nebulizer with improvement. Nocturnal O2 at 2L.  Has a cough with yellow to clear mucus which is not new. Also reports nausea and vomiting this morning. Her last bowel movement was this morning, reports that she has had three episode of dark stool. Denies abdominal pain. She is taking Protonix twice daily. Followed by Dr. Earlie Server for iron deficiency anemia, on iron supplement. No change in her diet. Discussed with Dr. Annamaria Boots.   Allergies  Allergen Reactions   Meprobamate Nausea And Vomiting    Immunization History  Administered Date(s) Administered   Influenza Split 04/05/2011, 04/24/2012   Influenza Whole 04/13/2010   Influenza, High Dose Seasonal PF 04/22/2016, 04/25/2018   Influenza,inj,Quad PF,6+ Mos 06/01/2013, 05/02/2014, 04/17/2015   Influenza,inj,quad, With Preservative 05/19/2017   Influenza-Unspecified 04/30/2014   Pneumococcal Conjugate-13 11/05/2014, 03/24/2018   Pneumococcal Polysaccharide-23 07/19/2009   Tdap 07/19/2013   Zoster 10/18/2014      Past Medical History:  Diagnosis Date   Allergic rhinitis    ARF (acute renal failure) ([place]) 03/2017   Arthritis    degenerative in back, knee (08/01/2018)   Asthma    CAD (coronary artery disease) 01/30/2007   stents trivial coronary artery disease diffusely, with a recent deployment od a intracoronary artery stent, 3.5 x 12 mm driver stent with no more than 20-30% in- stents restenosis.done by Dr [name] with re-look on novenber 10 2008 revealing a widely patent stent with otherwise trival CAD and normal LV function   Carpal tunnel syndrome, right    Chest pain 07/17/12009   2 D Echo EF >55%   Chronic lower back pain    Complication of anesthesia    slow to wake up   COPD (chronic obstructive pulmonary disease) (HCC)    DDD (degenerative disc disease), lumbar    multilevel lumbar degenerative disc disease   Depression    Diabetes mellitus type II    Gait instability    GERD (gastroesophageal reflux disease)    History of kidney stones    History of stomach ulcers    "years ago" (08/01/2018)   Hyperlipidemia    Hypertension    IBS (irritable bowel syndrome)    Iron deficiency anemia 07/13/2016   Obesity    On home oxygen therapy    "2L 24/7" (08/01/2018)   OSA on CPAP    "w/O2" (08/01/2018)   Paroxysmal A-fib (HCC)    PTSD (post-traumatic stress disorder)    Stroke ([place])    "not sure when; showed up on one of my tests" (08/01/2018)   TIA (transient ischemic  attack)    "several; they say I have small ones" (08/01/2018)   Urinary incontinence     Tobacco History: Social History   Tobacco Use  Smoking Status Never Smoker  Smokeless Tobacco Never Used   Counseling given: Not Answered   Outpatient Medications Prior to Visit  Medication Sig Dispense Refill   ACCU-CHEK GUIDE test strip insert 1 by Subcutaneous route every day check blood sugar before breakfast and dinner 300 each 2   ADVAIR DISKUS 250-50 MCG/DOSE AEPB INHALE ONE  PUFF TWICE DAILY 60 each 5   albuterol (VENTOLIN HFA) 108 (90 Base) MCG/ACT inhaler Inhale 2 puffs into the lungs every 6 (six) hours as needed for wheezing or shortness of breath.     amLODipine (NORVASC) 5 MG tablet Take 1 tablet (5 mg total) by mouth daily. 30 tablet 6   apixaban (ELIQUIS) 5 MG TABS tablet Take 1 tablet (5 mg total) by mouth 2 (two) times daily. 60 tablet 5   ARIPiprazole (ABILIFY) 5 MG tablet Take 1 tablet (5 mg total) by mouth daily. 90 tablet 0   aspirin EC 81 MG EC tablet Take 1 tablet (81 mg total) by mouth daily.     benztropine (COGENTIN) 0.5 MG tablet Take 1 tablet (0.5 mg total) by mouth at bedtime. 90 tablet 0   FeFum-FePoly-FA-B Cmp-C-Biot (INTEGRA PLUS) CAPS Take 1 capsule by mouth once daily in the morning 30 capsule 2   gabapentin (NEURONTIN) 300 MG capsule Take 300 mg by mouth 3 (three) times daily as needed (for pain).   0   hydrochlorothiazide (HYDRODIURIL) 25 MG tablet TAKE ONE-HALF TABLET (12.5 MG TOTAL) BY MOUTH DAILY FOR 30 DAYS. 30 tablet 6   ipratropium-albuterol (DUONEB) 0.5-2.5 (3) MG/3ML SOLN USE 3 ML IN NEBULIZER EVERY 6 HOURS AS NEEDED DX: J45.20 360 mL 5   isosorbide mononitrate (IMDUR) 30 MG 24 hr tablet TAKE 1 TABLET (30 MG TOTAL) BY MOUTH DAILY. 90 tablet 0   MAGNESIUM GLUCONATE PO Take by mouth.     metFORMIN (GLUCOPHAGE-XR) 500 MG 24 hr tablet Take 1 tablet by mouth once daily with breakfast 90 tablet 0   metoprolol tartrate (LOPRESSOR) 25 MG tablet Take 1 tablet (25 mg total) by mouth 2 (two) times daily. 180 tablet 3   nitroGLYCERIN (NITROSTAT) 0.4 MG SL tablet PLACE 1 TABLET UNDER THE TONGUE EVERY 5 MINUTES AS NEEDED FOR CHEST PAIN.  MAY REPEAT 3 TIMES.  IF NO RELIEF CALL DOCTOR 25 tablet 11   OXYGEN Inhale 2 L into the lungs as needed (for shortness of breath).      pantoprazole (PROTONIX) 40 MG tablet TAKE 1 TABLET (40 MG TOTAL) BY MOUTH DAILY. TAKE TWICE A DAY BEFORE MEALS FOR 8 WEEKS, THEN ONCE A DAY 90 tablet 1    rosuvastatin (CRESTOR) 10 MG tablet TAKE ONE (1) TABLET BY MOUTH EVERY DAY 90 tablet 0   sertraline (ZOLOFT) 100 MG tablet Take 2 tablets (200 mg total) by mouth daily. 180 tablet 0   No facility-administered medications prior to visit.    Review of Systems  Review of Systems  Constitutional: Negative.   Respiratory: Positive for shortness of breath and wheezing.   Cardiovascular: Negative.   Gastrointestinal: Positive for nausea and vomiting.   Physical Exam  BP 140/66 (BP Location: Right Arm, Patient Position: Sitting, Cuff Size: Large)    Pulse 87    Temp 97.7 F (36.5 C)    Ht 5' 1"  (1.549 m)    Wt 291 lb  3.2 oz (132.1 kg)    SpO2 95%    BMI 55.02 kg/m  Physical Exam Constitutional:      Appearance: Normal appearance. She is obese.  HENT:     Head: Normocephalic and atraumatic.  Neck:     Musculoskeletal: Normal range of motion and neck supple.  Cardiovascular:     Rate and Rhythm: Normal rate and regular rhythm.  Pulmonary:     Effort: Pulmonary effort is normal. No respiratory distress.     Breath sounds: No stridor. Wheezing present. No rhonchi.  Musculoskeletal: Normal range of motion.     Comments: In WC  Skin:    General: Skin is warm and dry.  Neurological:     General: No focal deficit present.     Mental Status: She is alert and oriented to person, place, and time. Mental status is at baseline.  Psychiatric:        Mood and Affect: Mood normal.        Behavior: Behavior normal.        Thought Content: Thought content normal.        Judgment: Judgment normal.      Lab Results:  CBC    Component Value Date/Time   WBC 6.6 01/01/2019 1011   WBC 6.7 12/26/2018 1054   RBC 5.21 (H) 01/01/2019 1011   HGB 11.0 (L) 01/01/2019 1011   HGB 9.9 (L) 05/27/2017 1222   HCT 38.3 01/01/2019 1011   HCT 31.7 (L) 05/27/2017 1222   PLT 306 01/01/2019 1011   PLT 310 05/27/2017 1222   MCV 73.5 (L) 01/01/2019 1011   MCV 66.4 (L) 05/27/2017 1222   MCH 21.1 (L)  01/01/2019 1011   MCHC 28.7 (L) 01/01/2019 1011   RDW 17.7 (H) 01/01/2019 1011   RDW 19.9 (H) 05/27/2017 1222   LYMPHSABS 1.4 01/01/2019 1011   LYMPHSABS 2.7 05/27/2017 1222   MONOABS 0.5 01/01/2019 1011   MONOABS 0.6 05/27/2017 1222   EOSABS 0.4 01/01/2019 1011   EOSABS 0.3 05/27/2017 1222   BASOSABS 0.1 01/01/2019 1011   BASOSABS 0.1 05/27/2017 1222    BMET    Component Value Date/Time   NA 144 12/26/2018 1054   NA 144 11/16/2018 1219   NA 144 10/02/2012 0905   K 3.6 12/26/2018 1054   K 3.4 (L) 10/02/2012 0905   CL 99 12/26/2018 1054   CL 101 10/02/2012 0905   CO2 37 (H) 12/26/2018 1054   CO2 31 (H) 10/02/2012 0905   GLUCOSE 89 12/26/2018 1054   GLUCOSE 107 (H) 10/02/2012 0905   BUN 17 12/26/2018 1054   BUN 18 11/16/2018 1219   BUN 13.9 10/02/2012 0905   CREATININE 0.77 12/26/2018 1054   CREATININE 0.77 12/08/2014 1153   CREATININE 0.8 10/02/2012 0905   CALCIUM 9.8 12/26/2018 1054   CALCIUM 9.9 10/02/2012 0905   GFRNONAA 78 11/16/2018 1219   GFRNONAA >89 01/08/2014 1204   GFRAA 90 11/16/2018 1219   GFRAA >89 01/08/2014 1204    BNP    Component Value Date/Time   BNP 16.7 09/24/2017 2223    ProBNP    Component Value Date/Time   PROBNP 197.0 (H) 03/16/2016 1057    Imaging: No results found.   Assessment & Plan:   Asthma, mild intermittent - Mild exacerbation - Received depomedrol 8m IM x 1 today - Continue Advair twice daily - Use duoneb nebulizer every 6 hours for shortness of breath/wheezing   Obstructive sleep apnea - Encouraged patient to wear CPAP  with oxygen at night  - Has FU in December with Dr. Annamaria Boots    Chronic diastolic CHF (congestive heart failure) (HCC) - Nocturnal shortness of breath - Checking BNP   Anemia, iron deficiency - Reports dark stools - Takes daily iron supplement  - Checking CBC - Follows with Dr. Julien Nordmann for iron deficiency anemia   Martyn Ehrich, NP 03/12/2019

## 2019-03-12 NOTE — Assessment & Plan Note (Signed)
-   Reports dark stools - Takes daily iron supplement  - Checking CBC - Follows with Dr. Julien Nordmann for iron deficiency anemia

## 2019-03-12 NOTE — Telephone Encounter (Signed)
Review lab results with daughter on file. HGB 11, which is stable for her. She will follow up with PCP or GI regarding dark stools, may want to test for occult blood. Also sending in prednisone taper for asthma exacerbation which she will start tomorrow. Reinforced importance of compliance with cpap.

## 2019-03-12 NOTE — Assessment & Plan Note (Signed)
-   Mild exacerbation - Received depomedrol 80mg  IM x 1 today - Continue Advair twice daily - Use duoneb nebulizer every 6 hours for shortness of breath/wheezing

## 2019-03-28 MED FILL — AMLODIPINE BESYLATE 5 MG TA: 5 | 30 days supply | Qty: 30 | Fill #4

## 2019-04-01 ENCOUNTER — Other Ambulatory Visit (HOSPITAL_COMMUNITY): Payer: Self-pay | Admitting: Psychiatry

## 2019-04-01 DIAGNOSIS — F4310 Post-traumatic stress disorder, unspecified: Secondary | ICD-10-CM

## 2019-04-01 DIAGNOSIS — F331 Major depressive disorder, recurrent, moderate: Secondary | ICD-10-CM

## 2019-04-02 MED FILL — GABAPENTIN 300 MG CAPSULE: 300 | 30 days supply | Qty: 90 | Fill #0

## 2019-04-02 MED FILL — HYDROCHLOROTHIAZIDE 25 MG T: 25 | 60 days supply | Qty: 30 | Fill #1

## 2019-04-02 MED FILL — ELIQUIS 5 MG TABLET: 5 | 30 days supply | Qty: 60 | Fill #4

## 2019-04-18 ENCOUNTER — Other Ambulatory Visit: Payer: Self-pay | Admitting: Internal Medicine

## 2019-04-18 ENCOUNTER — Other Ambulatory Visit: Payer: Self-pay | Admitting: Cardiology

## 2019-04-18 ENCOUNTER — Other Ambulatory Visit: Payer: Self-pay | Admitting: Cardiovascular Disease

## 2019-04-18 DIAGNOSIS — D509 Iron deficiency anemia, unspecified: Secondary | ICD-10-CM

## 2019-04-18 MED FILL — ISOSORBIDE MN ER 30 MG TAB: 30 | 90 days supply | Qty: 90 | Fill #0

## 2019-04-18 NOTE — Telephone Encounter (Signed)
Next scheduled F/U: 06-28-2019.

## 2019-04-20 MED FILL — AMLODIPINE BESYLATE 5 MG TA: 5 | 90 days supply | Qty: 90 | Fill #0

## 2019-04-24 ENCOUNTER — Other Ambulatory Visit (HOSPITAL_COMMUNITY): Payer: Self-pay

## 2019-04-24 DIAGNOSIS — F331 Major depressive disorder, recurrent, moderate: Secondary | ICD-10-CM

## 2019-04-24 DIAGNOSIS — F4310 Post-traumatic stress disorder, unspecified: Secondary | ICD-10-CM

## 2019-04-24 MED ORDER — SERTRALINE HCL 100 MG PO TABS: 200.0000 mg | ORAL_TABLET | Freq: Every day | ORAL | 0 refills | Status: AC

## 2019-04-26 ENCOUNTER — Other Ambulatory Visit: Payer: Self-pay | Admitting: Nurse Practitioner

## 2019-04-26 ENCOUNTER — Other Ambulatory Visit: Payer: Self-pay

## 2019-04-26 ENCOUNTER — Telehealth (INDEPENDENT_AMBULATORY_CARE_PROVIDER_SITE_OTHER): Payer: Medicare Other | Admitting: Nurse Practitioner

## 2019-04-26 ENCOUNTER — Encounter: Payer: Self-pay | Admitting: Nurse Practitioner

## 2019-04-26 DIAGNOSIS — J441 Chronic obstructive pulmonary disease with (acute) exacerbation: Secondary | ICD-10-CM

## 2019-04-26 DIAGNOSIS — J302 Other seasonal allergic rhinitis: Secondary | ICD-10-CM

## 2019-04-26 DIAGNOSIS — E782 Mixed hyperlipidemia: Secondary | ICD-10-CM

## 2019-04-26 MED ORDER — FEXOFENADINE HCL 180 MG PO TABS: 180.0000 mg | ORAL_TABLET | Freq: Every day | ORAL | 0 refills | Status: AC

## 2019-04-26 MED ORDER — PREDNISONE 20 MG PO TABS: ORAL_TABLET | ORAL | 0 refills | Status: AC

## 2019-04-26 NOTE — Progress Notes (Signed)
Virtual Visit via Virtual   This visit type was conducted due to national recommendations for restrictions regarding the COVID-19 Pandemic (e.g. social distancing) in an effort to limit this patient's exposure and mitigate transmission in our community.  Due to her co-morbid illnesses, this patient is at least at moderate risk for complications without adequate follow up.  This format is felt to be most appropriate for this patient at this time.  All issues noted in this document were discussed and addressed.  A limited physical exam was performed with this format.    This visit type was conducted due to national recommendations for restrictions regarding the COVID-19 Pandemic (e.g. social distancing) in an effort to limit this patient's exposure and mitigate transmission in our community.  Patients identity confirmed using two different identifiers.  This format is felt to be most appropriate for this patient at this time.  All issues noted in this document were discussed and addressed.  No physical exam was performed (except for noted visual exam findings with Video Visits).    Date:  05/17/2019   ID:  Unk Lightning, DOB Jan 15, 1951, MRN [id number]  Patient Location:  Home - spoke with Michele [name]  Provider location:   Office    Chief Complaint:  Intermittent shortness of breath  History of Present Illness:    Michele [name] is a 68 y.o. female who presents via video conferencing for a telehealth visit today.    The patient does not have symptoms concerning for COVID-19 infection (fever, chills, cough, or new shortness of breath).   Shortness of Breath This is a chronic problem. The current episode started more than 1 year ago. The problem occurs intermittently. The problem has been gradually worsening. Associated symptoms include wheezing. Pertinent negatives include no abdominal pain. She has tried ipratropium inhalers for the symptoms. Her past  medical history is significant for COPD. There is no history of allergies.     Past Medical History:  Diagnosis Date  . Allergic rhinitis   . ARF (acute renal failure) ([place] [facility]) 03/2017  . 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 Asthma   . CAD (coronary artery disease) 01/30/2007   stents trivial coronary artery disease diffusely, with a recent deployment od a intracoronary artery stent, 3.5 x 12 mm driver stent with no more than 20-30% in- stents restenosis.done by Dr [name] with re-look on novenber 10 2008 revealing a widely patent stent with otherwise trival CAD and normal LV function  . Carpal tunnel syndrome, right   . Chest pain 07/17/12009   2 D Echo EF >55%  . Chronic lower back pain   . Complication of anesthesia    slow to wake up  . COPD (chronic obstructive pulmonary disease) ([place])   . DDD (degenerative disc disease), lumbar    multilevel lumbar degenerative disc disease  . Depression   . Diabetes mellitus type II   . Gait instability   . GERD (gastroesophageal reflux disease)   . History of kidney stones   . History of stomach ulcers    "years ago" (08/01/2018)  . Hyperlipidemia   . Hypertension   . IBS (irritable bowel syndrome)   . Iron deficiency anemia 07/13/2016  . Obesity   . On home oxygen therapy    "2L 24/7" (08/01/2018)  . OSA on CPAP    "w/O2" (08/01/2018)  . Paroxysmal A-fib ([place])   . PTSD (post-traumatic stress disorder)   . Stroke [facility])    "  not sure when; showed up on one of my tests" (08/01/2018)  . TIA (transient ischemic attack)    "several; they say I have small ones" (08/01/2018)  . Urinary incontinence    Past Surgical History:  Procedure Laterality Date  . CARDIAC CATHETERIZATION    . CHOLECYSTECTOMY    . COLONOSCOPY WITH PROPOFOL N/A 11/18/2017   Procedure: COLONOSCOPY WITH PROPOFOL;  Surgeon: Carol Ada, MD;  Location: [facility];  Service: Endoscopy;  Laterality: N/A;  . CORONARY ANGIOPLASTY WITH STENT PLACEMENT     . CYST EXCISION Left    groin  . CYSTOSCOPY W/ STONE MANIPULATION    . ESOPHAGOGASTRODUODENOSCOPY N/A 09/25/2017   Procedure: ESOPHAGOGASTRODUODENOSCOPY (EGD);  Surgeon: Clarene Essex, MD;  Location: [facility];  Service: Endoscopy;  Laterality: N/A;  . EXCISIONAL HEMORRHOIDECTOMY    . LEFT HEART CATH AND CORONARY ANGIOGRAPHY N/A 08/09/2018   Procedure: LEFT HEART CATH AND CORONARY ANGIOGRAPHY;  Surgeon: Belva Crome, MD;  Location: [place] CV LAB;  Service: Cardiovascular;  Laterality: N/A;  . stress myocardial perfusion study  09/08/2010   normal; EF 75%     Current Meds  Medication Sig  . ACCU-CHEK GUIDE test strip insert 1 by Subcutaneous route every day check blood sugar before breakfast and dinner  . ADVAIR DISKUS 250-50 MCG/DOSE AEPB INHALE ONE PUFF TWICE DAILY     Allergies:   Meprobamate   Social History   Tobacco Use  . Smoking status: Never Smoker  . Smokeless tobacco: Never Used  Substance Use Topics  . Alcohol use: No    Alcohol/week: 0.0 standard drinks  . Drug use: No     Family Hx: The patient's family history includes Coronary artery disease in her father and mother; Depression in her father.  ROS:   Please see the history of present illness.    Review of Systems  Constitutional: Negative.   Respiratory: Positive for shortness of breath and wheezing.   Cardiovascular: Negative.   Gastrointestinal: Negative for abdominal pain.  Neurological:       Recall is poor this is chronic  Psychiatric/Behavioral: Negative.     All other systems reviewed and are negative.   Labs/Other Tests and Data Reviewed:    Recent Labs: 08/09/2018: Magnesium 2.1 11/16/2018: ALT 24 03/12/2019: BUN 16; Creatinine, Ser 0.81; Hemoglobin 11.0; Platelets 293.0; Potassium 3.5; Pro B Natriuretic peptide (BNP) 58.0; Sodium 140   Recent Lipid Panel Lab Results  Component Value Date/Time   CHOL 103 09/25/2017 10:07 AM   TRIG 160 (H) 09/25/2017 10:07 AM   HDL 31 (L)  09/25/2017 10:07 AM   CHOLHDL 3.3 09/25/2017 10:07 AM   LDLCALC 40 09/25/2017 10:07 AM    Wt Readings from Last 3 Encounters:  03/12/19 291 lb 3.2 oz (132.1 kg)  02/15/19 283 lb (128.4 kg)  01/04/19 285 lb 12.8 oz (129.6 kg)     Exam:    Vital Signs:  There were no vitals taken for this visit.    Physical Exam  Constitutional: She is well-developed, well-nourished, and in no distress. No distress (at times she is winded when speaking).  Pulmonary/Chest: She has wheezes (unable to hear due to virtual visit).  Short periods of being winded during conversation  Psychiatric: Mood, affect and judgment normal.  Mild cognitive impairment her sister is assisting with the visit    ASSESSMENT & PLAN:    1. COPD exacerbation ([place])  Using her albuterol neb/inhaler at least 3 times per day  Will treat with prednisone burst  If she is not better she is advised to follow up with Dr Annamaria Boots Pulmonology - fexofenadine (ALLEGRA) 180 MG tablet; Take 1 tablet (180 mg total) by mouth daily.  Dispense: 90 tablet; Refill: 0 - predniSONE (DELTASONE) 20 MG tablet; Take 2 tablets by mouth once a day  Dispense: 10 tablet; Refill: 0  2. Seasonal allergies  Chronic, needs refill - fexofenadine (ALLEGRA) 180 MG tablet; Take 1 tablet (180 mg total) by mouth daily.  Dispense: 90 tablet; Refill: 0    COVID-19 Education: The signs and symptoms of COVID-19 were discussed with the patient and how to seek care for testing (follow up with PCP or arrange E-visit).  The importance of social distancing was discussed today.  Patient Risk:   After full review of this patients clinical status, I feel that they are at least moderate risk at this time.  Time:   Today, I have spent 9 minutes/ seconds with the patient with telehealth technology discussing above diagnoses.     Medication Adjustments/Labs and Tests Ordered: Current medicines are reviewed at length with the patient today.  Concerns regarding  medicines are outlined above.   Tests Ordered: No orders of the defined types were placed in this encounter.   Medication Changes: Meds ordered this encounter  Medications  . fexofenadine (ALLEGRA) 180 MG tablet    Sig: Take 1 tablet (180 mg total) by mouth daily.    Dispense:  90 tablet    Refill:  0  . predniSONE (DELTASONE) 20 MG tablet    Sig: Take 2 tablets by mouth once a day    Dispense:  10 tablet    Refill:  0    Disposition:  Follow up prn  Signed, Minette Brine, FNP

## 2019-05-10 ENCOUNTER — Ambulatory Visit (INDEPENDENT_AMBULATORY_CARE_PROVIDER_SITE_OTHER): Payer: Medicare Other | Admitting: Internal Medicine

## 2019-05-10 ENCOUNTER — Encounter: Payer: Self-pay | Admitting: Internal Medicine

## 2019-05-10 DIAGNOSIS — J441 Chronic obstructive pulmonary disease with (acute) exacerbation: Secondary | ICD-10-CM

## 2019-05-10 DIAGNOSIS — J9621 Acute and chronic respiratory failure with hypoxia: Secondary | ICD-10-CM | POA: Diagnosis not present

## 2019-05-10 DIAGNOSIS — I5032 Chronic diastolic (congestive) heart failure: Secondary | ICD-10-CM | POA: Diagnosis not present

## 2019-05-10 MED ORDER — ALBUTEROL SULFATE HFA 108 (90 BASE) MCG/ACT IN AERS: 2.0000 | INHALATION_SPRAY | Freq: Four times a day (QID) | RESPIRATORY_TRACT | 12 refills | Status: AC | PRN

## 2019-05-10 NOTE — Progress Notes (Signed)
Patient ID: Michele [name], female    DOB: [birth year], 68 y.o.   MRN: [id number]  HPI F never smoker. Followed here for obstructive sleep apnea, allergic rhinitis, Asthma/COPD complicated by obesity/hypotension, GERD and CAD.  NPSG 07/16/16-AHI 56.9/hour, oxygen desaturation 80%, body weight 297 pounds Office Spirometry 04/25/2018-very severe obstructive airways disease-FVC 0.7/30%, FEV1 0.4/23%, ratio 0.60/77% Walk test 08/23/2018-room air-3 slow laps using a cane "legs weak".  Some dyspnea and wheeze by end of walk.  Lowest saturation 90%, maximum heart rate 114. ----------------------------------------------------------------------------------  12/26/2018- 68 year old female never smoker followed for OSA/failed CPAP, allergic rhinitis, Asthma/COPD, complicated by obesity/hypoventilation, GERD, CAD/stents/Eliquis AFib, CVA, chronic anemia, depression, DM 2, HBP,      O2 2 L  [employer] CPAP auto 5-15/[employer]. Still has, but rarely tries to use it -----C/O increased SOB and cough that comes and goes. Pt thinks may be due to weather changes. On O2 @ 2L prn is needing it at least 1xqd. Using rescue inhaler about 2xqd. Body weight today 295 lbs Says she is using CPAP off and on. No download. Averaging 3 "episodes"/ week  Of wheezing dyspnea. Nebulizer helsp. C/o abd cramps. Having black stools on iron. Colonoscopy Dr Collene Mares 6 months ago. Follows w Dr Julien Nordmann for anemia.  CXR 1V 08/01/2018- Negative chest.  05/10/2019- Virtual Visit via Telephone Note  I connected with [place] on 05/10/19 at  1:30 PM EDT by telephone and verified that I am speaking with the correct person using two identifiers.  Location: Patient: H Provider:O   I discussed the limitations, risks, security and privacy concerns of performing an evaluation and management service by telephone and the availability of in person appointments. I also discussed with the patient that there may be a patient  responsible charge related to this service. The patient expressed understanding and agreed to proceed.   History of Present Illness: 68 year old female never smoker followed for OSA/failed CPAP, allergic rhinitis, Asthma/COPD, complicated by obesity/hypoventilation, GERD, CAD/stents/Eliquis AFib, CVA, chronic anemia, depression, DM 2, HBP,      O2 2 L [employer]     Nebulizer/ [employer] Prednisone from PCP, neb Duoneb, ventolin hfa, Advair 250,  Given depo 80 at [facility] for asthma exacerbation 8/24 with NP. Seasonal exacerbation, using neb 3-4x/ night, much dry coughing. Not aware of reflux and denies infection.   Observations/Objective:   Assessment and Plan: Asthma/ COPD- seasonal exacerb- med talk, refills. Labs for IgE and Eos when off prednisone. Consider Biologic. CAD- followed by cardiology. Denies acute symptoms Follow Up Instructions: 2 months   I discussed the assessment and treatment plan with the patient. The patient was provided an opportunity to ask questions and all were answered. The patient agreed with the plan and demonstrated an understanding of the instructions.   The patient was advised to call back or seek an in-person evaluation if the symptoms worsen or if the condition fails to improve as anticipated.  I provided 18 minutes of non-face-to-face time during this encounter.   Baird Lyons, MD    ROS-see HPI + = positive Constitutional:   No-   weight loss, night sweats, fevers, chills, +fatigue, lassitude. HEENT:   No-  headaches, difficulty swallowing, tooth/dental problems, sore throat,       No-sneezing, itching, ear ache, nasal congestion, post nasal drip,  CV:  No-   chest pain, orthopnea, PND, swelling in lower extremities, anasarca, dizziness, palpitations Resp: +shortness of breath with exertion or at rest.  productive cough,  No non-productive cough,  No- coughing up of blood.              No-   change in color of mucus. No- wheezing.   Skin: No-    rash or lesions. GI:  No-   heartburn, indigestion, abdominal pain, nausea, vomiting,  GU:  MS:  No-   joint pain or swelling.   Neuro-     nothing unusual Psych:  No- change in mood or affect. No acute  depression or anxiety.  No memory loss.  Objective:  General- Alert, Oriented, Affect-appropriate, Distress- none acute; + morbidly obese Skin- . Lymphadenopathy- none Head- atraumatic            Eyes- Gross vision intact, PERRLA, conjunctivae clear secretions            Ears- Hearing, canals-normal            Nose- +clear, no- Septal dev,  polyps, erosion, perforation             Throat- Mallampati III-IV , mucosa clear , drainage- none, tonsils- atrophic; raspy voice Neck- flexible , trachea midline, no stridor , thyroid nl, carotid no bruit Chest - symmetrical excursion , unlabored           Heart/CV- RRR- occ extra beat , no murmur , no gallop  , no rub, nl s1 s2                           - JVD- none , edema- none, stasis changes- none, varices- none           Lung- Wheeze +, , shallow, wheeze- none, cough- none , dullness-none, rub- none           Chest wall-  Abd-  Quiet, nontender Br/ Gen/ Rectal- Not done, not indicated Extrem- cyanosis- none, clubbing, none, atrophy- none, strength- nl; +walks with cane Neuro- grossly intact to observation  Assessment & Plan:

## 2019-05-10 NOTE — Patient Instructions (Signed)
Ok to continue current meds.  If you are not better in a week, please call and let us know.  When you are better and can stay off prednisone about a week,  I will order some lab tests-    CBC with diff and IgE level. These will help [place] tell if you might be a candidate for a different kind of injection therapy for asthma.

## 2019-05-12 NOTE — Assessment & Plan Note (Signed)
Asthma/ COPD overlap. Seasonal exacerbation. When off current prednisone from PCP we will check IgE and Eos to consider a Biologic.

## 2019-05-12 NOTE — Assessment & Plan Note (Signed)
Followed by cardiology. She has trouble distinguishing cardiac from pulmonary symptoms.

## 2019-05-12 NOTE — Assessment & Plan Note (Signed)
Continues to depend on oxygen for sleep and exertion

## 2019-05-16 MED FILL — ELIQUIS 5 MG TABLET: 5 | 30 days supply | Qty: 60 | Fill #5

## 2019-05-18 ENCOUNTER — Telehealth: Payer: Self-pay | Admitting: Internal Medicine

## 2019-05-18 ENCOUNTER — Telehealth: Payer: Self-pay

## 2019-05-20 NOTE — Telephone Encounter (Signed)
Call returned to [place], she reports the patient passed away this morning. She just wanted to be sure [facility] was notified. Condolences given.   Will route to [facility] as FYI.

## 2019-05-20 NOTE — Telephone Encounter (Signed)
TC from Pt's daughter stating that she has passed away today.Condolences given on behalf of [place].

## 2019-05-20 DEATH — deceased

## 2019-05-21 ENCOUNTER — Telehealth: Payer: Self-pay | Admitting: Nurse Practitioner

## 2019-05-21 NOTE — Telephone Encounter (Signed)
Spoke with daughter on October 31st expressed condolences for the loss of her mother.  Office on call received call from EMS making aware patient was found deceased by her daughter on May 23, 2023.

## 2019-05-28 ENCOUNTER — Ambulatory Visit: Payer: Medicare Other | Admitting: Nurse Practitioner

## 2019-05-30 ENCOUNTER — Ambulatory Visit: Payer: Medicare Other | Admitting: Nurse Practitioner

## 2019-05-30 ENCOUNTER — Ambulatory Visit: Payer: Medicare Other

## 2019-06-11 ENCOUNTER — Encounter (HOSPITAL_COMMUNITY): Payer: Medicare Other | Admitting: Psychiatry

## 2019-06-11 ENCOUNTER — Other Ambulatory Visit: Payer: Self-pay

## 2019-06-11 NOTE — Progress Notes (Signed)
I called and spoke to her daughter Starling Manns. She told patient deceased on May 22, 2023. I expressed my Condolences.

## 2019-06-28 ENCOUNTER — Other Ambulatory Visit: Payer: Medicare Other

## 2019-06-28 ENCOUNTER — Ambulatory Visit: Payer: Medicare Other | Admitting: Internal Medicine

## 2019-07-16 ENCOUNTER — Ambulatory Visit: Payer: Medicare Other | Admitting: Internal Medicine

## 2020-06-12 IMAGING — MG DIGITAL SCREENING BILATERAL MAMMOGRAM WITH TOMO AND CAD
8 of 14 series · 8 of 40 positions shown · non-contrast
Comparison: Previous exam(s).

CLINICAL DATA: Screening.

EXAM:
DIGITAL SCREENING BILATERAL MAMMOGRAM WITH TOMO AND CAD

[L MLO synth-2D (1 of 2)]
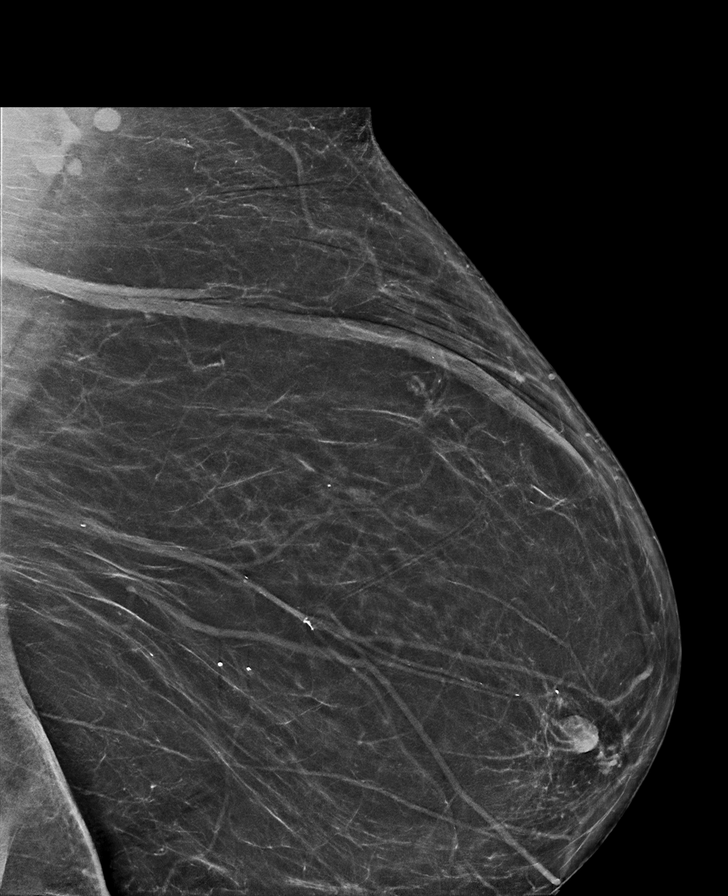

[L CC synth-2D]
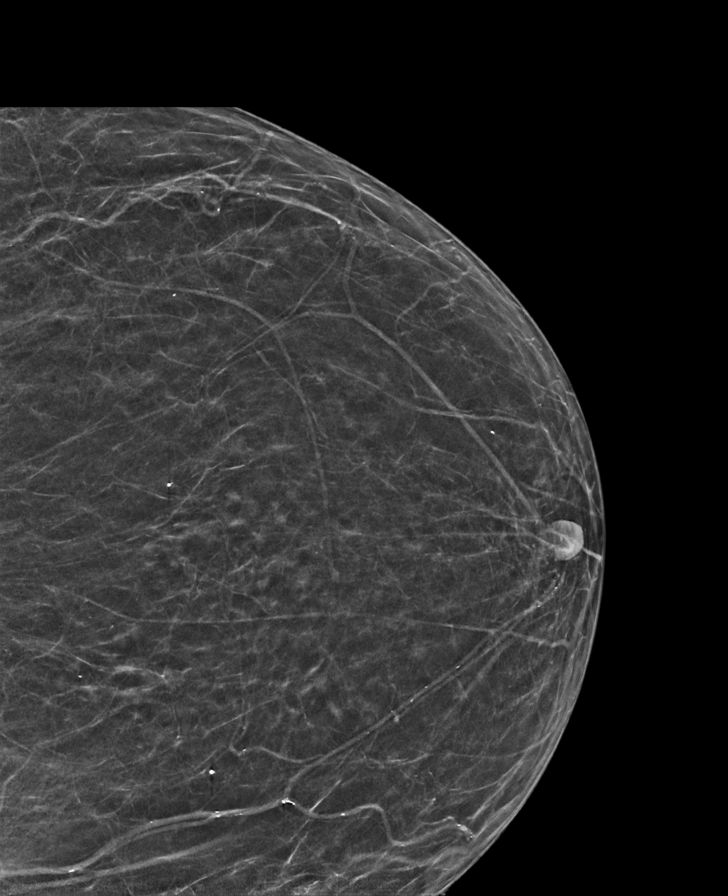

[L CV synth-2D]
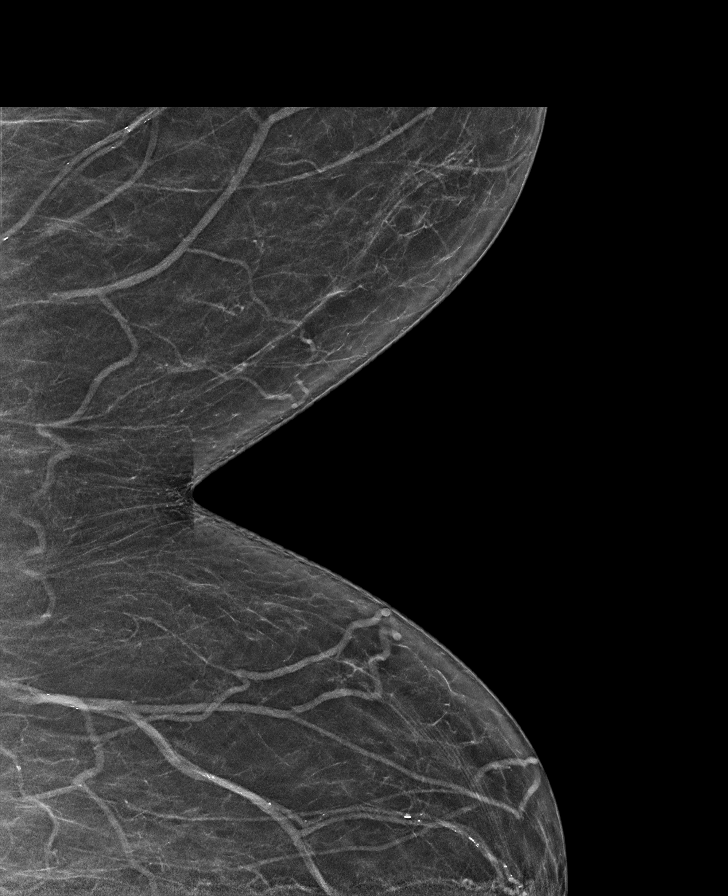

[R MLO synth-2D (1 of 2)]
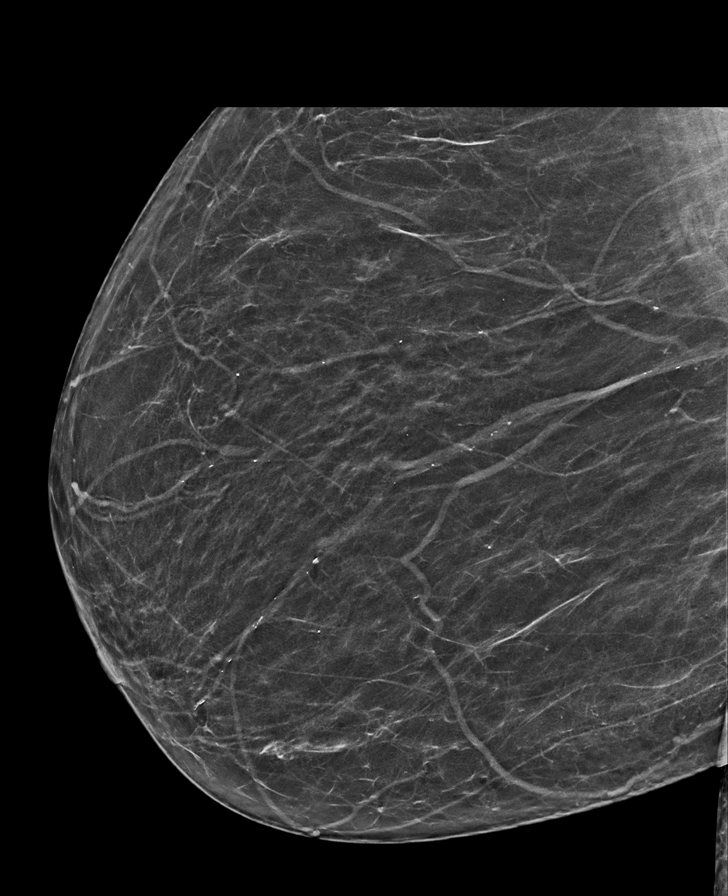

[L MLO synth-2D (2 of 2)]
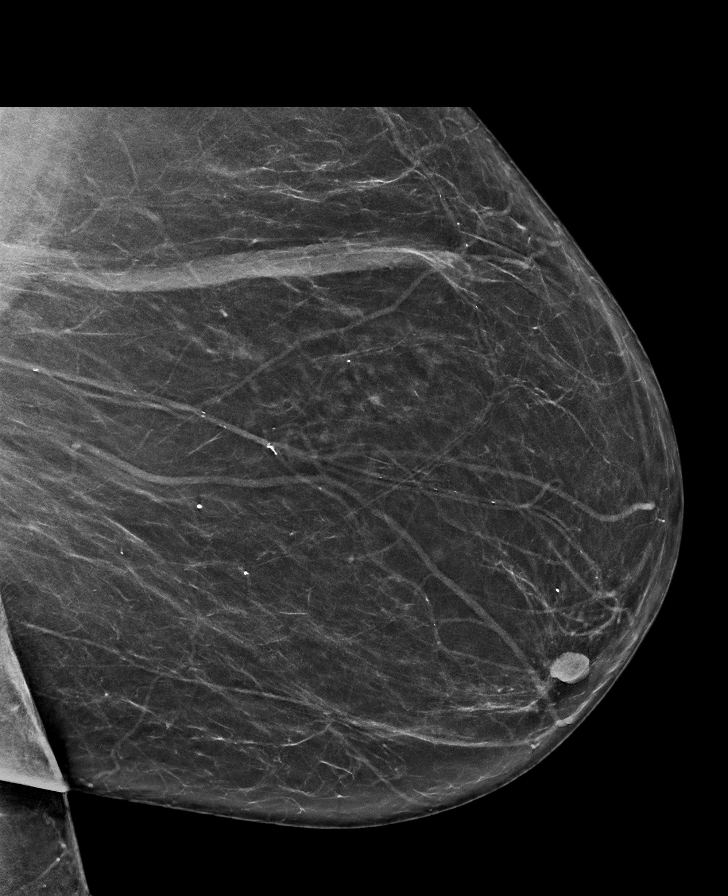

[R CC synth-2D]
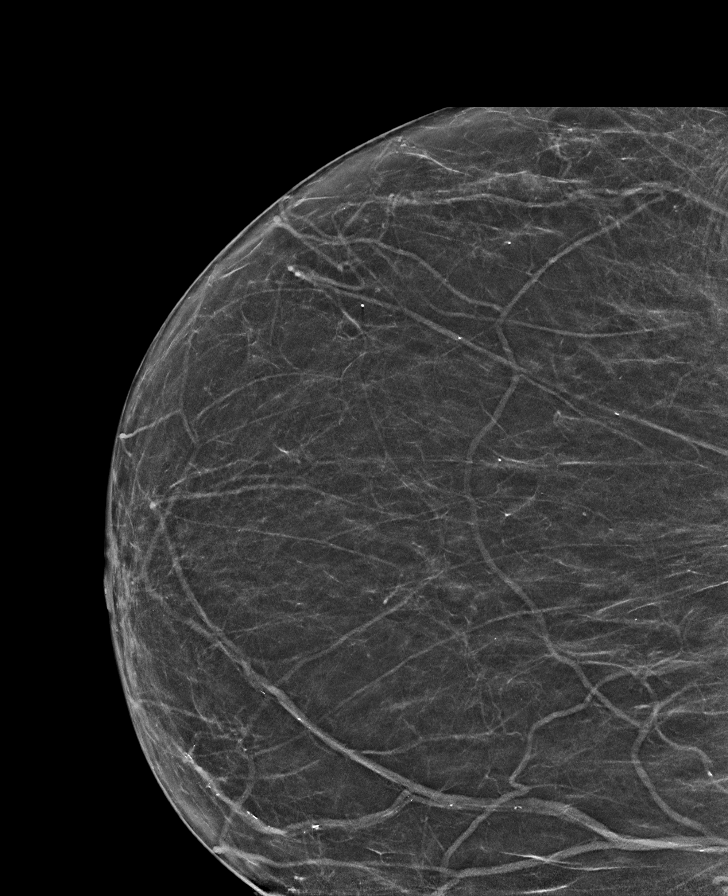

[R MLO synth-2D (2 of 2)]
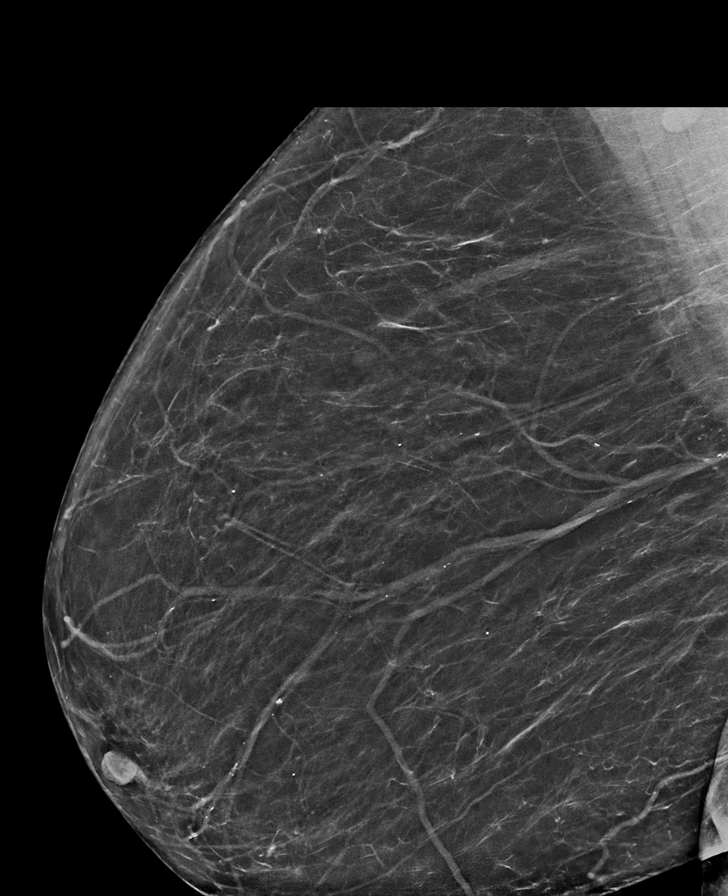

[R MLO tomo · tomo slice 41/80.0]
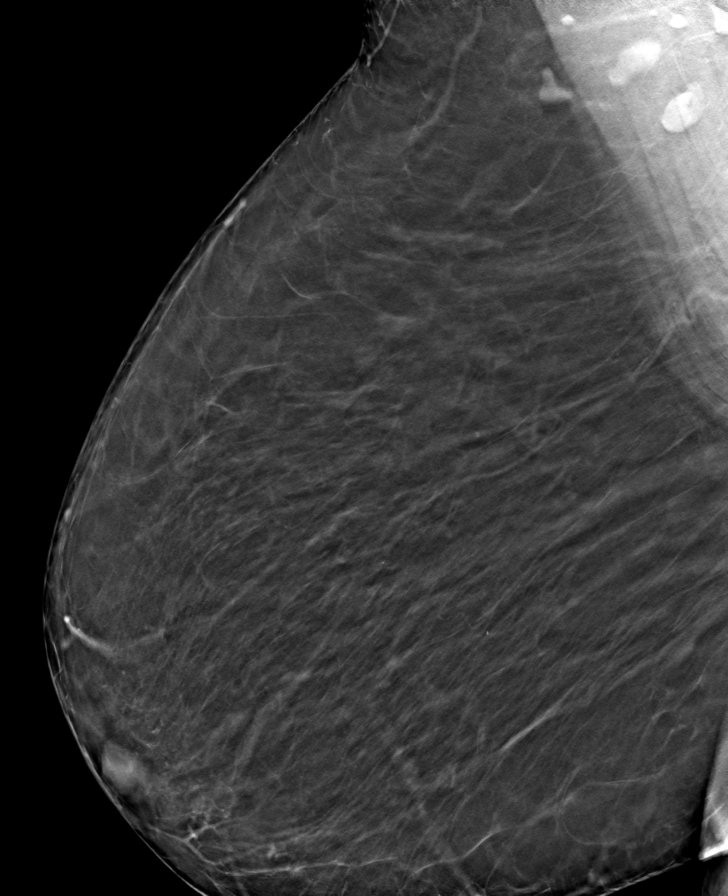

[8 of 40 positions shown; findings below may reference images not displayed]

ACR Breast Density Category b: There are scattered areas of
fibroglandular density.
FINDINGS: There are no findings suspicious for malignancy. Images were
processed with CAD.
IMPRESSION: No mammographic evidence of malignancy. A result letter of this
screening mammogram will be mailed directly to the patient.

RECOMMENDATION:
Screening mammogram in one year. (Code:[id number])

BI-RADS CATEGORY  1: Negative.
